# Patient Record
Sex: Female | Born: 1940 | Race: White | Hispanic: No | Marital: Married | Smoking: Current every day smoker
Health system: Southern US, Community
[De-identification: ages and names within clinical notes are randomized; demographics above are authoritative.]

## PROBLEM LIST (undated history)

## (undated) VITALS — BP 135/62 | HR 85 | Temp 98.7°F | Resp 17 | Wt 183.6 lb

## (undated) VITALS — BP 118/47 | HR 76 | Temp 98.1°F | Resp 16

## (undated) VITALS — BP 113/54 | HR 78 | Temp 98.1°F | Resp 16

## (undated) VITALS — BP 126/56 | HR 89 | Temp 98.8°F | Resp 26 | Wt 169.8 lb

## (undated) VITALS — BP 148/73 | HR 87 | Temp 98.4°F | Resp 22 | Wt 162.0 lb

## (undated) DIAGNOSIS — J449 Chronic obstructive pulmonary disease, unspecified: Secondary | ICD-10-CM

## (undated) DIAGNOSIS — R7881 Bacteremia: Secondary | ICD-10-CM

## (undated) DIAGNOSIS — F329 Major depressive disorder, single episode, unspecified: Secondary | ICD-10-CM

## (undated) DIAGNOSIS — D649 Anemia, unspecified: Secondary | ICD-10-CM

## (undated) DIAGNOSIS — Z720 Tobacco use: Secondary | ICD-10-CM

## (undated) DIAGNOSIS — I38 Endocarditis, valve unspecified: Secondary | ICD-10-CM

## (undated) DIAGNOSIS — R011 Cardiac murmur, unspecified: Secondary | ICD-10-CM

## (undated) DIAGNOSIS — Z5189 Encounter for other specified aftercare: Secondary | ICD-10-CM

## (undated) DIAGNOSIS — R410 Disorientation, unspecified: Secondary | ICD-10-CM

## (undated) DIAGNOSIS — F32A Depression, unspecified: Secondary | ICD-10-CM

## (undated) DIAGNOSIS — R899 Unspecified abnormal finding in specimens from other organs, systems and tissues: Principal | ICD-10-CM

## (undated) DIAGNOSIS — B370 Candidal stomatitis: Secondary | ICD-10-CM

## (undated) DIAGNOSIS — M549 Dorsalgia, unspecified: Secondary | ICD-10-CM

## (undated) DIAGNOSIS — J440 Chronic obstructive pulmonary disease with acute lower respiratory infection: Principal | ICD-10-CM

## (undated) DIAGNOSIS — M1990 Unspecified osteoarthritis, unspecified site: Secondary | ICD-10-CM

## (undated) DIAGNOSIS — G8929 Other chronic pain: Secondary | ICD-10-CM

## (undated) DIAGNOSIS — D759 Disease of blood and blood-forming organs, unspecified: Secondary | ICD-10-CM

## (undated) DIAGNOSIS — M48061 Spinal stenosis, lumbar region without neurogenic claudication: Secondary | ICD-10-CM

## (undated) DIAGNOSIS — E876 Hypokalemia: Secondary | ICD-10-CM

## (undated) DIAGNOSIS — Z9289 Personal history of other medical treatment: Secondary | ICD-10-CM

## (undated) DIAGNOSIS — IMO0001 Reserved for inherently not codable concepts without codable children: Secondary | ICD-10-CM

## (undated) DIAGNOSIS — G062 Extradural and subdural abscess, unspecified: Secondary | ICD-10-CM

## (undated) DIAGNOSIS — Z862 Personal history of diseases of the blood and blood-forming organs and certain disorders involving the immune mechanism: Secondary | ICD-10-CM

## (undated) HISTORY — PX: BACK SURGERY: SHX140

## (undated) HISTORY — DX: Chronic obstructive pulmonary disease with (acute) lower respiratory infection: J44.0

## (undated) HISTORY — DX: Unspecified abnormal finding in specimens from other organs, systems and tissues: R89.9

## (undated) HISTORY — PX: EYE SURGERY: SHX253

## (undated) HISTORY — DX: Tobacco use: Z72.0

## (undated) HISTORY — PX: TONSILLECTOMY: SUR1361

## (undated) HISTORY — PX: CATARACT EXTRACTION W/ INTRAOCULAR LENS  IMPLANT, BILATERAL: SHX1307

## (undated) HISTORY — PX: TUBAL LIGATION: SHX77

---

## 2003-05-22 ENCOUNTER — Other Ambulatory Visit: Payer: Self-pay

## 2003-06-02 ENCOUNTER — Encounter (HOSPITAL_COMMUNITY): Admission: RE | Admit: 2003-06-02 | Discharge: 2003-08-31 | Payer: Self-pay | Admitting: Oncology

## 2003-08-22 ENCOUNTER — Ambulatory Visit (HOSPITAL_COMMUNITY): Admission: RE | Admit: 2003-08-22 | Discharge: 2003-08-22 | Payer: Self-pay | Admitting: Thoracic Surgery

## 2004-03-08 ENCOUNTER — Ambulatory Visit: Payer: Self-pay | Admitting: Oncology

## 2004-08-30 ENCOUNTER — Ambulatory Visit: Payer: Self-pay | Admitting: Oncology

## 2004-11-05 ENCOUNTER — Ambulatory Visit: Payer: Self-pay

## 2004-12-08 ENCOUNTER — Ambulatory Visit: Payer: Self-pay | Admitting: Oncology

## 2004-12-08 ENCOUNTER — Inpatient Hospital Stay (HOSPITAL_COMMUNITY): Admission: AD | Admit: 2004-12-08 | Discharge: 2004-12-18 | Payer: Self-pay | Admitting: Oncology

## 2004-12-10 ENCOUNTER — Ambulatory Visit: Payer: Self-pay | Admitting: Oncology

## 2004-12-20 ENCOUNTER — Encounter (HOSPITAL_COMMUNITY): Admission: RE | Admit: 2004-12-20 | Discharge: 2005-02-17 | Payer: Self-pay | Admitting: Oncology

## 2005-01-18 ENCOUNTER — Ambulatory Visit: Payer: Self-pay | Admitting: Oncology

## 2005-01-26 ENCOUNTER — Ambulatory Visit: Payer: Self-pay | Admitting: Oncology

## 2005-02-06 ENCOUNTER — Other Ambulatory Visit: Payer: Self-pay

## 2005-02-06 ENCOUNTER — Observation Stay: Payer: Self-pay | Admitting: Internal Medicine

## 2005-02-15 ENCOUNTER — Encounter (HOSPITAL_COMMUNITY): Admission: RE | Admit: 2005-02-15 | Discharge: 2005-05-16 | Payer: Self-pay | Admitting: Oncology

## 2005-02-21 ENCOUNTER — Inpatient Hospital Stay (HOSPITAL_COMMUNITY): Admission: EM | Admit: 2005-02-21 | Discharge: 2005-02-22 | Payer: Self-pay | Admitting: *Deleted

## 2005-02-21 ENCOUNTER — Ambulatory Visit: Payer: Self-pay | Admitting: Oncology

## 2005-02-23 ENCOUNTER — Inpatient Hospital Stay (HOSPITAL_COMMUNITY): Admission: EM | Admit: 2005-02-23 | Discharge: 2005-02-28 | Payer: Self-pay | Admitting: Oncology

## 2005-03-02 ENCOUNTER — Inpatient Hospital Stay (HOSPITAL_COMMUNITY): Admission: AD | Admit: 2005-03-02 | Discharge: 2005-03-06 | Payer: Self-pay | Admitting: Oncology

## 2005-03-14 ENCOUNTER — Ambulatory Visit: Payer: Self-pay | Admitting: Oncology

## 2005-03-18 ENCOUNTER — Ambulatory Visit (HOSPITAL_COMMUNITY): Admission: RE | Admit: 2005-03-18 | Discharge: 2005-03-18 | Payer: Self-pay | Admitting: Oncology

## 2005-05-03 ENCOUNTER — Ambulatory Visit: Payer: Self-pay | Admitting: Oncology

## 2005-05-18 ENCOUNTER — Ambulatory Visit (HOSPITAL_COMMUNITY): Admission: RE | Admit: 2005-05-18 | Discharge: 2005-05-18 | Payer: Self-pay | Admitting: Vascular Surgery

## 2005-05-23 ENCOUNTER — Ambulatory Visit (HOSPITAL_COMMUNITY): Admission: RE | Admit: 2005-05-23 | Discharge: 2005-05-23 | Payer: Self-pay | Admitting: Vascular Surgery

## 2005-05-27 ENCOUNTER — Ambulatory Visit: Payer: Self-pay | Admitting: Oncology

## 2005-07-11 ENCOUNTER — Ambulatory Visit (HOSPITAL_COMMUNITY): Admission: RE | Admit: 2005-07-11 | Discharge: 2005-07-11 | Payer: Self-pay | Admitting: Oncology

## 2005-07-11 ENCOUNTER — Ambulatory Visit: Payer: Self-pay | Admitting: Oncology

## 2005-07-12 ENCOUNTER — Ambulatory Visit: Payer: Self-pay | Admitting: Psychiatry

## 2005-08-05 ENCOUNTER — Ambulatory Visit: Payer: Self-pay | Admitting: Oncology

## 2005-09-08 ENCOUNTER — Ambulatory Visit: Payer: Self-pay | Admitting: Oncology

## 2005-09-12 LAB — PROTIME-INR
INR: 1 — ABNORMAL LOW (ref 2.00–3.50)
Protime: 12.5 Seconds (ref 10.6–13.4)

## 2005-09-12 LAB — CBC & DIFF AND RETIC
BASO%: 0.5 % (ref 0.0–2.0)
EOS%: 0.7 % (ref 0.0–7.0)
LYMPH%: 14.6 % (ref 14.0–48.0)
MCH: 26.3 pg (ref 26.0–34.0)
MCHC: 33.6 g/dL (ref 32.0–36.0)
MCV: 78.1 fL — ABNORMAL LOW (ref 81.0–101.0)
MONO%: 6.1 % (ref 0.0–13.0)
NEUT#: 6.2 10*3/uL (ref 1.5–6.5)
RBC: 4.97 10*6/uL (ref 3.70–5.32)
RDW: 20.3 % — ABNORMAL HIGH (ref 11.3–14.5)
RETIC #: 98.4 10*3/uL (ref 19.7–115.1)
Retic %: 2 % (ref 0.4–2.3)

## 2005-09-12 LAB — COMPREHENSIVE METABOLIC PANEL
ALT: 20 U/L (ref 0–40)
AST: 18 U/L (ref 0–37)
Albumin: 4.6 g/dL (ref 3.5–5.2)
Alkaline Phosphatase: 79 U/L (ref 39–117)
Calcium: 10.3 mg/dL (ref 8.4–10.5)
Chloride: 104 mEq/L (ref 96–112)
Potassium: 4.8 mEq/L (ref 3.5–5.3)

## 2005-10-10 ENCOUNTER — Ambulatory Visit: Payer: Self-pay | Admitting: Oncology

## 2005-10-10 LAB — CBC WITH DIFFERENTIAL (CANCER CENTER ONLY)
BASO#: 0.1 10*3/uL (ref 0.0–0.2)
Eosinophils Absolute: 0.2 10*3/uL (ref 0.0–0.5)
HCT: 37.7 % (ref 34.8–46.6)
HGB: 12.4 g/dL (ref 11.6–15.9)
LYMPH#: 2.2 10*3/uL (ref 0.9–3.3)
MCH: 25.9 pg — ABNORMAL LOW (ref 26.0–34.0)
NEUT#: 2.3 10*3/uL (ref 1.5–6.5)
NEUT%: 43 % (ref 39.6–80.0)
RBC: 4.78 10*6/uL (ref 3.70–5.32)

## 2005-10-11 LAB — RETICULOCYTES (CHCC)
ABS Retic: 47.9 10*3/uL (ref 19.0–186.0)
RBC.: 4.79 MIL/uL (ref 3.87–5.11)
Retic Ct Pct: 1 % (ref 0.4–3.1)

## 2005-11-01 LAB — CBC WITH DIFFERENTIAL (CANCER CENTER ONLY)
BASO#: 0 10*3/uL (ref 0.0–0.2)
BASO%: 0.7 % (ref 0.0–2.0)
EOS%: 4.7 % (ref 0.0–7.0)
HCT: 38.4 % (ref 34.8–46.6)
HGB: 12.5 g/dL (ref 11.6–15.9)
LYMPH#: 2.1 10*3/uL (ref 0.9–3.3)
MCHC: 32.6 g/dL (ref 32.0–36.0)
MONO#: 0.3 10*3/uL (ref 0.1–0.9)
NEUT#: 2.9 10*3/uL (ref 1.5–6.5)
RBC: 4.58 10*6/uL (ref 3.70–5.32)
WBC: 5.6 10*3/uL (ref 3.9–10.0)

## 2005-11-01 LAB — COMPREHENSIVE METABOLIC PANEL
Alkaline Phosphatase: 79 U/L (ref 39–117)
Creatinine, Ser: 1.12 mg/dL (ref 0.40–1.20)
Glucose, Bld: 111 mg/dL — ABNORMAL HIGH (ref 70–99)
Sodium: 145 mEq/L (ref 135–145)
Total Bilirubin: 0.4 mg/dL (ref 0.3–1.2)
Total Protein: 7.2 g/dL (ref 6.0–8.3)

## 2005-11-01 LAB — RETICULOCYTES (CHCC)
ABS Retic: 50.5 10*3/uL (ref 19.0–186.0)
Retic Ct Pct: 1.1 % (ref 0.4–3.1)

## 2005-12-02 ENCOUNTER — Ambulatory Visit: Payer: Self-pay | Admitting: Oncology

## 2005-12-06 LAB — CBC WITH DIFFERENTIAL (CANCER CENTER ONLY)
BASO%: 1.1 % (ref 0.0–2.0)
EOS%: 3.8 % (ref 0.0–7.0)
HCT: 36.9 % (ref 34.8–46.6)
LYMPH%: 43.9 % (ref 14.0–48.0)
MCHC: 32.9 g/dL (ref 32.0–36.0)
MCV: 83 fL (ref 81–101)
MONO%: 7.4 % (ref 0.0–13.0)
NEUT%: 43.8 % (ref 39.6–80.0)
Platelets: 222 10*3/uL (ref 145–400)
RDW: 14.5 % (ref 10.5–14.6)
WBC: 6.9 10*3/uL (ref 3.9–10.0)

## 2005-12-15 ENCOUNTER — Ambulatory Visit: Payer: Self-pay

## 2006-01-03 LAB — CBC WITH DIFFERENTIAL (CANCER CENTER ONLY)
BASO%: 0.7 % (ref 0.0–2.0)
EOS%: 4.5 % (ref 0.0–7.0)
LYMPH#: 2.6 10*3/uL (ref 0.9–3.3)
MCHC: 33 g/dL (ref 32.0–36.0)
MONO#: 0.3 10*3/uL (ref 0.1–0.9)
NEUT#: 2.7 10*3/uL (ref 1.5–6.5)
NEUT%: 45.7 % (ref 39.6–80.0)
Platelets: 216 10*3/uL (ref 145–400)
RDW: 14.7 % — ABNORMAL HIGH (ref 10.5–14.6)
WBC: 5.9 10*3/uL (ref 3.9–10.0)

## 2006-01-09 ENCOUNTER — Ambulatory Visit: Payer: Self-pay | Admitting: Oncology

## 2006-06-14 ENCOUNTER — Ambulatory Visit: Payer: Self-pay | Admitting: Oncology

## 2006-06-14 LAB — CBC & DIFF AND RETIC
Basophils Absolute: 0 10*3/uL (ref 0.0–0.1)
EOS%: 4.4 % (ref 0.0–7.0)
Eosinophils Absolute: 0.3 10*3/uL (ref 0.0–0.5)
HGB: 15.1 g/dL (ref 11.6–15.9)
LYMPH%: 35.8 % (ref 14.0–48.0)
MCH: 31 pg (ref 26.0–34.0)
MCV: 87.1 fL (ref 81.0–101.0)
MONO%: 6.5 % (ref 0.0–13.0)
NEUT#: 4.1 10*3/uL (ref 1.5–6.5)
Platelets: 137 10*3/uL — ABNORMAL LOW (ref 145–400)
RBC: 4.88 10*6/uL (ref 3.70–5.32)
RDW: 14.1 % (ref 11.3–14.5)
RETIC #: 77.1 10*3/uL (ref 19.7–115.1)

## 2006-06-14 LAB — COMPREHENSIVE METABOLIC PANEL
Albumin: 4.6 g/dL (ref 3.5–5.2)
Alkaline Phosphatase: 68 U/L (ref 39–117)
BUN: 20 mg/dL (ref 6–23)
CO2: 25 mEq/L (ref 19–32)
Glucose, Bld: 114 mg/dL — ABNORMAL HIGH (ref 70–99)
Potassium: 4.9 mEq/L (ref 3.5–5.3)
Sodium: 142 mEq/L (ref 135–145)
Total Protein: 7.1 g/dL (ref 6.0–8.3)

## 2006-06-14 LAB — MORPHOLOGY: PLT EST: DECREASED

## 2006-06-14 LAB — LACTATE DEHYDROGENASE: LDH: 220 U/L (ref 94–250)

## 2006-06-20 ENCOUNTER — Ambulatory Visit: Payer: Self-pay | Admitting: Oncology

## 2006-06-21 LAB — CBC WITH DIFFERENTIAL (CANCER CENTER ONLY)
BASO#: 0.1 10*3/uL (ref 0.0–0.2)
Eosinophils Absolute: 0.4 10*3/uL (ref 0.0–0.5)
HGB: 15.6 g/dL (ref 11.6–15.9)
LYMPH%: 49.2 % — ABNORMAL HIGH (ref 14.0–48.0)
MCH: 30.9 pg (ref 26.0–34.0)
MCV: 90 fL (ref 81–101)
MONO%: 6 % (ref 0.0–13.0)
NEUT%: 37.7 % — ABNORMAL LOW (ref 39.6–80.0)
RBC: 5.04 10*6/uL (ref 3.70–5.32)

## 2006-06-21 LAB — RETICULOCYTES (CHCC): ABS Retic: 61.2 10*3/uL (ref 19.0–186.0)

## 2006-06-21 LAB — MORPHOLOGY - CHCC SATELLITE
Platelet Morphology: NORMAL
RBC Comments: NORMAL

## 2006-06-28 LAB — CBC WITH DIFFERENTIAL (CANCER CENTER ONLY)
BASO%: 0.7 % (ref 0.0–2.0)
Eosinophils Absolute: 0.5 10*3/uL (ref 0.0–0.5)
LYMPH%: 41.1 % (ref 14.0–48.0)
MCH: 30.9 pg (ref 26.0–34.0)
MCHC: 34.1 g/dL (ref 32.0–36.0)
MCV: 91 fL (ref 81–101)
MONO%: 5.3 % (ref 0.0–13.0)
Platelets: 131 10*3/uL — ABNORMAL LOW (ref 145–400)
RDW: 12.7 % (ref 10.5–14.6)

## 2006-06-28 LAB — MORPHOLOGY - CHCC SATELLITE
PLT EST ~~LOC~~: DECREASED
RBC Comments: NORMAL

## 2006-06-28 LAB — RETICULOCYTES (CHCC): RBC.: 4.99 MIL/uL (ref 3.87–5.11)

## 2006-07-03 LAB — CBC WITH DIFFERENTIAL/PLATELET
Basophils Absolute: 0 10*3/uL (ref 0.0–0.1)
EOS%: 5.6 % (ref 0.0–7.0)
Eosinophils Absolute: 0.4 10*3/uL (ref 0.0–0.5)
HCT: 42.4 % (ref 34.8–46.6)
HGB: 14.6 g/dL (ref 11.6–15.9)
LYMPH%: 40.1 % (ref 14.0–48.0)
MCH: 30.4 pg (ref 26.0–34.0)
MCV: 88.3 fL (ref 81.0–101.0)
MONO%: 7.5 % (ref 0.0–13.0)
NEUT#: 3.7 10*3/uL (ref 1.5–6.5)
NEUT%: 46.5 % (ref 39.6–76.8)
Platelets: 175 10*3/uL (ref 145–400)
RDW: 14 % (ref 11.3–14.5)

## 2006-07-03 LAB — LACTATE DEHYDROGENASE: LDH: 196 U/L (ref 94–250)

## 2006-07-03 LAB — COMPREHENSIVE METABOLIC PANEL
ALT: 12 U/L (ref 0–35)
AST: 16 U/L (ref 0–37)
Alkaline Phosphatase: 70 U/L (ref 39–117)
CO2: 29 mEq/L (ref 19–32)
Creatinine, Ser: 1.03 mg/dL (ref 0.40–1.20)
Total Bilirubin: 0.4 mg/dL (ref 0.3–1.2)

## 2006-07-03 LAB — MORPHOLOGY

## 2006-07-03 LAB — CHCC SMEAR

## 2006-07-18 ENCOUNTER — Ambulatory Visit: Payer: Self-pay | Admitting: Ophthalmology

## 2006-08-03 ENCOUNTER — Ambulatory Visit: Payer: Self-pay | Admitting: Oncology

## 2006-08-07 LAB — CBC WITH DIFFERENTIAL (CANCER CENTER ONLY)
BASO%: 0.9 % (ref 0.0–2.0)
LYMPH%: 44.5 % (ref 14.0–48.0)
MCV: 92 fL (ref 81–101)
MONO#: 0.4 10*3/uL (ref 0.1–0.9)
Platelets: 190 10*3/uL (ref 145–400)
RDW: 12.1 % (ref 10.5–14.6)
WBC: 7.5 10*3/uL (ref 3.9–10.0)

## 2006-08-07 LAB — RETICULOCYTES (CHCC): ABS Retic: 67.1 10*3/uL (ref 19.0–186.0)

## 2006-08-21 LAB — CBC WITH DIFFERENTIAL (CANCER CENTER ONLY)
Eosinophils Absolute: 0.6 10*3/uL — ABNORMAL HIGH (ref 0.0–0.5)
HCT: 44.8 % (ref 34.8–46.6)
HGB: 15.1 g/dL (ref 11.6–15.9)
LYMPH%: 41.6 % (ref 14.0–48.0)
MCV: 92 fL (ref 81–101)
MONO#: 0.4 10*3/uL (ref 0.1–0.9)
NEUT%: 44.4 % (ref 39.6–80.0)
RBC: 4.86 10*6/uL (ref 3.70–5.32)
WBC: 7.6 10*3/uL (ref 3.9–10.0)

## 2006-08-21 LAB — RETICULOCYTES (CHCC)
RBC.: 4.9 MIL/uL (ref 3.87–5.11)
Retic Ct Pct: 1.3 % (ref 0.4–3.1)

## 2006-09-04 LAB — RETICULOCYTES (CHCC): Retic Ct Pct: 1.4 % (ref 0.4–3.1)

## 2006-09-04 LAB — CBC WITH DIFFERENTIAL (CANCER CENTER ONLY)
BASO#: 0 10*3/uL (ref 0.0–0.2)
BASO%: 0.7 % (ref 0.0–2.0)
EOS%: 8.2 % — ABNORMAL HIGH (ref 0.0–7.0)
HCT: 44 % (ref 34.8–46.6)
HGB: 15.1 g/dL (ref 11.6–15.9)
LYMPH%: 41.2 % (ref 14.0–48.0)
MCHC: 34.2 g/dL (ref 32.0–36.0)
MCV: 92 fL (ref 81–101)
MONO#: 0.4 10*3/uL (ref 0.1–0.9)
NEUT%: 44.2 % (ref 39.6–80.0)
RDW: 12 % (ref 10.5–14.6)

## 2006-09-15 ENCOUNTER — Ambulatory Visit: Payer: Self-pay | Admitting: Oncology

## 2006-09-20 LAB — CBC WITH DIFFERENTIAL (CANCER CENTER ONLY)
BASO#: 0.1 10*3/uL (ref 0.0–0.2)
BASO%: 0.9 % (ref 0.0–2.0)
EOS%: 7.9 % — ABNORMAL HIGH (ref 0.0–7.0)
HCT: 45.7 % (ref 34.8–46.6)
LYMPH#: 2.9 10*3/uL (ref 0.9–3.3)
LYMPH%: 44.1 % (ref 14.0–48.0)
MCH: 31.2 pg (ref 26.0–34.0)
MCHC: 34.1 g/dL (ref 32.0–36.0)
MONO%: 6.2 % (ref 0.0–13.0)
NEUT%: 40.9 % (ref 39.6–80.0)
RDW: 11.9 % (ref 10.5–14.6)

## 2006-09-20 LAB — RETICULOCYTES (CHCC): RBC.: 5.13 MIL/uL — ABNORMAL HIGH (ref 3.87–5.11)

## 2006-10-02 LAB — CBC WITH DIFFERENTIAL (CANCER CENTER ONLY)
BASO%: 0.6 % (ref 0.0–2.0)
EOS%: 7.5 % — ABNORMAL HIGH (ref 0.0–7.0)
HGB: 15 g/dL (ref 11.6–15.9)
LYMPH#: 2.9 10*3/uL (ref 0.9–3.3)
MCH: 31.2 pg (ref 26.0–34.0)
MCHC: 34.2 g/dL (ref 32.0–36.0)
MONO%: 4.6 % (ref 0.0–13.0)
NEUT#: 2.3 10*3/uL (ref 1.5–6.5)
Platelets: 181 10*3/uL (ref 145–400)
RDW: 11.9 % (ref 10.5–14.6)

## 2006-10-02 LAB — RETICULOCYTES (CHCC)
ABS Retic: 53.6 10*3/uL (ref 19.0–186.0)
RBC.: 4.87 MIL/uL (ref 3.87–5.11)

## 2006-10-16 LAB — CBC WITH DIFFERENTIAL (CANCER CENTER ONLY)
BASO#: 0.1 10*3/uL (ref 0.0–0.2)
Eosinophils Absolute: 0.4 10*3/uL (ref 0.0–0.5)
HGB: 14.7 g/dL (ref 11.6–15.9)
LYMPH#: 3 10*3/uL (ref 0.9–3.3)
MONO%: 6.5 % (ref 0.0–13.0)
NEUT#: 2.6 10*3/uL (ref 1.5–6.5)
Platelets: 195 10*3/uL (ref 145–400)
RBC: 4.69 10*6/uL (ref 3.70–5.32)
WBC: 6.5 10*3/uL (ref 3.9–10.0)

## 2006-10-16 LAB — RETICULOCYTES (CHCC): ABS Retic: 52.4 10*3/uL (ref 19.0–186.0)

## 2006-10-26 ENCOUNTER — Ambulatory Visit: Payer: Self-pay | Admitting: Oncology

## 2006-10-30 LAB — CBC WITH DIFFERENTIAL (CANCER CENTER ONLY)
BASO#: 0.1 10*3/uL (ref 0.0–0.2)
Eosinophils Absolute: 0.4 10*3/uL (ref 0.0–0.5)
HGB: 14.8 g/dL (ref 11.6–15.9)
LYMPH%: 43.8 % (ref 14.0–48.0)
MCH: 31.4 pg (ref 26.0–34.0)
MCV: 92 fL (ref 81–101)
MONO#: 0.4 10*3/uL (ref 0.1–0.9)
MONO%: 6.6 % (ref 0.0–13.0)
NEUT#: 2.6 10*3/uL (ref 1.5–6.5)
Platelets: 218 10*3/uL (ref 145–400)
RBC: 4.71 10*6/uL (ref 3.70–5.32)
WBC: 6.2 10*3/uL (ref 3.9–10.0)

## 2006-11-13 ENCOUNTER — Ambulatory Visit: Payer: Self-pay | Admitting: Oncology

## 2006-11-13 LAB — COMPREHENSIVE METABOLIC PANEL
Alkaline Phosphatase: 52 U/L (ref 39–117)
BUN: 23 mg/dL (ref 6–23)
Creatinine, Ser: 1.06 mg/dL (ref 0.40–1.20)
Glucose, Bld: 88 mg/dL (ref 70–99)
Total Bilirubin: 0.5 mg/dL (ref 0.3–1.2)

## 2006-11-13 LAB — CBC & DIFF AND RETIC
Basophils Absolute: 0 10*3/uL (ref 0.0–0.1)
Eosinophils Absolute: 0.4 10*3/uL (ref 0.0–0.5)
HGB: 15.4 g/dL (ref 11.6–15.9)
IRF: 0.36 — ABNORMAL HIGH (ref 0.130–0.330)
MCV: 90.6 fL (ref 81.0–101.0)
MONO%: 7.7 % (ref 0.0–13.0)
NEUT#: 3 10*3/uL (ref 1.5–6.5)
RDW: 13.1 % (ref 11.3–14.5)
RETIC #: 70.9 10*3/uL (ref 19.7–115.1)

## 2006-11-13 LAB — MORPHOLOGY: RBC Comments: NORMAL

## 2006-11-13 LAB — CHCC SMEAR

## 2006-12-11 ENCOUNTER — Ambulatory Visit: Payer: Self-pay | Admitting: Pain Medicine

## 2006-12-14 ENCOUNTER — Ambulatory Visit: Payer: Self-pay | Admitting: Pain Medicine

## 2006-12-15 ENCOUNTER — Ambulatory Visit: Payer: Self-pay | Admitting: Pain Medicine

## 2007-01-09 ENCOUNTER — Ambulatory Visit: Payer: Self-pay | Admitting: Physician Assistant

## 2007-01-11 ENCOUNTER — Ambulatory Visit: Payer: Self-pay

## 2007-02-15 ENCOUNTER — Ambulatory Visit: Payer: Self-pay | Admitting: Oncology

## 2007-02-19 LAB — CBC & DIFF AND RETIC
BASO%: 0.5 % (ref 0.0–2.0)
Eosinophils Absolute: 0.5 10*3/uL (ref 0.0–0.5)
LYMPH%: 44.8 % (ref 14.0–48.0)
MCHC: 35.5 g/dL (ref 32.0–36.0)
MCV: 90.4 fL (ref 81.0–101.0)
MONO%: 4.9 % (ref 0.0–13.0)
Platelets: 197 10*3/uL (ref 145–400)
RBC: 4.53 10*6/uL (ref 3.70–5.32)
Retic %: 1.8 % (ref 0.4–2.3)

## 2007-02-19 LAB — MORPHOLOGY
PLT EST: ADEQUATE
RBC Comments: NORMAL

## 2007-03-21 ENCOUNTER — Ambulatory Visit: Payer: Self-pay | Admitting: Pain Medicine

## 2007-04-10 ENCOUNTER — Ambulatory Visit: Payer: Self-pay | Admitting: Pain Medicine

## 2007-05-17 ENCOUNTER — Ambulatory Visit: Payer: Self-pay | Admitting: Pain Medicine

## 2007-05-17 ENCOUNTER — Ambulatory Visit: Payer: Self-pay | Admitting: Oncology

## 2007-05-21 LAB — CBC & DIFF AND RETIC
BASO%: 0.4 % (ref 0.0–2.0)
HCT: 41.4 % (ref 34.8–46.6)
IRF: 0.42 — ABNORMAL HIGH (ref 0.130–0.330)
MCHC: 35.4 g/dL (ref 32.0–36.0)
MONO#: 0.9 10*3/uL (ref 0.1–0.9)
NEUT%: 58.8 % (ref 39.6–76.8)
RDW: 12.6 % (ref 11.3–14.5)
RETIC #: 96.4 10*3/uL (ref 19.7–115.1)
Retic %: 2.1 % (ref 0.4–2.3)
WBC: 9.2 10*3/uL (ref 3.9–10.0)
lymph#: 2.8 10*3/uL (ref 0.9–3.3)

## 2007-05-21 LAB — CHCC SMEAR

## 2007-05-21 LAB — COMPREHENSIVE METABOLIC PANEL
ALT: 14 U/L (ref 0–35)
AST: 13 U/L (ref 0–37)
Alkaline Phosphatase: 43 U/L (ref 39–117)
CO2: 23 mEq/L (ref 19–32)
Sodium: 140 mEq/L (ref 135–145)
Total Bilirubin: 0.5 mg/dL (ref 0.3–1.2)
Total Protein: 7.2 g/dL (ref 6.0–8.3)

## 2007-05-21 LAB — MORPHOLOGY

## 2007-05-31 ENCOUNTER — Ambulatory Visit: Payer: Self-pay | Admitting: Physician Assistant

## 2007-08-06 ENCOUNTER — Ambulatory Visit: Payer: Self-pay | Admitting: Pain Medicine

## 2007-08-07 ENCOUNTER — Ambulatory Visit: Payer: Self-pay | Admitting: Oncology

## 2007-08-09 ENCOUNTER — Ambulatory Visit: Payer: Self-pay | Admitting: Pain Medicine

## 2007-08-15 LAB — CBC WITH DIFFERENTIAL (CANCER CENTER ONLY)
Eosinophils Absolute: 0.3 10*3/uL (ref 0.0–0.5)
HCT: 44.2 % (ref 34.8–46.6)
LYMPH%: 38.1 % (ref 14.0–48.0)
MCH: 30.8 pg (ref 26.0–34.0)
MCV: 90 fL (ref 81–101)
MONO#: 0.6 10*3/uL (ref 0.1–0.9)
MONO%: 7.4 % (ref 0.0–13.0)
NEUT%: 49.7 % (ref 39.6–80.0)
Platelets: 218 10*3/uL (ref 145–400)
RBC: 4.89 10*6/uL (ref 3.70–5.32)
RDW: 11.7 % (ref 10.5–14.6)
WBC: 8.2 10*3/uL (ref 3.9–10.0)

## 2007-08-15 LAB — RETICULOCYTES (CHCC)
ABS Retic: 74.3 10*3/uL (ref 19.0–186.0)
Retic Ct Pct: 1.5 % (ref 0.4–3.1)

## 2007-08-15 LAB — MORPHOLOGY - CHCC SATELLITE
Platelet Morphology: NORMAL
RBC Comments: NORMAL

## 2007-08-23 ENCOUNTER — Ambulatory Visit: Payer: Self-pay | Admitting: Pain Medicine

## 2007-09-11 ENCOUNTER — Ambulatory Visit: Payer: Self-pay | Admitting: Physician Assistant

## 2007-10-29 ENCOUNTER — Ambulatory Visit: Payer: Self-pay | Admitting: Physician Assistant

## 2007-11-08 ENCOUNTER — Ambulatory Visit: Payer: Self-pay | Admitting: Oncology

## 2007-11-09 LAB — CBC WITH DIFFERENTIAL (CANCER CENTER ONLY)
BASO#: 0.1 10*3/uL (ref 0.0–0.2)
Eosinophils Absolute: 0.1 10*3/uL (ref 0.0–0.5)
HCT: 42.6 % (ref 34.8–46.6)
HGB: 14.4 g/dL (ref 11.6–15.9)
LYMPH%: 28.9 % (ref 14.0–48.0)
MCH: 30.3 pg (ref 26.0–34.0)
MCV: 90 fL (ref 81–101)
MONO#: 0.6 10*3/uL (ref 0.1–0.9)
MONO%: 6.5 % (ref 0.0–13.0)
RBC: 4.74 10*6/uL (ref 3.70–5.32)
WBC: 8.6 10*3/uL (ref 3.9–10.0)

## 2007-11-09 LAB — RETICULOCYTES (CHCC): Retic Ct Pct: 1.7 % (ref 0.4–3.1)

## 2007-11-09 LAB — MORPHOLOGY
PLT EST: ADEQUATE
RBC Comments: NORMAL

## 2007-11-27 ENCOUNTER — Ambulatory Visit: Payer: Self-pay | Admitting: Physician Assistant

## 2008-02-05 ENCOUNTER — Ambulatory Visit: Payer: Self-pay | Admitting: Family Medicine

## 2008-02-14 ENCOUNTER — Ambulatory Visit: Payer: Self-pay | Admitting: Oncology

## 2008-02-18 LAB — COMPREHENSIVE METABOLIC PANEL
CO2: 22 mEq/L (ref 19–32)
Creatinine, Ser: 0.91 mg/dL (ref 0.40–1.20)
Glucose, Bld: 89 mg/dL (ref 70–99)
Total Bilirubin: 0.5 mg/dL (ref 0.3–1.2)

## 2008-02-18 LAB — CBC & DIFF AND RETIC
BASO%: 0.5 % (ref 0.0–2.0)
EOS%: 4.5 % (ref 0.0–7.0)
HCT: 44 % (ref 34.8–46.6)
LYMPH%: 42.3 % (ref 14.0–48.0)
MCH: 32.3 pg (ref 26.0–34.0)
MCHC: 35.2 g/dL (ref 32.0–36.0)
NEUT%: 45.2 % (ref 39.6–76.8)
Platelets: 197 10*3/uL (ref 145–400)

## 2008-02-18 LAB — CHCC SMEAR

## 2008-02-18 LAB — MORPHOLOGY

## 2008-02-18 LAB — LACTATE DEHYDROGENASE: LDH: 174 U/L (ref 94–250)

## 2008-02-21 ENCOUNTER — Ambulatory Visit: Payer: Self-pay | Admitting: Physician Assistant

## 2008-04-08 ENCOUNTER — Ambulatory Visit: Payer: Self-pay | Admitting: Ophthalmology

## 2008-04-21 ENCOUNTER — Ambulatory Visit: Payer: Self-pay | Admitting: Ophthalmology

## 2008-05-22 ENCOUNTER — Ambulatory Visit: Payer: Self-pay | Admitting: Physician Assistant

## 2008-08-25 ENCOUNTER — Ambulatory Visit: Payer: Self-pay | Admitting: Physician Assistant

## 2008-08-26 ENCOUNTER — Ambulatory Visit: Payer: Self-pay | Admitting: Gastroenterology

## 2008-10-31 ENCOUNTER — Ambulatory Visit: Payer: Self-pay | Admitting: Unknown Physician Specialty

## 2008-11-20 ENCOUNTER — Ambulatory Visit: Payer: Self-pay | Admitting: Physician Assistant

## 2009-02-06 ENCOUNTER — Ambulatory Visit: Payer: Self-pay | Admitting: Family Medicine

## 2009-02-12 ENCOUNTER — Ambulatory Visit: Payer: Self-pay | Admitting: Oncology

## 2009-02-16 LAB — COMPREHENSIVE METABOLIC PANEL
AST: 17 U/L (ref 0–37)
BUN: 21 mg/dL (ref 6–23)
Calcium: 10.5 mg/dL (ref 8.4–10.5)
Chloride: 104 mEq/L (ref 96–112)
Creatinine, Ser: 1.05 mg/dL (ref 0.40–1.20)
Total Bilirubin: 0.4 mg/dL (ref 0.3–1.2)

## 2009-02-16 LAB — CBC & DIFF AND RETIC
BASO%: 0.6 % (ref 0.0–2.0)
EOS%: 4.4 % (ref 0.0–7.0)
MCH: 31.7 pg (ref 25.1–34.0)
MCHC: 34.1 g/dL (ref 31.5–36.0)
NEUT%: 48.7 % (ref 38.4–76.8)
RBC: 4.98 10*6/uL (ref 3.70–5.45)
RDW: 13.3 % (ref 11.2–14.5)
Retic %: 1.28 % (ref 0.50–1.50)
Retic Ct Abs: 63.74 10*3/uL (ref 18.30–72.70)
lymph#: 2.5 10*3/uL (ref 0.9–3.3)
nRBC: 0 % (ref 0–0)

## 2009-02-16 LAB — CHCC SMEAR

## 2009-02-16 LAB — LACTATE DEHYDROGENASE: LDH: 163 U/L (ref 94–250)

## 2009-02-16 LAB — MORPHOLOGY
PLT EST: ADEQUATE
RBC Comments: NORMAL

## 2009-02-19 ENCOUNTER — Ambulatory Visit: Payer: Self-pay | Admitting: Physician Assistant

## 2009-05-19 ENCOUNTER — Ambulatory Visit: Payer: Self-pay | Admitting: Physician Assistant

## 2009-06-11 ENCOUNTER — Ambulatory Visit: Payer: Self-pay | Admitting: Pain Medicine

## 2009-06-23 ENCOUNTER — Ambulatory Visit: Payer: Self-pay | Admitting: Pain Medicine

## 2009-09-10 ENCOUNTER — Ambulatory Visit: Payer: Self-pay | Admitting: Pain Medicine

## 2010-02-11 ENCOUNTER — Ambulatory Visit: Payer: Self-pay | Admitting: Oncology

## 2010-02-15 LAB — CBC & DIFF AND RETIC
BASO%: 0.6 % (ref 0.0–2.0)
EOS%: 5.7 % (ref 0.0–7.0)
Immature Retic Fract: 9.9 % (ref 0.00–10.70)
MCH: 31.9 pg (ref 25.1–34.0)
MCHC: 34.7 g/dL (ref 31.5–36.0)
NEUT%: 51.9 % (ref 38.4–76.8)
RDW: 13.2 % (ref 11.2–14.5)
Retic Ct Abs: 100.82 10*3/uL — ABNORMAL HIGH (ref 18.30–72.70)
lymph#: 2.7 10*3/uL (ref 0.9–3.3)

## 2010-02-15 LAB — CHCC SMEAR

## 2010-02-15 LAB — LACTATE DEHYDROGENASE: LDH: 338 U/L — ABNORMAL HIGH (ref 94–250)

## 2010-02-15 LAB — COMPREHENSIVE METABOLIC PANEL
ALT: 15 U/L (ref 0–35)
CO2: 27 mEq/L (ref 19–32)
Creatinine, Ser: 1.11 mg/dL (ref 0.40–1.20)
Total Bilirubin: 0.6 mg/dL (ref 0.3–1.2)

## 2010-02-15 LAB — MORPHOLOGY

## 2010-02-16 ENCOUNTER — Inpatient Hospital Stay (HOSPITAL_COMMUNITY): Admission: AD | Admit: 2010-02-16 | Discharge: 2010-02-18 | Payer: Self-pay | Admitting: Oncology

## 2010-02-16 ENCOUNTER — Ambulatory Visit: Payer: Self-pay | Admitting: Vascular Surgery

## 2010-02-16 ENCOUNTER — Ambulatory Visit: Payer: Self-pay | Admitting: Oncology

## 2010-02-19 ENCOUNTER — Encounter (HOSPITAL_COMMUNITY)
Admission: RE | Admit: 2010-02-19 | Discharge: 2010-05-20 | Payer: Self-pay | Source: Home / Self Care | Attending: Oncology | Admitting: Oncology

## 2010-03-15 ENCOUNTER — Ambulatory Visit: Payer: Self-pay | Admitting: Oncology

## 2010-03-16 LAB — CBC & DIFF AND RETIC
Basophils Absolute: 0 10*3/uL (ref 0.0–0.1)
EOS%: 0 % (ref 0.0–7.0)
Eosinophils Absolute: 0 10*3/uL (ref 0.0–0.5)
HCT: 31.9 % — ABNORMAL LOW (ref 34.8–46.6)
HGB: 10.3 g/dL — ABNORMAL LOW (ref 11.6–15.9)
LYMPH%: 8.2 % — ABNORMAL LOW (ref 14.0–49.7)
MCH: 31.7 pg (ref 25.1–34.0)
MCV: 98.2 fL (ref 79.5–101.0)
MONO%: 10.8 % (ref 0.0–14.0)
NEUT#: 7.3 10*3/uL — ABNORMAL HIGH (ref 1.5–6.5)
NEUT%: 80.9 % — ABNORMAL HIGH (ref 38.4–76.8)
Platelets: 162 10*3/uL (ref 145–400)
Retic Ct Abs: 111.8 10*3/uL — ABNORMAL HIGH (ref 18.30–72.70)

## 2010-03-16 LAB — MORPHOLOGY: PLT EST: ADEQUATE

## 2010-03-23 LAB — CBC & DIFF AND RETIC
Basophils Absolute: 0 10*3/uL (ref 0.0–0.1)
EOS%: 0 % (ref 0.0–7.0)
Eosinophils Absolute: 0 10*3/uL (ref 0.0–0.5)
HCT: 34.2 % — ABNORMAL LOW (ref 34.8–46.6)
HGB: 11.3 g/dL — ABNORMAL LOW (ref 11.6–15.9)
MCH: 31.5 pg (ref 25.1–34.0)
MCV: 95.3 fL (ref 79.5–101.0)
MONO%: 7.2 % (ref 0.0–14.0)
NEUT#: 7.4 10*3/uL — ABNORMAL HIGH (ref 1.5–6.5)
NEUT%: 84.7 % — ABNORMAL HIGH (ref 38.4–76.8)
RDW: 14 % (ref 11.2–14.5)
Retic %: 2.69 % — ABNORMAL HIGH (ref 0.50–1.50)
Retic Ct Abs: 96.57 10*3/uL — ABNORMAL HIGH (ref 18.30–72.70)

## 2010-03-29 ENCOUNTER — Ambulatory Visit (HOSPITAL_COMMUNITY): Admission: RE | Admit: 2010-03-29 | Discharge: 2010-03-29 | Payer: Self-pay | Admitting: Vascular Surgery

## 2010-03-29 ENCOUNTER — Ambulatory Visit: Payer: Self-pay | Admitting: Vascular Surgery

## 2010-03-30 LAB — CBC & DIFF AND RETIC
Basophils Absolute: 0 10*3/uL (ref 0.0–0.1)
Eosinophils Absolute: 0 10*3/uL (ref 0.0–0.5)
HGB: 12.4 g/dL (ref 11.6–15.9)
Immature Retic Fract: 16.6 % — ABNORMAL HIGH (ref 0.00–10.70)
MONO#: 0.7 10*3/uL (ref 0.1–0.9)
NEUT#: 8.6 10*3/uL — ABNORMAL HIGH (ref 1.5–6.5)
Platelets: 113 10*3/uL — ABNORMAL LOW (ref 145–400)
RBC: 4.02 10*6/uL (ref 3.70–5.45)
RDW: 14.1 % (ref 11.2–14.5)
Retic %: 3.13 % — ABNORMAL HIGH (ref 0.50–1.50)
WBC: 10.7 10*3/uL — ABNORMAL HIGH (ref 3.9–10.3)
nRBC: 0 % (ref 0–0)

## 2010-03-31 ENCOUNTER — Ambulatory Visit (HOSPITAL_COMMUNITY)
Admission: AD | Admit: 2010-03-31 | Discharge: 2010-03-31 | Payer: Self-pay | Source: Home / Self Care | Admitting: Vascular Surgery

## 2010-04-06 LAB — CBC & DIFF AND RETIC
BASO%: 0.1 % (ref 0.0–2.0)
EOS%: 0.9 % (ref 0.0–7.0)
MCH: 31.5 pg (ref 25.1–34.0)
MCHC: 32.6 g/dL (ref 31.5–36.0)
MONO%: 7.6 % (ref 0.0–14.0)
RDW: 14.4 % (ref 11.2–14.5)
Retic %: 3 % — ABNORMAL HIGH (ref 0.50–1.50)
Retic Ct Abs: 107.7 10*3/uL — ABNORMAL HIGH (ref 18.30–72.70)
lymph#: 2.2 10*3/uL (ref 0.9–3.3)

## 2010-04-06 LAB — CHCC SMEAR

## 2010-04-06 LAB — MORPHOLOGY: PLT EST: ADEQUATE

## 2010-04-09 ENCOUNTER — Emergency Department (HOSPITAL_COMMUNITY)
Admission: EM | Admit: 2010-04-09 | Discharge: 2010-04-09 | Payer: Self-pay | Source: Home / Self Care | Admitting: Emergency Medicine

## 2010-04-19 ENCOUNTER — Ambulatory Visit: Payer: Self-pay | Admitting: Oncology

## 2010-04-20 LAB — CBC & DIFF AND RETIC
BASO%: 0.1 % (ref 0.0–2.0)
Basophils Absolute: 0 10*3/uL (ref 0.0–0.1)
EOS%: 0.3 % (ref 0.0–7.0)
HGB: 10.5 g/dL — ABNORMAL LOW (ref 11.6–15.9)
Immature Retic Fract: 17.6 % — ABNORMAL HIGH (ref 0.00–10.70)
MCH: 29.9 pg (ref 25.1–34.0)
MONO%: 4.5 % (ref 0.0–14.0)
NEUT#: 10.8 10*3/uL — ABNORMAL HIGH (ref 1.5–6.5)
RBC: 3.51 10*6/uL — ABNORMAL LOW (ref 3.70–5.45)
RDW: 14.9 % — ABNORMAL HIGH (ref 11.2–14.5)
Retic %: 3.71 % — ABNORMAL HIGH (ref 0.50–1.50)
Retic Ct Abs: 130.22 10*3/uL — ABNORMAL HIGH (ref 18.30–72.70)
lymph#: 1 10*3/uL (ref 0.9–3.3)

## 2010-04-20 LAB — MORPHOLOGY: PLT EST: ADEQUATE

## 2010-04-20 LAB — CHCC SMEAR

## 2010-05-06 LAB — CBC
HCT: 33.5 % — ABNORMAL LOW (ref 36.0–46.0)
Hemoglobin: 10.3 g/dL — ABNORMAL LOW (ref 12.0–15.0)
MCH: 28.5 pg (ref 26.0–34.0)
MCHC: 30.7 g/dL (ref 30.0–36.0)
MCV: 92.5 fL (ref 78.0–100.0)
Platelets: 244 10*3/uL (ref 150–400)
RBC: 3.62 MIL/uL — ABNORMAL LOW (ref 3.87–5.11)
RDW: 15.2 % (ref 11.5–15.5)
WBC: 10 10*3/uL (ref 4.0–10.5)

## 2010-05-06 LAB — COMPREHENSIVE METABOLIC PANEL
ALT: 25 U/L (ref 0–35)
AST: 40 U/L — ABNORMAL HIGH (ref 0–37)
Albumin: 3.5 g/dL (ref 3.5–5.2)
Alkaline Phosphatase: 53 U/L (ref 39–117)
BUN: 29 mg/dL — ABNORMAL HIGH (ref 6–23)
CO2: 30 mEq/L (ref 19–32)
Calcium: 10.5 mg/dL (ref 8.4–10.5)
Chloride: 103 mEq/L (ref 96–112)
Creatinine, Ser: 1.24 mg/dL — ABNORMAL HIGH (ref 0.4–1.2)
GFR calc Af Amer: 52 mL/min — ABNORMAL LOW (ref >60)
GFR calc non Af Amer: 43 mL/min — ABNORMAL LOW (ref >60)
Glucose, Bld: 152 mg/dL — ABNORMAL HIGH (ref 70–99)
Potassium: 3.8 mEq/L (ref 3.5–5.1)
Sodium: 142 mEq/L (ref 135–145)
Total Bilirubin: 0.3 mg/dL (ref 0.3–1.2)
Total Protein: 6.1 g/dL (ref 6.0–8.3)

## 2010-05-06 LAB — RETICULOCYTES
RBC.: 3.62 MIL/uL — ABNORMAL LOW (ref 3.87–5.11)
Retic Count, Absolute: 119.5 10*3/uL (ref 19.0–186.0)
Retic Ct Pct: 3.3 % — ABNORMAL HIGH (ref 0.4–3.1)

## 2010-05-06 LAB — DIFFERENTIAL
Basophils Absolute: 0 10*3/uL (ref 0.0–0.1)
Basophils Relative: 0 % (ref 0–1)
Eosinophils Absolute: 0.1 10*3/uL (ref 0.0–0.7)
Eosinophils Relative: 1 % (ref 0–5)
Lymphocytes Relative: 5 % — ABNORMAL LOW (ref 12–46)
Lymphs Abs: 0.5 10*3/uL — ABNORMAL LOW (ref 0.7–4.0)
Monocytes Absolute: 0.5 10*3/uL (ref 0.1–1.0)
Monocytes Relative: 5 % (ref 3–12)
Neutro Abs: 8.9 10*3/uL — ABNORMAL HIGH (ref 1.7–7.7)
Neutrophils Relative %: 89 % — ABNORMAL HIGH (ref 43–77)

## 2010-05-06 LAB — LACTATE DEHYDROGENASE: LDH: 327 U/L — ABNORMAL HIGH (ref 94–250)

## 2010-05-07 LAB — THERAPEUTIC PLASMA EXCHANGE (BLOOD BANK)
Plasma Exchange: 2800
Unit division: 0
Unit division: 0
Unit division: 0
Unit division: 0
Unit division: 0
Unit division: 0
Unit division: 0
Unit division: 0
Unit division: 0

## 2010-05-17 LAB — RETICULOCYTES
RBC.: 3.47 MIL/uL — ABNORMAL LOW (ref 3.87–5.11)
RBC.: 3.27 MIL/uL — ABNORMAL LOW (ref 3.87–5.11)
Retic Count, Absolute: 86.8 10*3/uL (ref 19.0–186.0)
Retic Count, Absolute: 88.3 10*3/uL (ref 19.0–186.0)
Retic Ct Pct: 2.5 % (ref 0.4–3.1)
Retic Ct Pct: 2.7 % (ref 0.4–3.1)

## 2010-05-17 LAB — THERAPEUTIC PLASMA EXCHANGE (BLOOD BANK)
Plasma Exchange: 2800
Plasma Exchange: 2800
Unit division: 0
Unit division: 0
Unit division: 0
Unit division: 0
Unit division: 0
Unit division: 0
Unit division: 0
Unit division: 0
Unit division: 0
Unit division: 0
Unit division: 0
Unit division: 0
Unit division: 0
Unit division: 0
Unit division: 0
Unit division: 0
Unit division: 0
Unit division: 0
Unit division: 0

## 2010-05-17 LAB — CBC
HCT: 32.3 % — ABNORMAL LOW (ref 36.0–46.0)
HCT: 30.2 % — ABNORMAL LOW (ref 36.0–46.0)
Hemoglobin: 9.9 g/dL — ABNORMAL LOW (ref 12.0–15.0)
Hemoglobin: 9.3 g/dL — ABNORMAL LOW (ref 12.0–15.0)
MCH: 28.5 pg (ref 26.0–34.0)
MCH: 28.4 pg (ref 26.0–34.0)
MCHC: 30.7 g/dL (ref 30.0–36.0)
MCHC: 30.8 g/dL (ref 30.0–36.0)
MCV: 93.1 fL (ref 78.0–100.0)
MCV: 92.4 fL (ref 78.0–100.0)
Platelets: 223 10*3/uL (ref 150–400)
Platelets: 245 10*3/uL (ref 150–400)
RBC: 3.47 MIL/uL — ABNORMAL LOW (ref 3.87–5.11)
RBC: 3.27 MIL/uL — ABNORMAL LOW (ref 3.87–5.11)
RDW: 15.3 % (ref 11.5–15.5)
RDW: 15.3 % (ref 11.5–15.5)
WBC: 7.4 10*3/uL (ref 4.0–10.5)
WBC: 9.3 10*3/uL (ref 4.0–10.5)

## 2010-05-17 LAB — COMPREHENSIVE METABOLIC PANEL
ALT: 24 U/L (ref 0–35)
ALT: 25 U/L (ref 0–35)
AST: 30 U/L (ref 0–37)
AST: 30 U/L (ref 0–37)
Albumin: 3.2 g/dL — ABNORMAL LOW (ref 3.5–5.2)
Albumin: 3.4 g/dL — ABNORMAL LOW (ref 3.5–5.2)
Alkaline Phosphatase: 55 U/L (ref 39–117)
Alkaline Phosphatase: 45 U/L (ref 39–117)
BUN: 28 mg/dL — ABNORMAL HIGH (ref 6–23)
BUN: 19 mg/dL (ref 6–23)
CO2: 32 mEq/L (ref 19–32)
CO2: 28 mEq/L (ref 19–32)
Calcium: 9.7 mg/dL (ref 8.4–10.5)
Calcium: 9.6 mg/dL (ref 8.4–10.5)
Chloride: 104 mEq/L (ref 96–112)
Chloride: 100 mEq/L (ref 96–112)
Creatinine, Ser: 1.5 mg/dL — ABNORMAL HIGH (ref 0.4–1.2)
Creatinine, Ser: 1.12 mg/dL (ref 0.4–1.2)
GFR calc Af Amer: 42 mL/min — ABNORMAL LOW (ref >60)
GFR calc Af Amer: 59 mL/min — ABNORMAL LOW (ref >60)
GFR calc non Af Amer: 35 mL/min — ABNORMAL LOW (ref >60)
GFR calc non Af Amer: 49 mL/min — ABNORMAL LOW (ref >60)
Glucose, Bld: 127 mg/dL — ABNORMAL HIGH (ref 70–99)
Glucose, Bld: 155 mg/dL — ABNORMAL HIGH (ref 70–99)
Potassium: 3.9 mEq/L (ref 3.5–5.1)
Potassium: 4.6 mEq/L (ref 3.5–5.1)
Sodium: 146 mEq/L — ABNORMAL HIGH (ref 135–145)
Sodium: 143 mEq/L (ref 135–145)
Total Bilirubin: 0.4 mg/dL (ref 0.3–1.2)
Total Bilirubin: 0.5 mg/dL (ref 0.3–1.2)
Total Protein: 5.8 g/dL — ABNORMAL LOW (ref 6.0–8.3)
Total Protein: 5.9 g/dL — ABNORMAL LOW (ref 6.0–8.3)

## 2010-05-17 LAB — DIFFERENTIAL
Basophils Absolute: 0 10*3/uL (ref 0.0–0.1)
Basophils Absolute: 0 10*3/uL (ref 0.0–0.1)
Basophils Relative: 1 % (ref 0–1)
Basophils Relative: 0 % (ref 0–1)
Eosinophils Absolute: 0.2 10*3/uL (ref 0.0–0.7)
Eosinophils Absolute: 0 10*3/uL (ref 0.0–0.7)
Eosinophils Relative: 2 % (ref 0–5)
Eosinophils Relative: 0 % (ref 0–5)
Lymphocytes Relative: 25 % (ref 12–46)
Lymphocytes Relative: 5 % — ABNORMAL LOW (ref 12–46)
Lymphs Abs: 1.9 10*3/uL (ref 0.7–4.0)
Lymphs Abs: 0.5 10*3/uL — ABNORMAL LOW (ref 0.7–4.0)
Monocytes Absolute: 0.6 10*3/uL (ref 0.1–1.0)
Monocytes Absolute: 0.3 10*3/uL (ref 0.1–1.0)
Monocytes Relative: 8 % (ref 3–12)
Monocytes Relative: 4 % (ref 3–12)
Neutro Abs: 4.8 10*3/uL (ref 1.7–7.7)
Neutro Abs: 8.4 10*3/uL — ABNORMAL HIGH (ref 1.7–7.7)
Neutrophils Relative %: 65 % (ref 43–77)
Neutrophils Relative %: 91 % — ABNORMAL HIGH (ref 43–77)

## 2010-05-17 LAB — LACTATE DEHYDROGENASE
LDH: 283 U/L — ABNORMAL HIGH (ref 94–250)
LDH: 263 U/L — ABNORMAL HIGH (ref 94–250)

## 2010-05-18 LAB — CBC & DIFF AND RETIC
BASO%: 0.2 % (ref 0.0–2.0)
Basophils Absolute: 0 10*3/uL (ref 0.0–0.1)
EOS%: 1 % (ref 0.0–7.0)
Eosinophils Absolute: 0.1 10*3/uL (ref 0.0–0.5)
HCT: 32.3 % — ABNORMAL LOW (ref 34.8–46.6)
HGB: 9.9 g/dL — ABNORMAL LOW (ref 11.6–15.9)
Immature Retic Fract: 24.2 % — ABNORMAL HIGH (ref 0.00–10.70)
LYMPH%: 24.8 % (ref 14.0–49.7)
MCH: 27.7 pg (ref 25.1–34.0)
MCHC: 30.7 g/dL — ABNORMAL LOW (ref 31.5–36.0)
MCV: 90.5 fL (ref 79.5–101.0)
MONO#: 0.6 10*3/uL (ref 0.1–0.9)
MONO%: 7.4 % (ref 0.0–14.0)
NEUT#: 5.8 10*3/uL (ref 1.5–6.5)
NEUT%: 66.6 % (ref 38.4–76.8)
Platelets: 298 10*3/uL (ref 145–400)
RBC: 3.57 10*6/uL — ABNORMAL LOW (ref 3.70–5.45)
RDW: 15.4 % — ABNORMAL HIGH (ref 11.2–14.5)
Retic %: 3.35 % — ABNORMAL HIGH (ref 0.50–1.50)
Retic Ct Abs: 119.6 10*3/uL — ABNORMAL HIGH (ref 18.30–72.70)
WBC: 8.6 10*3/uL (ref 3.9–10.3)
lymph#: 2.1 10*3/uL (ref 0.9–3.3)
nRBC: 0 % (ref 0–0)

## 2010-05-18 LAB — CHCC SMEAR

## 2010-05-20 ENCOUNTER — Encounter (HOSPITAL_COMMUNITY)
Admission: RE | Admit: 2010-05-20 | Discharge: 2010-06-01 | Payer: Self-pay | Source: Home / Self Care | Attending: Oncology | Admitting: Oncology

## 2010-05-24 LAB — THERAPEUTIC PLASMA EXCHANGE (BLOOD BANK)
Unit division: 0
Unit division: 0
Unit division: 0
Unit division: 0
Unit division: 0

## 2010-05-24 LAB — COMPREHENSIVE METABOLIC PANEL
AST: 41 U/L — ABNORMAL HIGH (ref 0–37)
Albumin: 3.2 g/dL — ABNORMAL LOW (ref 3.5–5.2)
Alkaline Phosphatase: 48 U/L (ref 39–117)
BUN: 20 mg/dL (ref 6–23)
Chloride: 108 mEq/L (ref 96–112)
GFR calc Af Amer: >60 mL/min (ref >60)
Potassium: 4.1 mEq/L (ref 3.5–5.1)
Total Bilirubin: 0.3 mg/dL (ref 0.3–1.2)
Total Protein: 5.5 g/dL — ABNORMAL LOW (ref 6.0–8.3)

## 2010-05-24 LAB — DIFFERENTIAL
Basophils Absolute: 0 10*3/uL (ref 0.0–0.1)
Basophils Relative: 0 % (ref 0–1)
Eosinophils Absolute: 0.1 10*3/uL (ref 0.0–0.7)
Eosinophils Relative: 2 % (ref 0–5)
Lymphocytes Relative: 21 % (ref 12–46)
Lymphs Abs: 1.7 10*3/uL (ref 0.7–4.0)
Monocytes Absolute: 0.7 10*3/uL (ref 0.1–1.0)
Monocytes Relative: 8 % (ref 3–12)
Neutro Abs: 5.6 10*3/uL (ref 1.7–7.7)
Neutrophils Relative %: 69 % (ref 43–77)

## 2010-05-24 LAB — CBC
HCT: 29.4 % — ABNORMAL LOW (ref 36.0–46.0)
Hemoglobin: 8.7 g/dL — ABNORMAL LOW (ref 12.0–15.0)
MCV: 91.9 fL (ref 78.0–100.0)
RBC: 3.2 MIL/uL — ABNORMAL LOW (ref 3.87–5.11)
WBC: 8.1 10*3/uL (ref 4.0–10.5)

## 2010-05-24 LAB — RETICULOCYTES
RBC.: 3.2 MIL/uL — ABNORMAL LOW (ref 3.87–5.11)
Retic Count, Absolute: 67.2 10*3/uL (ref 19.0–186.0)

## 2010-05-27 LAB — COMPREHENSIVE METABOLIC PANEL
Albumin: 3.5 g/dL (ref 3.5–5.2)
Alkaline Phosphatase: 92 U/L (ref 39–117)
BUN: 17 mg/dL (ref 6–23)
Creatinine, Ser: 1.01 mg/dL (ref 0.4–1.2)
GFR calc Af Amer: >60 mL/min (ref >60)
Potassium: 4.6 mEq/L (ref 3.5–5.1)
Total Protein: 6.4 g/dL (ref 6.0–8.3)

## 2010-05-27 LAB — CBC
HCT: 25.4 % — ABNORMAL LOW (ref 36.0–46.0)
RBC: 2.82 MIL/uL — ABNORMAL LOW (ref 3.87–5.11)
RDW: 15.9 % — ABNORMAL HIGH (ref 11.5–15.5)
WBC: 6 10*3/uL (ref 4.0–10.5)

## 2010-05-27 LAB — DIFFERENTIAL
Basophils Absolute: 0 10*3/uL (ref 0.0–0.1)
Eosinophils Relative: 1 % (ref 0–5)
Lymphocytes Relative: 19 % (ref 12–46)
Neutro Abs: 4.3 10*3/uL (ref 1.7–7.7)
Neutrophils Relative %: 71 % (ref 43–77)

## 2010-05-27 LAB — RETICULOCYTES
RBC.: 2.82 MIL/uL — ABNORMAL LOW (ref 3.87–5.11)
Retic Count, Absolute: 59.2 10*3/uL (ref 19.0–186.0)

## 2010-05-28 LAB — THERAPEUTIC PLASMA EXCHANGE (BLOOD BANK)
Plasma Exchange: 2800
Unit division: 0
Unit division: 0
Unit division: 0
Unit division: 0
Unit division: 0

## 2010-05-28 LAB — CROSSMATCH
ABO/RH(D): O POS
Antibody Screen: NEGATIVE

## 2010-06-02 ENCOUNTER — Encounter (HOSPITAL_COMMUNITY): Admission: RE | Admit: 2010-06-02 | Payer: Medicare Other | Source: Ambulatory Visit | Admitting: Oncology

## 2010-06-02 ENCOUNTER — Ambulatory Visit (HOSPITAL_COMMUNITY)
Admission: RE | Admit: 2010-06-02 | Discharge: 2010-06-02 | Disposition: A | Discharge status: Home / Self Care | Payer: Medicare Other | Source: Ambulatory Visit | Attending: Oncology | Admitting: Oncology

## 2010-06-02 DIAGNOSIS — G7000 Myasthenia gravis without (acute) exacerbation: Secondary | ICD-10-CM | POA: Insufficient documentation

## 2010-06-04 LAB — CBC
Hemoglobin: 10 g/dL — ABNORMAL LOW (ref 12.0–15.0)
RBC: 3.89 MIL/uL (ref 3.87–5.11)
WBC: 4 10*3/uL (ref 4.0–10.5)

## 2010-06-04 LAB — DIFFERENTIAL
Eosinophils Relative: 3 % (ref 0–5)
Lymphocytes Relative: 17 % (ref 12–46)
Lymphs Abs: 0.7 10*3/uL (ref 0.7–4.0)

## 2010-06-07 ENCOUNTER — Ambulatory Visit (HOSPITAL_COMMUNITY)
Admission: RE | Admit: 2010-06-07 | Discharge: 2010-06-07 | Disposition: A | Discharge status: Home / Self Care | Payer: Medicare Other | Source: Ambulatory Visit | Attending: *Deleted | Admitting: *Deleted

## 2010-06-07 DIAGNOSIS — G7000 Myasthenia gravis without (acute) exacerbation: Secondary | ICD-10-CM | POA: Insufficient documentation

## 2010-06-09 ENCOUNTER — Ambulatory Visit (HOSPITAL_COMMUNITY)
Admission: RE | Admit: 2010-06-09 | Discharge: 2010-06-09 | Disposition: A | Discharge status: Home / Self Care | Payer: 59 | Source: Ambulatory Visit

## 2010-06-11 ENCOUNTER — Ambulatory Visit (HOSPITAL_COMMUNITY)
Admission: RE | Admit: 2010-06-11 | Discharge: 2010-06-11 | Disposition: A | Discharge status: Home / Self Care | Payer: 59 | Source: Ambulatory Visit

## 2010-06-11 LAB — DIFFERENTIAL
Basophils Relative: 2 % — ABNORMAL HIGH (ref 0–1)
Lymphocytes Relative: 40 % (ref 12–46)
Lymphs Abs: 2.2 10*3/uL (ref 0.7–4.0)
Monocytes Relative: 20 % — ABNORMAL HIGH (ref 3–12)
Neutro Abs: 2 10*3/uL (ref 1.7–7.7)
Neutrophils Relative %: 36 % — ABNORMAL LOW (ref 43–77)

## 2010-06-11 LAB — CBC
Hemoglobin: 10.7 g/dL — ABNORMAL LOW (ref 12.0–15.0)
MCH: 25.6 pg — ABNORMAL LOW (ref 26.0–34.0)
MCV: 84.7 fL (ref 78.0–100.0)
RBC: 4.18 MIL/uL (ref 3.87–5.11)

## 2010-06-14 ENCOUNTER — Ambulatory Visit (HOSPITAL_COMMUNITY)
Admission: RE | Admit: 2010-06-14 | Discharge: 2010-06-14 | Disposition: A | Discharge status: Home / Self Care | Payer: 59 | Source: Ambulatory Visit

## 2010-06-14 ENCOUNTER — Other Ambulatory Visit: Payer: Self-pay | Admitting: Oncology

## 2010-06-14 ENCOUNTER — Encounter (HOSPITAL_BASED_OUTPATIENT_CLINIC_OR_DEPARTMENT_OTHER): Payer: Medicare Other | Admitting: Oncology

## 2010-06-14 DIAGNOSIS — D6949 Other primary thrombocytopenia: Secondary | ICD-10-CM

## 2010-06-14 DIAGNOSIS — M311 Thrombotic microangiopathy: Secondary | ICD-10-CM

## 2010-06-14 LAB — COMPREHENSIVE METABOLIC PANEL
BUN: 17 mg/dL (ref 6–23)
CO2: 29 mEq/L (ref 19–32)
Creatinine, Ser: 1.09 mg/dL (ref 0.40–1.20)
Glucose, Bld: 150 mg/dL — ABNORMAL HIGH (ref 70–99)
Total Bilirubin: 0.3 mg/dL (ref 0.3–1.2)

## 2010-06-14 LAB — CBC WITH DIFFERENTIAL/PLATELET
BASO%: 0.6 % (ref 0.0–2.0)
Eosinophils Absolute: 0 10*3/uL (ref 0.0–0.5)
HCT: 35.2 % (ref 34.8–46.6)
LYMPH%: 20.3 % (ref 14.0–49.7)
MCHC: 31.9 g/dL (ref 31.5–36.0)
MONO#: 0.4 10*3/uL (ref 0.1–0.9)
NEUT%: 71.9 % (ref 38.4–76.8)
Platelets: 317 10*3/uL (ref 145–400)
WBC: 5.9 10*3/uL (ref 3.9–10.3)

## 2010-06-14 LAB — LACTATE DEHYDROGENASE: LDH: 271 U/L — ABNORMAL HIGH (ref 94–250)

## 2010-06-16 ENCOUNTER — Ambulatory Visit (HOSPITAL_COMMUNITY): Payer: 59

## 2010-06-18 ENCOUNTER — Encounter (HOSPITAL_COMMUNITY): Payer: Medicare Other

## 2010-06-18 ENCOUNTER — Ambulatory Visit (HOSPITAL_COMMUNITY)
Admission: RE | Admit: 2010-06-18 | Discharge: 2010-06-18 | Disposition: A | Discharge status: Home / Self Care | Payer: 59 | Source: Ambulatory Visit | Attending: Vascular Surgery | Admitting: Vascular Surgery

## 2010-06-18 DIAGNOSIS — I120 Hypertensive chronic kidney disease with stage 5 chronic kidney disease or end stage renal disease: Secondary | ICD-10-CM

## 2010-06-18 DIAGNOSIS — N186 End stage renal disease: Secondary | ICD-10-CM

## 2010-06-18 DIAGNOSIS — Z452 Encounter for adjustment and management of vascular access device: Secondary | ICD-10-CM

## 2010-06-21 ENCOUNTER — Ambulatory Visit (HOSPITAL_COMMUNITY): Payer: 59

## 2010-06-23 ENCOUNTER — Ambulatory Visit (HOSPITAL_COMMUNITY): Payer: 59

## 2010-06-25 ENCOUNTER — Ambulatory Visit (HOSPITAL_COMMUNITY): Payer: 59

## 2010-06-28 ENCOUNTER — Ambulatory Visit (HOSPITAL_COMMUNITY): Payer: 59

## 2010-06-30 ENCOUNTER — Ambulatory Visit (HOSPITAL_COMMUNITY): Payer: Medicare Other

## 2010-07-02 ENCOUNTER — Ambulatory Visit (HOSPITAL_COMMUNITY): Payer: Medicare (Managed Care) | Attending: Oncology

## 2010-07-02 DIAGNOSIS — G7000 Myasthenia gravis without (acute) exacerbation: Secondary | ICD-10-CM | POA: Insufficient documentation

## 2010-07-05 ENCOUNTER — Ambulatory Visit (HOSPITAL_COMMUNITY): Payer: Medicare (Managed Care)

## 2010-07-07 ENCOUNTER — Ambulatory Visit (HOSPITAL_COMMUNITY): Payer: Medicare (Managed Care)

## 2010-07-09 ENCOUNTER — Ambulatory Visit (HOSPITAL_COMMUNITY): Payer: Medicare (Managed Care)

## 2010-07-12 ENCOUNTER — Ambulatory Visit (HOSPITAL_COMMUNITY): Payer: Medicare (Managed Care)

## 2010-07-12 LAB — THERAPEUTIC PLASMA EXCHANGE (BLOOD BANK)
Plasma Exchange: 2800
Plasma Exchange: 2800
Plasma Exchange: 2800
Plasma Exchange: 2800
Plasma Exchange: 2800
Unit division: 0
Unit division: 0
Unit division: 0
Unit division: 0
Unit division: 0
Unit division: 0
Unit division: 0
Unit division: 0
Unit division: 0
Unit division: 0
Unit division: 0
Unit division: 0
Unit division: 0
Unit division: 0
Unit division: 0
Unit division: 0
Unit division: 0
Unit division: 0
Unit division: 0
Unit division: 0
Unit division: 0
Unit division: 0
Unit division: 0
Unit division: 0
Unit division: 0
Unit division: 0
Unit division: 0
Unit division: 0
Unit division: 0
Unit division: 0
Unit division: 0
Unit division: 0
Unit division: 0
Unit division: 0
Unit division: 0
Unit division: 0
Unit division: 0
Unit division: 0
Unit division: 0
Unit division: 0
Unit division: 0
Unit division: 0
Unit division: 0
Unit division: 0
Unit division: 0
Unit division: 0
Unit division: 0
Unit division: 0
Unit division: 0
Unit division: 0
Unit division: 0
Unit division: 0
Unit division: 0
Unit division: 0
Unit division: 0
Unit division: 0
Unit division: 0
Unit division: 0
Unit division: 0
Unit division: 0
Unit division: 0
Unit division: 0
Unit division: 0
Unit division: 0
Unit division: 0
Unit division: 0

## 2010-07-12 LAB — COMPREHENSIVE METABOLIC PANEL
ALT: 26 U/L (ref 0–35)
ALT: 22 U/L (ref 0–35)
ALT: 23 U/L (ref 0–35)
ALT: 33 U/L (ref 0–35)
AST: 23 U/L (ref 0–37)
AST: 24 U/L (ref 0–37)
AST: 31 U/L (ref 0–37)
AST: 31 U/L (ref 0–37)
AST: 33 U/L (ref 0–37)
Albumin: 3.3 g/dL — ABNORMAL LOW (ref 3.5–5.2)
Albumin: 3.4 g/dL — ABNORMAL LOW (ref 3.5–5.2)
Albumin: 3.7 g/dL (ref 3.5–5.2)
Albumin: 3.3 g/dL — ABNORMAL LOW (ref 3.5–5.2)
Albumin: 3.4 g/dL — ABNORMAL LOW (ref 3.5–5.2)
Albumin: 3.5 g/dL (ref 3.5–5.2)
Albumin: 3.5 g/dL (ref 3.5–5.2)
Albumin: 3.6 g/dL (ref 3.5–5.2)
Albumin: 3.8 g/dL (ref 3.5–5.2)
Alkaline Phosphatase: 47 U/L (ref 39–117)
Alkaline Phosphatase: 50 U/L (ref 39–117)
Alkaline Phosphatase: 51 U/L (ref 39–117)
Alkaline Phosphatase: 47 U/L (ref 39–117)
Alkaline Phosphatase: 49 U/L (ref 39–117)
Alkaline Phosphatase: 50 U/L (ref 39–117)
Alkaline Phosphatase: 61 U/L (ref 39–117)
Alkaline Phosphatase: 64 U/L (ref 39–117)
Alkaline Phosphatase: 65 U/L (ref 39–117)
BUN: 16 mg/dL (ref 6–23)
BUN: 24 mg/dL — ABNORMAL HIGH (ref 6–23)
BUN: 26 mg/dL — ABNORMAL HIGH (ref 6–23)
BUN: 36 mg/dL — ABNORMAL HIGH (ref 6–23)
BUN: 24 mg/dL — ABNORMAL HIGH (ref 6–23)
BUN: 24 mg/dL — ABNORMAL HIGH (ref 6–23)
BUN: 25 mg/dL — ABNORMAL HIGH (ref 6–23)
BUN: 26 mg/dL — ABNORMAL HIGH (ref 6–23)
BUN: 28 mg/dL — ABNORMAL HIGH (ref 6–23)
CO2: 26 mEq/L (ref 19–32)
CO2: 30 mEq/L (ref 19–32)
CO2: 27 mEq/L (ref 19–32)
CO2: 28 mEq/L (ref 19–32)
CO2: 30 mEq/L (ref 19–32)
CO2: 31 mEq/L (ref 19–32)
Calcium: 9.4 mg/dL (ref 8.4–10.5)
Calcium: 9.4 mg/dL (ref 8.4–10.5)
Calcium: 9.4 mg/dL (ref 8.4–10.5)
Calcium: 10.5 mg/dL (ref 8.4–10.5)
Calcium: 9.1 mg/dL (ref 8.4–10.5)
Calcium: 9.5 mg/dL (ref 8.4–10.5)
Chloride: 104 mEq/L (ref 96–112)
Chloride: 104 mEq/L (ref 96–112)
Chloride: 106 mEq/L (ref 96–112)
Chloride: 100 mEq/L (ref 96–112)
Chloride: 101 mEq/L (ref 96–112)
Chloride: 101 mEq/L (ref 96–112)
Chloride: 103 mEq/L (ref 96–112)
Chloride: 99 mEq/L (ref 96–112)
Creatinine, Ser: 0.89 mg/dL (ref 0.4–1.2)
Creatinine, Ser: 0.89 mg/dL (ref 0.4–1.2)
Creatinine, Ser: 1.01 mg/dL (ref 0.4–1.2)
Creatinine, Ser: 1.05 mg/dL (ref 0.4–1.2)
Creatinine, Ser: 1.38 mg/dL — ABNORMAL HIGH (ref 0.4–1.2)
GFR calc Af Amer: >60 mL/min (ref >60)
GFR calc Af Amer: 56 mL/min — ABNORMAL LOW (ref >60)
GFR calc Af Amer: 59 mL/min — ABNORMAL LOW (ref >60)
GFR calc Af Amer: >60 mL/min (ref >60)
GFR calc Af Amer: >60 mL/min (ref >60)
GFR calc non Af Amer: 59 mL/min — ABNORMAL LOW (ref >60)
GFR calc non Af Amer: >60 mL/min (ref >60)
GFR calc non Af Amer: 38 mL/min — ABNORMAL LOW (ref >60)
GFR calc non Af Amer: 52 mL/min — ABNORMAL LOW (ref >60)
GFR calc non Af Amer: 53 mL/min — ABNORMAL LOW (ref >60)
GFR calc non Af Amer: >60 mL/min (ref >60)
Glucose, Bld: 123 mg/dL — ABNORMAL HIGH (ref 70–99)
Glucose, Bld: 142 mg/dL — ABNORMAL HIGH (ref 70–99)
Glucose, Bld: 93 mg/dL (ref 70–99)
Glucose, Bld: 93 mg/dL (ref 70–99)
Glucose, Bld: 97 mg/dL (ref 70–99)
Glucose, Bld: 106 mg/dL — ABNORMAL HIGH (ref 70–99)
Glucose, Bld: 117 mg/dL — ABNORMAL HIGH (ref 70–99)
Glucose, Bld: 84 mg/dL (ref 70–99)
Potassium: 3.4 mEq/L — ABNORMAL LOW (ref 3.5–5.1)
Potassium: 3.9 mEq/L (ref 3.5–5.1)
Potassium: 4.2 mEq/L (ref 3.5–5.1)
Potassium: 3.4 mEq/L — ABNORMAL LOW (ref 3.5–5.1)
Potassium: 3.4 mEq/L — ABNORMAL LOW (ref 3.5–5.1)
Potassium: 3.5 mEq/L (ref 3.5–5.1)
Potassium: 3.5 mEq/L (ref 3.5–5.1)
Potassium: 3.7 mEq/L (ref 3.5–5.1)
Potassium: 4.3 mEq/L (ref 3.5–5.1)
Sodium: 139 mEq/L (ref 135–145)
Sodium: 138 mEq/L (ref 135–145)
Sodium: 138 mEq/L (ref 135–145)
Sodium: 141 mEq/L (ref 135–145)
Sodium: 142 mEq/L (ref 135–145)
Total Bilirubin: 0.3 mg/dL (ref 0.3–1.2)
Total Bilirubin: 0.3 mg/dL (ref 0.3–1.2)
Total Bilirubin: 0.5 mg/dL (ref 0.3–1.2)
Total Bilirubin: 0.5 mg/dL (ref 0.3–1.2)
Total Bilirubin: 0.3 mg/dL (ref 0.3–1.2)
Total Bilirubin: 0.4 mg/dL (ref 0.3–1.2)
Total Bilirubin: 0.5 mg/dL (ref 0.3–1.2)
Total Bilirubin: 0.5 mg/dL (ref 0.3–1.2)
Total Bilirubin: 0.6 mg/dL (ref 0.3–1.2)
Total Protein: 5.6 g/dL — ABNORMAL LOW (ref 6.0–8.3)
Total Protein: 5.8 g/dL — ABNORMAL LOW (ref 6.0–8.3)
Total Protein: 5.9 g/dL — ABNORMAL LOW (ref 6.0–8.3)
Total Protein: 5.9 g/dL — ABNORMAL LOW (ref 6.0–8.3)
Total Protein: 5.9 g/dL — ABNORMAL LOW (ref 6.0–8.3)
Total Protein: 6.1 g/dL (ref 6.0–8.3)
Total Protein: 6.2 g/dL (ref 6.0–8.3)
Total Protein: 6.5 g/dL (ref 6.0–8.3)

## 2010-07-12 LAB — CBC
HCT: 31.3 % — ABNORMAL LOW (ref 36.0–46.0)
HCT: 31.4 % — ABNORMAL LOW (ref 36.0–46.0)
HCT: 32.1 % — ABNORMAL LOW (ref 36.0–46.0)
HCT: 32.6 % — ABNORMAL LOW (ref 36.0–46.0)
HCT: 33.2 % — ABNORMAL LOW (ref 36.0–46.0)
HCT: 33.1 % — ABNORMAL LOW (ref 36.0–46.0)
HCT: 33.9 % — ABNORMAL LOW (ref 36.0–46.0)
HCT: 34.5 % — ABNORMAL LOW (ref 36.0–46.0)
HCT: 35.4 % — ABNORMAL LOW (ref 36.0–46.0)
HCT: 35.6 % — ABNORMAL LOW (ref 36.0–46.0)
Hemoglobin: 10 g/dL — ABNORMAL LOW (ref 12.0–15.0)
Hemoglobin: 10.2 g/dL — ABNORMAL LOW (ref 12.0–15.0)
Hemoglobin: 10.6 g/dL — ABNORMAL LOW (ref 12.0–15.0)
Hemoglobin: 10.7 g/dL — ABNORMAL LOW (ref 12.0–15.0)
Hemoglobin: 10.7 g/dL — ABNORMAL LOW (ref 12.0–15.0)
Hemoglobin: 11 g/dL — ABNORMAL LOW (ref 12.0–15.0)
Hemoglobin: 11.6 g/dL — ABNORMAL LOW (ref 12.0–15.0)
MCH: 28.7 pg (ref 26.0–34.0)
MCH: 29.2 pg (ref 26.0–34.0)
MCH: 29.4 pg (ref 26.0–34.0)
MCH: 30.1 pg (ref 26.0–34.0)
MCH: 30.8 pg (ref 26.0–34.0)
MCH: 30.7 pg (ref 26.0–34.0)
MCH: 31.1 pg (ref 26.0–34.0)
MCH: 31.4 pg (ref 26.0–34.0)
MCHC: 31.3 g/dL (ref 30.0–36.0)
MCHC: 31.9 g/dL (ref 30.0–36.0)
MCHC: 31.5 g/dL (ref 30.0–36.0)
MCHC: 32 g/dL (ref 30.0–36.0)
MCHC: 32.3 g/dL (ref 30.0–36.0)
MCHC: 32.6 g/dL (ref 30.0–36.0)
MCV: 93.3 fL (ref 78.0–100.0)
MCV: 93.6 fL (ref 78.0–100.0)
MCV: 94.4 fL (ref 78.0–100.0)
MCV: 94.9 fL (ref 78.0–100.0)
MCV: 96.2 fL (ref 78.0–100.0)
MCV: 97.1 fL (ref 78.0–100.0)
MCV: 97.7 fL (ref 78.0–100.0)
MCV: 98.3 fL (ref 78.0–100.0)
MCV: 98.3 fL (ref 78.0–100.0)
MCV: 98.5 fL (ref 78.0–100.0)
Platelets: 206 10*3/uL (ref 150–400)
Platelets: 219 10*3/uL (ref 150–400)
Platelets: 102 10*3/uL — ABNORMAL LOW (ref 150–400)
Platelets: 134 10*3/uL — ABNORMAL LOW (ref 150–400)
Platelets: 157 10*3/uL (ref 150–400)
Platelets: 268 10*3/uL (ref 150–400)
RBC: 3.31 MIL/uL — ABNORMAL LOW (ref 3.87–5.11)
RBC: 3.56 MIL/uL — ABNORMAL LOW (ref 3.87–5.11)
RBC: 3.44 MIL/uL — ABNORMAL LOW (ref 3.87–5.11)
RBC: 3.45 MIL/uL — ABNORMAL LOW (ref 3.87–5.11)
RBC: 3.51 MIL/uL — ABNORMAL LOW (ref 3.87–5.11)
RBC: 3.6 MIL/uL — ABNORMAL LOW (ref 3.87–5.11)
RBC: 4.02 MIL/uL (ref 3.87–5.11)
RDW: 14.8 % (ref 11.5–15.5)
RDW: 14.8 % (ref 11.5–15.5)
RDW: 14.9 % (ref 11.5–15.5)
RDW: 15 % (ref 11.5–15.5)
RDW: 15.1 % (ref 11.5–15.5)
RDW: 14.2 % (ref 11.5–15.5)
RDW: 14.6 % (ref 11.5–15.5)
WBC: 10 10*3/uL (ref 4.0–10.5)
WBC: 11.2 10*3/uL — ABNORMAL HIGH (ref 4.0–10.5)
WBC: 9.6 10*3/uL (ref 4.0–10.5)
WBC: 10 10*3/uL (ref 4.0–10.5)
WBC: 8.9 10*3/uL (ref 4.0–10.5)
WBC: 9.1 10*3/uL (ref 4.0–10.5)
WBC: 9.2 10*3/uL (ref 4.0–10.5)
WBC: 9.2 10*3/uL (ref 4.0–10.5)
WBC: 9.3 10*3/uL (ref 4.0–10.5)

## 2010-07-12 LAB — RETICULOCYTES
RBC.: 3.39 MIL/uL — ABNORMAL LOW (ref 3.87–5.11)
RBC.: 3.4 MIL/uL — ABNORMAL LOW (ref 3.87–5.11)
RBC.: 3.43 MIL/uL — ABNORMAL LOW (ref 3.87–5.11)
RBC.: 3.56 MIL/uL — ABNORMAL LOW (ref 3.87–5.11)
RBC.: 3.41 MIL/uL — ABNORMAL LOW (ref 3.87–5.11)
RBC.: 3.44 MIL/uL — ABNORMAL LOW (ref 3.87–5.11)
RBC.: 3.45 MIL/uL — ABNORMAL LOW (ref 3.87–5.11)
RBC.: 3.51 MIL/uL — ABNORMAL LOW (ref 3.87–5.11)
RBC.: 3.72 MIL/uL — ABNORMAL LOW (ref 3.87–5.11)
Retic Count, Absolute: 102 10*3/uL (ref 19.0–186.0)
Retic Count, Absolute: 112.2 10*3/uL (ref 19.0–186.0)
Retic Count, Absolute: 116.6 10*3/uL (ref 19.0–186.0)
Retic Count, Absolute: 117 10*3/uL (ref 19.0–186.0)
Retic Count, Absolute: 128.4 10*3/uL (ref 19.0–186.0)
Retic Count, Absolute: 89.7 10*3/uL (ref 19.0–186.0)
Retic Count, Absolute: 92.9 10*3/uL (ref 19.0–186.0)
Retic Count, Absolute: 96.7 K/uL (ref 19.0–186.0)
Retic Ct Pct: 3.1 % (ref 0.4–3.1)
Retic Ct Pct: 3.3 % — ABNORMAL HIGH (ref 0.4–3.1)
Retic Ct Pct: 3.4 % — ABNORMAL HIGH (ref 0.4–3.1)
Retic Ct Pct: 3.6 % — ABNORMAL HIGH (ref 0.4–3.1)
Retic Ct Pct: 4.2 % — ABNORMAL HIGH (ref 0.4–3.1)
Retic Ct Pct: 2.6 % (ref 0.4–3.1)
Retic Ct Pct: 2.6 % (ref 0.4–3.1)
Retic Ct Pct: 2.9 % (ref 0.4–3.1)
Retic Ct Pct: 3.6 % — ABNORMAL HIGH (ref 0.4–3.1)

## 2010-07-12 LAB — DIFFERENTIAL
Basophils Absolute: 0 10*3/uL (ref 0.0–0.1)
Basophils Absolute: 0 10*3/uL (ref 0.0–0.1)
Basophils Absolute: 0 10*3/uL (ref 0.0–0.1)
Basophils Absolute: 0 10*3/uL (ref 0.0–0.1)
Basophils Absolute: 0 10*3/uL (ref 0.0–0.1)
Basophils Absolute: 0 10*3/uL (ref 0.0–0.1)
Basophils Absolute: 0 10*3/uL (ref 0.0–0.1)
Basophils Absolute: 0 10*3/uL (ref 0.0–0.1)
Basophils Absolute: 0 10*3/uL (ref 0.0–0.1)
Basophils Absolute: 0 10*3/uL (ref 0.0–0.1)
Basophils Relative: 0 % (ref 0–1)
Basophils Relative: 0 % (ref 0–1)
Basophils Relative: 0 % (ref 0–1)
Basophils Relative: 0 % (ref 0–1)
Basophils Relative: 0 % (ref 0–1)
Basophils Relative: 0 % (ref 0–1)
Basophils Relative: 0 % (ref 0–1)
Basophils Relative: 0 % (ref 0–1)
Basophils Relative: 0 % (ref 0–1)
Eosinophils Absolute: 0.1 10*3/uL (ref 0.0–0.7)
Eosinophils Absolute: 0 10*3/uL (ref 0.0–0.7)
Eosinophils Absolute: 0.1 10*3/uL (ref 0.0–0.7)
Eosinophils Absolute: 0.1 10*3/uL (ref 0.0–0.7)
Eosinophils Relative: 1 % (ref 0–5)
Eosinophils Relative: 0 % (ref 0–5)
Eosinophils Relative: 0 % (ref 0–5)
Eosinophils Relative: 0 % (ref 0–5)
Eosinophils Relative: 1 % (ref 0–5)
Eosinophils Relative: 2 % (ref 0–5)
Eosinophils Relative: 2 % (ref 0–5)
Lymphocytes Relative: 11 % — ABNORMAL LOW (ref 12–46)
Lymphocytes Relative: 13 % (ref 12–46)
Lymphocytes Relative: 18 % (ref 12–46)
Lymphocytes Relative: 19 % (ref 12–46)
Lymphocytes Relative: 17 % (ref 12–46)
Lymphocytes Relative: 19 % (ref 12–46)
Lymphocytes Relative: 20 % (ref 12–46)
Lymphs Abs: 1.4 10*3/uL (ref 0.7–4.0)
Lymphs Abs: 1.9 10*3/uL (ref 0.7–4.0)
Lymphs Abs: 1.4 10*3/uL (ref 0.7–4.0)
Lymphs Abs: 1.6 10*3/uL (ref 0.7–4.0)
Lymphs Abs: 1.9 10*3/uL (ref 0.7–4.0)
Monocytes Absolute: 0.7 10*3/uL (ref 0.1–1.0)
Monocytes Absolute: 0.3 10*3/uL (ref 0.1–1.0)
Monocytes Absolute: 0.7 10*3/uL (ref 0.1–1.0)
Monocytes Absolute: 0.7 10*3/uL (ref 0.1–1.0)
Monocytes Absolute: 0.9 10*3/uL (ref 0.1–1.0)
Monocytes Absolute: 1 10*3/uL (ref 0.1–1.0)
Monocytes Absolute: 1.2 10*3/uL — ABNORMAL HIGH (ref 0.1–1.0)
Monocytes Relative: 10 % (ref 3–12)
Monocytes Relative: 6 % (ref 3–12)
Monocytes Relative: 3 % (ref 3–12)
Monocytes Relative: 8 % (ref 3–12)
Monocytes Relative: 9 % (ref 3–12)
Monocytes Relative: 9 % (ref 3–12)
Neutro Abs: 6 10*3/uL (ref 1.7–7.7)
Neutro Abs: 7.5 10*3/uL (ref 1.7–7.7)
Neutro Abs: 8.4 10*3/uL — ABNORMAL HIGH (ref 1.7–7.7)
Neutro Abs: 8.5 10*3/uL — ABNORMAL HIGH (ref 1.7–7.7)
Neutro Abs: 9.6 10*3/uL — ABNORMAL HIGH (ref 1.7–7.7)
Neutro Abs: 5.2 10*3/uL (ref 1.7–7.7)
Neutro Abs: 6 10*3/uL (ref 1.7–7.7)
Neutro Abs: 6.4 10*3/uL (ref 1.7–7.7)
Neutro Abs: 7.1 10*3/uL (ref 1.7–7.7)
Neutro Abs: 7.3 10*3/uL (ref 1.7–7.7)
Neutrophils Relative %: 71 % (ref 43–77)
Neutrophils Relative %: 72 % (ref 43–77)
Neutrophils Relative %: 74 % (ref 43–77)
Neutrophils Relative %: 78 % — ABNORMAL HIGH (ref 43–77)
Neutrophils Relative %: 82 % — ABNORMAL HIGH (ref 43–77)
Neutrophils Relative %: 85 % — ABNORMAL HIGH (ref 43–77)
Neutrophils Relative %: 68 % (ref 43–77)
Neutrophils Relative %: 71 % (ref 43–77)

## 2010-07-12 LAB — LACTATE DEHYDROGENASE
LDH: 271 U/L — ABNORMAL HIGH (ref 94–250)
LDH: 273 U/L — ABNORMAL HIGH (ref 94–250)
LDH: 273 U/L — ABNORMAL HIGH (ref 94–250)
LDH: 280 U/L — ABNORMAL HIGH (ref 94–250)
LDH: 207 U/L (ref 94–250)
LDH: 221 U/L (ref 94–250)
LDH: 224 U/L (ref 94–250)
LDH: 313 U/L — ABNORMAL HIGH (ref 94–250)

## 2010-07-12 LAB — URINALYSIS, MICROSCOPIC ONLY
Glucose, UA: NEGATIVE mg/dL
Hgb urine dipstick: NEGATIVE
Ketones, ur: NEGATIVE mg/dL
Leukocytes, UA: NEGATIVE
Protein, ur: NEGATIVE mg/dL
pH: 8.5 — ABNORMAL HIGH (ref 5.0–8.0)

## 2010-07-12 LAB — TYPE AND SCREEN
ABO/RH(D): O POS
Antibody Screen: NEGATIVE

## 2010-07-12 LAB — TRANSFUSION REACTION: Post RXN DAT IgG: NEGATIVE

## 2010-07-13 LAB — THERAPEUTIC PLASMA EXCHANGE (BLOOD BANK)
Plasma Exchange: 2800
Plasma Exchange: 2900
Plasma Exchange: 2900
Unit division: 0
Unit division: 0
Unit division: 0
Unit division: 0
Unit division: 0
Unit division: 0
Unit division: 0
Unit division: 0
Unit division: 0
Unit division: 0
Unit division: 0
Unit division: 0
Unit division: 0
Unit division: 0
Unit division: 0
Unit division: 0
Unit division: 0
Unit division: 0
Unit division: 0
Unit division: 0
Unit division: 0
Unit division: 0
Unit division: 0
Unit division: 0
Unit division: 0
Unit division: 0
Unit division: 0
Unit division: 0

## 2010-07-13 LAB — COMPREHENSIVE METABOLIC PANEL
ALT: 20 U/L (ref 0–35)
ALT: 23 U/L (ref 0–35)
ALT: 24 U/L (ref 0–35)
AST: 18 U/L (ref 0–37)
AST: 21 U/L (ref 0–37)
AST: 27 U/L (ref 0–37)
Albumin: 3.5 g/dL (ref 3.5–5.2)
Albumin: 3.6 g/dL (ref 3.5–5.2)
Alkaline Phosphatase: 63 U/L (ref 39–117)
BUN: 24 mg/dL — ABNORMAL HIGH (ref 6–23)
CO2: 27 mEq/L (ref 19–32)
CO2: 32 mEq/L (ref 19–32)
Calcium: 10.1 mg/dL (ref 8.4–10.5)
Calcium: 10.3 mg/dL (ref 8.4–10.5)
Calcium: 10.5 mg/dL (ref 8.4–10.5)
Calcium: 9.8 mg/dL (ref 8.4–10.5)
Chloride: 106 mEq/L (ref 96–112)
Creatinine, Ser: 1.1 mg/dL (ref 0.4–1.2)
Creatinine, Ser: 1.12 mg/dL (ref 0.4–1.2)
Creatinine, Ser: 1.18 mg/dL (ref 0.4–1.2)
Creatinine, Ser: 1.46 mg/dL — ABNORMAL HIGH (ref 0.4–1.2)
GFR calc Af Amer: 43 mL/min — ABNORMAL LOW (ref >60)
GFR calc Af Amer: 59 mL/min — ABNORMAL LOW (ref >60)
GFR calc Af Amer: 60 mL/min — ABNORMAL LOW (ref >60)
GFR calc non Af Amer: 36 mL/min — ABNORMAL LOW (ref >60)
GFR calc non Af Amer: 46 mL/min — ABNORMAL LOW (ref >60)
GFR calc non Af Amer: 50 mL/min — ABNORMAL LOW (ref >60)
Glucose, Bld: 133 mg/dL — ABNORMAL HIGH (ref 70–99)
Glucose, Bld: 140 mg/dL — ABNORMAL HIGH (ref 70–99)
Glucose, Bld: 88 mg/dL (ref 70–99)
Potassium: 3.6 mEq/L (ref 3.5–5.1)
Sodium: 139 mEq/L (ref 135–145)
Sodium: 140 mEq/L (ref 135–145)
Sodium: 142 mEq/L (ref 135–145)
Total Bilirubin: 0.4 mg/dL (ref 0.3–1.2)
Total Protein: 6 g/dL (ref 6.0–8.3)
Total Protein: 6.2 g/dL (ref 6.0–8.3)
Total Protein: 6.4 g/dL (ref 6.0–8.3)

## 2010-07-13 LAB — POCT I-STAT 4, (NA,K, GLUC, HGB,HCT): Hemoglobin: 13.9 g/dL (ref 12.0–15.0)

## 2010-07-13 LAB — CBC
HCT: 29.9 % — ABNORMAL LOW (ref 36.0–46.0)
HCT: 30.5 % — ABNORMAL LOW (ref 36.0–46.0)
Hemoglobin: 10 g/dL — ABNORMAL LOW (ref 12.0–15.0)
Hemoglobin: 9.8 g/dL — ABNORMAL LOW (ref 12.0–15.0)
MCH: 31.5 pg (ref 26.0–34.0)
MCH: 31.8 pg (ref 26.0–34.0)
MCH: 32.1 pg (ref 26.0–34.0)
MCHC: 32.6 g/dL (ref 30.0–36.0)
MCHC: 32.8 g/dL (ref 30.0–36.0)
MCHC: 32.8 g/dL (ref 30.0–36.0)
MCHC: 32.9 g/dL (ref 30.0–36.0)
MCV: 99.1 fL (ref 78.0–100.0)
Platelets: 172 10*3/uL (ref 150–400)
Platelets: 180 10*3/uL (ref 150–400)
RBC: 3.25 MIL/uL — ABNORMAL LOW (ref 3.87–5.11)
RDW: 14.4 % (ref 11.5–15.5)
RDW: 14.8 % (ref 11.5–15.5)
RDW: 14.9 % (ref 11.5–15.5)
WBC: 7.9 10*3/uL (ref 4.0–10.5)

## 2010-07-13 LAB — DIFFERENTIAL
Basophils Absolute: 0 10*3/uL (ref 0.0–0.1)
Basophils Relative: 0 % (ref 0–1)
Eosinophils Absolute: 0.3 10*3/uL (ref 0.0–0.7)
Eosinophils Absolute: 0.4 10*3/uL (ref 0.0–0.7)
Eosinophils Relative: 4 % (ref 0–5)
Eosinophils Relative: 5 % (ref 0–5)
Lymphocytes Relative: 14 % (ref 12–46)
Lymphocytes Relative: 30 % (ref 12–46)
Lymphocytes Relative: 38 % (ref 12–46)
Lymphocytes Relative: 43 % (ref 12–46)
Lymphs Abs: 1 10*3/uL (ref 0.7–4.0)
Lymphs Abs: 3.3 10*3/uL (ref 0.7–4.0)
Lymphs Abs: 3.3 10*3/uL (ref 0.7–4.0)
Lymphs Abs: 3.4 10*3/uL (ref 0.7–4.0)
Monocytes Absolute: 0.6 10*3/uL (ref 0.1–1.0)
Monocytes Absolute: 0.7 10*3/uL (ref 0.1–1.0)
Monocytes Relative: 7 % (ref 3–12)
Monocytes Relative: 7 % (ref 3–12)
Monocytes Relative: 8 % (ref 3–12)
Neutro Abs: 4.9 10*3/uL (ref 1.7–7.7)
Neutro Abs: 6.2 10*3/uL (ref 1.7–7.7)
Neutrophils Relative %: 47 % (ref 43–77)
Neutrophils Relative %: 51 % (ref 43–77)
Neutrophils Relative %: 82 % — ABNORMAL HIGH (ref 43–77)

## 2010-07-13 LAB — RETICULOCYTES
RBC.: 2.51 MIL/uL — ABNORMAL LOW (ref 3.87–5.11)
RBC.: 3.11 MIL/uL — ABNORMAL LOW (ref 3.87–5.11)
Retic Count, Absolute: 105.4 10*3/uL (ref 19.0–186.0)
Retic Count, Absolute: 90.2 10*3/uL (ref 19.0–186.0)
Retic Ct Pct: 2.9 % (ref 0.4–3.1)
Retic Ct Pct: 4.2 % — ABNORMAL HIGH (ref 0.4–3.1)
Retic Ct Pct: 4.9 % — ABNORMAL HIGH (ref 0.4–3.1)

## 2010-07-13 LAB — SURGICAL PCR SCREEN
MRSA, PCR: NEGATIVE
Staphylococcus aureus: NEGATIVE

## 2010-07-13 LAB — LACTATE DEHYDROGENASE
LDH: 172 U/L (ref 94–250)
LDH: 201 U/L (ref 94–250)
LDH: 217 U/L (ref 94–250)

## 2010-07-13 LAB — PHOSPHORUS: Phosphorus: 2.9 mg/dL (ref 2.3–4.6)

## 2010-07-14 ENCOUNTER — Ambulatory Visit (HOSPITAL_COMMUNITY): Payer: Medicare (Managed Care)

## 2010-07-14 LAB — DIFFERENTIAL
Basophils Absolute: 0 10*3/uL (ref 0.0–0.1)
Basophils Absolute: 0 10*3/uL (ref 0.0–0.1)
Basophils Absolute: 0 10*3/uL (ref 0.0–0.1)
Basophils Absolute: 0 10*3/uL (ref 0.0–0.1)
Basophils Absolute: 0 10*3/uL (ref 0.0–0.1)
Basophils Absolute: 0 10*3/uL (ref 0.0–0.1)
Basophils Absolute: 0 10*3/uL (ref 0.0–0.1)
Basophils Absolute: 0 10*3/uL (ref 0.0–0.1)
Basophils Relative: 0 % (ref 0–1)
Basophils Relative: 0 % (ref 0–1)
Basophils Relative: 0 % (ref 0–1)
Basophils Relative: 0 % (ref 0–1)
Basophils Relative: 0 % (ref 0–1)
Basophils Relative: 0 % (ref 0–1)
Basophils Relative: 0 % (ref 0–1)
Eosinophils Absolute: 0 10*3/uL (ref 0.0–0.7)
Eosinophils Absolute: 0 10*3/uL (ref 0.0–0.7)
Eosinophils Absolute: 0 10*3/uL (ref 0.0–0.7)
Eosinophils Absolute: 0 10*3/uL (ref 0.0–0.7)
Eosinophils Absolute: 0 10*3/uL (ref 0.0–0.7)
Eosinophils Absolute: 0 10*3/uL (ref 0.0–0.7)
Eosinophils Absolute: 0.1 10*3/uL (ref 0.0–0.7)
Eosinophils Absolute: 0.3 10*3/uL (ref 0.0–0.7)
Eosinophils Relative: 0 % (ref 0–5)
Eosinophils Relative: 0 % (ref 0–5)
Eosinophils Relative: 0 % (ref 0–5)
Eosinophils Relative: 2 % (ref 0–5)
Eosinophils Relative: 7 % — ABNORMAL HIGH (ref 0–5)
Lymphocytes Relative: 12 % (ref 12–46)
Lymphocytes Relative: 14 % (ref 12–46)
Lymphocytes Relative: 24 % (ref 12–46)
Lymphocytes Relative: 24 % (ref 12–46)
Lymphocytes Relative: 26 % (ref 12–46)
Lymphocytes Relative: 27 % (ref 12–46)
Lymphocytes Relative: 31 % (ref 12–46)
Lymphocytes Relative: 44 % (ref 12–46)
Lymphs Abs: 1.1 10*3/uL (ref 0.7–4.0)
Lymphs Abs: 2 10*3/uL (ref 0.7–4.0)
Lymphs Abs: 2.8 10*3/uL (ref 0.7–4.0)
Lymphs Abs: 3.2 10*3/uL (ref 0.7–4.0)
Lymphs Abs: 3.3 10*3/uL (ref 0.7–4.0)
Lymphs Abs: 3.3 10*3/uL (ref 0.7–4.0)
Lymphs Abs: 3.4 10*3/uL (ref 0.7–4.0)
Lymphs Abs: 3.4 10*3/uL (ref 0.7–4.0)
Lymphs Abs: 3.7 10*3/uL (ref 0.7–4.0)
Monocytes Absolute: 0.1 10*3/uL (ref 0.1–1.0)
Monocytes Absolute: 0.8 10*3/uL (ref 0.1–1.0)
Monocytes Absolute: 1 10*3/uL (ref 0.1–1.0)
Monocytes Absolute: 1.3 10*3/uL — ABNORMAL HIGH (ref 0.1–1.0)
Monocytes Relative: 1 % — ABNORMAL LOW (ref 3–12)
Monocytes Relative: 10 % (ref 3–12)
Monocytes Relative: 6 % (ref 3–12)
Monocytes Relative: 8 % (ref 3–12)
Monocytes Relative: 8 % (ref 3–12)
Monocytes Relative: 9 % (ref 3–12)
Neutro Abs: 11.7 10*3/uL — ABNORMAL HIGH (ref 1.7–7.7)
Neutro Abs: 5.3 10*3/uL (ref 1.7–7.7)
Neutro Abs: 7.2 10*3/uL (ref 1.7–7.7)
Neutro Abs: 7.7 10*3/uL (ref 1.7–7.7)
Neutro Abs: 8.2 10*3/uL — ABNORMAL HIGH (ref 1.7–7.7)
Neutro Abs: 8.2 10*3/uL — ABNORMAL HIGH (ref 1.7–7.7)
Neutro Abs: 8.8 10*3/uL — ABNORMAL HIGH (ref 1.7–7.7)
Neutrophils Relative %: 43 % (ref 43–77)
Neutrophils Relative %: 57 % (ref 43–77)
Neutrophils Relative %: 58 % (ref 43–77)
Neutrophils Relative %: 60 % (ref 43–77)
Neutrophils Relative %: 64 % (ref 43–77)
Neutrophils Relative %: 65 % (ref 43–77)
Neutrophils Relative %: 66 % (ref 43–77)
Neutrophils Relative %: 67 % (ref 43–77)
Neutrophils Relative %: 81 % — ABNORMAL HIGH (ref 43–77)
Neutrophils Relative %: 87 % — ABNORMAL HIGH (ref 43–77)

## 2010-07-14 LAB — THERAPEUTIC PLASMA EXCHANGE (BLOOD BANK)
Plasma Exchange: 2800
Plasma Exchange: 2800
Plasma Exchange: 2800
Plasma Exchange: 2800
Plasma Exchange: 2800
Plasma Exchange: 2850
Plasma Exchange: 2950
Unit division: 0
Unit division: 0
Unit division: 0
Unit division: 0
Unit division: 0
Unit division: 0
Unit division: 0
Unit division: 0
Unit division: 0
Unit division: 0
Unit division: 0
Unit division: 0
Unit division: 0
Unit division: 0
Unit division: 0
Unit division: 0
Unit division: 0
Unit division: 0
Unit division: 0
Unit division: 0
Unit division: 0
Unit division: 0
Unit division: 0
Unit division: 0
Unit division: 0
Unit division: 0
Unit division: 0
Unit division: 0
Unit division: 0
Unit division: 0
Unit division: 0
Unit division: 0
Unit division: 0
Unit division: 0
Unit division: 0
Unit division: 0
Unit division: 0
Unit division: 0
Unit division: 0
Unit division: 0
Unit division: 0
Unit division: 0
Unit division: 0
Unit division: 0
Unit division: 0
Unit division: 0
Unit division: 0
Unit division: 0
Unit division: 0
Unit division: 0
Unit division: 0
Unit division: 0
Unit division: 0
Unit division: 0
Unit division: 0
Unit division: 0

## 2010-07-14 LAB — COMPREHENSIVE METABOLIC PANEL
ALT: 20 U/L (ref 0–35)
ALT: 20 U/L (ref 0–35)
ALT: 22 U/L (ref 0–35)
ALT: 24 U/L (ref 0–35)
ALT: 27 U/L (ref 0–35)
ALT: 28 U/L (ref 0–35)
ALT: 30 U/L (ref 0–35)
AST: 21 U/L (ref 0–37)
AST: 22 U/L (ref 0–37)
AST: 23 U/L (ref 0–37)
AST: 24 U/L (ref 0–37)
AST: 27 U/L (ref 0–37)
AST: 28 U/L (ref 0–37)
Albumin: 3.2 g/dL — ABNORMAL LOW (ref 3.5–5.2)
Albumin: 3.3 g/dL — ABNORMAL LOW (ref 3.5–5.2)
Albumin: 3.4 g/dL — ABNORMAL LOW (ref 3.5–5.2)
Albumin: 3.5 g/dL (ref 3.5–5.2)
Albumin: 3.5 g/dL (ref 3.5–5.2)
Alkaline Phosphatase: 38 U/L — ABNORMAL LOW (ref 39–117)
Alkaline Phosphatase: 49 U/L (ref 39–117)
Alkaline Phosphatase: 51 U/L (ref 39–117)
Alkaline Phosphatase: 54 U/L (ref 39–117)
Alkaline Phosphatase: 54 U/L (ref 39–117)
Alkaline Phosphatase: 56 U/L (ref 39–117)
Alkaline Phosphatase: 69 U/L (ref 39–117)
BUN: 22 mg/dL (ref 6–23)
BUN: 23 mg/dL (ref 6–23)
BUN: 23 mg/dL (ref 6–23)
BUN: 24 mg/dL — ABNORMAL HIGH (ref 6–23)
BUN: 27 mg/dL — ABNORMAL HIGH (ref 6–23)
BUN: 27 mg/dL — ABNORMAL HIGH (ref 6–23)
BUN: 30 mg/dL — ABNORMAL HIGH (ref 6–23)
BUN: 31 mg/dL — ABNORMAL HIGH (ref 6–23)
CO2: 26 mEq/L (ref 19–32)
CO2: 27 mEq/L (ref 19–32)
CO2: 28 mEq/L (ref 19–32)
CO2: 28 mEq/L (ref 19–32)
CO2: 28 mEq/L (ref 19–32)
CO2: 30 mEq/L (ref 19–32)
CO2: 30 mEq/L (ref 19–32)
CO2: 31 mEq/L (ref 19–32)
Calcium: 8.7 mg/dL (ref 8.4–10.5)
Calcium: 9.2 mg/dL (ref 8.4–10.5)
Calcium: 9.5 mg/dL (ref 8.4–10.5)
Calcium: 9.5 mg/dL (ref 8.4–10.5)
Calcium: 9.9 mg/dL (ref 8.4–10.5)
Calcium: 9.9 mg/dL (ref 8.4–10.5)
Chloride: 102 mEq/L (ref 96–112)
Chloride: 103 mEq/L (ref 96–112)
Chloride: 104 mEq/L (ref 96–112)
Chloride: 105 mEq/L (ref 96–112)
Chloride: 106 mEq/L (ref 96–112)
Chloride: 106 mEq/L (ref 96–112)
Chloride: 107 mEq/L (ref 96–112)
Chloride: 107 mEq/L (ref 96–112)
Chloride: 108 mEq/L (ref 96–112)
Creatinine, Ser: 1.04 mg/dL (ref 0.4–1.2)
Creatinine, Ser: 1.05 mg/dL (ref 0.4–1.2)
Creatinine, Ser: 1.16 mg/dL (ref 0.4–1.2)
Creatinine, Ser: 1.19 mg/dL (ref 0.4–1.2)
Creatinine, Ser: 1.21 mg/dL — ABNORMAL HIGH (ref 0.4–1.2)
Creatinine, Ser: 1.25 mg/dL — ABNORMAL HIGH (ref 0.4–1.2)
Creatinine, Ser: 1.26 mg/dL — ABNORMAL HIGH (ref 0.4–1.2)
Creatinine, Ser: 1.27 mg/dL — ABNORMAL HIGH (ref 0.4–1.2)
GFR calc Af Amer: 51 mL/min — ABNORMAL LOW (ref >60)
GFR calc Af Amer: 51 mL/min — ABNORMAL LOW (ref >60)
GFR calc Af Amer: 59 mL/min — ABNORMAL LOW (ref >60)
GFR calc Af Amer: >60 mL/min (ref >60)
GFR calc non Af Amer: 38 mL/min — ABNORMAL LOW (ref >60)
GFR calc non Af Amer: 42 mL/min — ABNORMAL LOW (ref >60)
GFR calc non Af Amer: 42 mL/min — ABNORMAL LOW (ref >60)
GFR calc non Af Amer: 43 mL/min — ABNORMAL LOW (ref >60)
GFR calc non Af Amer: 45 mL/min — ABNORMAL LOW (ref >60)
GFR calc non Af Amer: 47 mL/min — ABNORMAL LOW (ref >60)
GFR calc non Af Amer: 49 mL/min — ABNORMAL LOW (ref >60)
GFR calc non Af Amer: 53 mL/min — ABNORMAL LOW (ref >60)
Glucose, Bld: 100 mg/dL — ABNORMAL HIGH (ref 70–99)
Glucose, Bld: 114 mg/dL — ABNORMAL HIGH (ref 70–99)
Glucose, Bld: 130 mg/dL — ABNORMAL HIGH (ref 70–99)
Glucose, Bld: 133 mg/dL — ABNORMAL HIGH (ref 70–99)
Glucose, Bld: 140 mg/dL — ABNORMAL HIGH (ref 70–99)
Glucose, Bld: 88 mg/dL (ref 70–99)
Glucose, Bld: 96 mg/dL (ref 70–99)
Glucose, Bld: 97 mg/dL (ref 70–99)
Glucose, Bld: 97 mg/dL (ref 70–99)
Potassium: 2.8 mEq/L — ABNORMAL LOW (ref 3.5–5.1)
Potassium: 3.2 mEq/L — ABNORMAL LOW (ref 3.5–5.1)
Potassium: 3.2 mEq/L — ABNORMAL LOW (ref 3.5–5.1)
Potassium: 3.5 mEq/L (ref 3.5–5.1)
Potassium: 3.9 mEq/L (ref 3.5–5.1)
Potassium: 3.9 mEq/L (ref 3.5–5.1)
Potassium: 4.2 mEq/L (ref 3.5–5.1)
Potassium: 4.4 mEq/L (ref 3.5–5.1)
Sodium: 139 mEq/L (ref 135–145)
Sodium: 139 mEq/L (ref 135–145)
Sodium: 140 mEq/L (ref 135–145)
Sodium: 142 mEq/L (ref 135–145)
Sodium: 142 mEq/L (ref 135–145)
Sodium: 142 mEq/L (ref 135–145)
Total Bilirubin: 0.4 mg/dL (ref 0.3–1.2)
Total Bilirubin: 0.4 mg/dL (ref 0.3–1.2)
Total Bilirubin: 0.5 mg/dL (ref 0.3–1.2)
Total Bilirubin: 0.5 mg/dL (ref 0.3–1.2)
Total Bilirubin: 0.5 mg/dL (ref 0.3–1.2)
Total Bilirubin: 0.5 mg/dL (ref 0.3–1.2)
Total Bilirubin: 0.5 mg/dL (ref 0.3–1.2)
Total Bilirubin: 0.5 mg/dL (ref 0.3–1.2)
Total Bilirubin: 0.6 mg/dL (ref 0.3–1.2)
Total Protein: 5.5 g/dL — ABNORMAL LOW (ref 6.0–8.3)
Total Protein: 5.5 g/dL — ABNORMAL LOW (ref 6.0–8.3)
Total Protein: 5.9 g/dL — ABNORMAL LOW (ref 6.0–8.3)
Total Protein: 6 g/dL (ref 6.0–8.3)
Total Protein: 6.1 g/dL (ref 6.0–8.3)
Total Protein: 6.1 g/dL (ref 6.0–8.3)
Total Protein: 6.2 g/dL (ref 6.0–8.3)

## 2010-07-14 LAB — CBC
HCT: 28.7 % — ABNORMAL LOW (ref 36.0–46.0)
HCT: 28.7 % — ABNORMAL LOW (ref 36.0–46.0)
HCT: 29.1 % — ABNORMAL LOW (ref 36.0–46.0)
HCT: 29.6 % — ABNORMAL LOW (ref 36.0–46.0)
HCT: 29.9 % — ABNORMAL LOW (ref 36.0–46.0)
HCT: 30 % — ABNORMAL LOW (ref 36.0–46.0)
HCT: 31.7 % — ABNORMAL LOW (ref 36.0–46.0)
HCT: 36.8 % (ref 36.0–46.0)
HCT: 38.4 % (ref 36.0–46.0)
Hemoglobin: 10.1 g/dL — ABNORMAL LOW (ref 12.0–15.0)
Hemoglobin: 10.3 g/dL — ABNORMAL LOW (ref 12.0–15.0)
Hemoglobin: 10.3 g/dL — ABNORMAL LOW (ref 12.0–15.0)
Hemoglobin: 10.4 g/dL — ABNORMAL LOW (ref 12.0–15.0)
Hemoglobin: 13.1 g/dL (ref 12.0–15.0)
Hemoglobin: 9.7 g/dL — ABNORMAL LOW (ref 12.0–15.0)
Hemoglobin: 9.7 g/dL — ABNORMAL LOW (ref 12.0–15.0)
Hemoglobin: 9.8 g/dL — ABNORMAL LOW (ref 12.0–15.0)
MCH: 31.1 pg (ref 26.0–34.0)
MCH: 31.2 pg (ref 26.0–34.0)
MCH: 31.2 pg (ref 26.0–34.0)
MCH: 31.4 pg (ref 26.0–34.0)
MCH: 31.7 pg (ref 26.0–34.0)
MCH: 31.8 pg (ref 26.0–34.0)
MCH: 32.1 pg (ref 26.0–34.0)
MCH: 32.2 pg (ref 26.0–34.0)
MCH: 32.4 pg (ref 26.0–34.0)
MCH: 32.5 pg (ref 26.0–34.0)
MCHC: 32.8 g/dL (ref 30.0–36.0)
MCHC: 33.8 g/dL (ref 30.0–36.0)
MCHC: 33.9 g/dL (ref 30.0–36.0)
MCHC: 34.1 g/dL (ref 30.0–36.0)
MCHC: 34.3 g/dL (ref 30.0–36.0)
MCV: 92.7 fL (ref 78.0–100.0)
MCV: 93 fL (ref 78.0–100.0)
MCV: 93.2 fL (ref 78.0–100.0)
MCV: 93.4 fL (ref 78.0–100.0)
MCV: 93.6 fL (ref 78.0–100.0)
MCV: 94.4 fL (ref 78.0–100.0)
MCV: 97.4 fL (ref 78.0–100.0)
MCV: 97.8 fL (ref 78.0–100.0)
Platelets: 103 10*3/uL — ABNORMAL LOW (ref 150–400)
Platelets: 121 10*3/uL — ABNORMAL LOW (ref 150–400)
Platelets: 167 10*3/uL (ref 150–400)
Platelets: 182 10*3/uL (ref 150–400)
Platelets: 83 10*3/uL — ABNORMAL LOW (ref 150–400)
RBC: 3.04 MIL/uL — ABNORMAL LOW (ref 3.87–5.11)
RBC: 3.08 MIL/uL — ABNORMAL LOW (ref 3.87–5.11)
RBC: 3.11 MIL/uL — ABNORMAL LOW (ref 3.87–5.11)
RBC: 3.14 MIL/uL — ABNORMAL LOW (ref 3.87–5.11)
RBC: 3.24 MIL/uL — ABNORMAL LOW (ref 3.87–5.11)
RBC: 3.31 MIL/uL — ABNORMAL LOW (ref 3.87–5.11)
RBC: 3.94 MIL/uL (ref 3.87–5.11)
RBC: 4.13 MIL/uL (ref 3.87–5.11)
RDW: 13.2 % (ref 11.5–15.5)
RDW: 13.8 % (ref 11.5–15.5)
RDW: 13.8 % (ref 11.5–15.5)
RDW: 14 % (ref 11.5–15.5)
RDW: 14.9 % (ref 11.5–15.5)
WBC: 10.8 10*3/uL — ABNORMAL HIGH (ref 4.0–10.5)
WBC: 11.6 10*3/uL — ABNORMAL HIGH (ref 4.0–10.5)
WBC: 12 10*3/uL — ABNORMAL HIGH (ref 4.0–10.5)
WBC: 12.2 10*3/uL — ABNORMAL HIGH (ref 4.0–10.5)
WBC: 12.7 10*3/uL — ABNORMAL HIGH (ref 4.0–10.5)
WBC: 12.9 10*3/uL — ABNORMAL HIGH (ref 4.0–10.5)
WBC: 13.4 10*3/uL — ABNORMAL HIGH (ref 4.0–10.5)
WBC: 14.5 10*3/uL — ABNORMAL HIGH (ref 4.0–10.5)
WBC: 9.3 10*3/uL (ref 4.0–10.5)
WBC: 9.4 10*3/uL (ref 4.0–10.5)

## 2010-07-14 LAB — RETICULOCYTES
RBC.: 2.92 MIL/uL — ABNORMAL LOW (ref 3.87–5.11)
RBC.: 3.01 MIL/uL — ABNORMAL LOW (ref 3.87–5.11)
RBC.: 3.13 MIL/uL — ABNORMAL LOW (ref 3.87–5.11)
RBC.: 3.84 MIL/uL — ABNORMAL LOW (ref 3.87–5.11)
Retic Count, Absolute: 100.1 10*3/uL (ref 19.0–186.0)
Retic Count, Absolute: 102 10*3/uL (ref 19.0–186.0)
Retic Count, Absolute: 130.6 10*3/uL (ref 19.0–186.0)
Retic Count, Absolute: 151.4 10*3/uL (ref 19.0–186.0)
Retic Count, Absolute: 153.5 10*3/uL (ref 19.0–186.0)
Retic Count, Absolute: 159.6 10*3/uL (ref 19.0–186.0)
Retic Count, Absolute: 90.4 10*3/uL (ref 19.0–186.0)
Retic Ct Pct: 2.4 % (ref 0.4–3.1)
Retic Ct Pct: 2.4 % (ref 0.4–3.1)
Retic Ct Pct: 3.3 % — ABNORMAL HIGH (ref 0.4–3.1)
Retic Ct Pct: 3.8 % — ABNORMAL HIGH (ref 0.4–3.1)
Retic Ct Pct: 4.4 % — ABNORMAL HIGH (ref 0.4–3.1)
Retic Ct Pct: 4.6 % — ABNORMAL HIGH (ref 0.4–3.1)
Retic Ct Pct: 4.9 % — ABNORMAL HIGH (ref 0.4–3.1)
Retic Ct Pct: 5.1 % — ABNORMAL HIGH (ref 0.4–3.1)
Retic Ct Pct: 5.1 % — ABNORMAL HIGH (ref 0.4–3.1)

## 2010-07-14 LAB — LACTATE DEHYDROGENASE
LDH: 180 U/L (ref 94–250)
LDH: 180 U/L (ref 94–250)
LDH: 181 U/L (ref 94–250)
LDH: 182 U/L (ref 94–250)
LDH: 185 U/L (ref 94–250)
LDH: 201 U/L (ref 94–250)
LDH: 232 U/L (ref 94–250)

## 2010-07-14 LAB — DIC (DISSEMINATED INTRAVASCULAR COAGULATION)PANEL
D-Dimer, Quant: 1.24 ug/mL-FEU — ABNORMAL HIGH (ref 0.00–0.48)
Prothrombin Time: 14 seconds (ref 11.6–15.2)
aPTT: 30 seconds (ref 24–37)

## 2010-07-14 LAB — URINALYSIS, ROUTINE W REFLEX MICROSCOPIC
Bilirubin Urine: NEGATIVE
Hgb urine dipstick: NEGATIVE
Ketones, ur: NEGATIVE mg/dL
Nitrite: NEGATIVE
Specific Gravity, Urine: 1.015 (ref 1.005–1.030)
pH: 7 (ref 5.0–8.0)

## 2010-07-14 LAB — HEPATITIS PANEL, ACUTE
Hep A IgM: NEGATIVE
Hep B C IgM: NEGATIVE
Hepatitis B Surface Ag: NEGATIVE

## 2010-07-14 LAB — PROTIME-INR: INR: 1.07 (ref 0.00–1.49)

## 2010-07-14 LAB — MISCELLANEOUS TEST

## 2010-07-14 LAB — HIV ANTIBODY (ROUTINE TESTING W REFLEX): HIV: NONREACTIVE

## 2010-07-16 ENCOUNTER — Ambulatory Visit (HOSPITAL_COMMUNITY): Payer: Medicare (Managed Care)

## 2010-07-19 ENCOUNTER — Ambulatory Visit (HOSPITAL_COMMUNITY): Payer: Medicare (Managed Care)

## 2010-07-21 ENCOUNTER — Ambulatory Visit (HOSPITAL_COMMUNITY): Payer: Medicare (Managed Care)

## 2010-07-23 ENCOUNTER — Ambulatory Visit (HOSPITAL_COMMUNITY): Payer: Medicare (Managed Care)

## 2010-07-26 ENCOUNTER — Ambulatory Visit (HOSPITAL_COMMUNITY): Payer: Medicare (Managed Care)

## 2010-07-28 ENCOUNTER — Ambulatory Visit (HOSPITAL_COMMUNITY): Payer: Medicare (Managed Care)

## 2010-07-30 ENCOUNTER — Ambulatory Visit (HOSPITAL_COMMUNITY): Payer: Medicare (Managed Care)

## 2010-08-02 ENCOUNTER — Ambulatory Visit (HOSPITAL_COMMUNITY): Payer: Medicare (Managed Care) | Attending: Oncology

## 2010-08-02 DIAGNOSIS — G7000 Myasthenia gravis without (acute) exacerbation: Secondary | ICD-10-CM | POA: Insufficient documentation

## 2010-08-04 ENCOUNTER — Ambulatory Visit (HOSPITAL_COMMUNITY): Payer: Medicare (Managed Care)

## 2010-08-06 ENCOUNTER — Ambulatory Visit (HOSPITAL_COMMUNITY): Payer: Medicare (Managed Care)

## 2010-08-09 ENCOUNTER — Ambulatory Visit (HOSPITAL_COMMUNITY): Payer: Medicare (Managed Care)

## 2010-08-09 ENCOUNTER — Encounter (HOSPITAL_BASED_OUTPATIENT_CLINIC_OR_DEPARTMENT_OTHER): Payer: Medicare (Managed Care) | Admitting: Oncology

## 2010-08-09 ENCOUNTER — Other Ambulatory Visit: Payer: Self-pay | Admitting: Oncology

## 2010-08-09 DIAGNOSIS — B370 Candidal stomatitis: Secondary | ICD-10-CM

## 2010-08-09 DIAGNOSIS — M311 Thrombotic microangiopathy: Secondary | ICD-10-CM

## 2010-08-09 DIAGNOSIS — D6949 Other primary thrombocytopenia: Secondary | ICD-10-CM

## 2010-08-09 DIAGNOSIS — M479 Spondylosis, unspecified: Secondary | ICD-10-CM

## 2010-08-09 LAB — COMPREHENSIVE METABOLIC PANEL
ALT: 10 U/L (ref 0–35)
AST: 19 U/L (ref 0–37)
Alkaline Phosphatase: 41 U/L (ref 39–117)
Calcium: 10.1 mg/dL (ref 8.4–10.5)
Chloride: 106 mEq/L (ref 96–112)
Creatinine, Ser: 0.93 mg/dL (ref 0.40–1.20)
Total Bilirubin: 0.3 mg/dL (ref 0.3–1.2)

## 2010-08-09 LAB — MORPHOLOGY: PLT EST: ADEQUATE

## 2010-08-09 LAB — CBC & DIFF AND RETIC
Basophils Absolute: 0.1 10*3/uL (ref 0.0–0.1)
EOS%: 4.8 % (ref 0.0–7.0)
HCT: 38.9 % (ref 34.8–46.6)
HGB: 12 g/dL (ref 11.6–15.9)
Immature Retic Fract: 6.5 % (ref 0.00–10.70)
MCH: 25.8 pg (ref 25.1–34.0)
MCV: 83.5 fL (ref 79.5–101.0)
MONO%: 9.3 % (ref 0.0–14.0)
NEUT%: 48 % (ref 38.4–76.8)
lymph#: 2.3 10*3/uL (ref 0.9–3.3)

## 2010-08-11 ENCOUNTER — Ambulatory Visit (HOSPITAL_COMMUNITY): Payer: Medicare (Managed Care)

## 2010-08-13 ENCOUNTER — Ambulatory Visit (HOSPITAL_COMMUNITY): Payer: Medicare (Managed Care)

## 2010-08-16 ENCOUNTER — Ambulatory Visit (HOSPITAL_COMMUNITY): Payer: Medicare (Managed Care)

## 2010-08-18 ENCOUNTER — Ambulatory Visit (HOSPITAL_COMMUNITY): Payer: Medicare (Managed Care)

## 2010-08-20 ENCOUNTER — Ambulatory Visit (HOSPITAL_COMMUNITY): Payer: Medicare (Managed Care)

## 2010-08-23 ENCOUNTER — Ambulatory Visit (HOSPITAL_COMMUNITY): Payer: Medicare (Managed Care)

## 2010-08-25 ENCOUNTER — Ambulatory Visit (HOSPITAL_COMMUNITY): Payer: Medicare (Managed Care)

## 2010-08-27 ENCOUNTER — Ambulatory Visit (HOSPITAL_COMMUNITY): Payer: Medicare (Managed Care)

## 2010-08-30 ENCOUNTER — Ambulatory Visit (HOSPITAL_COMMUNITY): Payer: Medicare (Managed Care)

## 2010-09-17 NOTE — Op Note (Signed)
[name], [name]             ACCOUNT NO.:  [id number]   MEDICAL RECORD NO.:  [id number]          PATIENT TYPE:  INP   LOCATION:  5712                         FACILITY:  [facility]   PHYSICIAN:  Ines Bloomer, M.D. DATE OF BIRTH:  11/16/1940   DATE OF PROCEDURE:  03/03/2005  DATE OF DISCHARGE:                                 OPERATIVE REPORT   PREOPERATIVE DIAGNOSIS:  Collapsing TTP.   POSTOPERATIVE DIAGNOSIS:  Collapsing TTP.   OPERATION PERFORMED:  Insertion of right internal jugular Diatek catheter.   SURGEON:  Ines Bloomer, M.D.   ANESTHESIA:  Local with intravenous sedation.   DESCRIPTION OF PROCEDURE:  After prepping and draping the right chest and  neck, the scanner was used to identify the right internal jugular and the  left internal jugular veins. They appeared to be patent and an area was  infiltrated with 1% Xylocaine superior to this and a right IJ puncture was  performed and a guidewire threaded under fluoroscopy to the right atrium.  A  stab wound was made around the guidewire.  Then over the guidewire was  passed a 12, 14 dilator and then a 15 dilator with a peel-away sheath.  The  guidewire and the dilator were removed and through the peel-away sheath was  passed the Diatek catheter and the peel-away sheath was removed.  Then  another area was infiltrated with 1% Xylocaine inferior to the clavicle.  Stab wound was made and a retrograde tunneling was done of the Diatek and  that was clamped, cut appropriately and the connecting device added and it  flushed and withdrew easily. It was in the right atrium, both the proximal  and distal limbs.  It was sutured in place with 3-0 nylon onto the cuff and  then at the proximal stab wound was closed with 3-0 nylon.  Dry sterile  dressing was applied.  The patient was then transferred to the recovery room  in stable condition.March 02, 2005           ______________________________  Ines Bloomer,  M.D.     DPB/MEDQ  D:  03/03/2005  T:  03/03/2005  Job:  [id number]

## 2010-09-17 NOTE — Discharge Summary (Signed)
NAMEMICKELLE, GOUPIL             ACCOUNT NO.:  [id number]   MEDICAL RECORD NO.:  [id number]          PATIENT TYPE:  INP   LOCATION:  5712                         FACILITY:  [facility]   PHYSICIAN:  Genene Churn. Granfortuna, M.D.DATE OF BIRTH:  11/19/1940   DATE OF ADMISSION:  03/02/2005  DATE OF DISCHARGE:  03/06/2005                                 DISCHARGE SUMMARY   [name] is a 70 year old woman initially diagnosed with TTP in [id number].  She was induced into remission with plasma exchange, steroids, and  antiplatelet agents. Unfortunately, she had a relapse in August of this  year. Although she had an initial prompt response to the reinitiation of  plasma exchange and steroids, she has not been able to hold response on  attempts to taper her off the plasma exchange program. She was given a  course of Rituxan anti B-cell antibody weekly x4 beginning in on September 7  through January 28, 2005. When platelet count began to fall by February 15, 2005, she was started on Imuran 100 milligrams daily. Coincidental with the  initiation of the Imuran, she developed persistent nausea. This was  initially felt to be a medication reaction. She was admitted to [facility]. It became obvious that she was likely infected. She developed low-  grade fever and shaking chills. Her vascular catheter was removed done  February 24, 2005. She was started on parenteral vancomycin as an outpatient.  A PICC catheter was placed to continue the vancomycin. She was discharged on  February 28, 2005 with an additional eight days of vancomycin planned. Follow-  up blood counts in our office showed a platelet count drifting downward  since last plasma exchange of February 23, 2005. She was admitted at this  time to have a new vascular catheter placed and to resume plasmapheresis.   Her initial exam, pulse 106, regular respirations 16, blood pressure 117/68,  temperature 99.1. Physical exam was normal. No  focal neurologic deficits.  PICC site left arm without erythema or exudate.   HOSPITAL COURSE:  Cardiovascular surgery consultation was obtained. A new  vascular catheter was placed by Dr. Edwyna Shell on March 03, 2005. By that  point, her platelet count had drifted down to 80,000 and her serum LDH was  rising up to approximately 650. She was started back on plasmapheresis.  Platelet count down to 58,000 by March 04, 2005. At that point, I  recommended splenectomy to the patient due to her poor response to the  plasma exchange which she now had almost continuously for three months. She  was given the requisite vaccines in anticipation of the splenectomy with  hemophilus influenza, meningitis, and pneumococcus. (Meningitis vaccine was  on back order and not received.) I asked one of our general surgeons to talk  with the patient. However, she became increasingly anxious about the  prospect of any surgery.   Platelet count did start to slowly come up with resumption of plasmapheresis  and was 75,000 by March 06, 2005. The patient elected to sign out of the  hospital against medical advice at that time to continue  plasmapheresis as  an outpatient. Interventions by my partners and by care management were  unsuccessful in convincing the patient to stay in the hospital over the  weekend. She went home in stable condition against our advice.   CONSULTATIONS:  Cardiovascular surgery and general surgery.   PROCEDURES:  1.  Placement of a Hickman catheter.  2.  Blood transfusion.  3.  Plasma exchange for relapsed TTP.   DIAGNOSES:  1.  Relapsed thrombotic thrombocytopenic purpura.  2.  Tobacco addiction.  3.  Anxiety and depression.  4.  Degenerative arthritis of the spine.  5.  Essential hypertension.  6.  History of kidney stones.  7.  History of hyperlipidemia.   DISPOSITION:  The patient signed out against medical advice. She told my  coverage that she would call our office on  Monday.   DISCHARGE MEDICATIONS:  1.  Prozac 20 milligrams daily.  2.  Prednisone 20 milligrams daily.  3.  Folic acid 1 milligram daily.  4.  Protonix 40 milligrams daily.  5.  K-Dur 20 mEq daily.  6.  Mycelex Troche t.i.d.  7.  Xanax 0.25 mg q.6h. p.r.n.      Genene Churn. Cyndie Chime, M.D.  Electronically Signed     JMG/MEDQ  D:  04/18/2005  T:  04/19/2005  Job:  [id number]   cc:   Adolph Pollack, M.D.  [address], [address]  [place]  [place] [address]   Ines Bloomer, M.D.  [address]  [place]  [place] [address]

## 2010-09-17 NOTE — H&P (Signed)
[name], [name]             ACCOUNT NO.:  [id number]   MEDICAL RECORD NO.:  [id number]          PATIENT TYPE:  INP   LOCATION:  5509                         FACILITY:  [facility]   PHYSICIAN:  Genene Churn. Granfortuna, M.D.DATE OF BIRTH:  06/07/1940   DATE OF ADMISSION:  12/08/2004  DATE OF DISCHARGE:                                HISTORY & PHYSICAL   REASON FOR ADMISSION:  Relapsed TTP.   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  She was initially treated at [facility]  with steroids, anti-platelet agents and plasmapheresis.  Her treatment was  continued in [place] once she was stable.  She has been monitored  closely.  Laboratory work done on Aug 31, 2004, showed a hemoglobin of 13,  hematocrit of 38, white count 7000, platelet count 203,000.  LDH 188.  Creatinine 1.2.  Bilirubin 0.7.   [name] had labs done on December 07, 2004, by her primary care Nastacia Raybuck with  a hemoglobin returning at 11.2 and platelet count of 15,000.  She presents  to the office today for further evaluation.  [name] reports an  approximate two-week history of spontaneous bruising, a one-week history of  headaches and a single pre-syncopal episode with transient dysarthria/left  facial droop, occurring one week ago.  Labs obtained in the office today  showed a hemoglobin of 10, platelet count 24,000, white count 6700, a  reticulocyte of 4.17%.  Creatinine 1.2.  Total bilirubin 1.3.  LDH 945.  A  review of the peripheral blood smear showed schistocytes.  The PT was 14.1  and PTT 38.   PAST MEDICAL HISTORY:  1.  TTP as outlined above.  2.  History of kidney stones requiring ultrasound treatment in the past.  3.  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   CURRENT MEDICATIONS:  1.   Gemfibrozil 600 mg daily.  2.  Lipitor 80 mg daily.  3.  Mobic 400 mg daily.  4.  Centrum Silver daily.  5.  B12.  6.  Prozac 20 mg daily.  7.  Calcium plus D.  8.  Vitamin E.  9.  Vitamin C.  10. Folic acid 1 mg daily.  11. Tylenol Arthritis.  12. Glucosamine.  13. Tramadol 50 mg p.r.n.   ALLERGIES:  DICLOFENAC, SULFA, ADHESIVE TAPE.   FAMILY HISTORY:  Mother died of old age in her mid-80's.  Father died at age  46 of cancer, type unknown to the patient.  A sister deceased with acute  leukemia.   SOCIAL HISTORY:  [name] is married.  She lives at [facility].  She has one  daughter.  She is retired from [employer].   REVIEW OF SYSTEMS:  [name] reports increased bruising over the past two  weeks.  She denies any bleeding.  She has had back pain for the past six to  seven months.  She had an MRI of her back  several weeks ago, with the  findings of a pinched nerve.  She reports surgery has been planned.  She  recently underwent a chemical stress test, in anticipation of the upcoming  surgery.  She had been fatigued.  She has had headaches for the past week.  She denies any double vision.  No dysphagia.  No shortness of breath or  cough.  She denies any chest pain.  She has had intermittent nausea.  Stools  have been yellow in color.  She denies any rectal bleeding.  Her urine has  been dark with a foul odor.  She denies any arthralgias/myalgias.  One week  ago she had an episode of fainting.  She did not lose consciousness.  Her  husband noted slurred speech and a left facial droop.  They contacted EMS.  Her symptoms resolved quickly and have not recurred.   PHYSICAL EXAMINATION:  VITAL SIGNS:  Temperature 99.3 degrees, heart rate  92, respirations 20, blood pressure 134/79.  Weight 172.8 pounds.  Height 65  inches.  GENERAL:  Pleasant Caucasian female, in no acute distress.  HEENT:  Normocephalic and atraumatic.  Pupils equal, round, reactive to  light.  Extraocular movements  intact.  Fundi normal.  Sclerae anicteric.  Oropharynx without thrush or ulcerations.  NODES:  No palpable cervical, supraclavicular or axillary lymph nodes.  CHEST:  Lungs clear bilaterally.  CARDIOVASCULAR:  A regular rate and rhythm.  No murmur.  ABDOMEN:  Soft, no organomegaly.  EXTREMITIES:  No edema.  Petechiae scattered over the lower legs.  VASCULAR:  Femoral pulses 1+ bilaterally.  Radial pulses 1+ bilaterally.  Dorsalis pedis 1+ on the left, not palpable on the right.  Posterior tibial  normal on the right, unable to palpate on the left.  NEUROLOGIC:  Alert and oriented.  Cranial nerves intact.  Finger-to-nose  intact.  Motor strength is 5/5.  Deep tendon reflexes 2+ and symmetric.  Able to spell the word house backward.   LABORATORY DATA:  Hemoglobin 10.2, white count 6.7, platelet count 24,000,  retic 4.17%.  Sodium 139, potassium 3.9, chloride 106, CO2 of 24, glucose  150, BUN 35, creatinine 1.2.  Total bilirubin 1.3, alkaline phosphatase 47,  SGOT 34, SGPT 19, total protein 6.7, albumin 3.5, calcium 10.  LDH 945.  PT  14.1, INR 1.1, PTT 38.   Peripheral blood smear positive for schistocytes.   IMPRESSION:  Relapsed thrombotic thrombocytopenia purpura.   PLAN:  1.  Urgent placement of vascular catheter.  2.  Begin steroids/plasma exchange (78 kg, use 80; 35 mL per kg equals 2800      mL daily plasma exchange).  3.  Monitor hematologic and neurologic status.   The patient is interviewed and examined by Dr. Genene Churn. Granfortuna.  The  plan is reviewed.      Lonna Cobb, N.P.      Genene Churn. Cyndie Chime, M.D.  Electronically Signed    LT/MEDQ  D:  12/08/2004  T:  12/08/2004  Job:  [id number]   cc:   Dr. Harless Nakayama, [place]   P. Liliane Bade, M.D.  [address]  [place]  [place] [address]

## 2010-09-17 NOTE — Op Note (Signed)
[name], [name]             ACCOUNT NO.:  [id number]   MEDICAL RECORD NO.:  [id number]          PATIENT TYPE:  INP   LOCATION:  5509                         FACILITY:  [facility]   PHYSICIAN:  Larina Earthly, M.D.    DATE OF BIRTH:  [birth year]   DATE OF PROCEDURE:  12/17/2004  DATE OF DISCHARGE:                                 OPERATIVE REPORT   PREOPERATIVE DIAGNOSIS:  Thrombotic thrombocytopenic purpura.   POSTOPERATIVE DIAGNOSIS:  Thrombotic thrombocytopenic purpura.   PROCEDURE:  Right internal jugular Diatek catheter placement.   SURGEON:  Larina Earthly, M.D.   ASSISTANT:  Nurse.   ANESTHESIA:  MAC.   COMPLICATIONS:  None.   DISPOSITION:  To the recovery room stable.   PROCEDURE IN DETAIL:  The patient was taken to the operating room and placed  in the supine position, where the right and left neck was prepped and draped  in the usual sterile fashion.  The patient was placed in Trendelenburg  position.  The patient had an existing temporary Flexicon catheter.  The  catheter was prepped and draped as well.  With the patient in Trendelenburg  position, the catheter was transected and a guidewire was positioned down  the current catheter.  The catheter was then removed and the guidewire was  positioned at the level of the right atrium.  The dilator and peel-away  sheath was passed over the guidewire.  The dilator and guidewire were  removed and a 32 cm catheter was passed down the peel-away sheath, which was  then removed as well.  The separate incision was made below the level of the  clavicle and a tunnel was created from this site to the catheter entry site.  The catheter was brought through the subcutaneous tunnel.  The two lumen  ports were attached and both lumens flushed and aspirated easily and were  locked 1000 unit/mL heparin.  The catheter tip was positioned at the distal  superior vena cava-right atrial junction.  The catheter was secured to the  skin with 3-0  nylon stitch and the entry site was closed with 4-0  subcuticular Vicryl stitch.  A sterile dressing was applied and the patient  was taken to the recovery room in stable condition, where a chest x-ray is  obtained.      Larina Earthly, M.D.  Electronically Signed     TFE/MEDQ  D:  12/17/2004  T:  12/17/2004  Job:  [id number]

## 2010-09-17 NOTE — H&P (Signed)
[name], [name]             ACCOUNT NO.:  [id number]   MEDICAL RECORD NO.:  [id number]          PATIENT TYPE:  INP   LOCATION:  1330                         FACILITY:  [facility]   PHYSICIAN:  Genene Churn. Granfortuna, M.D.DATE OF BIRTH:  29-Apr-1941   DATE OF ADMISSION:  02/23/2005  DATE OF DISCHARGE:                                HISTORY & PHYSICAL   CHIEF COMPLAINT:  Fever and chills.   HISTORY OF PRESENT ILLNESS:  [name] is a 70 year old woman with relapsed  TTP. Initial diagnosis dates to May 2005 when she presented with neurologic  deficits. She was admitted to [facility] and was found to have TTP. She was  treated with plasma exchange, steroids and antiplatelet agents. Her  condition stabilized rapidly and care was subsequently transferred to  [facility]. She did well and only required plasma exchange for approximately  3 weeks to achieve a durable remission. On December 08, 2004, she presented  with transient neurologic symptoms with dysarthria and a presyncopal  episode. Lab work showed severe thrombocytopenia and microangiopathic  hemolytic anemia. Treatment with plasma exchange and steroids was initiated  with a prompt and complete response. However, the platelet count began to  drift down requiring an increase in the volume of plasma exchange to 1.5  plasma volumes daily. She completed a course of 4 weekly Rituxan treatments  January 06, 2005  through January 27, 2005. The platelet count stabilized  and was greater than 200,000. Plasma exchange was weaned to every other day  and then stopped completely around January 27, 2005. The platelet count  drifted down off of paresis with plasma exchange resumed on February 15, 2005. She also began Imuran 100 mg daily at that time.   She was admitted to [facility] on February 21, 2005 with muscle  spasms and persistent nausea following plasma exchange earlier that day. CBC  showed hemoglobin 8.6, white count 2.5, ANC  1.4 and platelet count 229,000.  CMET was essentially unremarkable. Her temperature was 99.3 on admission but  she remained afebrile overnight. She was discharged home on February 22, 2005  with complete resolution of the symptoms.   [name] presented to dialysis this morning for plasma exchange. About 30  minutes into the exchange, she developed nausea not responsive to a dose of  Zofran. She also developed chills. Temperature increased to 100.4 degrees.  She presented to the [facility] for further evaluation, admission.   [name] reports that she had a low grade fever while at plasma exchange  this morning as well as shaking chills. She denies any shortness of breath  or cough. She has had nausea. No emesis. No abdominal pain. No diarrhea. She  has had no bleeding. She denies any hematuria or dysuria.   PAST MEDICAL HISTORY:  1.  TTP.  2.  Hypertension.  3.  History of a kidney stones.  4.  Degenerative arthritis  5.  Tobacco addiction.  6.  Depression.  7.  Hyperlipidemia.   MEDICATIONS:  1.  Prednisone 20 mg daily alternating with 10 mg.  2.  Prozac 20 mg daily.  3.  Folic acid 1 mg daily.  4.  K-Dur 20 mEq daily.  5.  Xanax 0.25 mg 1-2 tablets every 6 hours as needed.   ALLERGIES:  1.  SULFA.  2.  DICLOFENAC.  3.  ADHESIVE TAPE.   FAMILY HISTORY:  Noncontributory.   SOCIAL HISTORY:  [name] lives with her husband in [place]. She has one  daughter. She is retired from [employer].   REVIEW OF SYSTEMS:  [name] reports a low grade fever earlier today with  associated shaking chills. She denies any shortness of breath or cough. No  hematuria or dysuria. No abdominal pain. No diarrhea. She has had nausea.  She denies any emesis.   PHYSICAL EXAM:  VITAL SIGNS:  Temperature to 99.8, heart rate 122,  respirations 20, blood pressure 155/80.  GENERAL:  Ill-appearing Caucasian female having intermittent shaking chills.  HEENT:  Normocephalic, atraumatic. Pupils equal  round and reactive to light;  extraocular movement is intact; sclerae anicteric. Oropharynx is without  thrush or ulceration.  CHEST/LUNGS:  Clear bilaterally. Catheter site is nontender.  CARDIOVASCULAR:  Regular, tachycardiac.  ABDOMEN:  Soft and nontender. No organomegaly.  EXTREMITIES:  No edema.  NEURO:  Alert and oriented. Moves all extremities. Motor strength is 5/5.  DTRs 2+, symmetric. Finger-to-nose intact. Normal concentration.   LABORATORY DATA:  Hemoglobin 9.2, white count 2.6, absolute neutrophil count  0.5, platelet count 241,000, retic 3.2%, sodium 142, potassium 3.4, BUN 10,  creatinine 0.9, glucose 96, total bilirubin 0.4, alkaline phosphatase 74,  SGOT 36, SGPT 17, total protein 5.5, albumin 3.2, calcium 9, LDH 352.   IMPRESSION:  1.  Sepsis, likely secondary to vascular catheter.  2.  Leukopenia secondary to sepsis.  3.  Relapsed TTP/immunocompromised patient.   PLAN:  1.  Culture.  2.  Antibiotics.  3.  Neupogen.  4.  Removed vascular catheter.  5.  Stress dose steroids.   Unfortunately, the sepsis will likely cause deterioration in TTP.   The patient interviewed and examined by Dr. Cyndie Chime; plan reviewed.      Lonna Cobb, N.P.      Genene Churn. Cyndie Chime, M.D.  Electronically Signed    LT/MEDQ  D:  02/24/2005  T:  02/24/2005  Job:  [id number]

## 2010-09-17 NOTE — Discharge Summary (Signed)
NAMEDORRINE, MONTONE             ACCOUNT NO.:  [id number]   MEDICAL RECORD NO.:  [id number]          PATIENT TYPE:  INP   LOCATION:  1330                         FACILITY:  [facility]   PHYSICIAN:  Genene Churn. Granfortuna, M.D.DATE OF BIRTH:  March 28, 1941   DATE OF ADMISSION:  02/23/2005  DATE OF DISCHARGE:  02/28/2005                                 DISCHARGE SUMMARY   REASON FOR ADMISSION:  Fever and chills.   HISTORY OF PRESENT ILLNESS:  Ms. Reddoch is a 70 year old woman with relapsed  TTP.  She initially presented in May 2005 with neurologic deficits.  She was  admitted to [facility] and was found to have TTP.  She was treated with  plasma exchange, steroids and antiplatelet agents.  Her condition stabilized  rapidly and care was subsequently transferred to Dr. Cyndie Chime in  [place].  She did well requiring plasma exchange for approximately three  weeks and achieving a durable remission.  On December 08, 2004, she presented  with transient neurologic symptoms with dysarthria and a presyncopal  episode.  Lab work showed severe thrombocytopenia and a microangiopathic  hemolytic anemia.  Treatment with plasma exchange and steroids was initiated  with a prompt and complete response. However, the platelet count began to  drift down requiring an increase in the volume of plasma exchange to 1.5  plasma volumes daily.  She completed a course of four weekly Rituxan  treatments January 06, 2005 through January 27, 2005.  The platelet count  stabilized in normal range.  Plasma exchange was weaned to every other day  and then stopped completely around January 27, 2005.  The platelet count  drifted down off of pheresis with plasma exchange resumed on February 15, 2005.  She also began Imuran 100 mg daily at that time.   On February 21, 2005,  she was admitted to [facility] with muscle  spasms and persistent nausea. She had plasma exchange earlier that day. CBC  showed hemoglobin 8.6,  white count 2.5, absolute neutrophil count 1.4 and  platelet count of 229,000.  C-MET was essentially unremarkable.  Temperature  was 99.3 on admission. She remained afebrile overnight.  She was discharged  home on February 02, 2005 with complete resolution of the symptoms.   Ms. Caravello presented to dialysis for plasma exchange as scheduled on February 23, 2005.  About 30 minutes into the exchange, she developed nausea.  She  received Zofran without improvement.  She also developed chills. and her  temperature increased to 100.4 degrees. She was subsequently admitted for  further evaluation.   PHYSICAL EXAMINATION:  VITAL SIGNS:  Admission vital signs showed  temperature 99.8, heart rate 122, respirations 20, blood pressure 155/80.  GENERAL:  Admission physical examination showed an ill-appearing Caucasian  female having intermittent shaking chills. The vascular catheter site was  unremarkable.  LUNGS:  Clear bilaterally.  HEART:  Regular, tachycardiac.  ABDOMEN:  Soft and nontender.  No organomegaly.  EXTREMITIES:  Without edema.  NEUROLOGICAL:  She was alert and oriented.  Motor strength of 5/5.  DTRs 2+  and symmetric.  Concentration was normal.  Finger-to-nose intact.   HOSPITAL COURSE:  Ms. Hitz was admitted to [facility] on October,  25, 2006, for further evaluation.  Admission lab work showed hemoglobin 9.2,  white count 2.6, absolute neutrophil count 0.5, platelet count 241,000.  Reticulocyte count 3.2%.  Sodium 144m  potassium 3.4, BUN 10, creatinine  0.9, glucose 96, total bilirubin 0.4, alkaline phosphatase 74, SGOT 36, SGPT  17, total protein 5.5, albumin 3.2, calcium 9.0 and LDH 352.   A peripheral IV was established.  Intravenous hydration was initiated with  D5 1/2 normal saline with 20 mEq of potassium per liter at 125 cc per hour.  Dr. Cyndie Chime was concerned was concerned for sepsis, likely secondary to  the vascular catheter.  Two sets of blood cultures  were obtained earlier in  the day at dialysis, one from the vascular catheter and one peripherally.  Upon admission, intravenous antibiotics were initiated with Primaxin and  vancomycin.  CVTS was consulted for removal of the vascular catheter.  The  catheter was removed on February 24, 2005, without difficulty.  Ms. Recinos  remained afebrile throughout the hospitalization. The blood cultures as well  as a urine culture remained negative.  On February 28, 2005, a PICC line was  placed for continuation of IV antibiotics as an outpatient.  The vancomycin  will be continued for an additional eight days.  Upon admission, Ms. Honold  was neutropenic with an absolute neutrophil count of 0.5.  The neutropenia  was felt to be secondary to a combination of the sepsis as well as Imuran.  The Imuran was discontinued earlier in the week.  She was started on  Neupogen 300 mcg subcu daily on the night of admission.  The white count  normalized. CBC on February 28, 2005, showed a total white count of 15.9 with  an absolute neutrophil count of 11.8.  The Neupogen was subsequently  discontinued.   Ms. Macfarlane was having significant nausea on admission.  She received IV  fluids and intravenous antiemetics.  The nausea had resolved by discharge.   The CBC was followed closely throughout the hospitalization.  Her platelet  count remained stable, at greater than 200,000, off of plasma exchange.  The  hemoglobin also remained fairly stable. The hemoglobin was 9.2 on admission  and 9.6 at discharge. A haptoglobin on February 25, 2005, was normal at 176.  LDH that same day was 261.  A repeat LDH on February 28, 2005 returned  elevated at 725.  The LDH will be repeated as an outpatient.  Ms. Dezeeuw  received intravenous steroids with Solu-Medrol during the hospitalization.  Prednisone will be resumed at discharge at a dose of 40 mg daily alternating with 20 mg daily.  I will continue to follow the CBC closely as an   outpatient.   On February 18, 2005, Ms. Koch was felt to be stable for discharge home.  A  PICC line was placed earlier in the day for continuation of IV antibiotics.  Arrangements were made for her to receive an additional eight days of IV  vancomycin at an outpatient cancer center.   Laboratory data February 28, 2005, hemoglobin 9.6, white count 15.9, platelet  count 261,000, absolute neutrophil count 11.8, reticulocyte 4.1%.  Sodium  137, potassium 4.5, chloride 93, CO2 28, glucose 79, BUN 9, creatinine 0.8,  total bilirubin 0.7, alkaline phosphatase 74, SGOT 30, SGPT 15, total  protein 5.3, albumin 2.9, calcium 8.4, LDH 725.   CONSULTATIONS:  CVTS.  PROCEDURES:  1.  IV antibiotics.  2.  Removal of a vascular catheter February 24, 2005.   DISCHARGE DIAGNOSES:  1.  Presumed catheter sepsis.  2.  Neutropenia secondary to Imuran and sepsis, resolved.  3.  Relapsed thrombotic thrombocytopenic purpura.  4.  Nausea, resolved.  5.  Chronic obstructive pulmonary disease.  6.  Hypertension.  7.  Degenerative arthritis.  8.  Tobacco addiction.  9.  Depression.  10. Hyperlipidemia   DISCHARGE DISPOSITION:  1.  Condition:  Stable.  2.  Diet:  No restrictions.  3.  Activity:  Walk with assistance.  4.  Wound care:  Routine care of PICC line at the cancer center.   FOLLOWUPGenene Churn. Cyndie Chime, M.D. as scheduled.   SPECIAL INSTRUCTIONS:  Call for fever greater than or equal to 101 degrees,  bleeding or any other problems.   DISCHARGE MEDICATIONS:  1.  Vancomycin IV daily at the cancer center.  2.  Prozac 20 mg daily.  3.  Folic acid 1 mg daily.  4.  K-Dur 20 mEq daily.  5.  Prednisone 40 mg daily alternating with 20 mg daily.  6.  Protonix 40 mg daily.  7.  Mycelex thrush 10 mg dissolved  mouth four times daily.  8.  Xanax 0.25 mg one to two tablets every 6 hours as needed for anxiety.      Lonna Cobb, N.P.      Genene Churn. Cyndie Chime, M.D.  Electronically  Signed    LT/MEDQ  D:  02/28/2005  T:  03/01/2005  Job:  [id number]

## 2010-09-17 NOTE — Discharge Summary (Signed)
[name], [name]             ACCOUNT NO.:  [id number]   MEDICAL RECORD NO.:  [id number]          PATIENT TYPE:  INP   LOCATION:  5509                         FACILITY:  [facility]   PHYSICIAN:  Genene Churn. Granfortuna, M.D.DATE OF BIRTH:  [birth year]   DATE OF ADMISSION:  12/08/2004  DATE OF DISCHARGE:  12/18/2004                                 DISCHARGE SUMMARY   [name] is a 70 year old woman diagnosed with TTP in May 2005. She  presented with a transient neurologic deficit and was found to have a  microangiopathic hemolytic anemia with associated thrombocytopenia. She was  initially treated and stabilized at [facility] with  combination of plasma exchange, steroids, and antiplatelet agents. She was  transferred here and continued plasma exchange. She achieved a complete  remission. She has done well until today when her primary care physician  faxed [place] a CBC which showed a platelet count of 15,000 and a hemoglobin of  11.2. Most recent labs done in my office on Aug 31, 2004 showed a hemoglobin  of 13 and platelet count 203,000, LDH 188 and bilirubin 0.7. Lab at her  primary care's office in on October 27, 2004 was also normal.   The patient admits to a reappearance of spontaneous bruising over the last  two weeks, intermittent headaches over the last week, and a single  presyncopal episode with associated transient dysarthria and left facial  droop occurring one week ago. She did not seek any medical attention for  these complaints.   We repeated a CBC in our office today and confirmed anemia and  thrombocytopenia with a hemoglobin of 10, platelet count 24,000,  reticulocytes 4.2%, bilirubin of 1.3 and significant elevation of LDH at  945. On review of the peripheral blood smear, there are schistocytes  present. No evidence for a coagulopathy with a normal PT of 14.1 and PTT of  38 seconds.   She was admitted at this time on an urgent basis for further  treatment.   Initial exam showed a pleasant woman in no acute distress. She was alert and  oriented. Pulse 92 regular, blood pressure 134/79, respirations 20,  temperature 99.3 orally. She was 5 feet 5 inches tall, 173 pounds, 78 kg.  Pertinent physical findings include pupils equal, reactive to light. Optic  disk sharp. Fundi normal. No hemorrhage or exudate. No palpable  lymphadenopathy. No cardiac murmurs. No mass or organomegaly in the abdomen.  The vascular exam is normal with femoral pulses 1+ symmetric, radial pulses  1+ symmetric, dorsalis pedis pulse 1+ on the left not palpable on the right,  and posterior tibial pulse palpable on the right but not on the left. The  neurologic showed mental status intact and cranial nerves intact. Motor  strength 5/5 all extremities. Finger-to-nose, finger-to-finger, rapid  alternating movements normal. Reflexes 2+ symmetric. Concentration normal.  She was able to spell the word house backwards without any difficulty.   HOSPITAL COURSE:  Cardiovascular surgery consultation was obtained. A  temporary vascular catheter was placed by a right internal jugular approach.  She was started on parenteral corticosteroids with Solu-Medrol  80 milligrams  IV daily. Plasma exchange was initiated on the evening of admission using an  estimated plasma volume of 35 cc/kg, weight of 80 kg, total exchange 2800 mL  of fresh frozen plasma. No antiplatelet agents were used. I do not feel the  literature supports any benefit from these in this situation.   Fortunately, she had a very prompt and dramatic response. After the first  exchange, platelet count was 42,000, LDH down to 391. Following the second  exchange, platelets 96,000. After the third exchange, she had reached a  complete response with a platelet count of 196,000. I elected to give her  seven daily exchanges and then go to an every other day schedule. By the  sixth exchange, the temporary vascular  catheter was unable to provide the  flow rates necessary and vascular surgery was reconsulted and a permanent  subclavian vascular catheter access was placed without complication.   Hemoglobin fell as low as 7.4 on the second hospital day and she received a  single 2 units packed cells blood transfusion.   After the first exchange, her bilirubin had normalized down to 0.6, LDH down  to 391 and subsequent LDH 244 and the LDH remained normal. The LDH and the  bilirubin remained normal for the duration of the hospital course. At time  of discharge on December 18, 2004, the hemoglobin was 11.1, hematocrit 32,  white count 11,000, platelets 381,000, bilirubin of 0.5, LDH of 231.   Additional data obtained during this admission; a chest radiograph showed  borderline cardiomegaly with stable changes of chronic bronchitis and mild  chronic interstitial lung disease. An electrocardiogram showed normal sinus  rhythm with some nonspecific T-wave abnormalities.   Initial BUN 27 with creatinine 1.2. At time of discharge, BUN 25, creatinine  1.2. Random glucoses as high as 198 on steroids, value at discharge 160.  Urinalysis done on December 09, 2004 showed negative bilirubin, trace positive  hemoglobin, trace positive protein, three to six red cells. Blood type was  O+, antibody screen negative. Pro time 14.1, PTT 22 seconds. Hepatitis B  surface-antigen negative.   CONSULTATIONS:  Cardiovascular surgery.   PROCEDURES:  1.  Plasma exchange.  2.  Placement of temporary vascular catheter.  3.  Removal of temporary catheter and placement of permanent right      subclavian vascular catheter.  4.  Blood transfusion times one.   DIAGNOSES:  1.  Relapsed thrombotic thrombocytopenic purpura.  2.  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  3.  Tobacco addiction.  4.  History of renal stones.  5.  Degenerative arthritis.  6.  Depression.  DISPOSITION:  Condition stable at time of discharge.  She will continue every  other day plasma exchange for one more week as an outpatient and then  reevaluate. I will slowly taper her steroids. She will follow up with me in  one  week. Medications to include prednisone 60 milligrams daily except on days  of plasma exchange when she will receive 80 milligrams IV, folic acid 1  milligram q.d., Protonix 40 milligrams q.d., Prozac 20 milligrams q.d., Tums  2 tablets t.i.d., Tylox one to two q.4h. p.r.n. back pain.      Genene Churn. Cyndie Chime, M.D.  Electronically Signed     JMG/MEDQ  D:  12/25/2004  T:  12/25/2004  Job:  [id number]   cc:   Larina Earthly, M.D.  [address]  [place]  [place] [address]   Nedra Hai, M.D.  Laurie Panda, M.D.  Hematology/Oncology [facility]

## 2010-09-17 NOTE — H&P (Signed)
[name], [name]             ACCOUNT NO.:  [id number]   MEDICAL RECORD NO.:  [id number]          PATIENT TYPE:  INP   LOCATION:  5712                         FACILITY:  [facility]   PHYSICIAN:  Genene Churn. Granfortuna, M.D.DATE OF BIRTH:  1941/02/26   DATE OF ADMISSION:  03/02/2005  DATE OF DISCHARGE:                                HISTORY & PHYSICAL   REASON FOR ADMISSION:  Relapsed TTP.   HISTORY OF PRESENT ILLNESS:  [name] is a 70 year old woman with relapse  TTP.  She initially presented in May 2005, with neurologic deficit.  She was  admitted to [facility] and was found to have a TTP.  She was treated with  plasma exchange, steroids, and anti-platelet agents.  Her condition  stabilized quickly and her care was subsequently transferred to Dr.  Cyndie Chime in [place].  She required plasma exchange for approximately  three weeks and achieved and durable remission.  On December 08, 2004, she  presented with transient neurologic symptoms with dysarthria and a  presyncopal episode.  Laboratory work at that time showed severe  thrombocytopenia and a microangiopathic hemolytic anemia.  She began  treatment with plasma exchange and steroids with a prompt and complete  response, however, the platelet count began to drift downward requiring an  increase in the volume of plasma exchange to 1.5 plasma volume daily.  The  platelet count stabilized in normal range.  She completed a course of four  weekly Rituxan treatments, January 06, 2005, through January 27, 2005.  Plasma exchange was weaned to every other day and then stopped completely  around January 27, 2005.  The platelet count drifted down off of phoresis  with plasma exchange resumed on February 15, 2005.  She also began Imuran 100  mg daily at that time.   In late October, she developed persistent nausea.  This was followed by  onset of fever and chills.  She was admitted to [facility] on  February 23, 2005, with  presumed catheter sepsis.  The vascular catheter was  removed on February 24, 2005.  She received six days of IV vancomycin while  hospitalized.  Her symptoms resolved.  A PICC catheter was placed for  continuation of IV vancomycin as an outpatient.  She was discharged home on  February 28, 2005, with eight additional days of vancomycin planned, for a  total of 14 days of IV antibiotics.  She also developed neutropenia in late  October, likely secondary to a combination of the sepsis and Imuran.  She  received Neupogen during the above hospitalization with normalization of the  white count.   [name] has been receiving IV vancomycin daily as an outpatient since her  discharge.  Laboratory work today showed a platelet count of 115,000  (166,000 on March 01, 2005), hemoglobin 9.7, and white count 20.6.  The  platelet count has been drifting downward since plasma exchange was stopped  on February 23, 2005.  She is now being admitted for placement of a vascular  catheter and reinitiation of plasma exchange.   PAST MEDICAL HISTORY:  1.  TTP, as outlined  above.  2.  Hypertension.  3.  History of kidney stones.  4.  Degenerative arthritis.  5.  Tobacco addiction.  6.  Depression.  7.  Hyperlipidemia.   CURRENT MEDICATIONS:  1.  Vancomycin 1250 mg IV daily.  2.  Prozac 20 mg daily.  3.  Folic acid 1 mg daily.  4.  K-Dur 20 mEq daily.  5.  Prednisone 40 mg alternating with 20 mg.  6.  Protonix 40 mg daily.  7.  Mycelex troche 10 mg q.i.d.  8.  Xanax 0.25 mg one to two tablets q.6h. p.r.n. for anxiety.   ALLERGIES:  1.  SULFA.  2.  DICLOFENAC.  3.  ADHESIVE TAPE.   FAMILY HISTORY:  Noncontributory.   SOCIAL HISTORY:  [name] lives with her husband in [place].  She has  one daughter.  She is retired from [employer].  She is a heavy smoker.   REVIEW OF SYSTEMS:  [name] denies any fever or chills.  No headaches.  No  diplopia.  No nausea.  Appetite is improving.  No pain.  No  shortness of  breath.  No cough.  She denies any chest pain.  No leg swelling.  No  bleeding.  No recent change in bowel habits.  No hematuria or dysuria.   PHYSICAL EXAMINATION:  VITAL SIGNS:  Temperature 99.1, heart rate 106,  respirations 16, blood pressure 117/68.  GENERAL:  A cheerful Caucasian female in no acute distress.  HEENT:  Normocephalic, atraumatic.  Pupils equal, round, reactive to light.  Extraocular movements were intact.  Sclerae anicteric.  Fundi normal.  Oropharynx is without thrush or ulceration.  CHEST:  Lung clear bilaterally;  no wheezes or rales.  CARDIOVASCULAR:  Regular rate and rhythm.  ABDOMEN:  Soft and nontender.  No organomegaly.  EXTREMITIES:  No edema.  PICC site is nontender without erythema.  NEUROLOGIC:  Alert and oriented.  Motor strength 5/5.  DTR's 2+ and  symmetric.   LABORATORY DATA:  Hemoglobin 9.7, white count 20, platelet count 115,000.   IMPRESSION:  1.  Relapsed TTP.  2.  Recent catheter sepsis, status post removal of vascular catheter on      February 23, 2005, with eight of 14 days of intravenous vancomycin      completed.  3.  Recent neutropenia secondary to sepsis and Imuran, resolved.  4.  Nausea, resolved.  5.  Chronic obstructive pulmonary disease.  6.  Hypertension.  7.  Tobacco addiction.  8.  Depression.   PLAN:  [name] has relapsed TTP.  Platelet count is falling off of plasma  exchange for seven days.  The vascular catheter had to be removed last week  secondary to infection.  She will need a new catheter as soon as possible  and resume plasma exchange.   The patient interviewed and examined by Dr. Cyndie Chime;  plan reviewed.      Lonna Cobb, N.P.      Genene Churn. Cyndie Chime, M.D.  Electronically Signed    LT/MEDQ  D:  03/02/2005  T:  03/02/2005  Job:  [id number]

## 2010-09-17 NOTE — H&P (Signed)
[name], [name]             ACCOUNT NO.:  [id number]   MEDICAL RECORD NO.:  [id number]          PATIENT TYPE:  INP   LOCATION:  1303                         FACILITY:  [facility]   PHYSICIAN:  Genene Churn. Granfortuna, M.D.DATE OF BIRTH:  [birth year]   DATE OF ADMISSION:  02/21/2005  DATE OF DISCHARGE:                                HISTORY & PHYSICAL   CHIEF COMPLAINT:  Persistent nausea and sudden onset of involuntary muscle  jerks and a lady with relapsed  TTP.   HISTORY OF PRESENT ILLNESS:  This is a 70 year old woman who initially  presented in [id number] with transient neurologic deficits as first sign of  thrombocytopenic purpura (TPP).  She was admitted to [facility] where she was found to have microangiopathic hemolytic anemia and  thrombocytopenia.  She was started on a started on a program of plasma  exchange, steroids, and antiplatelet agents.  Condition stabilized rapidly,  and she was transferred to [facility] for further care.  She did well and  only required plasma exchange for about three weeks to achieve a durable  remission.  She relapsed on December 08, 2004, again with transient neurologic  symptoms with dysarthria and a presyncopal episode.  Blood counts again  showed severe thrombocytopenia and microangiopathic hemolytic anemia.  Please see my history and physical exam of December 08, 2004, for full details.   She was again put back on a program of plasma exchange and steroids.  She  had a very prompt and complete response within the first three plasma  exchanges.  However, subsequent to that, platelet count began to drift down  again.  She required daily plasma exchange for two weeks.  She then required  an increase in the volume of the plasma exchange to 1.5 plasma volumes  daily. On attempts to taper her to an every other day schedule, platelet  count, cells and daily exchange had to be resumed.  I elected to start her  on a program of Rituxan anti-B  cell antibody, and she received four weekly  doses beginning September 7-28, 2006.  Platelet count stabilized and was  over 200,000.  She was able to be weaned off the plasma exchange going first  to an every other day schedule and then stopping completely approximately  January 27, 2005.  Unfortunately, off plasma exchange platelet count began  to drift down from peak value of 277,000 on February 04, 2005, to 117,000 by  February 14, 2005.  Plasma exchange was resumed last Tuesday, February 15, 2005.  I elected to start her on an additional immunosuppressive agent at  that time, Imuran 100 mg daily.   Over the last three weeks, she has had progressive nausea, worse on the days  when she got her plasma exchange and the Rituxan.  This has been only  partially responsive to outpatient antiemetics.  Nausea is now occurring on  a daily basis and there may be some effect from the Imuran.  Today, she had  a plasma exchange.  Nurses called to report involuntary muscle spasms of her  abdominal muscles.  By the  time I received a message the patient was already  discharged to go home.  Daughter called me late this afternoon to report  that her mom was now having generalized muscle spasms of her extremities.  This did not appear to be tonic clonic activity. and there was no loss of  consciousness.  I advised the family to bring her in for further evaluation.   PAST MEDICAL HISTORY:  The past history includes hypertension, tobacco  addiction, depression, degenerative arthritis, kidney stones and  hyperlipidemia.  No history of MI, diabetes, stomach ulcers, emphysema or  asthma although she is a heavy smoker.  No hepatitis, yellow jaundice,  thyroid trouble. previous stroke, seizure or blood clots.   CURRENT MEDICATIONS:  1.  Prednisone in tapering doses, currently down to 20 mg daily alternating      with 10 mg.  2.  Prozac 20 mg every day.  3.  Folic acid 1 mg every day.  4.  K-Dur 20 mEq p.o.  every day  5.  Xanax 0.25 mg one to two q.6 h p.r.n. anxiety or sleep.   ALLERGIES:  She is our allergic to SULFA DRUGS, DICLOFENAC and ADHESIVE  TAPE.   FAMILY HISTORY:  The family history is noncontributory.   SOCIAL HISTORY:  Married, one daughter.  Cigarette smoker.   REVIEW OF SYSTEMS:  She denied any fevers, headache, change in vision,  dysarthria, focal weakness, dyspnea, chest pain, sore throat, cough,  abdominal pain other than the muscle spasms.  No change in bowel habits.  No  dysuria or frequency.  No bone pain other than chronic back pain from  arthritis.   PHYSICAL EXAMINATION:  GENERAL:  Exam shows a chronically ill-appearing  woman who is mildly cushingoid from chronic steroids.  VITAL SIGNS:  Blood pressure 154/87, pulse 116 regular, respirations 20,  temperature 99.2 orally.  INTEGUMENT:  The skin, hair and nails are normal with no ecchymosis,  petechiae or rash.  HEENT:  Pupils equal, reactive to light. Bilateral cataracts.  I could not  get a good look at the optic disk and there was no gross evidence for  retinal hemorrhage.  Pharynx no erythema or exudate.  NECK:  Supple, no thyromegaly. Carotids 2+ no bruits.  LUNGS:  Clear; resonant to percussion.  HEART:  Regular cardiac rhythm, rapid rate. No murmur. No gallop, no rales.  There is a vascular catheter in the right subclavian position.  No gross  exudate or erythema.  ABDOMEN:  The abdomen is soft, currently nontender.  No masses or  organomegaly. EXTREMITIES:  No edema. No calf tenderness.  NEUROLOGIC:  Mental status intact. Cranial nerves grossly normal.  Motor  strength 5/5.  Reflexes 2+ and symmetric.   LABORATORY DATA:  Hemoglobin is 8.6, hematocrit 26, white count 2500,  neutrophils 56%, platelet count 299,000.  LDH is 336, reticulocyte count  3.6%, bilirubin 0.7.  Calcium is 8.6 with albumin 3.6, potassium 3.6, BUN  14, creatinine 0.9.   IMPRESSION: 1.  Relapsed thrombocytopenic purpura.  She  does appear to be having a      response again to intermittent plasma exchange despite tapering her      steroids.  She has been on the Imuran now for one week.  Starting to see      myelosuppression with white count 2500 but  absolute neutrophils still      over 1000.  I am going to continue the every other day plasma exchange      for  least one more week and then try to taper her off again.  2.  Persistent and progressive nausea.  I think that this is likely      multifactorial.  She has received large volumes of plasma over the last      2 months.  Cytokines in the plasma have been known to cause GI symptoms.      In addition, she was just started on Imuran.  There does not to be any      gross evidence for bowel ischemia at this time.  I am going to treat      symptomatically with IV hydration and parenteral antiemetics.  3.  Involuntary muscle spasms.  This may be related to borderline decrease      in her calcium. Again this is related to large volumes of plasma      received over the last two months which contained calcium chelating      agents.  I am going to give her a dose of parenteral calcium  and then      monitor her electrolytes status.  4.  Hypertension. Continue current medications  5.  Tobacco addiction.  6.  Chronic depression.  7.  Degenerative arthritis status post previous spinal surgery.      Genene Churn. Cyndie Chime, M.D.  Electronically Signed     JMG/MEDQ  D:  02/21/2005  T:  02/21/2005  Job:  [id number]

## 2010-09-20 ENCOUNTER — Encounter (HOSPITAL_BASED_OUTPATIENT_CLINIC_OR_DEPARTMENT_OTHER): Payer: Medicare Other | Admitting: Oncology

## 2010-09-20 ENCOUNTER — Other Ambulatory Visit: Payer: Self-pay | Admitting: Oncology

## 2010-09-20 DIAGNOSIS — D6949 Other primary thrombocytopenia: Secondary | ICD-10-CM

## 2010-09-20 DIAGNOSIS — B370 Candidal stomatitis: Secondary | ICD-10-CM

## 2010-09-20 DIAGNOSIS — M311 Thrombotic microangiopathy: Secondary | ICD-10-CM

## 2010-09-20 DIAGNOSIS — M479 Spondylosis, unspecified: Secondary | ICD-10-CM

## 2010-09-20 LAB — CBC WITH DIFFERENTIAL/PLATELET
BASO%: 0.4 % (ref 0.0–2.0)
Eosinophils Absolute: 0.3 10*3/uL (ref 0.0–0.5)
HCT: 40.1 % (ref 34.8–46.6)
HGB: 13.8 g/dL (ref 11.6–15.9)
MCH: 29.2 pg (ref 25.1–34.0)
MCV: 84.9 fL (ref 79.5–101.0)
MONO#: 0.7 10*3/uL (ref 0.1–0.9)
Platelets: 140 10*3/uL — ABNORMAL LOW (ref 145–400)
RDW: 21.9 % — ABNORMAL HIGH (ref 11.2–14.5)
lymph#: 1.2 10*3/uL (ref 0.9–3.3)

## 2010-09-23 ENCOUNTER — Other Ambulatory Visit: Payer: Self-pay | Admitting: Oncology

## 2010-09-23 ENCOUNTER — Encounter (HOSPITAL_BASED_OUTPATIENT_CLINIC_OR_DEPARTMENT_OTHER): Payer: Medicare Other | Admitting: Oncology

## 2010-09-23 DIAGNOSIS — D6949 Other primary thrombocytopenia: Secondary | ICD-10-CM

## 2010-09-23 DIAGNOSIS — M479 Spondylosis, unspecified: Secondary | ICD-10-CM

## 2010-09-23 DIAGNOSIS — B370 Candidal stomatitis: Secondary | ICD-10-CM

## 2010-09-23 DIAGNOSIS — M311 Thrombotic microangiopathy: Secondary | ICD-10-CM

## 2010-09-23 LAB — COMPREHENSIVE METABOLIC PANEL
Albumin: 4.1 g/dL (ref 3.5–5.2)
CO2: 26 mEq/L (ref 19–32)
Glucose, Bld: 108 mg/dL — ABNORMAL HIGH (ref 70–99)
Sodium: 143 mEq/L (ref 135–145)
Total Bilirubin: 0.4 mg/dL (ref 0.3–1.2)
Total Protein: 6.5 g/dL (ref 6.0–8.3)

## 2010-09-23 LAB — CBC & DIFF AND RETIC
Basophils Absolute: 0 10*3/uL (ref 0.0–0.1)
EOS%: 0.1 % (ref 0.0–7.0)
Eosinophils Absolute: 0 10*3/uL (ref 0.0–0.5)
HGB: 13.9 g/dL (ref 11.6–15.9)
Immature Retic Fract: 4.9 % (ref 0.00–10.70)
MCH: 27.7 pg (ref 25.1–34.0)
MONO#: 0.7 10*3/uL (ref 0.1–0.9)
NEUT#: 5.4 10*3/uL (ref 1.5–6.5)
RDW: 19.1 % — ABNORMAL HIGH (ref 11.2–14.5)
Retic Ct Abs: 54.61 10*3/uL (ref 18.30–72.70)
WBC: 7.9 10*3/uL (ref 3.9–10.3)
lymph#: 1.7 10*3/uL (ref 0.9–3.3)

## 2010-09-23 LAB — LACTATE DEHYDROGENASE: LDH: 175 U/L (ref 94–250)

## 2010-09-23 LAB — MORPHOLOGY

## 2010-09-23 LAB — CHCC SMEAR

## 2010-11-09 ENCOUNTER — Other Ambulatory Visit: Payer: Self-pay | Admitting: Oncology

## 2010-11-09 ENCOUNTER — Encounter (HOSPITAL_BASED_OUTPATIENT_CLINIC_OR_DEPARTMENT_OTHER): Payer: Medicare Other | Admitting: Oncology

## 2010-11-09 DIAGNOSIS — D6949 Other primary thrombocytopenia: Secondary | ICD-10-CM

## 2010-11-09 DIAGNOSIS — M311 Thrombotic microangiopathy: Secondary | ICD-10-CM

## 2010-11-09 DIAGNOSIS — M479 Spondylosis, unspecified: Secondary | ICD-10-CM

## 2010-11-09 LAB — CBC & DIFF AND RETIC
BASO%: 0.9 % (ref 0.0–2.0)
EOS%: 4.9 % (ref 0.0–7.0)
HGB: 15.2 g/dL (ref 11.6–15.9)
Immature Retic Fract: 6.4 % (ref 0.00–10.70)
MCH: 29.5 pg (ref 25.1–34.0)
MCHC: 33.6 g/dL (ref 31.5–36.0)
RDW: 16.1 % — ABNORMAL HIGH (ref 11.2–14.5)
Retic Ct Abs: 66.44 10*3/uL (ref 18.30–72.70)
lymph#: 2 10*3/uL (ref 0.9–3.3)

## 2010-11-09 LAB — CHCC SMEAR

## 2010-11-09 LAB — COMPREHENSIVE METABOLIC PANEL
CO2: 27 mEq/L (ref 19–32)
Creatinine, Ser: 0.87 mg/dL (ref 0.50–1.10)
Glucose, Bld: 83 mg/dL (ref 70–99)
Total Bilirubin: 0.5 mg/dL (ref 0.3–1.2)
Total Protein: 6.4 g/dL (ref 6.0–8.3)

## 2010-11-09 LAB — LACTATE DEHYDROGENASE: LDH: 202 U/L (ref 94–250)

## 2010-11-18 ENCOUNTER — Other Ambulatory Visit: Payer: Self-pay | Admitting: Oncology

## 2010-11-18 ENCOUNTER — Encounter (HOSPITAL_BASED_OUTPATIENT_CLINIC_OR_DEPARTMENT_OTHER): Payer: Medicare Other | Admitting: Oncology

## 2010-11-18 DIAGNOSIS — D6949 Other primary thrombocytopenia: Secondary | ICD-10-CM

## 2010-11-18 LAB — CBC & DIFF AND RETIC
Basophils Absolute: 0.1 10*3/uL (ref 0.0–0.1)
Eosinophils Absolute: 0.3 10*3/uL (ref 0.0–0.5)
HGB: 15.1 g/dL (ref 11.6–15.9)
MCV: 89.8 fL (ref 79.5–101.0)
MONO%: 9.7 % (ref 0.0–14.0)
NEUT#: 2.1 10*3/uL (ref 1.5–6.5)
Platelets: 156 10*3/uL (ref 145–400)
RDW: 16 % — ABNORMAL HIGH (ref 11.2–14.5)
Retic Ct Abs: 54.72 10*3/uL (ref 18.30–72.70)

## 2010-11-18 LAB — MORPHOLOGY

## 2010-12-13 ENCOUNTER — Other Ambulatory Visit: Payer: Self-pay | Admitting: Oncology

## 2010-12-13 ENCOUNTER — Encounter (HOSPITAL_BASED_OUTPATIENT_CLINIC_OR_DEPARTMENT_OTHER): Payer: Medicare Other | Admitting: Oncology

## 2010-12-13 ENCOUNTER — Other Ambulatory Visit: Payer: Self-pay | Admitting: Nephrology

## 2010-12-13 DIAGNOSIS — M311 Thrombotic microangiopathy: Secondary | ICD-10-CM

## 2010-12-13 DIAGNOSIS — D6949 Other primary thrombocytopenia: Secondary | ICD-10-CM

## 2010-12-13 DIAGNOSIS — M479 Spondylosis, unspecified: Secondary | ICD-10-CM

## 2010-12-13 DIAGNOSIS — B370 Candidal stomatitis: Secondary | ICD-10-CM

## 2010-12-13 DIAGNOSIS — N186 End stage renal disease: Secondary | ICD-10-CM

## 2010-12-13 LAB — CBC WITH DIFFERENTIAL/PLATELET
BASO%: 0.6 % (ref 0.0–2.0)
Basophils Absolute: 0 10*3/uL (ref 0.0–0.1)
EOS%: 5.1 % (ref 0.0–7.0)
Eosinophils Absolute: 0.3 10*3/uL (ref 0.0–0.5)
HCT: 43.6 % (ref 34.8–46.6)
HGB: 15.7 g/dL (ref 11.6–15.9)
LYMPH%: 32.8 % (ref 14.0–49.7)
MCH: 33 pg (ref 25.1–34.0)
MCHC: 36 g/dL (ref 31.5–36.0)
MCV: 91.6 fL (ref 79.5–101.0)
MONO#: 0.5 10*3/uL (ref 0.1–0.9)
MONO%: 8.7 % (ref 0.0–14.0)
NEUT#: 3 10*3/uL (ref 1.5–6.5)
NEUT%: 52.8 % (ref 38.4–76.8)
Platelets: 136 10*3/uL — ABNORMAL LOW (ref 145–400)
RBC: 4.76 10*6/uL (ref 3.70–5.45)
RDW: 15.3 % — ABNORMAL HIGH (ref 11.2–14.5)
WBC: 5.7 10*3/uL (ref 3.9–10.3)
lymph#: 1.9 10*3/uL (ref 0.9–3.3)

## 2010-12-14 ENCOUNTER — Ambulatory Visit
Admission: RE | Admit: 2010-12-14 | Discharge: 2010-12-14 | Disposition: A | Discharge status: Home / Self Care | Payer: Medicare Other | Source: Ambulatory Visit | Attending: Nephrology | Admitting: Nephrology

## 2010-12-14 DIAGNOSIS — N186 End stage renal disease: Secondary | ICD-10-CM

## 2010-12-20 ENCOUNTER — Encounter (HOSPITAL_BASED_OUTPATIENT_CLINIC_OR_DEPARTMENT_OTHER): Payer: Medicare Other | Admitting: Oncology

## 2010-12-20 ENCOUNTER — Other Ambulatory Visit: Payer: Self-pay | Admitting: Oncology

## 2010-12-20 DIAGNOSIS — B370 Candidal stomatitis: Secondary | ICD-10-CM

## 2010-12-20 DIAGNOSIS — M311 Thrombotic microangiopathy, unspecified: Secondary | ICD-10-CM

## 2010-12-20 DIAGNOSIS — D6949 Other primary thrombocytopenia: Secondary | ICD-10-CM

## 2010-12-20 DIAGNOSIS — M479 Spondylosis, unspecified: Secondary | ICD-10-CM

## 2010-12-20 LAB — CBC WITH DIFFERENTIAL/PLATELET
BASO%: 0.3 % (ref 0.0–2.0)
Eosinophils Absolute: 0.2 10*3/uL (ref 0.0–0.5)
MCHC: 35.1 g/dL (ref 31.5–36.0)
MONO#: 0.6 10*3/uL (ref 0.1–0.9)
NEUT#: 3 10*3/uL (ref 1.5–6.5)
RBC: 4.85 10*6/uL (ref 3.70–5.45)
WBC: 5.7 10*3/uL (ref 3.9–10.3)
lymph#: 1.8 10*3/uL (ref 0.9–3.3)

## 2010-12-20 LAB — CHCC SMEAR

## 2010-12-30 ENCOUNTER — Ambulatory Visit
Admission: RE | Admit: 2010-12-30 | Discharge: 2010-12-30 | Disposition: A | Discharge status: Home / Self Care | Payer: Medicare Other | Source: Ambulatory Visit | Attending: Nephrology | Admitting: Nephrology

## 2010-12-30 ENCOUNTER — Other Ambulatory Visit: Payer: Self-pay | Admitting: Nephrology

## 2010-12-30 DIAGNOSIS — F17200 Nicotine dependence, unspecified, uncomplicated: Secondary | ICD-10-CM

## 2011-01-04 ENCOUNTER — Other Ambulatory Visit: Payer: Self-pay | Admitting: Oncology

## 2011-01-04 ENCOUNTER — Encounter (HOSPITAL_BASED_OUTPATIENT_CLINIC_OR_DEPARTMENT_OTHER): Payer: Medicare Other | Admitting: Oncology

## 2011-01-04 DIAGNOSIS — M311 Thrombotic microangiopathy, unspecified: Secondary | ICD-10-CM

## 2011-01-04 DIAGNOSIS — B370 Candidal stomatitis: Secondary | ICD-10-CM

## 2011-01-04 DIAGNOSIS — D6949 Other primary thrombocytopenia: Secondary | ICD-10-CM

## 2011-01-04 DIAGNOSIS — M479 Spondylosis, unspecified: Secondary | ICD-10-CM

## 2011-01-04 LAB — CBC WITH DIFFERENTIAL/PLATELET
Eosinophils Absolute: 0.2 10*3/uL (ref 0.0–0.5)
HCT: 43.9 % (ref 34.8–46.6)
LYMPH%: 31.6 % (ref 14.0–49.7)
MCV: 93.7 fL (ref 79.5–101.0)
MONO#: 0.5 10*3/uL (ref 0.1–0.9)
MONO%: 8.6 % (ref 0.0–14.0)
NEUT#: 3 10*3/uL (ref 1.5–6.5)
NEUT%: 54.9 % (ref 38.4–76.8)
Platelets: 153 10*3/uL (ref 145–400)
WBC: 5.4 10*3/uL (ref 3.9–10.3)

## 2011-01-12 ENCOUNTER — Other Ambulatory Visit: Payer: Self-pay | Admitting: Oncology

## 2011-01-12 ENCOUNTER — Encounter (HOSPITAL_BASED_OUTPATIENT_CLINIC_OR_DEPARTMENT_OTHER): Payer: Medicare Other | Admitting: Oncology

## 2011-01-12 DIAGNOSIS — B370 Candidal stomatitis: Secondary | ICD-10-CM

## 2011-01-12 DIAGNOSIS — M479 Spondylosis, unspecified: Secondary | ICD-10-CM

## 2011-01-12 DIAGNOSIS — D6949 Other primary thrombocytopenia: Secondary | ICD-10-CM

## 2011-01-12 DIAGNOSIS — M311 Thrombotic microangiopathy: Secondary | ICD-10-CM

## 2011-01-12 LAB — CBC WITH DIFFERENTIAL/PLATELET
BASO%: 0.4 % (ref 0.0–2.0)
Basophils Absolute: 0 10*3/uL (ref 0.0–0.1)
EOS%: 3 % (ref 0.0–7.0)
HGB: 15.5 g/dL (ref 11.6–15.9)
MCH: 33 pg (ref 25.1–34.0)
MCHC: 35.2 g/dL (ref 31.5–36.0)
MCV: 93.7 fL (ref 79.5–101.0)
MONO%: 8.1 % (ref 0.0–14.0)
NEUT%: 57.4 % (ref 38.4–76.8)
RDW: 14.1 % (ref 11.2–14.5)

## 2011-02-07 ENCOUNTER — Other Ambulatory Visit: Payer: Self-pay | Admitting: Oncology

## 2011-02-07 ENCOUNTER — Encounter (HOSPITAL_BASED_OUTPATIENT_CLINIC_OR_DEPARTMENT_OTHER): Payer: Medicare Other | Admitting: Oncology

## 2011-02-07 DIAGNOSIS — M311 Thrombotic microangiopathy: Secondary | ICD-10-CM

## 2011-02-07 DIAGNOSIS — M479 Spondylosis, unspecified: Secondary | ICD-10-CM

## 2011-02-07 DIAGNOSIS — B370 Candidal stomatitis: Secondary | ICD-10-CM

## 2011-02-07 DIAGNOSIS — D6949 Other primary thrombocytopenia: Secondary | ICD-10-CM

## 2011-02-07 LAB — CBC & DIFF AND RETIC
BASO%: 0.5 % (ref 0.0–2.0)
Basophils Absolute: 0 10*3/uL (ref 0.0–0.1)
Eosinophils Absolute: 0.3 10*3/uL (ref 0.0–0.5)
HCT: 45.5 % (ref 34.8–46.6)
HGB: 15.8 g/dL (ref 11.6–15.9)
Immature Retic Fract: 7.9 % (ref 1.60–10.00)
MCHC: 34.7 g/dL (ref 31.5–36.0)
MONO#: 0.4 10*3/uL (ref 0.1–0.9)
NEUT#: 3 10*3/uL (ref 1.5–6.5)
NEUT%: 54.1 % (ref 38.4–76.8)
WBC: 5.6 10*3/uL (ref 3.9–10.3)
lymph#: 1.9 10*3/uL (ref 0.9–3.3)

## 2011-02-07 LAB — CHCC SMEAR

## 2011-02-07 LAB — COMPREHENSIVE METABOLIC PANEL
BUN: 19 mg/dL (ref 6–23)
CO2: 30 mEq/L (ref 19–32)
Calcium: 10.4 mg/dL (ref 8.4–10.5)
Chloride: 102 mEq/L (ref 96–112)
Creatinine, Ser: 0.95 mg/dL (ref 0.50–1.10)
Total Bilirubin: 0.4 mg/dL (ref 0.3–1.2)

## 2011-02-07 LAB — MORPHOLOGY

## 2011-03-03 DIAGNOSIS — R7881 Bacteremia: Secondary | ICD-10-CM

## 2011-03-03 HISTORY — DX: Bacteremia: R78.81

## 2011-03-10 ENCOUNTER — Observation Stay: Payer: Self-pay | Admitting: Internal Medicine

## 2011-03-10 ENCOUNTER — Other Ambulatory Visit: Payer: Self-pay | Admitting: Physical Medicine and Rehabilitation

## 2011-03-10 ENCOUNTER — Inpatient Hospital Stay: Payer: Self-pay | Admitting: Internal Medicine

## 2011-03-10 DIAGNOSIS — M545 Low back pain: Secondary | ICD-10-CM

## 2011-03-11 ENCOUNTER — Telehealth: Payer: Self-pay | Admitting: *Deleted

## 2011-03-11 NOTE — Telephone Encounter (Signed)
Received vm call this am from husband stating pt has been battling a fever x 4d & is an inpt @ [facility] with infection & they have requested pt be transferred to [facility].   Discussed with Dr. Cyndie Chime & he has already talked with admitting MD at [facility] & doesn't feel that she needs to be transferred at this time. "She is septic & this is why her platelets are down." Daughter also called @ 1004am & was very upset.  Talked with her & she reports that Dr. Dava Najjar is the hospitalist & they may request a different hospitalist if communication doesn't improve.  She was told that Dr. Cyndie Chime has communicated with MD at [facility] & also gave them his cell # for questions but doesn't feel the pt needs to be transferred at this time.

## 2011-03-12 ENCOUNTER — Other Ambulatory Visit: Payer: Medicare Other

## 2011-03-15 DIAGNOSIS — IMO0001 Reserved for inherently not codable concepts without codable children: Secondary | ICD-10-CM

## 2011-03-15 HISTORY — DX: Reserved for inherently not codable concepts without codable children: IMO0001

## 2011-03-16 ENCOUNTER — Telehealth: Payer: Self-pay

## 2011-03-16 ENCOUNTER — Encounter (HOSPITAL_COMMUNITY)
Admission: AD | Disposition: A | Discharge status: Skilled Nursing Facility | Payer: Self-pay | Source: Ambulatory Visit | Attending: Internal Medicine

## 2011-03-16 ENCOUNTER — Inpatient Hospital Stay (HOSPITAL_COMMUNITY)
Admission: AD | Admit: 2011-03-16 | Discharge: 2011-03-23 | DRG: 853 | Disposition: A | Discharge status: Skilled Nursing Facility | Payer: Medicare Other | Source: Ambulatory Visit | Attending: Internal Medicine | Admitting: Internal Medicine

## 2011-03-16 ENCOUNTER — Encounter (HOSPITAL_COMMUNITY): Payer: Self-pay | Admitting: General Practice

## 2011-03-16 DIAGNOSIS — G062 Extradural and subdural abscess, unspecified: Secondary | ICD-10-CM

## 2011-03-16 DIAGNOSIS — I330 Acute and subacute infective endocarditis: Secondary | ICD-10-CM | POA: Diagnosis present

## 2011-03-16 DIAGNOSIS — Q2111 Secundum atrial septal defect: Secondary | ICD-10-CM

## 2011-03-16 DIAGNOSIS — F3289 Other specified depressive episodes: Secondary | ICD-10-CM | POA: Diagnosis present

## 2011-03-16 DIAGNOSIS — J4489 Other specified chronic obstructive pulmonary disease: Secondary | ICD-10-CM | POA: Diagnosis present

## 2011-03-16 DIAGNOSIS — Z961 Presence of intraocular lens: Secondary | ICD-10-CM

## 2011-03-16 DIAGNOSIS — F329 Major depressive disorder, single episode, unspecified: Secondary | ICD-10-CM | POA: Diagnosis present

## 2011-03-16 DIAGNOSIS — Q211 Atrial septal defect: Secondary | ICD-10-CM

## 2011-03-16 DIAGNOSIS — G061 Intraspinal abscess and granuloma: Secondary | ICD-10-CM | POA: Diagnosis present

## 2011-03-16 DIAGNOSIS — A419 Sepsis, unspecified organism: Secondary | ICD-10-CM | POA: Diagnosis present

## 2011-03-16 DIAGNOSIS — Z862 Personal history of diseases of the blood and blood-forming organs and certain disorders involving the immune mechanism: Secondary | ICD-10-CM

## 2011-03-16 DIAGNOSIS — Z79899 Other long term (current) drug therapy: Secondary | ICD-10-CM

## 2011-03-16 DIAGNOSIS — M519 Unspecified thoracic, thoracolumbar and lumbosacral intervertebral disc disorder: Secondary | ICD-10-CM | POA: Diagnosis present

## 2011-03-16 DIAGNOSIS — J441 Chronic obstructive pulmonary disease with (acute) exacerbation: Secondary | ICD-10-CM | POA: Diagnosis present

## 2011-03-16 DIAGNOSIS — F17200 Nicotine dependence, unspecified, uncomplicated: Secondary | ICD-10-CM | POA: Diagnosis present

## 2011-03-16 DIAGNOSIS — J449 Chronic obstructive pulmonary disease, unspecified: Secondary | ICD-10-CM | POA: Diagnosis present

## 2011-03-16 DIAGNOSIS — A409 Streptococcal sepsis, unspecified: Principal | ICD-10-CM | POA: Diagnosis present

## 2011-03-16 HISTORY — DX: Other chronic pain: G89.29

## 2011-03-16 HISTORY — DX: Disease of blood and blood-forming organs, unspecified: D75.9

## 2011-03-16 HISTORY — DX: Unspecified osteoarthritis, unspecified site: M19.90

## 2011-03-16 HISTORY — DX: Depression, unspecified: F32.A

## 2011-03-16 HISTORY — DX: Personal history of diseases of the blood and blood-forming organs and certain disorders involving the immune mechanism: Z86.2

## 2011-03-16 HISTORY — DX: Disorientation, unspecified: R41.0

## 2011-03-16 HISTORY — DX: Major depressive disorder, single episode, unspecified: F32.9

## 2011-03-16 HISTORY — DX: Chronic obstructive pulmonary disease, unspecified: J44.9

## 2011-03-16 HISTORY — DX: Encounter for other specified aftercare: Z51.89

## 2011-03-16 HISTORY — DX: Spinal stenosis, lumbar region without neurogenic claudication: M48.061

## 2011-03-16 HISTORY — DX: Anemia, unspecified: D64.9

## 2011-03-16 HISTORY — DX: Bacteremia: R78.81

## 2011-03-16 HISTORY — PX: LUMBAR LAMINECTOMY/DECOMPRESSION MICRODISCECTOMY: SHX5026

## 2011-03-16 HISTORY — DX: Dorsalgia, unspecified: M54.9

## 2011-03-16 HISTORY — DX: Reserved for inherently not codable concepts without codable children: IMO0001

## 2011-03-16 LAB — DIFFERENTIAL
Basophils Relative: 0 % (ref 0–1)
Lymphocytes Relative: 9 % — ABNORMAL LOW (ref 12–46)
Lymphs Abs: 0.9 10*3/uL (ref 0.7–4.0)
Monocytes Relative: 13 % — ABNORMAL HIGH (ref 3–12)
Neutro Abs: 8.1 10*3/uL — ABNORMAL HIGH (ref 1.7–7.7)
Neutrophils Relative %: 75 % (ref 43–77)

## 2011-03-16 LAB — CBC
HCT: 37 % (ref 36.0–46.0)
Hemoglobin: 12.5 g/dL (ref 12.0–15.0)
RBC: 3.95 MIL/uL (ref 3.87–5.11)
WBC: 10.8 10*3/uL — ABNORMAL HIGH (ref 4.0–10.5)

## 2011-03-16 SURGERY — LUMBAR LAMINECTOMY/DECOMPRESSION MICRODISCECTOMY
Anesthesia: General | Site: Back | Wound class: Dirty or Infected

## 2011-03-16 MED ORDER — GENTAMICIN IN SALINE 1.2-0.9 MG/ML-% IV SOLN
60.0000 mg | Freq: Three times a day (TID) | INTRAVENOUS | Status: DC
  Filled 2011-03-16 (×2): qty 50

## 2011-03-16 MED ORDER — ALBUTEROL SULFATE (5 MG/ML) 0.5% IN NEBU: 2.5000 mg | INHALATION_SOLUTION | RESPIRATORY_TRACT | Status: DC | PRN

## 2011-03-16 MED ORDER — ONDANSETRON HCL 4 MG/2ML IJ SOLN: 4.0000 mg | Freq: Four times a day (QID) | INTRAMUSCULAR | Status: DC | PRN

## 2011-03-16 MED ORDER — ONDANSETRON HCL 4 MG PO TABS: 4.0000 mg | ORAL_TABLET | Freq: Four times a day (QID) | ORAL | Status: DC | PRN

## 2011-03-16 MED ORDER — SODIUM CHLORIDE 0.9 % IV SOLN
1.0000 g | INTRAVENOUS | Status: DC
  Administered 2011-03-17 – 2011-03-22 (×32): 1 g via INTRAVENOUS
  Filled 2011-03-16 (×39): qty 1000

## 2011-03-16 MED ORDER — HYDROMORPHONE HCL PF 1 MG/ML IJ SOLN
1.0000 mg | INTRAMUSCULAR | Status: DC | PRN
  Administered 2011-03-17 – 2011-03-23 (×19): 1 mg via INTRAVENOUS
  Filled 2011-03-16 (×22): qty 1

## 2011-03-16 MED ORDER — NALOXONE HCL 0.4 MG/ML IJ SOLN: 0.2000 mg | INTRAMUSCULAR | Status: DC | PRN

## 2011-03-16 MED ORDER — HYDROMORPHONE HCL PF 1 MG/ML IJ SOLN
INTRAMUSCULAR | Status: AC
  Filled 2011-03-16: qty 1

## 2011-03-16 MED ORDER — FLEET ENEMA 7-19 GM/118ML RE ENEM
1.0000 | ENEMA | Freq: Every day | RECTAL | Status: DC | PRN
  Filled 2011-03-16: qty 1

## 2011-03-16 MED ORDER — DEXTROSE-NACL 5-0.45 % IV SOLN
INTRAVENOUS | Status: DC
  Administered 2011-03-17: 01:00:00 via INTRAVENOUS

## 2011-03-16 MED ORDER — IPRATROPIUM BROMIDE 0.02 % IN SOLN
0.5000 mg | Freq: Four times a day (QID) | RESPIRATORY_TRACT | Status: DC
  Filled 2011-03-16: qty 2.5

## 2011-03-16 MED ORDER — NICOTINE 21 MG/24HR TD PT24
21.0000 mg | MEDICATED_PATCH | Freq: Every day | TRANSDERMAL | Status: DC
  Administered 2011-03-17 – 2011-03-23 (×7): 21 mg via TRANSDERMAL
  Filled 2011-03-16 (×7): qty 1

## 2011-03-16 SURGICAL SUPPLY — 59 items
ADH SKN CLS APL DERMABOND .7 (GAUZE/BANDAGES/DRESSINGS) ×1
APL SKNCLS STERI-STRIP NONHPOA (GAUZE/BANDAGES/DRESSINGS) ×1
BAG DECANTER FOR FLEXI CONT (MISCELLANEOUS) ×2 IMPLANT
BENZOIN TINCTURE PRP APPL 2/3 (GAUZE/BANDAGES/DRESSINGS) ×2 IMPLANT
BLADE SURG 11 STRL SS (BLADE) ×2 IMPLANT
BLADE SURG ROTATE 9660 (MISCELLANEOUS) IMPLANT
BRUSH SCRUB EZ PLAIN DRY (MISCELLANEOUS) ×2 IMPLANT
BUR MATCHSTICK NEURO 3.0X3.8 (BURR) ×1 IMPLANT
BUR PRECISION FLUTE 6.0 (BURR) ×2 IMPLANT
CANISTER SUCTION 2500CC (MISCELLANEOUS) ×2 IMPLANT
CLOTH BEACON ORANGE TIMEOUT ST (SAFETY) ×2 IMPLANT
CONT SPEC 4OZ CLIKSEAL STRL BL (MISCELLANEOUS) ×2 IMPLANT
DECANTER SPIKE VIAL GLASS SM (MISCELLANEOUS) ×1 IMPLANT
DERMABOND ADVANCED (GAUZE/BANDAGES/DRESSINGS) ×1
DERMABOND ADVANCED .7 DNX12 (GAUZE/BANDAGES/DRESSINGS) ×1 IMPLANT
DRAPE LAPAROTOMY 100X72X124 (DRAPES) ×2 IMPLANT
DRAPE MICROSCOPE ZEISS OPMI (DRAPES) ×1 IMPLANT
DRAPE POUCH INSTRU U-SHP 10X18 (DRAPES) ×2 IMPLANT
DRAPE PROXIMA HALF (DRAPES) IMPLANT
DRAPE SURG 17X23 STRL (DRAPES) ×2 IMPLANT
DRSG OPSITE 4X5.5 SM (GAUZE/BANDAGES/DRESSINGS) ×3 IMPLANT
ELECT REM PT RETURN 9FT ADLT (ELECTROSURGICAL) ×2
ELECTRODE REM PT RTRN 9FT ADLT (ELECTROSURGICAL) ×1 IMPLANT
EVACUATOR 3/16  PVC DRAIN (DRAIN) ×1
EVACUATOR 3/16 PVC DRAIN (DRAIN) ×1 IMPLANT
GAUZE SPONGE 4X4 16PLY XRAY LF (GAUZE/BANDAGES/DRESSINGS) IMPLANT
GLOVE BIO SURGEON STRL SZ 6.5 (GLOVE) ×1 IMPLANT
GLOVE BIO SURGEON STRL SZ7 (GLOVE) ×4 IMPLANT
GLOVE BIO SURGEON STRL SZ8 (GLOVE) ×2 IMPLANT
GLOVE ECLIPSE 7.5 STRL STRAW (GLOVE) IMPLANT
GLOVE EXAM NITRILE LRG STRL (GLOVE) IMPLANT
GLOVE EXAM NITRILE MD LF STRL (GLOVE) IMPLANT
GLOVE EXAM NITRILE XL STR (GLOVE) IMPLANT
GLOVE EXAM NITRILE XS STR PU (GLOVE) IMPLANT
GLOVE INDICATOR 8.5 STRL (GLOVE) ×2 IMPLANT
GOWN BRE IMP SLV AUR LG STRL (GOWN DISPOSABLE) ×2 IMPLANT
GOWN BRE IMP SLV AUR XL STRL (GOWN DISPOSABLE) ×4 IMPLANT
GOWN STRL REIN 2XL LVL4 (GOWN DISPOSABLE) IMPLANT
KIT BASIN OR (CUSTOM PROCEDURE TRAY) ×2 IMPLANT
KIT ROOM TURNOVER OR (KITS) ×2 IMPLANT
NDL SPNL 22GX3.5 QUINCKE BK (NEEDLE) ×1 IMPLANT
NEEDLE HYPO 22GX1.5 SAFETY (NEEDLE) ×2 IMPLANT
NEEDLE SPNL 22GX3.5 QUINCKE BK (NEEDLE) ×2 IMPLANT
NS IRRIG 1000ML POUR BTL (IV SOLUTION) ×2 IMPLANT
PACK LAMINECTOMY NEURO (CUSTOM PROCEDURE TRAY) ×2 IMPLANT
PATTIES SURGICAL 1X1 (DISPOSABLE) ×1 IMPLANT
RUBBERBAND STERILE (MISCELLANEOUS) ×2 IMPLANT
SPONGE GAUZE 4X4 12PLY (GAUZE/BANDAGES/DRESSINGS) ×2 IMPLANT
SPONGE LAP 4X18 X RAY DECT (DISPOSABLE) ×1 IMPLANT
SPONGE SURGIFOAM ABS GEL SZ50 (HEMOSTASIS) ×2 IMPLANT
STRIP CLOSURE SKIN 1/2X4 (GAUZE/BANDAGES/DRESSINGS) ×3 IMPLANT
SUT VIC AB 0 CT1 18XCR BRD8 (SUTURE) ×1 IMPLANT
SUT VIC AB 0 CT1 8-18 (SUTURE) ×2
SUT VIC AB 2-0 CT1 18 (SUTURE) ×2 IMPLANT
SUT VICRYL 4-0 PS2 18IN ABS (SUTURE) ×2 IMPLANT
SYR 20ML ECCENTRIC (SYRINGE) ×2 IMPLANT
TOWEL OR 17X24 6PK STRL BLUE (TOWEL DISPOSABLE) ×3 IMPLANT
TOWEL OR 17X26 10 PK STRL BLUE (TOWEL DISPOSABLE) ×2 IMPLANT
WATER STERILE IRR 1000ML POUR (IV SOLUTION) ×2 IMPLANT

## 2011-03-16 NOTE — Progress Notes (Signed)
1830 Patient arrived to floor via [facility] from [facility]. Upon arrival to floor patient sleeping. Oxygen intact at 2 liters nasal cannula. Dr. Cleotis Lema made aware of patient arrival to floor stated she will be up to  Write orders. Daughter at bedside.P.Wiliams,RN 1845 Dr.Abdullah in to see patient.

## 2011-03-16 NOTE — Telephone Encounter (Signed)
Message left for pt or spouse to return call with update on pt's condition after recent hospitalization. dph

## 2011-03-16 NOTE — H&P (Signed)
PCP:  Provider Not In System   DOA:  03/16/2011  6:11 PM  Chief Complaint:  Severe back pain Abnormal MRI spine findings HPI: This is a 70 years old Caucasian woman who was transferred from [facility] for higher level of care after her MRI spine showed evidence of epidural abscess. No discharge summary is available. Patient is currently lethargic and unable to provide any history. History is obtained from daughter and husband and packets of information  [facility].  At present the patient presented to this hospital on 11/ 7 with chief complaint of back pain and fever she was discharged on November 8 however because of worsening back pain and lower extremity weakness patient was [facility]. She was admitted with the diagnosis of fever and altered mental status. Blood cultures grew enteococcus speciesourcewas initially thought to be UTI patient was treated with ampicillin and gentamicin as the infection was disease recommendation. further workup including MRI of the spine showed epidural abscess at the level of L1-L3 with severe mass effect on the thecal sac at the level of L3 and wouldn't mass effect on the thecal sac at the level of L2. Patient was sent to [facility] for further evaluation and management.  Allergies: Allergies  Allergen Reactions  . Other     Wool: Reaction is hives Advertising account executive tape: Reaction is blistering  . Sulfa Antibiotics Hives        Prior to Admission medications   Medication Sig Start Date End Date Taking? Authorizing Provider  albuterol (PROVENTIL HFA;VENTOLIN HFA) 108 (90 BASE) MCG/ACT inhaler Inhale 2 puffs into the lungs every 6 (six) hours as needed. For shortness of breath    Yes Historical Provider, MD  albuterol (PROVENTIL) (2.5 MG/3ML) 0.083% nebulizer solution Take 2.5 mg by nebulization every 6 (six) hours as needed. For wheezing    Yes Historical Provider, MD  ALPRAZolam (XANAX) 0.25 MG  tablet Take 0.25 mg by mouth 3 (three) times daily as needed. For pain and anxiety    Yes Historical Provider, MD  cyclobenzaprine (FLEXERIL) 10 MG tablet Take 10 mg by mouth 3 (three) times daily as needed. For pain/muscle spasms    Yes Historical Provider, MD  FLUoxetine (PROZAC) 20 MG capsule Take 20 mg by mouth daily.     Yes Historical Provider, MD  gabapentin (NEURONTIN) 800 MG tablet Take 800 mg by mouth 4 (four) times daily.     Yes Historical Provider, MD  meloxicam (MOBIC) 7.5 MG tablet Take 7.5 mg by mouth daily.     Yes Historical Provider, MD  oxyCODONE-acetaminophen (PERCOCET) 7.5-325 MG per tablet Take 1 tablet by mouth every 4 (four) hours as needed. For pain    Yes Historical Provider, MD  rosuvastatin (CRESTOR) 20 MG tablet Take 20 mg by mouth daily.     Yes Historical Provider, MD  tiotropium (SPIRIVA) 18 MCG inhalation capsule Place 18 mcg into inhaler and inhale daily.     Yes Historical Provider, MD    Past Medical History  Diagnosis Date  . Septicemia 03/2011    03/15/11  . History of TTP (thrombotic thrombocytopenic purpura)   . Acute delirium     03/2011  . COPD (chronic obstructive pulmonary disease)       . Anemia   . Arthritis   . Chronic back pain   . Spinal stenosis, lumbar       . Depression  Past Surgical History  Procedure Date  . Cesarean section 10/09/1976  . Tonsillectomy   . Tubal ligation   . Cataract extraction w/ intraocular lens  implant, bilateral ~ 2010  . Eye surgery     Social History:  reports that she has been smoking.  She has never used smokeless tobacco. She reports that she drinks alcohol. She reports that she does not use illicit drugs.  History reviewed. No pertinent family history.  Review of Systems:  Unable to obtain given-current patient's mental status.  However please refer to HPI for more details.    Physical Exam:  Filed Vitals:   03/16/11 1847  BP: 117/69  Pulse: 95  Temp: 98.6 F (37 C)    TempSrc: Oral  Resp: 12  Weight: 76.6 kg (168 lb 14 oz)  SpO2: 91%    Constitutional: Vital signs reviewed.  She was very lethargic, however open her eyes to name calling.   Neck: Supple, Trachea midline normal ROM, No JVD,  Cardiovascular: RRR, S1 normal, S2 normal, no MRG, pulses symmetric and intact bilaterally Pulmonary/Chest: CTAB, no wheezes, rales, or rhonchi Abdominal: Soft. Non-tender, non-distended, bowel sounds are normal, no masses, organomegaly, or guarding present.  GU: no CVA tenderness Ext: no edema and no cyanosis, pulses palpable bilaterally (DP and PT) Neurological:  lethargic moves her lower extremities from side to side  On request. stated that she is able to feel light touch however exam is very limited given her mental status. Labs on Admission:  Last dated November 13 from [facility] hospital records  WBCs 8.5, hemoglobin 11.6, hematocrit 33.8, platelet count on November 11 was 102. Creatinine 0.8, sodium 143, potassium 3.8.  Radiological Exams on Admission: MRI of the lumbar spine showed: Multiple peripherally  enhancing fluid densities posterior to L1-L3 Concerning for epidural abscesses. Causing severe mass effect at the on the thecal sac at the level of L3 and moderate mass effect on the thecal sac at the level of L2. #2 bone marrow edema in the L3 and L4 vertebral bodies as well as fluid signal in the L3-L4 disc space. Findings are concerning for discitis/osteomyelitis.  Suggestion of enhancement surrounding the visualized lower spinal cord raise the possibility of meningitis. Multi-level degenerative disc and facet disease which goes multilevel canal stenosis greatest at L4-L5 where it is sever. Indeterminate, incompletely visualized large cystic structure in the pelvis. Further evaluation with ultrasound or CT is recommended.  Assessment/Plan Principal Problem:  *Epidural abscess Active Problems:  Sepsis  History of TTP (thrombotic thrombocytopenic  purpura)  COPD (chronic obstructive pulmonary disease) Tobacco Abuse.  Plan : Patient will be admitted to ICU. Dr. Jonelle Sidle from neurosurgery was consulted, he reviewed the MRI CD and recommended urgent surgical intervention.  Dr. Effie Shy from infectious disease service was consulted. We'll continue with ampicillin and gentamicin for now pending ID evaluation.  Patient will be kept n.p.o. given her current mental status. Will order for IV fluid. Speech and swallow consultation.  Husband and a daughter were informed with the above plan and they are agreeable. Patient is full code.  Time Spent on Admission: 2 hours , including reviewing old records, assessment of patient, obtaining history, communicating with consultants.  Lashawndra Lampkins 03/16/2011, 7:55 PM

## 2011-03-16 NOTE — Consult Note (Signed)
Reason for Consult: Back pain bilateral leg pain and weakness epidural abscess Referring Physician: CURTIS URIARTE is an 70 y.o. female.  HPI: Patient is a 70 year old female who presents in transfer from [facility] for evaluation of fever back pain epidural abscess. She was admitted as adamant on November 8 for workup of fever chills and delirium at that time she was noted be in a severe amount of pain and had difficulty ambulating. Since then the hospital neurologically she remained relatively stable however pain has become more problematic for her. She's been maintained on IV narcotics and has suffered significant confusion from this. Cultures were obtained and apparently showed enterococcus and a since she's been treated with IV antibiotics s for over a week. MRI obtained yesterday did reveal the epidural abscess the patient was subsequently transferred to the hospitalist service here for evaluation management.  Past Medical History  Diagnosis Date  . Septicemia 03/2011  . Normal echocardiogram 03/15/11  . History of TTP (thrombotic thrombocytopenic purpura)   . Acute delirium   . Bacteremia 03/2011  . COPD (chronic obstructive pulmonary disease)   . Blood transfusion   . Anemia   . Arthritis   . Chronic back pain   . Spinal stenosis, lumbar   . Blood dyscrasia   . Depression     "mild"    Past Surgical History  Procedure Date  . Cesarean section 10/09/1976  . Tonsillectomy   . Tubal ligation   . Cataract extraction w/ intraocular lens  implant, bilateral ~ 2010  . Eye surgery     History reviewed. No pertinent family history.  Social History:  reports that she has been smoking.  She has never used smokeless tobacco. She reports that she drinks alcohol. She reports that she does not use illicit drugs.  Allergies:  Allergies  Allergen Reactions  . Other     Wool: Reaction is hives Advertising account executive tape: Reaction is blistering  .  Sulfa Antibiotics Hives  . Sulfa Drugs Cross Reactors     Medications: I have reviewed the patient's current medications.  No results found for this or any previous visit (from the past 48 hour(s)).  No results found.  Review of Systems  Constitutional: Positive for fever, chills, malaise/fatigue and diaphoresis.  Cardiovascular: Positive for leg swelling.  Musculoskeletal: Positive for back pain and joint pain.  Neurological: Positive for dizziness, tremors and weakness.  Psychiatric/Behavioral: Negative.    Blood pressure 117/69, pulse 95, temperature 98.6 F (37 C), temperature source Oral, resp. rate 12, weight 76.6 kg (168 lb 14 oz), SpO2 91.00%. Physical Exam  Neurological: She is disoriented. She exhibits abnormal muscle tone.  Reflex Scores:      Patellar reflexes are 0 on the right side and 0 on the left side.      Achilles reflexes are 0 on the right side and 0 on the left side.      The patient is confused and somewhat delirious trim and tremulous. She does follow commands and moves all extremities spontaneously but is very difficult to examine secondary to her confusion. She appears to have some weakness iliopsoas at about 4+ out of 5. Quads also appear to be  weak at 4+ out of 5. Distally she is 5 out of 5 in her anterior talus gastrocs. Sensory exam is very difficult to assess secondary to her confusion. Reflexes are decreased and knee jerks and ankle jerks. Operative knees appear to be intact at 5  out of 5.    Assessment/Plan: 70 year old female with back and bilateral leg pain an MRI showing epidural abscess behind the vertebral body of L2 and L3 causing severe thecal sac compression spinal stenosis at this level. Patient has had cultures identify the bacteria to enter coccus and she is on antibiotics for this the she has been seen by infectious disease she is currently on the hospitalist service and they'll be monitoring entailing imbibes accordingly. Due to the size and  location of the abscesses, her proximal weakness, and degree of spinal stenosis I have recommended a decompressive laminectomy. I've explained the risks and benefits of the operation expected on outcomes alternatives and perioperative course to the patient's family daughter and husband. This is a patient of Dr. Reva Bores and Dr. Regino Schultze at [facility] all the notifying them of her admission.  Romaldo Saville P 03/16/2011, 10:08 PM

## 2011-03-16 NOTE — Progress Notes (Addendum)
ANTIBIOTIC CONSULT NOTE - INITIAL  Pharmacy Consult for Gentanicin Indication: abscess  Allergies  Allergen Reactions  . Other     Wool: Reaction is hives Advertising account executive tape: Reaction is blistering  . Sulfa Antibiotics Hives  . Sulfa Drugs Cross Reactors     Patient Measurements: Weight: 175 lb 0.7 oz (79.4 kg)   Vital Signs: Temp: 98.3 F (36.8 C) (11/14 2240) Temp src: Oral (11/14 2240) BP: 117/69 mmHg (11/14 1847) Pulse Rate: 95  (11/14 1847) Intake/Output from previous day:   Intake/Output from this shift:    Labs:SrCr 0.8 from [facility]   Microbiology: No results found for this or any previous visit (from the past 720 hour(s)).  Medical History: Past Medical History  Diagnosis Date  . Septicemia 03/2011  . Normal echocardiogram 03/15/11  . History of TTP (thrombotic thrombocytopenic purpura)   . Acute delirium   . Bacteremia 03/2011  . COPD (chronic obstructive pulmonary disease)   . Blood transfusion   . Anemia   . Arthritis   . Chronic back pain   . Spinal stenosis, lumbar   . Blood dyscrasia   . Depression     "mild"    Medications:  Prescriptions prior to admission  Medication Sig Dispense Refill  . albuterol (PROVENTIL HFA;VENTOLIN HFA) 108 (90 BASE) MCG/ACT inhaler Inhale 2 puffs into the lungs every 6 (six) hours as needed. For shortness of breath       . albuterol (PROVENTIL) (2.5 MG/3ML) 0.083% nebulizer solution Take 2.5 mg by nebulization every 6 (six) hours as needed. For wheezing       . ALPRAZolam (XANAX) 0.25 MG tablet Take 0.25 mg by mouth 3 (three) times daily as needed. For pain and anxiety       . cyclobenzaprine (FLEXERIL) 10 MG tablet Take 10 mg by mouth 3 (three) times daily as needed. For pain/muscle spasms       . FLUoxetine (PROZAC) 20 MG capsule Take 20 mg by mouth daily.        Marland Kitchen gabapentin (NEURONTIN) 800 MG tablet Take 800 mg by mouth 4 (four) times daily.        . meloxicam (MOBIC) 7.5 MG tablet Take 7.5 mg  by mouth daily.        Marland Kitchen oxyCODONE-acetaminophen (PERCOCET) 7.5-325 MG per tablet Take 1 tablet by mouth every 4 (four) hours as needed. For pain       . rosuvastatin (CRESTOR) 20 MG tablet Take 20 mg by mouth daily.        Marland Kitchen tiotropium (SPIRIVA) 18 MCG inhalation capsule Place 18 mcg into inhaler and inhale daily.         Assessment: 70 yo transferred from [facility] for epidural abscess.  Currently on ampicillin for reported enterococcus.  Gent to be added for synergy.  Goal of Therapy:  Gentamicin trough level <2 mcg/ml  Plan:  Pt was receiving gent 60 q12 at [place].  Next dose would be due 0630 11/15.  Will check gent trough prior to next dose.  Chenoa Luddy Poteet 03/16/2011,11:37 PM

## 2011-03-17 ENCOUNTER — Inpatient Hospital Stay (HOSPITAL_COMMUNITY): Payer: Medicare Other

## 2011-03-17 ENCOUNTER — Encounter (HOSPITAL_COMMUNITY): Payer: Self-pay

## 2011-03-17 DIAGNOSIS — G062 Extradural and subdural abscess, unspecified: Secondary | ICD-10-CM

## 2011-03-17 LAB — COMPREHENSIVE METABOLIC PANEL
ALT: 48 U/L — ABNORMAL HIGH (ref 0–35)
Alkaline Phosphatase: 49 U/L (ref 39–117)
BUN: 14 mg/dL (ref 6–23)
CO2: 29 mEq/L (ref 19–32)
CO2: 30 mEq/L (ref 19–32)
Calcium: 9.6 mg/dL (ref 8.4–10.5)
Calcium: 9.8 mg/dL (ref 8.4–10.5)
Creatinine, Ser: 0.73 mg/dL (ref 0.50–1.10)
GFR calc Af Amer: >90 mL/min (ref >90)
GFR calc Af Amer: >90 mL/min (ref >90)
GFR calc non Af Amer: 86 mL/min — ABNORMAL LOW (ref >90)
GFR calc non Af Amer: 87 mL/min — ABNORMAL LOW (ref >90)
Glucose, Bld: 109 mg/dL — ABNORMAL HIGH (ref 70–99)
Glucose, Bld: 101 mg/dL — ABNORMAL HIGH (ref 70–99)
Sodium: 142 mEq/L (ref 135–145)
Total Protein: 6.8 g/dL (ref 6.0–8.3)

## 2011-03-17 LAB — GRAM STAIN

## 2011-03-17 LAB — APTT: aPTT: 43 seconds — ABNORMAL HIGH (ref 24–37)

## 2011-03-17 LAB — POCT I-STAT 4, (NA,K, GLUC, HGB,HCT)
Glucose, Bld: 108 mg/dL — ABNORMAL HIGH (ref 70–99)
Potassium: 4.1 mEq/L (ref 3.5–5.1)

## 2011-03-17 LAB — CBC
HCT: 28.8 % — ABNORMAL LOW (ref 36.0–46.0)
HCT: 31.5 % — ABNORMAL LOW (ref 36.0–46.0)
Hemoglobin: 9.5 g/dL — ABNORMAL LOW (ref 12.0–15.0)
Hemoglobin: 10.5 g/dL — ABNORMAL LOW (ref 12.0–15.0)
MCH: 31.1 pg (ref 26.0–34.0)
MCV: 93.2 fL (ref 78.0–100.0)
RBC: 3.12 MIL/uL — ABNORMAL LOW (ref 3.87–5.11)
RBC: 3.38 MIL/uL — ABNORMAL LOW (ref 3.87–5.11)
WBC: 10.2 10*3/uL (ref 4.0–10.5)

## 2011-03-17 LAB — GLUCOSE, CAPILLARY
Glucose-Capillary: 105 mg/dL — ABNORMAL HIGH (ref 70–99)
Glucose-Capillary: 138 mg/dL — ABNORMAL HIGH (ref 70–99)

## 2011-03-17 LAB — PROTIME-INR: INR: 1.37 (ref 0.00–1.49)

## 2011-03-17 LAB — TYPE AND SCREEN

## 2011-03-17 MED ORDER — PHENOL 1.4 % MT LIQD
1.0000 | OROMUCOSAL | Status: DC | PRN
  Filled 2011-03-17: qty 177

## 2011-03-17 MED ORDER — ACETAMINOPHEN 650 MG RE SUPP: 650.0000 mg | RECTAL | Status: DC | PRN

## 2011-03-17 MED ORDER — CEFAZOLIN SODIUM 1-5 GM-% IV SOLN
1.0000 g | Freq: Three times a day (TID) | INTRAVENOUS | Status: AC
  Administered 2011-03-17 (×2): 1 g via INTRAVENOUS
  Filled 2011-03-17 (×2): qty 50

## 2011-03-17 MED ORDER — LIDOCAINE-EPINEPHRINE 1 %-1:100000 IJ SOLN
INTRAMUSCULAR | Status: DC | PRN
  Administered 2011-03-17: 10 mL

## 2011-03-17 MED ORDER — THROMBIN 5000 UNITS EX KIT
PACK | OROMUCOSAL | Status: DC | PRN
  Administered 2011-03-17: 05:00:00 via TOPICAL

## 2011-03-17 MED ORDER — SODIUM CHLORIDE 0.9 % IJ SOLN: 3.0000 mL | INTRAMUSCULAR | Status: DC | PRN

## 2011-03-17 MED ORDER — SODIUM CHLORIDE 0.9 % IV SOLN: 250.0000 mL | INTRAVENOUS | Status: DC

## 2011-03-17 MED ORDER — GENTAMICIN IN SALINE 1.2-0.9 MG/ML-% IV SOLN
60.0000 mg | Freq: Two times a day (BID) | INTRAVENOUS | Status: DC
  Administered 2011-03-17 – 2011-03-23 (×12): 60 mg via INTRAVENOUS
  Filled 2011-03-17 (×16): qty 50

## 2011-03-17 MED ORDER — ROSUVASTATIN CALCIUM 20 MG PO TABS
20.0000 mg | ORAL_TABLET | Freq: Every day | ORAL | Status: DC
  Administered 2011-03-17 – 2011-03-23 (×6): 20 mg via ORAL
  Filled 2011-03-17 (×8): qty 1

## 2011-03-17 MED ORDER — VECURONIUM BROMIDE 10 MG IV SOLR
INTRAVENOUS | Status: DC | PRN
  Administered 2011-03-17: 3 mg via INTRAVENOUS
  Administered 2011-03-17: 2 mg via INTRAVENOUS

## 2011-03-17 MED ORDER — SODIUM CHLORIDE 0.9 % IJ SOLN
3.0000 mL | Freq: Two times a day (BID) | INTRAMUSCULAR | Status: DC
  Administered 2011-03-17 – 2011-03-23 (×13): 3 mL via INTRAVENOUS

## 2011-03-17 MED ORDER — SODIUM CHLORIDE 0.9 % IV SOLN
10.0000 mg | INTRAVENOUS | Status: DC | PRN
  Administered 2011-03-17: 60 ug/min via INTRAVENOUS

## 2011-03-17 MED ORDER — ROCURONIUM BROMIDE 100 MG/10ML IV SOLN
INTRAVENOUS | Status: DC | PRN
  Administered 2011-03-17: 50 mg via INTRAVENOUS

## 2011-03-17 MED ORDER — ONDANSETRON HCL 4 MG/2ML IJ SOLN: 4.0000 mg | INTRAMUSCULAR | Status: DC | PRN

## 2011-03-17 MED ORDER — EPHEDRINE SULFATE 50 MG/ML IJ SOLN
INTRAMUSCULAR | Status: DC | PRN
  Administered 2011-03-17 (×2): 10 mg via INTRAVENOUS

## 2011-03-17 MED ORDER — FLUOXETINE HCL 20 MG PO CAPS
20.0000 mg | ORAL_CAPSULE | Freq: Every day | ORAL | Status: DC
  Administered 2011-03-17 – 2011-03-23 (×7): 20 mg via ORAL
  Filled 2011-03-17 (×7): qty 1

## 2011-03-17 MED ORDER — LACTATED RINGERS IV SOLN
INTRAVENOUS | Status: DC | PRN
  Administered 2011-03-17 (×5): via INTRAVENOUS

## 2011-03-17 MED ORDER — VANCOMYCIN HCL 1000 MG IV SOLR
1000.0000 mg | INTRAVENOUS | Status: DC | PRN
  Administered 2011-03-17: 1 g via INTRAVENOUS

## 2011-03-17 MED ORDER — THROMBIN 5000 UNITS EX KIT
PACK | CUTANEOUS | Status: DC | PRN
  Administered 2011-03-17: 20000 [IU] via TOPICAL
  Administered 2011-03-17 (×2): 5000 [IU] via TOPICAL

## 2011-03-17 MED ORDER — ACETAMINOPHEN 325 MG PO TABS
650.0000 mg | ORAL_TABLET | ORAL | Status: DC | PRN
  Administered 2011-03-18 – 2011-03-22 (×3): 650 mg via ORAL
  Filled 2011-03-17: qty 2
  Filled 2011-03-17: qty 1
  Filled 2011-03-17 (×2): qty 2

## 2011-03-17 MED ORDER — GABAPENTIN 400 MG PO CAPS
400.0000 mg | ORAL_CAPSULE | Freq: Three times a day (TID) | ORAL | Status: DC
  Administered 2011-03-17 – 2011-03-23 (×20): 400 mg via ORAL
  Filled 2011-03-17 (×21): qty 1

## 2011-03-17 MED ORDER — CYCLOBENZAPRINE HCL 10 MG PO TABS
10.0000 mg | ORAL_TABLET | Freq: Three times a day (TID) | ORAL | Status: DC | PRN
  Administered 2011-03-17 – 2011-03-22 (×7): 10 mg via ORAL
  Filled 2011-03-17 (×9): qty 1

## 2011-03-17 MED ORDER — LIDOCAINE HCL 1 % IJ SOLN
INTRAMUSCULAR | Status: DC | PRN
  Administered 2011-03-17: 40 mg via INTRADERMAL

## 2011-03-17 MED ORDER — DOCUSATE SODIUM 100 MG PO CAPS
100.0000 mg | ORAL_CAPSULE | Freq: Two times a day (BID) | ORAL | Status: DC
  Administered 2011-03-17 – 2011-03-23 (×12): 100 mg via ORAL
  Filled 2011-03-17 (×12): qty 1

## 2011-03-17 MED ORDER — GABAPENTIN 800 MG PO TABS
400.0000 mg | ORAL_TABLET | Freq: Three times a day (TID) | ORAL | Status: DC
  Filled 2011-03-17 (×3): qty 1

## 2011-03-17 MED ORDER — SODIUM CHLORIDE 0.9 % IR SOLN
Status: DC | PRN
  Administered 2011-03-17: 03:00:00

## 2011-03-17 MED ORDER — ALPRAZOLAM 0.25 MG PO TABS
0.2500 mg | ORAL_TABLET | Freq: Three times a day (TID) | ORAL | Status: DC | PRN
  Administered 2011-03-18 – 2011-03-20 (×2): 0.25 mg via ORAL
  Filled 2011-03-17 (×2): qty 1

## 2011-03-17 MED ORDER — FENTANYL CITRATE 0.05 MG/ML IJ SOLN
INTRAMUSCULAR | Status: DC | PRN
  Administered 2011-03-17 (×3): 50 ug via INTRAVENOUS
  Administered 2011-03-17: 100 ug via INTRAVENOUS

## 2011-03-17 MED ORDER — VANCOMYCIN HCL IN DEXTROSE 1-5 GM/200ML-% IV SOLN
1000.0000 mg | INTRAVENOUS | Status: AC
  Filled 2011-03-17: qty 200

## 2011-03-17 MED ORDER — PROPOFOL 10 MG/ML IV EMUL
INTRAVENOUS | Status: DC | PRN
  Administered 2011-03-17: 50 mg via INTRAVENOUS
  Administered 2011-03-17: 150 mg via INTRAVENOUS

## 2011-03-17 MED ORDER — HEMOSTATIC AGENTS (NO CHARGE) OPTIME
TOPICAL | Status: DC | PRN
  Administered 2011-03-17: 1 via TOPICAL

## 2011-03-17 MED ORDER — MENTHOL 3 MG MT LOZG
1.0000 | LOZENGE | OROMUCOSAL | Status: DC | PRN
  Filled 2011-03-17: qty 9

## 2011-03-17 NOTE — Anesthesia Procedure Notes (Signed)
Procedure Name: Intubation Date/Time: 03/17/2011 2:40 AM Performed by: Alanda Amass Pre-anesthesia Checklist: Patient identified, Timeout performed, Emergency Drugs available, Suction available and Patient being monitored Patient Re-evaluated:Patient Re-evaluated prior to inductionOxygen Delivery Method: Circle System Utilized Preoxygenation: Pre-oxygenation with 100% oxygen Intubation Type: IV induction Ventilation: Mask ventilation without difficulty Laryngoscope Size: Mac and 3 Grade View: Grade II Tube type: Oral Tube size: 7.5 mm Number of attempts: 1 Placement Confirmation: ETT inserted through vocal cords under direct vision Secured at: 21 cm Tube secured with: Tape Dental Injury: Teeth and Oropharynx as per pre-operative assessment

## 2011-03-17 NOTE — Progress Notes (Signed)
Subjective: Patient seen and examined ,awake ,slightly drowsy ,oriented x 2.C/O back pain 10/10.  Objective: Vital signs in last 24 hours: Temp:  [97.9 F (36.6 C)-98.6 F (37 C)] 97.9 F (36.6 C) (11/15 0818) Pulse Rate:  [95-103] 103  (11/15 0100) Resp:  [12-15] 15  (11/15 0100) BP: (117)/(69) 117/69 mmHg (11/14 1847) SpO2:  [91 %] 91 % (11/15 0100) Weight:  [76.6 kg (168 lb 14 oz)-79.4 kg (175 lb 0.7 oz)] 175 lb 0.7 oz (79.4 kg) (11/14 2240) Weight change:  Last BM Date: 03/16/11  Intake/Output from previous day: 11/14 0701 - 11/15 0700 In: 3925 [I.V.:3875; IV Piggyback:50] Out: 950 [Urine:200; Blood:750] Total I/O In: -  Out: 150 [Urine:150]   Physical Exam: General: Alert, awake, oriented x2. Heart: Regular rate and rhythm, without murmurs, rubs, gallops. Lungs: Clear to auscultation bilaterally. Abdomen: Soft, nontender, nondistended, positive bowel sounds. Extremities: No  edema with positive pedal pulses. Neuro:alert ,ox 3,moves lower extremities with diffuclty    Lab Results: Results for orders placed during the hospital encounter of 03/16/11 (from the past 24 hour(s))  COMPREHENSIVE METABOLIC PANEL     Status: Abnormal   Collection Time   03/16/11 11:21 PM      Component Value Range   Sodium 136  135 - 145 (mEq/L)   Potassium 4.2  3.5 - 5.1 (mEq/L)   Chloride 97  96 - 112 (mEq/L)   CO2 29  19 - 32 (mEq/L)   Glucose, Bld 109 (*) 70 - 99 (mg/dL)   BUN 17  6 - 23 (mg/dL)   Creatinine, Ser 1.61  0.50 - 1.10 (mg/dL)   Calcium 9.6  8.4 - 09.6 (mg/dL)   Total Protein 6.8  6.0 - 8.3 (g/dL)   Albumin 2.6 (*) 3.5 - 5.2 (g/dL)   AST 40 (*) 0 - 37 (U/L)   ALT 61 (*) 0 - 35 (U/L)   Alkaline Phosphatase 54  39 - 117 (U/L)   Total Bilirubin 0.5  0.3 - 1.2 (mg/dL)   GFR calc non Af Amer 86 (*) >90 (mL/min)   GFR calc Af Amer >90  >90 (mL/min)  CBC     Status: Abnormal   Collection Time   03/16/11 11:21 PM      Component Value Range   WBC 10.8 (*) 4.0 - 10.5  (K/uL)   RBC 3.95  3.87 - 5.11 (MIL/uL)   Hemoglobin 12.5  12.0 - 15.0 (g/dL)   HCT 04.5  40.9 - 81.1 (%)   MCV 93.7  78.0 - 100.0 (fL)   MCH 31.6  26.0 - 34.0 (pg)   MCHC 33.8  30.0 - 36.0 (g/dL)   RDW 91.4  78.2 - 95.6 (%)   Platelets 187  150 - 400 (K/uL)  DIFFERENTIAL     Status: Abnormal   Collection Time   03/16/11 11:21 PM      Component Value Range   Neutrophils Relative 75  43 - 77 (%)   Neutro Abs 8.1 (*) 1.7 - 7.7 (K/uL)   Lymphocytes Relative 9 (*) 12 - 46 (%)   Lymphs Abs 0.9  0.7 - 4.0 (K/uL)   Monocytes Relative 13 (*) 3 - 12 (%)   Monocytes Absolute 1.3 (*) 0.1 - 1.0 (K/uL)   Eosinophils Relative 3  0 - 5 (%)   Eosinophils Absolute 0.4  0.0 - 0.7 (K/uL)   Basophils Relative 0  0 - 1 (%)   Basophils Absolute 0.0  0.0 - 0.1 (K/uL)  PROTIME-INR  Status: Normal   Collection Time   03/16/11 11:21 PM      Component Value Range   Prothrombin Time 15.0  11.6 - 15.2 (seconds)   INR 1.16  0.00 - 1.49   GRAM STAIN     Status: Normal   Collection Time   03/17/11  3:23 AM      Component Value Range   Specimen Description ABSCESS BACK     Special Requests       Value: L2 3 LUMBAR EPIDURAL PATIENT ON FOLLOWING GENTAMYCIN   Gram Stain       Value: RARE WBC PRESENT,BOTH PMN AND MONONUCLEAR     NO ORGANISMS SEEN     CALLED TO E BETHEL, RN 03/17/11 0838 BY K SCHULTZ   Report Status 03/17/2011 FINAL    GRAM STAIN     Status: Normal   Collection Time   03/17/11  4:43 AM      Component Value Range   Specimen Description ABSCESS BACK     Special Requests       Value: LUMBAR THREE AND FOUR EPIDURAL PATIENT ON FOLLOWING GENTAMYCIN   Gram Stain       Value: FEW WBC PRESENT,BOTH PMN AND MONONUCLEAR     NO ORGANISMS SEEN     CALLED TO E BETHEL, RN 03/17/11 0838 BY K SCHULTZ   Report Status 03/17/2011 FINAL    POCT I-STAT 4, (NA,K, GLUC, HGB,HCT)     Status: Abnormal   Collection Time   03/17/11  4:45 AM      Component Value Range   Sodium 137  135 - 145 (mEq/L)    Potassium 4.1  3.5 - 5.1 (mEq/L)   Glucose, Bld 108 (*) 70 - 99 (mg/dL)   HCT 04.5 (*) 40.9 - 46.0 (%)   Hemoglobin 9.5 (*) 12.0 - 15.0 (g/dL)  TYPE AND SCREEN     Status: Normal   Collection Time   03/17/11  5:03 AM      Component Value Range   ABO/RH(D) O POS     Antibody Screen NEG     Sample Expiration 03/20/2011    CBC     Status: Abnormal   Collection Time   03/17/11  5:42 AM      Component Value Range   WBC 10.2  4.0 - 10.5 (K/uL)   RBC 3.12 (*) 3.87 - 5.11 (MIL/uL)   Hemoglobin 9.5 (*) 12.0 - 15.0 (g/dL)   HCT 81.1 (*) 91.4 - 46.0 (%)   MCV 92.3  78.0 - 100.0 (fL)   MCH 30.4  26.0 - 34.0 (pg)   MCHC 33.0  30.0 - 36.0 (g/dL)   RDW 78.2  95.6 - 21.3 (%)   Platelets 181  150 - 400 (K/uL)  PROTIME-INR     Status: Abnormal   Collection Time   03/17/11  5:42 AM      Component Value Range   Prothrombin Time 17.1 (*) 11.6 - 15.2 (seconds)   INR 1.37  0.00 - 1.49   APTT     Status: Abnormal   Collection Time   03/17/11  5:42 AM      Component Value Range   aPTT 43 (*) 24 - 37 (seconds)  CBC     Status: Abnormal   Collection Time   03/17/11  8:42 AM      Component Value Range   WBC 9.3  4.0 - 10.5 (K/uL)   RBC 3.38 (*) 3.87 - 5.11 (MIL/uL)   Hemoglobin  10.5 (*) 12.0 - 15.0 (g/dL)   HCT 40.9 (*) 81.1 - 46.0 (%)   MCV 93.2  78.0 - 100.0 (fL)   MCH 31.1  26.0 - 34.0 (pg)   MCHC 33.3  30.0 - 36.0 (g/dL)   RDW 91.4  78.2 - 95.6 (%)   Platelets 183  150 - 400 (K/uL)  COMPREHENSIVE METABOLIC PANEL     Status: Abnormal   Collection Time   03/17/11  8:42 AM      Component Value Range   Sodium 142  135 - 145 (mEq/L)   Potassium 3.7  3.5 - 5.1 (mEq/L)   Chloride 102  96 - 112 (mEq/L)   CO2 30  19 - 32 (mEq/L)   Glucose, Bld 101 (*) 70 - 99 (mg/dL)   BUN 14  6 - 23 (mg/dL)   Creatinine, Ser 2.13  0.50 - 1.10 (mg/dL)   Calcium 9.8  8.4 - 08.6 (mg/dL)   Total Protein 6.0  6.0 - 8.3 (g/dL)   Albumin 2.3 (*) 3.5 - 5.2 (g/dL)   AST 29  0 - 37 (U/L)   ALT 48 (*) 0 - 35  (U/L)   Alkaline Phosphatase 49  39 - 117 (U/L)   Total Bilirubin 0.4  0.3 - 1.2 (mg/dL)   GFR calc non Af Amer 87 (*) >90 (mL/min)   GFR calc Af Amer >90  >90 (mL/min)  GENTAMICIN LEVEL, TROUGH     Status: Normal   Collection Time   03/17/11  8:42 AM      Component Value Range   Gentamicin Trough 1.3  0.5 - 2.0 (ug/mL)      Studies/Results:  Medications:    . ampicillin (OMNIPEN) IV  1 g Intravenous Q4H  . ceFAZolin (ANCEF) IV  1 g Intravenous Q8H  . docusate sodium  100 mg Oral BID  . gentamicin  60 mg Intravenous Q12H  . HYDROmorphone      . ipratropium  0.5 mg Nebulization Q6H  . nicotine  21 mg Transdermal Daily  . sodium chloride  3 mL Intravenous Q12H  . vancomycin  1,000 mg Intravenous To NeurOR  . DISCONTD: gentamicin  60 mg Intravenous Q8H    acetaminophen, acetaminophen, albuterol, cyclobenzaprine, HYDROmorphone, menthol-cetylpyridinium, naloxone (NARCAN) injection, ondansetron (ZOFRAN) IV, ondansetron (ZOFRAN) IV, ondansetron, phenol, sodium chloride, sodium phosphate, DISCONTD: 50,000 units bacitracin in 0.9% normal saline 250 mL irrigation, DISCONTD: hemostatic agents, DISCONTD: lidocaine-EPINEPHrine, DISCONTD: Surgifoam 1 Gm with Thrombin 5,000 units (5 ml) topical solution DISCONTD: thrombin     . DISCONTD: sodium chloride    . DISCONTD: dextrose 5 % and 0.45% NaCl 75 mL/hr at 03/17/11 0056    Assessment/Plan:  Principal Problem:  *Epidural abscess Active Problems:  Sepsis/enterococcus bactremia  History of TTP (thrombotic thrombocytopenic purpura)  COPD (chronic obstructive pulmonary disease) Plan: S/P lumbar aminectomy/decompression microdiscectomy Doing well  Appreciate Dr Lonie Peak help! Continue pain control and antibiotics  ID consult called yesterday ,pending.   LOS: 1 day   Khara Renaud 03/17/2011, 10:58 AM

## 2011-03-17 NOTE — Progress Notes (Signed)
Pharmacy consult:  Gentamicin   Gentamicin trough reported at 1.3 mg/L.  Dose charted as given at 625 am and Gent trough collected at 0842 am.  If these times are correct, this would be closer to a peak than a trough.  However, the level of 1.3 makes me think that this is actually a trough.  I will repeat the trough tomorrow to verify this result.  03/17/2011 [name]

## 2011-03-17 NOTE — Consult Note (Signed)
Date of Admission:  03/16/2011  Date of Consult:  03/17/2011  Reason for Consult: epidural abscess Referring Physician: Dr. Cleotis Lema  Most of the history is obtained from the medical charts as patient is confused and has fluctuating level of orientation. She is also mildly sedated from narcotic pain medications.   [name] is an 70 y.o. female with pmh of COPD, DJD of back, dementia and depression who comes to [facility] from [facility] yesterday evening. She was transferred here after she was found to have epidural abscess. She presented there on 11/7 with back pain and fever but was not admitted at that time. She came back next day because she was having worsening pain and fever, leg weakness, inability to stand and chills. She was admitted and blood cultures and urine cultures were obtained. She was started on vanc and zosyn. Her symptoms initially got better but she worsening pain after that. Blood Cx at admission grew enterococcus fecalis sensitive to ampicillin so she was switched to amp and gentamicin. She also had a 2 D echo which did not show any vegetations. She had an MRI for worsening back pain which showed epidural abscess with cord compression at multiple level around L1-4. She was transferred here at that time. She had urgent laminectomy and this morning at 6. Purulent material was drained from her lumbar vertebra which has been sent for culture. She has orders for 1 dose of vancomycin and 2 doses of cefalexin from the OR which has not ben administered. Gram stain from the abscess so far show some polys but no organism.   Patient is currently c/o back pain and abdmnial pain which is because she feels like she wants to pass urine.   Past Medical History  Diagnosis Date  . Septicemia 03/2011  . Normal echocardiogram 03/15/11  . History of TTP (thrombotic thrombocytopenic purpura)   . Acute delirium   . Bacteremia 03/2011  . COPD (chronic obstructive pulmonary disease)   .  Blood transfusion   . Anemia   . Arthritis   . Chronic back pain   . Spinal stenosis, lumbar with multiple joint injection but no surgeries or prosthesis   . Blood dyscrasia   . Depression     "mild"    Past Surgical History  Procedure Date  . Cesarean section 10/09/1976  . Tonsillectomy   . Tubal ligation   . Cataract extraction w/ intraocular lens  implant, bilateral ~ 2010  . Eye surgery   ergies:   Allergies  Allergen Reactions  . Other     Wool: Reaction is hives Advertising account executive tape: Reaction is blistering  . Sulfa Antibiotics Hives  . Sulfa Drugs Cross Reactors     Medications:  Gentamicin- day 4 Ampicillin- Day 4  Social History:  reports that she has been smoking.  She has never used smokeless tobacco. She reports that she drinks alcohol. She reports that she does not use illicit drugs. lives with husband at home  History reviewed. No pertinent family history.  ROS Constitutional: Denies fever, chills, diaphoresis, has appetite change and fatigue.  HEENT: Denies photophobia, eye pain, redness, hearing loss, ear pain, congestion, sore throat, rhinorrhea, sneezing, mouth sores, trouble swallowing, neck pain, neck stiffness and tinnitus.   Respiratory: Denies SOB, DOE, cough, chest tightness,  and wheezing.   Cardiovascular: Denies chest pain, palpitations and leg swelling.  Gastrointestinal: Denies nausea, vomiting, has abdominal pain, constipation,  Genitourinary: Denies dysuria, urgency, frequency, hematuria, flank pain and difficulty  urinating.  Musculoskeletal: has , back pain, joint swelling, arthralgias and gait problem.  Skin: Denies pallor, rash and wound.  Neurological: Denies dizziness, seizures, syncope, weakness, light-headedness, numbness and headaches.    Blood pressure 117/69, pulse 103, temperature 97.9 F (36.6 C), temperature source Oral, resp. rate 15, weight 175 lb 0.7 oz (79.4 kg), SpO2 91.00%.  Physical exam BP 117/69   Pulse 103  Temp(Src) 97.9 F (36.6 C) (Oral)  Resp 15  Wt 175 lb 0.7 oz (79.4 kg)  SpO2 91%  General Appearance:    Lying in bed, mild distress from pain, generalized tremor  Throat:   Lips, mucosa, and tongue normal; teeth and gums normal  Neck:   Supple, symmetrical, trachea midline, no adenopathy;    thyroid:  no enlargement/tenderness/nodules; no carotid   bruit or JVD  Back:     Symmetric, no curvature, ROM normal, no CVA tenderness  Lungs:     Clear to auscultation bilaterally, respirations unlabored  Chest Wall:    No tenderness or deformity   Heart:    Regular rate and rhythm, S1 and S2 normal, faint sys murmur at left sternal border, rub   or gallop  Abdomen:     Soft, non-tender, bowel sounds active all four quadrants,    no masses, no organomegaly  Extremities:   Extremities normal, atraumatic, no cyanosis or edema. Ne hemorrhages.   Pulses:   2+ and symmetric all extremities  Skin:   Skin color, texture, turgor normal, no rashes or lesions  Lymph nodes:   Cervical, supraclavicular, and axillary nodes normal  Neurologic:   CNII-XII intact, generalized reduced strength more in the lower extremity.normal sensation.       Results for orders placed during the hospital encounter of 03/16/11 (from the past 48 hour(s))  COMPREHENSIVE METABOLIC PANEL     Status: Abnormal   Collection Time   03/16/11 11:21 PM      Component Value Range Comment   Sodium 136  135 - 145 (mEq/L)    Potassium 4.2  3.5 - 5.1 (mEq/L)    Chloride 97  96 - 112 (mEq/L)    CO2 29  19 - 32 (mEq/L)    Glucose, Bld 109 (*) 70 - 99 (mg/dL)    BUN 17  6 - 23 (mg/dL)    Creatinine, Ser 1.61  0.50 - 1.10 (mg/dL)    Calcium 9.6  8.4 - 10.5 (mg/dL)    Total Protein 6.8  6.0 - 8.3 (g/dL)    Albumin 2.6 (*) 3.5 - 5.2 (g/dL)    AST 40 (*) 0 - 37 (U/L)    ALT 61 (*) 0 - 35 (U/L)    Alkaline Phosphatase 54  39 - 117 (U/L)    Total Bilirubin 0.5  0.3 - 1.2 (mg/dL)    GFR calc non Af Amer 86 (*) >90 (mL/min)     GFR calc Af Amer >90  >90 (mL/min)   CBC     Status: Abnormal   Collection Time   03/16/11 11:21 PM      Component Value Range Comment   WBC 10.8 (*) 4.0 - 10.5 (K/uL)    RBC 3.95  3.87 - 5.11 (MIL/uL)    Hemoglobin 12.5  12.0 - 15.0 (g/dL)    HCT 09.6  04.5 - 40.9 (%)    MCV 93.7  78.0 - 100.0 (fL)    MCH 31.6  26.0 - 34.0 (pg)    MCHC 33.8  30.0 - 36.0 (g/dL)  RDW 13.4  11.5 - 15.5 (%)    Platelets 187  150 - 400 (K/uL)   DIFFERENTIAL     Status: Abnormal   Collection Time   03/16/11 11:21 PM      Component Value Range Comment   Neutrophils Relative 75  43 - 77 (%)    Neutro Abs 8.1 (*) 1.7 - 7.7 (K/uL)    Lymphocytes Relative 9 (*) 12 - 46 (%)    Lymphs Abs 0.9  0.7 - 4.0 (K/uL)    Monocytes Relative 13 (*) 3 - 12 (%)    Monocytes Absolute 1.3 (*) 0.1 - 1.0 (K/uL)    Eosinophils Relative 3  0 - 5 (%)    Eosinophils Absolute 0.4  0.0 - 0.7 (K/uL)    Basophils Relative 0  0 - 1 (%)    Basophils Absolute 0.0  0.0 - 0.1 (K/uL)   PROTIME-INR     Status: Normal   Collection Time   03/16/11 11:21 PM      Component Value Range Comment   Prothrombin Time 15.0  11.6 - 15.2 (seconds)    INR 1.16  0.00 - 1.49    GRAM STAIN     Status: Normal   Collection Time   03/17/11  3:23 AM      Component Value Range Comment   Specimen Description ABSCESS BACK      Special Requests        Value: L2 3 LUMBAR EPIDURAL PATIENT ON FOLLOWING GENTAMYCIN   Gram Stain        Value: RARE WBC PRESENT,BOTH PMN AND MONONUCLEAR     NO ORGANISMS SEEN     CALLED TO E BETHEL, RN 03/17/11 0838 BY K SCHULTZ   Report Status 03/17/2011 FINAL     GRAM STAIN     Status: Normal   Collection Time   03/17/11  4:43 AM      Component Value Range Comment   Specimen Description ABSCESS BACK      Special Requests        Value: LUMBAR THREE AND FOUR EPIDURAL PATIENT ON FOLLOWING GENTAMYCIN   Gram Stain        Value: FEW WBC PRESENT,BOTH PMN AND MONONUCLEAR     NO ORGANISMS SEEN     CALLED TO E BETHEL, RN  03/17/11 0838 BY K SCHULTZ   Report Status 03/17/2011 FINAL     POCT I-STAT 4, (NA,K, GLUC, HGB,HCT)     Status: Abnormal   Collection Time   03/17/11  4:45 AM      Component Value Range Comment   Sodium 137  135 - 145 (mEq/L)    Potassium 4.1  3.5 - 5.1 (mEq/L)    Glucose, Bld 108 (*) 70 - 99 (mg/dL)    HCT 16.1 (*) 09.6 - 46.0 (%)    Hemoglobin 9.5 (*) 12.0 - 15.0 (g/dL)   TYPE AND SCREEN     Status: Normal   Collection Time   03/17/11  5:03 AM      Component Value Range Comment   ABO/RH(D) O POS      Antibody Screen NEG      Sample Expiration 03/20/2011     CBC     Status: Abnormal   Collection Time   03/17/11  5:42 AM      Component Value Range Comment   WBC 10.2  4.0 - 10.5 (K/uL)    RBC 3.12 (*) 3.87 - 5.11 (MIL/uL)    Hemoglobin 9.5 (*)  12.0 - 15.0 (g/dL)    HCT 45.4 (*) 09.8 - 46.0 (%)    MCV 92.3  78.0 - 100.0 (fL)    MCH 30.4  26.0 - 34.0 (pg)    MCHC 33.0  30.0 - 36.0 (g/dL)    RDW 11.9  14.7 - 82.9 (%)    Platelets 181  150 - 400 (K/uL)   PROTIME-INR     Status: Abnormal   Collection Time   03/17/11  5:42 AM      Component Value Range Comment   Prothrombin Time 17.1 (*) 11.6 - 15.2 (seconds)    INR 1.37  0.00 - 1.49    APTT     Status: Abnormal   Collection Time   03/17/11  5:42 AM      Component Value Range Comment   aPTT 43 (*) 24 - 37 (seconds)   CBC     Status: Abnormal   Collection Time   03/17/11  8:42 AM      Component Value Range Comment   WBC 9.3  4.0 - 10.5 (K/uL)    RBC 3.38 (*) 3.87 - 5.11 (MIL/uL)    Hemoglobin 10.5 (*) 12.0 - 15.0 (g/dL)    HCT 56.2 (*) 13.0 - 46.0 (%)    MCV 93.2  78.0 - 100.0 (fL)    MCH 31.1  26.0 - 34.0 (pg)    MCHC 33.3  30.0 - 36.0 (g/dL)    RDW 86.5  78.4 - 69.6 (%)    Platelets 183  150 - 400 (K/uL)   COMPREHENSIVE METABOLIC PANEL     Status: Abnormal   Collection Time   03/17/11  8:42 AM      Component Value Range Comment   Sodium 142  135 - 145 (mEq/L)    Potassium 3.7  3.5 - 5.1 (mEq/L)    Chloride 102   96 - 112 (mEq/L)    CO2 30  19 - 32 (mEq/L)    Glucose, Bld 101 (*) 70 - 99 (mg/dL)    BUN 14  6 - 23 (mg/dL)    Creatinine, Ser 2.95  0.50 - 1.10 (mg/dL)    Calcium 9.8  8.4 - 10.5 (mg/dL)    Total Protein 6.0  6.0 - 8.3 (g/dL)    Albumin 2.3 (*) 3.5 - 5.2 (g/dL)    AST 29  0 - 37 (U/L)    ALT 48 (*) 0 - 35 (U/L)    Alkaline Phosphatase 49  39 - 117 (U/L)    Total Bilirubin 0.4  0.3 - 1.2 (mg/dL)    GFR calc non Af Amer 87 (*) >90 (mL/min)    GFR calc Af Amer >90  >90 (mL/min)   GENTAMICIN LEVEL, TROUGH     Status: Normal   Collection Time   03/17/11  8:42 AM      Component Value Range Comment   Gentamicin Trough 1.3  0.5 - 2.0 (ug/mL)       Component Value Date/Time   SDES ABSCESS BACK 03/17/2011 0443   SPECREQUEST LUMBAR THREE AND FOUR EPIDURAL PATIENT ON FOLLOWING GENTAMYCIN 03/17/2011 0443   REPTSTATUS 03/17/2011 FINAL 03/17/2011 0443   Dg Lumbar Spine 1 View  03/17/2011  *RADIOLOGY REPORT*  Clinical Data: Instrument placement.  Intraoperative localization.  LUMBAR SPINE - 1 VIEW  Comparison: Intraoperative films from the same day.  Findings: This film is labeled 04:30 a.m.  The vertebral body levels are labeled.  The T12 compression fracture is again noted. Surgical probes  are directed at the L2-3 and L4-5 disc spaces.  A sponge is in place posteriorly.  IMPRESSION:  1.  Intra operative localization of L2-3 and L4-5.  Original Report Authenticated By: Jamesetta Orleans. MATTERN, M.D.   Dg Lumbar Spine 1 View  03/17/2011  *RADIOLOGY REPORT*  Clinical Data: Preop.  Localization.  LUMBAR SPINE - 1 VIEW  Comparison: Single view lumbar spine from the same day at 03:00 a.m.  Findings: This film is labeled 03:15 a.m.  A surgical probe is directed to the L1-2 disc space.  Alignment is stable.  IMPRESSION: Intraoperative localization of the L1-2 disc space.  Original Report Authenticated By: Jamesetta Orleans. MATTERN, M.D.   Dg Lumbar Spine 1 View  03/17/2011  *RADIOLOGY REPORT*  Clinical  Data: Preoperative radiograph; needle placement for epidural abscess surgery.  LUMBAR SPINE - 1 VIEW  Comparison: None.  Findings: Multilevel disc space narrowing is noted along the lower lumbar spine, with associated sclerotic endplate change.  Vacuum phenomenon is noted at L2-L3.  There is a chronic compression deformity at vertebral body T12.  Bilateral facet disease is noted at the lower lumbar spine.  Scattered vascular calcifications are seen.  A needle is noted projecting along the superior aspect of the L2 spinous process.  IMPRESSION: Preoperative radiograph of the lumbar spine as described above; degenerative change noted along the lumbar spine.  Original Report Authenticated By: Tonia Ghent, M.D.   Dg Abd Portable 1v  03/17/2011  *RADIOLOGY REPORT*  Clinical Data: Incorrect sponge count during lumbar spinal surgery; evaluate for foreign body.  ABDOMEN - 1 VIEW  Comparison: Lumbar spine radiographs performed during surgery, the most recent of which was at 04:30 a.m.  Findings: No radiopaque foreign bodies are seen to suggest retained sponge.  Evaluation is mildly suboptimal due to limitations in penetration.  Degenerative change is noted along the lumbar spine; right convex lumbar scoliosis is noted.  Mild sclerotic change is seen at the sacroiliac joints.  The visualized bowel gas pattern is grossly unremarkable.  IMPRESSION:  1.  No radiopaque foreign bodies seen to suggest a retained sponge. 2.  Degenerative change noted along the lumbar spine, with right convex lumbar scoliosis.  Original Report Authenticated By: Tonia Ghent, M.D.   2D echo (11/13- no vegetation, EF >55%, mild MR and TR.   Impression/Recommendation  70 y/o woman with djd of back presents with bactermia from enterococcus and epidural abscess which was drained today. She also had a uti which grew less than 100000 colonies of enterococcus.   - Enterococcus epidural abscess/osteomyelitis: she will need 6 week course of iv  antibiotics for epidural abscess/osteomylitis with bacteremia with ampicillin and gentamicin. Enterococcus epidural infections is uncommon and Its unclear if the ostemylitis is from urinary tract. She has new murmur on exam which is concerning for endocarditis. If she has endocarditis, she will need 6 weeks of gentamicin as well. She does have a faint murmur but 2D echo from [place] did not show vegetation, but did show mild TR and MR. We recommend that she undergoes TEE for evaluation of any valvular vegetations  - Epidural abscess- s/p laminectomy and drainage. Cultures pending. Continue ampiclin and gentamicin.   Thank you so much for this consult,   [facility] 03/17/2011, 10:47 AM    I have seen patient with, discussed plan,  and reviewed the above note by Dr. Scot Dock. I agree with the above assessment.  Judyann Munson Infectious Diseases [phone] 03/17/2011, 10:16 PM     -

## 2011-03-17 NOTE — Progress Notes (Signed)
Speech Language/Pathology Clinical/Bedside Swallow Evaluation    Clinical Impression: Limited swallow eval secondary to dietary restrictions after surgery this morning.  However, pt with much improved lucidity this morning; no oral/pharyngeal symptoms of dysphagia and excellent toleration of large, successive thin liquid boluses.    No dysphagia.   Recommendations Solid Consistency:  advance to regular when cleared by MD Liquid Consistency: Thin Liquid Administration via: Straw;Cup Medication Administration: Whole meds with liquid Supervision: Patient able to self feed   No SLP f/u warranted.  Will sign-off.  [name], [place] CCC/SLP Pager [phone]      [name] 03/17/2011,12:03 PM

## 2011-03-17 NOTE — Anesthesia Preprocedure Evaluation (Addendum)
Anesthesia Evaluation  Patient identified by MRN, date of birth, ID band Patient awake    Reviewed: Allergy & Precautions, H&P , NPO status , Patient's Chart, lab work & pertinent test results  History of Anesthesia Complications Negative for: history of anesthetic complications  Airway Mallampati: II TM Distance: >3 FB Neck ROM: Full    Dental  (+) Caps   Pulmonary COPD COPD inhaler, Current Smoker,  + rhonchi        Cardiovascular Exercise Tolerance: Poor neg cardio ROS Regular Normal    Neuro/Psych    GI/Hepatic negative GI ROS, Neg liver ROS,   Endo/Other  Negative Endocrine ROS  Renal/GU negative Renal ROS  Genitourinary negative   Musculoskeletal   Abdominal (+) obese,   Peds  Hematology  (+) Blood dyscrasia, ,   Anesthesia Other Findings 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   Dental advisory given  Plan Discussed with: CRNA and Surgeon  Anesthesia Plan Comments:         Anesthesia Quick Evaluation

## 2011-03-17 NOTE — Transfer of Care (Signed)
Immediate Anesthesia Transfer of Care Note  Patient: [name]  Procedure(s) Performed:  LUMBAR LAMINECTOMY/DECOMPRESSION MICRODISCECTOMY  Patient Location: PACU and Nursing Unit  Anesthesia Type: General  Level of Consciousness: awake and alert   Airway & Oxygen Therapy: Patient Spontanous Breathing and Patient connected to nasal cannula oxygen  Post-op Assessment: Post -op Vital signs reviewed and stable, Patient moving all extremities and report to unit 2600 RN   Post vital signs: Reviewed and stable  Complications: No apparent anesthesia complications

## 2011-03-17 NOTE — Progress Notes (Signed)
Utilization review complete 

## 2011-03-17 NOTE — Anesthesia Postprocedure Evaluation (Signed)
  Anesthesia Post-op Note  Patient: [name]  Procedure(s) Performed:  LUMBAR LAMINECTOMY/DECOMPRESSION MICRODISCECTOMY  Patient Location: PACU  Anesthesia Type: General  Level of Consciousness: awake  Airway and Oxygen Therapy: Patient Spontanous Breathing  Post-op Pain: mild  Post-op Assessment: Post-op Vital signs reviewed  Post-op Vital Signs: stable  Complications: No apparent anesthesia complications

## 2011-03-17 NOTE — Op Note (Signed)
Diagnosis: Lumbar epidural abscess L1-L5  Postoperative diagnosis: Same procedure  Procedure: Decompressive lumbar laminectomy L1-2, L2-3, L3-4, with microscopic dissection of the left L2-3 and IV nerve roots and microscopic evacuation of the ventral lumbar epidural abscess.  Surgeon: Dr. Jillyn Hidden Siris Hoos  Anesthesia: Gen.  History of present illness: The patient is a 69 year old female who was admitted yesterday to the hospitalist service on transfer from [facility]. She was admitted for evaluation of fevers chills back pain and leg pain and weakness an MRI scan that showed lumbar epidural abscess use. Clinical exam was consistent with proximal leg weakness MRI was consistent with a large pockets of abscess behind the L2 and L3 vertebral bodies with severe lumbar spinal stenosis from L1 down to L5. Patient and family were recommended decompression at L1-2 23, 34, 45 for evacuation of lumbar epidural abscess. Risks and benefits, alternatives to surgery, perioperative course, and expectations of outcome were all explained to the patient and her family. They all understood and agreed to proceed forward.  Operative procedure: Patient was brought into the OR was induced under general anesthesia and positioned prone on the Wilson frame. Her back was prepped and draped in routine sterile fashion. After infiltration 10 cc lidocaine with epi and preoperative x-ray to localize the appropriate level, a midline incision made over the electrocautery was used to take down the subcutaneous tissues and subperiosteal dissection was carried on the lamina of L2-3 and 4 bilaterally. Spinous processes at L2 and L3 and part of the spinous process of L4 and inferior aspect of L1 were removed. Then central decompression was carried out favoring the left side maintaining intact lamina on the right side. This is carried out from just inferior to the L1-2 disc space extending down to just superior to the L4-5 disc space.  Under riding of the gutters to help decompress the central canal which was markedly stenotic from severe ligamentous hypertrophy and facet arthropathy. There was a large osteophyte coming off the medial facet of the facet complex to superior the 45 disc space this was teased away with a 4 Penfield and removed in  piecemeal fashion. At this point the operating microscope was draped and brought into the field under microscopic illumination just underneath the L3 nerve root I nerve hook was used to reach underneath the thecal sac and this immediately freed up a pocket of purulent material which was sent for culture. In addition tracking up superiorly underneath the L2 root and a pocket of fluid was freed up and and aspirated and also sent for culture. There was extensive phlegmon granulation tissue on the ventral the results easily with I nerve hook room and removed with pituitary rongeurs. At the decompression and fraying of the axes the thecal sac was much less tense and the foramina were widely decompressed at these levels. There was a fair amount of oozing from all her tissues but meticulous in a stasis space was ultimately maintained with a combination of bipolar electrocautery Bovie electrocautery Gelfoam and Surgifoam. After adequate meticulous hemostasis had been achieved, the wound is copiously irrigated a large Hemovac drain was placed and the wounds closed in layers with interrupted Vicryl and a running 4-0 subcuticular. Patient then was wound was dressed and patient was went to recovery room in stable condition. Initial counts did reveal one sponge was not accounted for. Extensive re\re cutting was carried out in addition to an x-ray was read by both myself and the radiologist and no sponge was visualized in the  wound. Patient was then released to recovery.

## 2011-03-18 LAB — BASIC METABOLIC PANEL
BUN: 13 mg/dL (ref 6–23)
Calcium: 9.3 mg/dL (ref 8.4–10.5)
Chloride: 102 mEq/L (ref 96–112)
Creatinine, Ser: 0.72 mg/dL (ref 0.50–1.10)
GFR calc Af Amer: >90 mL/min (ref >90)

## 2011-03-18 LAB — CBC
HCT: 29 % — ABNORMAL LOW (ref 36.0–46.0)
MCH: 31.1 pg (ref 26.0–34.0)
MCHC: 33.4 g/dL (ref 30.0–36.0)
MCV: 92.9 fL (ref 78.0–100.0)
RDW: 13.3 % (ref 11.5–15.5)

## 2011-03-18 LAB — GLUCOSE, CAPILLARY: Glucose-Capillary: 107 mg/dL — ABNORMAL HIGH (ref 70–99)

## 2011-03-18 MED ORDER — IPRATROPIUM BROMIDE 0.02 % IN SOLN: 0.5000 mg | Freq: Four times a day (QID) | RESPIRATORY_TRACT | Status: DC | PRN

## 2011-03-18 NOTE — Progress Notes (Signed)
ANTIBIOTIC CONSULT NOTE - INITIAL  Pharmacy Consult for Gentanicin Indication: abscess  Allergies  Allergen Reactions  . Other     Wool: Reaction is hives Advertising account executive tape: Reaction is blistering  . Sulfa Antibiotics Hives  . Sulfa Drugs Cross Reactors     Patient Measurements: Height: 5\' 6"  (167.6 cm) Weight: 177 lb 11.1 oz (80.6 kg) IBW/kg (Calculated) : 59.3    Vital Signs: Temp: 98.1 F (36.7 C) (11/16 0814) Temp src: Oral (11/16 0814) BP: 139/62 mmHg (11/16 0400) Pulse Rate: 98  (11/16 0400) Intake/Output from previous day: 11/15 0701 - 11/16 0700 In: 2130 [P.O.:1560; I.V.:160; IV Piggyback:410] Out: 2230 [Urine:2050; Drains:180] Intake/Output from this shift: Total I/O In: -  Out: 350 [Urine:350]  Labs:SrCr 0.8 from [place]   Microbiology: Recent Results (from the past 720 hour(s))  GRAM STAIN     Status: Normal   Collection Time   03/17/11  3:23 AM      Component Value Range Status Comment   Specimen Description ABSCESS BACK   Final    Special Requests     Final    Value: L2 3 LUMBAR EPIDURAL PATIENT ON FOLLOWING GENTAMYCIN   Gram Stain     Final    Value: RARE WBC PRESENT,BOTH PMN AND MONONUCLEAR     NO ORGANISMS SEEN     CALLED TO E BETHEL, RN 03/17/11 0838 BY K SCHULTZ   Report Status 03/17/2011 FINAL   Final   CULTURE, ROUTINE-ABSCESS (INCLUDES ANAEROBIC)     Status: Normal (Preliminary result)   Collection Time   03/17/11  3:23 AM      Component Value Range Status Comment   Specimen Description ABSCESS BACK   Final    Special Requests     Final    Value: L2 3 LUMBAR EPIDURAL PATIENT ON FOLLOWING GENTAMYCIN   Gram Stain PENDING   Incomplete    Culture NO GROWTH 1 DAY   Final    Report Status PENDING   Incomplete   ANAEROBIC CULTURE     Status: Normal (Preliminary result)   Collection Time   03/17/11  3:23 AM      Component Value Range Status Comment   Specimen Description ABSCESS BACK   Final    Special Requests     Final     Value: L2 3 LUMBAR EPIDURAL PATIENT ON FOLLOWING GENTAMYCIN   Gram Stain     Final    Value: RARE WBC PRESENT,BOTH PMN AND MONONUCLEAR     NO ORGANISMS SEEN     CALLED TO E BETHEL  RN 03/17/11 0838 BY K SCHULTZ Performed at [facility]   Culture PENDING   Incomplete    Report Status PENDING   Incomplete   GRAM STAIN     Status: Normal   Collection Time   03/17/11  4:43 AM      Component Value Range Status Comment   Specimen Description ABSCESS BACK   Final    Special Requests     Final    Value: LUMBAR THREE AND FOUR EPIDURAL PATIENT ON FOLLOWING GENTAMYCIN   Gram Stain     Final    Value: FEW WBC PRESENT,BOTH PMN AND MONONUCLEAR     NO ORGANISMS SEEN     CALLED TO E BETHEL, RN 03/17/11 0838 BY K SCHULTZ   Report Status 03/17/2011 FINAL   Final   CULTURE, ROUTINE-ABSCESS (INCLUDES ANAEROBIC)     Status: Normal (Preliminary result)   Collection Time  03/17/11  4:43 AM      Component Value Range Status Comment   Specimen Description ABSCESS BACK   Final    Special Requests     Final    Value: LUMBAR THREE AND FOUR EPIDURAL PATIENT ON FOLLOWING GENTAMYCIN   Gram Stain PENDING   Incomplete    Culture NO GROWTH 1 DAY   Final    Report Status PENDING   Incomplete   ANAEROBIC CULTURE     Status: Normal (Preliminary result)   Collection Time   03/17/11  4:43 AM      Component Value Range Status Comment   Specimen Description ABSCESS BACK   Final    Special Requests     Final    Value: LUMBAR THREE AND FOUR EPIDURAL PATIENT ON FOLLOWING GENTAMYCIN   Gram Stain     Final    Value: FEW WBC PRESENT,BOTH PMN AND MONONUCLEAR     NO ORGANISMS SEEN     CALLED Arvella Merles BETHAL RN 03/17/11 0838 BY K SCHULTZ Performed at [facility]   Culture PENDING   Incomplete    Report Status PENDING   Incomplete     Medical History: Past Medical History  Diagnosis Date  . Septicemia 03/2011  . Normal echocardiogram 03/15/11  . History of TTP (thrombotic thrombocytopenic purpura)   .  Acute delirium   . Bacteremia 03/2011  . COPD (chronic obstructive pulmonary disease)   . Blood transfusion   . Anemia   . Arthritis   . Chronic back pain   . Spinal stenosis, lumbar   . Blood dyscrasia   . Depression     "mild"    Medications:  Prescriptions prior to admission  Medication Sig Dispense Refill  . albuterol (PROVENTIL HFA;VENTOLIN HFA) 108 (90 BASE) MCG/ACT inhaler Inhale 2 puffs into the lungs every 6 (six) hours as needed. For shortness of breath       . albuterol (PROVENTIL) (2.5 MG/3ML) 0.083% nebulizer solution Take 2.5 mg by nebulization every 6 (six) hours as needed. For wheezing       . ALPRAZolam (XANAX) 0.25 MG tablet Take 0.25 mg by mouth 3 (three) times daily as needed. For pain and anxiety       . cyclobenzaprine (FLEXERIL) 10 MG tablet Take 10 mg by mouth 3 (three) times daily as needed. For pain/muscle spasms       . FLUoxetine (PROZAC) 20 MG capsule Take 20 mg by mouth daily.        Marland Kitchen gabapentin (NEURONTIN) 800 MG tablet Take 800 mg by mouth 4 (four) times daily.        . meloxicam (MOBIC) 7.5 MG tablet Take 7.5 mg by mouth daily.        Marland Kitchen oxyCODONE-acetaminophen (PERCOCET) 7.5-325 MG per tablet Take 1 tablet by mouth every 4 (four) hours as needed. For pain       . rosuvastatin (CRESTOR) 20 MG tablet Take 20 mg by mouth daily.        Marland Kitchen tiotropium (SPIRIVA) 18 MCG inhalation capsule Place 18 mcg into inhaler and inhale daily.         Assessment: 70 yo transferred from [facility] for epidural abscess.  Currently on ampicillin for reported enterococcus.  Gent to be added for synergy.  Trough level today is 0.7 mg/L which is on target (goal 0.5-1).   Goal of Therapy:  Gentamicin trough < 1  Plan:  Continue Gentamicin 60mg  IV q12.   Monitor renal function.  Weekly Gentamicin trough.  Mickeal Skinner 03/18/2011,9:25 AM

## 2011-03-18 NOTE — Progress Notes (Signed)
Subjective: Patient seen and examined ,feeling much better ,minimal pain at the surgical site.  Objective: Vital signs in last 24 hours: Temp:  [98.1 F (36.7 C)-101.5 F (38.6 C)] 98.1 F (36.7 C) (11/16 0814) Pulse Rate:  [90-127] 98  (11/16 0400) Resp:  [14-22] 17  (11/16 0400) BP: (106-154)/(49-82) 139/62 mmHg (11/16 0400) SpO2:  [92 %-100 %] 98 % (11/16 0400) Weight:  [76.6 kg (168 lb 14 oz)-80.6 kg (177 lb 11.1 oz)] 177 lb 11.1 oz (80.6 kg) (11/16 0028) Weight change: 0 kg (0 lb) Last BM Date:  (pt inable to verbalize last bm)  Intake/Output from previous day: 11/15 0701 - 11/16 0700 In: 2130 [P.O.:1560; I.V.:160; IV Piggyback:410] Out: 2230 [Urine:2050; Drains:180] Total I/O In: 240 [P.O.:240] Out: 1050 [Urine:1050]   Physical Exam: General: Alert, awake, oriented x3.  Heart: Regular rate and rhythm, without murmurs, rubs, gallops.  Lungs: Clear to auscultation bilaterally.  Abdomen: Soft, nontender, nondistended, positive bowel sounds.  Extremities: No edema with positive pedal pulses.  Neuro:alert ,ox 3,moves lower extremities without  diffuclty     Lab Results: Results for orders placed during the hospital encounter of 03/16/11 (from the past 24 hour(s))  GLUCOSE, CAPILLARY     Status: Normal   Collection Time   03/17/11  5:03 PM      Component Value Range   Glucose-Capillary 90  70 - 99 (mg/dL)   Comment 1 Notify RN    GLUCOSE, CAPILLARY     Status: Abnormal   Collection Time   03/17/11  9:27 PM      Component Value Range   Glucose-Capillary 138 (*) 70 - 99 (mg/dL)   Comment 1 Documented in Chart     Comment 2 Notify RN    CBC     Status: Abnormal   Collection Time   03/18/11  4:40 AM      Component Value Range   WBC 12.0 (*) 4.0 - 10.5 (K/uL)   RBC 3.12 (*) 3.87 - 5.11 (MIL/uL)   Hemoglobin 9.7 (*) 12.0 - 15.0 (g/dL)   HCT 16.1 (*) 09.6 - 46.0 (%)   MCV 92.9  78.0 - 100.0 (fL)   MCH 31.1  26.0 - 34.0 (pg)   MCHC 33.4  30.0 - 36.0 (g/dL)   RDW  04.5  40.9 - 81.1 (%)   Platelets 216  150 - 400 (K/uL)  BASIC METABOLIC PANEL     Status: Abnormal   Collection Time   03/18/11  4:40 AM      Component Value Range   Sodium 140  135 - 145 (mEq/L)   Potassium 4.1  3.5 - 5.1 (mEq/L)   Chloride 102  96 - 112 (mEq/L)   CO2 32  19 - 32 (mEq/L)   Glucose, Bld 111 (*) 70 - 99 (mg/dL)   BUN 13  6 - 23 (mg/dL)   Creatinine, Ser 9.14  0.50 - 1.10 (mg/dL)   Calcium 9.3  8.4 - 78.2 (mg/dL)   GFR calc non Af Amer 86 (*) >90 (mL/min)   GFR calc Af Amer >90  >90 (mL/min)  GENTAMICIN LEVEL, TROUGH     Status: Normal   Collection Time   03/18/11  4:40 AM      Component Value Range   Gentamicin Trough 0.7  0.5 - 2.0 (ug/mL)  GLUCOSE, CAPILLARY     Status: Abnormal   Collection Time   03/18/11  8:32 AM      Component Value Range   Glucose-Capillary 107 (*)  70 - 99 (mg/dL)   Comment 1 Notify RN    MRSA PCR SCREENING     Status: Normal   Collection Time   03/18/11  9:41 AM      Component Value Range   MRSA by PCR NEGATIVE  NEGATIVE     Studies/Results:  Medications:    . ampicillin (OMNIPEN) IV  1 g Intravenous Q4H  . ceFAZolin (ANCEF) IV  1 g Intravenous Q8H  . docusate sodium  100 mg Oral BID  . FLUoxetine  20 mg Oral Daily  . gabapentin  400 mg Oral TID  . gentamicin  60 mg Intravenous Q12H  . ipratropium  0.5 mg Nebulization Q6H  . nicotine  21 mg Transdermal Daily  . rosuvastatin  20 mg Oral q1800  . sodium chloride  3 mL Intravenous Q12H  . vancomycin  1,000 mg Intravenous To NeurOR    acetaminophen, acetaminophen, albuterol, ALPRAZolam, cyclobenzaprine, HYDROmorphone, menthol-cetylpyridinium, naloxone (NARCAN) injection, ondansetron (ZOFRAN) IV, ondansetron (ZOFRAN) IV, ondansetron, phenol, sodium chloride, sodium phosphate  Assessment/Plan: Principal Problem:  *Epidural abscess  Active Problems:  Sepsis/enterococcus bactremia  History of TTP (thrombotic thrombocytopenic purpura)  COPD (chronic obstructive pulmonary  disease)  Plan:  S/P lumbar aminectomy/decompression microdiscectomy  Doing well  Continue pain control and antibiotics as per ID  Will consult cardiology for TEE Will transfer to the floor if ok by neurosurgery.    LOS: 2 days   [name] 03/18/2011, 11:55 AM

## 2011-03-18 NOTE — Progress Notes (Signed)
Subjective: Patient reports no problems voiding, significant improvement in her back and leg pain. She was ambulating today with physical therapy and pain was well-controlled.  Objective: Vital signs in last 24 hours: Temp:  [98.1 F (36.7 C)-101.5 F (38.6 C)] 98.6 F (37 C) (11/16 1231) Pulse Rate:  [90-127] 106  (11/16 0700) Resp:  [16-22] 20  (11/16 0700) BP: (106-154)/(53-82) 148/71 mmHg (11/16 0700) SpO2:  [92 %-98 %] 96 % (11/16 0700) Weight:  [76.6 kg (168 lb 14 oz)-80.6 kg (177 lb 11.1 oz)] 177 lb 11.1 oz (80.6 kg) (11/16 0028)  Intake/Output from previous day: 11/15 0701 - 11/16 0700 In: 2130 [P.O.:1560; I.V.:160; IV Piggyback:410] Out: 2230 [Urine:2050; Drains:180] Intake/Output this shift: Total I/O In: 480 [P.O.:480] Out: 1550 [Urine:1550]  Strength appears to be 5 out of 5 in her iliopsoas quads hamstrings gastrocs and EHL. the wound appears to be clean and dry. Her Hemovac was discontinued.  Lab Results:  [facility] 03/18/11 0440 03/17/11 0842  WBC 12.0* 9.3  HGB 9.7* 10.5*  HCT 29.0* 31.5*  PLT 216 183   BMET  Basename 03/18/11 0440 03/17/11 0842  NA 140 142  K 4.1 3.7  CL 102 102  CO2 32 30  GLUCOSE 111* 101*  BUN 13 14  CREATININE 0.72 0.69  CALCIUM 9.3 9.8    Studies/Results: Dg Lumbar Spine 1 View  03/17/2011  *RADIOLOGY REPORT*  Clinical Data: Instrument placement.  Intraoperative localization.  LUMBAR SPINE - 1 VIEW  Comparison: Intraoperative films from the same day.  Findings: This film is labeled 04:30 a.m.  The vertebral body levels are labeled.  The T12 compression fracture is again noted. Surgical probes are directed at the L2-3 and L4-5 disc spaces.  A sponge is in place posteriorly.  IMPRESSION:  1.  Intra operative localization of L2-3 and L4-5.  Original Report Authenticated By: Jamesetta Orleans. MATTERN, M.D.   Dg Lumbar Spine 1 View  03/17/2011  *RADIOLOGY REPORT*  Clinical Data: Preop.  Localization.  LUMBAR SPINE - 1 VIEW   Comparison: Single view lumbar spine from the same day at 03:00 a.m.  Findings: This film is labeled 03:15 a.m.  A surgical probe is directed to the L1-2 disc space.  Alignment is stable.  IMPRESSION: Intraoperative localization of the L1-2 disc space.  Original Report Authenticated By: Jamesetta Orleans. MATTERN, M.D.   Dg Lumbar Spine 1 View  03/17/2011  *RADIOLOGY REPORT*  Clinical Data: Preoperative radiograph; needle placement for epidural abscess surgery.  LUMBAR SPINE - 1 VIEW  Comparison: None.  Findings: Multilevel disc space narrowing is noted along the lower lumbar spine, with associated sclerotic endplate change.  Vacuum phenomenon is noted at L2-L3.  There is a chronic compression deformity at vertebral body T12.  Bilateral facet disease is noted at the lower lumbar spine.  Scattered vascular calcifications are seen.  A needle is noted projecting along the superior aspect of the L2 spinous process.  IMPRESSION: Preoperative radiograph of the lumbar spine as described above; degenerative change noted along the lumbar spine.  Original Report Authenticated By: Tonia Ghent, M.D.   Dg Abd Portable 1v  03/17/2011  *RADIOLOGY REPORT*  Clinical Data: Incorrect sponge count during lumbar spinal surgery; evaluate for foreign body.  ABDOMEN - 1 VIEW  Comparison: Lumbar spine radiographs performed during surgery, the most recent of which was at 04:30 a.m.  Findings: No radiopaque foreign bodies are seen to suggest retained sponge.  Evaluation is mildly suboptimal due to limitations in penetration.  Degenerative change  is noted along the lumbar spine; right convex lumbar scoliosis is noted.  Mild sclerotic change is seen at the sacroiliac joints.  The visualized bowel gas pattern is grossly unremarkable.  IMPRESSION:  1.  No radiopaque foreign bodies seen to suggest a retained sponge. 2.  Degenerative change noted along the lumbar spine, with right convex lumbar scoliosis.  Original Report Authenticated By:  Tonia Ghent, M.D.    Assessment/Plan: Patient appears to be recovering well from her multilevel decompression for epidural abscess. Significant improvement in back and leg pain. Recommend continued mobilization with physical and occupational therapy. I discontinued her Hemovac drain. It's okay with me for her to be transferred to the floor and multiple discharged in stable from medical perspective. She will need several weeks of IV antibiotics to treat her dural abscess I do not seen any results of new results of her cultures of her lumbar wound.   LOS: 2 days     Latima Hamza P 03/18/2011, 4:37 PM

## 2011-03-18 NOTE — Progress Notes (Signed)
70 yo F with enterococcal bacteremia and epidural abscess pod #1 laminectomy and IX D  Subjective: Doing better, no fevers, still "foggy" in thinking process especially after having pain medication.  Abtx: ampicillin 1gm Q4hr, gent 60mg  Q12 Day#2  Objective: Vital signs in last 24 hours: Temp:  [98.1 F (36.7 C)-98.8 F (37.1 C)] 98.4 F (36.9 C) (11/16 1640) Pulse Rate:  [90-127] 106  (11/16 0700) Resp:  [16-20] 20  (11/16 0700) BP: (106-148)/(53-71) 148/71 mmHg (11/16 0700) SpO2:  [92 %-98 %] 96 % (11/16 0700) Weight:  [76.6 kg (168 lb 14 oz)-80.6 kg (177 lb 11.1 oz)] 177 lb 11.1 oz (80.6 kg) (11/16 0028) Weight change: 0 kg (0 lb) Last BM Date:  (pt inable to verbalize last bm)    General Appearance:  Lying in bed, mild distress from pain, generalized tremor   Throat:  Lips, mucosa, and tongue normal; teeth and gums normal   Neck:  Supple, symmetrical, trachea midline, no adenopathy;  thyroid: no enlargement/tenderness/nodules; no carotid  bruit or JVD   Back:  Symmetric, no curvature, ROM normal, no CVA tenderness   Lungs:  Clear to auscultation bilaterally, respirations unlabored   Chest Wall:  No tenderness or deformity   Heart:  Regular rate and rhythm, S1 and S2 normal, faint sys murmur at left sternal border, rub  or gallop   Abdomen:  Soft, non-tender, bowel sounds active all four quadrants,  no masses, no organomegaly   Extremities:  Extremities normal, atraumatic, no cyanosis or edema. Ne hemorrhages.   Pulses:  2+ and symmetric all extremities   Skin:  Skin color, texture, turgor normal, no rashes or lesions   Lymph nodes:  Cervical, supraclavicular, and axillary nodes normal   Neurologic:  CNII-XII intact, generalized reduced strength more in the lower extremity.normal sensation.      Lab Results:  [facility] 03/18/11 0440 03/17/11 0842  WBC 12.0* 9.3  HGB 9.7* 10.5*  HCT 29.0* 31.5*  PLT 216 183   BMET  Basename 03/18/11 0440 03/17/11 0842  NA 140  142  K 4.1 3.7  CL 102 102  CO2 32 30  GLUCOSE 111* 101*  BUN 13 14  CREATININE 0.72 0.69  CALCIUM 9.3 9.8   MICRO: 11/15 lumbar NGTD 1/15 MRSA PCR screen : negative  Studies/Results: Dg Lumbar Spine 1 View  03/17/2011  *RADIOLOGY REPORT*  Clinical Data: Instrument placement.  Intraoperative localization.  LUMBAR SPINE - 1 VIEW  Comparison: Intraoperative films from the same day.  Findings: This film is labeled 04:30 a.m.  The vertebral body levels are labeled.  The T12 compression fracture is again noted. Surgical probes are directed at the L2-3 and L4-5 disc spaces.  A sponge is in place posteriorly.  IMPRESSION:  1.  Intra operative localization of L2-3 and L4-5.  Original Report Authenticated By: Jamesetta Orleans. MATTERN, M.D.   Dg Lumbar Spine 1 View  03/17/2011  *RADIOLOGY REPORT*  Clinical Data: Preop.  Localization.  LUMBAR SPINE - 1 VIEW  Comparison: Single view lumbar spine from the same day at 03:00 a.m.  Findings: This film is labeled 03:15 a.m.  A surgical probe is directed to the L1-2 disc space.  Alignment is stable.  IMPRESSION: Intraoperative localization of the L1-2 disc space.  Original Report Authenticated By: Jamesetta Orleans. MATTERN, M.D.   Dg Lumbar Spine 1 View  03/17/2011  *RADIOLOGY REPORT*  Clinical Data: Preoperative radiograph; needle placement for epidural abscess surgery.  LUMBAR SPINE - 1 VIEW  Comparison: None.  Findings: Multilevel disc space narrowing is noted along the lower lumbar spine, with associated sclerotic endplate change.  Vacuum phenomenon is noted at L2-L3.  There is a chronic compression deformity at vertebral body T12.  Bilateral facet disease is noted at the lower lumbar spine.  Scattered vascular calcifications are seen.  A needle is noted projecting along the superior aspect of the L2 spinous process.  IMPRESSION: Preoperative radiograph of the lumbar spine as described above; degenerative change noted along the lumbar spine.  Original Report  Authenticated By: Tonia Ghent, M.D.   Dg Abd Portable 1v  03/17/2011  *RADIOLOGY REPORT*  Clinical Data: Incorrect sponge count during lumbar spinal surgery; evaluate for foreign body.  ABDOMEN - 1 VIEW  Comparison: Lumbar spine radiographs performed during surgery, the most recent of which was at 04:30 a.m.  Findings: No radiopaque foreign bodies are seen to suggest retained sponge.  Evaluation is mildly suboptimal due to limitations in penetration.  Degenerative change is noted along the lumbar spine; right convex lumbar scoliosis is noted.  Mild sclerotic change is seen at the sacroiliac joints.  The visualized bowel gas pattern is grossly unremarkable.  IMPRESSION:  1.  No radiopaque foreign bodies seen to suggest a retained sponge. 2.  Degenerative change noted along the lumbar spine, with right convex lumbar scoliosis.  Original Report Authenticated By: Tonia Ghent, M.D.    Assessment/Plan: enterococcal bacteremia c/b epidural abscess 1) continue with ampicillin and gentamicin at current doses. Will treat for at least 6 wks with IV antibiotics. 2) please get TEE to evaluate for endocarditis   LOS: 2 days   Judyann Munson 03/18/2011, 5:20 PM

## 2011-03-18 NOTE — Progress Notes (Signed)
Physical Therapy Evaluation Patient Details Name: [name] MRN: [id number] DOB: 05/22/1940 Today's Date: 03/18/2011  Problem List:  Patient Active Problem List  Diagnoses  . Epidural abscess  . Sepsis  . History of TTP (thrombotic thrombocytopenic purpura)  . COPD (chronic obstructive pulmonary disease)    Past Medical History:  Past Medical History  Diagnosis Date  . Septicemia 03/2011  . Normal echocardiogram 03/15/11  . History of TTP (thrombotic thrombocytopenic purpura)   . Acute delirium   . Bacteremia 03/2011  . COPD (chronic obstructive pulmonary disease)   . Blood transfusion   . Anemia   . Arthritis   . Chronic back pain   . Spinal stenosis, lumbar   . Blood dyscrasia   . Depression     "mild"   Past Surgical History:  Past Surgical History  Procedure Date  . Cesarean section 10/09/1976  . Tonsillectomy   . Tubal ligation   . Cataract extraction w/ intraocular lens  implant, bilateral ~ 2010  . Eye surgery     PT Assessment/Plan/Recommendation PT Assessment Clinical Impression Statement: pt is a 70 year old female s/p multilevel lumbar lami with epidural abscess drainage.  Mobility limited by pain decr activity tolerance and strength.  Back education initiated.  Advised  back prec., lifting prec., practiced bed mob. and discussed progression of activity.  Pt can benefit from HHPT PT Recommendation/Assessment: Patient will need skilled PT in the acute care venue PT Problem List: Decreased strength;Decreased activity tolerance;Decreased mobility;Decreased balance;Decreased coordination;Pain;Decreased knowledge of precautions Barriers to Discharge: Other (comment) (stairs) PT Therapy Diagnosis : Difficulty walking;Generalized weakness;Acute pain PT Plan PT Frequency: Min 5X/week PT Treatment/Interventions: DME instruction;Gait training;Stair training;Functional mobility training;Patient/family education;Balance training PT  Recommendation Recommendations for Other Services: OT consult Follow Up Recommendations: Home health PT PT Goals  Acute Rehab PT Goals PT Goal Formulation: With patient/family Time For Goal Achievement: 5 days Pt will Roll Supine to Right Side: with supervision PT Goal: Rolling Supine to Right Side - Progress: Other (comment) Pt will Roll Supine to Left Side: with supervision PT Goal: Rolling Supine to Left Side - Progress: Other (comment) Pt will go Supine/Side to Sit: with supervision PT Goal: Supine/Side to Sit - Progress: Other (comment) Pt will Transfer Sit to Stand/Stand to Sit: with supervision PT Transfer Goal: Sit to Stand/Stand to Sit - Progress: Other (comment) Pt will Transfer Bed to Chair/Chair to Bed: with supervision PT Transfer Goal: Bed to Chair/Chair to Bed - Progress: Other (comment) Pt will Ambulate: >150 feet;with supervision;with rolling walker PT Goal: Ambulate - Progress: Other (comment)  PT Evaluation Precautions/Restrictions  Precautions Precautions: Back;Other (comment) (advised) Precaution Comments: instructed pt and dtr in mobility and lifting precautions Required Braces or Orthoses: No Prior Functioning  Home Living Lives With: Family Receives Help From: Family Type of Home: House Home Layout: Two level;Full bath on main level;Able to live on main level with bedroom/bathroom Alternate Level Stairs-Rails: Left;Right Alternate Level Stairs-Number of Steps: 12 Home Access: Stairs to enter;Ramped entrance Entrance Stairs-Number of Steps: 3 Bathroom Shower/Tub: Tub/shower unit;Curtain Bathroom Toilet: Standard Home Adaptive Equipment: Bedside commode/3-in-1;Grab bars in shower;Wheelchair - manual;Walker - rolling;Other (comment) (riser and tub seat) Prior Function Level of Independence: Independent with gait;Independent with basic ADLs;Other (comment) (recently started using cane and decreased ADL's due to pain) Able to Take Stairs?: Yes Driving:  Yes Cognition Cognition Arousal/Alertness: Awake/alert Sensation/Coordination Coordination Gross Motor Movements are Fluid and Coordinated: No Coordination and Movement Description: stiff and labored movement Extremity Assessment RLE Assessment RLE  Assessment: Within Functional Limits LLE Assessment LLE Assessment: Within Functional Limits (Bil le's are generally weak at approx. 3+/5) Mobility (including Balance) Bed Mobility Bed Mobility: Yes Rolling Left: 4: Min assist Left Sidelying to Sit: 3: Mod assist;HOB flat Left Sidelying to Sit Details (indicate cue type and reason): tc/vc's for technique and hand placement Sitting - Scoot to Edge of Bed: 4: Min assist Transfers Transfers: Yes Sit to Stand: 3: Mod assist;Other (comment) (pt=70%) Sit to Stand Details (indicate cue type and reason): vc's for hand placement Stand to Sit: 4: Min assist Stand to Sit Details: vc for reaching back, squat with head forward for controlled descent Ambulation/Gait Ambulation/Gait: Yes Ambulation/Gait Assistance: 3: Mod assist;Other (comment) (+1 for lines/tubes) Ambulation/Gait Assistance Details (indicate cue type and reason): manual A for walker maneuvering; vc;s for postural checks Ambulation Distance (Feet): 24 Feet Assistive device: Rolling walker Gait Pattern: Decreased dorsiflexion - right;Within Functional Limits (weak-kneed gait with buckling L knee x1)    Exercise    End of Session PT - End of Session Activity Tolerance: Patient tolerated treatment well Patient left: in chair;with call bell in reach;with family/visitor present Nurse Communication: Mobility status for ambulation General Behavior During Session: [facility] for tasks performed  Leeann Bady, Eliseo Gum 03/18/2011, 12:59 PM  03/18/2011  Poydras Bing, PT [phone] (pager)

## 2011-03-19 DIAGNOSIS — G062 Extradural and subdural abscess, unspecified: Secondary | ICD-10-CM

## 2011-03-19 LAB — BASIC METABOLIC PANEL
CO2: 31 mEq/L (ref 19–32)
Calcium: 9.3 mg/dL (ref 8.4–10.5)
Creatinine, Ser: 0.67 mg/dL (ref 0.50–1.10)
Glucose, Bld: 108 mg/dL — ABNORMAL HIGH (ref 70–99)

## 2011-03-19 LAB — GLUCOSE, CAPILLARY
Glucose-Capillary: 114 mg/dL — ABNORMAL HIGH (ref 70–99)
Glucose-Capillary: 111 mg/dL — ABNORMAL HIGH (ref 70–99)
Glucose-Capillary: 98 mg/dL (ref 70–99)

## 2011-03-19 MED ORDER — BIOTENE DRY MOUTH MT LIQD
15.0000 mL | Freq: Two times a day (BID) | OROMUCOSAL | Status: DC
Start: 2011-03-19 — End: 2011-03-23
  Administered 2011-03-19 – 2011-03-23 (×4): 15 mL via OROMUCOSAL

## 2011-03-19 NOTE — Progress Notes (Signed)
Report called and given to [facility], RN who will be receiving pt to Room 3008.

## 2011-03-19 NOTE — Plan of Care (Signed)
Problem: Phase I Progression Outcomes Goal: OOB as tolerated unless otherwise ordered Outcome: Progressing Recommend 24 hour assistance on D/C with home health PT f/u.

## 2011-03-19 NOTE — Progress Notes (Signed)
Pt transferred via bed to Room 3008.  Pt left in room with her husband with no s/s of distress.  Pt's belongings including her eye glasses taken with her.  Front desk made aware of pt's arrival and NT in Room 3008 with pt.

## 2011-03-19 NOTE — Progress Notes (Signed)
Physical Therapy Treatment Patient Details Name: [name] MRN: [id number] DOB: 13-Aug-1940 Today's Date: 03/19/2011  PT Assessment/Plan  PT - Assessment/Plan Comments on Treatment Session: Patient is s/p lumbar laminectomy with significant pain limiting patient's ability to function independently.  Needs continued PT to address mobility limitations, endurance for activity and balance/postural issues.  Will definitely need 24 hour care on D/C with HHPT f/u. PT Plan: Discharge plan remains appropriate;Frequency remains appropriate Follow Up Recommendations: Home health PT Equipment Recommended: None recommended by PT PT Goals  Acute Rehab PT Goals PT Goal: Rolling Supine to Right Side - Progress: Progressing toward goal PT Goal: Rolling Supine to Left Side - Progress: Progressing toward goal PT Goal: Supine/Side to Sit - Progress: Progressing toward goal PT Transfer Goal: Sit to Stand/Stand to Sit - Progress: Progressing toward goal PT Transfer Goal: Bed to Chair/Chair to Bed - Progress: Progressing toward goal PT Goal: Ambulate - Progress: Progressing toward goal  PT Treatment Precautions/Restrictions  Precautions Precautions: Back Precaution Booklet Issued: No Precaution Comments: Reviewed back precautions and sitting restrictions with pt. Required Braces or Orthoses: No Mobility (including Balance) Bed Mobility Rolling Left: 4: Min assist (cues for log roll, verbal and tactile) Left Sidelying to Sit: 4: Min assist;HOB flat Left Sidelying to Sit Details (indicate cue type and reason): verbal and tactile cues for technique and hand placement Sitting - Scoot to Edge of Bed: 4: Min assist Sitting - Scoot to [place] of Bed Details (indicate cue type and reason): cues for technique Transfers Sit to Stand: 3: Mod assist;From elevated surface;From bed;With upper extremity assist Sit to Stand Details (indicate cue type and reason): Verbal cues for hand placement as pt. wants to pull  up on RW.  Pt. had difficulty using her LEs to assist with sit to stand with over use of UEs Stand to Sit: 3: Mod assist;To chair/3-in-1;With armrests;With upper extremity assist Stand to Sit Details: Vc for hand placement and for controlled descent. Ambulation/Gait Ambulation/Gait Assistance: 3: Mod assist Ambulation/Gait Assistance Details (indicate cue type and reason): As soon as pt. stood, she began to urinate on floor.  Obtained potty chair and pt. ambulated 2 steps to 3 n 1.  Cleaned pt. with total assist and changed gown and socks.  Pt. shaky and stated she was cold and that her tailbone hurts when she sits on the 3n1.  She took 3 steps to chair and could go no further.  Pt. with bil knee instability with buckling needing mod assist to steady pt.Also needed manual assist to move RW.   Ambulation Distance (Feet): 6 Feet Assistive device: Rolling walker Gait Pattern: Decreased hip/knee flexion - left;Decreased hip/knee flexion - right;Shuffle;Trunk flexed;Decreased dorsiflexion - right (Continues with buckling L knee greater than R knee) Stairs: No Wheelchair Mobility Wheelchair Mobility: No  Posture/Postural Control Posture/Postural Control: Postural limitations Postural Limitations: Flexed posture and poor postural stability overall. Balance Balance Assessed: No Exercise    End of Session PT - End of Session Equipment Utilized During Treatment: Gait belt Activity Tolerance: Patient limited by pain;Patient limited by fatigue Patient left: in chair;with call bell in reach Nurse Communication: Mobility status for ambulation General Behavior During Session: [facility] for tasks performed Cognition: [facility] for tasks performed  INGOLD,Kita Neace 03/19/2011, 9:32 AM [facility] Acute Rehabilitation [phone] (pager)

## 2011-03-19 NOTE — Progress Notes (Signed)
Spoke to Dr. Cleotis Lema in regards to pt requesting clotrimazole 10 mg lozenges be ordered for her 4 times daily.  According to pt she takes the medicine at home to "prevent thrush."  Dr. Cleotis Lema stated "let pt know I'll review meds and look into ordering it."

## 2011-03-19 NOTE — Progress Notes (Signed)
70 yo F with enterococcal bacteremia and epidural abscess pod #1 laminectomy and IX D  Subjective:  Doing better, no fevers, still "foggy" in thinking process especially after having pain medication.  Abtx: ampicillin 1gm Q4hr, gent 60mg  Q12 Day#2  Objective: BP 141/92  Pulse 107  Temp(Src) 99 F (37.2 C) (Oral)  Resp 22  Ht 5\' 6"  (1.676 m)  Wt 180 lb 8.9 oz (81.9 kg)  BMI 29.14 kg/m2  SpO2 96% General Appearance:  Lying in bed, mild distress from pain, generalized tremor  Throat:  Lips, mucosa, and tongue normal; teeth and gums norm Back:  Symmetric, no curvature, ROM normal, no CVA tenderness  Lungs:  Clear to auscultation bilaterally, respirations unlabored  Heart: Regular rate and rhythm, S1 and S2 normal, faint sys murmur at left sternal border, rub  or gallop  Abdomen: Soft, non-tender, bowel sounds active all four quadrants,  no masses, no organomegaly  Extremities:  Extremities normal, atraumatic, no cyanosis or edema. Ne hemorrhages.  Skin:  Skin color, texture, turgor normal, no rashes or lesions   Lab Results  Component Value Date   WBC 12.0* 03/18/2011   HGB 9.7* 03/18/2011   HCT 29.0* 03/18/2011   MCV 92.9 03/18/2011   PLT 216 03/18/2011   Blood Culture    Component Value Date/Time   SDES ABSCESS BACK 03/17/2011 0443   SDES ABSCESS BACK 03/17/2011 0443   SDES ABSCESS BACK 03/17/2011 0443   SDES ABSCESS BACK 03/17/2011 0443   SPECREQUEST LUMBAR THREE AND FOUR EPIDURAL PATIENT ON FOLLOWING GENTAMYCIN 03/17/2011 0443   SPECREQUEST LUMBAR THREE AND FOUR EPIDURAL PATIENT ON FOLLOWING GENTAMYCIN 03/17/2011 0443   SPECREQUEST LUMBAR THREE AND FOUR EPIDURAL PATIENT ON FOLLOWING GENTAMYCIN 03/17/2011 0443   SPECREQUEST LUMBAR THREE AND FOUR EPIDURAL PATIENT ON FOLLOWING GENTAMYCIN 03/17/2011 0443   CULT CULTURE IN PROGRESS FOR FOUR WEEKS 03/17/2011 0443   CULT NO GROWTH 2 DAYS 03/17/2011 0443   CULT NO ANAEROBES ISOLATED; CULTURE IN PROGRESS FOR 5 DAYS  03/17/2011 0443   REPTSTATUS 03/17/2011 FINAL 03/17/2011 0443   REPTSTATUS PENDING 03/17/2011 0443   REPTSTATUS PENDING 03/17/2011 0443   REPTSTATUS PENDING 03/17/2011 0443     Assessment/Plan: enterococcal bacteremia c/b epidural abscess  1) continue with ampicillin and gentamicin at current doses. Will treat for at least 6 wks with IV antibiotics.  2) please get TEE to evaluate for endocarditis

## 2011-03-19 NOTE — Progress Notes (Signed)
Subjective: Patient seen and examined ,denies any complaints.  Objective: Vital signs in last 24 hours: Temp:  [98.4 F (36.9 C)-100.3 F (37.9 C)] 100.3 F (37.9 C) (11/17 1320) Pulse Rate:  [98-110] 107  (11/17 0912) Resp:  [18-25] 22  (11/17 0912) BP: (128-145)/(57-92) 145/67 mmHg (11/17 1320) SpO2:  [88 %-98 %] 96 % (11/17 0912) Weight:  [81.9 kg (180 lb 8.9 oz)] 180 lb 8.9 oz (81.9 kg) (11/17 0400) Weight change: 5.3 kg (11 lb 11 oz) Last BM Date:  (pt inable to verbalize last bm)  Intake/Output from previous day: 11/16 0701 - 11/17 0700 In: 1350 [P.O.:1000; IV Piggyback:350] Out: 2530 [Urine:2500; Drains:30] Total I/O In: 290 [P.O.:240; IV Piggyback:50] Out: 500 [Urine:500]   Physical Exam: General: Alert, awake, oriented x3.  Heart: Regular rate and rhythm, without murmurs, rubs, gallops.  Lungs: Clear to auscultation bilaterally.  Abdomen: Soft, nontender, nondistended, positive bowel sounds.  Extremities: No edema with positive pedal pulses.  Neuro:alert ,ox 3,moves all  extremities      Lab Results: Results for orders placed during the hospital encounter of 03/16/11 (from the past 24 hour(s))  GLUCOSE, CAPILLARY     Status: Abnormal   Collection Time   03/18/11  5:24 PM      Component Value Range   Glucose-Capillary 105 (*) 70 - 99 (mg/dL)   Comment 1 Notify RN    BASIC METABOLIC PANEL     Status: Abnormal   Collection Time   03/19/11  7:55 AM      Component Value Range   Sodium 138  135 - 145 (mEq/L)   Potassium 3.9  3.5 - 5.1 (mEq/L)   Chloride 97  96 - 112 (mEq/L)   CO2 31  19 - 32 (mEq/L)   Glucose, Bld 108 (*) 70 - 99 (mg/dL)   BUN 16  6 - 23 (mg/dL)   Creatinine, Ser 5.28  0.50 - 1.10 (mg/dL)   Calcium 9.3  8.4 - 41.3 (mg/dL)   GFR calc non Af Amer 88 (*) >90 (mL/min)   GFR calc Af Amer >90  >90 (mL/min)  GLUCOSE, CAPILLARY     Status: Abnormal   Collection Time   03/19/11  8:30 AM      Component Value Range   Glucose-Capillary 114 (*) 70  - 99 (mg/dL)   Comment 1 Documented in Chart     Comment 2 Notify RN    GLUCOSE, CAPILLARY     Status: Abnormal   Collection Time   03/19/11  1:18 PM      Component Value Range   Glucose-Capillary 111 (*) 70 - 99 (mg/dL)   Comment 1 Documented in Chart     Comment 2 Notify RN      Studies/Results: No results found.  Medications:    . ampicillin (OMNIPEN) IV  1 g Intravenous Q4H  . antiseptic oral rinse  15 mL Mouth Rinse BID  . docusate sodium  100 mg Oral BID  . FLUoxetine  20 mg Oral Daily  . gabapentin  400 mg Oral TID  . gentamicin  60 mg Intravenous Q12H  . nicotine  21 mg Transdermal Daily  . rosuvastatin  20 mg Oral q1800  . sodium chloride  3 mL Intravenous Q12H    acetaminophen, acetaminophen, albuterol, ALPRAZolam, cyclobenzaprine, HYDROmorphone, ipratropium, menthol-cetylpyridinium, naloxone (NARCAN) injection, ondansetron (ZOFRAN) IV, ondansetron (ZOFRAN) IV, ondansetron, phenol, sodium chloride, sodium phosphate     Assessment/Plan:  Principal Problem:  *Epidural abscess  Active Problems:  Sepsis/enterococcus  bactremia  History of TTP (thrombotic thrombocytopenic purpura)  COPD (chronic obstructive pulmonary disease)  Plan:  S/P lumbar aminectomy/decompression microdiscectomy  Continue antibiotics as per ID for at least 6 weeks,will order piccline on Monday. cardiology (Dr Bennetta Laos for TEE ,he will kindly do it on Monday ,will keep her NPO Sunday night. Will transfer to the medical  floor today Continue PT     LOS: 3 days   [name] 03/19/2011, 1:38 PM

## 2011-03-20 LAB — GLUCOSE, CAPILLARY
Glucose-Capillary: 115 mg/dL — ABNORMAL HIGH (ref 70–99)
Glucose-Capillary: 91 mg/dL (ref 70–99)
Glucose-Capillary: 94 mg/dL (ref 70–99)
Glucose-Capillary: 116 mg/dL — ABNORMAL HIGH (ref 70–99)

## 2011-03-20 LAB — CULTURE, ROUTINE-ABSCESS: Culture: NO GROWTH

## 2011-03-20 MED ORDER — WHITE PETROLATUM GEL
Status: AC
  Administered 2011-03-20: 10:00:00
  Filled 2011-03-20: qty 5

## 2011-03-20 MED ORDER — CLOTRIMAZOLE 10 MG MT TROC
10.0000 mg | Freq: Three times a day (TID) | OROMUCOSAL | Status: DC
  Filled 2011-03-20 (×4): qty 1

## 2011-03-20 NOTE — Progress Notes (Signed)
Subjective: This is seen and examined, complaining of mild left hip pain with walking.  Objective: Vital signs in last 24 hours: Temp:  [98.4 F (36.9 C)-100.6 F (38.1 C)] 99.3 F (37.4 C) (11/18 1007) Pulse Rate:  [85-99] 85  (11/18 1007) Resp:  [18] 18  (11/18 1007) BP: (99-145)/(57-71) 109/57 mmHg (11/18 1007) SpO2:  [88 %-96 %] 94 % (11/18 1007) Weight:  [79.2 kg (174 lb 9.7 oz)] 174 lb 9.7 oz (79.2 kg) (11/17 1711) Weight change: -2.7 kg (-5 lb 15.2 oz) Last BM Date: 03/18/11  Intake/Output from previous day: 11/17 0701 - 11/18 0700 In: 833 [P.O.:780; I.V.:3; IV Piggyback:50] Out: 1650 [Urine:1650]     Physical Exam: General: Alert, awake, oriented x3.  Heart: Regular rate and rhythm, without murmurs, rubs, gallops.  Lungs: Clear to auscultation bilaterally.  Abdomen: Soft, nontender, nondistended, positive bowel sounds.  Extremities: No edema with positive pedal pulses.  Neuro:alert ,ox 3,moves all extremities . Left hip range of motion is full.     Lab Results: Results for orders placed during the hospital encounter of 03/16/11 (from the past 24 hour(s))  GLUCOSE, CAPILLARY     Status: Abnormal   Collection Time   03/19/11  1:18 PM      Component Value Range   Glucose-Capillary 111 (*) 70 - 99 (mg/dL)   Comment 1 Documented in Chart     Comment 2 Notify RN    GLUCOSE, CAPILLARY     Status: Abnormal   Collection Time   03/19/11  4:01 PM      Component Value Range   Glucose-Capillary 180 (*) 70 - 99 (mg/dL)   Comment 1 Documented in Chart     Comment 2 Notify RN    GLUCOSE, CAPILLARY     Status: Normal   Collection Time   03/19/11 10:40 PM      Component Value Range   Glucose-Capillary 98  70 - 99 (mg/dL)  GLUCOSE, CAPILLARY     Status: Abnormal   Collection Time   03/20/11  6:19 AM      Component Value Range   Glucose-Capillary 115 (*) 70 - 99 (mg/dL)  GLUCOSE, CAPILLARY     Status: Normal   Collection Time   03/20/11 11:51 AM      Component  Value Range   Glucose-Capillary 91  70 - 99 (mg/dL)    Studies/Results: No results found.  Medications:    . ampicillin (OMNIPEN) IV  1 g Intravenous Q4H  . antiseptic oral rinse  15 mL Mouth Rinse BID  . clotrimazole  10 mg Oral TID  . docusate sodium  100 mg Oral BID  . FLUoxetine  20 mg Oral Daily  . gabapentin  400 mg Oral TID  . gentamicin  60 mg Intravenous Q12H  . nicotine  21 mg Transdermal Daily  . rosuvastatin  20 mg Oral q1800  . sodium chloride  3 mL Intravenous Q12H  . white petrolatum        acetaminophen, acetaminophen, albuterol, ALPRAZolam, cyclobenzaprine, HYDROmorphone, ipratropium, menthol-cetylpyridinium, naloxone (NARCAN) injection, ondansetron (ZOFRAN) IV, ondansetron (ZOFRAN) IV, ondansetron, phenol, sodium chloride, sodium phosphate     Assessment/Plan:  Principal Problem:  *Epidural abscess  Active Problems:  Sepsis/enterococcus bactremia  History of TTP (thrombotic thrombocytopenic purpura)  COPD (chronic obstructive pulmonary disease)  Plan:  S/P lumbar aminectomy/decompression microdiscectomy  Continue antibiotics as per ID for at least 6 weeks,will order piccline in the AM.  We'll order TEE in the a.m. Dr. Consuello Closs to  read ,will keep her NPO after midnight Pain control Continue PT     LOS: 4 days   [name] 03/20/2011, 12:33 PM

## 2011-03-20 NOTE — Progress Notes (Signed)
Physical Therapy Treatment Patient Details Name: [name] MRN: [id number] DOB: Oct 26, 1940 Today's Date: 03/20/2011  PT Assessment/Plan  PT - Assessment/Plan Comments on Treatment Session: Pt progressing with PT goals.  Very pleasant & willing to participate in therapy today.   PT Plan: Discharge plan remains appropriate PT Frequency: Min 5X/week Follow Up Recommendations: Home health PT PT Goals  Acute Rehab PT Goals PT Goal: Rolling Supine to Right Side - Progress: Progressing toward goal PT Goal: Supine/Side to Sit - Progress: Progressing toward goal PT Transfer Goal: Sit to Stand/Stand to Sit - Progress: Progressing toward goal PT Goal: Ambulate - Progress: Progressing toward goal  PT Treatment Precautions/Restrictions  Precautions Precautions: Back Precaution Booklet Issued: No Precaution Comments: Pt able to recall 2/3 back precautions.  Required min cueing to verballize "no arching".  Reviewed all 3 precautions.  Required Braces or Orthoses: No Restrictions Weight Bearing Restrictions: No Mobility (including Balance) Bed Mobility Rolling Left: 5: Supervision Rolling Left Details (indicate cue type and reason): cues for sequencing & precautions.  no physical asisstance needed.   Left Sidelying to Sit: 4: Min assist Left Sidelying to Sit Details (indicate cue type and reason): facilitation at trunk & pelvis to bring shoulders/trunk to sitting upright.  Cues for technique & placement of UE's to increase ease of transition.   Transfers Sit to Stand: 4: Min assist;Without upper extremity assist;From bed;From chair/3-in-1;Other (comment);With armrests (min guard for sit>stand from chair with armrests) Sit to Stand Details (indicate cue type and reason): cues for safe hand placement/technique/back precautions.  Required decreased assistance to stand from chair with armrests.   Stand to Sit: Other (comment) (min guard) Stand to Sit Details: cues for safe technique &  decrease trunk flexion Ambulation/Gait Ambulation/Gait Assistance: Other (comment) (min guard) Ambulation/Gait Assistance Details (indicate cue type and reason): pt ambulated 20' x 2.  Cues for use of UE's on RW to provide support due to pt c/o of legs feeling weak and wanting to give way.  Ambulation Distance (Feet): 20 Feet (x 2 ) Assistive device: Rolling walker Gait Pattern: Step-through pattern;Decreased step length - right;Decreased step length - left;Decreased stride length;Decreased hip/knee flexion - left;Decreased hip/knee flexion - right Stairs: No Wheelchair Mobility Wheelchair Mobility: No    Exercise    End of Session PT - End of Session Equipment Utilized During Treatment: Gait belt Activity Tolerance: Patient tolerated treatment well Patient left: in chair General Behavior During Session: [facility] for tasks performed Cognition: [facility] for tasks performed  Lara Mulch 03/20/2011, 11:34 AM [phone]

## 2011-03-21 ENCOUNTER — Encounter (HOSPITAL_COMMUNITY): Payer: Self-pay

## 2011-03-21 ENCOUNTER — Encounter (HOSPITAL_COMMUNITY)
Admission: AD | Disposition: A | Discharge status: Skilled Nursing Facility | Payer: Self-pay | Source: Ambulatory Visit | Attending: Internal Medicine

## 2011-03-21 HISTORY — PX: TEE WITHOUT CARDIOVERSION: SHX5443

## 2011-03-21 LAB — BASIC METABOLIC PANEL
Chloride: 100 mEq/L (ref 96–112)
Creatinine, Ser: 0.77 mg/dL (ref 0.50–1.10)
GFR calc Af Amer: >90 mL/min (ref >90)
Potassium: 4 mEq/L (ref 3.5–5.1)
Sodium: 140 mEq/L (ref 135–145)

## 2011-03-21 LAB — CBC
MCV: 92.3 fL (ref 78.0–100.0)
Platelets: 280 10*3/uL (ref 150–400)
RBC: 3.11 MIL/uL — ABNORMAL LOW (ref 3.87–5.11)
RDW: 13.3 % (ref 11.5–15.5)
WBC: 11.2 10*3/uL — ABNORMAL HIGH (ref 4.0–10.5)

## 2011-03-21 LAB — GLUCOSE, CAPILLARY
Glucose-Capillary: 98 mg/dL (ref 70–99)
Glucose-Capillary: 113 mg/dL — ABNORMAL HIGH (ref 70–99)

## 2011-03-21 SURGERY — ECHOCARDIOGRAM, TRANSESOPHAGEAL
Anesthesia: Moderate Sedation

## 2011-03-21 MED ORDER — BENZOCAINE 20 % MT SOLN: 1.0000 "application " | OROMUCOSAL | Status: DC | PRN

## 2011-03-21 MED ORDER — MIDAZOLAM HCL 10 MG/2ML IJ SOLN
INTRAMUSCULAR | Status: DC | PRN
  Administered 2011-03-21: 2 mg via INTRAVENOUS

## 2011-03-21 MED ORDER — BUTAMBEN-TETRACAINE-BENZOCAINE 2-2-14 % EX AERO
INHALATION_SPRAY | CUTANEOUS | Status: DC | PRN
  Administered 2011-03-21: 2 via TOPICAL

## 2011-03-21 MED ORDER — FENTANYL CITRATE 0.05 MG/ML IJ SOLN
INTRAMUSCULAR | Status: AC
  Filled 2011-03-21: qty 2

## 2011-03-21 MED ORDER — MIDAZOLAM HCL 10 MG/2ML IJ SOLN
10.0000 mg | Freq: Once | INTRAMUSCULAR | Status: DC
Start: 2011-03-21 — End: 2011-03-23

## 2011-03-21 MED ORDER — FENTANYL CITRATE 0.05 MG/ML IJ SOLN: 250.0000 ug | Freq: Once | INTRAMUSCULAR | Status: DC

## 2011-03-21 MED ORDER — MIDAZOLAM HCL 10 MG/2ML IJ SOLN
INTRAMUSCULAR | Status: AC
  Filled 2011-03-21: qty 2

## 2011-03-21 MED ORDER — FENTANYL CITRATE 0.05 MG/ML IJ SOLN
INTRAMUSCULAR | Status: DC | PRN
  Administered 2011-03-21: 25 ug via INTRAVENOUS

## 2011-03-21 NOTE — Brief Op Note (Signed)
03/16/2011 - 03/21/2011  5:00 PM  PATIENT:  [name]  70 y.o. female  PRE-OPERATIVE DIAGNOSIS:  Sepsis and endocarditis  POST-OPERATIVE DIAGNOSIS:  ASD, mitral valve vegetation  PROCEDURE:  Procedure(s): TRANSESOPHAGEAL ECHOCARDIOGRAM (TEE) MV vegetation measuring 1cmx0.3 cm noted on the anterior MV leaflet. Mild MR. Secundum ASD small noted. Mild atheresclerotic changes of arch of aorta.

## 2011-03-21 NOTE — H&P (View-Only) (Signed)
Subjective: Patient seen and examined, denies any complaints. Awaiting TEE.  Objective: Vital signs in last 24 hours: Temp:  [98.1 F (36.7 C)-99.3 F (37.4 C)] 99.2 F (37.3 C) (11/19 1001) Pulse Rate:  [84-93] 89  (11/19 1001) Resp:  [16-18] 18  (11/19 1001) BP: (113-152)/(63-84) 131/67 mmHg (11/19 1545) SpO2:  [88 %-97 %] 92 % (11/19 1545) Weight change:  Last BM Date: 03/19/11  Intake/Output from previous day: 11/18 0701 - 11/19 0700 In: -  Out: 700 [Urine:700]     Physical Exam: General: Alert, awake, oriented x3.  Heart: Regular rate and rhythm, without murmurs, rubs, gallops.  Lungs: Clear to auscultation bilaterally.  Abdomen: Soft, nontender, nondistended, positive bowel sounds.  Extremities: No edema with positive pedal pulses.  Neuro:alert ,ox 3,moves all extremities .     Lab Results: Results for orders placed during the hospital encounter of 03/16/11 (from the past 24 hour(s))  GLUCOSE, CAPILLARY     Status: Normal   Collection Time   03/20/11  4:18 PM      Component Value Range   Glucose-Capillary 94  70 - 99 (mg/dL)  GLUCOSE, CAPILLARY     Status: Abnormal   Collection Time   03/20/11  9:12 PM      Component Value Range   Glucose-Capillary 116 (*) 70 - 99 (mg/dL)  CBC     Status: Abnormal   Collection Time   03/21/11  6:10 AM      Component Value Range   WBC 11.2 (*) 4.0 - 10.5 (K/uL)   RBC 3.11 (*) 3.87 - 5.11 (MIL/uL)   Hemoglobin 9.7 (*) 12.0 - 15.0 (g/dL)   HCT 40.9 (*) 81.1 - 46.0 (%)   MCV 92.3  78.0 - 100.0 (fL)   MCH 31.2  26.0 - 34.0 (pg)   MCHC 33.8  30.0 - 36.0 (g/dL)   RDW 91.4  78.2 - 95.6 (%)   Platelets 280  150 - 400 (K/uL)  BASIC METABOLIC PANEL     Status: Abnormal   Collection Time   03/21/11  6:10 AM      Component Value Range   Sodium 140  135 - 145 (mEq/L)   Potassium 4.0  3.5 - 5.1 (mEq/L)   Chloride 100  96 - 112 (mEq/L)   CO2 31  19 - 32 (mEq/L)   Glucose, Bld 106 (*) 70 - 99 (mg/dL)   BUN 16  6 - 23 (mg/dL)     Creatinine, Ser 2.13  0.50 - 1.10 (mg/dL)   Calcium 9.4  8.4 - 08.6 (mg/dL)   GFR calc non Af Amer 84 (*) >90 (mL/min)   GFR calc Af Amer >90  >90 (mL/min)  GLUCOSE, CAPILLARY     Status: Abnormal   Collection Time   03/21/11  6:26 AM      Component Value Range   Glucose-Capillary 103 (*) 70 - 99 (mg/dL)  GLUCOSE, CAPILLARY     Status: Normal   Collection Time   03/21/11 11:58 AM      Component Value Range   Glucose-Capillary 98  70 - 99 (mg/dL)    Studies/Results: No results found.  Medications:    . ampicillin (OMNIPEN) IV  1 g Intravenous Q4H  . antiseptic oral rinse  15 mL Mouth Rinse BID  . docusate sodium  100 mg Oral BID  . FLUoxetine  20 mg Oral Daily  . gabapentin  400 mg Oral TID  . gentamicin  60 mg Intravenous Q12H  . nicotine  21 mg Transdermal Daily  . rosuvastatin  20 mg Oral q1800  . sodium chloride  3 mL Intravenous Q12H    acetaminophen, acetaminophen, albuterol, ALPRAZolam, cyclobenzaprine, HYDROmorphone, ipratropium, menthol-cetylpyridinium, naloxone (NARCAN) injection, ondansetron (ZOFRAN) IV, ondansetron (ZOFRAN) IV, ondansetron, phenol, sodium chloride, sodium phosphate     Assessment/Plan:  Principal Problem:  *Epidural abscess  Active Problems:  Sepsis/enterococcus bactremia  History of TTP (thrombotic thrombocytopenic purpura)  COPD (chronic obstructive pulmonary disease)  Plan:  S/P lumbar aminectomy/decompression microdiscectomy  Continue antibiotics as per ID for at least 6 weeks,will order piccline . Awaiting TEE to rule out endocarditis. Continue PT       LOS: 5 days   [name] 03/21/2011, 4:03 PM

## 2011-03-21 NOTE — Progress Notes (Signed)
ANTIBIOTIC CONSULT NOTE - FOLLOW UP  Pharmacy Consult for gentamicin Indication: epidural abscess and bacteremia  Allergies  Allergen Reactions  . Other     Wool: Reaction is hives Advertising account executive tape: Reaction is blistering  . Sulfa Antibiotics Hives  . Sulfa Drugs Cross Reactors     Patient Measurements: Height: 5\' 4"  (162.6 cm) Weight: 174 lb 9.7 oz (79.2 kg) IBW/kg (Calculated) : 54.7  Adjusted Body Weight:   Vital Signs: Temp: 98.5 F (36.9 C) (11/19 0600) BP: 150/84 mmHg (11/19 0600) Pulse Rate: 93  (11/19 0600) Intake/Output from previous day: 11/18 0701 - 11/19 0700 In: -  Out: 700 [Urine:700] Intake/Output from this shift:    Labs:  [facility] 03/21/11 0610 03/19/11 0755  WBC 11.2* --  HGB 9.7* --  PLT 280 --  LABCREA -- --  CREATININE 0.77 0.67   Estimated Creatinine Clearance: 68.5 ml/min (by C-G formula based on Cr of 0.77). No results found for this basename: VANCOTROUGH:2,VANCOPEAK:2,VANCORANDOM:2,GENTTROUGH:2,GENTPEAK:2,GENTRANDOM:2,TOBRATROUGH:2,TOBRAPEAK:2,TOBRARND:2,AMIKACINPEAK:2,AMIKACINTROU:2,AMIKACIN:2, in the last 72 hours   Microbiology: Recent Results (from the past 720 hour(s))  FUNGUS CULTURE W SMEAR     Status: Normal (Preliminary result)   Collection Time   03/17/11  3:23 AM      Component Value Range Status Comment   Specimen Description ABSCESS BACK   Final    Special Requests     Final    Value: L2 3 LUMBAR EPIDURAL PATIENT ON FOLLOWING GENTAMYCIN   Fungal Smear NO YEAST OR FUNGAL ELEMENTS SEEN   Final    Culture CULTURE IN PROGRESS FOR FOUR WEEKS   Final    Report Status PENDING   Incomplete   GRAM STAIN     Status: Normal   Collection Time   03/17/11  3:23 AM      Component Value Range Status Comment   Specimen Description ABSCESS BACK   Final    Special Requests     Final    Value: L2 3 LUMBAR EPIDURAL PATIENT ON FOLLOWING GENTAMYCIN   Gram Stain     Final    Value: RARE WBC PRESENT,BOTH PMN AND MONONUCLEAR      NO ORGANISMS SEEN     CALLED TO E BETHEL, RN 03/17/11 0838 BY K SCHULTZ   Report Status 03/17/2011 FINAL   Final   CULTURE, ROUTINE-ABSCESS (INCLUDES ANAEROBIC)     Status: Normal   Collection Time   03/17/11  3:23 AM      Component Value Range Status Comment   Specimen Description ABSCESS BACK   Final    Special Requests     Final    Value: L2 3 LUMBAR EPIDURAL PATIENT ON FOLLOWING GENTAMYCIN   Gram Stain     Final    Value: FEW WBC PRESENT,BOTH PMN AND MONONUCLEAR     NO ORGANISMS SEEN   Culture NO GROWTH 3 DAYS   Final    Report Status 03/20/2011 FINAL   Final   ANAEROBIC CULTURE     Status: Normal (Preliminary result)   Collection Time   03/17/11  3:23 AM      Component Value Range Status Comment   Specimen Description ABSCESS BACK   Final    Special Requests     Final    Value: L2 3 LUMBAR EPIDURAL PATIENT ON FOLLOWING GENTAMYCIN   Gram Stain     Final    Value: RARE WBC PRESENT,BOTH PMN AND MONONUCLEAR     NO ORGANISMS SEEN     CALLED TO E  BETHEL  RN 03/17/11 0838 BY K SCHULTZ Performed at [facility]   Culture     Final    Value: NO ANAEROBES ISOLATED; CULTURE IN PROGRESS FOR 5 DAYS   Report Status PENDING   Incomplete   GRAM STAIN     Status: Normal   Collection Time   03/17/11  4:43 AM      Component Value Range Status Comment   Specimen Description ABSCESS BACK   Final    Special Requests     Final    Value: LUMBAR THREE AND FOUR EPIDURAL PATIENT ON FOLLOWING GENTAMYCIN   Gram Stain     Final    Value: FEW WBC PRESENT,BOTH PMN AND MONONUCLEAR     NO ORGANISMS SEEN     CALLED TO E BETHEL, RN 03/17/11 0838 BY K SCHULTZ   Report Status 03/17/2011 FINAL   Final   FUNGUS CULTURE W SMEAR     Status: Normal (Preliminary result)   Collection Time   03/17/11  4:43 AM      Component Value Range Status Comment   Specimen Description ABSCESS BACK   Final    Special Requests     Final    Value: LUMBAR THREE AND FOUR EPIDURAL PATIENT ON FOLLOWING GENTAMYCIN    Fungal Smear NO YEAST OR FUNGAL ELEMENTS SEEN   Final    Culture CULTURE IN PROGRESS FOR FOUR WEEKS   Final    Report Status PENDING   Incomplete   CULTURE, ROUTINE-ABSCESS (INCLUDES ANAEROBIC)     Status: Normal   Collection Time   03/17/11  4:43 AM      Component Value Range Status Comment   Specimen Description ABSCESS BACK   Final    Special Requests     Final    Value: LUMBAR THREE AND FOUR EPIDURAL PATIENT ON FOLLOWING GENTAMYCIN   Gram Stain     Final    Value: FEW WBC PRESENT,BOTH PMN AND MONONUCLEAR     NO ORGANISMS SEEN   Culture NO GROWTH 3 DAYS   Final    Report Status 03/20/2011 FINAL   Final   ANAEROBIC CULTURE     Status: Normal (Preliminary result)   Collection Time   03/17/11  4:43 AM      Component Value Range Status Comment   Specimen Description ABSCESS BACK   Final    Special Requests     Final    Value: LUMBAR THREE AND FOUR EPIDURAL PATIENT ON FOLLOWING GENTAMYCIN   Gram Stain     Final    Value: FEW WBC PRESENT,BOTH PMN AND MONONUCLEAR     NO ORGANISMS SEEN     CALLED T E BETHAL RN 03/17/11 0838 BY K SCHULTZ Performed at [facility]   Culture     Final    Value: NO ANAEROBES ISOLATED; CULTURE IN PROGRESS FOR 5 DAYS   Report Status PENDING   Incomplete   MRSA PCR SCREENING     Status: Normal   Collection Time   03/18/11  9:41 AM      Component Value Range Status Comment   MRSA by PCR NEGATIVE  NEGATIVE  Final     Anti-infectives     Start     Dose/Rate Route Frequency Ordered Stop   03/17/11 0900   ceFAZolin (ANCEF) IVPB 1 g/50 mL premix        1 g 100 mL/hr over 30 Minutes Intravenous 3 times per day 03/17/11 0725 03/17/11 1807  03/17/11 0630   gentamicin (GARAMYCIN) IVPB 60 mg        60 mg 100 mL/hr over 30 Minutes Intravenous Every 12 hours 03/17/11 0001     03/17/11 0515   vancomycin (VANCOCIN) IVPB 1000 mg/200 mL premix        1,000 mg 200 mL/hr over 60 Minutes Intravenous To Neuro OR-Station #32 03/17/11 0502 03/18/11 0515    03/17/11 0300   50,000 units bacitracin in 0.9% normal saline 250 mL irrigation  Status:  Discontinued          As needed 03/17/11 0316 03/17/11 0607   03/17/11 0000   ampicillin (OMNIPEN) 1 g in sodium chloride 0.9 % 50 mL IVPB        1 g 150 mL/hr over 20 Minutes Intravenous 6 times per day 03/16/11 2259     03/17/11 0000   gentamicin (GARAMYCIN) IVPB 60 mg  Status:  Discontinued        60 mg 100 mL/hr over 30 Minutes Intravenous Every 8 hours 03/16/11 2346 03/17/11 0001          Assessment: Patient is a 70 y.o F on ampicillin and gentamicin day #7 for epidural abscess and bacteremia with plan to continue abx for at least 6 weeks.  TEE results pending.  Last gentamicin trough obtain on 11/16 was appropriate at 0.7.  Goal of Therapy:  Gentamicin trough <2, peak ~4  Plan:  1) continue current regimen of 60mg  IV q12h for now  Smt Lokey P 03/21/2011,9:30 AM

## 2011-03-21 NOTE — Progress Notes (Signed)
Subjective: Patient seen and examined, denies any complaints. Awaiting TEE.  Objective: Vital signs in last 24 hours: Temp:  [98.1 F (36.7 C)-99.3 F (37.4 C)] 99.2 F (37.3 C) (11/19 1001) Pulse Rate:  [84-93] 89  (11/19 1001) Resp:  [16-18] 18  (11/19 1001) BP: (113-152)/(63-84) 131/67 mmHg (11/19 1545) SpO2:  [88 %-97 %] 92 % (11/19 1545) Weight change:  Last BM Date: 03/19/11  Intake/Output from previous day: 11/18 0701 - 11/19 0700 In: -  Out: 700 [Urine:700]     Physical Exam: General: Alert, awake, oriented x3.  Heart: Regular rate and rhythm, without murmurs, rubs, gallops.  Lungs: Clear to auscultation bilaterally.  Abdomen: Soft, nontender, nondistended, positive bowel sounds.  Extremities: No edema with positive pedal pulses.  Neuro:alert ,ox 3,moves all extremities .     Lab Results: Results for orders placed during the hospital encounter of 03/16/11 (from the past 24 hour(s))  GLUCOSE, CAPILLARY     Status: Normal   Collection Time   03/20/11  4:18 PM      Component Value Range   Glucose-Capillary 94  70 - 99 (mg/dL)  GLUCOSE, CAPILLARY     Status: Abnormal   Collection Time   03/20/11  9:12 PM      Component Value Range   Glucose-Capillary 116 (*) 70 - 99 (mg/dL)  CBC     Status: Abnormal   Collection Time   03/21/11  6:10 AM      Component Value Range   WBC 11.2 (*) 4.0 - 10.5 (K/uL)   RBC 3.11 (*) 3.87 - 5.11 (MIL/uL)   Hemoglobin 9.7 (*) 12.0 - 15.0 (g/dL)   HCT 28.7 (*) 36.0 - 46.0 (%)   MCV 92.3  78.0 - 100.0 (fL)   MCH 31.2  26.0 - 34.0 (pg)   MCHC 33.8  30.0 - 36.0 (g/dL)   RDW 13.3  11.5 - 15.5 (%)   Platelets 280  150 - 400 (K/uL)  BASIC METABOLIC PANEL     Status: Abnormal   Collection Time   03/21/11  6:10 AM      Component Value Range   Sodium 140  135 - 145 (mEq/L)   Potassium 4.0  3.5 - 5.1 (mEq/L)   Chloride 100  96 - 112 (mEq/L)   CO2 31  19 - 32 (mEq/L)   Glucose, Bld 106 (*) 70 - 99 (mg/dL)   BUN 16  6 - 23 (mg/dL)     Creatinine, Ser 0.77  0.50 - 1.10 (mg/dL)   Calcium 9.4  8.4 - 10.5 (mg/dL)   GFR calc non Af Amer 84 (*) >90 (mL/min)   GFR calc Af Amer >90  >90 (mL/min)  GLUCOSE, CAPILLARY     Status: Abnormal   Collection Time   03/21/11  6:26 AM      Component Value Range   Glucose-Capillary 103 (*) 70 - 99 (mg/dL)  GLUCOSE, CAPILLARY     Status: Normal   Collection Time   03/21/11 11:58 AM      Component Value Range   Glucose-Capillary 98  70 - 99 (mg/dL)    Studies/Results: No results found.  Medications:    . ampicillin (OMNIPEN) IV  1 g Intravenous Q4H  . antiseptic oral rinse  15 mL Mouth Rinse BID  . docusate sodium  100 mg Oral BID  . FLUoxetine  20 mg Oral Daily  . gabapentin  400 mg Oral TID  . gentamicin  60 mg Intravenous Q12H  . nicotine    21 mg Transdermal Daily  . rosuvastatin  20 mg Oral q1800  . sodium chloride  3 mL Intravenous Q12H    acetaminophen, acetaminophen, albuterol, ALPRAZolam, cyclobenzaprine, HYDROmorphone, ipratropium, menthol-cetylpyridinium, naloxone (NARCAN) injection, ondansetron (ZOFRAN) IV, ondansetron (ZOFRAN) IV, ondansetron, phenol, sodium chloride, sodium phosphate     Assessment/Plan:  Principal Problem:  *Epidural abscess  Active Problems:  Sepsis/enterococcus bactremia  History of TTP (thrombotic thrombocytopenic purpura)  COPD (chronic obstructive pulmonary disease)  Plan:  S/P lumbar aminectomy/decompression microdiscectomy  Continue antibiotics as per ID for at least 6 weeks,will order piccline . Awaiting TEE to rule out endocarditis. Continue PT       LOS: 5 days   [name] 03/21/2011, 4:03 PM  

## 2011-03-21 NOTE — Progress Notes (Signed)
Utilization review complete 

## 2011-03-21 NOTE — Progress Notes (Signed)
INFECTIOUS DISEASES PROGRESS NOTE 70 yo F with enterococcal bacteremia and epidural abscess pod #1 laminectomy and IX D  Subjective: Doing better, no fevers, chills, nightsweats. She has been participating with physical therapy and noticing having cramping of thighs bilaterally. No weakness noted with ambulation. Patient is NPO awaiting to get TEE this afternoon.  Abtx: ampicillin 1gm Q4hr, gent 60mg  Q12    Objective: Vital signs in last 24 hours: Temp:  [98.1 F (36.7 C)-99.3 F (37.4 C)] 99.2 F (37.3 C) (11/19 1001) Pulse Rate:  [84-93] 89  (11/19 1001) Resp:  [16-18] 18  (11/19 1001) BP: (109-152)/(49-84) 152/68 mmHg (11/19 1001) SpO2:  [93 %-97 %] 96 % (11/19 1001) Weight change:  Last BM Date: 03/19/11    General Appearance:  Lying in bed, mild distress from pain, generalized tremor   Throat:  Lips, mucosa, and tongue normal; teeth and gums normal   Neck:  Supple, symmetrical, trachea midline, no adenopathy;  thyroid: no enlargement/tenderness/nodules; no carotid  bruit or JVD   Back:  Symmetric, no curvature, ROM normal, no CVA tenderness   Lungs:  Clear to auscultation bilaterally except faint crackles on right base, respirations unlabored   Chest Wall:  No tenderness or deformity   Heart:  Regular rate and rhythm, S1 and S2 normal, faint sys murmur at left sternal border, no rub/gallop  Abdomen:  Soft, non-tender, bowel sounds active all four quadrants,  no masses, no organomegaly   Extremities:  Trace edema. No clubbing/cyanosis  Pulses:  2+ and symmetric all extremities   Skin:  Skin color, texture, turgor normal, no rashes or lesions   Lymph nodes:  Cervical, supraclavicular, and axillary nodes normal   Neurologic:  CNII-XII intact, generalized reduced strength more in the lower extremity.normal sensation.      Lab Results:  [facility] 03/21/11 0610  WBC 11.2*  HGB 9.7*  HCT 28.7*  PLT 280   BMET  Basename 03/21/11 0610 03/19/11 0755  NA 140 138  K 4.0  3.9  CL 100 97  CO2 31 31  GLUCOSE 106* 108*  BUN 16 16  CREATININE 0.77 0.67  CALCIUM 9.4 9.3   MICRO: 11/15 lumbar NGTD 1/15 MRSA PCR screen : negative  Assessment/Plan: enterococcal bacteremia c/b spinal epidural abscess 1) continue with ampicillin and gentamicin at current doses. Will treat for at least 6 wks with IV antibiotics. Please repeat gent trough at next dose  2) await TEE results this afternoon to evaluate for vegetations in setting of new murmurs.   LOS: 5 days   Maebel Marasco 03/21/2011, 2:04 PM

## 2011-03-21 NOTE — Interval H&P Note (Signed)
History and Physical Interval Note:   03/21/2011   4:46 PM   [name]  has presented today for surgery, with the diagnosis of Sepsis and endocarditis  The various methods of treatment have been discussed with the patient and family. After consideration of risks, benefits and other options for treatment, the patient has consented to  Procedure(s): TRANSESOPHAGEAL ECHOCARDIOGRAM (TEE) as a surgical intervention .  The patients' history has been reviewed, patient examined, no change in status, stable for surgery.  I have reviewed the patients' chart and labs.  Questions were answered to the patient's satisfaction.     Pamella Pert  MD

## 2011-03-21 NOTE — Progress Notes (Signed)
Physical Therapy Treatment Patient Details Name: [name] MRN: [id number] DOB: 10/19/1940 Today's Date: 03/21/2011  PT Assessment/Plan  PT - Assessment/Plan PT Plan: Discharge plan remains appropriate PT Frequency: Min 5X/week Follow Up Recommendations: Home health PT;24 hour supervision/assistance Equipment Recommended: None recommended by PT PT Goals  Acute Rehab PT Goals PT Goal: Rolling Supine to Right Side - Progress: Met PT Goal: Rolling Supine to Left Side - Progress: Progressing toward goal PT Transfer Goal: Sit to Stand/Stand to Sit - Progress: Progressing toward goal PT Goal: Ambulate - Progress: Progressing toward goal  PT Treatment Precautions/Restrictions  Precautions Precautions: Back Precaution Booklet Issued: Yes (comment) (handout given & placed in room) Precaution Comments: Pt able to recall 1/3 back precautions (no twisting).  Reviewed all 3 back precautions & gave pt handout.   Required Braces or Orthoses: No Restrictions Weight Bearing Restrictions: No Mobility (including Balance) Bed Mobility Rolling Left: 5: Supervision Rolling Left Details (indicate cue type and reason): HOB flat, no rail, cues for UE positioning/use.   Left Sidelying to Sit: HOB flat;5: Supervision Left Sidelying to Sit Details (indicate cue type and reason): cues for positioning/use of L UE to push up onto R UE & to push shoulders/trunk to sitting  Transfers Sit to Stand: Other (comment);4: Min assist;From toilet (min guard) Sit to Stand Details (indicate cue type and reason): min assist to stand from toilet (low surface); cues for safe hand placement & technique Stand to Sit: Other (comment) (min guard) Stand to Sit Details: cues for safe hand placement Ambulation/Gait Ambulation/Gait Assistance: Other (comment) (min guard) Ambulation/Gait Assistance Details (indicate cue type and reason): min guard (without physical contact) for safety due to pt c/o of weakness, but no  buckling of LE's noted; cues to increase step/stride length, increase floor clearance.   Ambulation Distance (Feet): 75 Feet Assistive device: Rolling walker Gait Pattern: Decreased step length - right;Decreased step length - left;Step-to pattern;Decreased hip/knee flexion - left;Decreased hip/knee flexion - right;Shuffle Stairs: No Wheelchair Mobility Wheelchair Mobility: No    Exercise    End of Session PT - End of Session Equipment Utilized During Treatment: Gait belt Activity Tolerance: Patient tolerated treatment well Patient left: in chair General Behavior During Session: [facility] for tasks performed Cognition: [facility] for tasks performed  Lara Mulch 03/21/2011, 1:33 PM Verdell Face, PTA [phone]

## 2011-03-22 ENCOUNTER — Inpatient Hospital Stay (HOSPITAL_COMMUNITY): Payer: Medicare Other

## 2011-03-22 DIAGNOSIS — G062 Extradural and subdural abscess, unspecified: Secondary | ICD-10-CM

## 2011-03-22 LAB — ANAEROBIC CULTURE

## 2011-03-22 MED ORDER — SODIUM CHLORIDE 0.9 % IJ SOLN
10.0000 mL | INTRAMUSCULAR | Status: DC | PRN
  Administered 2011-03-23: 10 mL

## 2011-03-22 MED ORDER — SODIUM CHLORIDE 0.9 % IJ SOLN
10.0000 mL | Freq: Two times a day (BID) | INTRAMUSCULAR | Status: DC
  Administered 2011-03-23: 10 mL via INTRAVENOUS

## 2011-03-22 MED ORDER — SODIUM CHLORIDE 0.9 % IV SOLN
2.0000 g | INTRAVENOUS | Status: DC
  Administered 2011-03-22 – 2011-03-23 (×7): 2 g via INTRAVENOUS
  Filled 2011-03-22 (×12): qty 2000

## 2011-03-22 NOTE — Plan of Care (Signed)
Problem: Phase II Progression Outcomes Goal: Discharge plan established Outcome: Progressing CSW is following patient to try to find placement in SNF for rehab  Problem: Phase III Progression Outcomes Goal: Pain controlled on oral analgesia Outcome: Completed/Met Date Met:  03/22/11 Pt has had no c/o pain today Goal: Voiding independently Outcome: Completed/Met Date Met:  03/22/11 Pt. Is voiding in the [facility] with 1 assist for transfers.  Pt. Is continent Goal: Discharge plan remains appropriate-arrangements made Outcome: Progressing CSW is following patient trying to place in SNF for rehab.

## 2011-03-22 NOTE — Progress Notes (Addendum)
  INFECTIOUS DISEASES PROGRESS NOTE 70 yo F with enterococcal bacteremia and epidural abscess pod #1 laminectomy and IX D  Subjective: Underwent TEE yesterday which found: MV vegetation measuring 1cmx0.3 cm noted on the anterior MV leaflet. Mild MR. Secundum ASD small noted.  She feels better, denies fever/chills/nightsweats/cough/n/v. backpain is improving, continues to ambulate with PT, felt like her legs "buckled" no fall, no muscle cramping   Abtx: ampicillin 2gm Q4hr, gent 60mg  Q12  Medications: reviewed  Objective: BP 122/63  Pulse 84  Temp(Src) 98.7 F (37.1 C) (Oral)  Resp 18  Ht 5\' 4"  (1.626 m)  Wt 79.2 kg (174 lb 9.7 oz)  BMI 29.97 kg/m2  SpO2 92%   General Appearance:  Lying in bed, mild distress from pain, generalized tremor   Throat:  Lips, mucosa, and tongue normal; teeth and gums normal   Neck:  Supple, symmetrical, trachea midline, no adenopathy;  thyroid: no enlargement/tenderness/nodules; no carotid  bruit or JVD   Back:  Symmetric, no curvature, ROM normal, no CVA tenderness   Lungs:  Clear to auscultation bilaterally except faint crackles on right base, respirations unlabored   Chest Wall:  No tenderness or deformity   Heart:  Regular rate and rhythm, S1 and S2 normal, faint sys murmur at left sternal border, no rub/gallop  Abdomen:  Soft, non-tender, bowel sounds active all four quadrants,  no masses, no organomegaly   Extremities:  Trace edema. No clubbing/cyanosis, + splinter hemorrhages  Pulses:  2+ and symmetric all extremities   Skin:  Skin color, texture, turgor normal, no rashes or lesions   Lymph nodes:  Cervical, supraclavicular, and axillary nodes normal   Neurologic:  CNII-XII intact, generalized reduced strength more in the lower extremity.normal sensation.      Lab Results:  [facility] 03/21/11 0610  WBC 11.2*  HGB 9.7*  HCT 28.7*  PLT 280   BMET  Basename 03/21/11 0610  NA 140  K 4.0  CL 100  CO2 31  GLUCOSE 106*  BUN 16    CREATININE 0.77  CALCIUM 9.4   Imaging: 1/19 TEE :MV vegetation measuring 1cmx0.3 cm noted on the anterior MV leaflet. Mild MR. Secundum ASD small noted.  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 Will treat for at least 6 wks with IV antibiotics. 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 Since patient was slightly altered for up to 4 days after her laminectomy and recent finding of ASD, I am concern for septic emboli to the brain parenchyma. I would recommend to get a brain MRI to look for embolic phenomenon as this would extend her current course of therapy.   LOS: 6 days   Judyann Munson 03/22/2011, 4:47 PM

## 2011-03-22 NOTE — Progress Notes (Signed)
Attempted to power flush PICC and flip tip down but PICC is looped into SVC with tip in subclavian vein.  Marylene Land, RN updated and to have interventional radiology reposition in am. Brigido Mera, Lajean Manes

## 2011-03-22 NOTE — Progress Notes (Signed)
PICC placed in right brachial vein, had difficulty with threading line.  Once line to SVC per [employer], PICC flipped on itself.  Despite multiple attempts to repositioning using power flushing and patient position change, unable to flip PICC tip down.  Plan to return in 1.5 hours, sit patient up and as forward as possible, power flush and check position by PCXR.  If PICC tip is not down, patient will need to go to IR for repositioning. Ary Rudnick, Lajean Manes

## 2011-03-22 NOTE — Progress Notes (Signed)
03/22/11- 1200- Donn Pierini RN, BSN [phone] Spoke with pt's husband at bedside regarding discharge plans- PTA pt lived at home per conversation with husband - both pt and family are concerned about pt returning home at discharge and would like to look into [facility] for rehab has pt needs long term IV abx and is deconditioned and needs PT at discharge. Family would like to look in [facility], CSW consulted for possible placement.

## 2011-03-22 NOTE — Progress Notes (Signed)
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  Lumbar spine x-ray IMPRESSION:  Intraoperative localization of the L1-2 disc space.  Procedures 03/17/11 Decompressive lumbar laminectomy L1-2, L2-3, L3-4, with microscopic dissection of the left L2-3 and IV nerve roots and microscopic evacuation of the ventral lumbar epidural abscess  TRANSESOPHAGEAL ECHOCARDIOGRAM (TEE)  MV vegetation measuring 1cmx0.3 cm noted on the anterior MV leaflet. Mild MR. Secundum ASD small noted. Mild atheresclerotic changes of arch of aorta.   HPI:  This is a 70 years old Caucasian woman who was transferred from [facility] for higher level of care after her MRI spine showed evidence of epidural abscess.  No discharge summary is available. Patient is currently lethargic and unable to provide any history. History is obtained from daughter and husband and packets of information [facility]. At present the patient presented to this hospital on 11/ 7 with chief complaint of back pain and fever she was discharged on November 8 however because of worsening back pain and lower extremity weakness patient was brought back to the [facility] ER. She was admitted with the diagnosis of fever and altered mental status. Blood cultures grew enteococcus speciesourcewas initially thought to be UTI patient was treated with ampicillin and gentamicin as the infection was disease recommendation. further workup including MRI of the spine  showed epidural abscess at the level of L1-L3 with severe mass effect on the thecal sac at the level of L3 and wouldn't mass effect on the thecal sac at the level of L2. Patient was sent to [facility] for further evaluation and management   Hospital course Principal Problem:  *Epidural abscess  Status post lumbar laminectomy/decompression/microdiscectomy  Active Problems:  Sepsis/enterococcus bactremia  Seen by infectious disease, on gentamicin and ampicillin to complete 6 weeks of therapy, will order for PICC line today Endocarditis TEE as above, continue antibiotics for 6 weeks. Followup FULL TEE report. History of TTP (thrombotic thrombocytopenic purpura)  Stable COPD (chronic obstructive pulmonary disease)  Stable, continue nebs when necessary Disposition Patient was seen by PT, initial plan was to DC home with home health PT. Patient expressed her wishes to be discharged to short-term  SNF, social worker was informed.  Subjective: Patient seen and examined, denies any complaints.  Objective: Vital signs in last 24 hours: Temp:  [98.6 F (37 C)-99.4 F (37.4 C)] 98.9 F (37.2 C) (11/20 1018) Pulse Rate:  [57-92] 86  (11/20 1018) Resp:  [11-26] 18  (11/20 1018) BP: (102-137)/(53-75) 119/71 mmHg (11/20 1018) SpO2:  [88 %-98 %] 93 % (11/20 1018) FiO2 (%):  [0 %] 0 % (11/19 1613) Weight change:  Last BM Date: 03/19/11  Intake/Output from previous day: 11/19 0701 - 11/20 0700 In: 150 [IV Piggyback:150] Out: -      Physical Exam: General: Alert, awake, oriented x3.  Heart: Regular rate and rhythm, without murmurs, rubs, gallops.  Lungs: Clear to auscultation bilaterally.  Abdomen: Soft, nontender, nondistended, positive bowel sounds.  Extremities: No edema with positive pedal pulses.  Neuro:alert ,ox 3,moves all extremities .   Lab Results: Results for orders placed during the hospital encounter of 03/16/11 (from the past 24 hour(s))  GLUCOSE, CAPILLARY      Status: Normal   Collection Time   03/21/11 11:58 AM      Component Value Range   Glucose-Capillary 98  70 - 99 (mg/dL)  GLUCOSE, CAPILLARY     Status: Abnormal   Collection Time   03/21/11  6:59 PM      Component Value Range   Glucose-Capillary 113 (*) 70 - 99 (mg/dL)  GLUCOSE, CAPILLARY     Status: Normal   Collection Time   03/22/11  6:49 AM      Component Value Range   Glucose-Capillary 89  70 - 99 (mg/dL)   Comment 1 Documented in Chart     Comment 2 Notify RN      Studies/Results: Medications:    . ampicillin (OMNIPEN) IV  2 g Intravenous Q4H  . antiseptic oral rinse  15 mL Mouth Rinse BID  . docusate sodium  100 mg Oral BID  . fentaNYL  250 mcg Intravenous Once  . FLUoxetine  20 mg Oral Daily  . gabapentin  400 mg Oral TID  . gentamicin  60 mg Intravenous Q12H  . midazolam  10 mg Intravenous Once  . nicotine  21 mg Transdermal Daily  . rosuvastatin  20 mg Oral q1800  . sodium chloride  3 mL Intravenous Q12H  . DISCONTD: ampicillin (OMNIPEN) IV  1 g Intravenous Q4H    acetaminophen, acetaminophen, albuterol, ALPRAZolam, benzocaine, cyclobenzaprine, HYDROmorphone, ipratropium, menthol-cetylpyridinium, naloxone (NARCAN) injection, ondansetron (ZOFRAN) IV, ondansetron (ZOFRAN) IV, ondansetron, phenol, sodium chloride, sodium phosphate, DISCONTD: butamben-tetracaine-benzocaine, DISCONTD: fentaNYL, DISCONTD: midazolam     Assessment/Plan:  As above.   LOS: 6 days   Anesha Hackert 03/22/2011, 10:34 AM

## 2011-03-22 NOTE — Progress Notes (Signed)
Clinical Social Work-Please see shadow chart for full assessment-CSW initiated FL2 and bed search in [place]-CSW S. [phone]- will provide bed offers and facilitate d/c. Jodean Lima, [phone]

## 2011-03-22 NOTE — Progress Notes (Signed)
ANTIBIOTIC CONSULT NOTE - FOLLOW UP  Pharmacy Consult for Ampicillin & Gentamicin #8/42 Indication: Epidural abscess/bacteremia/MV vegetation per TEE  Allergies  Allergen Reactions  . Other     Wool: Reaction is hives Advertising account executive tape: Reaction is blistering  . Sulfa Antibiotics Hives  . Sulfa Drugs Cross Reactors     Patient Measurements: Height: 5\' 4"  (162.6 cm) Weight: 174 lb 9.7 oz (79.2 kg) IBW/kg (Calculated) : 54.7   Vital Signs: Temp: 98.6 F (37 C) (11/20 0600) Temp src: Oral (11/20 0600) BP: 118/75 mmHg (11/20 0600) Pulse Rate: 88  (11/20 0600) Intake/Output from previous day: 11/19 0701 - 11/20 0700 In: 150 [IV Piggyback:150] Out: -  Intake/Output from this shift:    Labs:  [facility] 03/21/11 0610  WBC 11.2*  HGB 9.7*  PLT 280  LABCREA --  CREATININE 0.77   Estimated Creatinine Clearance: 68.5 ml/min (by C-G formula based on Cr of 0.77). No results found for this basename: VANCOTROUGH:2,VANCOPEAK:2,VANCORANDOM:2,GENTTROUGH:2,GENTPEAK:2,GENTRANDOM:2,TOBRATROUGH:2,TOBRAPEAK:2,TOBRARND:2,AMIKACINPEAK:2,AMIKACINTROU:2,AMIKACIN:2, in the last 72 hours   Microbiology: Recent Results (from the past 720 hour(s))  FUNGUS CULTURE W SMEAR     Status: Normal (Preliminary result)   Collection Time   03/17/11  3:23 AM      Component Value Range Status Comment   Specimen Description ABSCESS BACK   Final    Special Requests     Final    Value: L2 3 LUMBAR EPIDURAL PATIENT ON FOLLOWING GENTAMYCIN   Fungal Smear NO YEAST OR FUNGAL ELEMENTS SEEN   Final    Culture CULTURE IN PROGRESS FOR FOUR WEEKS   Final    Report Status PENDING   Incomplete   GRAM STAIN     Status: Normal   Collection Time   03/17/11  3:23 AM      Component Value Range Status Comment   Specimen Description ABSCESS BACK   Final    Special Requests     Final    Value: L2 3 LUMBAR EPIDURAL PATIENT ON FOLLOWING GENTAMYCIN   Gram Stain     Final    Value: RARE WBC PRESENT,BOTH PMN  AND MONONUCLEAR     NO ORGANISMS SEEN     CALLED TO E BETHEL, RN 03/17/11 0838 BY K SCHULTZ   Report Status 03/17/2011 FINAL   Final   CULTURE, ROUTINE-ABSCESS (INCLUDES ANAEROBIC)     Status: Normal   Collection Time   03/17/11  3:23 AM      Component Value Range Status Comment   Specimen Description ABSCESS BACK   Final    Special Requests     Final    Value: L2 3 LUMBAR EPIDURAL PATIENT ON FOLLOWING GENTAMYCIN   Gram Stain     Final    Value: FEW WBC PRESENT,BOTH PMN AND MONONUCLEAR     NO ORGANISMS SEEN   Culture NO GROWTH 3 DAYS   Final    Report Status 03/20/2011 FINAL   Final   ANAEROBIC CULTURE     Status: Normal (Preliminary result)   Collection Time   03/17/11  3:23 AM      Component Value Range Status Comment   Specimen Description ABSCESS BACK   Final    Special Requests     Final    Value: L2 3 LUMBAR EPIDURAL PATIENT ON FOLLOWING GENTAMYCIN   Gram Stain     Final    Value: RARE WBC PRESENT,BOTH PMN AND MONONUCLEAR     NO ORGANISMS SEEN     CALLED TO E BETHEL  RN 03/17/11 0838 BY Kirtland Bouchard SCHULTZ Performed at [facility]   Culture     Final    Value: NO ANAEROBES ISOLATED; CULTURE IN PROGRESS FOR 5 DAYS   Report Status PENDING   Incomplete   GRAM STAIN     Status: Normal   Collection Time   03/17/11  4:43 AM      Component Value Range Status Comment   Specimen Description ABSCESS BACK   Final    Special Requests     Final    Value: LUMBAR THREE AND FOUR EPIDURAL PATIENT ON FOLLOWING GENTAMYCIN   Gram Stain     Final    Value: FEW WBC PRESENT,BOTH PMN AND MONONUCLEAR     NO ORGANISMS SEEN     CALLED TO E BETHEL, RN 03/17/11 0838 BY K SCHULTZ   Report Status 03/17/2011 FINAL   Final   FUNGUS CULTURE W SMEAR     Status: Normal (Preliminary result)   Collection Time   03/17/11  4:43 AM      Component Value Range Status Comment   Specimen Description ABSCESS BACK   Final    Special Requests     Final    Value: LUMBAR THREE AND FOUR EPIDURAL PATIENT ON  FOLLOWING GENTAMYCIN   Fungal Smear NO YEAST OR FUNGAL ELEMENTS SEEN   Final    Culture CULTURE IN PROGRESS FOR FOUR WEEKS   Final    Report Status PENDING   Incomplete   CULTURE, ROUTINE-ABSCESS (INCLUDES ANAEROBIC)     Status: Normal   Collection Time   03/17/11  4:43 AM      Component Value Range Status Comment   Specimen Description ABSCESS BACK   Final    Special Requests     Final    Value: LUMBAR THREE AND FOUR EPIDURAL PATIENT ON FOLLOWING GENTAMYCIN   Gram Stain     Final    Value: FEW WBC PRESENT,BOTH PMN AND MONONUCLEAR     NO ORGANISMS SEEN   Culture NO GROWTH 3 DAYS   Final    Report Status 03/20/2011 FINAL   Final   ANAEROBIC CULTURE     Status: Normal (Preliminary result)   Collection Time   03/17/11  4:43 AM      Component Value Range Status Comment   Specimen Description ABSCESS BACK   Final    Special Requests     Final    Value: LUMBAR THREE AND FOUR EPIDURAL PATIENT ON FOLLOWING GENTAMYCIN   Gram Stain     Final    Value: FEW WBC PRESENT,BOTH PMN AND MONONUCLEAR     NO ORGANISMS SEEN     CALLED T E BETHAL RN 03/17/11 0838 BY K SCHULTZ Performed at [facility]   Culture     Final    Value: NO ANAEROBES ISOLATED; CULTURE IN PROGRESS FOR 5 DAYS   Report Status PENDING   Incomplete   MRSA PCR SCREENING     Status: Normal   Collection Time   03/18/11  9:41 AM      Component Value Range Status Comment   MRSA by PCR NEGATIVE  NEGATIVE  Final     Anti-infectives     Start     Dose/Rate Route Frequency Ordered Stop   03/17/11 0900   ceFAZolin (ANCEF) IVPB 1 g/50 mL premix        1 g 100 mL/hr over 30 Minutes Intravenous 3 times per day 03/17/11 0725 03/17/11 1807   03/17/11  0630   gentamicin (GARAMYCIN) IVPB 60 mg        60 mg 100 mL/hr over 30 Minutes Intravenous Every 12 hours 03/17/11 0001     03/17/11 0515   vancomycin (VANCOCIN) IVPB 1000 mg/200 mL premix        1,000 mg 200 mL/hr over 60 Minutes Intravenous To Neuro OR-Station #32 03/17/11  0502 03/18/11 0515   03/17/11 0300   50,000 units bacitracin in 0.9% normal saline 250 mL irrigation  Status:  Discontinued          As needed 03/17/11 0316 03/17/11 0607   03/17/11 0000   ampicillin (OMNIPEN) 1 g in sodium chloride 0.9 % 50 mL IVPB        1 g 150 mL/hr over 20 Minutes Intravenous 6 times per day 03/16/11 2259     03/17/11 0000   gentamicin (GARAMYCIN) IVPB 60 mg  Status:  Discontinued        60 mg 100 mL/hr over 30 Minutes Intravenous Every 8 hours 03/16/11 2346 03/17/11 0001          Goal of Therapy:  Gentamicin trough level <2 mcg/ml  Assessment & Plan:  70yo female on day #8 of 6 weeks Ampicillin + Gentamicin-synergy.  TEE resulted yesterday with (+)vegetation on MV.  Steady state gentamicin trough on 11/16 was 0.7, appropriate for synergy.  No labs today, but Cr was stable at 0.77 on 11/19.  I/Os have not been well documented.  1.  Continue Gentamicin 60 mg IV q12 and Ampicillin 1gm IV q4. 2.  Weekly surveillance troughs- next due 11/23, or earlier if changes in clinical state indicate need.  Marisue Humble P 03/22/2011,9:22 AM

## 2011-03-22 NOTE — Progress Notes (Signed)
Physical Therapy Treatment Patient Details Name: [name] MRN: [id number] DOB: 1940-09-03 Today's Date: 03/22/2011  PT Assessment/Plan  PT - Assessment/Plan Comments on Treatment Session: Encouraged ambulation 3-4x's/day with Nsing.   PT Plan: Discharge plan remains appropriate PT Frequency: Min 5X/week Follow Up Recommendations: Home health PT;24 hour supervision/assistance Equipment Recommended: None recommended by PT PT Goals  Acute Rehab PT Goals PT Goal: Rolling Supine to Right Side - Progress: Met PT Goal: Supine/Side to Sit - Progress: Met PT Transfer Goal: Sit to Stand/Stand to Sit - Progress: Progressing toward goal PT Transfer Goal: Bed to Chair/Chair to Bed - Progress: Progressing toward goal PT Goal: Ambulate - Progress: Progressing toward goal  PT Treatment Precautions/Restrictions  Precautions Precautions: Back Precaution Booklet Issued: Yes (comment) (handout given & placed in room) Precaution Comments: Pt recalled 2/3 back precautions (bending & twisting) but required (A) to recall "no arching".   Required Braces or Orthoses: No Restrictions Weight Bearing Restrictions: No Mobility (including Balance) Bed Mobility Rolling Left: 5: Supervision Rolling Left Details (indicate cue type and reason): cues for sequencing, no twisting, use of UE's to increase ease of trransition Left Sidelying to Sit: 5: Supervision;HOB flat Left Sidelying to Sit Details (indicate cue type and reason): cues for UE placement/use to increase ease of transition, no twisting, sequencing.   Sitting - Scoot to Edge of Bed: 6: Modified independent (Device/Increase time) Transfers Sit to Stand: Other (comment);From toilet;From bed;4: Min assist (min guard) Sit to Stand Details (indicate cue type and reason): cues for safe hand placement/technique; min (A) to achieve standing from toilet with use of grab bar.   Stand to Sit: 5: Supervision Stand to Sit Details: (S) for safe hand  placement & use of UE's to control descent.   Ambulation/Gait Ambulation/Gait Assistance: Other (comment);4: Min assist (min guard) Ambulation/Gait Assistance Details (indicate cue type and reason): min guard for safety due to weak legs; pt c/o of increased weakness in bil legs with increased distance- pt with one episode of R knee buckling requring min (A) for support as we were returning to room.  Ambulation Distance (Feet): 130 Feet Assistive device: Rolling walker Gait Pattern: Step-through pattern;Decreased step length - right;Decreased step length - left;Decreased stride length    Exercise    End of Session PT - End of Session Equipment Utilized During Treatment: Gait belt Activity Tolerance: Patient tolerated treatment well Patient left: in chair;with call bell in reach General Behavior During Session: [facility] for tasks performed Cognition: [facility] for tasks performed  Lara Mulch 03/22/2011, 11:10 AM [phone]

## 2011-03-23 ENCOUNTER — Encounter (HOSPITAL_COMMUNITY): Payer: Self-pay | Admitting: Neurosurgery

## 2011-03-23 ENCOUNTER — Inpatient Hospital Stay (HOSPITAL_COMMUNITY): Payer: Medicare Other

## 2011-03-23 ENCOUNTER — Encounter: Payer: Self-pay | Admitting: Internal Medicine

## 2011-03-23 DIAGNOSIS — I330 Acute and subacute infective endocarditis: Secondary | ICD-10-CM

## 2011-03-23 DIAGNOSIS — M311 Thrombotic microangiopathy, unspecified: Secondary | ICD-10-CM

## 2011-03-23 DIAGNOSIS — A409 Streptococcal sepsis, unspecified: Principal | ICD-10-CM

## 2011-03-23 DIAGNOSIS — A419 Sepsis, unspecified organism: Secondary | ICD-10-CM

## 2011-03-23 LAB — COMPREHENSIVE METABOLIC PANEL
ALT: 126 U/L — ABNORMAL HIGH (ref 0–35)
CO2: 27 mEq/L (ref 19–32)
Calcium: 9.2 mg/dL (ref 8.4–10.5)
Creatinine, Ser: 0.78 mg/dL (ref 0.50–1.10)
GFR calc Af Amer: >90 mL/min (ref >90)
GFR calc non Af Amer: 84 mL/min — ABNORMAL LOW (ref >90)
Glucose, Bld: 97 mg/dL (ref 70–99)

## 2011-03-23 LAB — CBC
HCT: 29.4 % — ABNORMAL LOW (ref 36.0–46.0)
Hemoglobin: 9.8 g/dL — ABNORMAL LOW (ref 12.0–15.0)
MCH: 30.4 pg (ref 26.0–34.0)
MCV: 91.3 fL (ref 78.0–100.0)
RBC: 3.22 MIL/uL — ABNORMAL LOW (ref 3.87–5.11)

## 2011-03-23 LAB — GLUCOSE, CAPILLARY: Glucose-Capillary: 125 mg/dL — ABNORMAL HIGH (ref 70–99)

## 2011-03-23 MED ORDER — HEPARIN SOD (PORK) LOCK FLUSH 100 UNIT/ML IV SOLN
500.0000 [IU] | Freq: Once | INTRAVENOUS | Status: AC
  Administered 2011-03-23: 250 [IU] via INTRAVENOUS
  Filled 2011-03-23: qty 5

## 2011-03-23 MED ORDER — OXYCODONE-ACETAMINOPHEN 7.5-325 MG PO TABS: 1.0000 | ORAL_TABLET | ORAL | Status: DC | PRN

## 2011-03-23 MED ORDER — ROSUVASTATIN CALCIUM 20 MG PO TABS: 20.0000 mg | ORAL_TABLET | Freq: Every day | ORAL | Status: DC

## 2011-03-23 MED ORDER — POLYETHYLENE GLYCOL 3350 17 G PO PACK
17.0000 g | PACK | Freq: Two times a day (BID) | ORAL | Status: DC
Start: 2011-03-23 — End: 2011-03-23
  Administered 2011-03-23: 17 g via ORAL
  Filled 2011-03-23 (×2): qty 1

## 2011-03-23 MED ORDER — ALPRAZOLAM 0.25 MG PO TABS: 0.2500 mg | ORAL_TABLET | Freq: Three times a day (TID) | ORAL | Status: DC | PRN

## 2011-03-23 MED ORDER — FLEET ENEMA 7-19 GM/118ML RE ENEM
1.0000 | ENEMA | Freq: Every day | RECTAL | Status: DC | PRN
  Filled 2011-03-23: qty 1

## 2011-03-23 MED ORDER — GABAPENTIN 400 MG PO CAPS: 400.0000 mg | ORAL_CAPSULE | Freq: Three times a day (TID) | ORAL | Status: DC

## 2011-03-23 MED ORDER — SENNA 8.6 MG PO TABS: 2.0000 | ORAL_TABLET | Freq: Two times a day (BID) | ORAL | Status: DC

## 2011-03-23 MED ORDER — SENNA 8.6 MG PO TABS
2.0000 | ORAL_TABLET | Freq: Two times a day (BID) | ORAL | Status: DC
  Administered 2011-03-23: 17.2 mg via ORAL
  Filled 2011-03-23 (×4): qty 2

## 2011-03-23 MED ORDER — ACETAMINOPHEN 325 MG PO TABS: 650.0000 mg | ORAL_TABLET | ORAL | Status: AC | PRN

## 2011-03-23 MED ORDER — GENTAMICIN IN SALINE 1.2-0.9 MG/ML-% IV SOLN: 60.0000 mg | Freq: Two times a day (BID) | INTRAVENOUS | Status: DC

## 2011-03-23 MED ORDER — SODIUM CHLORIDE 0.9 % IV SOLN: 2.0000 g | INTRAVENOUS | Status: DC

## 2011-03-23 MED ORDER — POLYETHYLENE GLYCOL 3350 17 G PO PACK: 17.0000 g | PACK | Freq: Two times a day (BID) | ORAL | Status: AC

## 2011-03-23 MED ORDER — GADOBENATE DIMEGLUMINE 529 MG/ML IV SOLN
15.0000 mL | Freq: Once | INTRAVENOUS | Status: AC
  Administered 2011-03-23: 15 mL via INTRAVENOUS

## 2011-03-23 NOTE — Discharge Summary (Signed)
PATIENT DETAILS Name: [name] Age: 70 y.o. Sex: female Date of Birth: 25-May-1940 MRN: [id number]. Admit Date: 03/16/2011 Admitting Physician: Baltazar Najjar BMW:[id number] Morrisey  PRIMARY DISCHARGE DIAGNOSIS:  Principal Problem:  *Epidural abscess with discitis Active Problems:  Infective endocarditis Enterococcal bacteremia  History of TTP (thrombotic thrombocytopenic purpura)-off Imuran  COPD (chronic obstructive pulmonary disease)      PAST MEDICAL HISTORY: Past Medical History  Diagnosis Date  . Septicemia 03/2011  . Normal echocardiogram 03/15/11  . History of TTP (thrombotic thrombocytopenic purpura)   . Acute delirium   . Bacteremia 03/2011  . COPD (chronic obstructive pulmonary disease)   . Blood transfusion   . Anemia   . Arthritis   . Chronic back pain   . Spinal stenosis, lumbar   . Blood dyscrasia   . Depression     "mild"    DISCHARGE MEDICATIONS: Current Discharge Medication List    START taking these medications   Details  acetaminophen (TYLENOL) 325 MG tablet Take 2 tablets (650 mg total) by mouth every 4 (four) hours as needed (temp > 100.5). Qty: 30 tablet    gabapentin (NEURONTIN) 400 MG capsule Take 1 capsule (400 mg total) by mouth 3 (three) times daily.    gentamicin (GARAMYCIN) 1.2-0.9 MG/ML-% Inject 50 mLs (60 mg total) into the vein every 12 (twelve) hours. Qty: 50 mL    polyethylene glycol (MIRALAX / GLYCOLAX) packet Take 17 g by mouth 2 (two) times daily. Qty: 14 each     Take 1 tablet (20 mg total) by mouth daily at 6 PM.    senna (SENOKOT) 8.6 MG TABS Take 2 tablets (17.2 mg total) by mouth 2 (two) times daily. Qty: 120 each    sodium chloride 0.9 % SOLN 50 mL with ampicillin 2 G SOLR 2 g Inject 2 g into the vein every 4 (four) hours.         CONTINUE these medications which have CHANGED   Details  ALPRAZolam (XANAX) 0.25 MG tablet Take 1 tablet (0.25 mg total) by mouth 3 (three) times daily as needed for anxiety.  For pain and anxiety Qty: 15 tablet, Refills: 0    oxyCODONE-acetaminophen (PERCOCET) 7.5-325 MG per tablet Take 1 tablet by mouth every 4 (four) hours as needed. For pain Qty: 20 tablet, Refills: 0      CONTINUE these medications which have NOT CHANGED   Details  albuterol (PROVENTIL HFA;VENTOLIN HFA) 108 (90 BASE) MCG/ACT inhaler Inhale 2 puffs into the lungs every 6 (six) hours as needed. For shortness of breath     albuterol (PROVENTIL) (2.5 MG/3ML) 0.083% nebulizer solution Take 2.5 mg by nebulization every 6 (six) hours as needed. For wheezing     cyclobenzaprine (FLEXERIL) 10 MG tablet Take 10 mg by mouth 3 (three) times daily as needed. For pain/muscle spasms     FLUoxetine (PROZAC) 20 MG capsule Take 20 mg by mouth daily.      meloxicam (MOBIC) 7.5 MG tablet Take 7.5 mg by mouth daily.      !! rosuvastatin (CRESTOR) 20 MG tablet Take 20 mg by mouth daily.      tiotropium (SPIRIVA) 18 MCG inhalation capsule Place 18 mcg into inhaler and inhale daily.       !! - Potential duplicate medications found. Please discuss with provider.    STOP taking these medications     gabapentin (NEURONTIN) 800 MG tablet          BRIEF HPI:  See H&P, Labs, Consult  and Test reports for all details in brief, patient was transferred from [facility] after MRI showed evidence of epidural abscess. Blood cultures and [facility] was positive for enterococcus. She was admitted to the hospitalist service and a neurosurgical consultation was obtained. For further details please see the history and physical done by Dr. Cleotis Lema on admission.  CONSULTATIONS:   ID and Dr. Bradly Bienenstock) Dr. Gerhard Munch Dr.  Cyndie Chime  from the hematology service  PERTINENT RADIOLOGIC STUDIES: Mr Lodema Pilot Contrast  Apr 01, 2011  *RADIOLOGY REPORT*  Clinical Data: Rule out septic emboli or epidural abscess.  Sepsis  MRI HEAD WITHOUT AND WITH CONTRAST  Technique:  Multiplanar, multiecho pulse  sequences of the brain and surrounding structures were obtained according to standard protocol without and with intravenous contrast  Contrast: 15mL MULTIHANCE GADOBENATE DIMEGLUMINE 529 MG/ML IV SOLN  Comparison: None.  Findings: Ventricle size is normal.  Age appropriate atrophy. Small white matter hyperintensities bilaterally, most compatible with chronic microvascular ischemia.  Negative for acute infarct.  Negative for mass or edema in the brain.  No fluid collection or abscess is present.  Postcontrast imaging reveals normal enhancement.  Negative for metastatic disease or epidural abscess.  No brain abscess is identified.  Paranasal sinuses are clear.  Bilateral mastoid sinus effusion.  IMPRESSION: Mild chronic microvascular ischemia.  No acute intracranial abnormality.  Mastoid sinus effusion, left greater than right.  Original Report Authenticated By: Camelia Phenes, M.D.   Dg Lumbar Spine 1 View  03/17/2011  *RADIOLOGY REPORT*  Clinical Data: Instrument placement.  Intraoperative localization.  LUMBAR SPINE - 1 VIEW  Comparison: Intraoperative films from the same day.  Findings: This film is labeled 04:30 a.m.  The vertebral body levels are labeled.  The T12 compression fracture is again noted. Surgical probes are directed at the L2-3 and L4-5 disc spaces.  A sponge is in place posteriorly.  IMPRESSION:  1.  Intra operative localization of L2-3 and L4-5.  Original Report Authenticated By: Jamesetta Orleans. MATTERN, M.D.   Dg Lumbar Spine 1 View  03/17/2011  *RADIOLOGY REPORT*  Clinical Data: Preop.  Localization.  LUMBAR SPINE - 1 VIEW  Comparison: Single view lumbar spine from the same day at 03:00 a.m.  Findings: This film is labeled 03:15 a.m.  A surgical probe is directed to the L1-2 disc space.  Alignment is stable.  IMPRESSION: Intraoperative localization of the L1-2 disc space.  Original Report Authenticated By: Jamesetta Orleans. MATTERN, M.D.   Dg Lumbar Spine 1 View  03/17/2011  *RADIOLOGY  REPORT*  Clinical Data: Preoperative radiograph; needle placement for epidural abscess surgery.  LUMBAR SPINE - 1 VIEW  Comparison: None.  Findings: Multilevel disc space narrowing is noted along the lower lumbar spine, with associated sclerotic endplate change.  Vacuum phenomenon is noted at L2-L3.  There is a chronic compression deformity at vertebral body T12.  Bilateral facet disease is noted at the lower lumbar spine.  Scattered vascular calcifications are seen.  A needle is noted projecting along the superior aspect of the L2 spinous process.  IMPRESSION: Preoperative radiograph of the lumbar spine as described above; degenerative change noted along the lumbar spine.  Original Report Authenticated By: Tonia Ghent, M.D.   Chest Portable 1 View Post Insertion To Confirm Placement As Interpreted By Radiologist  03/22/2011  *RADIOLOGY REPORT*  Clinical Data: Bedside PICC placement.  PORTABLE CHEST - 1 VIEW 03/22/2011 2320 hours:  Comparison: Two-view chest x-ray 12/30/2010 [facility] [address] and 03/31/2010 [facility].  Findings: Right arm PICC looped upon itself in the innominate vein, with the tip back in the right subclavian vein.  Cardiac silhouette normal in size, unchanged.  Hilar and mediastinal contours unremarkable.  Consolidation in the lung bases, left greater than right, and probable bilateral pleural effusions.  IMPRESSION:  1.  Right arm PICC looped upon itself in the innominate vein, with its tip back in the right subclavian vein. 2.  Bibasilar pneumonia, left greater than right.  Probable bilateral pleural effusions.  Original Report Authenticated By: Arnell Sieving, M.D.   Dg Abd Portable 1v  03/17/2011  *RADIOLOGY REPORT*  Clinical Data: Incorrect sponge count during lumbar spinal surgery; evaluate for foreign body.  ABDOMEN - 1 VIEW  Comparison: Lumbar spine radiographs performed during surgery, the most recent of which was at 04:30 a.m.  Findings: No  radiopaque foreign bodies are seen to suggest retained sponge.  Evaluation is mildly suboptimal due to limitations in penetration.  Degenerative change is noted along the lumbar spine; right convex lumbar scoliosis is noted.  Mild sclerotic change is seen at the sacroiliac joints.  The visualized bowel gas pattern is grossly unremarkable.  IMPRESSION:  1.  No radiopaque foreign bodies seen to suggest a retained sponge. 2.  Degenerative change noted along the lumbar spine, with right convex lumbar scoliosis.  Original Report Authenticated By: Tonia Ghent, M.D.   Ir Fluoro Guide Cv Midline Picc Right  03/23/2011  *RADIOLOGY REPORT*  Clinical Data: Malpositioned right PICC line  FLUOROSCOPIC RIGHT PICC LINE REPOSITION  Date:  03/23/2011 09:45:00  Radiologist:  M. Ruel Favors, M.D.  Medications:  1% lidocaine locally  Guidance:  Fluoroscopic  Fluoroscopy time:  0.6 minutes  PROCEDURE/FINDINGS:  Informed consent was obtained from the patient following explanation of the procedure, risks, benefits and alternatives. The patient understands, agrees and consents for the procedure. All questions were addressed.  A time out was performed.  Maximal barrier sterile technique utilized including caps, mask, sterile gowns, sterile gloves, large sterile drape, hand hygiene, and Chloroprep  Under sterile conditions and local anesthesia, the existing malpositioned right PICC line was manipulated with a 0.035 glide wire and advanced into the SVC RA junction without difficulty. Position confirmed with fluoroscopy.  Blood aspirated easily followed by saline flushes.  Caps applied followed by sterile dressing and a retention suture.  Images obtained for documentation.  IMPRESSION: Fluoroscopic PICC repositioned with the tip now position in the SVC RA junction.  Ready for use.  Original Report Authenticated By: Judie Petit. Ruel Favors, M.D.     PERTINENT LAB RESULTS: CBC:  Basename 03/23/11 0630 03/21/11 0610  WBC 8.4 11.2*  HGB  9.8* 9.7*  HCT 29.4* 28.7*  PLT 285 280   CMET CMP     Component Value Date/Time   NA 137 03/23/2011 0630   K 3.6 03/23/2011 0630   CL 99 03/23/2011 0630   CO2 27 03/23/2011 0630   GLUCOSE 97 03/23/2011 0630   BUN 15 03/23/2011 0630   CREATININE 0.78 03/23/2011 0630   CALCIUM 9.2 03/23/2011 0630   PROT 6.6 03/23/2011 0630   ALBUMIN 2.4* 03/23/2011 0630   AST 93* 03/23/2011 0630   ALT 126* 03/23/2011 0630   ALKPHOS 59 03/23/2011 0630   BILITOT 0.4 03/23/2011 0630   GFRNONAA 84* 03/23/2011 0630   GFRAA >90 03/23/2011 0630    GFR Estimated Creatinine Clearance: 68.5 ml/min (by C-G formula based on Cr of 0.78). No results found for this basename: LIPASE:2,AMYLASE:2 in the last 72 hours  No results found for this basename: CKTOTAL:3,CKMB:3,CKMBINDEX:3,TROPONINI:3 in the last 72 hours No results found for this basename: POCBNP:3 in the last 72 hours No results found for this basename: DDIMER:2 in the last 72 hours No results found for this basename: HGBA1C:2 in the last 72 hours No results found for this basename: CHOL:2,HDL:2,LDLCALC:2,TRIG:2,CHOLHDL:2,LDLDIRECT:2 in the last 72 hours No results found for this basename: TSH,T4TOTAL,FREET3,T3FREE,THYROIDAB in the last 72 hours No results found for this basename: VITAMINB12:2,FOLATE:2,FERRITIN:2,TIBC:2,IRON:2,RETICCTPCT:2 in the last 72 hours Coags: No results found for this basename: PT:2,INR:2 in the last 72 hours Microbiology: Recent Results (from the past 240 hour(s))  FUNGUS CULTURE W SMEAR     Status: Normal (Preliminary result)   Collection Time   03/17/11  3:23 AM      Component Value Range Status Comment   Specimen Description ABSCESS BACK   Final    Special Requests     Final    Value: L2 3 LUMBAR EPIDURAL PATIENT ON FOLLOWING GENTAMYCIN   Fungal Smear NO YEAST OR FUNGAL ELEMENTS SEEN   Final    Culture CULTURE IN PROGRESS FOR FOUR WEEKS   Final    Report Status PENDING   Incomplete   GRAM STAIN     Status: Normal     Collection Time   03/17/11  3:23 AM      Component Value Range Status Comment   Specimen Description ABSCESS BACK   Final    Special Requests     Final    Value: L2 3 LUMBAR EPIDURAL PATIENT ON FOLLOWING GENTAMYCIN   Gram Stain     Final    Value: RARE WBC PRESENT,BOTH PMN AND MONONUCLEAR     NO ORGANISMS SEEN     CALLED TO E BETHEL, RN 03/17/11 0838 BY K SCHULTZ   Report Status 03/17/2011 FINAL   Final   CULTURE, ROUTINE-ABSCESS (INCLUDES ANAEROBIC)     Status: Normal   Collection Time   03/17/11  3:23 AM      Component Value Range Status Comment   Specimen Description ABSCESS BACK   Final    Special Requests     Final    Value: L2 3 LUMBAR EPIDURAL PATIENT ON FOLLOWING GENTAMYCIN   Gram Stain     Final    Value: FEW WBC PRESENT,BOTH PMN AND MONONUCLEAR     NO ORGANISMS SEEN   Culture NO GROWTH 3 DAYS   Final    Report Status 03/20/2011 FINAL   Final   ANAEROBIC CULTURE     Status: Normal   Collection Time   03/17/11  3:23 AM      Component Value Range Status Comment   Specimen Description ABSCESS BACK   Final    Special Requests     Final    Value: L2 3 LUMBAR EPIDURAL PATIENT ON FOLLOWING GENTAMYCIN   Gram Stain     Final    Value: RARE WBC PRESENT,BOTH PMN AND MONONUCLEAR     NO ORGANISMS SEEN     CALLED TO E BETHEL  RN 03/17/11 0838 BY K SCHULTZ Performed at [facility]   Culture NO ANAEROBES ISOLATED   Final    Report Status 03/22/2011 FINAL   Final   GRAM STAIN     Status: Normal   Collection Time   03/17/11  4:43 AM      Component Value Range Status Comment   Specimen Description ABSCESS BACK   Final    Special Requests     Final  Value: LUMBAR THREE AND FOUR EPIDURAL PATIENT ON FOLLOWING GENTAMYCIN   Gram Stain     Final    Value: FEW WBC PRESENT,BOTH PMN AND MONONUCLEAR     NO ORGANISMS SEEN     CALLED TO E BETHEL, RN 03/17/11 0838 BY K SCHULTZ   Report Status 03/17/2011 FINAL   Final   FUNGUS CULTURE W SMEAR     Status: Normal (Preliminary  result)   Collection Time   03/17/11  4:43 AM      Component Value Range Status Comment   Specimen Description ABSCESS BACK   Final    Special Requests     Final    Value: LUMBAR THREE AND FOUR EPIDURAL PATIENT ON FOLLOWING GENTAMYCIN   Fungal Smear NO YEAST OR FUNGAL ELEMENTS SEEN   Final    Culture CULTURE IN PROGRESS FOR FOUR WEEKS   Final    Report Status PENDING   Incomplete   CULTURE, ROUTINE-ABSCESS (INCLUDES ANAEROBIC)     Status: Normal   Collection Time   03/17/11  4:43 AM      Component Value Range Status Comment   Specimen Description ABSCESS BACK   Final    Special Requests     Final    Value: LUMBAR THREE AND FOUR EPIDURAL PATIENT ON FOLLOWING GENTAMYCIN   Gram Stain     Final    Value: FEW WBC PRESENT,BOTH PMN AND MONONUCLEAR     NO ORGANISMS SEEN   Culture NO GROWTH 3 DAYS   Final    Report Status 03/20/2011 FINAL   Final   ANAEROBIC CULTURE     Status: Normal   Collection Time   03/17/11  4:43 AM      Component Value Range Status Comment   Specimen Description ABSCESS BACK   Final    Special Requests     Final    Value: LUMBAR THREE AND FOUR EPIDURAL PATIENT ON FOLLOWING GENTAMYCIN   Gram Stain     Final    Value: FEW WBC PRESENT,BOTH PMN AND MONONUCLEAR     NO ORGANISMS SEEN     CALLED T E BETHAL RN 03/17/11 0838 BY K SCHULTZ Performed at [facility]   Culture NO ANAEROBES ISOLATED   Final    Report Status 03/22/2011 FINAL   Final   MRSA PCR SCREENING     Status: Normal   Collection Time   03/18/11  9:41 AM      Component Value Range Status Comment   MRSA by PCR NEGATIVE  NEGATIVE  Final      BRIEF HOSPITAL COURSE:   Principal Problem:  *Epidural abscess with discitis -Patient was seen in consult by Dr. Wynetta Emery from the neurosurgical service. -Patient underwent a multilevel decompression of the epidural abscess. -Patient was also seen in consult by Dr. Jerolyn Center from infectious disease, she recommended at least 6 weeks of combined ampicillin  and gentamicin for lumbar discitis and infective endocarditis. -Patient has had a PICC line placed, I have spoken with both Dr's Ilsa Iha and Wynetta Emery, patient has been cleared for discharge. -Since the patient will be on gentamicin, it is recommended that the patient have a gentamycin trough level and BMET drawn every 5 days at the skilled nursing facility. -Patient will need to see Dr. Deirdre Pippins the neurosurgical service at his clinic in the next 1 to 2 weeks. Skilled nursing facility will need to make followup appointments  Active Problems: Infective endocarditis with enterococcal bacteremia -Enterococcus is sensitive to ampicillin. -  A TEE showed a mitral valve vegetation measuring 1 cm x0.3 cm on the anterior leaflet. -Current recommendations are to continue with ampicillin and gentamicin for at least 6 weeks. -Gentamycin trough level and chemistries need to be checked every 5 days, we will defer this to the rounding physician at the skilled nursing facility. -Patient needs to followup with Dr. Jerolyn Center from infectious disease clinic in around 3-4 weeks. The skilled nursing facility should make necessary followup appointments.   History of TTP (thrombotic thrombocytopenic purpura) -Patient was seen by Dr. Cyndie Chime, he suggests that the patient continue to stay off Imuran for the time being.   COPD (chronic obstructive pulmonary disease)  -Stable.   TODAY-DAY OF DISCHARGE:  Subjective:   Arleta Ostrum today has no headache,no chest abdominal pain,no new weakness tingling or numbness, feels much better he is anxious to be discharged today. Objective:   Blood pressure 112/74, pulse 91, temperature 98.9 F (37.2 C), temperature source Oral, resp. rate 19, height 5\' 4"  (1.626 m), weight 79.2 kg (174 lb 9.7 oz), SpO2 90.00%.  Intake/Output Summary (Last 24 hours) at 03/23/11 1251 Last data filed at 03/23/11 0900  Gross per 24 hour  Intake    343 ml  Output    750 ml  Net   -407  ml    Exam Awake Alert, Oriented *3, No new F.N deficits, Normal affect [place].AT,PERRAL Supple Neck,No JVD, No cervical lymphadenopathy appriciated.  Symmetrical Chest wall movement, Good air movement bilaterally, CTAB RRR,No Gallops,Rubs or new Murmurs, No Parasternal Heave +ve B.Sounds, Abd Soft, Non tender, No organomegaly appriciated, No rebound -guarding or rigidity. No Cyanosis, Clubbing or edema, No new Rash or bruise  DISPOSITION: Skilled nursing facility  DISCHARGE INSTRUCTIONS:    Follow-up Information    Follow up with Judyann Munson, MD in 4 months.   Contact information:   [address] [place] [address] [phone]       Follow up with CRAM,GARY P in 2 weeks.      Follow up with MORRISEY,LEMONT in 2 weeks.   Contact information:   [facility] [address] [place] [address] [phone]         1. Will need a gentamycin trough levels and chemistry panel every 5 days while on antibiotic therapy.   Total Time spent on discharge equals 45 minutes.  SignedJeoffrey Massed 03/23/2011 12:51 PM

## 2011-03-23 NOTE — Progress Notes (Signed)
Pt, per rept, was admitted with altered level of consciousness and was found to have an epidural abscess. Pt has decreased ROM of back related to surgery. Pt is weak and deconditioned related to illness and surgery. Pt can transfer/ambulate with one assist and RW. Pt voids BSC with assist. Pt dressing to low back is dry and intact. Pt noted to have a negative neuro check except pt noted to be slightly weak of BLE. Pt is alert and oriented x 3 but forgetful at times. Pt not noted to be impulsive but bed alarm intact for safety. Pt repts last BM "a week" ago. Pt BS+x4. Pt denies nausea or vomiting and repts passing gas. Pt noted to be slightly distended. Will request order for laxative/stool softner from MD on AM rounds. Lungs CTA but noted to be diminished in the bases. Pt sats 90% RA. Pt denies cough. No s/sx resp distress and no c/o such. Pt, per rept, has a history of smoking and wears nicotine patch that was found on the bed upon assessment this AM. Will replace patch with 1000 AM medications.

## 2011-03-23 NOTE — Progress Notes (Signed)
Hematology progress note to provide background information on this complicated patient.  70 year old woman who first came to medical attention back in January of 2005 when she presented with neurologic changes as the first sign of TTP.  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 She was initially admitted to [facility] in [place]. She was stabilized with plasma exchange steroids and antiplatelet agents and then transferred to my care in [place]. She achieved an initial complete remission only to have a early relapse in August of 2006. She was again treated with plasma exchange and steroids. At that point Rituxan anti-B-cell antibody was added to consolidate her remission. She presented again in October of 2011 with an asymptomatic fall in her hemoglobin and platelets and again evidence for acute microangiopathic hemolytic anemia with thrombocytopenia. A vascular catheter was placed and she was again started on a combination of plasmapheresis and steroids. She received another course of Rituxan antibody. At that point I elected to put her on Imuran on a chronic basis to prevent further relapse. She was initially started on a dose of 50 mg daily. This was titrated up to a maximum dose of 150 mg daily by February of 2012.  We have been monitoring blood counts in our office closely. Most recent labs done 02/07/2011 with hemoglobin 15.8 platelet count 152,000 Rituxan 1.7% bilirubin 0.4 LDH 215.    She's had chronic back problems and we have deferred surgery due to frequent flareups of her TTP and need for a treatments as outlined above. She has been working with the orthopedic surgeon and a pain management specialist. She has been receiving periodic epidural steroid injections. Most recent injection given approximately July of this year.  The patient called our office about 2 weeks ago to report sudden onset of fever without any other associated symptoms. At the  time of her call she had early seen her primary care physician and he had obtained cultures. The next communication was from the family on 11/9  That she had been admitted to [facility].  Dr. Monte Fantasia WAR was kind enough to call me and discuss management.  The patient's platelet count had dipped to 92,000. There was no evidence for a hemolytic process at that point. Blood cultures were positive.  I felt most likely cause of the fall in  her platelets was related to bacterial sepsis. I discussed with the the physician potential sources of infection. The patient no longer has any invasive indwelling catheters. She had complained of increasing back pain recently and I suggested that it no other source of infection was found to check an MRI of the spine to look for an epidural abscess. I told her to stop the Imuran.  Initial cultures grew enterococcus. Treatment was started for enterococcal infection presumed urinary source. Although  she had an initial response to antibiotic therapy, she began to develop severe back pain and spasms of her lower extremities. She developed intermittent confusion. A CT scan of the brain was negative. A CT scan of the spine did show evidence for epidural abscess and she was transferred here on an urgent basis on the neurosurgery service. On 03/17/11 she underwent a decompressive lumbar laminectomy from L1-L4 with microdissection of the left L2-3 and 4 nerve roots and evacuation of a epidural abscess.  With treatment of her infection, her clinical situation has stabilized. Platelet count rapidly recovered to normal and has remained so for the entire hospital admission.  Infectious disease consultant recommended a transesophageal echocardiogram.  This was done yesterday and unfortunately shows a large vegetation on on the anterior leaflet of the mitral valve. There was a small atrial septal defect 5 mm. Patient is scheduled to have an MRI of the brain to make sure that she  didn't suffer any embolic phenomenon. She was confused initially with her sepsis and it took a while to clear. Initial noncontrast CT brain did not show any major changes.  She is currently stable at this time. She is afebrile. She is receiving ampicillin and gentamicin. She received Zosyn at the referring hospital  On exam she is alert and oriented pupils equal round reactive to light pharynx no erythema or exudate neck is supple lungs are clear and resonant to percussion regular cardiac rhythm no murmur abdomen is soft and nontender no mass no organomegaly extremities no edema no calf tenderness neurologic mental status intact cranial nerves grossly normal motor strength 5 over 5 reflexes 1+ symmetric.  Impression #1. 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  #3. Multiply relapsed TTP. No evidence for a flare up at this time. However, I will keep her off the Imuran at this time. Continue to monitor blood counts closely.  #4. Obstructive airway disease in an ongoing tobacco abuser.  #5. Advanced degenerative arthritis of the spine.

## 2011-03-23 NOTE — Progress Notes (Signed)
Clinical Social Worker received phone call from RN stating that pt has had bowel movement. Clinical Social Worker notified facility. Clinical Social Worker facilitated pt D/C needs including contacting facility, family, and arranging ambulance transport to [facility]	[place]. No further social work needs at this time.  Jacklynn Lewis, MSW, LCSWA  Clinical Social Work [phone]

## 2011-03-23 NOTE — Procedures (Signed)
Successful reposition of the RUE SL PICC over guidwire Tip now SVC / RA Ready to use No comp

## 2011-03-23 NOTE — Progress Notes (Signed)
Subjective: Patient reports Patient is doing well with minimal back pain and improved leg pain she denies any new numbness or tingling and he reports increasing strength  Objective: Vital signs in last 24 hours: Temp:  [98.7 F (37.1 C)-99.8 F (37.7 C)] 98.9 F (37.2 C) (11/21 0606) Pulse Rate:  [81-91] 91  (11/21 0606) Resp:  [18-20] 19  (11/21 0606) BP: (105-131)/(61-81) 112/74 mmHg (11/21 0606) SpO2:  [88 %-92 %] 90 % (11/21 0606)  Intake/Output from previous day: 11/20 0701 - 11/21 0700 In: 103 [I.V.:3; IV Piggyback:100] Out: 750 [Urine:750] Intake/Output this shift: Total I/O In: 240 [P.O.:240] Out: -   Patient is to have 5 out of 5 strength in her iliopsoas, quads, gastrocs, hamstrings, anterior tibialis, EHL. her wound is clean and dry. And I discontinued her bandage.  Lab Results:  [facility] 03/23/11 0630 03/21/11 0610  WBC 8.4 11.2*  HGB 9.8* 9.7*  HCT 29.4* 28.7*  PLT 285 280   BMET  Basename 03/23/11 0630 03/21/11 0610  NA 137 140  K 3.6 4.0  CL 99 100  CO2 27 31  GLUCOSE 97 106*  BUN 15 16  CREATININE 0.78 0.77  CALCIUM 9.2 9.4    Studies/Results: Mr Laqueta Jean ZO Contrast  03/23/2011  *RADIOLOGY REPORT*  Clinical Data: Rule out septic emboli or epidural abscess.  Sepsis  MRI HEAD WITHOUT AND WITH CONTRAST  Technique:  Multiplanar, multiecho pulse sequences of the brain and surrounding structures were obtained according to standard protocol without and with intravenous contrast  Contrast: 15mL MULTIHANCE GADOBENATE DIMEGLUMINE 529 MG/ML IV SOLN  Comparison: None.  Findings: Ventricle size is normal.  Age appropriate atrophy. Small white matter hyperintensities bilaterally, most compatible with chronic microvascular ischemia.  Negative for acute infarct.  Negative for mass or edema in the brain.  No fluid collection or abscess is present.  Postcontrast imaging reveals normal enhancement.  Negative for metastatic disease or epidural abscess.  No brain abscess  is identified.  Paranasal sinuses are clear.  Bilateral mastoid sinus effusion.  IMPRESSION: Mild chronic microvascular ischemia.  No acute intracranial abnormality.  Mastoid sinus effusion, left greater than right.  Original Report Authenticated By: Camelia Phenes, M.D.   Chest Portable 1 View Post Insertion To Confirm Placement As Interpreted By Radiologist  03/22/2011  *RADIOLOGY REPORT*  Clinical Data: Bedside PICC placement.  PORTABLE CHEST - 1 VIEW 03/22/2011 2320 hours:  Comparison: Two-view chest x-ray 12/30/2010 [facility] [address] and 03/31/2010 [facility].  Findings: Right arm PICC looped upon itself in the innominate vein, with the tip back in the right subclavian vein.  Cardiac silhouette normal in size, unchanged.  Hilar and mediastinal contours unremarkable.  Consolidation in the lung bases, left greater than right, and probable bilateral pleural effusions.  IMPRESSION:  1.  Right arm PICC looped upon itself in the innominate vein, with its tip back in the right subclavian vein. 2.  Bibasilar pneumonia, left greater than right.  Probable bilateral pleural effusions.  Original Report Authenticated By: Arnell Sieving, M.D.   Ir Fluoro Guide Cv Midline Picc Right  03/23/2011  *RADIOLOGY REPORT*  Clinical Data: Malpositioned right PICC line  FLUOROSCOPIC RIGHT PICC LINE REPOSITION  Date:  03/23/2011 09:45:00  Radiologist:  M. Ruel Favors, M.D.  Medications:  1% lidocaine locally  Guidance:  Fluoroscopic  Fluoroscopy time:  0.6 minutes  PROCEDURE/FINDINGS:  Informed consent was obtained from the patient following explanation of the procedure, risks, benefits and alternatives. The patient understands, agrees  and consents for the procedure. All questions were addressed.  A time out was performed.  Maximal barrier sterile technique utilized including caps, mask, sterile gowns, sterile gloves, large sterile drape, hand hygiene, and Chloroprep  Under sterile conditions and  local anesthesia, the existing malpositioned right PICC line was manipulated with a 0.035 glide wire and advanced into the SVC RA junction without difficulty. Position confirmed with fluoroscopy.  Blood aspirated easily followed by saline flushes.  Caps applied followed by sterile dressing and a retention suture.  Images obtained for documentation.  IMPRESSION: Fluoroscopic PICC repositioned with the tip now position in the SVC RA junction.  Ready for use.  Original Report Authenticated By: Judie Petit. Ruel Favors, M.D.    Assessment/Plan: Patient status post multilevel decompressive lumbar laminectomy for epidural abscess cultures of all been negative during this hospitalization. She'll be discharged [facility] further rehabilitation the ulna bacteria ever grown was enterococcus she does not have vegetations apparently eyes to treat her long-term IV antibiotics through PICC line I would like to see the patient back in approximately 2 weeks in followup and continued physical and occupational therapy.  LOS: 7 days     Brinly Maietta P 03/23/2011, 1:04 PM

## 2011-03-23 NOTE — Progress Notes (Signed)
PT session cancelled due to pt in procedure (interventional radiology).  Will attempt back later today if time permits.    [name], [place] [phone] 03/23/2011

## 2011-03-23 NOTE — Progress Notes (Signed)
Clinical Social Worker faxed D/C information to [facility]	[place] to facilitate pt D/C. Clinical Social Worker received phone call from [facility] stating that pt would not be able to be transferred until pt has a bowel movement. Clinical Social Worker notified MD and RN. Clinical Social Worker to facilitate pt D/C needs once pt has bowel movement.  Jacklynn Lewis, MSW, LCSWA  Clinical Social Work [phone]

## 2011-03-23 NOTE — Progress Notes (Signed)
Clinical Social Worker received phone call from [facility]	[place] stating that pt family would like Administrator for SNF at D/C and bed available today if pt medically ready. Clinical Social Worker spoke with pt daughter, Tamela Oddi who confirmed bed choice at [place]. Per MD, pt medically ready today. Clinical Social Worker to facilitate pt D/C needs.  Jacklynn Lewis, MSW, LCSWA  Clinical Social Work [phone]

## 2011-03-25 ENCOUNTER — Encounter (HOSPITAL_COMMUNITY): Payer: Self-pay | Admitting: Cardiology

## 2011-03-25 NOTE — Brief Op Note (Signed)
03/16/2011 - 03/23/2011  5:12 PM  PATIENT:  [name]  70 y.o. female  PRE-OPERATIVE DIAGNOSIS:  Sepsis and endocarditis  POST-OPERATIVE DIAGNOSIS:  ASD, mitral valve vegetation  PROCEDURE:  Procedure(s): TRANSESOPHAGEAL ECHOCARDIOGRAM (TEE)  SURGEON:  Surgeon(s): Pamella Pert  PHYSICIAN ASSISTANT:   ASSISTANTS: none   ANESTHESIA:   local and IV sedation  EBL:     BLOOD ADMINISTERED:none  DRAINS: none   LOCAL MEDICATIONS USED:  XYLOCAINE spray  SPECIMEN:    DISPOSITION OF SPECIMEN:  N/A  COUNTS:  NO NA  TOURNIQUET:    DICTATION: .Other Dictation: Dictation Number [id number] confirmed on the E chart  PLAN OF CARE: Patient is inpatient  PATIENT DISPOSITION:  Patient is in patient   Delay start of Pharmacological VTE agent (>24hrs) due to surgical blood loss or risk of bleeding:  {YES/NO/NOT APPLICABLE:20182

## 2011-04-02 ENCOUNTER — Encounter: Payer: Self-pay | Admitting: Internal Medicine

## 2011-04-04 ENCOUNTER — Other Ambulatory Visit: Payer: Medicare Other | Admitting: Lab

## 2011-04-06 ENCOUNTER — Other Ambulatory Visit: Payer: Self-pay | Admitting: Oncology

## 2011-04-06 ENCOUNTER — Telehealth: Payer: Self-pay | Admitting: *Deleted

## 2011-04-06 ENCOUNTER — Other Ambulatory Visit (HOSPITAL_BASED_OUTPATIENT_CLINIC_OR_DEPARTMENT_OTHER): Payer: Medicare Other | Admitting: Lab

## 2011-04-06 DIAGNOSIS — B370 Candidal stomatitis: Secondary | ICD-10-CM

## 2011-04-06 DIAGNOSIS — M311 Thrombotic microangiopathy: Secondary | ICD-10-CM

## 2011-04-06 DIAGNOSIS — D6949 Other primary thrombocytopenia: Secondary | ICD-10-CM

## 2011-04-06 DIAGNOSIS — M479 Spondylosis, unspecified: Secondary | ICD-10-CM

## 2011-04-06 LAB — COMPREHENSIVE METABOLIC PANEL
ALT: 17 U/L (ref 0–35)
AST: 19 U/L (ref 0–37)
Creatinine, Ser: 1.46 mg/dL — ABNORMAL HIGH (ref 0.50–1.10)
Total Bilirubin: 0.3 mg/dL (ref 0.3–1.2)

## 2011-04-06 LAB — CBC & DIFF AND RETIC
BASO%: 0.4 % (ref 0.0–2.0)
EOS%: 6.2 % (ref 0.0–7.0)
HCT: 31.3 % — ABNORMAL LOW (ref 34.8–46.6)
LYMPH%: 21.1 % (ref 14.0–49.7)
MCH: 30.4 pg (ref 25.1–34.0)
MCHC: 33.5 g/dL (ref 31.5–36.0)
NEUT%: 64.9 % (ref 38.4–76.8)
Platelets: 167 10*3/uL (ref 145–400)

## 2011-04-06 NOTE — Telephone Encounter (Signed)
Spoke to daughter & reported platelet count 167,000 per Dr. Cyndie Chime.

## 2011-04-06 NOTE — Telephone Encounter (Signed)
Message copied by Sabino Snipes on Wed Apr 06, 2011  4:57 PM ------      Message from: Levert Feinstein      Created: Wed Apr 06, 2011  3:23 PM       Call patient platelets holding @ 167,000

## 2011-04-07 ENCOUNTER — Other Ambulatory Visit: Payer: Self-pay | Admitting: *Deleted

## 2011-04-07 DIAGNOSIS — Z862 Personal history of diseases of the blood and blood-forming organs and certain disorders involving the immune mechanism: Secondary | ICD-10-CM

## 2011-04-08 ENCOUNTER — Telehealth: Payer: Self-pay | Admitting: Oncology

## 2011-04-08 NOTE — Telephone Encounter (Signed)
Called pt ,left message for lab draw 12/19

## 2011-04-15 LAB — FUNGUS CULTURE W SMEAR: Fungal Smear: NONE SEEN

## 2011-04-17 ENCOUNTER — Other Ambulatory Visit: Payer: Self-pay | Admitting: Neurosurgery

## 2011-04-19 ENCOUNTER — Encounter: Payer: Self-pay | Admitting: Internal Medicine

## 2011-04-19 ENCOUNTER — Ambulatory Visit (INDEPENDENT_AMBULATORY_CARE_PROVIDER_SITE_OTHER): Payer: Medicare Other | Admitting: Internal Medicine

## 2011-04-19 DIAGNOSIS — G062 Extradural and subdural abscess, unspecified: Secondary | ICD-10-CM

## 2011-04-19 DIAGNOSIS — A409 Streptococcal sepsis, unspecified: Secondary | ICD-10-CM

## 2011-04-19 DIAGNOSIS — I38 Endocarditis, valve unspecified: Secondary | ICD-10-CM

## 2011-04-19 DIAGNOSIS — A4181 Sepsis due to Enterococcus: Secondary | ICD-10-CM

## 2011-04-19 DIAGNOSIS — A419 Sepsis, unspecified organism: Secondary | ICD-10-CM

## 2011-04-20 ENCOUNTER — Other Ambulatory Visit: Payer: Medicare Other | Admitting: Lab

## 2011-04-20 NOTE — Progress Notes (Signed)
INFECTIOUS DISEASES INITIAL CLINIC VISIT  RFV: follow up to hospitalization, enterococcal epidural abscess, bacteremia, endocarditis in immunocompromised patient  Subjective:    Patient ID: [name], female    DOB: 1941/03/26, 70 y.o.   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 She was seen by her PCP who was working her up for infection, possible UTI, but subsequently felt so ill, presented to ED on . She was found to have sepsis secondarily to enterococcal bacteremia. She had complained of increasing back pain thus an MRI of the spine was done which found an epidural abscess.Treatment was started for enterococcal infection with ampicillin and gentamicin. Her infection was presumed urinary source. She was transferred to the [facility] neurosurgery service on 03/17/11  In order to undergo a decompressive lumbar laminectomy from L1-L4 with microdissection of the left L2-3 and 4 nerve roots and evacuation of a epidural abscess. She was seen by the ID consultation service, and due to her bacteremia, new murmur on physical exam, and a few splinter hemorrhages, a transesophageal  Was recommended despite TTE being negative. The TTE revealed a large vegetation on on the anterior leaflet of the mitral valve. There was a small atrial septal defect 5 mm. Due to her altered mental status a  MRI of the brain was also done to look for any embolic phenomenon, which was negative. With each day of hospitalization, her mental status improved getting closer to her baseline. Her blood cultures and epidural abscess cultures remain no growth to date for both aerobic and fungal work-up. She did have history of epidural steroid injection in July, but not at a facility that was linked to fungal meningitis  outbreak.  Since being discharged from the hospital, she spent the first 3 weeks at a SNF/rehab center to gain back strength. She has been home for the last week and feeling well. "back to normal". No fevers, chills, good appetite, normal energy, no lower extremity weakness, no diarrhea, no rash. No decreased hearing, no ataxia.  Active Ambulatory Problems    Diagnosis Date Noted  . 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 Sepsis 03/16/2011  . History of TTP (thrombotic thrombocytopenic purpura) 03/16/2011  . COPD (chronic obstructive pulmonary disease) 03/16/2011   Resolved Ambulatory Problems    Diagnosis Date Noted  . No Resolved Ambulatory Problems   Past Medical History  Diagnosis Date  . Septicemia 03/2011  . Normal echocardiogram 03/15/11  . Acute delirium   . Bacteremia 03/2011  . Blood transfusion   . Anemia   . Arthritis   . Chronic back pain   . Spinal stenosis, lumbar   . Blood dyscrasia   . Depression    Allergies  Allergen Reactions  . Other     Wool: Reaction is hives Advertising account executive tape: Reaction is blistering  . Sulfa Antibiotics Hives  . Sulfa Drugs Cross Reactors      Current Outpatient Prescriptions  Medication Sig Dispense Refill  . albuterol (PROVENTIL HFA;VENTOLIN HFA) 108 (90 BASE) MCG/ACT inhaler Inhale 2 puffs into the lungs every 6 (six) hours as needed. For shortness of breath       . albuterol (PROVENTIL) (2.5 MG/3ML) 0.083% nebulizer solution Take 2.5 mg by nebulization every 6 (six) hours as needed. For wheezing       .  ALPRAZolam (XANAX) 0.25 MG tablet Take 1 tablet (0.25 mg total) by mouth 3 (three) times daily as needed for anxiety. For pain and anxiety  15 tablet  0  . cyclobenzaprine (FLEXERIL) 10 MG tablet Take 10 mg by mouth 3 (three) times daily as needed. For pain/muscle spasms       . FLUoxetine (PROZAC) 20 MG capsule Take 20 mg by mouth daily.        Marland Kitchen gabapentin (NEURONTIN) 400 MG capsule Take 1 capsule (400 mg total) by  mouth 3 (three) times daily.      Marland Kitchen gentamicin (GARAMYCIN) 1.2-0.9 MG/ML-% Inject 50 mLs (60 mg total) into the vein every 12 (twelve) hours.  50 mL    . meloxicam (MOBIC) 7.5 MG tablet Take 7.5 mg by mouth daily.        Marland Kitchen oxyCODONE-acetaminophen (PERCOCET) 7.5-325 MG per tablet Take 1 tablet by mouth every 4 (four) hours as needed. For pain  20 tablet  0  . rosuvastatin (CRESTOR) 20 MG tablet Take 20 mg by mouth daily.        . rosuvastatin (CRESTOR) 20 MG tablet Take 1 tablet (20 mg total) by mouth daily at 6 PM.      . senna (SENOKOT) 8.6 MG TABS Take 2 tablets (17.2 mg total) by mouth 2 (two) times daily.  120 each    . sodium chloride 0.9 % SOLN 50 mL with ampicillin 2 G SOLR 2 g Inject 2 g into the vein every 4 (four) hours.      Marland Kitchen tiotropium (SPIRIVA) 18 MCG inhalation capsule Place 18 mcg into inhaler and inhale daily.           Review of Systems  Constitutional: Negative for fever, chills, diaphoresis, activity change, appetite change, fatigue and unexpected weight change.  HENT: Negative for congestion, sore throat, rhinorrhea, sneezing, trouble swallowing and sinus pressure. Patient feels like her teeth are tingling/spongy? Since being on antibiotics Eyes: Negative for photophobia and visual disturbance.  Respiratory: Negative for cough, chest tightness, shortness of breath, wheezing and stridor.  Cardiovascular: Negative for chest pain, palpitations and leg swelling.  Gastrointestinal: Negative for nausea, vomiting, abdominal pain, diarrhea, constipation, blood in stool, abdominal distention and anal bleeding.  Genitourinary: Negative for dysuria, hematuria, flank pain and difficulty urinating.  Musculoskeletal: Negative for myalgias, back pain, joint swelling, arthralgias and gait problem.  Skin: Negative for color change, pallor, rash and wound.  Neurological: Negative for dizziness, tremors, weakness and light-headedness.  Hematological: Negative for adenopathy. Does not  bruise/bleed easily.  Psychiatric/Behavioral: Negative for behavioral problems, confusion, sleep disturbance, dysphoric mood, decreased concentration and agitation.       Objective:   Physical Exam BP 152/83  Pulse 106  Temp(Src) 98.1 F (36.7 C) (Oral)  Wt 165 lb (74.844 kg)  General Appearance:    Alert, cooperative, no distress, appears stated age  Head:    Normocephalic, without obvious abnormality, atraumatic  Eyes:    PERRL, conjunctiva/corneas clear, EOM's intact, fundi    benign, both eyes  Ears:    Normal TM's and external ear canals, both ears  Nose:   Nares normal, septum midline, mucosa normal, no drainage    or sinus tenderness  Throat:   Lips, mucosa, and tongue normal; teeth and gums normal  Neck:   Supple, symmetrical, trachea midline, no adenopathy;    thyroid:  no enlargement/tenderness/nodules; no carotid   bruit or JVD     Lungs:     Clear to auscultation bilaterally,  respirations unlabored      Heart:    Regular rate and rhythm, S1 and S2 normal, no murmur, rub   or gallop     Abdomen:     Soft, non-tender, bowel sounds active all four quadrants,    no masses, no organomegaly        Extremities:   Extremities normal, atraumatic, no cyanosis or edema  Pulses:   2+ and symmetric all extremities  Skin:   No rash. Right Arm PICC C/d/i  Lymph nodes:   Cervical, supraclavicular, and axillary nodes normal  Neurologic:   CNII-XII intact, normal strength, sensation and +2 patellar reflex   Outside labS: 12/16 gent trough 1.2 cr 1.5, gent peak 6.0  MICRO: 11/15 aerobic, fungal cultures NGTD      Assessment & Plan:  70 yo Female with 2 wk history of fever and back pain found to have enterococcal bacteremia c/b epidural abscess and native valve endocarditis. Currently on 4 of 6 wks of ampicillin plus gentamicin. Labs reveal AKI.  1) enterococcal disseminated infection = continue with ampicillin and gentamicin, but will coordinate with home health/pharmacy  to decrease her dose. She will need 2 more weeks of therapy. Through 12/29  2) therapeutic drug monitoring of gentamicin = the patient's baseline Cr is 0.8, now increased to 1.5 with gent trough 1.2. Will decrease gent dose in order to have trough < 1.  3) AG side effects = patient denies any tinnitus, ataxia, or decrease hearing  Will have patient come back to clinic in [facility] January for follow up visit, repeat labs to ensure Cr function returns back to baseline.  Greater than 60 minutes spent with patient, in interviewing and 50% coordinating her care.

## 2011-04-30 ENCOUNTER — Other Ambulatory Visit: Payer: Self-pay

## 2011-05-05 ENCOUNTER — Encounter: Payer: Self-pay | Admitting: Internal Medicine

## 2011-05-05 ENCOUNTER — Ambulatory Visit (INDEPENDENT_AMBULATORY_CARE_PROVIDER_SITE_OTHER): Payer: Medicare Other | Admitting: Internal Medicine

## 2011-05-05 VITALS — BP 131/78 | HR 94 | Temp 98.3°F | Ht 64.0 in | Wt 166.0 lb

## 2011-05-05 DIAGNOSIS — H838X9 Other specified diseases of inner ear, unspecified ear: Secondary | ICD-10-CM

## 2011-05-05 DIAGNOSIS — H818X9 Other disorders of vestibular function, unspecified ear: Secondary | ICD-10-CM

## 2011-05-05 DIAGNOSIS — B952 Enterococcus as the cause of diseases classified elsewhere: Secondary | ICD-10-CM

## 2011-05-05 DIAGNOSIS — A491 Streptococcal infection, unspecified site: Secondary | ICD-10-CM

## 2011-05-05 LAB — BASIC METABOLIC PANEL
BUN: 26 mg/dL — ABNORMAL HIGH (ref 6–23)
CO2: 24 mEq/L (ref 19–32)
Glucose, Bld: 110 mg/dL — ABNORMAL HIGH (ref 70–99)
Potassium: 4.1 mEq/L (ref 3.5–5.3)

## 2011-05-05 LAB — CBC WITH DIFFERENTIAL/PLATELET
Basophils Relative: 1 % (ref 0–1)
HCT: 35.1 % — ABNORMAL LOW (ref 36.0–46.0)
Hemoglobin: 11.6 g/dL — ABNORMAL LOW (ref 12.0–15.0)
MCH: 30.3 pg (ref 26.0–34.0)
MCHC: 33 g/dL (ref 30.0–36.0)
Monocytes Absolute: 0.3 10*3/uL (ref 0.1–1.0)
Monocytes Relative: 7 % (ref 3–12)
Neutro Abs: 2.2 10*3/uL (ref 1.7–7.7)

## 2011-05-05 NOTE — Progress Notes (Signed)
INFECTIOUS DISEASES HOSPITAL FOLLOW UP NOTE  RFV: completion of antibiotics for enterococcal bacteremia/endocarditis/epidural abscess; and new gait instability  Subjective:    Patient ID: [name], female    DOB: June 05, 1940, 71 y.o.   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 She was found to have sepsis secondarily to enterococcal bacteremia. She had complained of increasing back pain thus an MRI of the spine was done which found an epidural abscess.Treatment was started for enterococcal infection with ampicillin and gentamicin. Her infection was presumed urinary source. She was transferred to the [facility] cone neurosurgery service on 03/16/11 to undergo a decompressive lumbar laminectomy from L1-L4 with microdissection of the left L2-3 and 4 nerve roots and evacuation of a epidural abscess . On consultation for her enterococcal bacteremia, she had evidence of peripheral stigmata for endocarditis with splinter hemorrhage and new murmur. Thus we recommended a TEE to be done to evaluate for endocarditis, despite TTE showing no new murmurs or vegetations. The TEE did revealed a vegetation on the anterior leaflet of the mitral valve. MRI of brain did not show an embolic phenomenon.  She was discharged on 03/23/11, initially to a nursing home/rehab for 3 wks to regain strength and receive IV antibiotics, of ampicillin and gentamicin for 6 wks of therapy.  On her first outpatient follow up visit with [place] at the ID clinic on 04/19/11, she had been on IV abtx for 4 wks where she was feeling back to normal. No fevers,chills, good appetite, normal energy, no lower extremity weakness, no diarrhea, no rash, no decreased hearing, no ataxia. Her labs at the time did reveal that she had slight evidence of AKI  with Cr of 1.5 up from her baseline of 0.8.,with gentamicin trough slightly elevated at 1.2. Her gentamicin dosing was re-adjusted by home health according to protocol and she finished her last dose of gentamicin 12/28 and ampicillin on 12/29.   The patient has been doing lots of work for her family celebration, feeling slightly fatigued. She has now noticed that she has some instability while walking, especially with turning, "feeling dizzy". These symptoms have started roughly 4-5 days ago, approx 12/29. She has not fallen, however she does use a cane.  Active Ambulatory Problems    Diagnosis Date Noted  . 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 Sepsis 03/16/2011  . History of TTP (thrombotic thrombocytopenic purpura) 03/16/2011  . COPD (chronic obstructive pulmonary disease) 03/16/2011   Resolved Ambulatory Problems    Diagnosis Date Noted  . No Resolved Ambulatory Problems   Past Medical History  Diagnosis Date  . Septicemia 03/2011  . Normal echocardiogram 03/15/11  . Acute delirium   . Bacteremia 03/2011  . Blood transfusion   . Anemia   . Arthritis   . Chronic back pain   . Spinal stenosis, lumbar   . Blood dyscrasia   . Depression     Review of Systems  Constitutional: Negative for fever, chills, diaphoresis, activity change, appetite change, fatigue and unexpected weight change.  HENT: Negative for congestion, sore throat, rhinorrhea, sneezing, trouble swallowing and sinus pressure.  Eyes: Negative for photophobia and visual disturbance.  Respiratory: Negative for cough, chest tightness, shortness of breath, wheezing and stridor.  Cardiovascular: Negative for chest pain, palpitations and leg swelling.  Gastrointestinal: Negative for nausea, vomiting, abdominal pain,  diarrhea, constipation, blood in stool, abdominal distention and anal bleeding.  Genitourinary: Negative for dysuria, hematuria, flank pain and difficulty urinating.  Musculoskeletal: Negative for myalgias, back  pain, joint swelling, arthralgias and gait problem.  Skin: Negative for color change, pallor, rash and wound.  Neurological: POSITIVE FOR dizziness and unsteady gait. But no tremors, weakness and light-headedness.  Hematological: Negative for adenopathy. Does not bruise/bleed easily.  Psychiatric/Behavioral: Negative for behavioral problems, confusion, sleep disturbance, dysphoric mood, decreased concentration and agitation.       Objective:   Physical Exam  Constitutional: She is oriented to person, place, and time. She appears well-developed. No distress.  HENT:  Head: Normocephalic and atraumatic.  Right Ear: External ear normal.  Left Ear: External ear normal.  Mouth/Throat: No oropharyngeal exudate.  Eyes: Conjunctivae are normal. Pupils are equal, round, and reactive to light. Right eye exhibits no discharge. Left eye exhibits no discharge. No scleral icterus.  Neck: Normal range of motion. Neck supple. No JVD present.  Cardiovascular: Normal rate, regular rhythm, normal heart sounds and intact distal pulses.  Exam reveals no gallop and no friction rub.   No murmur heard. Pulmonary/Chest: Effort normal and breath sounds normal. No respiratory distress. She has no wheezes. She has no rales.  Abdominal: Soft. Bowel sounds are normal. She exhibits no distension. There is no tenderness. There is no rebound.  Musculoskeletal: She exhibits no edema and no tenderness.  Lymphadenopathy:    She has no cervical adenopathy.  Neurological: She is alert and oriented to person, place, and time. She displays normal reflexes. No cranial nerve deficit or sensory deficit. She exhibits normal muscle tone. Gait abnormal. Coordination normal.       No nystagmus; no abnormalities noted with finger to nose, rapid alternating hand movement, heel to shin, bilaterally. Negative rhomberg sign.   When patient ambulates she steps out of her gait when turning, due to dizziness  Skin: Skin is warm and dry. She  is not diaphoretic. No erythema.  Psychiatric: She has a normal mood and affect.   Right arm: picc line site is clean, dry, intact. Some mild contact dermatitis distal to antecubital fossa from recent tape.  Labs: Lab Results  Component Value Date   WBC 4.9 05/05/2011   HGB 11.6* 05/05/2011   HCT 35.1* 05/05/2011   MCV 91.6 05/05/2011   PLT 141* 05/05/2011   Erythrocyte Sedimentation Rate  No results found for this basename: esrsedrate   C-Reactive Protein  No results found for this basename: crp   Outside labS:  12/16 gent trough 1.2 cr 1.5, gent peak 6.0   MICRO:  11/15 aerobic, fungal cultures NGTD      Assessment & Plan:  Enterococcal endocarditis and epidural abscess = patient has finished appropriate course of therapy of 6 wks for endocarditis--43 days of antibiotics since 11/15, when bacteremia was cleared. We will discontinue her picc line today. We will check CBC with diff, ESR and CRP today.  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 She does not appear to have any hearing loss complaints. At this time, we will have vestibular testing and ENT evaluation arranged by [facility] group. There are reports that the vestibular toxicity may not be permanent and hope to see improvement over the next few weeks.  Gave precautionary guidelines = always use cane, do not make sudden turning movements. If she feels dizzy to sit down. If it worsens,  to give [place] a call  We will refer the patient to cardiology as follow up to her endocarditis, and defer to them to see if she needs a repeat TTE for baseline after being treated for endocarditis.   We will have patient come back in 2-3 wk to reassess her symptoms.

## 2011-05-05 NOTE — Progress Notes (Signed)
Lab draw from PICC: Pt. identified w/ name and DOB. Donned gloves. Clean tubing/hub connection cap with CHG wipe for 20 seconds, using scrubbing motion. Attached empty, sterile 10 cc syringe, opened clamp, withdrew 10 cc of waste and set aside. Attached next syringe and withrew 15 cc of blood, transferred to lab tubes. Order to remove PICC line obtained from Dr. Drue Second. Pt. identified with name and date of birth. PICC dressing removed, site unremarkable.  Sutures removed. PICC line removed using sterile procedure @ 1100. PICC length equal to that noted in pt's hospital chart of 39 cm. Sterile petroleum gauze + sterile 4X4 applied to PICC site, pressure applied for 10 minutes and covered with Medipore tape as a pressure dressing. Pt. instructed to limit use of arm for 1 hour. Pt. instructed that the pressure dressing should remain in place for 24 hours. Pt. verablized understanding of these instructions.

## 2011-05-06 ENCOUNTER — Telehealth: Payer: Self-pay | Admitting: *Deleted

## 2011-05-06 NOTE — Telephone Encounter (Signed)
Called the patient and advised her of the appointment and gave her address, time and phone number.

## 2011-05-06 NOTE — Telephone Encounter (Signed)
Thayer Ohm the scheduler with [facility] called to schedule an appointment for the patient. She gave me Tuesday 05/10/11 at 220 with Dr Emeline Darling, but patient needs to be there by 200 for paperwork. I will call the patient and give her this information.

## 2011-05-11 ENCOUNTER — Telehealth: Payer: Self-pay | Admitting: Internal Medicine

## 2011-05-11 ENCOUNTER — Telehealth: Payer: Self-pay | Admitting: *Deleted

## 2011-05-11 NOTE — Telephone Encounter (Signed)
Patient daughter Glennon Mac called and advised that her mother has been worse. That on Saturday and Sunday the patient was in bed because she was dizzy and could not walk without fear of falling. Advised the ENT does not think it is tinnitus and advised her to call this office for further direction. Advised the daughter will call provider to let her know whats going on and either I would call her back or the provider would call her back but she would hear from [place] before the end of the day. She advise to call her back on [phone].  Spoke with Dr Drue Second and she advised she will call the daughter and talk to them today. She also wanted me to call the ENT to get notes on the visit so she can review.  [facility] at [phone] and left message for medical records to call me back to fax the information of this patient.

## 2011-05-11 NOTE — Telephone Encounter (Signed)
Seen by ENT MD 05/10/11.  Rxes PT and meclizine.  Daughter stated that she would call Dr. Lind Covert office about the PT.  Daughter would appreciate a call from Dr. Drue Second to discuss problem.  Cell phone # [phone].

## 2011-05-11 NOTE — Telephone Encounter (Signed)
Was able to call back and get the information faxed to our office.

## 2011-05-11 NOTE — Telephone Encounter (Signed)
Patient's daughter called the clinic mentioning that her mother has had periodic instable gait, no falls. She was seen by ENT who felt no vestibular insult from gentamicin and that her imbalance is related to deconditioning, he recommended PT vestibular rehab. I have conveyed these results to the patient. I have recommended that they see her surgeon to determine when it is ok for her to participate in PT.

## 2011-05-14 ENCOUNTER — Telehealth: Payer: Self-pay | Admitting: Oncology

## 2011-05-14 NOTE — Telephone Encounter (Signed)
Talked to pt, gave her appt date for 2/15 , lab and ML visit

## 2011-05-16 ENCOUNTER — Other Ambulatory Visit: Payer: Self-pay | Admitting: Oncology

## 2011-05-16 DIAGNOSIS — Z862 Personal history of diseases of the blood and blood-forming organs and certain disorders involving the immune mechanism: Secondary | ICD-10-CM

## 2011-05-16 DIAGNOSIS — A419 Sepsis, unspecified organism: Secondary | ICD-10-CM

## 2011-05-16 DIAGNOSIS — G062 Extradural and subdural abscess, unspecified: Secondary | ICD-10-CM

## 2011-05-17 ENCOUNTER — Other Ambulatory Visit (HOSPITAL_BASED_OUTPATIENT_CLINIC_OR_DEPARTMENT_OTHER): Payer: Medicare Other | Admitting: Lab

## 2011-05-17 ENCOUNTER — Other Ambulatory Visit: Payer: Self-pay | Admitting: Oncology

## 2011-05-17 DIAGNOSIS — Z862 Personal history of diseases of the blood and blood-forming organs and certain disorders involving the immune mechanism: Secondary | ICD-10-CM

## 2011-05-17 DIAGNOSIS — G062 Extradural and subdural abscess, unspecified: Secondary | ICD-10-CM

## 2011-05-17 DIAGNOSIS — A419 Sepsis, unspecified organism: Secondary | ICD-10-CM

## 2011-05-17 LAB — LACTATE DEHYDROGENASE: LDH: 239 U/L (ref 94–250)

## 2011-05-17 LAB — CBC & DIFF AND RETIC
BASO%: 0.9 % (ref 0.0–2.0)
EOS%: 6.3 % (ref 0.0–7.0)
LYMPH%: 43.4 % (ref 14.0–49.7)
MCH: 29.9 pg (ref 25.1–34.0)
MCHC: 33.9 g/dL (ref 31.5–36.0)
MONO#: 0.5 10*3/uL (ref 0.1–0.9)
RBC: 4.32 10*6/uL (ref 3.70–5.45)
Retic %: 1.7 % (ref 0.70–2.10)
WBC: 5.8 10*3/uL (ref 3.9–10.3)
lymph#: 2.5 10*3/uL (ref 0.9–3.3)

## 2011-05-17 LAB — COMPREHENSIVE METABOLIC PANEL
Alkaline Phosphatase: 68 U/L (ref 39–117)
CO2: 27 mEq/L (ref 19–32)
Creatinine, Ser: 1.26 mg/dL — ABNORMAL HIGH (ref 0.50–1.10)
Glucose, Bld: 84 mg/dL (ref 70–99)
Total Bilirubin: 0.4 mg/dL (ref 0.3–1.2)

## 2011-05-17 LAB — TECHNOLOGIST REVIEW

## 2011-05-17 LAB — CHCC SMEAR

## 2011-05-18 ENCOUNTER — Telehealth: Payer: Self-pay | Admitting: *Deleted

## 2011-05-18 ENCOUNTER — Encounter: Payer: Self-pay | Admitting: Neurosurgery

## 2011-05-18 ENCOUNTER — Encounter: Payer: Self-pay | Admitting: Internal Medicine

## 2011-05-18 NOTE — Telephone Encounter (Signed)
Message left earlier for return call & pt's husband called back.  He was informed that platelets were 135k on the machine but Dr. Cyndie Chime looked at the smear & platelets appear higher than machine.  "No Worries" per Dr.Granfortuna.  The labs for 05/17/11 were routed electronically to Dr. Thana Ates & Dr. Jerolyn Center.

## 2011-05-19 ENCOUNTER — Telehealth: Payer: Self-pay | Admitting: *Deleted

## 2011-05-19 NOTE — Telephone Encounter (Signed)
Patient's spouse called requesting a return call.  Patient's PCP has prescribed Lavaza due to her triglycerides = 375.  Would like to know if it is okay for her to take this.  This acts as a blood thinner and with her TTP need to know how to proceed at this time.  Will notify MD.

## 2011-05-24 ENCOUNTER — Telehealth: Payer: Self-pay

## 2011-05-24 NOTE — Telephone Encounter (Signed)
Messages left on home answering machine by Lendell Caprice, RN on 1/18 and myself on 1/21 & 1/22 to return call re: concerns with Lavaza.  Per Dr Cyndie Chime - "should be OK" to take Lavaza. Repeat CBC in 1 week after beginning med.   Awaiting return call from pt. dph

## 2011-05-31 ENCOUNTER — Ambulatory Visit: Payer: Medicare Other | Admitting: Internal Medicine

## 2011-06-03 ENCOUNTER — Encounter: Payer: Self-pay | Admitting: Neurosurgery

## 2011-06-09 ENCOUNTER — Encounter: Payer: Self-pay | Admitting: Internal Medicine

## 2011-06-09 ENCOUNTER — Ambulatory Visit (INDEPENDENT_AMBULATORY_CARE_PROVIDER_SITE_OTHER): Payer: Medicare Other | Admitting: Internal Medicine

## 2011-06-09 VITALS — BP 112/70 | HR 118 | Temp 98.3°F | Wt 169.0 lb

## 2011-06-09 DIAGNOSIS — R269 Unspecified abnormalities of gait and mobility: Secondary | ICD-10-CM

## 2011-06-09 DIAGNOSIS — A419 Sepsis, unspecified organism: Secondary | ICD-10-CM

## 2011-06-09 DIAGNOSIS — A491 Streptococcal infection, unspecified site: Secondary | ICD-10-CM

## 2011-06-09 DIAGNOSIS — B952 Enterococcus as the cause of diseases classified elsewhere: Secondary | ICD-10-CM

## 2011-06-09 DIAGNOSIS — A409 Streptococcal sepsis, unspecified: Secondary | ICD-10-CM

## 2011-06-09 DIAGNOSIS — R2681 Unsteadiness on feet: Secondary | ICD-10-CM

## 2011-06-09 DIAGNOSIS — A4181 Sepsis due to Enterococcus: Secondary | ICD-10-CM

## 2011-06-09 DIAGNOSIS — I330 Acute and subacute infective endocarditis: Secondary | ICD-10-CM

## 2011-06-13 DIAGNOSIS — I330 Acute and subacute infective endocarditis: Secondary | ICD-10-CM | POA: Insufficient documentation

## 2011-06-13 DIAGNOSIS — R2681 Unsteadiness on feet: Secondary | ICD-10-CM | POA: Insufficient documentation

## 2011-06-13 DIAGNOSIS — A4181 Sepsis due to Enterococcus: Secondary | ICD-10-CM | POA: Insufficient documentation

## 2011-06-13 NOTE — Progress Notes (Signed)
INFECTIOUS DISEASE CLINIC FOLLOW UP VISIT  RFV: completion of antibiotics for enterococcal bacteremia/endocarditis/epidural abscess; and new gait instability  Subjective:    Patient ID: [name], female    DOB: November 07, 1940, 71 y.o.   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 She was also found to have an  epidural abscess.s/p decompressive lumbar laminectomy and evacuation of epidural abscess. During her infections work-up, she was found to have a vegetation on TEE c/w endocarditis which could explain dissemination to infection to spine. She completed 6 wks of ampicillin and gentamicin. She was last seen in ID clinic in early January at the completion of her antibiotics, but at this time she complained of new onset gait instability. Due to concern for gentamicin neurotoxicity, she was sent to [facility] for a vestibular evaluation which was negative. In addition she finished an audilogy exam which was normal. She still suffers from gait instability despite starting vestibular physical therapy. ENT felt this was due to decondition from prolonged hospitalization and immobility. Her physical therapy is reportedly "some improvement but not as much as expected" per patient's husband report.  She denies fever, chills, nightsweats. Improved back pain, no lightheadedness, although she still needs to hold onto railing occasionally when she walks. Good appetite, no diarrhea/constipation. No nosebleeds or other signs of spontaneous bleeding. Her mood fluctuates from being up and down, with occasional lability due to prolonged illness. Active Ambulatory Problems    Diagnosis Date Noted  . 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 Sepsis 03/16/2011  . History  of TTP (thrombotic thrombocytopenic purpura) 03/16/2011  . COPD (chronic obstructive pulmonary disease) 03/16/2011   Resolved Ambulatory Problems    Diagnosis Date Noted  . No Resolved Ambulatory Problems   Past Medical History  Diagnosis Date  . Septicemia 03/2011  . Normal echocardiogram 03/15/11  . Acute delirium   . Bacteremia 03/2011  . Blood transfusion   . Anemia   . Arthritis   . Chronic back pain   . Spinal stenosis, lumbar   . Blood dyscrasia   . Depression    Prior to Admission medications   Medication Sig Start Date End Date Taking? Authorizing Provider  albuterol (PROVENTIL HFA;VENTOLIN HFA) 108 (90 BASE) MCG/ACT inhaler Inhale 2 puffs into the lungs every 6 (six) hours as needed. For shortness of breath    Yes Historical Provider, MD  albuterol (PROVENTIL) (2.5 MG/3ML) 0.083% nebulizer solution Take 2.5 mg by nebulization every 6 (six) hours as needed. For wheezing    Yes Historical Provider, MD  ALPRAZolam (XANAX) 0.25 MG tablet Take 1 tablet (0.25 mg total) by mouth 3 (three) times daily as needed for anxiety. For pain and anxiety 03/23/11  Yes Jeoffrey Massed, MD  cyclobenzaprine (FLEXERIL) 10 MG tablet Take 10 mg by mouth 3 (three) times daily as needed. For pain/muscle spasms    Yes Historical Provider, MD  FLUoxetine (PROZAC) 20 MG capsule Take 30 mg by mouth daily.    Yes Marya Amsler. Dareen Piano, MD  gabapentin (NEURONTIN) 400 MG capsule Take 1 capsule (400 mg total) by mouth 3 (three) times daily. 03/23/11 03/22/12 Yes Shanker Ghimire, MD  gentamicin (GARAMYCIN) 1.2-0.9 MG/ML-% Inject 50 mLs (60 mg total) into the vein every 12 (twelve) hours. 03/23/11  Yes Jeoffrey Massed, MD  meloxicam (MOBIC) 7.5 MG tablet Take 7.5 mg by mouth daily.     Yes Historical Provider, MD  oxyCODONE-acetaminophen (PERCOCET) 7.5-325 MG per tablet Take 1 tablet by mouth every 4 (four) hours as needed. For pain 03/23/11  Yes Jeoffrey Massed, MD  rosuvastatin (CRESTOR) 20 MG tablet Take 20  mg by mouth daily.     Yes Historical Provider, MD  rosuvastatin (CRESTOR) 20 MG tablet Take 1 tablet (20 mg total) by mouth daily at 6 PM. 03/23/11 03/22/12 Yes Shanker Ghimire, MD  senna (SENOKOT) 8.6 MG TABS Take 2 tablets (17.2 mg total) by mouth 2 (two) times daily. 03/23/11  Yes Shanker Ghimire, MD  sodium chloride 0.9 % SOLN 50 mL with ampicillin 2 G SOLR 2 g Inject 2 g into the vein every 4 (four) hours. 03/23/11  Yes Shanker Ghimire, MD  tiotropium (SPIRIVA) 18 MCG inhalation capsule Place 18 mcg into inhaler and inhale daily.     Yes Historical Provider, MD    Review of Systems 10 point review of systems has been discussed/reviewed with positive pertinents listed in HPI    Objective:   Physical Exam Constitutional:oriented to person, place, and time. well-developed and well-nourished. No distress. Not as upbeat as in last visit HENT:  Mouth/Throat: Oropharynx is clear and moist. No oropharyngeal exudate.  Cardiovascular: Normal rate, regular rhythm and normal heart sounds. Exam reveals no gallop and no friction rub.  No murmur heard.  Pulmonary/Chest: Effort normal and breath sounds normal. No respiratory distress. He has no wheezes.  Abdominal: Soft. Bowel sounds are normal. He exhibits no distension. There is no tenderness.  Lymphadenopathy:  He has no cervical adenopathy.  Neurological:   Finger to nose equal bilaterally. Equal strengths bilaterally in upper and lower muscle groups of upper and lower extremities. Skin: Skin is warm and dry. No rash noted. No erythema.  Psychiatric:  normal mood and affect.  behavior is normal.   Lab Results  Component Value Date   ESRSEDRATE 4 06/09/2011      Assessment & Plan:  1) enterococcal endocarditis with epidural abscess s/p laminectomy s/p 6 wks of ampicillin and gentamicin = finished course of therapy. ESR back to baseline. Patient does not report any symptoms suggestive of recurrent infection.  2) gait instability = will do a  trial of 3-4 wk of vestibular physical therapy. If patient does not have significant improvement, we will refer to neurology for further evaluation +/- MRI.  Duke Salvia Drue Second MD MPH [facility] [phone]

## 2011-06-17 ENCOUNTER — Other Ambulatory Visit: Payer: Medicare Other

## 2011-06-17 ENCOUNTER — Telehealth: Payer: Self-pay | Admitting: Oncology

## 2011-06-17 ENCOUNTER — Ambulatory Visit: Payer: Medicare Other | Admitting: Nurse Practitioner

## 2011-06-17 ENCOUNTER — Other Ambulatory Visit: Payer: Medicare Other | Admitting: Lab

## 2011-06-17 ENCOUNTER — Other Ambulatory Visit: Payer: Self-pay | Admitting: *Deleted

## 2011-06-17 DIAGNOSIS — Z862 Personal history of diseases of the blood and blood-forming organs and certain disorders involving the immune mechanism: Secondary | ICD-10-CM

## 2011-06-17 LAB — CBC & DIFF AND RETIC
Basophils Absolute: 0 10*3/uL (ref 0.0–0.1)
Eosinophils Absolute: 0.3 10*3/uL (ref 0.0–0.5)
HCT: 38.1 % (ref 34.8–46.6)
LYMPH%: 50.1 % — ABNORMAL HIGH (ref 14.0–49.7)
MCV: 87.2 fL (ref 79.5–101.0)
MONO#: 0.5 10*3/uL (ref 0.1–0.9)
MONO%: 10.6 % (ref 0.0–14.0)
NEUT#: 1.4 10*3/uL — ABNORMAL LOW (ref 1.5–6.5)
NEUT%: 31 % — ABNORMAL LOW (ref 38.4–76.8)
Platelets: 113 10*3/uL — ABNORMAL LOW (ref 145–400)
RBC: 4.37 10*6/uL (ref 3.70–5.45)
Retic %: 2.18 % — ABNORMAL HIGH (ref 0.70–2.10)
WBC: 4.5 10*3/uL (ref 3.9–10.3)

## 2011-06-17 LAB — COMPREHENSIVE METABOLIC PANEL
ALT: 18 U/L (ref 0–35)
AST: 26 U/L (ref 0–37)
Albumin: 4.3 g/dL (ref 3.5–5.2)
Alkaline Phosphatase: 61 U/L (ref 39–117)
Glucose, Bld: 97 mg/dL (ref 70–99)
Potassium: 4.7 mEq/L (ref 3.5–5.3)
Sodium: 142 mEq/L (ref 135–145)
Total Bilirubin: 0.4 mg/dL (ref 0.3–1.2)
Total Protein: 6.6 g/dL (ref 6.0–8.3)

## 2011-06-17 LAB — LACTATE DEHYDROGENASE: LDH: 205 U/L (ref 94–250)

## 2011-06-17 LAB — MORPHOLOGY
PLT EST: DECREASED
RBC Comments: NORMAL

## 2011-06-17 NOTE — Telephone Encounter (Signed)
Called pt, left message for lab appt on 06/20/11

## 2011-06-20 ENCOUNTER — Other Ambulatory Visit: Payer: Medicare Other | Admitting: Lab

## 2011-06-20 ENCOUNTER — Ambulatory Visit (HOSPITAL_BASED_OUTPATIENT_CLINIC_OR_DEPARTMENT_OTHER): Payer: Medicare Other | Admitting: Nurse Practitioner

## 2011-06-20 ENCOUNTER — Telehealth: Payer: Self-pay | Admitting: Oncology

## 2011-06-20 VITALS — BP 111/72 | HR 109 | Temp 98.2°F | Ht 64.0 in | Wt 168.6 lb

## 2011-06-20 DIAGNOSIS — Z862 Personal history of diseases of the blood and blood-forming organs and certain disorders involving the immune mechanism: Secondary | ICD-10-CM

## 2011-06-20 LAB — CBC WITH DIFFERENTIAL/PLATELET
Basophils Absolute: 0 10*3/uL (ref 0.0–0.1)
HCT: 40.3 % (ref 34.8–46.6)
HGB: 13.9 g/dL (ref 11.6–15.9)
MONO#: 0.5 10*3/uL (ref 0.1–0.9)
NEUT#: 3 10*3/uL (ref 1.5–6.5)
NEUT%: 43.2 % (ref 38.4–76.8)
RDW: 14.3 % (ref 11.2–14.5)
WBC: 6.9 10*3/uL (ref 3.9–10.3)
lymph#: 3.1 10*3/uL (ref 0.9–3.3)

## 2011-06-20 NOTE — Progress Notes (Signed)
OFFICE PROGRESS NOTE  Interval history:  [name] is a 71 year old woman with multiply relapsed TTP. The most recent relapse occurred October 2011. She was treated with steroids and plasmapheresis as well as a repeat series of Rituxan. She was also started on Imuran.  [name] was admitted to [facility] hospital November 2012 with back pain, fever and altered mental status. Blood cultures returned positive for enterococcus. MRI showed an epidural abscess at the level of L1-L3 with thecal sac compression. Antibiotics were initiated with ampicillin and gentamicin. She was transferred to [facility] 03/16/2011 for further evaluation and management.  She was evaluated by Dr. Wynetta Emery and underwent a decompressive lumbar laminectomy L1-2, L2-3, L3-4, with microscopic dissection of the left L2-3 and 4 nerve roots and microscopic evacuation of the ventral lumbar epidural abscess on 03/17/2011. Additional evaluation with a TEE was consistent with endocarditis. She completed 6 weeks of ampicillin and gentamicin.  [name] denies back pain. No fever, chills or sweats. She is working with physical therapy for a gait disturbance.  Prior to the above hospitalization she was smoking 2 to 3 packs of cigarettes per day. She quit smoking completely while hospitalized. She is currently smoking one to 2 cigarettes per day.  She has periodic headaches. She notes that her vision is blurred at times. No diplopia.   Objective: Blood pressure 111/72, pulse 109, temperature 98.2 F (36.8 C), temperature source Oral, height 5\' 4"  (1.626 m), weight 168 lb 9.6 oz (76.476 kg).  Pupils equal round and reactive to light. Extraocular movements intact. Sclera anicteric. Oropharynx is without thrush or ulceration. No palpable cervical, supraclavicular or axillary lymph nodes. Lungs are clear. Regular cardiac rhythm. Abdomen is soft and nontender. No organomegaly. Extremities are without edema. Motor strength is 5 over 5.  DTRs 2+, symmetric. She is ambulating with a cane.  Lab Results: Lab Results  Component Value Date   WBC 6.9 06/20/2011   HGB 13.9 06/20/2011   HCT 40.3 06/20/2011   MCV 89.6 06/20/2011   PLT 130* 06/20/2011    Chemistry:    Chemistry      Component Value Date/Time   NA 142 06/17/2011 0932   K 4.7 06/17/2011 0932   CL 105 06/17/2011 0932   CO2 28 06/17/2011 0932   BUN 19 06/17/2011 0932   CREATININE 1.25* 06/17/2011 0932   CREATININE 1.52* 05/05/2011 1117      Component Value Date/Time   CALCIUM 9.8 06/17/2011 0932   ALKPHOS 61 06/17/2011 0932   AST 26 06/17/2011 0932   ALT 18 06/17/2011 0932   BILITOT 0.4 06/17/2011 0932       Studies/Results: No results found.  Medications: I have reviewed the patient's current medications.  Assessment/Plan:  1. TTP initially diagnosed January 2005 presenting with transient neurologic deficits.  She relapsed in November 2006.  Second relapse October 2011 with treatment as above. Imuran was discontinued in November 2012. 2. Obstructive airway disease. 3. Degenerative arthritis of the spine. 4. Bilateral cataracts status post removal. 5. Tobacco use:  She has dramatically decreased the number of cigarettes she is smoking daily. 6. Hospitalization November 2012 with enterococcal bacteremia/endocarditis/epidural abscess status post a 6 week course of gentamicin and ampicillin. 7. Status post decompressive lumbar laminectomy L1-2, L2-3, L3-4, with microscopic dissection of the left L2-3 and 4 nerve roots and microscopic evacuation of the ventral lumbar epidural abscess on 03/17/2011.  Disposition-[name] remains in remission from the TTP. She will return in 2 weeks for a CBC. If  blood counts are stable at that time we will go back to the monthly lab schedule. She will return for a followup visit with Dr. Cyndie Chime in 10 weeks. She will contact the office the interim with any problems.   Plan reviewed with Dr. Cyndie Chime.  Lonna Cobb ANP/GNP-BC

## 2011-06-20 NOTE — Telephone Encounter (Signed)
gv pt appt for april2013 

## 2011-07-01 ENCOUNTER — Encounter: Payer: Self-pay | Admitting: Neurosurgery

## 2011-07-04 ENCOUNTER — Other Ambulatory Visit: Payer: Self-pay | Admitting: Oncology

## 2011-07-15 ENCOUNTER — Ambulatory Visit: Payer: Self-pay | Admitting: Internal Medicine

## 2011-08-01 ENCOUNTER — Other Ambulatory Visit (HOSPITAL_BASED_OUTPATIENT_CLINIC_OR_DEPARTMENT_OTHER): Payer: Medicare Other | Admitting: Lab

## 2011-08-01 ENCOUNTER — Telehealth: Payer: Self-pay

## 2011-08-01 ENCOUNTER — Encounter: Payer: Self-pay | Admitting: Neurosurgery

## 2011-08-01 DIAGNOSIS — Z862 Personal history of diseases of the blood and blood-forming organs and certain disorders involving the immune mechanism: Secondary | ICD-10-CM

## 2011-08-01 LAB — CBC WITH DIFFERENTIAL/PLATELET
Basophils Absolute: 0.1 10*3/uL (ref 0.0–0.1)
EOS%: 3.8 % (ref 0.0–7.0)
Eosinophils Absolute: 0.2 10*3/uL (ref 0.0–0.5)
HCT: 39.6 % (ref 34.8–46.6)
HGB: 13.8 g/dL (ref 11.6–15.9)
MCH: 30.5 pg (ref 25.1–34.0)
NEUT#: 2.9 10*3/uL (ref 1.5–6.5)
NEUT%: 45.3 % (ref 38.4–76.8)
lymph#: 2.8 10*3/uL (ref 0.9–3.3)

## 2011-08-01 NOTE — Telephone Encounter (Signed)
Pt's spouse notified by phone of platelet count.  [name] expresses appreciation. dph

## 2011-08-01 NOTE — Telephone Encounter (Signed)
Message copied by Albertha Ghee on Mon Aug 01, 2011  4:28 PM ------      Message from: Levert Feinstein      Created: Mon Aug 01, 2011  4:24 PM       Call pt platelets stable @ 138,000

## 2011-08-29 ENCOUNTER — Telehealth: Payer: Self-pay | Admitting: Oncology

## 2011-08-29 ENCOUNTER — Other Ambulatory Visit: Payer: Medicare Other | Admitting: Lab

## 2011-08-29 ENCOUNTER — Ambulatory Visit (HOSPITAL_BASED_OUTPATIENT_CLINIC_OR_DEPARTMENT_OTHER): Payer: Medicare Other | Admitting: Oncology

## 2011-08-29 VITALS — BP 106/60 | HR 90 | Temp 98.2°F | Ht 64.0 in | Wt 166.4 lb

## 2011-08-29 DIAGNOSIS — M311 Thrombotic microangiopathy: Secondary | ICD-10-CM

## 2011-08-29 DIAGNOSIS — Z862 Personal history of diseases of the blood and blood-forming organs and certain disorders involving the immune mechanism: Secondary | ICD-10-CM

## 2011-08-29 LAB — CBC WITH DIFFERENTIAL/PLATELET
Basophils Absolute: 0.1 10*3/uL (ref 0.0–0.1)
Eosinophils Absolute: 0.2 10*3/uL (ref 0.0–0.5)
HCT: 39.9 % (ref 34.8–46.6)
HGB: 13.7 g/dL (ref 11.6–15.9)
LYMPH%: 45.4 % (ref 14.0–49.7)
MCV: 91.3 fL (ref 79.5–101.0)
MONO#: 0.4 10*3/uL (ref 0.1–0.9)
MONO%: 7.1 % (ref 0.0–14.0)
NEUT#: 2.6 10*3/uL (ref 1.5–6.5)
NEUT%: 42.9 % (ref 38.4–76.8)
Platelets: 128 10*3/uL — ABNORMAL LOW (ref 145–400)
RBC: 4.38 10*6/uL (ref 3.70–5.45)
WBC: 6.1 10*3/uL (ref 3.9–10.3)

## 2011-08-29 LAB — COMPREHENSIVE METABOLIC PANEL
AST: 20 U/L (ref 0–37)
Albumin: 4.3 g/dL (ref 3.5–5.2)
BUN: 22 mg/dL (ref 6–23)
CO2: 29 mEq/L (ref 19–32)
Calcium: 9.6 mg/dL (ref 8.4–10.5)
Chloride: 107 mEq/L (ref 96–112)
Creatinine, Ser: 1.19 mg/dL — ABNORMAL HIGH (ref 0.50–1.10)
Potassium: 4.1 mEq/L (ref 3.5–5.3)

## 2011-08-29 LAB — LACTATE DEHYDROGENASE: LDH: 186 U/L (ref 94–250)

## 2011-08-29 NOTE — Progress Notes (Signed)
Hematology and Oncology Follow Up Visit  [name] [id number] 1940/05/15 71 y.o. 08/29/2011 3:11 PM   Principle Diagnosis: Encounter Diagnosis  Name Primary?  . TTP (thrombotic thrombocytopenic purpura) Yes     Interim History:   Followup visit for this 71 year old woman with chronic relapsing TTP. She was initially diagnosed in January 2005 when she presented with transient neurologic deficits. She was treated with plasma exchange and steroids. She was initially given antiplatelet agents aspirin and Persantine at [facility] in [place]. When she achieved a partial remission she was sent to [facility] for further management. She relapsed in November 2006. She was treated again with plasma exchange steroids, and then with Rituxan when she failed to achieve a complete response with the steroids and plasma exchange. She was started on a brief trial of Imuran which had to be stopped when she developed line sepsis. 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 She relapsed for a second time in October 2011 and once again was treated with steroids and plasma exchange. Once again, attempts to taper and stop plasma exchange resulted in a fall in her platelet count. She was given a second course of Rituxan. She was started on Imuran initially at a dose of 100 mg daily then escalating to 150 mg.. daily. Imuran was continued through November 2012 when she was hospitalized on an emergency basis for fever, confusion, and back pain and found to have enterococcal bacteremia, and an epidural abscess, and endocarditis. She received a prolonged course of antibiotics with gentamicin and ampicillin. She underwent initial emergency  lumbar laminectomy. (March 17 2011) and evacuation of epidural abscess.. She had a minor fall in her platelet count when she was septic. There was no evidence that she was having any  relapse of her TTP however. Despite a complete recovery of her platelet count with control of sepsis and a platelet count of 280,000 recorded on 03/21/2011, platelet count began to drift down again in January of this year when it was 147,000 with subsequent values ranging between 113 and 128,000. No evidence for associated hemolysis and hemoglobin remains normal at 13.7 today with white count 6100 and platelets 128,000. Bilirubin and LDH and reticulocyte count remain normal with most recent values of 0.4 and 205 and 2.1% respectively on February 15. Today's values pending.  She's had no interim medical problems. No fever or subsequent infections. She tried to stop smoking. This lasted for about 12 weeks and she is now smoking again.     Medications: reviewed  Allergies:  Allergies  Allergen Reactions  . Adhesive (Tape)     blisters  . Other     Wool: Reaction is hives Advertising account executive tape: Reaction is blistering   . Sulfa Antibiotics Hives  . Sulfa Drugs Cross Reactors     Review of Systems: Constitutional:   No constitutional symptoms Respiratory: Chronic smoker's cough Cardiovascular:  No chest pain or palpitations Gastrointestinal: No change in bowel habit Genito-Urinary:  Musculoskeletal: No bone pain Neurologic: No headache or change in vision Skin: Remaining ROS negative.  Physical Exam: Blood pressure 106/60, pulse 90, temperature 98.2 F (36.8 C), temperature source Oral, height 5\' 4"  (1.626 m), weight 166 lb 6.4 oz (75.479 kg). Wt Readings from Last 3 Encounters:  08/29/11 166 lb 6.4 oz (75.479 kg)  06/20/11 168 lb 9.6 oz (76.476 kg)  06/09/11 169 lb (76.658 kg)     General  appearance: Well-nourished Caucasian woman HENNT: Pharynx no erythema or exudate Lymph nodes: No adenopathy Breasts: Not examined today Lungs: Clear to auscultation resonant to percussion Heart: Regular rhythm no murmur Abdomen: Soft nontender no mass no  organomegaly Extremities: No edema no calf tenderness Vascular: No cyanosis Neurologic: Mental status intact, cranial nerves intact, optic disc sharp vessels normal, motor strength 5 over 5, reflexes 2+ symmetric Skin: No rash or ecchymosis  Lab Results: Lab Results  Component Value Date   WBC 6.1 08/29/2011   HGB 13.7 08/29/2011   HCT 39.9 08/29/2011   MCV 91.3 08/29/2011   PLT 128* 08/29/2011     Chemistry      Component Value Date/Time   NA 142 06/17/2011 0932   K 4.7 06/17/2011 0932   CL 105 06/17/2011 0932   CO2 28 06/17/2011 0932   BUN 19 06/17/2011 0932   CREATININE 1.25* 06/17/2011 0932   CREATININE 1.52* 05/05/2011 1117      Component Value Date/Time   CALCIUM 9.8 06/17/2011 0932   ALKPHOS 61 06/17/2011 0932   AST 26 06/17/2011 0932   ALT 18 06/17/2011 0932   BILITOT 0.4 06/17/2011 0932        Impression and Plan: #1. Chronic relapsing TTP currently back in third remission. I have elected not to resume her Imuran at this point. I will continue to monitor her blood counts on a monthly basis however.  #2. Borderline thrombocytopenia. Counts are fluctuating but stable and consistently above 100,000. Plan is close observation alone.  #3. Recent epidural abscess, enterococcal sepsis, and endocarditis.  #4. Chronic degenerative arthritis of the spine  #5. Tobacco addiction She has absolutely no incentive to stop smoking again.  #6. Obstructive airway disease secondary to #5   CC:. Dr. Barthol[facility] [place]; Dr. Tama Gander [facility]   Levert Feinstein, MD 4/29/20133:11 PM

## 2011-08-29 NOTE — Telephone Encounter (Signed)
gv pt appt schedule for may thru oct  °

## 2011-08-31 ENCOUNTER — Encounter: Payer: Self-pay | Admitting: Neurosurgery

## 2011-09-27 ENCOUNTER — Other Ambulatory Visit (HOSPITAL_BASED_OUTPATIENT_CLINIC_OR_DEPARTMENT_OTHER): Payer: Medicare Other | Admitting: Lab

## 2011-09-27 DIAGNOSIS — M311 Thrombotic microangiopathy: Secondary | ICD-10-CM

## 2011-09-27 LAB — COMPREHENSIVE METABOLIC PANEL
ALT: 14 U/L (ref 0–35)
AST: 18 U/L (ref 0–37)
Albumin: 4.2 g/dL (ref 3.5–5.2)
Alkaline Phosphatase: 60 U/L (ref 39–117)
Calcium: 9.4 mg/dL (ref 8.4–10.5)
Chloride: 108 mEq/L (ref 96–112)
Potassium: 4.6 mEq/L (ref 3.5–5.3)
Sodium: 143 mEq/L (ref 135–145)

## 2011-09-27 LAB — CBC & DIFF AND RETIC
Basophils Absolute: 0 10*3/uL (ref 0.0–0.1)
Eosinophils Absolute: 0.3 10*3/uL (ref 0.0–0.5)
HCT: 40.7 % (ref 34.8–46.6)
HGB: 14.2 g/dL (ref 11.6–15.9)
Immature Retic Fract: 8.3 % (ref 1.60–10.00)
MCH: 31.7 pg (ref 25.1–34.0)
MONO#: 0.5 10*3/uL (ref 0.1–0.9)
NEUT#: 3 10*3/uL (ref 1.5–6.5)
NEUT%: 47 % (ref 38.4–76.8)
RDW: 13.6 % (ref 11.2–14.5)
Retic Ct Abs: 84.67 10*3/uL (ref 33.70–90.70)
WBC: 6.3 10*3/uL (ref 3.9–10.3)
lymph#: 2.5 10*3/uL (ref 0.9–3.3)

## 2011-09-27 LAB — CHCC SMEAR

## 2011-09-28 ENCOUNTER — Telehealth: Payer: Self-pay

## 2011-09-28 NOTE — Telephone Encounter (Signed)
Message copied by Albertha Ghee on Wed Sep 28, 2011 12:27 PM ------      Message from: Levert Feinstein      Created: Tue Sep 27, 2011  2:05 PM       Call pt  Platelets holding at 128,000  Hb normal

## 2011-09-28 NOTE — Telephone Encounter (Signed)
Message left on personalized VM with lab results per Dr Cyndie Chime. dph

## 2011-10-01 ENCOUNTER — Encounter: Payer: Self-pay | Admitting: Neurosurgery

## 2011-10-03 ENCOUNTER — Other Ambulatory Visit: Payer: Medicare Other | Admitting: Lab

## 2011-10-17 ENCOUNTER — Encounter: Payer: Self-pay | Admitting: Internal Medicine

## 2011-10-25 ENCOUNTER — Other Ambulatory Visit (HOSPITAL_BASED_OUTPATIENT_CLINIC_OR_DEPARTMENT_OTHER): Payer: Medicare Other | Admitting: Lab

## 2011-10-25 DIAGNOSIS — M311 Thrombotic microangiopathy: Secondary | ICD-10-CM

## 2011-10-25 LAB — CBC & DIFF AND RETIC
Basophils Absolute: 0 10*3/uL (ref 0.0–0.1)
EOS%: 3.4 % (ref 0.0–7.0)
Eosinophils Absolute: 0.2 10*3/uL (ref 0.0–0.5)
LYMPH%: 40.6 % (ref 14.0–49.7)
MCH: 31.3 pg (ref 25.1–34.0)
MCV: 90.1 fL (ref 79.5–101.0)
MONO%: 5.5 % (ref 0.0–14.0)
NEUT#: 3.1 10*3/uL (ref 1.5–6.5)
Platelets: 120 10*3/uL — ABNORMAL LOW (ref 145–400)
RBC: 4.96 10*6/uL (ref 3.70–5.45)
RDW: 13.4 % (ref 11.2–14.5)
Retic Ct Abs: 82.83 10*3/uL (ref 33.70–90.70)

## 2011-10-31 ENCOUNTER — Encounter: Payer: Self-pay | Admitting: Internal Medicine

## 2011-10-31 ENCOUNTER — Other Ambulatory Visit: Payer: Medicare Other | Admitting: Lab

## 2011-11-02 ENCOUNTER — Other Ambulatory Visit: Payer: Self-pay | Admitting: *Deleted

## 2011-11-02 ENCOUNTER — Telehealth: Payer: Self-pay | Admitting: *Deleted

## 2011-11-02 DIAGNOSIS — Z862 Personal history of diseases of the blood and blood-forming organs and certain disorders involving the immune mechanism: Secondary | ICD-10-CM

## 2011-11-02 NOTE — Telephone Encounter (Signed)
Pt notified of plt result & retic added to lab at end of month.

## 2011-11-02 NOTE — Telephone Encounter (Signed)
Message copied by Sabino Snipes on Wed Nov 02, 2011  4:34 PM ------      Message from: Levert Feinstein      Created: Sun Oct 30, 2011  6:02 PM       Call pt: platelets 120,000 chek CBC, retiic in 1 month

## 2011-11-22 ENCOUNTER — Other Ambulatory Visit (HOSPITAL_BASED_OUTPATIENT_CLINIC_OR_DEPARTMENT_OTHER): Payer: Medicare Other | Admitting: Lab

## 2011-11-22 ENCOUNTER — Telehealth: Payer: Self-pay | Admitting: *Deleted

## 2011-11-22 DIAGNOSIS — M311 Thrombotic microangiopathy: Secondary | ICD-10-CM

## 2011-11-22 DIAGNOSIS — Z862 Personal history of diseases of the blood and blood-forming organs and certain disorders involving the immune mechanism: Secondary | ICD-10-CM

## 2011-11-22 LAB — CBC & DIFF AND RETIC
Basophils Absolute: 0 10*3/uL (ref 0.0–0.1)
Eosinophils Absolute: 0.3 10*3/uL (ref 0.0–0.5)
HCT: 42.4 % (ref 34.8–46.6)
LYMPH%: 48.2 % (ref 14.0–49.7)
MCV: 90.4 fL (ref 79.5–101.0)
MONO#: 0.4 10*3/uL (ref 0.1–0.9)
MONO%: 5.8 % (ref 0.0–14.0)
NEUT#: 2.8 10*3/uL (ref 1.5–6.5)
NEUT%: 41.7 % (ref 38.4–76.8)
Platelets: 131 10*3/uL — ABNORMAL LOW (ref 145–400)
WBC: 6.7 10*3/uL (ref 3.9–10.3)

## 2011-11-22 LAB — CHCC SMEAR

## 2011-11-22 LAB — COMPREHENSIVE METABOLIC PANEL
Albumin: 4.2 g/dL (ref 3.5–5.2)
BUN: 22 mg/dL (ref 6–23)
CO2: 26 mEq/L (ref 19–32)
Calcium: 10.2 mg/dL (ref 8.4–10.5)
Chloride: 105 mEq/L (ref 96–112)
Glucose, Bld: 94 mg/dL (ref 70–99)
Potassium: 4.4 mEq/L (ref 3.5–5.3)

## 2011-11-22 NOTE — Telephone Encounter (Signed)
Called patient and spoke with husband and let him know that her platelets are stable at 131,000.

## 2011-11-22 NOTE — Telephone Encounter (Signed)
Message copied by Orbie Hurst on Tue Nov 22, 2011  4:32 PM ------      Message from: Levert Feinstein      Created: Tue Nov 22, 2011  2:16 PM       Call pt lab stable - platelets 131,000

## 2011-11-28 ENCOUNTER — Other Ambulatory Visit: Payer: Medicare Other | Admitting: Lab

## 2011-12-20 ENCOUNTER — Other Ambulatory Visit (HOSPITAL_BASED_OUTPATIENT_CLINIC_OR_DEPARTMENT_OTHER): Payer: Medicare Other | Admitting: Lab

## 2011-12-20 DIAGNOSIS — M311 Thrombotic microangiopathy: Secondary | ICD-10-CM

## 2011-12-20 LAB — CBC & DIFF AND RETIC
BASO%: 0.7 % (ref 0.0–2.0)
Eosinophils Absolute: 0.2 10*3/uL (ref 0.0–0.5)
MCHC: 35.1 g/dL (ref 31.5–36.0)
MONO#: 0.5 10*3/uL (ref 0.1–0.9)
MONO%: 7 % (ref 0.0–14.0)
NEUT#: 3.6 10*3/uL (ref 1.5–6.5)
RBC: 4.91 10*6/uL (ref 3.70–5.45)
RDW: 13.1 % (ref 11.2–14.5)
Retic %: 1.42 % (ref 0.70–2.10)
Retic Ct Abs: 69.72 10*3/uL (ref 33.70–90.70)
WBC: 6.9 10*3/uL (ref 3.9–10.3)

## 2011-12-20 LAB — CHCC SMEAR

## 2011-12-26 ENCOUNTER — Other Ambulatory Visit: Payer: Medicare Other | Admitting: Lab

## 2011-12-27 ENCOUNTER — Ambulatory Visit: Payer: Medicare Other | Admitting: Nurse Practitioner

## 2012-01-04 ENCOUNTER — Other Ambulatory Visit (HOSPITAL_BASED_OUTPATIENT_CLINIC_OR_DEPARTMENT_OTHER): Payer: Medicare Other | Admitting: Lab

## 2012-01-04 ENCOUNTER — Ambulatory Visit: Payer: Medicare Other | Admitting: Nurse Practitioner

## 2012-01-04 DIAGNOSIS — M311 Thrombotic microangiopathy: Secondary | ICD-10-CM

## 2012-01-04 DIAGNOSIS — Z862 Personal history of diseases of the blood and blood-forming organs and certain disorders involving the immune mechanism: Secondary | ICD-10-CM

## 2012-01-04 LAB — CBC & DIFF AND RETIC
BASO%: 0.6 % (ref 0.0–2.0)
EOS%: 3.6 % (ref 0.0–7.0)
LYMPH%: 35.6 % (ref 14.0–49.7)
MCH: 31.8 pg (ref 25.1–34.0)
MCHC: 35.1 g/dL (ref 31.5–36.0)
MONO#: 0.5 10*3/uL (ref 0.1–0.9)
MONO%: 7.4 % (ref 0.0–14.0)
NEUT%: 52.8 % (ref 38.4–76.8)
Platelets: 132 10*3/uL — ABNORMAL LOW (ref 145–400)
RBC: 4.72 10*6/uL (ref 3.70–5.45)
Retic %: 1.73 % (ref 0.70–2.10)
WBC: 6.9 10*3/uL (ref 3.9–10.3)

## 2012-01-04 LAB — COMPREHENSIVE METABOLIC PANEL (CC13)
BUN: 29 mg/dL — ABNORMAL HIGH (ref 7.0–26.0)
CO2: 23 mEq/L (ref 22–29)
Calcium: 9.7 mg/dL (ref 8.4–10.4)
Chloride: 105 mEq/L (ref 98–107)
Creatinine: 1.1 mg/dL (ref 0.6–1.1)
Glucose: 95 mg/dl (ref 70–99)
Total Bilirubin: 0.5 mg/dL (ref 0.20–1.20)

## 2012-01-04 LAB — CHCC SMEAR

## 2012-01-05 ENCOUNTER — Ambulatory Visit (HOSPITAL_BASED_OUTPATIENT_CLINIC_OR_DEPARTMENT_OTHER): Payer: Medicare Other | Admitting: Nurse Practitioner

## 2012-01-05 ENCOUNTER — Telehealth: Payer: Self-pay | Admitting: Oncology

## 2012-01-05 VITALS — BP 109/62 | HR 84 | Temp 98.2°F | Resp 18 | Ht 64.0 in | Wt 159.4 lb

## 2012-01-05 DIAGNOSIS — F17200 Nicotine dependence, unspecified, uncomplicated: Secondary | ICD-10-CM

## 2012-01-05 DIAGNOSIS — M311 Thrombotic microangiopathy: Secondary | ICD-10-CM

## 2012-01-05 DIAGNOSIS — D696 Thrombocytopenia, unspecified: Secondary | ICD-10-CM

## 2012-01-05 DIAGNOSIS — Z862 Personal history of diseases of the blood and blood-forming organs and certain disorders involving the immune mechanism: Secondary | ICD-10-CM

## 2012-01-05 NOTE — Telephone Encounter (Signed)
appts mdae and printed for pt aom °

## 2012-01-05 NOTE — Progress Notes (Signed)
OFFICE PROGRESS NOTE  Interval history:  [name] is a 71 year old woman with chronic relapsing TTP. She was initially diagnosed in January 2005 at which time she presented with transient neurologic deficits. She was treated with plasma exchange and steroids. She was initially given antiplatelet agents aspirin and Persantine at [facility] in [place]. When she achieved a partial remission her care was transferred to [facility] for further management. She relapsed in November 2006 and was again treated with plasma exchange and steroids. Rituxan was added when she failed to achieve a complete response. She was started on a brief trial of Imuran which had to be stopped when she developed line sepsis. She relapsed for a second time in October 2011 and was again treated with steroids and plasma exchange. Attempts to taper and stop the plasma exchange resulted in a fall in the platelet count. She was given a second course of Rituxan and was started on Imuran initially at a dose of 100 mg daily then escalated to 150 mg daily. Imuran was continued through November 2012 when she was hospitalized for fever, confusion and back pain and found to have enterococcal bacteremia an epidural abscess and endocarditis. She received a prolonged course of antibiotics with gentamicin and ampicillin. She underwent an initial emergency lumbar laminectomy and evacuation of the epidural abscess. She had a minor fall in the platelet count when she was septic. There was however no evidence that the TTP was relapsing. There was complete recovery of the platelet count with control of the sepsis. In January of this year the platelet count began to drift down. There was no evidence for associated hemolysis and the hemoglobin has remained stable. The platelet count has ranged from a low of 120,000 to a high of 138,000 over the past approximate 5 months.  She is seen today for scheduled followup. No interim illnesses or infections.  She continues smoking. Her  husband estimates she smokes 3 packs per day. She recently began Seroquel. Her husband feels she is back to her "old self". No bleeding.   Objective: Blood pressure 109/62, pulse 84, temperature 98.2 F (36.8 C), temperature source Oral, resp. rate 18, height 5\' 4"  (1.626 m), weight 159 lb 6.4 oz (72.303 kg).  Pupils equal round and reactive to light. Extraocular movements intact. Sclera anicteric. Oropharynx is without thrush or ulceration. No palpable cervical, supraclavicular or axillary lymph nodes. Lungs are clear. Regular cardiac rhythm. Abdomen is soft and nontender. No organomegaly. Extremities without edema. Calves are soft and nontender. Motor strength 5 over 5. Knee DTRs 2+, symmetric. She is alert and oriented. Follows commands.  Lab Results: Lab Results  Component Value Date   WBC 6.9 01/04/2012   HGB 15.0 01/04/2012   HCT 42.7 01/04/2012   MCV 90.5 01/04/2012   PLT 132* 01/04/2012    Chemistry:    Chemistry      Component Value Date/Time   NA 139 01/04/2012 1422   NA 140 11/22/2011 1340   K 4.3 01/04/2012 1422   K 4.4 11/22/2011 1340   CL 105 01/04/2012 1422   CL 105 11/22/2011 1340   CO2 23 01/04/2012 1422   CO2 26 11/22/2011 1340   BUN 29.0* 01/04/2012 1422   BUN 22 11/22/2011 1340   CREATININE 1.1 01/04/2012 1422   CREATININE 1.23* 11/22/2011 1340   CREATININE 1.52* 05/05/2011 1117      Component Value Date/Time   CALCIUM 9.7 01/04/2012 1422   CALCIUM 10.2 11/22/2011 1340   ALKPHOS 59 01/04/2012  1422   ALKPHOS 50 11/22/2011 1340   AST 17 01/04/2012 1422   AST 18 11/22/2011 1340   ALT 14 01/04/2012 1422   ALT 16 11/22/2011 1340   BILITOT 0.50 01/04/2012 1422   BILITOT 0.5 11/22/2011 1340       Studies/Results: No results found.  Medications: I have reviewed the patient's current medications.  Assessment/Plan:  1. Chronic relapsing TTP. 2. Borderline thrombocytopenia. Stable. 3. Epidural abscess, enterococcal sepsis and endocarditis November  2012. 4. Chronic degenerative arthritis of the spine. 5. Continued tobacco use. 6. Obstructive airway disease.  Disposition-Ms. Pricer remains in remission from the TTP. We will continue to check labs on a monthly basis. She will return for a followup visit in 4 months. She will contact the office in the interim with any problems.  Lonna Cobb ANP/GNP-BC

## 2012-01-06 ENCOUNTER — Telehealth: Payer: Self-pay | Admitting: *Deleted

## 2012-01-06 NOTE — Telephone Encounter (Signed)
Message copied by Sabino Snipes on Fri Jan 06, 2012 12:22 PM ------      Message from: Levert Feinstein      Created: Wed Jan 04, 2012  5:35 PM       Call  Platelets stable @ 132,000  Hb 15

## 2012-01-06 NOTE — Telephone Encounter (Signed)
Called & spoke with pt's husband & report of labs given per Dr Cyndie Chime.

## 2012-01-16 ENCOUNTER — Telehealth: Payer: Self-pay | Admitting: *Deleted

## 2012-01-16 NOTE — Telephone Encounter (Signed)
Received call from pt asking about appt for 01/17/12 for labs.  "I was just here 9/4 for labs & 9/5 to see Misty Stanley; do I need to have this lab?"  Note to Dr. Cyndie Chime for clarification.

## 2012-01-17 ENCOUNTER — Telehealth: Payer: Self-pay | Admitting: *Deleted

## 2012-01-17 ENCOUNTER — Other Ambulatory Visit: Payer: Medicare Other | Admitting: Lab

## 2012-01-17 ENCOUNTER — Telehealth: Payer: Self-pay | Admitting: Oncology

## 2012-01-17 NOTE — Telephone Encounter (Signed)
Pt called and cancelled lab for today, nurse notified

## 2012-01-17 NOTE — Telephone Encounter (Signed)
Message left with pt's husband that it is OK to skip lab today per Dr. Cyndie Chime.

## 2012-01-23 ENCOUNTER — Other Ambulatory Visit: Payer: Medicare Other | Admitting: Lab

## 2012-02-02 ENCOUNTER — Other Ambulatory Visit (HOSPITAL_BASED_OUTPATIENT_CLINIC_OR_DEPARTMENT_OTHER): Payer: Medicare Other | Admitting: Lab

## 2012-02-02 DIAGNOSIS — M311 Thrombotic microangiopathy: Secondary | ICD-10-CM

## 2012-02-02 DIAGNOSIS — Z862 Personal history of diseases of the blood and blood-forming organs and certain disorders involving the immune mechanism: Secondary | ICD-10-CM

## 2012-02-02 LAB — CBC WITH DIFFERENTIAL/PLATELET
BASO%: 0.9 % (ref 0.0–2.0)
EOS%: 7.3 % — ABNORMAL HIGH (ref 0.0–7.0)
MCH: 32.2 pg (ref 25.1–34.0)
MCHC: 34 g/dL (ref 31.5–36.0)
MCV: 94.7 fL (ref 79.5–101.0)
MONO%: 8.4 % (ref 0.0–14.0)
RDW: 13.5 % (ref 11.2–14.5)
lymph#: 2.1 10*3/uL (ref 0.9–3.3)

## 2012-02-06 ENCOUNTER — Telehealth: Payer: Self-pay | Admitting: *Deleted

## 2012-02-06 NOTE — Telephone Encounter (Signed)
Message copied by Orbie Hurst on Mon Feb 06, 2012 10:55 AM ------      Message from: Levert Feinstein      Created: Mon Feb 06, 2012  9:45 AM       Call pt lab stable platelets 133,000

## 2012-02-06 NOTE — Telephone Encounter (Signed)
Called patient and let her know that platelets are stable at 133,000.  She appreciated the call back.

## 2012-02-14 ENCOUNTER — Other Ambulatory Visit: Payer: Medicare Other | Admitting: Lab

## 2012-03-01 ENCOUNTER — Telehealth: Payer: Self-pay | Admitting: *Deleted

## 2012-03-01 ENCOUNTER — Other Ambulatory Visit (HOSPITAL_BASED_OUTPATIENT_CLINIC_OR_DEPARTMENT_OTHER): Payer: Medicare Other | Admitting: Lab

## 2012-03-01 DIAGNOSIS — M311 Thrombotic microangiopathy, unspecified: Secondary | ICD-10-CM

## 2012-03-01 LAB — CBC & DIFF AND RETIC
Basophils Absolute: 0 10*3/uL (ref 0.0–0.1)
EOS%: 3.1 % (ref 0.0–7.0)
Eosinophils Absolute: 0.3 10*3/uL (ref 0.0–0.5)
HGB: 15.5 g/dL (ref 11.6–15.9)
Immature Retic Fract: 9.2 % (ref 1.60–10.00)
MCH: 32.3 pg (ref 25.1–34.0)
MCV: 93.5 fL (ref 79.5–101.0)
MONO%: 7.7 % (ref 0.0–14.0)
NEUT#: 5.2 10*3/uL (ref 1.5–6.5)
RBC: 4.8 10*6/uL (ref 3.70–5.45)
RDW: 13.4 % (ref 11.2–14.5)
Retic %: 1.82 % (ref 0.70–2.10)
Retic Ct Abs: 87.36 10*3/uL (ref 33.70–90.70)
lymph#: 2.2 10*3/uL (ref 0.9–3.3)

## 2012-03-01 NOTE — Telephone Encounter (Signed)
Pt notified of plt results per Dr Cyndie Chime.

## 2012-03-01 NOTE — Telephone Encounter (Signed)
Message copied by Sabino Snipes on Thu Mar 01, 2012  4:15 PM ------      Message from: [name]      Created: Thu Mar 01, 2012  9:57 AM       Call pt counts stable platelets 131,000

## 2012-03-30 ENCOUNTER — Other Ambulatory Visit (HOSPITAL_BASED_OUTPATIENT_CLINIC_OR_DEPARTMENT_OTHER): Payer: Medicare Other | Admitting: Lab

## 2012-03-30 ENCOUNTER — Telehealth: Payer: Self-pay | Admitting: *Deleted

## 2012-03-30 DIAGNOSIS — M311 Thrombotic microangiopathy: Secondary | ICD-10-CM

## 2012-03-30 LAB — CBC & DIFF AND RETIC
BASO%: 0.1 % (ref 0.0–2.0)
EOS%: 0 % (ref 0.0–7.0)
Eosinophils Absolute: 0 10*3/uL (ref 0.0–0.5)
LYMPH%: 18.7 % (ref 14.0–49.7)
MCHC: 34.1 g/dL (ref 31.5–36.0)
MCV: 94.1 fL (ref 79.5–101.0)
MONO%: 5.3 % (ref 0.0–14.0)
NEUT#: 9.6 10*3/uL — ABNORMAL HIGH (ref 1.5–6.5)
Platelets: 159 10*3/uL (ref 145–400)
RBC: 4.77 10*6/uL (ref 3.70–5.45)
RDW: 13.5 % (ref 11.2–14.5)
Retic %: 2.24 % — ABNORMAL HIGH (ref 0.70–2.10)
Retic Ct Abs: 106.85 10*3/uL — ABNORMAL HIGH (ref 33.70–90.70)

## 2012-03-30 NOTE — Telephone Encounter (Signed)
Message copied by Sabino Snipes on Fri Mar 30, 2012  1:00 PM ------      Message from: Levert Feinstein      Created: Fri Mar 30, 2012 10:24 AM       Call pt  Hb & platelets normal; white count up. Any signs of infection?

## 2012-03-30 NOTE — Telephone Encounter (Signed)
Called & spoke to pt & reported lab results.  She reports that she has bronchitis & is on a z-pack & prednisone dose pack.

## 2012-04-27 ENCOUNTER — Other Ambulatory Visit: Payer: Medicare Other | Admitting: Lab

## 2012-05-04 ENCOUNTER — Ambulatory Visit (HOSPITAL_BASED_OUTPATIENT_CLINIC_OR_DEPARTMENT_OTHER): Payer: Medicare Other | Admitting: Oncology

## 2012-05-04 ENCOUNTER — Other Ambulatory Visit (HOSPITAL_BASED_OUTPATIENT_CLINIC_OR_DEPARTMENT_OTHER): Payer: Medicare Other | Admitting: Lab

## 2012-05-04 ENCOUNTER — Telehealth: Payer: Self-pay | Admitting: Oncology

## 2012-05-04 VITALS — BP 132/72 | HR 93 | Temp 97.6°F | Resp 20 | Ht 64.0 in | Wt 167.0 lb

## 2012-05-04 DIAGNOSIS — M311 Thrombotic microangiopathy, unspecified: Secondary | ICD-10-CM

## 2012-05-04 DIAGNOSIS — F17200 Nicotine dependence, unspecified, uncomplicated: Secondary | ICD-10-CM

## 2012-05-04 DIAGNOSIS — Z862 Personal history of diseases of the blood and blood-forming organs and certain disorders involving the immune mechanism: Secondary | ICD-10-CM

## 2012-05-04 LAB — COMPREHENSIVE METABOLIC PANEL (CC13)
Albumin: 3.9 g/dL (ref 3.5–5.0)
CO2: 28 mEq/L (ref 22–29)
Calcium: 10.9 mg/dL — ABNORMAL HIGH (ref 8.4–10.4)
Chloride: 105 mEq/L (ref 98–107)
Glucose: 97 mg/dl (ref 70–99)
Potassium: 5.3 mEq/L — ABNORMAL HIGH (ref 3.5–5.1)
Sodium: 143 mEq/L (ref 136–145)
Total Protein: 7.1 g/dL (ref 6.4–8.3)

## 2012-05-04 LAB — CBC & DIFF AND RETIC
BASO%: 0.7 % (ref 0.0–2.0)
EOS%: 4.3 % (ref 0.0–7.0)
HCT: 46.4 % (ref 34.8–46.6)
Immature Retic Fract: 6.6 % (ref 1.60–10.00)
LYMPH%: 39.9 % (ref 14.0–49.7)
MCH: 31.9 pg (ref 25.1–34.0)
MCHC: 34.3 g/dL (ref 31.5–36.0)
MCV: 93.2 fL (ref 79.5–101.0)
MONO#: 0.6 10*3/uL (ref 0.1–0.9)
MONO%: 7.4 % (ref 0.0–14.0)
NEUT%: 47.7 % (ref 38.4–76.8)
Platelets: 146 10*3/uL (ref 145–400)
RBC: 4.98 10*6/uL (ref 3.70–5.45)
nRBC: 0 % (ref 0–0)

## 2012-05-04 LAB — LACTATE DEHYDROGENASE (CC13): LDH: 225 U/L (ref 125–245)

## 2012-05-04 NOTE — Telephone Encounter (Signed)
Gave pt appt for April 2014 lab only and see April 2014 lab and ML

## 2012-05-04 NOTE — Patient Instructions (Signed)
Lab in 3 months; lab and visit with Nurse practitioner in 6 months

## 2012-05-04 NOTE — Progress Notes (Signed)
Hematology and Oncology Follow Up Visit  [name] [id number] 1940-05-05 72 y.o. 05/04/2012 1:54 PM   Principle Diagnosis: Encounter Diagnosis  Name Primary?  . TTP (thrombotic thrombocytopenic purpura) Yes     Interim History:   Followup visit for this 72 year old woman with multiply relapsed TTP currently back in remission.    She was initially diagnosed in January 2005 when she presented with transient neurologic deficits. She was treated with plasma exchange and steroids. She was initially given antiplatelet agents aspirin and Persantine at [facility] in [place]. When she achieved a partial remission she was sent to [facility] for further management.  She relapsed in November 2006. She was treated again with plasma exchange steroids, and then with Rituxan when she failed to achieve a complete response with the steroids and plasma exchange. She was started on a brief trial of Imuran which had to be stopped when she developed line sepsis. 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  She relapsed for a second time in October 2011 and once again was treated with steroids and plasma exchange. Once again, attempts to taper and stop plasma exchange resulted in a fall in her platelet count. She was given a second course of Rituxan. She was started on Imuran initially at a dose of 100 mg daily then escalating to 150 mg.. daily. Imuran was continued through November 2012 when she was hospitalized on an emergency basis for fever, confusion, and back pain and found to have enterococcal bacteremia, and an epidural abscess, and endocarditis. She received a prolonged course of antibiotics with gentamicin and ampicillin. She underwent initial emergency lumbar laminectomy. (March 17 2011) and evacuation of epidural abscess.. She had a minor fall in her platelet count when she was septic. There was no  evidence that she was having any relapse of her TTP however.  She remains in remission at this time. Immunosuppressive drugs were never resumed.  She's had no interim medical problems. She continues to smoke and I continue to lecture her about this. She denies any headache or change in vision, no focal weakness, no slurred speech.  Medications: reviewed  Allergies:  Allergies  Allergen Reactions  . Adhesive (Tape)     blisters  . Other     Wool: Reaction is hives Advertising account executive tape: Reaction is blistering   . Sulfa Antibiotics Hives  . Sulfa Drugs Cross Reactors     Review of Systems: Constitutional:   No constitutional symptoms Respiratory: Dyspnea on exertion Cardiovascular:  No chest pain or palpitations Gastrointestinal: No change in bowel habit Genito-Urinary: No urinary tract symptoms, no hematuria Musculoskeletal: Chronic arthritis pain in her back Neurologic: See above Skin: No rash or ecchymoses Remaining ROS negative.  Physical Exam: Blood pressure 132/72, pulse 93, temperature 97.6 F (36.4 C), temperature source Oral, resp. rate 20, height 5\' 4"  (1.626 m), weight 167 lb (75.751 kg). Wt Readings from Last 3 Encounters:  05/04/12 167 lb (75.751 kg)  01/05/12 159 lb 6.4 oz (72.303 kg)  08/29/11 166 lb 6.4 oz (75.479 kg)     General appearance: Well-nourished Caucasian woman HENNT: Pharynx no erythema or exudate Lymph nodes: No adenopathy Breasts: Lungs: Scattered coarse rhonchi right hemithorax, resonant to percussion Heart: regular rhythm no murmur Abdomen: Soft, nontender, no mass, no organomegaly Extremities: No edema, no calf tenderness Vascular: No cyanosis Neurologic: Mental status intact, PERRLA, optic disc sharp and vessels normal, motor strength  5 over 5, reflexes 2+ symmetric Skin: No rash or ecchymosis  Lab Results: Lab Results  Component Value Date   WBC 7.4 05/04/2012   HGB 15.9 05/04/2012   HCT 46.4 05/04/2012   MCV 93.2  05/04/2012   PLT 146 05/04/2012     Chemistry      Component Value Date/Time   NA 143 05/04/2012 1055   NA 140 11/22/2011 1340   K 5.3* 05/04/2012 1055   K 4.4 11/22/2011 1340   CL 105 05/04/2012 1055   CL 105 11/22/2011 1340   CO2 28 05/04/2012 1055   CO2 26 11/22/2011 1340   BUN 27.0* 05/04/2012 1055   BUN 22 11/22/2011 1340   CREATININE 1.2* 05/04/2012 1055   CREATININE 1.23* 11/22/2011 1340   CREATININE 1.52* 05/05/2011 1117      Component Value Date/Time   CALCIUM 10.9* 05/04/2012 1055   CALCIUM 10.2 11/22/2011 1340   ALKPHOS 54 05/04/2012 1055   ALKPHOS 50 11/22/2011 1340   AST 19 05/04/2012 1055   AST 18 11/22/2011 1340   ALT 14 05/04/2012 1055   ALT 16 11/22/2011 1340   BILITOT 0.45 05/04/2012 1055   BILITOT 0.5 11/22/2011 1340       Radiological Studies: No results found.  Impression and Plan: #1. Multiply relapsed TTP currently in third remission. Counts remained stable off all immunosuppressive drugs. Plan: I'm going to decrease frequency of blood testing to every 3 months.  #2. History of epidural abscess with associated enterococcal sepsis and endocarditis in November 2012. Full recovery. No obvious sequela.  #3. Degenerative arthritis of the spine  #4. Tobacco addiction with no incentive to stop  #5. Obstructive airway disease secondary to #4   CC:Marland Kitchen    Levert Feinstein, MD 1/3/20141:54 PM

## 2012-05-08 ENCOUNTER — Telehealth: Payer: Self-pay | Admitting: *Deleted

## 2012-05-08 NOTE — Telephone Encounter (Signed)
Pt notified to stop calcium & potassium supplements since results were a little high.  She had reduced her calcium to one a day but informed to stop altogether.   Will add CMET to next lab in April unless Dr. Cyndie Chime wants sooner.

## 2012-05-08 NOTE — Telephone Encounter (Signed)
Message copied by Sabino Snipes on Tue May 08, 2012  4:52 PM ------      Message from: Levert Feinstein      Created: Mon May 07, 2012  7:04 PM       Call pt  Calcium & potassium running a little hi  Would stop any calcium and potassium  supplemenst ; add repeat CMET next time she is in for a CBC please.

## 2012-05-09 ENCOUNTER — Other Ambulatory Visit: Payer: Self-pay | Admitting: *Deleted

## 2012-05-09 DIAGNOSIS — E875 Hyperkalemia: Secondary | ICD-10-CM

## 2012-05-10 ENCOUNTER — Telehealth: Payer: Self-pay | Admitting: Oncology

## 2012-05-10 ENCOUNTER — Telehealth: Payer: Self-pay

## 2012-05-10 NOTE — Telephone Encounter (Signed)
s/w pt and she is aware of her 1/21 appt

## 2012-05-10 NOTE — Telephone Encounter (Signed)
Ms. Rajagopalan called and left a message stating that she thinks her potassium is a little high due to her drinking carrot juice.

## 2012-05-22 ENCOUNTER — Other Ambulatory Visit (HOSPITAL_BASED_OUTPATIENT_CLINIC_OR_DEPARTMENT_OTHER): Payer: Medicare Other

## 2012-05-22 DIAGNOSIS — M311 Thrombotic microangiopathy, unspecified: Secondary | ICD-10-CM

## 2012-05-22 DIAGNOSIS — Z862 Personal history of diseases of the blood and blood-forming organs and certain disorders involving the immune mechanism: Secondary | ICD-10-CM

## 2012-05-22 DIAGNOSIS — E875 Hyperkalemia: Secondary | ICD-10-CM

## 2012-05-22 LAB — CBC WITH DIFFERENTIAL/PLATELET
BASO%: 0.8 % (ref 0.0–2.0)
LYMPH%: 36.5 % (ref 14.0–49.7)
MCHC: 35.1 g/dL (ref 31.5–36.0)
MCV: 92.8 fL (ref 79.5–101.0)
MONO%: 8.6 % (ref 0.0–14.0)
Platelets: 122 10*3/uL — ABNORMAL LOW (ref 145–400)
RBC: 4.84 10*6/uL (ref 3.70–5.45)

## 2012-05-22 LAB — COMPREHENSIVE METABOLIC PANEL (CC13)
ALT: 15 U/L (ref 0–55)
Alkaline Phosphatase: 64 U/L (ref 40–150)
Glucose: 108 mg/dl — ABNORMAL HIGH (ref 70–99)
Sodium: 142 mEq/L (ref 136–145)
Total Bilirubin: 0.44 mg/dL (ref 0.20–1.20)
Total Protein: 7 g/dL (ref 6.4–8.3)

## 2012-05-24 ENCOUNTER — Telehealth: Payer: Self-pay | Admitting: Oncology

## 2012-05-24 ENCOUNTER — Telehealth: Payer: Self-pay | Admitting: *Deleted

## 2012-05-24 DIAGNOSIS — Z862 Personal history of diseases of the blood and blood-forming organs and certain disorders involving the immune mechanism: Secondary | ICD-10-CM

## 2012-05-24 NOTE — Telephone Encounter (Signed)
Message copied by Sabino Snipes on Thu May 24, 2012 12:18 PM ------      Message from: Levert Feinstein      Created: Wed May 23, 2012  5:01 PM       Call pt: calcium and potassium levels back in normal range.  Cc lab to PCP

## 2012-05-24 NOTE — Telephone Encounter (Signed)
Pt's husband notified of lab results per Dr Cyndie Chime & labs will be routed to PCP

## 2012-05-24 NOTE — Telephone Encounter (Signed)
Message copied by Sabino Snipes on Thu May 24, 2012  1:50 PM ------      Message from: Levert Feinstein      Created: Thu May 24, 2012  1:06 PM       Call pt  Platelets down slightly from 146,000 to 122,000. Let's repeat in 2 weeks

## 2012-05-24 NOTE — Telephone Encounter (Signed)
s.w. pt and advised on Feb appt...pt aware and ok

## 2012-05-24 NOTE — Telephone Encounter (Signed)
Pt's husband notified of decreased platelets & need to return in 2 wks.  POF to schedulers.

## 2012-06-05 ENCOUNTER — Other Ambulatory Visit (HOSPITAL_BASED_OUTPATIENT_CLINIC_OR_DEPARTMENT_OTHER): Payer: Medicare Other | Admitting: Lab

## 2012-06-05 DIAGNOSIS — M311 Thrombotic microangiopathy: Secondary | ICD-10-CM

## 2012-06-05 LAB — CBC & DIFF AND RETIC
BASO%: 0.7 % (ref 0.0–2.0)
EOS%: 5.9 % (ref 0.0–7.0)
LYMPH%: 39.7 % (ref 14.0–49.7)
MCH: 31.9 pg (ref 25.1–34.0)
MCHC: 34.1 g/dL (ref 31.5–36.0)
MONO#: 0.5 10*3/uL (ref 0.1–0.9)
Platelets: 147 10*3/uL (ref 145–400)
RBC: 4.79 10*6/uL (ref 3.70–5.45)
Retic %: 1.9 % (ref 0.70–2.10)
WBC: 5.8 10*3/uL (ref 3.9–10.3)

## 2012-06-05 LAB — CHCC SMEAR

## 2012-06-12 ENCOUNTER — Telehealth: Payer: Self-pay | Admitting: *Deleted

## 2012-06-12 NOTE — Telephone Encounter (Signed)
Message copied by Sabino Snipes on Tue Jun 12, 2012  2:08 PM ------      Message from: Levert Feinstein      Created: Tue Jun 05, 2012  7:44 PM       Call pt CBC good platelets 147,000 ------

## 2012-06-12 NOTE — Telephone Encounter (Signed)
Left message for pt to call back for good report.

## 2012-06-15 ENCOUNTER — Telehealth: Payer: Self-pay | Admitting: *Deleted

## 2012-06-15 NOTE — Telephone Encounter (Signed)
Spoke with pt.  Let her know that her CBC was good and her platelets were 147K.  She appreciated the call

## 2012-06-15 NOTE — Telephone Encounter (Signed)
Message copied by Orbie Hurst on Fri Jun 15, 2012  9:49 AM ------      Message from: Levert Feinstein      Created: Tue Jun 05, 2012  7:44 PM       Call pt CBC good platelets 147,000 ------

## 2012-07-16 ENCOUNTER — Ambulatory Visit: Payer: Self-pay | Admitting: Internal Medicine

## 2012-08-03 ENCOUNTER — Ambulatory Visit: Payer: Self-pay | Admitting: Neurosurgery

## 2012-08-03 ENCOUNTER — Other Ambulatory Visit (HOSPITAL_BASED_OUTPATIENT_CLINIC_OR_DEPARTMENT_OTHER): Payer: Medicare Other | Admitting: Lab

## 2012-08-03 DIAGNOSIS — M311 Thrombotic microangiopathy: Secondary | ICD-10-CM

## 2012-08-03 LAB — CBC & DIFF AND RETIC
BASO%: 0.7 % (ref 0.0–2.0)
Basophils Absolute: 0.1 10*3/uL (ref 0.0–0.1)
EOS%: 4.3 % (ref 0.0–7.0)
HCT: 45.3 % (ref 34.8–46.6)
LYMPH%: 34.5 % (ref 14.0–49.7)
MCH: 31.6 pg (ref 25.1–34.0)
MCHC: 34 g/dL (ref 31.5–36.0)
MCV: 93 fL (ref 79.5–101.0)
MONO%: 7.2 % (ref 0.0–14.0)
NEUT%: 53.3 % (ref 38.4–76.8)
Platelets: 125 10*3/uL — ABNORMAL LOW (ref 145–400)
lymph#: 2.3 10*3/uL (ref 0.9–3.3)

## 2012-08-03 LAB — CREATININE, SERUM: EGFR (African American): 50 — ABNORMAL LOW

## 2012-08-09 ENCOUNTER — Telehealth: Payer: Self-pay | Admitting: *Deleted

## 2012-08-09 ENCOUNTER — Other Ambulatory Visit: Payer: Self-pay | Admitting: *Deleted

## 2012-08-09 DIAGNOSIS — Z862 Personal history of diseases of the blood and blood-forming organs and certain disorders involving the immune mechanism: Secondary | ICD-10-CM

## 2012-08-09 NOTE — Telephone Encounter (Signed)
Message copied by Sabino Snipes on Thu Aug 09, 2012  1:17 PM ------      Message from: Levert Feinstein      Created: Fri Aug 03, 2012  6:22 PM       Call pt  Platelets 125,000 but Hb 15 so likely random fluctuation - repeat CBC 1 month ------

## 2012-08-09 NOTE — Telephone Encounter (Signed)
Discussed recent labs with [name] & will repeat lab in 1 mo.  POF to schedulers.

## 2012-08-13 ENCOUNTER — Telehealth: Payer: Self-pay | Admitting: Oncology

## 2012-08-13 NOTE — Telephone Encounter (Signed)
Added lb for 5/5. S/w pt she is aware.

## 2012-08-17 ENCOUNTER — Other Ambulatory Visit: Payer: Medicare Other | Admitting: Lab

## 2012-09-03 ENCOUNTER — Other Ambulatory Visit (HOSPITAL_BASED_OUTPATIENT_CLINIC_OR_DEPARTMENT_OTHER): Payer: Medicare Other | Admitting: Lab

## 2012-09-03 DIAGNOSIS — Z862 Personal history of diseases of the blood and blood-forming organs and certain disorders involving the immune mechanism: Secondary | ICD-10-CM

## 2012-09-03 DIAGNOSIS — M311 Thrombotic microangiopathy: Secondary | ICD-10-CM

## 2012-09-03 LAB — CBC WITH DIFFERENTIAL/PLATELET
Basophils Absolute: 0.1 10*3/uL (ref 0.0–0.1)
EOS%: 3.3 % (ref 0.0–7.0)
Eosinophils Absolute: 0.3 10*3/uL (ref 0.0–0.5)
HCT: 45.1 % (ref 34.8–46.6)
HGB: 15.4 g/dL (ref 11.6–15.9)
MCH: 30.9 pg (ref 25.1–34.0)
MCV: 90.5 fL (ref 79.5–101.0)
MONO%: 7.5 % (ref 0.0–14.0)
NEUT#: 4.3 10*3/uL (ref 1.5–6.5)
NEUT%: 56.3 % (ref 38.4–76.8)
Platelets: 151 10*3/uL (ref 145–400)

## 2012-09-06 ENCOUNTER — Telehealth: Payer: Self-pay | Admitting: *Deleted

## 2012-09-06 NOTE — Telephone Encounter (Signed)
Message copied by Sabino Snipes on Thu Sep 06, 2012  4:34 PM ------      Message from: [name]      Created: Tue Sep 04, 2012 12:42 PM       Call pt - counts great - platelets 151,000  Hb 15.2 ------

## 2012-09-13 ENCOUNTER — Observation Stay: Payer: Self-pay | Admitting: Internal Medicine

## 2012-09-13 LAB — CBC WITH DIFFERENTIAL/PLATELET
Basophil #: 0.1 10*3/uL (ref 0.0–0.1)
Basophil %: 1 %
Eosinophil #: 0.3 10*3/uL (ref 0.0–0.7)
Eosinophil %: 4.2 %
Lymphocyte #: 3.2 10*3/uL (ref 1.0–3.6)
MCHC: 35.4 g/dL (ref 32.0–36.0)
MCV: 90 fL (ref 80–100)
Monocyte #: 0.6 x10 3/mm (ref 0.2–0.9)
Monocyte %: 8.9 %
Neutrophil #: 3.1 10*3/uL (ref 1.4–6.5)
Neutrophil %: 42.4 %
Platelet: 141 10*3/uL — ABNORMAL LOW (ref 150–440)
RDW: 13.3 % (ref 11.5–14.5)

## 2012-09-13 LAB — COMPREHENSIVE METABOLIC PANEL
Anion Gap: 5 — ABNORMAL LOW (ref 7–16)
Bilirubin,Total: 0.3 mg/dL (ref 0.2–1.0)
Calcium, Total: 9.5 mg/dL (ref 8.5–10.1)
Creatinine: 0.94 mg/dL (ref 0.60–1.30)
EGFR (African American): >60
EGFR (Non-African Amer.): >60
Osmolality: 283 (ref 275–301)
SGPT (ALT): 26 U/L (ref 12–78)
Sodium: 140 mmol/L (ref 136–145)
Total Protein: 7.1 g/dL (ref 6.4–8.2)

## 2012-09-13 LAB — PROTIME-INR
INR: 0.9
Prothrombin Time: 12.7 secs (ref 11.5–14.7)

## 2012-09-14 ENCOUNTER — Telehealth: Payer: Self-pay

## 2012-09-14 DIAGNOSIS — I359 Nonrheumatic aortic valve disorder, unspecified: Secondary | ICD-10-CM

## 2012-09-14 LAB — LIPID PANEL
Cholesterol: 147 mg/dL (ref 0–200)
HDL Cholesterol: 31 mg/dL — ABNORMAL LOW (ref 40–60)
Triglycerides: 471 mg/dL — ABNORMAL HIGH (ref 0–200)

## 2012-09-14 LAB — HEMOGLOBIN A1C: Hemoglobin A1C: 5.4 % (ref 4.2–6.3)

## 2012-09-14 NOTE — Telephone Encounter (Signed)
Told Mr. Cade that Lonna Cobb NP.  spoke with Dr. Truett Perna regarding taking the ASA 81 mg.  He said for her to continue the ASA through the weekend and will see what Dr. Cyndie Chime recommends upon his return.  Husband understood and agreed with plan.

## 2012-09-14 NOTE — Telephone Encounter (Signed)
Dr. Dareen Piano called to speak with Dr. Cyndie Chime about [name].  She was admitted to [facility] yesterday for facial drooping.  Pt. Had TIA.  MRI showed no stenosis.  Symptoms resolved.  He anted her to have ASA81 mg daily or Plavix.  Patient and family told him that Dr. Cyndie Chime said not to use ASA or Plavix.  [name] hs had ASA 81 mg daily x 2 in the hospital.  He would like her to be on ASA but will have Dr. Cyndie Chime decide what is appropriate and call her at home today with  Instructions. Gave information to Krystal Cobb NP.  She stated she would review with Dr. Truett Perna as Dr. Cyndie Chime is out of the office today.

## 2012-09-19 ENCOUNTER — Telehealth: Payer: Self-pay | Admitting: *Deleted

## 2012-09-19 NOTE — Telephone Encounter (Signed)
Message copied by Orbie Hurst on Wed Sep 19, 2012  3:03 PM ------      Message from: Levert Feinstein      Created: Tue Sep 04, 2012 12:42 PM       Call pt - counts great - platelets 151,000  Hb 15.2 ------

## 2012-09-19 NOTE — Telephone Encounter (Signed)
Called patient and let her know that it is fine to stay on ASA 81mg  as her platelets are stable.  Patient appreciated the call back.

## 2012-09-19 NOTE — Telephone Encounter (Signed)
Spoke with husband as wife is asleep.  Let him know that her counts from 09/03/12 were great.  Plat. 151K and Hgb 15.2.  He appreciated the call.

## 2012-11-05 ENCOUNTER — Other Ambulatory Visit (HOSPITAL_BASED_OUTPATIENT_CLINIC_OR_DEPARTMENT_OTHER): Payer: Medicare Other | Admitting: Lab

## 2012-11-05 ENCOUNTER — Ambulatory Visit (HOSPITAL_BASED_OUTPATIENT_CLINIC_OR_DEPARTMENT_OTHER): Payer: Medicare Other | Admitting: Nurse Practitioner

## 2012-11-05 ENCOUNTER — Telehealth: Payer: Self-pay | Admitting: Oncology

## 2012-11-05 VITALS — BP 112/60 | HR 90 | Temp 97.4°F | Resp 18 | Ht 64.0 in | Wt 170.8 lb

## 2012-11-05 DIAGNOSIS — M311 Thrombotic microangiopathy: Secondary | ICD-10-CM

## 2012-11-05 DIAGNOSIS — Z862 Personal history of diseases of the blood and blood-forming organs and certain disorders involving the immune mechanism: Secondary | ICD-10-CM

## 2012-11-05 LAB — COMPREHENSIVE METABOLIC PANEL (CC13)
Albumin: 3.8 g/dL (ref 3.5–5.0)
BUN: 18.5 mg/dL (ref 7.0–26.0)
CO2: 30 mEq/L — ABNORMAL HIGH (ref 22–29)
Calcium: 9.7 mg/dL (ref 8.4–10.4)
Glucose: 108 mg/dl (ref 70–140)
Potassium: 4.6 mEq/L (ref 3.5–5.1)
Sodium: 143 mEq/L (ref 136–145)
Total Protein: 6.8 g/dL (ref 6.4–8.3)

## 2012-11-05 LAB — CBC & DIFF AND RETIC
Basophils Absolute: 0 10*3/uL (ref 0.0–0.1)
EOS%: 3.8 % (ref 0.0–7.0)
Eosinophils Absolute: 0.3 10*3/uL (ref 0.0–0.5)
LYMPH%: 37.2 % (ref 14.0–49.7)
MCH: 31.6 pg (ref 25.1–34.0)
MCV: 92.1 fL (ref 79.5–101.0)
MONO%: 7.1 % (ref 0.0–14.0)
NEUT#: 3.5 10*3/uL (ref 1.5–6.5)
Platelets: 136 10*3/uL — ABNORMAL LOW (ref 145–400)
RBC: 4.78 10*6/uL (ref 3.70–5.45)
RDW: 13.4 % (ref 11.2–14.5)
Retic %: 1.73 % (ref 0.70–2.10)
Retic Ct Abs: 82.69 10*3/uL (ref 33.70–90.70)

## 2012-11-05 NOTE — Progress Notes (Signed)
OFFICE PROGRESS NOTE  Interval history:   [name] is a 72 year old woman with chronic relapsing TTP. She was initially diagnosed in January 2005 at which time she presented with transient neurologic deficits. She was treated with plasma exchange and steroids. She was initially given antiplatelet agents aspirin and Persantine at [facility] in [place]. When she achieved a partial remission her care was transferred to [facility] for further management. She relapsed in November 2006 and was again treated with plasma exchange and steroids. Rituxan was added when she failed to achieve a complete response. She was started on a brief trial of Imuran which had to be stopped when she developed line sepsis. She relapsed for a second time in October 2011 and was again treated with steroids and plasma exchange. Attempts to taper and stop the plasma exchange resulted in a fall in the platelet count. She was given a second course of Rituxan and was started on Imuran initially at a dose of 100 mg daily then escalated to 150 mg daily. Imuran was continued through November 2012 when she was hospitalized for fever, confusion and back pain and found to have enterococcal bacteremia an epidural abscess and endocarditis. She received a prolonged course of antibiotics with gentamicin and ampicillin. She underwent an initial emergency lumbar laminectomy and evacuation of the epidural abscess. She had a minor fall in the platelet count when she was septic. There was however no evidence that the TTP was relapsing. There was complete recovery of the platelet count with control of the sepsis.   She is seen today for scheduled followup. No interim illnesses or infections. No unusual headaches. She notes blurry vision of the left eye. This has worsened over the past year. She has an upcoming appointment with her eye doctor. No diplopia. No unusual headaches. She has a good appetite. Her weight is stable. No nausea or vomiting.  No focal weakness. She continues to smoke.   Objective: Blood pressure 112/60, pulse 90, temperature 97.4 F (36.3 C), temperature source Oral, resp. rate 18, height 5\' 4"  (1.626 m), weight 170 lb 12.8 oz (77.474 kg).  Pupils equal round and reactive to light. Extraocular movements intact. Sclera anicteric. Oropharynx is without thrush or ulceration. Lungs are clear. No wheezes or rales. Regular cardiac rhythm. Abdomen is soft and nontender. No organomegaly. Extremities are without edema. Calves soft and nontender. Motor strength 5 over 5. Knee DTRs 2+, symmetric.  Lab Results: Lab Results  Component Value Date   WBC 6.8 11/05/2012   HGB 15.1 11/05/2012   HCT 44.0 11/05/2012   MCV 92.1 11/05/2012   PLT 136* 11/05/2012    Chemistry:    Chemistry      Component Value Date/Time   NA 143 11/05/2012 1321   NA 140 11/22/2011 1340   K 4.6 11/05/2012 1321   K 4.4 11/22/2011 1340   CL 107 05/22/2012 1023   CL 105 11/22/2011 1340   CO2 30* 11/05/2012 1321   CO2 26 11/22/2011 1340   BUN 18.5 11/05/2012 1321   BUN 22 11/22/2011 1340   CREATININE 1.2* 11/05/2012 1321   CREATININE 1.23* 11/22/2011 1340   CREATININE 1.52* 05/05/2011 1117      Component Value Date/Time   CALCIUM 9.7 11/05/2012 1321   CALCIUM 10.2 11/22/2011 1340   ALKPHOS 68 11/05/2012 1321   ALKPHOS 50 11/22/2011 1340   AST 20 11/05/2012 1321   AST 18 11/22/2011 1340   ALT 17 11/05/2012 1321   ALT 16 11/22/2011 1340  BILITOT 0.47 11/05/2012 1321   BILITOT 0.5 11/22/2011 1340       Studies/Results: No results found.  Medications: I have reviewed the patient's current medications.  Assessment/Plan:  1. Multiply relapsed TTP. 2. 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 3. Chronic degenerative arthritis of the spine. 4. Continued tobacco use. 5. Obstructive airway disease.  Disposition-Ms. Wernli remains in remission from the TTP. We will continue to check labs on a monthly basis. She will return for a followup visit  in 6 months. She will contact the office in the interim with any problems.  Plan reviewed with Dr. Cyndie Chime.  Lonna Cobb ANP/GNP-BC

## 2012-12-03 ENCOUNTER — Other Ambulatory Visit: Payer: Medicare Other | Admitting: Lab

## 2012-12-05 ENCOUNTER — Other Ambulatory Visit: Payer: Self-pay | Admitting: *Deleted

## 2012-12-05 ENCOUNTER — Telehealth: Payer: Self-pay | Admitting: Oncology

## 2012-12-05 NOTE — Telephone Encounter (Signed)
S/w pt re lb appt for 8/7 @ 10:30am. appt r/s from 8/4 per 8/6 pof.

## 2012-12-06 ENCOUNTER — Other Ambulatory Visit (HOSPITAL_BASED_OUTPATIENT_CLINIC_OR_DEPARTMENT_OTHER): Payer: Medicare Other

## 2012-12-06 DIAGNOSIS — M311 Thrombotic microangiopathy, unspecified: Secondary | ICD-10-CM

## 2012-12-06 DIAGNOSIS — Z862 Personal history of diseases of the blood and blood-forming organs and certain disorders involving the immune mechanism: Secondary | ICD-10-CM

## 2012-12-06 LAB — CBC & DIFF AND RETIC
Basophils Absolute: 0.1 10*3/uL (ref 0.0–0.1)
Eosinophils Absolute: 0.3 10*3/uL (ref 0.0–0.5)
HGB: 15.7 g/dL (ref 11.6–15.9)
Immature Retic Fract: 6.3 % (ref 1.60–10.00)
MONO#: 0.6 10*3/uL (ref 0.1–0.9)
MONO%: 7.5 % (ref 0.0–14.0)
NEUT#: 3.7 10*3/uL (ref 1.5–6.5)
RBC: 5.02 10*6/uL (ref 3.70–5.45)
RDW: 13.2 % (ref 11.2–14.5)
Retic %: 1.6 % (ref 0.70–2.10)
Retic Ct Abs: 80.32 10*3/uL (ref 33.70–90.70)
WBC: 7.5 10*3/uL (ref 3.9–10.3)
lymph#: 3 10*3/uL (ref 0.9–3.3)

## 2012-12-06 LAB — CHCC SMEAR

## 2012-12-11 ENCOUNTER — Telehealth: Payer: Self-pay | Admitting: *Deleted

## 2012-12-11 NOTE — Telephone Encounter (Signed)
Message copied by Sabino Snipes on Tue Dec 11, 2012 11:01 AM ------      Message from: Levert Feinstein      Created: Thu Dec 06, 2012 11:45 AM       Call pt counts stable - platelets 133,000 ------

## 2012-12-11 NOTE — Telephone Encounter (Signed)
Called & spoke with pt's husband & report of platelet result from 12/06/12 given per Dr Patsy Lager instructions.

## 2013-01-01 ENCOUNTER — Other Ambulatory Visit (HOSPITAL_BASED_OUTPATIENT_CLINIC_OR_DEPARTMENT_OTHER): Payer: Medicare Other | Admitting: Lab

## 2013-01-01 DIAGNOSIS — M311 Thrombotic microangiopathy, unspecified: Secondary | ICD-10-CM

## 2013-01-01 LAB — CBC & DIFF AND RETIC
BASO%: 0.8 % (ref 0.0–2.0)
EOS%: 4.7 % (ref 0.0–7.0)
HCT: 43.2 % (ref 34.8–46.6)
Immature Retic Fract: 12 % — ABNORMAL HIGH (ref 1.60–10.00)
LYMPH%: 38.1 % (ref 14.0–49.7)
MCH: 31.1 pg (ref 25.1–34.0)
MCHC: 33.8 g/dL (ref 31.5–36.0)
MCV: 92.1 fL (ref 79.5–101.0)
MONO%: 9.3 % (ref 0.0–14.0)
NEUT%: 47.1 % (ref 38.4–76.8)
Platelets: 137 10*3/uL — ABNORMAL LOW (ref 145–400)
RBC: 4.69 10*6/uL (ref 3.70–5.45)
WBC: 6.5 10*3/uL (ref 3.9–10.3)
nRBC: 0 % (ref 0–0)

## 2013-01-07 ENCOUNTER — Telehealth: Payer: Self-pay | Admitting: *Deleted

## 2013-01-07 NOTE — Telephone Encounter (Signed)
Message copied by Sabino Snipes on Mon Jan 07, 2013  1:32 PM ------      Message from: Levert Feinstein      Created: Thu Jan 03, 2013  4:33 PM       Call pt lab stable platelets 137,000 ------

## 2013-01-07 NOTE — Telephone Encounter (Signed)
Pt returned call from last week & left vm to call back this am.  Returned her call & informed of platelet resuts.

## 2013-01-28 ENCOUNTER — Ambulatory Visit (HOSPITAL_BASED_OUTPATIENT_CLINIC_OR_DEPARTMENT_OTHER): Payer: Medicare Other | Admitting: Lab

## 2013-01-28 DIAGNOSIS — M311 Thrombotic microangiopathy: Secondary | ICD-10-CM

## 2013-01-28 LAB — CBC & DIFF AND RETIC
Basophils Absolute: 0 10*3/uL (ref 0.0–0.1)
Eosinophils Absolute: 0.3 10*3/uL (ref 0.0–0.5)
HCT: 43.6 % (ref 34.8–46.6)
HGB: 14.7 g/dL (ref 11.6–15.9)
MCH: 30.9 pg (ref 25.1–34.0)
MCV: 91.6 fL (ref 79.5–101.0)
MONO%: 8 % (ref 0.0–14.0)
NEUT#: 3 10*3/uL (ref 1.5–6.5)
NEUT%: 49 % (ref 38.4–76.8)
Platelets: 126 10*3/uL — ABNORMAL LOW (ref 145–400)
RDW: 13 % (ref 11.2–14.5)
Retic Ct Abs: 92.82 10*3/uL — ABNORMAL HIGH (ref 33.70–90.70)

## 2013-01-29 ENCOUNTER — Other Ambulatory Visit: Payer: Medicare Other

## 2013-02-01 ENCOUNTER — Other Ambulatory Visit: Payer: Self-pay | Admitting: *Deleted

## 2013-02-01 ENCOUNTER — Telehealth: Payer: Self-pay | Admitting: *Deleted

## 2013-02-01 DIAGNOSIS — Z862 Personal history of diseases of the blood and blood-forming organs and certain disorders involving the immune mechanism: Secondary | ICD-10-CM

## 2013-02-01 NOTE — Telephone Encounter (Signed)
Pt notified of platelet count & informed in previous range & to cont monthly cbc & retic.  POF done.

## 2013-02-01 NOTE — Telephone Encounter (Signed)
Message copied by Sabino Snipes on Fri Feb 01, 2013  5:23 PM ------      Message from: Levert Feinstein      Created: Tue Jan 29, 2013  6:34 PM       Call pt platelets 126,000 within range of previous values.  Repeat CBC, retic Q month ------

## 2013-02-26 ENCOUNTER — Other Ambulatory Visit (HOSPITAL_BASED_OUTPATIENT_CLINIC_OR_DEPARTMENT_OTHER): Payer: Medicare Other | Admitting: Lab

## 2013-02-26 DIAGNOSIS — M311 Thrombotic microangiopathy, unspecified: Secondary | ICD-10-CM

## 2013-02-26 DIAGNOSIS — Z862 Personal history of diseases of the blood and blood-forming organs and certain disorders involving the immune mechanism: Secondary | ICD-10-CM

## 2013-02-26 LAB — CBC & DIFF AND RETIC
Basophils Absolute: 0 10*3/uL (ref 0.0–0.1)
Eosinophils Absolute: 0.3 10*3/uL (ref 0.0–0.5)
HCT: 43.2 % (ref 34.8–46.6)
HGB: 14.5 g/dL (ref 11.6–15.9)
Immature Retic Fract: 6.6 % (ref 1.60–10.00)
MCV: 91.7 fL (ref 79.5–101.0)
NEUT#: 2.6 10*3/uL (ref 1.5–6.5)
NEUT%: 45.9 % (ref 38.4–76.8)
RDW: 13.2 % (ref 11.2–14.5)
Retic Ct Abs: 91.37 10*3/uL — ABNORMAL HIGH (ref 33.70–90.70)
lymph#: 2.4 10*3/uL (ref 0.9–3.3)

## 2013-03-06 ENCOUNTER — Telehealth: Payer: Self-pay | Admitting: *Deleted

## 2013-03-06 NOTE — Telephone Encounter (Signed)
Message copied by Orbie Hurst on Wed Mar 06, 2013  1:37 PM ------      Message from: Levert Feinstein      Created: Tue Feb 26, 2013  3:31 PM       Call pt lab stable platelets 136,000 ------

## 2013-03-06 NOTE — Telephone Encounter (Signed)
Spoke with husband and let him know that platelets are stable at 136K.  He appreciated the phone call.

## 2013-03-26 ENCOUNTER — Other Ambulatory Visit (HOSPITAL_BASED_OUTPATIENT_CLINIC_OR_DEPARTMENT_OTHER): Payer: Medicare Other | Admitting: Lab

## 2013-03-26 DIAGNOSIS — Z862 Personal history of diseases of the blood and blood-forming organs and certain disorders involving the immune mechanism: Secondary | ICD-10-CM

## 2013-03-26 LAB — CBC & DIFF AND RETIC
BASO%: 0.9 % (ref 0.0–2.0)
EOS%: 4.3 % (ref 0.0–7.0)
Eosinophils Absolute: 0.3 10*3/uL (ref 0.0–0.5)
Immature Retic Fract: 8.5 % (ref 1.60–10.00)
LYMPH%: 43.4 % (ref 14.0–49.7)
MCHC: 34.8 g/dL (ref 31.5–36.0)
MCV: 91.3 fL (ref 79.5–101.0)
MONO#: 0.5 10*3/uL (ref 0.1–0.9)
NEUT#: 3 10*3/uL (ref 1.5–6.5)
Platelets: 140 10*3/uL — ABNORMAL LOW (ref 145–400)
RBC: 4.82 10*6/uL (ref 3.70–5.45)
RDW: 13.3 % (ref 11.2–14.5)
Retic %: 1.93 % (ref 0.70–2.10)
WBC: 6.8 10*3/uL (ref 3.9–10.3)
lymph#: 3 10*3/uL (ref 0.9–3.3)
nRBC: 0 % (ref 0–0)

## 2013-03-27 ENCOUNTER — Telehealth: Payer: Self-pay | Admitting: *Deleted

## 2013-03-27 NOTE — Telephone Encounter (Signed)
Message left on pt's vm with husband's voice of lab results per Dr Cyndie Chime & instructed to call if questions.

## 2013-03-27 NOTE — Telephone Encounter (Signed)
Message copied by Sabino Snipes on Wed Mar 27, 2013  5:22 PM ------      Message from: Levert Feinstein      Created: Wed Mar 27, 2013  3:10 PM       Call pt CBC stable  Platelets 140,000 Hb 15 ------

## 2013-04-23 ENCOUNTER — Other Ambulatory Visit (HOSPITAL_BASED_OUTPATIENT_CLINIC_OR_DEPARTMENT_OTHER): Payer: Medicare Other

## 2013-04-23 DIAGNOSIS — Z862 Personal history of diseases of the blood and blood-forming organs and certain disorders involving the immune mechanism: Secondary | ICD-10-CM

## 2013-04-23 LAB — CBC & DIFF AND RETIC
BASO%: 1.1 % (ref 0.0–2.0)
EOS%: 6 % (ref 0.0–7.0)
HCT: 42.8 % (ref 34.8–46.6)
Immature Retic Fract: 6.9 % (ref 1.60–10.00)
LYMPH%: 44.7 % (ref 14.0–49.7)
MCH: 31.4 pg (ref 25.1–34.0)
MCHC: 33.9 g/dL (ref 31.5–36.0)
MCV: 92.6 fL (ref 79.5–101.0)
NEUT%: 39.4 % (ref 38.4–76.8)
Platelets: 120 10*3/uL — ABNORMAL LOW (ref 145–400)
RBC: 4.62 10*6/uL (ref 3.70–5.45)
Retic %: 1.89 % (ref 0.70–2.10)
WBC: 5.7 10*3/uL (ref 3.9–10.3)
lymph#: 2.6 10*3/uL (ref 0.9–3.3)

## 2013-04-24 ENCOUNTER — Telehealth: Payer: Self-pay | Admitting: *Deleted

## 2013-04-24 NOTE — Telephone Encounter (Signed)
Called & spoke with pt's husband & informed of plt count & pt will return in @ 2 weeks.

## 2013-04-24 NOTE — Telephone Encounter (Signed)
Message copied by Sabino Snipes on Wed Apr 24, 2013 11:37 AM ------      Message from: Levert Feinstein      Created: Wed Apr 24, 2013 10:18 AM       Call pt: platelet count 120,000.  Let's repeat in 2 weeks ------

## 2013-05-10 ENCOUNTER — Ambulatory Visit (HOSPITAL_BASED_OUTPATIENT_CLINIC_OR_DEPARTMENT_OTHER): Payer: Medicare Other | Admitting: Oncology

## 2013-05-10 ENCOUNTER — Other Ambulatory Visit (HOSPITAL_BASED_OUTPATIENT_CLINIC_OR_DEPARTMENT_OTHER): Payer: Medicare Other

## 2013-05-10 ENCOUNTER — Telehealth: Payer: Self-pay | Admitting: Oncology

## 2013-05-10 ENCOUNTER — Encounter: Payer: Self-pay | Admitting: Oncology

## 2013-05-10 VITALS — BP 135/54 | HR 86 | Temp 98.2°F | Resp 18 | Ht 64.0 in | Wt 177.6 lb

## 2013-05-10 DIAGNOSIS — Z720 Tobacco use: Secondary | ICD-10-CM

## 2013-05-10 DIAGNOSIS — M311 Thrombotic microangiopathy, unspecified: Secondary | ICD-10-CM

## 2013-05-10 DIAGNOSIS — Z862 Personal history of diseases of the blood and blood-forming organs and certain disorders involving the immune mechanism: Secondary | ICD-10-CM

## 2013-05-10 DIAGNOSIS — J988 Other specified respiratory disorders: Secondary | ICD-10-CM

## 2013-05-10 DIAGNOSIS — M479 Spondylosis, unspecified: Secondary | ICD-10-CM

## 2013-05-10 DIAGNOSIS — F17200 Nicotine dependence, unspecified, uncomplicated: Secondary | ICD-10-CM | POA: Insufficient documentation

## 2013-05-10 HISTORY — DX: Tobacco use: Z72.0

## 2013-05-10 LAB — CBC & DIFF AND RETIC
BASO%: 1 % (ref 0.0–2.0)
BASOS ABS: 0.1 10*3/uL (ref 0.0–0.1)
EOS ABS: 0.4 10*3/uL (ref 0.0–0.5)
EOS%: 6.2 % (ref 0.0–7.0)
HCT: 42.6 % (ref 34.8–46.6)
HGB: 14.4 g/dL (ref 11.6–15.9)
Immature Retic Fract: 10 % (ref 1.60–10.00)
LYMPH%: 42.4 % (ref 14.0–49.7)
MCH: 31.2 pg (ref 25.1–34.0)
MCHC: 33.8 g/dL (ref 31.5–36.0)
MCV: 92.4 fL (ref 79.5–101.0)
MONO#: 0.5 10*3/uL (ref 0.1–0.9)
MONO%: 8.5 % (ref 0.0–14.0)
NEUT%: 41.9 % (ref 38.4–76.8)
NEUTROS ABS: 2.5 10*3/uL (ref 1.5–6.5)
Platelets: 126 10*3/uL — ABNORMAL LOW (ref 145–400)
RBC: 4.61 10*6/uL (ref 3.70–5.45)
RDW: 13.4 % (ref 11.2–14.5)
RETIC CT ABS: 103.73 10*3/uL — AB (ref 33.70–90.70)
Retic %: 2.25 % — ABNORMAL HIGH (ref 0.70–2.10)
WBC: 6 10*3/uL (ref 3.9–10.3)
lymph#: 2.6 10*3/uL (ref 0.9–3.3)

## 2013-05-10 MED ORDER — VARENICLINE TARTRATE 0.5 MG PO TABS: 0.5000 mg | ORAL_TABLET | Freq: Two times a day (BID) | ORAL | Status: AC

## 2013-05-10 NOTE — Progress Notes (Signed)
Hematology and Oncology Follow Up Visit  [name] [id number] Mar 29, 1941 73 y.o. 05/10/2013 12:12 PM   Principle Diagnosis: Encounter Diagnoses  Name Primary?  . History of TTP (thrombotic thrombocytopenic purpura) Yes  . Tobacco abuse      Interim History:   Followup visit for this 73 year old woman with TTP in second remission. She was initially diagnosed in January 2005 when she presented with transient neurologic deficits. She was treated with plasma exchange and steroids. She was initially given antiplatelet agents aspirin and Persantine at [facility] in [place]. When she achieved a partial remission she was sent to [facility] for further management.  She relapsed in November 2006. She was treated again with plasma exchange steroids, and then with Rituxan when she failed to achieve a complete response with the steroids and plasma exchange. She was started on a brief trial of Imuran which had to be stopped when she developed line sepsis. 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  She relapsed for a second time in October 2011 and once again was treated with steroids and plasma exchange. Once again, attempts to taper and stop plasma exchange resulted in a fall in her platelet count. She was given a second course of Rituxan. She was started on Imuran initially at a dose of 100 mg daily then escalating to 150 mg.. daily. Imuran was continued through November 2012 when she was hospitalized on an emergency basis for fever, confusion, and back pain and found to have enterococcal bacteremia, and an epidural abscess, and endocarditis. She received a prolonged course of antibiotics with gentamicin and ampicillin. She underwent initial emergency lumbar laminectomy. (March 17 2011) and evacuation of epidural abscess.. She had a minor fall in her platelet count when she was septic. There was no  evidence that she was having any relapse of her TTP however.  She remains in remission at this time. Immunosuppressive drugs were never resumed.  Her platelet count has been in the 120-140,000 range over the last 2 years without any trend for further decrease. Hemoglobin has been normal. Reticulocyte count, bilirubin, LDH normal.  She's had no interim medical problems. She denies any headache, change in vision, slurred speech, focal weakness, or paresthesias. No interim infections. She is now a grandmother. Her daughter just had her first child. She continues to smoke.   Medications: reviewed  Allergies:  Allergies  Allergen Reactions  . Adhesive [Tape]     blisters  . Cefuroxime Axetil     "jittery"  . Other     Wool: Reaction is hives Statistician tape: Reaction is blistering   . Sulfa Antibiotics Hives  . Sulfa Drugs Cross Reactors     Review of Systems: Hematology: No bruising or bleeding ENT ROS: No sore throat Breast ROS:  Respiratory ROS: No cough or dyspnea Cardiovascular ROS: No chest pain or palpitations Gastrointestinal ROS:  No abdominal pain or change in bowel habit Genito-Urinary ROS: Not questioned Musculoskeletal ROS chronic arthritis pain Neurological ROS: No headache or change in vision. See above Dermatological ROS: No rash or ecchymosis Remaining ROS negative.  Physical Exam: Blood pressure 135/54, pulse 86, temperature 98.2 F (36.8 C), temperature source Oral, resp. rate 18, height 5\' 4"  (1.626 m), weight 177 lb 9.6 oz (80.559 kg). Wt Readings from Last 3 Encounters:  05/10/13 177 lb 9.6 oz (80.559 kg)  11/05/12 170 lb 12.8 oz (77.474 kg)  05/04/12 167  lb (75.751 kg)     General appearance: Well-nourished Caucasian woman HENNT: Pharynx no erythema, exudate, mass, or ulcer. No thyromegaly or thyroid nodules Lymph nodes: No cervical, supraclavicular, or axillary lymphadenopathy Breasts: Lungs: Clear to auscultation, resonant to  percussion throughout Heart: Regular rhythm, no murmur, no gallop, no rub, no click, no edema Abdomen: Soft, nontender, normal bowel sounds, no mass, no organomegaly Extremities: No edema, no calf tenderness Musculoskeletal: no joint deformities GU:  Vascular: Carotid pulses 2+,  Neurologic: Alert, oriented, PERRLA, optic discs sharp and vessels normal, no hemorrhage or exudate, cranial nerves grossly normal, motor strength 5 over 5, reflexes 1+ symmetric, upper body coordination normal, gait normal, Skin: No rash or ecchymosis  Lab Results: CBC W/Diff    Component Value Date/Time   WBC 6.0 05/10/2013 1100   WBC 4.9 05/05/2011 1117   WBC 8.6 11/09/2007 1100   RBC 4.61 05/10/2013 1100   RBC 3.83* 05/05/2011 1117   RBC 2.82* 05/27/2010 0720   HGB 14.4 05/10/2013 1100   HGB 11.6* 05/05/2011 1117   HGB 14.4 11/09/2007 1100   HCT 42.6 05/10/2013 1100   HCT 35.1* 05/05/2011 1117   HCT 42.6 11/09/2007 1100   PLT 126* 05/10/2013 1100   PLT 141* 05/05/2011 1117   PLT 233 11/09/2007 1100   MCV 92.4 05/10/2013 1100   MCV 91.6 05/05/2011 1117   MCV 90 11/09/2007 1100   MCH 31.2 05/10/2013 1100   MCH 30.3 05/05/2011 1117   MCH 30.3 11/09/2007 1100   MCHC 33.8 05/10/2013 1100   MCHC 33.0 05/05/2011 1117   MCHC 33.7 11/09/2007 1100   RDW 13.4 05/10/2013 1100   RDW 14.2 05/05/2011 1117   RDW 12.5 11/09/2007 1100   LYMPHSABS 2.6 05/10/2013 1100   LYMPHSABS 2.0 05/05/2011 1117   LYMPHSABS 2.5 11/09/2007 1100   MONOABS 0.5 05/10/2013 1100   MONOABS 0.3 05/05/2011 1117   EOSABS 0.4 05/10/2013 1100   EOSABS 0.3 05/05/2011 1117   EOSABS 0.1 11/09/2007 1100   BASOSABS 0.1 05/10/2013 1100   BASOSABS 0.1 05/05/2011 1117   BASOSABS 0.1 11/09/2007 1100     Chemistry      Component Value Date/Time   NA 143 11/05/2012 1321   NA 140 11/22/2011 1340   K 4.6 11/05/2012 1321   K 4.4 11/22/2011 1340   CL 107 05/22/2012 1023   CL 105 11/22/2011 1340   CO2 30* 11/05/2012 1321   CO2 26 11/22/2011 1340   BUN 18.5 11/05/2012 1321   BUN 22 11/22/2011 1340    CREATININE 1.2* 11/05/2012 1321   CREATININE 1.23* 11/22/2011 1340   CREATININE 1.52* 05/05/2011 1117      Component Value Date/Time   CALCIUM 9.7 11/05/2012 1321   CALCIUM 10.2 11/22/2011 1340   ALKPHOS 68 11/05/2012 1321   ALKPHOS 50 11/22/2011 1340   AST 20 11/05/2012 1321   AST 18 11/22/2011 1340   ALT 17 11/05/2012 1321   ALT 16 11/22/2011 1340   BILITOT 0.47 11/05/2012 1321   BILITOT 0.5 11/22/2011 1340       .  Impression:  #1. TTP in second remission. No signs of relapse now over 3 years. I am sending blood counts on a monthly basis. We should be able to decrease frequency of labs to every 2-3 months.  #2. History of epidural abscess with associated enterococcal sepsis and endocarditis in November 2012.  Full recovery. No obvious sequela.   #3. Degenerative arthritis of the spine   #4. Tobacco addiction She is  willing to try to take another course of Chantix. I gave her a prescription for this.  #5. Obstructive airway disease secondary to #4      CC: Patient Care Team: Kirk Ruths, MD as PCP - General (Unknown Physician Specialty)   Annia Belt, MD 1/9/201512:12 PM

## 2013-05-10 NOTE — Telephone Encounter (Signed)
gv and printed appt sched an davs for pt for pt for Feb thru July.Krystal KitchenMarland Olson

## 2013-06-04 ENCOUNTER — Other Ambulatory Visit (HOSPITAL_BASED_OUTPATIENT_CLINIC_OR_DEPARTMENT_OTHER): Payer: Medicare Other

## 2013-06-04 DIAGNOSIS — Z862 Personal history of diseases of the blood and blood-forming organs and certain disorders involving the immune mechanism: Secondary | ICD-10-CM

## 2013-06-04 LAB — COMPREHENSIVE METABOLIC PANEL (CC13)
ALK PHOS: 69 U/L (ref 40–150)
ALT: 20 U/L (ref 0–55)
AST: 22 U/L (ref 5–34)
Albumin: 4.1 g/dL (ref 3.5–5.0)
Anion Gap: 10 mEq/L (ref 3–11)
BILIRUBIN TOTAL: 0.44 mg/dL (ref 0.20–1.20)
BUN: 32 mg/dL — ABNORMAL HIGH (ref 7.0–26.0)
CO2: 23 mEq/L (ref 22–29)
Calcium: 9.8 mg/dL (ref 8.4–10.4)
Chloride: 106 mEq/L (ref 98–109)
Creatinine: 1.2 mg/dL — ABNORMAL HIGH (ref 0.6–1.1)
GLUCOSE: 104 mg/dL (ref 70–140)
POTASSIUM: 4.5 meq/L (ref 3.5–5.1)
Sodium: 139 mEq/L (ref 136–145)
Total Protein: 7 g/dL (ref 6.4–8.3)

## 2013-06-04 LAB — CBC & DIFF AND RETIC
BASO%: 0.5 % (ref 0.0–2.0)
Basophils Absolute: 0 10*3/uL (ref 0.0–0.1)
EOS ABS: 0.4 10*3/uL (ref 0.0–0.5)
EOS%: 5.3 % (ref 0.0–7.0)
HEMATOCRIT: 43.1 % (ref 34.8–46.6)
HGB: 14.7 g/dL (ref 11.6–15.9)
Immature Retic Fract: 8.5 % (ref 1.60–10.00)
LYMPH%: 43 % (ref 14.0–49.7)
MCH: 31.1 pg (ref 25.1–34.0)
MCHC: 34.1 g/dL (ref 31.5–36.0)
MCV: 91.3 fL (ref 79.5–101.0)
MONO#: 0.6 10*3/uL (ref 0.1–0.9)
MONO%: 7.7 % (ref 0.0–14.0)
NEUT#: 3.2 10*3/uL (ref 1.5–6.5)
NEUT%: 43.5 % (ref 38.4–76.8)
Platelets: 118 10*3/uL — ABNORMAL LOW (ref 145–400)
RBC: 4.72 10*6/uL (ref 3.70–5.45)
RDW: 13 % (ref 11.2–14.5)
Retic %: 1.83 % (ref 0.70–2.10)
Retic Ct Abs: 86.38 10*3/uL (ref 33.70–90.70)
WBC: 7.3 10*3/uL (ref 3.9–10.3)
lymph#: 3.2 10*3/uL (ref 0.9–3.3)

## 2013-06-04 LAB — LACTATE DEHYDROGENASE (CC13): LDH: 256 U/L — AB (ref 125–245)

## 2013-06-11 ENCOUNTER — Telehealth: Payer: Self-pay | Admitting: *Deleted

## 2013-06-11 NOTE — Telephone Encounter (Signed)
Message copied by Jesse Fall on Tue Jun 11, 2013 11:07 AM ------      Message from: Annia Belt      Created: Wed Jun 05, 2013 10:37 AM       Call pt:  Platelets a little lower at 118,000 but within range of previous values.  Hb remains normal @ 14.7 ------

## 2013-06-11 NOTE — Telephone Encounter (Signed)
Talked with pt's husband & informed of lab results per Dr Azucena Freed instructions.

## 2013-07-01 ENCOUNTER — Encounter: Payer: Self-pay | Admitting: Oncology

## 2013-07-02 ENCOUNTER — Other Ambulatory Visit (HOSPITAL_BASED_OUTPATIENT_CLINIC_OR_DEPARTMENT_OTHER): Payer: Medicare Other

## 2013-07-02 ENCOUNTER — Other Ambulatory Visit: Payer: Self-pay | Admitting: Oncology

## 2013-07-02 DIAGNOSIS — Z862 Personal history of diseases of the blood and blood-forming organs and certain disorders involving the immune mechanism: Secondary | ICD-10-CM

## 2013-07-02 DIAGNOSIS — M311 Thrombotic microangiopathy, unspecified: Secondary | ICD-10-CM

## 2013-07-02 LAB — CBC & DIFF AND RETIC
BASO%: 0.7 % (ref 0.0–2.0)
Basophils Absolute: 0 10*3/uL (ref 0.0–0.1)
EOS%: 4.8 % (ref 0.0–7.0)
Eosinophils Absolute: 0.3 10*3/uL (ref 0.0–0.5)
HCT: 43.7 % (ref 34.8–46.6)
HGB: 14.7 g/dL (ref 11.6–15.9)
Immature Retic Fract: 10.3 % — ABNORMAL HIGH (ref 1.60–10.00)
LYMPH%: 42.9 % (ref 14.0–49.7)
MCH: 30.6 pg (ref 25.1–34.0)
MCHC: 33.6 g/dL (ref 31.5–36.0)
MCV: 91 fL (ref 79.5–101.0)
MONO#: 0.5 10*3/uL (ref 0.1–0.9)
MONO%: 8.8 % (ref 0.0–14.0)
NEUT#: 2.4 10*3/uL (ref 1.5–6.5)
NEUT%: 42.8 % (ref 38.4–76.8)
Platelets: 100 10*3/uL — ABNORMAL LOW (ref 145–400)
RBC: 4.8 10*6/uL (ref 3.70–5.45)
RDW: 13.1 % (ref 11.2–14.5)
Retic %: 2.25 % — ABNORMAL HIGH (ref 0.70–2.10)
Retic Ct Abs: 108 10*3/uL — ABNORMAL HIGH (ref 33.70–90.70)
WBC: 5.7 10*3/uL (ref 3.9–10.3)
lymph#: 2.4 10*3/uL (ref 0.9–3.3)

## 2013-07-03 ENCOUNTER — Telehealth: Payer: Self-pay | Admitting: Oncology

## 2013-07-03 ENCOUNTER — Other Ambulatory Visit: Payer: Self-pay | Admitting: Oncology

## 2013-07-03 ENCOUNTER — Other Ambulatory Visit (HOSPITAL_BASED_OUTPATIENT_CLINIC_OR_DEPARTMENT_OTHER): Payer: Medicare Other

## 2013-07-03 ENCOUNTER — Other Ambulatory Visit: Payer: Self-pay | Admitting: *Deleted

## 2013-07-03 DIAGNOSIS — Z862 Personal history of diseases of the blood and blood-forming organs and certain disorders involving the immune mechanism: Secondary | ICD-10-CM

## 2013-07-03 DIAGNOSIS — M311 Thrombotic microangiopathy, unspecified: Secondary | ICD-10-CM

## 2013-07-03 DIAGNOSIS — M3119 Other thrombotic microangiopathy: Secondary | ICD-10-CM

## 2013-07-03 LAB — COMPREHENSIVE METABOLIC PANEL (CC13)
ALT: 16 U/L (ref 0–55)
ANION GAP: 9 meq/L (ref 3–11)
AST: 20 U/L (ref 5–34)
Albumin: 4.1 g/dL (ref 3.5–5.0)
Alkaline Phosphatase: 71 U/L (ref 40–150)
BUN: 21.9 mg/dL (ref 7.0–26.0)
CALCIUM: 10.1 mg/dL (ref 8.4–10.4)
CHLORIDE: 108 meq/L (ref 98–109)
CO2: 27 meq/L (ref 22–29)
Creatinine: 1.1 mg/dL (ref 0.6–1.1)
Glucose: 123 mg/dl (ref 70–140)
Potassium: 4.7 mEq/L (ref 3.5–5.1)
SODIUM: 144 meq/L (ref 136–145)
TOTAL PROTEIN: 7.2 g/dL (ref 6.4–8.3)
Total Bilirubin: 0.38 mg/dL (ref 0.20–1.20)

## 2013-07-03 LAB — CBC & DIFF AND RETIC
BASO%: 0.6 % (ref 0.0–2.0)
BASOS ABS: 0 10*3/uL (ref 0.0–0.1)
EOS ABS: 0.4 10*3/uL (ref 0.0–0.5)
EOS%: 5.2 % (ref 0.0–7.0)
HCT: 43.2 % (ref 34.8–46.6)
HGB: 14.6 g/dL (ref 11.6–15.9)
Immature Retic Fract: 6.3 % (ref 1.60–10.00)
LYMPH%: 36.4 % (ref 14.0–49.7)
MCH: 31 pg (ref 25.1–34.0)
MCHC: 33.8 g/dL (ref 31.5–36.0)
MCV: 91.7 fL (ref 79.5–101.0)
MONO#: 0.4 10*3/uL (ref 0.1–0.9)
MONO%: 6.2 % (ref 0.0–14.0)
NEUT%: 51.6 % (ref 38.4–76.8)
NEUTROS ABS: 3.7 10*3/uL (ref 1.5–6.5)
PLATELETS: 104 10*3/uL — AB (ref 145–400)
RBC: 4.71 10*6/uL (ref 3.70–5.45)
RDW: 13.2 % (ref 11.2–14.5)
Retic %: 2.06 % (ref 0.70–2.10)
Retic Ct Abs: 97.03 10*3/uL — ABNORMAL HIGH (ref 33.70–90.70)
WBC: 7.1 10*3/uL (ref 3.9–10.3)
lymph#: 2.6 10*3/uL (ref 0.9–3.3)

## 2013-07-03 LAB — LACTATE DEHYDROGENASE (CC13): LDH: 272 U/L — ABNORMAL HIGH (ref 125–245)

## 2013-07-03 MED ORDER — PREDNISONE 20 MG PO TABS: ORAL_TABLET | ORAL | Status: DC

## 2013-07-03 NOTE — Telephone Encounter (Signed)
, °

## 2013-07-04 ENCOUNTER — Telehealth: Payer: Self-pay | Admitting: Oncology

## 2013-07-04 LAB — DIRECT ANTIGLOBULIN TEST (NOT AT ARMC)
DAT (Complement): NEGATIVE
DAT IgG: NEGATIVE

## 2013-07-05 ENCOUNTER — Ambulatory Visit (HOSPITAL_BASED_OUTPATIENT_CLINIC_OR_DEPARTMENT_OTHER): Payer: Medicare Other

## 2013-07-05 ENCOUNTER — Other Ambulatory Visit: Payer: Self-pay | Admitting: Oncology

## 2013-07-05 ENCOUNTER — Ambulatory Visit (HOSPITAL_BASED_OUTPATIENT_CLINIC_OR_DEPARTMENT_OTHER): Payer: Medicare Other | Admitting: Oncology

## 2013-07-05 ENCOUNTER — Other Ambulatory Visit: Payer: Medicare Other

## 2013-07-05 ENCOUNTER — Other Ambulatory Visit: Payer: Self-pay | Admitting: *Deleted

## 2013-07-05 ENCOUNTER — Other Ambulatory Visit (HOSPITAL_BASED_OUTPATIENT_CLINIC_OR_DEPARTMENT_OTHER): Payer: Medicare Other

## 2013-07-05 VITALS — BP 120/46 | HR 91 | Temp 98.7°F | Resp 16

## 2013-07-05 DIAGNOSIS — Z862 Personal history of diseases of the blood and blood-forming organs and certain disorders involving the immune mechanism: Secondary | ICD-10-CM

## 2013-07-05 DIAGNOSIS — M311 Thrombotic microangiopathy, unspecified: Secondary | ICD-10-CM

## 2013-07-05 DIAGNOSIS — Z5112 Encounter for antineoplastic immunotherapy: Secondary | ICD-10-CM

## 2013-07-05 DIAGNOSIS — M3119 Other thrombotic microangiopathy: Secondary | ICD-10-CM

## 2013-07-05 LAB — MORPHOLOGY
PLT EST: DECREASED
RBC Comments: NONE SEEN

## 2013-07-05 LAB — CBC & DIFF AND RETIC
BASO%: 0.2 % (ref 0.0–2.0)
Basophils Absolute: 0 10*3/uL (ref 0.0–0.1)
EOS%: 0 % (ref 0.0–7.0)
Eosinophils Absolute: 0 10*3/uL (ref 0.0–0.5)
HCT: 42.1 % (ref 34.8–46.6)
HGB: 14.5 g/dL (ref 11.6–15.9)
Immature Retic Fract: 7.1 % (ref 1.60–10.00)
LYMPH%: 19.8 % (ref 14.0–49.7)
MCH: 31.3 pg (ref 25.1–34.0)
MCHC: 34.4 g/dL (ref 31.5–36.0)
MCV: 90.7 fL (ref 79.5–101.0)
MONO#: 0.9 10*3/uL (ref 0.1–0.9)
MONO%: 7.2 % (ref 0.0–14.0)
NEUT#: 9 10*3/uL — ABNORMAL HIGH (ref 1.5–6.5)
NEUT%: 72.8 % (ref 38.4–76.8)
PLATELETS: 112 10*3/uL — AB (ref 145–400)
RBC: 4.64 10*6/uL (ref 3.70–5.45)
RDW: 13.3 % (ref 11.2–14.5)
RETIC %: 2.29 % — AB (ref 0.70–2.10)
Retic Ct Abs: 106.26 10*3/uL — ABNORMAL HIGH (ref 33.70–90.70)
WBC: 12.3 10*3/uL — AB (ref 3.9–10.3)
lymph#: 2.4 10*3/uL (ref 0.9–3.3)

## 2013-07-05 LAB — CHCC SMEAR

## 2013-07-05 MED ORDER — DIPHENHYDRAMINE HCL 25 MG PO CAPS
ORAL_CAPSULE | ORAL | Status: AC
  Filled 2013-07-05: qty 2

## 2013-07-05 MED ORDER — SODIUM CHLORIDE 0.9 % IV SOLN
375.0000 mg/m2 | Freq: Once | INTRAVENOUS | Status: AC
  Administered 2013-07-05: 700 mg via INTRAVENOUS
  Filled 2013-07-05: qty 70

## 2013-07-05 MED ORDER — ACETAMINOPHEN 325 MG PO TABS
650.0000 mg | ORAL_TABLET | Freq: Once | ORAL | Status: AC
  Administered 2013-07-05: 650 mg via ORAL

## 2013-07-05 MED ORDER — RITUXIMAB CHEMO INJECTION 10 MG/ML: 375.0000 mg/m2 | Freq: Once | INTRAVENOUS | Status: DC

## 2013-07-05 MED ORDER — DIPHENHYDRAMINE HCL 25 MG PO CAPS
50.0000 mg | ORAL_CAPSULE | Freq: Once | ORAL | Status: AC
  Administered 2013-07-05: 50 mg via ORAL

## 2013-07-05 MED ORDER — SODIUM CHLORIDE 0.9 % IV SOLN
Freq: Once | INTRAVENOUS | Status: AC
  Administered 2013-07-05: 13:00:00 via INTRAVENOUS

## 2013-07-05 MED ORDER — ACETAMINOPHEN 325 MG PO TABS
ORAL_TABLET | ORAL | Status: AC
  Filled 2013-07-05: qty 2

## 2013-07-05 NOTE — Patient Instructions (Signed)
[facility] Discharge Instructions for Patients Receiving Chemotherapy  Today you received the following chemotherapy agent: Rituxan   To help prevent nausea and vomiting after your treatment, we encourage you to take your nausea medication as prescribed.    If you develop nausea and vomiting that is not controlled by your nausea medication, call the clinic.   BELOW ARE SYMPTOMS THAT SHOULD BE REPORTED IMMEDIATELY:  *FEVER GREATER THAN 100.5 F  *CHILLS WITH OR WITHOUT FEVER  NAUSEA AND VOMITING THAT IS NOT CONTROLLED WITH YOUR NAUSEA MEDICATION  *UNUSUAL SHORTNESS OF BREATH  *UNUSUAL BRUISING OR BLEEDING  TENDERNESS IN MOUTH AND THROAT WITH OR WITHOUT PRESENCE OF ULCERS  *URINARY PROBLEMS  *BOWEL PROBLEMS  UNUSUAL RASH Items with * indicate a potential emergency and should be followed up as soon as possible.  Feel free to call the clinic you have any questions or concerns. The clinic phone number is [phone].    

## 2013-07-05 NOTE — Progress Notes (Signed)
Hematology and Oncology Follow Up Visit  [name] [id number] 04-15-1941 73 y.o. 07/05/2013 5:47 PM   Principle Diagnosis: Encounter Diagnosis  Name Primary?  . TTP (thrombotic thrombocytopenic purpura) Yes     Interim History:  Work in visit for this 73 year old woman who initially presented with neurologic symptoms as the first sign of TTP in January 2005. She was induced into remission with plasma exchange and steroids. She relapsed in August 2006. She again achieved remission with plasma exchange and steroids. She was given consolidation treatment with Rituxan anti-B cell antibody. She relapsed again in October 2011. She was treated with plasma exchange, steroids, and Rituxan. She was initially started on Imuran in attempt to prevent further relapse but was only on the drug for about a month when she was admitted to the hospital with severe back pain in November 2012 and was found to have an epidural abscess requiring emergency decompressive laminectomy at L1-L4 and evacuation of the epidural abscess. Imuran was stopped and not resumed.  She has been monitored with monthly blood counts. She never had a complete platelet recovery subsequent to the epidural abscess the platelet count has been consistently above 100,000 and hemoglobin normalized. She came in for routine blood counts this Tuesday, 07/02/2013 and platelet count was 100,000 with a trend for slow decrease over the last 4 months from peak value of 140,000 on 03/26/2013. Hemoglobin normal at 14.7 g. I had her come back in to check bilirubin LDH and reticulocyte count. Reticulocyte count 2.2%. Total bilirubin 0.4. LDH 272 lab normal up to 272. Value of 256 on 06/04/2013. Review of the peripheral blood showed spherocytes but no schistocytes. Platelets mildly decreased. I was concerned that she was experiencing an early relapse of her TTP. She was advised to start on prednisone 60 mg daily. I made arrangements to begin another course  of Rituxan 375 mg per meter squared weekly for the next 4 weeks. She came in to get her first dose today.  She has not had any headache, change in vision, slurred speech, focal weakness, or seizure activity. No recent infections. No fever. No cough.  Medications: reviewed  Allergies:  Allergies  Allergen Reactions  . Adhesive [Tape]     blisters  . Cefuroxime Axetil     "jittery"  . Other     Wool: Reaction is hives Statistician tape: Reaction is blistering   . Sulfa Antibiotics Hives  . Sulfa Drugs Cross Reactors     Review of Systems: Hematology:  No bleeding or bruising ENT ROS: No sore throat Breast ROS:  Respiratory ROS: See above Cardiovascular ROS:  No chest pain or palpitations Gastrointestinal ROS:  No abdominal pain or change in bowel habit  Genito-Urinary ROS:  Not questioned Musculoskeletal ROS: Chronic back pain Neurological ROS: No headache see above Dermatological ROS: No rash or ecchymosis Remaining ROS negative:   Physical Exam: There were no vitals taken for this visit. Wt Readings from Last 3 Encounters:  05/10/13 177 lb 9.6 oz (80.559 kg)  11/05/12 170 lb 12.8 oz (77.474 kg)  05/04/12 167 lb (75.751 kg)     General appearance: Well-nourished Caucasian woman HENNT: Pharynx no erythema, exudate, mass, or ulcer. No thyromegaly or thyroid nodules Lymph nodes: No cervical, supraclavicular, or axillary lymphadenopathy Breasts:  Lungs: Clear to auscultation, resonant to percussion throughout Heart: Regular rhythm, no murmur, no gallop, no rub, no click, no edema Abdomen: Soft, nontender, normal bowel sounds, no mass, no organomegaly Extremities: No edema, no  calf tenderness Musculoskeletal: no joint deformities GU:  Vascular: Carotid pulses 2+, no bruits,  Neurologic: Alert, oriented, PERRLA,  exudate, cranial nerves grossly normal, motor strength 5 over 5, reflexes 1+ symmetric, upper body coordination normal, gait normal, Skin: No  rash or ecchymosis  Lab Results: CBC W/Diff    Component Value Date/Time   WBC 12.3* 07/05/2013 1200   WBC 4.9 05/05/2011 1117   WBC 8.6 11/09/2007 1100   RBC 4.64 07/05/2013 1200   RBC 3.83* 05/05/2011 1117   RBC 2.82* 05/27/2010 0720   HGB 14.5 07/05/2013 1200   HGB 11.6* 05/05/2011 1117   HGB 14.4 11/09/2007 1100   HCT 42.1 07/05/2013 1200   HCT 35.1* 05/05/2011 1117   HCT 42.6 11/09/2007 1100   PLT 112* 07/05/2013 1200   PLT 141* 05/05/2011 1117   PLT 233 11/09/2007 1100   MCV 90.7 07/05/2013 1200   MCV 91.6 05/05/2011 1117   MCV 90 11/09/2007 1100   MCH 31.3 07/05/2013 1200   MCH 30.3 05/05/2011 1117   MCH 30.3 11/09/2007 1100   MCHC 34.4 07/05/2013 1200   MCHC 33.0 05/05/2011 1117   MCHC 33.7 11/09/2007 1100   RDW 13.3 07/05/2013 1200   RDW 14.2 05/05/2011 1117   RDW 12.5 11/09/2007 1100   LYMPHSABS 2.4 07/05/2013 1200   LYMPHSABS 2.0 05/05/2011 1117   LYMPHSABS 2.5 11/09/2007 1100   MONOABS 0.9 07/05/2013 1200   MONOABS 0.3 05/05/2011 1117   EOSABS 0.0 07/05/2013 1200   EOSABS 0.3 05/05/2011 1117   EOSABS 0.1 11/09/2007 1100   BASOSABS 0.0 07/05/2013 1200   BASOSABS 0.1 05/05/2011 1117   BASOSABS 0.1 11/09/2007 1100     Chemistry      Component Value Date/Time   NA 144 07/03/2013 1055   NA 140 11/22/2011 1340   K 4.7 07/03/2013 1055   K 4.4 11/22/2011 1340   CL 107 05/22/2012 1023   CL 105 11/22/2011 1340   CO2 27 07/03/2013 1055   CO2 26 11/22/2011 1340   BUN 21.9 07/03/2013 1055   BUN 22 11/22/2011 1340   CREATININE 1.1 07/03/2013 1055   CREATININE 1.23* 11/22/2011 1340   CREATININE 1.52* 05/05/2011 1117      Component Value Date/Time   CALCIUM 10.1 07/03/2013 1055   CALCIUM 10.2 11/22/2011 1340   ALKPHOS 71 07/03/2013 1055   ALKPHOS 50 11/22/2011 1340   AST 20 07/03/2013 1055   AST 18 11/22/2011 1340   ALT 16 07/03/2013 1055   ALT 16 11/22/2011 1340   BILITOT 0.38 07/03/2013 1055   BILITOT 0.5 11/22/2011 1340       Impression:  Likely early relapse of TTP Plan: See above. Reinstitution of steroids and Rituxan. If we are  lucky, we will get disease under control without having to resume plasma exchange. When she completes the Rituxan, I will put her back on Imuran since she has declared herself as a multiply relapsing  patient. I will check blood counts weekly.   Rituxan care plan entered into the computer.   CC: Patient Care Team: Kirk Ruths, MD as PCP - General (Unknown Physician Specialty)   Annia Belt, MD 3/6/20155:47 PM

## 2013-07-08 ENCOUNTER — Telehealth: Payer: Self-pay | Admitting: *Deleted

## 2013-07-08 NOTE — Telephone Encounter (Signed)
No adverse event from chemo treatment. Does not like the Prednisone-keeps her wired. Understands to call with any questions or concerns.

## 2013-07-10 ENCOUNTER — Telehealth: Payer: Self-pay | Admitting: Oncology

## 2013-07-10 ENCOUNTER — Telehealth: Payer: Self-pay | Admitting: *Deleted

## 2013-07-10 NOTE — Telephone Encounter (Signed)
, °

## 2013-07-10 NOTE — Telephone Encounter (Signed)
Per staff message and POF I have scheduled appts.  JMW  

## 2013-07-10 NOTE — Telephone Encounter (Signed)
gave pt appt for labs Md and chemo for MArch 2015

## 2013-07-11 LAB — OTHER SOLSTAS TEST

## 2013-07-12 ENCOUNTER — Inpatient Hospital Stay (HOSPITAL_COMMUNITY)
Admission: AD | Admit: 2013-07-12 | Discharge: 2013-07-17 | DRG: 547 | Disposition: A | Discharge status: Home / Self Care | Payer: Medicare Other | Source: Ambulatory Visit | Attending: Oncology | Admitting: Oncology

## 2013-07-12 ENCOUNTER — Inpatient Hospital Stay (HOSPITAL_COMMUNITY): Payer: Medicare Other | Admitting: Certified Registered Nurse Anesthetist

## 2013-07-12 ENCOUNTER — Other Ambulatory Visit: Payer: Self-pay | Admitting: Oncology

## 2013-07-12 ENCOUNTER — Telehealth: Payer: Self-pay | Admitting: *Deleted

## 2013-07-12 ENCOUNTER — Encounter (HOSPITAL_COMMUNITY)
Admission: AD | Disposition: A | Discharge status: Home / Self Care | Payer: Self-pay | Source: Ambulatory Visit | Attending: Oncology

## 2013-07-12 ENCOUNTER — Other Ambulatory Visit: Payer: Self-pay | Admitting: *Deleted

## 2013-07-12 ENCOUNTER — Other Ambulatory Visit (HOSPITAL_BASED_OUTPATIENT_CLINIC_OR_DEPARTMENT_OTHER): Payer: Medicare Other

## 2013-07-12 ENCOUNTER — Encounter (HOSPITAL_COMMUNITY): Payer: Medicare Other | Admitting: Certified Registered Nurse Anesthetist

## 2013-07-12 ENCOUNTER — Inpatient Hospital Stay (HOSPITAL_COMMUNITY): Payer: Medicare Other

## 2013-07-12 ENCOUNTER — Encounter (HOSPITAL_COMMUNITY): Payer: Self-pay | Admitting: Certified Registered Nurse Anesthetist

## 2013-07-12 ENCOUNTER — Ambulatory Visit (HOSPITAL_BASED_OUTPATIENT_CLINIC_OR_DEPARTMENT_OTHER): Payer: Medicare Other

## 2013-07-12 VITALS — BP 128/63 | HR 72 | Temp 97.1°F | Resp 18

## 2013-07-12 DIAGNOSIS — J449 Chronic obstructive pulmonary disease, unspecified: Secondary | ICD-10-CM | POA: Diagnosis present

## 2013-07-12 DIAGNOSIS — J988 Other specified respiratory disorders: Secondary | ICD-10-CM

## 2013-07-12 DIAGNOSIS — M311 Thrombotic microangiopathy, unspecified: Principal | ICD-10-CM | POA: Diagnosis present

## 2013-07-12 DIAGNOSIS — J4489 Other specified chronic obstructive pulmonary disease: Secondary | ICD-10-CM | POA: Diagnosis present

## 2013-07-12 DIAGNOSIS — G609 Hereditary and idiopathic neuropathy, unspecified: Secondary | ICD-10-CM | POA: Diagnosis present

## 2013-07-12 DIAGNOSIS — F3289 Other specified depressive episodes: Secondary | ICD-10-CM | POA: Diagnosis present

## 2013-07-12 DIAGNOSIS — M3119 Other thrombotic microangiopathy: Secondary | ICD-10-CM

## 2013-07-12 DIAGNOSIS — F17200 Nicotine dependence, unspecified, uncomplicated: Secondary | ICD-10-CM | POA: Diagnosis present

## 2013-07-12 DIAGNOSIS — F411 Generalized anxiety disorder: Secondary | ICD-10-CM | POA: Diagnosis present

## 2013-07-12 DIAGNOSIS — Z720 Tobacco use: Secondary | ICD-10-CM

## 2013-07-12 DIAGNOSIS — Z79899 Other long term (current) drug therapy: Secondary | ICD-10-CM

## 2013-07-12 DIAGNOSIS — M479 Spondylosis, unspecified: Secondary | ICD-10-CM | POA: Diagnosis present

## 2013-07-12 DIAGNOSIS — Z862 Personal history of diseases of the blood and blood-forming organs and certain disorders involving the immune mechanism: Secondary | ICD-10-CM

## 2013-07-12 DIAGNOSIS — IMO0002 Reserved for concepts with insufficient information to code with codable children: Secondary | ICD-10-CM

## 2013-07-12 DIAGNOSIS — F341 Dysthymic disorder: Secondary | ICD-10-CM

## 2013-07-12 DIAGNOSIS — F329 Major depressive disorder, single episode, unspecified: Secondary | ICD-10-CM | POA: Diagnosis present

## 2013-07-12 HISTORY — PX: INSERTION OF DIALYSIS CATHETER: SHX1324

## 2013-07-12 LAB — MORPHOLOGY
PLT EST: DECREASED
RBC Comments: NORMAL

## 2013-07-12 LAB — CBC & DIFF AND RETIC
BASO%: 0.2 % (ref 0.0–2.0)
BASOS ABS: 0 10*3/uL (ref 0.0–0.1)
EOS ABS: 0 10*3/uL (ref 0.0–0.5)
EOS%: 0 % (ref 0.0–7.0)
HEMATOCRIT: 44.4 % (ref 34.8–46.6)
HGB: 15.3 g/dL (ref 11.6–15.9)
IMMATURE RETIC FRACT: 9 % (ref 1.60–10.00)
LYMPH%: 9.3 % — ABNORMAL LOW (ref 14.0–49.7)
MCH: 31.1 pg (ref 25.1–34.0)
MCHC: 34.5 g/dL (ref 31.5–36.0)
MCV: 90.2 fL (ref 79.5–101.0)
MONO#: 1.4 10*3/uL — AB (ref 0.1–0.9)
MONO%: 11.5 % (ref 0.0–14.0)
NEUT%: 79 % — AB (ref 38.4–76.8)
NEUTROS ABS: 9.8 10*3/uL — AB (ref 1.5–6.5)
Platelets: 63 10*3/uL — ABNORMAL LOW (ref 145–400)
RBC: 4.92 10*6/uL (ref 3.70–5.45)
RDW: 13.5 % (ref 11.2–14.5)
RETIC %: 3.15 % — AB (ref 0.70–2.10)
RETIC CT ABS: 154.98 10*3/uL — AB (ref 33.70–90.70)
WBC: 12.3 10*3/uL — ABNORMAL HIGH (ref 3.9–10.3)
lymph#: 1.2 10*3/uL (ref 0.9–3.3)
nRBC: 0 % (ref 0–0)

## 2013-07-12 LAB — URINALYSIS, ROUTINE W REFLEX MICROSCOPIC
Bilirubin Urine: NEGATIVE
Glucose, UA: NEGATIVE mg/dL
Hgb urine dipstick: NEGATIVE
Ketones, ur: NEGATIVE mg/dL
Leukocytes, UA: NEGATIVE
NITRITE: NEGATIVE
PROTEIN: NEGATIVE mg/dL
Specific Gravity, Urine: 1.014 (ref 1.005–1.030)
UROBILINOGEN UA: 0.2 mg/dL (ref 0.0–1.0)
pH: 6 (ref 5.0–8.0)

## 2013-07-12 LAB — CHCC SMEAR

## 2013-07-12 SURGERY — INSERTION OF DIALYSIS CATHETER
Anesthesia: Monitor Anesthesia Care | Site: Neck | Laterality: Left

## 2013-07-12 MED ORDER — HYDROMORPHONE HCL PF 1 MG/ML IJ SOLN
0.2500 mg | INTRAMUSCULAR | Status: DC | PRN
Start: 2013-07-12 — End: 2013-07-12

## 2013-07-12 MED ORDER — DEXTROSE 5 % IV SOLN
INTRAVENOUS | Status: AC
  Filled 2013-07-12: qty 1.5

## 2013-07-12 MED ORDER — SODIUM CHLORIDE 0.9 % IV SOLN
4.0000 g | Freq: Once | INTRAVENOUS | Status: AC
  Administered 2013-07-13: 4 g via INTRAVENOUS
  Filled 2013-07-12 (×2): qty 40

## 2013-07-12 MED ORDER — ONDANSETRON HCL 4 MG/2ML IJ SOLN
4.0000 mg | Freq: Four times a day (QID) | INTRAMUSCULAR | Status: DC | PRN
  Administered 2013-07-12: 4 mg via INTRAVENOUS

## 2013-07-12 MED ORDER — METHYLPREDNISOLONE SODIUM SUCC 125 MG IJ SOLR
80.0000 mg | Freq: Every day | INTRAMUSCULAR | Status: DC
  Administered 2013-07-13 – 2013-07-15 (×3): 80 mg via INTRAVENOUS
  Filled 2013-07-12 (×3): qty 1.28

## 2013-07-12 MED ORDER — LIDOCAINE-EPINEPHRINE (PF) 1 %-1:200000 IJ SOLN
INTRAMUSCULAR | Status: DC | PRN
  Administered 2013-07-12: 30 mL

## 2013-07-12 MED ORDER — MIDAZOLAM HCL 2 MG/2ML IJ SOLN
INTRAMUSCULAR | Status: AC
  Filled 2013-07-12: qty 2

## 2013-07-12 MED ORDER — IOHEXOL 300 MG/ML  SOLN
INTRAMUSCULAR | Status: DC | PRN
  Administered 2013-07-12: 50 mL via INTRAVENOUS

## 2013-07-12 MED ORDER — ACETAMINOPHEN 325 MG PO TABS
650.0000 mg | ORAL_TABLET | Freq: Once | ORAL | Status: AC
  Administered 2013-07-12: 650 mg via ORAL

## 2013-07-12 MED ORDER — POLYETHYLENE GLYCOL 3350 17 G PO PACK
17.0000 g | PACK | Freq: Every day | ORAL | Status: DC | PRN
Start: 2013-07-12 — End: 2013-07-14

## 2013-07-12 MED ORDER — VITAMIN B-12 1000 MCG PO TABS: 1000.0000 ug | ORAL_TABLET | Freq: Every day | ORAL | Status: DC

## 2013-07-12 MED ORDER — CALCIUM CARBONATE ANTACID 500 MG PO CHEW
2.0000 | CHEWABLE_TABLET | ORAL | Status: AC
  Administered 2013-07-12: 400 mg via ORAL

## 2013-07-12 MED ORDER — SODIUM CHLORIDE 0.9 % IV SOLN
4.0000 g | Freq: Once | INTRAVENOUS | Status: AC
  Administered 2013-07-12: 4 g via INTRAVENOUS
  Filled 2013-07-12: qty 40

## 2013-07-12 MED ORDER — ACD FORMULA A 0.73-2.45-2.2 GM/100ML VI SOLN
500.0000 mL | Status: DC
Start: 2013-07-13 — End: 2013-07-14
  Filled 2013-07-12: qty 500

## 2013-07-12 MED ORDER — FENTANYL CITRATE 0.05 MG/ML IJ SOLN
INTRAMUSCULAR | Status: DC | PRN
  Administered 2013-07-12: 50 ug via INTRAVENOUS

## 2013-07-12 MED ORDER — TIOTROPIUM BROMIDE MONOHYDRATE 18 MCG IN CAPS
18.0000 ug | ORAL_CAPSULE | Freq: Every day | RESPIRATORY_TRACT | Status: DC
  Administered 2013-07-14: 18 ug via RESPIRATORY_TRACT
  Filled 2013-07-12: qty 5

## 2013-07-12 MED ORDER — ACETAMINOPHEN 325 MG PO TABS
650.0000 mg | ORAL_TABLET | ORAL | Status: DC | PRN
  Filled 2013-07-12: qty 2

## 2013-07-12 MED ORDER — DIPHENHYDRAMINE HCL 25 MG PO CAPS
ORAL_CAPSULE | ORAL | Status: AC
  Filled 2013-07-12: qty 1

## 2013-07-12 MED ORDER — POTASSIUM CHLORIDE IN NACL 20-0.9 MEQ/L-% IV SOLN
INTRAVENOUS | Status: DC
  Administered 2013-07-13 – 2013-07-14 (×3): via INTRAVENOUS
  Filled 2013-07-12 (×5): qty 1000

## 2013-07-12 MED ORDER — SENNA 8.6 MG PO TABS
1.0000 | ORAL_TABLET | Freq: Two times a day (BID) | ORAL | Status: DC
  Administered 2013-07-12 – 2013-07-14 (×4): 8.6 mg via ORAL
  Filled 2013-07-12 (×6): qty 1

## 2013-07-12 MED ORDER — SODIUM CHLORIDE 0.9 % IR SOLN
Status: DC | PRN
  Administered 2013-07-12: 1000 mL

## 2013-07-12 MED ORDER — ACD FORMULA A 0.73-2.45-2.2 GM/100ML VI SOLN
Status: AC
  Administered 2013-07-12: 500 mL
  Filled 2013-07-12: qty 500

## 2013-07-12 MED ORDER — SODIUM CHLORIDE 0.9 % IR SOLN
Status: DC | PRN
  Administered 2013-07-12: 14:00:00

## 2013-07-12 MED ORDER — ADULT MULTIVITAMIN W/MINERALS CH
1.0000 | ORAL_TABLET | Freq: Two times a day (BID) | ORAL | Status: DC
  Administered 2013-07-12 – 2013-07-17 (×10): 1 via ORAL
  Filled 2013-07-12 (×13): qty 1

## 2013-07-12 MED ORDER — CHOLECALCIFEROL 10 MCG (400 UNIT) PO TABS
400.0000 [IU] | ORAL_TABLET | Freq: Every day | ORAL | Status: DC
  Administered 2013-07-13 – 2013-07-14 (×2): 400 [IU] via ORAL
  Filled 2013-07-12 (×2): qty 1

## 2013-07-12 MED ORDER — HEPARIN SODIUM (PORCINE) 1000 UNIT/ML IJ SOLN
INTRAMUSCULAR | Status: DC | PRN
  Administered 2013-07-12: 1000 [IU] via INTRAVENOUS

## 2013-07-12 MED ORDER — FOLIC ACID 1 MG PO TABS
1.0000 mg | ORAL_TABLET | Freq: Every day | ORAL | Status: DC
  Administered 2013-07-13 – 2013-07-14 (×2): 1 mg via ORAL
  Filled 2013-07-12 (×2): qty 1

## 2013-07-12 MED ORDER — MORPHINE SULFATE 2 MG/ML IJ SOLN: 2.0000 mg | INTRAMUSCULAR | Status: DC | PRN

## 2013-07-12 MED ORDER — ACD FORMULA A 0.73-2.45-2.2 GM/100ML VI SOLN
500.0000 mL | Status: DC
  Filled 2013-07-12: qty 500

## 2013-07-12 MED ORDER — FENTANYL CITRATE 0.05 MG/ML IJ SOLN
INTRAMUSCULAR | Status: AC
  Filled 2013-07-12: qty 5

## 2013-07-12 MED ORDER — ONE-DAILY MULTI VITAMINS PO TABS: 1.0000 | ORAL_TABLET | Freq: Two times a day (BID) | ORAL | Status: DC

## 2013-07-12 MED ORDER — MAGNESIUM CITRATE PO SOLN: 1.0000 | Freq: Once | ORAL | Status: AC | PRN

## 2013-07-12 MED ORDER — ONDANSETRON HCL 4 MG/2ML IJ SOLN: 4.0000 mg | Freq: Once | INTRAMUSCULAR | Status: DC | PRN

## 2013-07-12 MED ORDER — QUETIAPINE FUMARATE 25 MG PO TABS
25.0000 mg | ORAL_TABLET | Freq: Every day | ORAL | Status: DC
  Administered 2013-07-12 – 2013-07-13 (×2): 25 mg via ORAL
  Filled 2013-07-12 (×3): qty 1

## 2013-07-12 MED ORDER — ONDANSETRON HCL 4 MG/2ML IJ SOLN
INTRAMUSCULAR | Status: AC
  Administered 2013-07-12: 22:00:00
  Filled 2013-07-12: qty 2

## 2013-07-12 MED ORDER — CALCIUM CARBONATE ANTACID 500 MG PO CHEW
2.0000 | CHEWABLE_TABLET | ORAL | Status: AC
  Administered 2013-07-13: 400 mg via ORAL
  Filled 2013-07-12 (×2): qty 2

## 2013-07-12 MED ORDER — LACTATED RINGERS IV SOLN
INTRAVENOUS | Status: DC | PRN
  Administered 2013-07-12: 14:00:00 via INTRAVENOUS

## 2013-07-12 MED ORDER — DIPHENHYDRAMINE HCL 25 MG PO CAPS
50.0000 mg | ORAL_CAPSULE | Freq: Once | ORAL | Status: AC
  Administered 2013-07-12: 50 mg via ORAL

## 2013-07-12 MED ORDER — ACETAMINOPHEN 325 MG PO TABS
ORAL_TABLET | ORAL | Status: AC
  Filled 2013-07-12: qty 2

## 2013-07-12 MED ORDER — CALCIUM CARBONATE ANTACID 500 MG PO CHEW
CHEWABLE_TABLET | ORAL | Status: AC
  Filled 2013-07-12: qty 2

## 2013-07-12 MED ORDER — ANTICOAGULANT SODIUM CITRATE 4% (200MG/5ML) IV SOLN
5.0000 mL | Freq: Once | Status: AC
  Administered 2013-07-13: 5 mL
  Filled 2013-07-12 (×2): qty 250

## 2013-07-12 MED ORDER — FLUOXETINE HCL 20 MG PO CAPS
20.0000 mg | ORAL_CAPSULE | Freq: Two times a day (BID) | ORAL | Status: DC
  Administered 2013-07-13 – 2013-07-14 (×3): 20 mg via ORAL
  Filled 2013-07-12 (×6): qty 1

## 2013-07-12 MED ORDER — SORBITOL 70 % SOLN: 30.0000 mL | Freq: Every day | Status: DC | PRN

## 2013-07-12 MED ORDER — ANTICOAGULANT SODIUM CITRATE 4% (200MG/5ML) IV SOLN
5.0000 mL | Freq: Once | Status: DC
  Filled 2013-07-12 (×2): qty 250

## 2013-07-12 MED ORDER — DIPHENHYDRAMINE HCL 25 MG PO CAPS
ORAL_CAPSULE | ORAL | Status: AC
  Filled 2013-07-12: qty 2

## 2013-07-12 MED ORDER — PROPOFOL INFUSION 10 MG/ML OPTIME
INTRAVENOUS | Status: DC | PRN
  Administered 2013-07-12: 75 ug/kg/min via INTRAVENOUS

## 2013-07-12 MED ORDER — ACETAMINOPHEN 325 MG PO TABS
ORAL_TABLET | ORAL | Status: AC
  Administered 2013-07-12: 650 mg via ORAL
  Filled 2013-07-12: qty 2

## 2013-07-12 MED ORDER — ONDANSETRON HCL 4 MG PO TABS: 8.0000 mg | ORAL_TABLET | Freq: Four times a day (QID) | ORAL | Status: DC | PRN

## 2013-07-12 MED ORDER — LIDOCAINE HCL (CARDIAC) 20 MG/ML IV SOLN
INTRAVENOUS | Status: AC
  Filled 2013-07-12: qty 5

## 2013-07-12 MED ORDER — VITAMIN B-12 1000 MCG PO TABS
1000.0000 ug | ORAL_TABLET | Freq: Every day | ORAL | Status: DC
  Administered 2013-07-13 – 2013-07-17 (×5): 1000 ug via ORAL
  Filled 2013-07-12 (×5): qty 1

## 2013-07-12 MED ORDER — HYDROCODONE-ACETAMINOPHEN 7.5-325 MG PO TABS
1.0000 | ORAL_TABLET | ORAL | Status: DC | PRN
  Administered 2013-07-12: 1 via ORAL
  Filled 2013-07-12: qty 1

## 2013-07-12 MED ORDER — DIPHENHYDRAMINE HCL 25 MG PO CAPS
25.0000 mg | ORAL_CAPSULE | Freq: Four times a day (QID) | ORAL | Status: DC | PRN
  Administered 2013-07-12: 25 mg via ORAL

## 2013-07-12 MED ORDER — MIDAZOLAM HCL 5 MG/5ML IJ SOLN
INTRAMUSCULAR | Status: DC | PRN
  Administered 2013-07-12: 2 mg via INTRAVENOUS

## 2013-07-12 MED ORDER — DEXTROSE 5 % IV SOLN
1.5000 g | INTRAVENOUS | Status: DC | PRN
  Administered 2013-07-12: 1.5 g via INTRAVENOUS

## 2013-07-12 MED ORDER — PROPOFOL 10 MG/ML IV EMUL
INTRAVENOUS | Status: AC
  Filled 2013-07-12: qty 100

## 2013-07-12 MED ORDER — HEPARIN SODIUM (PORCINE) 1000 UNIT/ML IJ SOLN
INTRAMUSCULAR | Status: AC
  Filled 2013-07-12: qty 1

## 2013-07-12 MED ORDER — ALUM & MAG HYDROXIDE-SIMETH 200-200-20 MG/5ML PO SUSP: 30.0000 mL | Freq: Four times a day (QID) | ORAL | Status: DC | PRN

## 2013-07-12 MED ORDER — ACETAMINOPHEN 325 MG PO TABS: 650.0000 mg | ORAL_TABLET | ORAL | Status: DC | PRN

## 2013-07-12 MED ORDER — GABAPENTIN 400 MG PO CAPS
400.0000 mg | ORAL_CAPSULE | Freq: Three times a day (TID) | ORAL | Status: DC
  Administered 2013-07-12 – 2013-07-14 (×5): 400 mg via ORAL
  Filled 2013-07-12 (×7): qty 1

## 2013-07-12 MED ORDER — DIPHENHYDRAMINE HCL 25 MG PO CAPS: 25.0000 mg | ORAL_CAPSULE | Freq: Four times a day (QID) | ORAL | Status: DC | PRN

## 2013-07-12 SURGICAL SUPPLY — 53 items
ADH SKN CLS APL DERMABOND .7 (GAUZE/BANDAGES/DRESSINGS) ×1
BAG DECANTER FOR FLEXI CONT (MISCELLANEOUS) ×3 IMPLANT
CATH CANNON HEMO 15F 50CM (CATHETERS) IMPLANT
CATH CANNON HEMO 15FR 19 (HEMODIALYSIS SUPPLIES) IMPLANT
CATH CANNON HEMO 15FR 23CM (HEMODIALYSIS SUPPLIES) IMPLANT
CATH CANNON HEMO 15FR 31CM (HEMODIALYSIS SUPPLIES) IMPLANT
CATH CANNON HEMO 15FR 32 (HEMODIALYSIS SUPPLIES) IMPLANT
CATH CANNON HEMO 15FR 32CM (HEMODIALYSIS SUPPLIES) ×3 IMPLANT
CATH STRAIGHT 5FR 65CM (CATHETERS) ×3 IMPLANT
COVER PROBE W GEL 5X96 (DRAPES) ×3 IMPLANT
COVER SURGICAL LIGHT HANDLE (MISCELLANEOUS) ×3 IMPLANT
DERMABOND ADVANCED (GAUZE/BANDAGES/DRESSINGS) ×2
DERMABOND ADVANCED .7 DNX12 (GAUZE/BANDAGES/DRESSINGS) ×1 IMPLANT
DRAPE C-ARM 42X72 X-RAY (DRAPES) ×3 IMPLANT
DRAPE CHEST BREAST 15X10 FENES (DRAPES) ×3 IMPLANT
DRSG OPSITE 4X5.5 SM (GAUZE/BANDAGES/DRESSINGS) ×2 IMPLANT
GAUZE SPONGE 2X2 8PLY STRL LF (GAUZE/BANDAGES/DRESSINGS) IMPLANT
GAUZE SPONGE 4X4 16PLY XRAY LF (GAUZE/BANDAGES/DRESSINGS) ×3 IMPLANT
GLOVE BIOGEL PI IND STRL 7.0 (GLOVE) IMPLANT
GLOVE BIOGEL PI IND STRL 7.5 (GLOVE) ×1 IMPLANT
GLOVE BIOGEL PI INDICATOR 7.0 (GLOVE) ×2
GLOVE BIOGEL PI INDICATOR 7.5 (GLOVE) ×2
GLOVE SS BIOGEL STRL SZ 7 (GLOVE) IMPLANT
GLOVE SS BIOGEL STRL SZ 7.5 (GLOVE) IMPLANT
GLOVE SUPERSENSE BIOGEL SZ 7 (GLOVE) ×2
GLOVE SUPERSENSE BIOGEL SZ 7.5 (GLOVE) ×2
GLOVE SURG SS PI 7.5 STRL IVOR (GLOVE) ×3 IMPLANT
GOWN STRL REUS W/ TWL LRG LVL3 (GOWN DISPOSABLE) ×2 IMPLANT
GOWN STRL REUS W/ TWL XL LVL3 (GOWN DISPOSABLE) ×1 IMPLANT
GOWN STRL REUS W/TWL LRG LVL3 (GOWN DISPOSABLE) ×6
GOWN STRL REUS W/TWL XL LVL3 (GOWN DISPOSABLE) ×3
GUIDEWIRE ANGLED .035X150CM (WIRE) ×2 IMPLANT
KIT BASIN OR (CUSTOM PROCEDURE TRAY) ×3 IMPLANT
KIT ROOM TURNOVER OR (KITS) ×3 IMPLANT
NDL 18GX1X1/2 (RX/OR ONLY) (NEEDLE) ×1 IMPLANT
NDL HYPO 25GX1X1/2 BEV (NEEDLE) ×1 IMPLANT
NEEDLE 18GX1X1/2 (RX/OR ONLY) (NEEDLE) ×3 IMPLANT
NEEDLE HYPO 25GX1X1/2 BEV (NEEDLE) ×3 IMPLANT
NS IRRIG 1000ML POUR BTL (IV SOLUTION) ×3 IMPLANT
PACK SURGICAL SETUP 50X90 (CUSTOM PROCEDURE TRAY) ×3 IMPLANT
PAD ARMBOARD 7.5X6 YLW CONV (MISCELLANEOUS) ×6 IMPLANT
SOAP 2 % CHG 4 OZ (WOUND CARE) ×3 IMPLANT
SPONGE GAUZE 2X2 STER 10/PKG (GAUZE/BANDAGES/DRESSINGS) ×2
SUT ETHILON 3 0 PS 1 (SUTURE) ×3 IMPLANT
SUT VICRYL 4-0 PS2 18IN ABS (SUTURE) ×3 IMPLANT
SYR 20CC LL (SYRINGE) ×6 IMPLANT
SYR 30ML LL (SYRINGE) IMPLANT
SYR 5ML LL (SYRINGE) ×3 IMPLANT
SYR CONTROL 10ML LL (SYRINGE) ×3 IMPLANT
SYRINGE 10CC LL (SYRINGE) ×3 IMPLANT
TOWEL OR 17X24 6PK STRL BLUE (TOWEL DISPOSABLE) ×3 IMPLANT
TOWEL OR 17X26 10 PK STRL BLUE (TOWEL DISPOSABLE) ×3 IMPLANT
WATER STERILE IRR 1000ML POUR (IV SOLUTION) ×3 IMPLANT

## 2013-07-12 NOTE — H&P (Signed)
HISTORY AND PHYSICAL EXAM  CC: Third relapse of TTP  HPI:  73 year old woman who initially presented with neurologic symptoms as the first sign of TTP in January 2005. She was induced into remission with plasma exchange and steroids. She relapsed in August 2006. She again achieved remission with plasma exchange and steroids. She was given consolidation treatment with Rituxan anti-B cell antibody. She relapsed again in October 2011. She was treated with plasma exchange, steroids, and Rituxan. She was initially started on Imuran in attempt to prevent further relapse but was only on the drug for about a month when she was admitted to the hospital with severe back pain in November 2012 and was found to have an epidural abscess requiring emergency decompressive laminectomy at L1-L4 and evacuation of the epidural abscess. Imuran was stopped and not resumed.  She has been monitored with monthly blood counts. She never had a complete platelet recovery subsequent to the epidural abscess but the platelet count has been consistently above 100,000 and hemoglobin normalized. She came in for routine blood counts on  Tuesday, 07/02/2013 and platelet count was 100,000 with a trend for slow decrease over the last 4 months from peak value of 140,000 on 03/26/2013. Hemoglobin normal at 14.7 g. I had her come back in to check bilirubin LDH and reticulocyte count. Reticulocyte count 2.2%. Total bilirubin 0.4. LDH 272, lab normal up to 245. Repeat value of 256 on 06/04/2013. Review of the peripheral blood showed spherocytes but no schistocytes. Platelets mildly decreased. I was concerned that she was experiencing an early relapse of her TTP. She was advised to start on prednisone 60 mg daily. I made arrangements to begin another course of Rituxan 375 mg per meter squared weekly for the next 4 weeks. She received the  first dose on 07/05/2013. She came to our office this morning to receive the second scheduled dose. CBC shows  a further fall in her platelet count down to 63,000. Hemoglobin stable at 15.3. When she was here last week, I obtained lab for ADAM-TS enzyme level. Results returned with an undetectable level consistent with a relapse of her TTP. At this point I feel she needs to be admitted to the hospital to begin  plasma exchange.  She has not had any headache, change in vision, slurred speech, focal weakness, or seizure activity. No recent infections. No fever. No cough.    Past Medical History  Diagnosis Date  . Septicemia 03/2011  . Normal echocardiogram 03/15/11  . History of TTP (thrombotic thrombocytopenic purpura)   . Acute delirium   . Bacteremia 03/2011  . COPD (chronic obstructive pulmonary disease)   . Blood transfusion   . Anemia   . Arthritis   . Chronic back pain   . Spinal stenosis, lumbar   . Blood dyscrasia   . Depression     "mild"  . Tobacco abuse 05/10/2013   No current facility-administered medications on file prior to encounter.   Current Outpatient Prescriptions on File Prior to Encounter  Medication Sig Dispense Refill  . cholecalciferol (VITAMIN D) 400 UNITS TABS Take 400 Units by mouth.      . cyanocobalamin 100 MCG tablet Take 1,000 mcg by mouth daily.      Marland Kitchen FLUoxetine (PROZAC) 20 MG capsule Take 20 mg by mouth 2 (two) times daily.       Marland Kitchen gabapentin (NEURONTIN) 400 MG capsule Take 400 mg by mouth 3 (three) times daily.       Marland Kitchen HYDROcodone-acetaminophen (NORCO) 7.5-325  MG per tablet       . Multiple Vitamin (MULTIVITAMIN) tablet Take 1 tablet by mouth 2 (two) times daily.       . predniSONE (DELTASONE) 20 MG tablet Take 3 daily with food.  100 tablet  2  . QUEtiapine (SEROQUEL) 25 MG tablet Take 25 mg by mouth at bedtime.      . rosuvastatin (CRESTOR) 20 MG tablet Take 1 tablet (20 mg total) by mouth daily at 6 PM.      . tiotropium (SPIRIVA) 18 MCG inhalation capsule Place 18 mcg into inhaler and inhale daily.         Allergies  Allergen Reactions  . Adhesive  [Tape]     blisters  . Cefuroxime Axetil     "jittery"  . Other     Wool: Reaction is hives Statistician tape: Reaction is blistering   . Sulfa Antibiotics Hives  . Sulfa Drugs Cross Reactors    No family history on file. History   Social History  . Marital Status: Married    Spouse Name: N/A    Number of Children: 2  . Years of Education: N/A   Occupational History  . Not on file.   Social History Main Topics  . Smoking status: Smoker -- 2.00 packs/day for 55 years    Quit date:   . Smokeless tobacco: Never Used  . Alcohol Use: Yes     Comment: "social drinker"  . Drug Use: No  . Sexual Activity: Not on file   Other Topics Concern  . Not on file   Social History Narrative  .  homemaker. Recently became a grandmother. Her husband has been very supportive.     ROS: Constitutional: No fevers, sweats, or weight loss. HEENT: No sore throat. No stiff neck. Cardio: No chest pain or palpitations Pulmonary: Chronic dyspnea on exertion from long-term cigarette use. GI: No abdominal pain or change in bowel habit. No hematochezia or melena. Extremities: No extremity swelling. Neurologic:  See above Skin: . No rash or ecchymosis Genitourinary: No urinary tract symptoms. No hematuria   Vitals: There were no vitals filed for this visit. Wt Readings from Last 3 Encounters:  05/10/13 177 lb 9.6 oz (80.559 kg)  11/05/12 170 lb 12.8 oz (77.474 kg)  05/04/12 167 lb (75.751 kg)     PHYSICAL EXAM: General appearance: Well-nourished Caucasian woman Head: Normal Eyes: Normal Throat: No erythema or exudate Neck: Full range of motion Lymph Nodes: No cervical, supraclavicular, or axillary adenopathy Resp: Clear to auscultation, resonant to percussion Breasts:  Cardio: Regular rhythm no murmur gallop or rub Vascular: Carotids 2+, no bruits, no cyanosis GI: Abdomen soft, nontender, no mass, no organomegaly GU: Extremities: No edema, no calf tenderness   Neurologic: She is alert, awake, oriented x3, PERRLA, cranial nerves grossly normal, full extraocular movements motor strength 5 over 5 all extremities, reflexes 3+ symmetric at the knees 1+ symmetric at the biceps, upper body coordination is normal, Skin: No rash or ecchymosis   Labs:   Recent Labs  07/12/13 0836  WBC 12.3*  HGB 15.3  HCT 44.4  PLT 63*   No results found for this basename: NA, K, CL, CO2, GLUCOSE, BUN, CREATININE, CALCIUM,  in the last 72 hours  Images Studies/Results:  No results found.   Problem List: Active Problems: #1 relapsed TTP #2. Obstructive airway disease #3. History of depression #4. History of enterococcus epidural abscess with associated endocarditis #5. Degenerative arthritis of the spine  Assessment/Plan: Multiplly relapsed TTP  Plan: Vascular surgery consultation and placement of a vascular access catheter for plasma exchange. 1 volume plasma exchange. Based on 80 kg weight and 35 mL per kilogram estimated plasma volume for a female, total plasma requirement 2800 mL daily. I anticipate daily exchange x7 and then taper to every other day schedule Postoperative oral steroids while she gets plasma exchange and use parenteral Solu-Medrol prior to each exchange at 1 mg per kilogram weight equals 80 mg IV daily and   Delora Gravatt M 07/12/2013, 10:16 AM

## 2013-07-12 NOTE — Transfer of Care (Signed)
Immediate Anesthesia Transfer of Care Note  Patient: [name]  Procedure(s) Performed: Procedure(s): INSERTION OF DIALYSIS CATHETER   with Ultrasound (Left)  Patient Location: PACU  Anesthesia Type:MAC  Level of Consciousness: awake, alert  and oriented  Airway & Oxygen Therapy: Patient Spontanous Breathing  Post-op Assessment: Report given to PACU RN, Post -op Vital signs reviewed and stable and Patient moving all extremities  Post vital signs: Reviewed and stable  Complications: No apparent anesthesia complications

## 2013-07-12 NOTE — H&P (View-Only) (Signed)
Consult Note  Patient name: [name] MRN: [id number] DOB: 1940-05-19 Sex: female  Consulting Physician:  Dr. Beryle Beams  Reason for Consult: No chief complaint on file.   HISTORY OF PRESENT ILLNESS: This is a very pleasant 73 year old female who was admitted to the hospital today for recurrent TTP.  Her platelet count was 112 on 07/05/2013, and today it had dropped to 63,000.  A plasmapheresis catheter has been requested.  The patient has had multiple catheters on both the right and left side in the past.  Her most recent paresis was approximately 3 years ago.  She has not had any bleeding episodes.  The patient has a history of COPD secondary to smoking.  She continues to smoke 2.5 packs per day.  She has no history of coronary artery disease.  She has undergone emergent decompressive laminectomy for epidural abscess.     Past Medical History  Diagnosis Date  . Septicemia 03/2011  . Normal echocardiogram 03/15/11  . History of TTP (thrombotic thrombocytopenic purpura)   . Acute delirium   . Bacteremia 03/2011  . COPD (chronic obstructive pulmonary disease)   . Blood transfusion   . Anemia   . Arthritis   . Chronic back pain   . Spinal stenosis, lumbar   . Blood dyscrasia   . Depression     "mild"  . Tobacco abuse 05/10/2013    Past Surgical History  Procedure Laterality Date  . Cesarean section  10/09/1976  . Tonsillectomy    . Tubal ligation    . Cataract extraction w/ intraocular lens  implant, bilateral  ~ 2010  . Eye surgery    . Lumbar laminectomy/decompression microdiscectomy  03/16/2011    Procedure: LUMBAR LAMINECTOMY/DECOMPRESSION MICRODISCECTOMY;  Surgeon: Elaina Hoops;  Location: [facility];  Service: Neurosurgery;  Laterality: N/A;  . Tee without cardioversion  03/21/2011    Procedure: TRANSESOPHAGEAL ECHOCARDIOGRAM (TEE);  Surgeon: Laverda Page;  Location: [facility];  Service: Cardiovascular;  Laterality: N/A;  TEE for vegetations      History   Social History  . Marital Status: Married    Spouse Name: N/A    Number of Children: N/A  . Years of Education: N/A   Occupational History  . Not on file.   Social History Main Topics  . Smoking status: Former Smoker -- 2.00 packs/day for 56 years    Quit date: 03/09/2011  . Smokeless tobacco: Never Used  . Alcohol Use: Yes     Comment: "social drinker"  . Drug Use: No  . Sexual Activity: Not on file   Other Topics Concern  . Not on file   Social History Narrative  . No narrative on file    No family history on file.  Allergies as of 07/12/2013 - Review Complete 07/12/2013  Allergen Reaction Noted  . Adhesive [tape]  05/05/2011  . Cefuroxime axetil  11/05/2012  . Other  03/16/2011  . Sulfa antibiotics Hives 03/16/2011  . Sulfa drugs cross reactors  03/16/2011    No current facility-administered medications on file prior to encounter.   Current Outpatient Prescriptions on File Prior to Encounter  Medication Sig Dispense Refill  . cholecalciferol (VITAMIN D) 400 UNITS TABS Take 400 Units by mouth.      . cyanocobalamin 100 MCG tablet Take 1,000 mcg by mouth daily.      Marland Kitchen FLUoxetine (PROZAC) 20 MG capsule Take 20 mg by mouth 2 (two) times daily.       Marland Kitchen  gabapentin (NEURONTIN) 400 MG capsule Take 400 mg by mouth 3 (three) times daily.       . HYDROcodone-acetaminophen (NORCO) 7.5-325 MG per tablet       . Multiple Vitamin (MULTIVITAMIN) tablet Take 1 tablet by mouth 2 (two) times daily.       . predniSONE (DELTASONE) 20 MG tablet Take 3 daily with food.  100 tablet  2  . QUEtiapine (SEROQUEL) 25 MG tablet Take 25 mg by mouth at bedtime.      . rosuvastatin (CRESTOR) 20 MG tablet Take 1 tablet (20 mg total) by mouth daily at 6 PM.      . tiotropium (SPIRIVA) 18 MCG inhalation capsule Place 18 mcg into inhaler and inhale daily.           REVIEW OF SYSTEMS: Cardiovascular: No chest pain, chest pressure, palpitations, orthopnea, or dyspnea on exertion.  No claudication or rest pain,  No history of DVT or phlebitis. Pulmonary: No productive cough, asthma or wheezing. Neurologic: No weakness, paresthesias, aphasia, or amaurosis. No dizziness. Hematologic: No bleeding problems or clotting disorders. Musculoskeletal: No joint pain or joint swelling. Gastrointestinal: No blood in stool or hematemesis Genitourinary: No dysuria or hematuria. Psychiatric:: No history of major depression. Integumentary: No rashes or ulcers. Constitutional: No fever or chills.  PHYSICAL EXAMINATION: General: The patient appears their stated age.  Vital signs are BP 130/62  Pulse 86  Temp(Src) 98.6 F (37 C) (Oral)  Resp 18  SpO2 95% Pulmonary: Respirations are non-labored HEENT:  Multiple healed catheter exit sites bilaterally Musculoskeletal: There are no major deformities.   Neurologic: No focal weakness or paresthesias are detected, Skin: There are no ulcer or rashes noted. Psychiatric: The patient has normal affect. Cardiovascular: There is a regular rate and rhythm without significant murmur appreciated.  Diagnostic Studies: None    Assessment:  Recurrent TTP Plan: I discussed with the patient and her husband that we would place a tunneled plasmapheresis catheter.  All of her questions were answered.     V. Wells Brabham IV, M.D. [facility] Office: [phone] Pager:  [phone] 

## 2013-07-12 NOTE — Telephone Encounter (Signed)
Admitting/[facility]/Eliz notified to set up admission for pt for relapsed TTP today at [facility].

## 2013-07-12 NOTE — Progress Notes (Signed)
TPE #1/7 per Dr. Beryle Beams.

## 2013-07-12 NOTE — Progress Notes (Signed)
I appreciate prompt service by Vascular Surgery. Vascular cath placed by Dr Early in left subclavian position. She is stable with some blood staining on dressing. Plan to proceed with 1st plasma exchange tonight. Blood bank and dialysis unit notified earlier today of anticipated need for exchange.

## 2013-07-12 NOTE — Progress Notes (Signed)
Admitted to room 6N18. Alert and oriented, not in any distress, VSS, oriented to unit and staff. OR to pick patient for dialysis cath placement.

## 2013-07-12 NOTE — Progress Notes (Signed)
Per Dr. Beryle Beams - pt will be direct admit to hospital.  MD assessed pt in infusion room today.

## 2013-07-12 NOTE — Preoperative (Signed)
Beta Blockers   Reason not to administer Beta Blockers:Not Applicable 

## 2013-07-12 NOTE — Consult Note (Signed)
Consult Note  Patient name: [name] MRN: [id number] DOB: 1940-05-19 Sex: female  Consulting Physician:  Dr. Beryle Beams  Reason for Consult: No chief complaint on file.   HISTORY OF PRESENT ILLNESS: This is a very pleasant 73 year old female who was admitted to the hospital today for recurrent TTP.  Her platelet count was 112 on 07/05/2013, and today it had dropped to 63,000.  A plasmapheresis catheter has been requested.  The patient has had multiple catheters on both the right and left side in the past.  Her most recent paresis was approximately 3 years ago.  She has not had any bleeding episodes.  The patient has a history of COPD secondary to smoking.  She continues to smoke 2.5 packs per day.  She has no history of coronary artery disease.  She has undergone emergent decompressive laminectomy for epidural abscess.     Past Medical History  Diagnosis Date  . Septicemia 03/2011  . Normal echocardiogram 03/15/11  . History of TTP (thrombotic thrombocytopenic purpura)   . Acute delirium   . Bacteremia 03/2011  . COPD (chronic obstructive pulmonary disease)   . Blood transfusion   . Anemia   . Arthritis   . Chronic back pain   . Spinal stenosis, lumbar   . Blood dyscrasia   . Depression     "mild"  . Tobacco abuse 05/10/2013    Past Surgical History  Procedure Laterality Date  . Cesarean section  10/09/1976  . Tonsillectomy    . Tubal ligation    . Cataract extraction w/ intraocular lens  implant, bilateral  ~ 2010  . Eye surgery    . Lumbar laminectomy/decompression microdiscectomy  03/16/2011    Procedure: LUMBAR LAMINECTOMY/DECOMPRESSION MICRODISCECTOMY;  Surgeon: Elaina Hoops;  Location: [facility];  Service: Neurosurgery;  Laterality: N/A;  . Tee without cardioversion  03/21/2011    Procedure: TRANSESOPHAGEAL ECHOCARDIOGRAM (TEE);  Surgeon: Laverda Page;  Location: [facility] ENDOSCOPY;  Service: Cardiovascular;  Laterality: N/A;  TEE for vegetations      History   Social History  . Marital Status: Married    Spouse Name: N/A    Number of Children: N/A  . Years of Education: N/A   Occupational History  . Not on file.   Social History Main Topics  . Smoking status: Former Smoker -- 2.00 packs/day for 56 years    Quit date: 03/09/2011  . Smokeless tobacco: Never Used  . Alcohol Use: Yes     Comment: "social drinker"  . Drug Use: No  . Sexual Activity: Not on file   Other Topics Concern  . Not on file   Social History Narrative  . No narrative on file    No family history on file.  Allergies as of 07/12/2013 - Review Complete 07/12/2013  Allergen Reaction Noted  . Adhesive [tape]  05/05/2011  . Cefuroxime axetil  11/05/2012  . Other  03/16/2011  . Sulfa antibiotics Hives 03/16/2011  . Sulfa drugs cross reactors  03/16/2011    No current facility-administered medications on file prior to encounter.   Current Outpatient Prescriptions on File Prior to Encounter  Medication Sig Dispense Refill  . cholecalciferol (VITAMIN D) 400 UNITS TABS Take 400 Units by mouth.      . cyanocobalamin 100 MCG tablet Take 1,000 mcg by mouth daily.      Marland Kitchen FLUoxetine (PROZAC) 20 MG capsule Take 20 mg by mouth 2 (two) times daily.       Marland Kitchen  gabapentin (NEURONTIN) 400 MG capsule Take 400 mg by mouth 3 (three) times daily.       Marland Kitchen HYDROcodone-acetaminophen (NORCO) 7.5-325 MG per tablet       . Multiple Vitamin (MULTIVITAMIN) tablet Take 1 tablet by mouth 2 (two) times daily.       . predniSONE (DELTASONE) 20 MG tablet Take 3 daily with food.  100 tablet  2  . QUEtiapine (SEROQUEL) 25 MG tablet Take 25 mg by mouth at bedtime.      . rosuvastatin (CRESTOR) 20 MG tablet Take 1 tablet (20 mg total) by mouth daily at 6 PM.      . tiotropium (SPIRIVA) 18 MCG inhalation capsule Place 18 mcg into inhaler and inhale daily.           REVIEW OF SYSTEMS: Cardiovascular: No chest pain, chest pressure, palpitations, orthopnea, or dyspnea on exertion.  No claudication or rest pain,  No history of DVT or phlebitis. Pulmonary: No productive cough, asthma or wheezing. Neurologic: No weakness, paresthesias, aphasia, or amaurosis. No dizziness. Hematologic: No bleeding problems or clotting disorders. Musculoskeletal: No joint pain or joint swelling. Gastrointestinal: No blood in stool or hematemesis Genitourinary: No dysuria or hematuria. Psychiatric:: No history of major depression. Integumentary: No rashes or ulcers. Constitutional: No fever or chills.  PHYSICAL EXAMINATION: General: The patient appears their stated age.  Vital signs are BP 130/62  Pulse 86  Temp(Src) 98.6 F (37 C) (Oral)  Resp 18  SpO2 95% Pulmonary: Respirations are non-labored HEENT:  Multiple healed catheter exit sites bilaterally Musculoskeletal: There are no major deformities.   Neurologic: No focal weakness or paresthesias are detected, Skin: There are no ulcer or rashes noted. Psychiatric: The patient has normal affect. Cardiovascular: There is a regular rate and rhythm without significant murmur appreciated.  Diagnostic Studies: None    Assessment:  Recurrent TTP Plan: I discussed with the patient and her husband that we would place a tunneled plasmapheresis catheter.  All of her questions were answered.     Eldridge Abrahams, M.D. [facility] Office: [phone] Pager:  [phone]

## 2013-07-12 NOTE — Interval H&P Note (Signed)
History and Physical Interval Note:  07/12/2013 2:13 PM  [name]  has presented today for surgery, with the diagnosis of Thrombotic Thrombocytopenic Purpura  The various methods of treatment have been discussed with the patient and family. After consideration of risks, benefits and other options for treatment, the patient has consented to  Procedure(s): INSERTION OF DIALYSIS CATHETER (N/A) as a surgical intervention .  The patient's history has been reviewed, patient examined, no change in status, stable for surgery.  I have reviewed the patient's chart and labs.  Questions were answered to the patient's satisfaction.     EARLY, TODD

## 2013-07-12 NOTE — Anesthesia Preprocedure Evaluation (Addendum)
Anesthesia Evaluation  Patient identified by MRN, date of birth, ID band Patient awake    Reviewed: Allergy & Precautions, H&P , NPO status , Patient's Chart, lab work & pertinent test results, reviewed documented beta blocker date and time   Airway Mallampati: II TM Distance: >3 FB     Dental  (+) Teeth Intact   Pulmonary COPDformer smoker,          Cardiovascular     Neuro/Psych Depression    GI/Hepatic   Endo/Other    Renal/GU      Musculoskeletal   Abdominal   Peds  Hematology  (+) anemia ,   Anesthesia Other Findings TTP  Reproductive/Obstetrics                        Anesthesia Physical Anesthesia Plan  ASA: III  Anesthesia Plan: MAC   Post-op Pain Management:    Induction: Intravenous  Airway Management Planned: Simple Face Mask  Additional Equipment:   Intra-op Plan:   Post-operative Plan:   Informed Consent: I have reviewed the patients History and Physical, chart, labs and discussed the procedure including the risks, benefits and alternatives for the proposed anesthesia with the patient or authorized representative who has indicated his/her understanding and acceptance.   Dental advisory given  Plan Discussed with: CRNA and Anesthesiologist  Anesthesia Plan Comments:        Anesthesia Quick Evaluation

## 2013-07-12 NOTE — Anesthesia Procedure Notes (Addendum)
Procedure Name: MAC Date/Time: 07/12/2013 2:19 PM Performed by: Melina Copa, Brodyn Depuy R Pre-anesthesia Checklist: Patient identified, Emergency Drugs available, Suction available, Patient being monitored and Timeout performed Patient Re-evaluated:Patient Re-evaluated prior to inductionOxygen Delivery Method: Simple face mask Preoxygenation: Pre-oxygenation with 100% oxygen

## 2013-07-12 NOTE — Op Note (Signed)
    OPERATIVE REPORT  DATE OF SURGERY: 07/12/2013  PATIENT: [name], 73 y.o. female MRN: [id number]  DOB: October 31, 1940  PRE-OPERATIVE DIAGNOSIS: Thrombocytopenia  POST-OPERATIVE DIAGNOSIS:  Same  PROCEDURE: Left subclavian plasmapheresis catheter  SURGEON:  Curt Jews, M.D.  PHYSICIAN ASSISTANT: Nurse  ANESTHESIA:  Local with sedation  EBL: Minimal ml  Total I/O In: 500 [I.V.:500] Out: -   BLOOD ADMINISTERED: None  DRAINS: None  SPECIMEN: None  COUNTS CORRECT:  YES  PROCEDURE IN DETAIL: PACU with chest x-ray pending the patient was taken to the operating room placed supine position where the area of both right and left neck were imaged with ultrasound. The right intrajugular vein was extremely small. The left internal jugular vein was small but was a larger caliber than the right. Patient was placed in Trendelenburg position and the right and left neck were prepped and draped in usual sterile fashion. Using SonoSite ultrasound and local anesthesia the left internal jugular vein was accessed. A guidewire would not pass centrally. A end hole catheter was passed over the guidewire and an ejection revealed occlusion proximally up into the remaining. Further attempts were abandoned. Next using local anesthesia the left subclavian vein was easily entered and a guidewire specimen of the right atrium. A dilator and peel-away sheath was passed over the guidewire and the dilator and guidewire removed. Catheter was positioned through the peel-away sheath to the level of the distal right atrium. This was a 27 cm catheter and the catheter was brought through subcutaneous tunnel through a separate stab incision. A 2 lm ports were attached in both lumens flushed and aspirated easily and were locked with 1 cm per cc heparin. The catheter secured to the skin and was patient was transferred to the recovery with chest x-ray pending   PATIENT DISPOSITION:  PACU - hemodynamically  stable      Curt Jews, M.D. 07/12/2013 3:43 PM

## 2013-07-13 ENCOUNTER — Encounter (HOSPITAL_COMMUNITY): Payer: Self-pay | Admitting: Surgery

## 2013-07-13 DIAGNOSIS — J449 Chronic obstructive pulmonary disease, unspecified: Secondary | ICD-10-CM

## 2013-07-13 DIAGNOSIS — M311 Thrombotic microangiopathy, unspecified: Principal | ICD-10-CM

## 2013-07-13 LAB — THERAPEUTIC PLASMA EXCHANGE (BLOOD BANK)
PLASMA VOLUME NEEDED: 2800
UNIT DIVISION: 0
UNIT DIVISION: 0
UNIT DIVISION: 0
UNIT DIVISION: 0
UNIT DIVISION: 0
Unit division: 0
Unit division: 0
Unit division: 0
Unit division: 0
Unit division: 0
Unit division: 0

## 2013-07-13 LAB — CBC WITH DIFFERENTIAL/PLATELET
BASOS ABS: 0 10*3/uL (ref 0.0–0.1)
BASOS PCT: 0 % (ref 0–1)
Eosinophils Absolute: 0.1 10*3/uL (ref 0.0–0.7)
Eosinophils Relative: 1 % (ref 0–5)
HEMATOCRIT: 39.6 % (ref 36.0–46.0)
Hemoglobin: 13.5 g/dL (ref 12.0–15.0)
Lymphocytes Relative: 20 % (ref 12–46)
Lymphs Abs: 1.9 10*3/uL (ref 0.7–4.0)
MCH: 31.3 pg (ref 26.0–34.0)
MCHC: 34.1 g/dL (ref 30.0–36.0)
MCV: 91.9 fL (ref 78.0–100.0)
MONO ABS: 1 10*3/uL (ref 0.1–1.0)
Monocytes Relative: 10 % (ref 3–12)
Neutro Abs: 6.5 10*3/uL (ref 1.7–7.7)
Neutrophils Relative %: 69 % (ref 43–77)
Platelets: 57 10*3/uL — ABNORMAL LOW (ref 150–400)
RBC: 4.31 MIL/uL (ref 3.87–5.11)
RDW: 13.7 % (ref 11.5–15.5)
WBC: 9.5 10*3/uL (ref 4.0–10.5)

## 2013-07-13 LAB — COMPREHENSIVE METABOLIC PANEL
ALBUMIN: 2.9 g/dL — AB (ref 3.5–5.2)
ALT: 17 U/L (ref 0–35)
AST: 18 U/L (ref 0–37)
Alkaline Phosphatase: 61 U/L (ref 39–117)
BILIRUBIN TOTAL: 0.5 mg/dL (ref 0.3–1.2)
BUN: 35 mg/dL — ABNORMAL HIGH (ref 6–23)
CALCIUM: 9.1 mg/dL (ref 8.4–10.5)
CHLORIDE: 104 meq/L (ref 96–112)
CO2: 26 mEq/L (ref 19–32)
Creatinine, Ser: 1.07 mg/dL (ref 0.50–1.10)
GFR calc Af Amer: 59 mL/min — ABNORMAL LOW (ref >90)
GFR calc non Af Amer: 51 mL/min — ABNORMAL LOW (ref >90)
Glucose, Bld: 71 mg/dL (ref 70–99)
Potassium: 4.5 mEq/L (ref 3.7–5.3)
SODIUM: 142 meq/L (ref 137–147)
Total Protein: 5.2 g/dL — ABNORMAL LOW (ref 6.0–8.3)

## 2013-07-13 LAB — POCT I-STAT, CHEM 8
BUN: 33 mg/dL — AB (ref 6–23)
CREATININE: 1.3 mg/dL — AB (ref 0.50–1.10)
Calcium, Ion: 0.93 mmol/L — ABNORMAL LOW (ref 1.13–1.30)
Chloride: 98 mEq/L (ref 96–112)
GLUCOSE: 115 mg/dL — AB (ref 70–99)
HCT: 41 % (ref 36.0–46.0)
Hemoglobin: 13.9 g/dL (ref 12.0–15.0)
POTASSIUM: 3.9 meq/L (ref 3.7–5.3)
SODIUM: 144 meq/L (ref 137–147)
TCO2: 30 mmol/L (ref 0–100)

## 2013-07-13 LAB — RETICULOCYTES
RBC.: 4.31 MIL/uL (ref 3.87–5.11)
RETIC COUNT ABSOLUTE: 133.6 10*3/uL (ref 19.0–186.0)
Retic Ct Pct: 3.1 % (ref 0.4–3.1)

## 2013-07-13 LAB — LACTATE DEHYDROGENASE: LDH: 345 U/L — ABNORMAL HIGH (ref 94–250)

## 2013-07-13 MED ORDER — ACD FORMULA A 0.73-2.45-2.2 GM/100ML VI SOLN
Status: AC
  Administered 2013-07-13: 13:00:00
  Filled 2013-07-13: qty 500

## 2013-07-13 MED ORDER — CALCIUM CARBONATE ANTACID 500 MG PO CHEW
CHEWABLE_TABLET | ORAL | Status: AC
  Filled 2013-07-13: qty 2

## 2013-07-13 MED ORDER — NICOTINE 21 MG/24HR TD PT24
21.0000 mg | MEDICATED_PATCH | Freq: Every day | TRANSDERMAL | Status: DC
  Administered 2013-07-13 – 2013-07-16 (×4): 21 mg via TRANSDERMAL
  Filled 2013-07-13 (×6): qty 1

## 2013-07-13 NOTE — Progress Notes (Signed)
IP PROGRESS NOTE  Subjective:   Patient feels well. No complications from TPE last night.  No bleeding or clotting noted.   Objective:  Vital signs in last 24 hours: Temp:  [97.1 F (36.2 C)-98.6 F (37 C)] 98.3 F (36.8 C) (03/14 0506) Pulse Rate:  [67-86] 72 (03/14 0506) Resp:  [13-23] 16 (03/14 0506) BP: (109-151)/(45-75) 109/46 mmHg (03/14 0506) SpO2:  [91 %-99 %] 92 % (03/14 0506) Weight:  [174 lb 4.8 oz (79.062 kg)-188 lb 8 oz (85.503 kg)] 188 lb 8 oz (85.503 kg) (03/14 0506) Weight change:  Last BM Date: 07/12/13  Intake/Output from previous day: 03/13 0701 - 03/14 0700 In: 740 [P.O.:240; I.V.:500] Out: 1125 [Urine:1125]  Mouth: mucous membranes moist, pharynx normal without lesions Resp: clear to auscultation bilaterally Cardio: regular rate and rhythm, S1, S2 normal, no murmur, click, rub or gallop GI: soft, non-tender; bowel sounds normal; no masses,  no organomegaly Extremities: extremities normal, atraumatic, no cyanosis or edema Vascular catheter site appear clean without bleeding.   Lab Results:  Recent Labs  07/12/13 0836 07/13/13 0505  WBC 12.3* 9.5  HGB 15.3 13.5  HCT 44.4 39.6  PLT 63* 57*    BMET  Recent Labs  07/13/13 0505  NA 142  K 4.5  CL 104  CO2 26  GLUCOSE 71  BUN 35*  CREATININE 1.07  CALCIUM 9.1    Studies/Results: Dg Chest Port 1 View  07/12/2013   CLINICAL DATA:  Placement of dialysis catheter.  EXAM: PORTABLE CHEST - 1 VIEW  COMPARISON:  03/2011  FINDINGS: A left subclavian dialysis catheter is present. Catheter tip is near the superior cavoatrial junction. No evidence for a pneumothorax. Lungs are clear without airspace disease or significant edema. Heart size is normal.  IMPRESSION: Left subclavian dialysis catheter. Catheter tip at the cavoatrial junction. Negative for a pneumothorax.   Electronically Signed   By: Markus Daft M.D.   On: 07/12/2013 16:32   Dg Fluoro Guide Cv Line-no Report  07/12/2013   CLINICAL DATA:  dialysis catheter   FLOURO GUIDE CV LINE  Fluoroscopy was utilized by the requesting physician.  No radiographic  interpretation.     Medications: I have reviewed the patient's current medications.  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  #2. Obstructive airway disease  #3. 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 Degenerative arthritis of the spine     The plan is to continue with daily TPE + 80 mg IV solumedrol daily. Labs reviewed and no major changes at this time.     LOS: 1 day   [facility] 07/13/2013, 7:44 AM

## 2013-07-13 NOTE — Progress Notes (Signed)
Plasma exchange 2 out of 7 initiated without complications.  Arterial and venous catheter ports aspirated and flushed without difficulty.  Pt is resting comfortably without complaint.

## 2013-07-14 LAB — THERAPEUTIC PLASMA EXCHANGE (BLOOD BANK)
PLASMA VOLUME NEEDED: 2600
UNIT DIVISION: 0
UNIT DIVISION: 0
UNIT DIVISION: 0
UNIT DIVISION: 0
Unit division: 0
Unit division: 0
Unit division: 0
Unit division: 0
Unit division: 0
Unit division: 0

## 2013-07-14 LAB — POCT I-STAT, CHEM 8
BUN: 26 mg/dL — ABNORMAL HIGH (ref 6–23)
CREATININE: 1.1 mg/dL (ref 0.50–1.10)
Calcium, Ion: 0.84 mmol/L — ABNORMAL LOW (ref 1.13–1.30)
Chloride: 97 mEq/L (ref 96–112)
GLUCOSE: 134 mg/dL — AB (ref 70–99)
HCT: 40 % (ref 36.0–46.0)
Hemoglobin: 13.6 g/dL (ref 12.0–15.0)
Potassium: 4 mEq/L (ref 3.7–5.3)
Sodium: 142 mEq/L (ref 137–147)
TCO2: 28 mmol/L (ref 0–100)

## 2013-07-14 LAB — RETICULOCYTES
RBC.: 4.25 MIL/uL (ref 3.87–5.11)
RETIC CT PCT: 3.1 % (ref 0.4–3.1)
Retic Count, Absolute: 131.8 10*3/uL (ref 19.0–186.0)

## 2013-07-14 LAB — CBC WITH DIFFERENTIAL/PLATELET
Basophils Absolute: 0 10*3/uL (ref 0.0–0.1)
Basophils Relative: 0 % (ref 0–1)
EOS ABS: 0 10*3/uL (ref 0.0–0.7)
Eosinophils Relative: 0 % (ref 0–5)
HEMATOCRIT: 39.3 % (ref 36.0–46.0)
Hemoglobin: 13.4 g/dL (ref 12.0–15.0)
LYMPHS ABS: 1.1 10*3/uL (ref 0.7–4.0)
Lymphocytes Relative: 11 % — ABNORMAL LOW (ref 12–46)
MCH: 31.5 pg (ref 26.0–34.0)
MCHC: 34.1 g/dL (ref 30.0–36.0)
MCV: 92.5 fL (ref 78.0–100.0)
MONO ABS: 0.8 10*3/uL (ref 0.1–1.0)
MONOS PCT: 8 % (ref 3–12)
Neutro Abs: 8.7 10*3/uL — ABNORMAL HIGH (ref 1.7–7.7)
Neutrophils Relative %: 82 % — ABNORMAL HIGH (ref 43–77)
Platelets: 73 10*3/uL — ABNORMAL LOW (ref 150–400)
RBC: 4.25 MIL/uL (ref 3.87–5.11)
RDW: 13.7 % (ref 11.5–15.5)
WBC: 10.6 10*3/uL — ABNORMAL HIGH (ref 4.0–10.5)

## 2013-07-14 LAB — COMPREHENSIVE METABOLIC PANEL
ALT: 16 U/L (ref 0–35)
AST: 19 U/L (ref 0–37)
Albumin: 2.9 g/dL — ABNORMAL LOW (ref 3.5–5.2)
Alkaline Phosphatase: 64 U/L (ref 39–117)
BUN: 29 mg/dL — AB (ref 6–23)
CO2: 29 mEq/L (ref 19–32)
CREATININE: 0.95 mg/dL (ref 0.50–1.10)
Calcium: 9.1 mg/dL (ref 8.4–10.5)
Chloride: 102 mEq/L (ref 96–112)
GFR calc Af Amer: 68 mL/min — ABNORMAL LOW (ref >90)
GFR calc non Af Amer: 59 mL/min — ABNORMAL LOW (ref >90)
Glucose, Bld: 117 mg/dL — ABNORMAL HIGH (ref 70–99)
Potassium: 4.8 mEq/L (ref 3.7–5.3)
Sodium: 141 mEq/L (ref 137–147)
TOTAL PROTEIN: 5.5 g/dL — AB (ref 6.0–8.3)
Total Bilirubin: 0.3 mg/dL (ref 0.3–1.2)

## 2013-07-14 LAB — LACTATE DEHYDROGENASE: LDH: 290 U/L — ABNORMAL HIGH (ref 94–250)

## 2013-07-14 MED ORDER — MORPHINE SULFATE 2 MG/ML IJ SOLN: 2.0000 mg | INTRAMUSCULAR | Status: DC | PRN

## 2013-07-14 MED ORDER — DIPHENHYDRAMINE HCL 25 MG PO CAPS
ORAL_CAPSULE | ORAL | Status: AC
  Filled 2013-07-14: qty 1

## 2013-07-14 MED ORDER — ANTICOAGULANT SODIUM CITRATE 4% (200MG/5ML) IV SOLN
5.0000 mL | Freq: Once | Status: AC
  Administered 2013-07-14: 5 mL
  Filled 2013-07-14 (×2): qty 250

## 2013-07-14 MED ORDER — ACD FORMULA A 0.73-2.45-2.2 GM/100ML VI SOLN
500.0000 mL | Status: DC
  Administered 2013-07-14: 500 mL via INTRAVENOUS
  Administered 2013-07-14: 200 mL via INTRAVENOUS
  Filled 2013-07-14: qty 500

## 2013-07-14 MED ORDER — ACD FORMULA A 0.73-2.45-2.2 GM/100ML VI SOLN
Status: AC
  Administered 2013-07-14: 500 mL via INTRAVENOUS
  Filled 2013-07-14: qty 500

## 2013-07-14 MED ORDER — CALCIUM CARBONATE ANTACID 500 MG PO CHEW
2.0000 | CHEWABLE_TABLET | ORAL | Status: AC
  Administered 2013-07-14: 400 mg via ORAL

## 2013-07-14 MED ORDER — CALCIUM CARBONATE ANTACID 500 MG PO CHEW
CHEWABLE_TABLET | ORAL | Status: AC
  Administered 2013-07-14: 400 mg via ORAL
  Filled 2013-07-14: qty 2

## 2013-07-14 MED ORDER — DIPHENHYDRAMINE HCL 25 MG PO CAPS
25.0000 mg | ORAL_CAPSULE | Freq: Four times a day (QID) | ORAL | Status: DC | PRN
  Administered 2013-07-14: 25 mg via ORAL

## 2013-07-14 MED ORDER — OXYCODONE-ACETAMINOPHEN 5-325 MG PO TABS
2.0000 | ORAL_TABLET | Freq: Four times a day (QID) | ORAL | Status: DC | PRN
  Administered 2013-07-14: 2 via ORAL
  Filled 2013-07-14: qty 2

## 2013-07-14 MED ORDER — CALCIUM GLUCONATE 10 % IV SOLN
4.0000 g | Freq: Once | INTRAVENOUS | Status: AC
  Administered 2013-07-14: 4 g via INTRAVENOUS
  Filled 2013-07-14 (×2): qty 40

## 2013-07-14 MED ORDER — ACD FORMULA A 0.73-2.45-2.2 GM/100ML VI SOLN
Status: AC
  Administered 2013-07-14: 200 mL via INTRAVENOUS
  Filled 2013-07-14: qty 500

## 2013-07-14 MED ORDER — ACETAMINOPHEN 325 MG PO TABS: 650.0000 mg | ORAL_TABLET | ORAL | Status: DC | PRN

## 2013-07-14 NOTE — Progress Notes (Signed)
Pt tolerated TPE well; no s/s of a reaction noted during tx. This is the third of seven plasma exchanges.

## 2013-07-14 NOTE — Progress Notes (Signed)
IP PROGRESS NOTE  Subjective:   Patient feels well. No complications from TPE on 3/14.   No bleeding or clotting noted. She had a nicotine patch placed which helped with the carvings.  No neurological complaints.   Objective:  Vital signs in last 24 hours: Temp:  [97.9 F (36.6 C)-99.4 F (37.4 C)] 98.1 F (36.7 C) (03/15 0619) Pulse Rate:  [65-84] 65 (03/15 0619) Resp:  [13-23] 17 (03/15 0619) BP: (104-135)/(41-62) 113/53 mmHg (03/15 0619) SpO2:  [92 %-93 %] 93 % (03/15 0619) Weight:  [179 lb 0.2 oz (81.2 kg)-184 lb 1.4 oz (83.5 kg)] 184 lb 1.4 oz (83.5 kg) (03/15 0619) Weight change: 4 lb 11.4 oz (2.138 kg) Last BM Date: 07/12/13  Intake/Output from previous day: 03/14 0701 - 03/15 0700 In: 984 [P.O.:600; I.V.:384] Out: 550 [Urine:550]  Mouth: mucous membranes moist, pharynx normal without lesions Resp: clear to auscultation bilaterally Cardio: regular rate and rhythm, S1, S2 normal, no murmur, click, rub or gallop GI: soft, non-tender; bowel sounds normal; no masses,  no organomegaly Extremities: extremities normal, atraumatic, no cyanosis or edema Vascular catheter site appear clean without bleeding.   Lab Results:  Recent Labs  07/13/13 0505 07/13/13 1126 07/14/13 0547  WBC 9.5  --  10.6*  HGB 13.5 13.9 13.4  HCT 39.6 41.0 39.3  PLT 57*  --  73*    BMET  Recent Labs  07/13/13 0505 07/13/13 1126 07/14/13 0547  NA 142 144 141  K 4.5 3.9 4.8  CL 104 98 102  CO2 26  --  29  GLUCOSE 71 115* 117*  BUN 35* 33* 29*  CREATININE 1.07 1.30* 0.95  CALCIUM 9.1  --  9.1    Studies/Results: Dg Chest Port 1 View  07/12/2013   CLINICAL DATA:  Placement of dialysis catheter.  EXAM: PORTABLE CHEST - 1 VIEW  COMPARISON:  03/2011  FINDINGS: A left subclavian dialysis catheter is present. Catheter tip is near the superior cavoatrial junction. No evidence for a pneumothorax. Lungs are clear without airspace disease or significant edema. Heart size is normal.   IMPRESSION: Left subclavian dialysis catheter. Catheter tip at the cavoatrial junction. Negative for a pneumothorax.   Electronically Signed   By: Markus Daft M.D.   On: 07/12/2013 16:32   Dg Fluoro Guide Cv Line-no Report  07/12/2013   CLINICAL DATA: dialysis catheter   FLOURO GUIDE CV LINE  Fluoroscopy was utilized by the requesting physician.  No radiographic  interpretation.     Medications: I have reviewed the patient's current medications.  Assessment/Plan:  73 year old with the following issues:   #1 relapsed TTP. S/P day 2 of TPE well tolerated.Platalets are up to 73K. LDH coming down as well.   #2. Obstructive airway disease  #3. 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 Degenerative arthritis of the spine     The plan is to continue with daily TPE + 80 mg IV solumedrol daily.     LOS: 2 days   [facility] 07/14/2013, 7:36 AM

## 2013-07-15 LAB — THERAPEUTIC PLASMA EXCHANGE (BLOOD BANK)
Plasma volume needed: 2600
UNIT DIVISION: 0
UNIT DIVISION: 0
UNIT DIVISION: 0
UNIT DIVISION: 0
Unit division: 0
Unit division: 0
Unit division: 0
Unit division: 0
Unit division: 0
Unit division: 0

## 2013-07-15 LAB — COMPREHENSIVE METABOLIC PANEL
ALT: 15 U/L (ref 0–35)
AST: 17 U/L (ref 0–37)
Albumin: 2.9 g/dL — ABNORMAL LOW (ref 3.5–5.2)
Alkaline Phosphatase: 57 U/L (ref 39–117)
BUN: 29 mg/dL — ABNORMAL HIGH (ref 6–23)
CALCIUM: 8.9 mg/dL (ref 8.4–10.5)
CO2: 30 meq/L (ref 19–32)
CREATININE: 0.94 mg/dL (ref 0.50–1.10)
Chloride: 95 mEq/L — ABNORMAL LOW (ref 96–112)
GFR, EST AFRICAN AMERICAN: 69 mL/min — AB (ref >90)
GFR, EST NON AFRICAN AMERICAN: 60 mL/min — AB (ref >90)
Glucose, Bld: 89 mg/dL (ref 70–99)
Potassium: 4.2 mEq/L (ref 3.7–5.3)
Sodium: 141 mEq/L (ref 137–147)
Total Bilirubin: 0.3 mg/dL (ref 0.3–1.2)
Total Protein: 5.2 g/dL — ABNORMAL LOW (ref 6.0–8.3)

## 2013-07-15 LAB — CBC WITH DIFFERENTIAL/PLATELET
Basophils Absolute: 0 10*3/uL (ref 0.0–0.1)
Basophils Relative: 0 % (ref 0–1)
EOS PCT: 0 % (ref 0–5)
Eosinophils Absolute: 0 10*3/uL (ref 0.0–0.7)
HEMATOCRIT: 37.2 % (ref 36.0–46.0)
Hemoglobin: 12.7 g/dL (ref 12.0–15.0)
LYMPHS PCT: 12 % (ref 12–46)
Lymphs Abs: 2 10*3/uL (ref 0.7–4.0)
MCH: 31.6 pg (ref 26.0–34.0)
MCHC: 34.1 g/dL (ref 30.0–36.0)
MCV: 92.5 fL (ref 78.0–100.0)
MONO ABS: 1.5 10*3/uL — AB (ref 0.1–1.0)
Monocytes Relative: 9 % (ref 3–12)
Neutro Abs: 12.8 10*3/uL — ABNORMAL HIGH (ref 1.7–7.7)
Neutrophils Relative %: 79 % — ABNORMAL HIGH (ref 43–77)
Platelets: 87 10*3/uL — ABNORMAL LOW (ref 150–400)
RBC: 4.02 MIL/uL (ref 3.87–5.11)
RDW: 13.5 % (ref 11.5–15.5)
WBC: 16.3 10*3/uL — ABNORMAL HIGH (ref 4.0–10.5)

## 2013-07-15 MED ORDER — TIOTROPIUM BROMIDE MONOHYDRATE 18 MCG IN CAPS
18.0000 ug | ORAL_CAPSULE | Freq: Every day | RESPIRATORY_TRACT | Status: DC
  Administered 2013-07-15 – 2013-07-16 (×2): 18 ug via RESPIRATORY_TRACT
  Filled 2013-07-15: qty 5

## 2013-07-15 MED ORDER — CALCIUM CARBONATE ANTACID 500 MG PO CHEW
2.0000 | CHEWABLE_TABLET | ORAL | Status: AC
  Administered 2013-07-15 (×2): 400 mg via ORAL

## 2013-07-15 MED ORDER — CALCIUM CARBONATE ANTACID 500 MG PO CHEW
CHEWABLE_TABLET | ORAL | Status: AC
  Administered 2013-07-15: 400 mg
  Filled 2013-07-15: qty 4

## 2013-07-15 MED ORDER — DIPHENHYDRAMINE HCL 25 MG PO CAPS
ORAL_CAPSULE | ORAL | Status: AC
  Filled 2013-07-15: qty 1

## 2013-07-15 MED ORDER — ACD FORMULA A 0.73-2.45-2.2 GM/100ML VI SOLN
Status: AC
  Administered 2013-07-15: 500 mL via INTRAVENOUS
  Filled 2013-07-15: qty 500

## 2013-07-15 MED ORDER — ACD FORMULA A 0.73-2.45-2.2 GM/100ML VI SOLN
500.0000 mL | Status: DC
  Administered 2013-07-15: 500 mL via INTRAVENOUS
  Filled 2013-07-15: qty 500

## 2013-07-15 MED ORDER — METHYLPREDNISOLONE SODIUM SUCC 125 MG IJ SOLR
INTRAMUSCULAR | Status: AC
  Administered 2013-07-15: 80 mg via INTRAVENOUS
  Filled 2013-07-15: qty 2

## 2013-07-15 MED ORDER — DIPHENHYDRAMINE HCL 25 MG PO CAPS
25.0000 mg | ORAL_CAPSULE | Freq: Four times a day (QID) | ORAL | Status: DC | PRN
  Administered 2013-07-15 – 2013-07-16 (×2): 25 mg via ORAL

## 2013-07-15 MED ORDER — CHOLECALCIFEROL 10 MCG (400 UNIT) PO TABS
400.0000 [IU] | ORAL_TABLET | Freq: Every day | ORAL | Status: DC
  Administered 2013-07-15: 400 [IU] via ORAL
  Filled 2013-07-15 (×2): qty 1

## 2013-07-15 MED ORDER — SODIUM CHLORIDE 0.9 % IV SOLN
4.0000 g | Freq: Once | INTRAVENOUS | Status: AC
  Administered 2013-07-15: 4 g via INTRAVENOUS
  Filled 2013-07-15: qty 40

## 2013-07-15 MED ORDER — ACETAMINOPHEN 325 MG PO TABS: 650.0000 mg | ORAL_TABLET | ORAL | Status: DC | PRN

## 2013-07-15 MED ORDER — ANTICOAGULANT SODIUM CITRATE 4% (200MG/5ML) IV SOLN
5.0000 mL | Freq: Once | Status: AC
  Administered 2013-07-15: 5 mL
  Filled 2013-07-15: qty 250

## 2013-07-15 MED ORDER — FLUOXETINE HCL 20 MG PO CAPS
20.0000 mg | ORAL_CAPSULE | Freq: Two times a day (BID) | ORAL | Status: DC
  Administered 2013-07-15 (×2): 20 mg via ORAL
  Filled 2013-07-15 (×5): qty 1

## 2013-07-15 MED ORDER — PREDNISONE 50 MG PO TABS
60.0000 mg | ORAL_TABLET | Freq: Every day | ORAL | Status: DC
  Administered 2013-07-15 – 2013-07-16 (×2): 60 mg via ORAL
  Filled 2013-07-15 (×3): qty 1

## 2013-07-15 MED ORDER — GABAPENTIN 400 MG PO CAPS
400.0000 mg | ORAL_CAPSULE | Freq: Three times a day (TID) | ORAL | Status: DC
  Administered 2013-07-15 (×4): 400 mg via ORAL
  Filled 2013-07-15 (×7): qty 1

## 2013-07-15 NOTE — Progress Notes (Addendum)
He Progress Note:  Subjective: She is tolerating plasma exchange well. She has not had 3 exchanges. Hemoglobin is stable. Platelet count Nader 57,000 on March 14 starting to come up 73,000 on March 15. Today's value pending. Peak LDH 345. Trend down yesterday at 290.   Vitals: Filed Vitals:   07/15/13 0616  BP: 122/57  Pulse: 64  Temp: 98 F (36.7 C)  Resp: 20   Wt Readings from Last 3 Encounters:  07/15/13 186 lb 4.6 oz (84.5 kg)  07/15/13 186 lb 4.6 oz (84.5 kg)  05/10/13 177 lb 9.6 oz (80.559 kg)     PHYSICAL EXAM:  General awake, alert, oriented Head: Normal Eyes: Normal Throat: No erythema or exudate Neck: Full range of motion. Lymph Nodes: Lungs: Clear to auscultation resonant to percussion Breasts:  Cardiac: Regular rhythm no murmur Abdominal: Soft, nontender Extremities: Calves nontender Vascular: Vascular catheter left subclavian position. Wound dressing on dry  Neurologic PERRLA, motor strength 5 over 5, cranial nerves grossly normal Skin: No rash or ecchymosis  Labs:   Recent Labs  07/13/13 0505  07/14/13 0547 07/14/13 1057  WBC 9.5  --  10.6*  --   HGB 13.5  < > 13.4 13.6  HCT 39.6  < > 39.3 40.0  PLT 57*  --  73*  --   < > = values in this interval not displayed.  Recent Labs  07/13/13 0505  07/14/13 0547 07/14/13 1057  NA 142  < > 141 142  K 4.5  < > 4.8 4.0  CL 104  < > 102 97  CO2 26  --  29  --   GLUCOSE 71  < > 117* 134*  BUN 35*  < > 29* 26*  CREATININE 1.07  < > 0.95 1.10  CALCIUM 9.1  --  9.1  --   < > = values in this interval not displayed.    Images Studies/Results:   No results found.   Patient Active Problem List   Diagnosis Date Noted  . 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 Tobacco abuse 05/10/2013    Priority: Medium  . Enterococcal sepsis 06/13/2011  . Endocarditis, bacterial, acute/subacute 06/13/2011  . Gait instability 06/13/2011  . Epidural abscess 03/16/2011   . Sepsis 03/16/2011  . History of TTP (thrombotic thrombocytopenic purpura) 03/16/2011  . COPD (chronic obstructive pulmonary disease) 03/16/2011    Assessment and Plan:  #1. Third relapse of TTP Continue daily plasma exchange and steroids reevaluate after 7 exchanges. If she has achieved a complete response then go to every other day exchange as an outpatient.  #2. Obstructive airway disease  #3. Tobacco addiction  #4. Chronic anxiety and depression     Kitai Purdom M 07/15/2013, 7:14 AM

## 2013-07-15 NOTE — Progress Notes (Signed)
Back from dialysis, VSS, not in any distress, Perm cath intact, dressing CDI.Will conitnue to monitor.

## 2013-07-15 NOTE — Anesthesia Postprocedure Evaluation (Signed)
  Anesthesia Post-op Note  Patient: [name]  Procedure(s) Performed: Procedure(s): INSERTION OF DIALYSIS CATHETER   with Ultrasound (Left)  Patient Location: PACU  Anesthesia Type:MAC  Level of Consciousness: awake, alert , oriented and patient cooperative  Airway and Oxygen Therapy: Patient Spontanous Breathing  Post-op Pain: mild  Post-op Assessment: Post-op Vital signs reviewed, Patient's Cardiovascular Status Stable, Respiratory Function Stable, Patent Airway, No signs of Nausea or vomiting and Pain level controlled  Post-op Vital Signs: stable  Complications: No apparent anesthesia complications

## 2013-07-16 ENCOUNTER — Encounter (HOSPITAL_COMMUNITY): Payer: Self-pay | Admitting: Vascular Surgery

## 2013-07-16 LAB — CBC
HEMATOCRIT: 37.8 % (ref 36.0–46.0)
Hemoglobin: 12.8 g/dL (ref 12.0–15.0)
MCH: 31.5 pg (ref 26.0–34.0)
MCHC: 33.9 g/dL (ref 30.0–36.0)
MCV: 93.1 fL (ref 78.0–100.0)
Platelets: 113 10*3/uL — ABNORMAL LOW (ref 150–400)
RBC: 4.06 MIL/uL (ref 3.87–5.11)
RDW: 13.8 % (ref 11.5–15.5)
WBC: 14.4 10*3/uL — ABNORMAL HIGH (ref 4.0–10.5)

## 2013-07-16 LAB — POCT I-STAT, CHEM 8
BUN: 27 mg/dL — ABNORMAL HIGH (ref 6–23)
BUN: 33 mg/dL — ABNORMAL HIGH (ref 6–23)
CHLORIDE: 96 meq/L (ref 96–112)
Calcium, Ion: 0.69 mmol/L — ABNORMAL LOW (ref 1.13–1.30)
Calcium, Ion: 1.13 mmol/L (ref 1.13–1.30)
Chloride: 96 mEq/L (ref 96–112)
Creatinine, Ser: 1.1 mg/dL (ref 0.50–1.10)
Creatinine, Ser: 1.1 mg/dL (ref 0.50–1.10)
Glucose, Bld: 93 mg/dL (ref 70–99)
Glucose, Bld: 124 mg/dL — ABNORMAL HIGH (ref 70–99)
HEMATOCRIT: 39 % (ref 36.0–46.0)
HEMATOCRIT: 38 % (ref 36.0–46.0)
Hemoglobin: 13.3 g/dL (ref 12.0–15.0)
Hemoglobin: 12.9 g/dL (ref 12.0–15.0)
POTASSIUM: 4 meq/L (ref 3.7–5.3)
POTASSIUM: 3.7 meq/L (ref 3.7–5.3)
SODIUM: 140 meq/L (ref 137–147)
Sodium: 142 mEq/L (ref 137–147)
TCO2: 30 mmol/L (ref 0–100)
TCO2: 31 mmol/L (ref 0–100)

## 2013-07-16 LAB — THERAPEUTIC PLASMA EXCHANGE (BLOOD BANK)
Plasma volume needed: 2800
UNIT DIVISION: 0
UNIT DIVISION: 0
UNIT DIVISION: 0
UNIT DIVISION: 0
Unit division: 0
Unit division: 0
Unit division: 0
Unit division: 0
Unit division: 0
Unit division: 0

## 2013-07-16 LAB — RETICULOCYTES
RBC.: 4.06 MIL/uL (ref 3.87–5.11)
Retic Count, Absolute: 117.7 10*3/uL (ref 19.0–186.0)
Retic Ct Pct: 2.9 % (ref 0.4–3.1)

## 2013-07-16 MED ORDER — ACD FORMULA A 0.73-2.45-2.2 GM/100ML VI SOLN
Status: AC
  Administered 2013-07-16: 09:00:00
  Filled 2013-07-16: qty 500

## 2013-07-16 MED ORDER — ANTICOAGULANT SODIUM CITRATE 4% (200MG/5ML) IV SOLN
5.0000 mL | Freq: Once | Status: AC
  Administered 2013-07-16: 5 mL
  Filled 2013-07-16: qty 250

## 2013-07-16 MED ORDER — DIPHENHYDRAMINE HCL 25 MG PO CAPS: 25.0000 mg | ORAL_CAPSULE | Freq: Four times a day (QID) | ORAL | Status: DC | PRN

## 2013-07-16 MED ORDER — ACETAMINOPHEN 325 MG PO TABS: 650.0000 mg | ORAL_TABLET | ORAL | Status: DC | PRN

## 2013-07-16 MED ORDER — CALCIUM CARBONATE ANTACID 500 MG PO CHEW
CHEWABLE_TABLET | ORAL | Status: AC
  Filled 2013-07-16: qty 2

## 2013-07-16 MED ORDER — SODIUM CHLORIDE 0.9 % IV SOLN
4.0000 g | Freq: Once | INTRAVENOUS | Status: AC
  Administered 2013-07-16: 4 g via INTRAVENOUS
  Filled 2013-07-16: qty 40

## 2013-07-16 MED ORDER — DIPHENHYDRAMINE HCL 25 MG PO CAPS
ORAL_CAPSULE | ORAL | Status: AC
  Filled 2013-07-16: qty 1

## 2013-07-16 MED ORDER — ACD FORMULA A 0.73-2.45-2.2 GM/100ML VI SOLN
500.0000 mL | Status: DC
  Filled 2013-07-16: qty 500

## 2013-07-16 MED ORDER — ACD FORMULA A 0.73-2.45-2.2 GM/100ML VI SOLN
Status: AC
  Administered 2013-07-16: 11:00:00
  Filled 2013-07-16: qty 500

## 2013-07-16 MED ORDER — METHYLPREDNISOLONE SODIUM SUCC 125 MG IJ SOLR
80.0000 mg | Freq: Every day | INTRAMUSCULAR | Status: DC
  Administered 2013-07-16 – 2013-07-17 (×2): 80 mg via INTRAVENOUS
  Filled 2013-07-16 (×2): qty 1.28

## 2013-07-16 MED ORDER — CALCIUM CARBONATE ANTACID 500 MG PO CHEW
2.0000 | CHEWABLE_TABLET | ORAL | Status: DC
  Administered 2013-07-16: 400 mg via ORAL

## 2013-07-16 NOTE — Progress Notes (Signed)
Progress Note:  Subjective: For plasma exchange #5 today Platelet count slowly improving, LDH falling = response to Rx Afebrile    Vitals: Filed Vitals:   07/16/13 0550  BP: 119/47  Pulse: 68  Temp: 97.8 F (36.6 C)  Resp: 18   Wt Readings from Last 3 Encounters:  07/16/13 186 lb 8.2 oz (84.6 kg)  07/16/13 186 lb 8.2 oz (84.6 kg)  05/10/13 177 lb 9.6 oz (80.559 kg)     PHYSICAL EXAM:  General NAD Head:WNL Eyes:WNL Throat:no erythema or exudate Neck: Lymph Nodes: Lungs:clear to A&P Breasts:  Cardiac:regular rhythm, no murmur Abdominal: soft, non-tender Extremities:no edema, no calf tenderness Vascular:  Left subclavian cath site dry Neurologic non focal, alert and oriented, motor 5/5 Skin: no rash or ecchymosis  Labs:   Recent Labs  07/14/13 0547  07/15/13 0834 07/15/13 0855  WBC 10.6*  --   --  16.3*  HGB 13.4  < > 13.3 12.7  HCT 39.3  < > 39.0 37.2  PLT 73*  --   --  87*  < > = values in this interval not displayed.  Recent Labs  07/14/13 0547  07/15/13 0834 07/15/13 0855  NA 141  < > 140 141  K 4.8  < > 4.0 4.2  CL 102  < > 96 95*  CO2 29  --   --  30  GLUCOSE 117*  < > 93 89  BUN 29*  < > 27* 29*  CREATININE 0.95  < > 1.10 0.94  CALCIUM 9.1  --   --  8.9  < > = values in this interval not displayed.    Images Studies/Results:   No results found.   Patient Active Problem List   Diagnosis Date Noted  . 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 Tobacco abuse 05/10/2013    Priority: Medium  . Enterococcal sepsis 06/13/2011  . Endocarditis, bacterial, acute/subacute 06/13/2011  . Gait instability 06/13/2011  . 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 Sepsis 03/16/2011  . History of TTP (thrombotic thrombocytopenic purpura) 03/16/2011  . COPD (chronic obstructive pulmonary disease) 03/16/2011    Assessment and Plan:   #1. Third relapse of TTP  Continue daily plasma exchange and steroids reevaluate  after 7 exchanges. If she has achieved a complete response then go to every other day exchange as an outpatient.   #2. Obstructive airway disease   #3. Tobacco addiction   #4. Chronic anxiety and depression     GRANFORTUNA,JAMES M 07/16/2013, 8:36 AM

## 2013-07-16 NOTE — Progress Notes (Signed)
Plasma exchange 5 out of 7 initiated without complications.  Arterial and venous catheter ports aspirated and flushed without difficulty.  Vital signs stable and pt is without complaint.

## 2013-07-17 ENCOUNTER — Ambulatory Visit: Payer: Medicare Other | Admitting: Oncology

## 2013-07-17 DIAGNOSIS — G609 Hereditary and idiopathic neuropathy, unspecified: Secondary | ICD-10-CM

## 2013-07-17 LAB — THERAPEUTIC PLASMA EXCHANGE (BLOOD BANK)
Plasma Exchange: 2802
Plasma volume needed: 2600
Unit division: 0
Unit division: 0
Unit division: 0
Unit division: 0
Unit division: 0
Unit division: 0
Unit division: 0
Unit division: 0
Unit division: 0
Unit division: 0

## 2013-07-17 LAB — CBC WITH DIFFERENTIAL/PLATELET
Basophils Absolute: 0 10*3/uL (ref 0.0–0.1)
Basophils Relative: 0 % (ref 0–1)
EOS ABS: 0 10*3/uL (ref 0.0–0.7)
EOS PCT: 0 % (ref 0–5)
HEMATOCRIT: 36 % (ref 36.0–46.0)
HEMOGLOBIN: 12.2 g/dL (ref 12.0–15.0)
LYMPHS ABS: 0.6 10*3/uL — AB (ref 0.7–4.0)
Lymphocytes Relative: 4 % — ABNORMAL LOW (ref 12–46)
MCH: 31.4 pg (ref 26.0–34.0)
MCHC: 33.9 g/dL (ref 30.0–36.0)
MCV: 92.8 fL (ref 78.0–100.0)
MONOS PCT: 3 % (ref 3–12)
Monocytes Absolute: 0.4 10*3/uL (ref 0.1–1.0)
Neutro Abs: 12.2 10*3/uL — ABNORMAL HIGH (ref 1.7–7.7)
Neutrophils Relative %: 93 % — ABNORMAL HIGH (ref 43–77)
Platelets: 115 10*3/uL — ABNORMAL LOW (ref 150–400)
RBC: 3.88 MIL/uL (ref 3.87–5.11)
RDW: 13.7 % (ref 11.5–15.5)
WBC: 13.2 10*3/uL — ABNORMAL HIGH (ref 4.0–10.5)

## 2013-07-17 LAB — COMPREHENSIVE METABOLIC PANEL
ALBUMIN: 2.9 g/dL — AB (ref 3.5–5.2)
ALK PHOS: 85 U/L (ref 39–117)
ALT: 21 U/L (ref 0–35)
AST: 22 U/L (ref 0–37)
BUN: 40 mg/dL — AB (ref 6–23)
CO2: 28 mEq/L (ref 19–32)
Calcium: 9 mg/dL (ref 8.4–10.5)
Chloride: 101 mEq/L (ref 96–112)
Creatinine, Ser: 0.93 mg/dL (ref 0.50–1.10)
GFR calc Af Amer: 70 mL/min — ABNORMAL LOW (ref >90)
GFR calc non Af Amer: 60 mL/min — ABNORMAL LOW (ref >90)
Glucose, Bld: 158 mg/dL — ABNORMAL HIGH (ref 70–99)
POTASSIUM: 4.4 meq/L (ref 3.7–5.3)
SODIUM: 143 meq/L (ref 137–147)
Total Bilirubin: 0.3 mg/dL (ref 0.3–1.2)
Total Protein: 5.5 g/dL — ABNORMAL LOW (ref 6.0–8.3)

## 2013-07-17 LAB — POCT I-STAT, CHEM 8
BUN: 35 mg/dL — AB (ref 6–23)
CALCIUM ION: 1.06 mmol/L — AB (ref 1.13–1.30)
CHLORIDE: 99 meq/L (ref 96–112)
Creatinine, Ser: 1.1 mg/dL (ref 0.50–1.10)
GLUCOSE: 113 mg/dL — AB (ref 70–99)
HCT: 36 % (ref 36.0–46.0)
Hemoglobin: 12.2 g/dL (ref 12.0–15.0)
Potassium: 3.8 mEq/L (ref 3.7–5.3)
Sodium: 144 mEq/L (ref 137–147)
TCO2: 27 mmol/L (ref 0–100)

## 2013-07-17 LAB — RETICULOCYTES
RBC.: 3.88 MIL/uL (ref 3.87–5.11)
Retic Count, Absolute: 108.6 10*3/uL (ref 19.0–186.0)
Retic Ct Pct: 2.8 % (ref 0.4–3.1)

## 2013-07-17 LAB — LACTATE DEHYDROGENASE: LDH: 333 U/L — ABNORMAL HIGH (ref 94–250)

## 2013-07-17 MED ORDER — ANTICOAGULANT SODIUM CITRATE 4% (200MG/5ML) IV SOLN
5.0000 mL | Freq: Once | Status: AC
  Administered 2013-07-17: 5 mL
  Filled 2013-07-17: qty 250

## 2013-07-17 MED ORDER — OXYCODONE-ACETAMINOPHEN 5-325 MG PO TABS: 2.0000 | ORAL_TABLET | Freq: Four times a day (QID) | ORAL | Status: DC | PRN

## 2013-07-17 MED ORDER — CALCIUM CARBONATE ANTACID 500 MG PO CHEW
2.0000 | CHEWABLE_TABLET | ORAL | Status: DC
  Administered 2013-07-17: 400 mg via ORAL

## 2013-07-17 MED ORDER — ACETAMINOPHEN 325 MG PO TABS: 650.0000 mg | ORAL_TABLET | ORAL | Status: DC | PRN

## 2013-07-17 MED ORDER — ACD FORMULA A 0.73-2.45-2.2 GM/100ML VI SOLN
500.0000 mL | Status: DC
  Administered 2013-07-17: 500 mL via INTRAVENOUS
  Filled 2013-07-17: qty 500

## 2013-07-17 MED ORDER — ACD FORMULA A 0.73-2.45-2.2 GM/100ML VI SOLN
Status: AC
  Filled 2013-07-17: qty 500

## 2013-07-17 MED ORDER — ACD FORMULA A 0.73-2.45-2.2 GM/100ML VI SOLN
Status: AC
  Administered 2013-07-17: 10:00:00
  Filled 2013-07-17: qty 500

## 2013-07-17 MED ORDER — CALCIUM CARBONATE ANTACID 500 MG PO CHEW
CHEWABLE_TABLET | ORAL | Status: AC
  Filled 2013-07-17: qty 2

## 2013-07-17 MED ORDER — NICOTINE 21 MG/24HR TD PT24: 21.0000 mg | MEDICATED_PATCH | Freq: Every day | TRANSDERMAL | Status: DC

## 2013-07-17 MED ORDER — SODIUM CHLORIDE 0.9 % IV SOLN
4.0000 g | Freq: Once | INTRAVENOUS | Status: AC
  Administered 2013-07-17: 4 g via INTRAVENOUS
  Filled 2013-07-17: qty 40

## 2013-07-17 MED ORDER — DIPHENHYDRAMINE HCL 25 MG PO CAPS
25.0000 mg | ORAL_CAPSULE | Freq: Four times a day (QID) | ORAL | Status: DC | PRN
  Administered 2013-07-17: 25 mg via ORAL

## 2013-07-17 MED ORDER — DIPHENHYDRAMINE HCL 25 MG PO CAPS
ORAL_CAPSULE | ORAL | Status: AC
  Filled 2013-07-17: qty 1

## 2013-07-17 NOTE — Progress Notes (Signed)
Discharge instructions reviewed with pt and prescription given.  Pt verbalized understanding and had no questions.  Pt discharged in stable condition with husband.  [name] [place]

## 2013-07-17 NOTE — Discharge Summary (Signed)
Physician Discharge Summary  Patient ID: [name] MRN: [id number] DOB/AGE: [birth year] 71 y.o.  Admit date: 07/12/2013 Discharge date: 07/17/2013  Admission Diagnoses: relapsed TTP he  Hospital Course:   73 year old woman who initially presented with neurologic symptoms as the first sign of TTP in January 2005. She was induced into remission with plasma exchange and steroids. She relapsed in August 2006. She again achieved remission with plasma exchange and steroids. She was given consolidation treatment with Rituxan anti-B cell antibody. She relapsed again in October 2011. She was treated with plasma exchange, steroids, and Rituxan. She was initially started on Imuran in attempt to prevent further relapse but was only on the drug for about a month when she was admitted to the hospital with severe back pain in November 2012 and was found to have an epidural abscess requiring emergency decompressive laminectomy at L1-L4 and evacuation of the epidural abscess. Imuran was stopped and not resumed.  She has been monitored with monthly blood counts. She never had a complete platelet recovery subsequent to the epidural abscess but the platelet count has been consistently above 100,000 and hemoglobin normalized. She came in for routine blood counts on Tuesday, 07/02/2013 and platelet count was 100,000 with a trend for slow decrease over the last 4 months from peak value of 140,000 on 03/26/2013. Hemoglobin normal at 14.7 g. I had her come back in to check bilirubin LDH and reticulocyte count. Reticulocyte count 2.2%. Total bilirubin 0.4. LDH 272, lab normal up to 245. Repeat value of 256 on 06/04/2013. Review of the peripheral blood showed spherocytes but no schistocytes. Platelets mildly decreased. I was concerned that she was experiencing an early relapse of her TTP. She was advised to start on prednisone 60 mg daily. I made arrangements to begin another course of Rituxan 375 mg per meter squared weekly  for the next 4 weeks. She received the first dose on 71/09/2013. She came to our office this morning to receive the second scheduled dose. CBC shows a further fall in her platelet count down to 63,000. Hemoglobin stable at 15.3. When she was here last week, I obtained lab for 71AM-TS enzyme level. Results returned with an undetectable level consistent with a relapse of her TTP. She was admitted on 07/12/2013 to resume plasma exchange. Examination on admission was unremarkable. Afebrile. No focal neurologic deficits.  Vascular surgery consultation was obtained. A vascular catheter was placed in the left subclavian position without any complications. She began immediate plasma exchange on the same day March 13. She received daily plasma exchange x6.platelet count 63,000, hemoglobin 15,LDH 345 on admission. Initial fall in platelet count but no lower than 7,000 on March 14 up to a discharge value of 115,000 on March 18. Hemoglobin fell down to 12.2. No transfusion support was necessary.bilirubin was normal at  the time of admission 0.5 and remained normal. LDH normalized down to 90 by March 15 been slightly increased to 333 on day of discharge March 18. She tolerated plasma exchange without any problems. She was given a daily dose of Solu-Medrol 80 mg IV as part of her total treatment. There were no complications. Since she showed a evidence for early response and rise in platelet count to over 100,000, I felt she was stable for discharge to continue plasma exchange as an outpatient.  She is a long-standing cigarette smoker. I have tried on multiple occasions to get her to stop. She was transitioned to a nicotine transdermal patch during this admission which will be continued  as an outpatient.   Examined on the day of discharge: She is alert and oriented Blood pressure 116/57, pulse 78 regular, respirations 19, temperature 98.3 oral Lungs clear and resonant to percussion Regular cardiac rhythm no  murmur Vascular catheter left subclavian position no erythema exudate or tenderness. Wound dressing dry. Abdomen soft and nontender Extremities no edema no calf tenderness Neurologic PERRLA, motor strength 5 over 5,    Discharge Labs:   Recent Labs Lab 07/14/13 0547  07/15/13 0855  07/16/13 2041 07/17/13 0352 07/17/13 0806  WBC 10.6*  --  16.3*  --  14.4* 13.2*  --   HGB 13.4  < > 12.7  < > 12.8 12.2 12.2  HCT 39.3  < > 37.2  < > 37.8 36.0 36.0  PLT 73*  --  87*  --  113* 115*  --   NEUTOPHILPCT 82*  --  79*  --   --  93*  --   MONOPCT 8  --  9  --   --  3  --   < > = values in this interval not displayed.  Recent Labs Lab 07/14/13 0547  07/15/13 0855 07/16/13 0908 07/17/13 0352 07/17/13 0806  NA 141  < > 141 142 143 144  K 4.8  < > 4.2 3.7 4.4 3.8  CL 102  < > 95* 96 101 99  CO2 29  --  30  --  28  --   BUN 29*  < > 29* 33* 40* 35*  CREATININE 0.95  < > 0.94 1.10 0.93 1.10  CALCIUM 9.1  --  8.9  --  9.0  --   PROT 5.5*  --  5.2*  --  5.5*  --   BILITOT 0.3  --  0.3  --  0.3  --   ALKPHOS 64  --  57  --  85  --   ALT 16  --  15  --  21  --   AST 19  --  17  --  22  --   GLUCOSE 117*  < > 89 124* 158* 113*  < > = values in this interval not displayed.     Consults: vascular surgery   Procedures:  #1.placement of left subclavian vascular catheter  #2. Plasma exchange daily x6   Discharge Diagnoses: #1. Third relapse of TTP #2. Obstructive airway disease #3.tobacco addiction #4. History of enterococcus epidural abscess with associated endocarditis November 2012 #5. History of depression #6. Degenerative arthritis of the spine #7. Peripheral neuropathy   Disposition: Condition is stable at time of discharge. She will resume light activity. Regular diet. She will report to the [facility] hemodialysis unit on Thursday, March 19. I will taper her plasma exchange schedule to every other day for 3 additional exchanges and then reevaluate. Followup in my  office on Monday, March 23. She is instructed to resume her oral prednisone 60 mg daily but not to take a dose on the day that she gets plasma exchange and she will receive a dose intravenously. On the other medication changes the addition of a nicotine Transderm patch. A course of Rituxan was initiated on the Friday prior to admission. This will be reinstituted for an additional plan 3 doses to begin on Friday, March 20.   Current Discharge Medication List    START taking these medications   Details  nicotine (NICODERM CQ - DOSED IN MG/24 HOURS) 21 mg/24hr patch Place 1 patch (21 mg total) onto  the skin daily. Qty: 28 patch, Refills: 0    oxyCODONE-acetaminophen (PERCOCET/ROXICET) 5-325 MG per tablet Take 2 tablets by mouth every 6 (six) hours as needed for moderate pain or severe pain. Qty: 30 tablet, Refills: 0   Associated Diagnoses: TTP (thrombotic thrombocytopenic purpura)      CONTINUE these medications which have NOT CHANGED   Details  cholecalciferol (VITAMIN D) 400 UNITS TABS Take 400 Units by mouth.    cyanocobalamin 100 MCG tablet Take 1,000 mcg by mouth daily.    FLUoxetine (PROZAC) 20 MG capsule Take 20 mg by mouth daily.     folic acid (FOLVITE) 1 MG tablet Take 1 mg by mouth daily.    gabapentin (NEURONTIN) 400 MG capsule Take 400 mg by mouth 3 (three) times daily.     Multiple Vitamin (MULTIVITAMIN) tablet Take 1 tablet by mouth 2 (two) times daily.     tiotropium (SPIRIVA) 18 MCG inhalation capsule Place 18 mcg into inhaler and inhale daily.      predniSONE (DELTASONE) 20 MG tablet Take 60 mg by mouth daily with breakfast. Take 3 daily with food.      STOP taking these medications     rosuvastatin (CRESTOR) 20 MG tablet      traMADol (ULTRAM) 50 MG tablet             Signed: Lenita Peregrina M 07/17/2013, 8:28 AM

## 2013-07-17 NOTE — Progress Notes (Signed)
Plasma exchange #6 initiated without complications.  Arterial and venous catheter ports aspirated and flushed without difficulty.  Pt is without complaint.

## 2013-07-18 ENCOUNTER — Other Ambulatory Visit: Payer: Self-pay | Admitting: Oncology

## 2013-07-18 ENCOUNTER — Non-Acute Institutional Stay (HOSPITAL_COMMUNITY)
Admission: AD | Admit: 2013-07-18 | Discharge: 2013-07-18 | Disposition: A | Discharge status: Home / Self Care | Payer: Medicare Other | Source: Ambulatory Visit | Attending: Oncology | Admitting: Oncology

## 2013-07-18 ENCOUNTER — Encounter: Payer: Self-pay | Admitting: Oncology

## 2013-07-18 DIAGNOSIS — M311 Thrombotic microangiopathy, unspecified: Secondary | ICD-10-CM | POA: Insufficient documentation

## 2013-07-18 DIAGNOSIS — M3119 Other thrombotic microangiopathy: Secondary | ICD-10-CM

## 2013-07-18 LAB — THERAPEUTIC PLASMA EXCHANGE (BLOOD BANK)
PLASMA VOLUME NEEDED: 2600
Plasma Exchange: 2600
UNIT DIVISION: 0
UNIT DIVISION: 0
UNIT DIVISION: 0
UNIT DIVISION: 0
UNIT DIVISION: 0
UNIT DIVISION: 0
Unit division: 0
Unit division: 0
Unit division: 0
Unit division: 0
Unit division: 0
Unit division: 0

## 2013-07-18 LAB — POCT I-STAT, CHEM 8
BUN: 37 mg/dL — AB (ref 6–23)
CALCIUM ION: 1.24 mmol/L (ref 1.13–1.30)
CHLORIDE: 100 meq/L (ref 96–112)
Creatinine, Ser: 1.2 mg/dL — ABNORMAL HIGH (ref 0.50–1.10)
Glucose, Bld: 130 mg/dL — ABNORMAL HIGH (ref 70–99)
HCT: 37 % (ref 36.0–46.0)
Hemoglobin: 12.6 g/dL (ref 12.0–15.0)
POTASSIUM: 3.3 meq/L — AB (ref 3.7–5.3)
Sodium: 143 mEq/L (ref 137–147)
TCO2: 27 mmol/L (ref 0–100)

## 2013-07-18 LAB — CBC WITH DIFFERENTIAL/PLATELET
BASOS PCT: 0 % (ref 0–1)
Basophils Absolute: 0 10*3/uL (ref 0.0–0.1)
Eosinophils Absolute: 0 10*3/uL (ref 0.0–0.7)
Eosinophils Relative: 0 % (ref 0–5)
HEMATOCRIT: 37 % (ref 36.0–46.0)
Hemoglobin: 12.6 g/dL (ref 12.0–15.0)
Lymphocytes Relative: 12 % (ref 12–46)
Lymphs Abs: 1.7 10*3/uL (ref 0.7–4.0)
MCH: 31.7 pg (ref 26.0–34.0)
MCHC: 34.1 g/dL (ref 30.0–36.0)
MCV: 93.2 fL (ref 78.0–100.0)
Monocytes Absolute: 1.1 10*3/uL — ABNORMAL HIGH (ref 0.1–1.0)
Monocytes Relative: 8 % (ref 3–12)
Neutro Abs: 10.9 10*3/uL — ABNORMAL HIGH (ref 1.7–7.7)
Neutrophils Relative %: 79 % — ABNORMAL HIGH (ref 43–77)
Platelets: 137 10*3/uL — ABNORMAL LOW (ref 150–400)
RBC: 3.97 MIL/uL (ref 3.87–5.11)
RDW: 13.8 % (ref 11.5–15.5)
WBC: 13.7 10*3/uL — ABNORMAL HIGH (ref 4.0–10.5)

## 2013-07-18 LAB — RETICULOCYTES
RBC.: 3.97 MIL/uL (ref 3.87–5.11)
RETIC COUNT ABSOLUTE: 91.3 10*3/uL (ref 19.0–186.0)
Retic Ct Pct: 2.3 % (ref 0.4–3.1)

## 2013-07-18 MED ORDER — DIPHENHYDRAMINE HCL 25 MG PO CAPS
ORAL_CAPSULE | ORAL | Status: AC
  Filled 2013-07-18: qty 1

## 2013-07-18 MED ORDER — ACETAMINOPHEN 325 MG PO TABS: 650.0000 mg | ORAL_TABLET | ORAL | Status: DC | PRN

## 2013-07-18 MED ORDER — DIPHENHYDRAMINE HCL 25 MG PO CAPS: 25.0000 mg | ORAL_CAPSULE | Freq: Four times a day (QID) | ORAL | Status: DC | PRN

## 2013-07-18 MED ORDER — CALCIUM CARBONATE ANTACID 500 MG PO CHEW
CHEWABLE_TABLET | ORAL | Status: AC
  Administered 2013-07-18: 2
  Filled 2013-07-18: qty 2

## 2013-07-18 MED ORDER — SODIUM CHLORIDE 0.9 % IV SOLN
4.0000 g | Freq: Once | INTRAVENOUS | Status: AC
  Administered 2013-07-18: 4 g via INTRAVENOUS
  Filled 2013-07-18: qty 40

## 2013-07-18 MED ORDER — ACD FORMULA A 0.73-2.45-2.2 GM/100ML VI SOLN: 500.0000 mL | Status: DC

## 2013-07-18 MED ORDER — ANTICOAGULANT SODIUM CITRATE 4% (200MG/5ML) IV SOLN: 5.0000 mL | Freq: Once | Status: DC

## 2013-07-18 MED ORDER — METHYLPREDNISOLONE SODIUM SUCC 125 MG IJ SOLR: 80.0000 mg | Freq: Once | INTRAMUSCULAR | Status: DC

## 2013-07-18 MED ORDER — CALCIUM CARBONATE ANTACID 500 MG PO CHEW
2.0000 | CHEWABLE_TABLET | ORAL | Status: DC
  Administered 2013-07-18: 400 mg via ORAL

## 2013-07-18 MED ORDER — CALCIUM CARBONATE ANTACID 500 MG PO CHEW: 2.0000 | CHEWABLE_TABLET | ORAL | Status: DC

## 2013-07-18 MED ORDER — CALCIUM GLUCONATE 10 % IV SOLN: 4.0000 g | Freq: Once | INTRAVENOUS | Status: DC

## 2013-07-18 MED ORDER — DIPHENHYDRAMINE HCL 25 MG PO CAPS
ORAL_CAPSULE | ORAL | Status: AC
  Administered 2013-07-18: 14:00:00
  Filled 2013-07-18: qty 1

## 2013-07-18 MED ORDER — ANTICOAGULANT SODIUM CITRATE 4% (200MG/5ML) IV SOLN
5.0000 mL | Freq: Once | Status: DC
  Filled 2013-07-18: qty 250

## 2013-07-18 MED ORDER — ACD FORMULA A 0.73-2.45-2.2 GM/100ML VI SOLN
Status: AC
  Administered 2013-07-18: 500 mL
  Filled 2013-07-18: qty 500

## 2013-07-18 NOTE — Progress Notes (Signed)
Faxed imuran prescription to [facility]

## 2013-07-19 ENCOUNTER — Ambulatory Visit (HOSPITAL_BASED_OUTPATIENT_CLINIC_OR_DEPARTMENT_OTHER): Payer: Medicare Other

## 2013-07-19 ENCOUNTER — Other Ambulatory Visit: Payer: Self-pay | Admitting: *Deleted

## 2013-07-19 ENCOUNTER — Other Ambulatory Visit: Payer: Self-pay | Admitting: Oncology

## 2013-07-19 ENCOUNTER — Other Ambulatory Visit (HOSPITAL_BASED_OUTPATIENT_CLINIC_OR_DEPARTMENT_OTHER): Payer: Medicare Other

## 2013-07-19 VITALS — BP 109/56 | HR 94 | Temp 99.1°F | Resp 18

## 2013-07-19 DIAGNOSIS — M3119 Other thrombotic microangiopathy: Secondary | ICD-10-CM

## 2013-07-19 DIAGNOSIS — Z862 Personal history of diseases of the blood and blood-forming organs and certain disorders involving the immune mechanism: Secondary | ICD-10-CM

## 2013-07-19 DIAGNOSIS — M311 Thrombotic microangiopathy, unspecified: Secondary | ICD-10-CM

## 2013-07-19 DIAGNOSIS — Z5112 Encounter for antineoplastic immunotherapy: Secondary | ICD-10-CM

## 2013-07-19 LAB — THERAPEUTIC PLASMA EXCHANGE (BLOOD BANK)
Plasma volume needed: 2600
UNIT DIVISION: 0
UNIT DIVISION: 0
UNIT DIVISION: 0
UNIT DIVISION: 0
Unit division: 0
Unit division: 0
Unit division: 0
Unit division: 0
Unit division: 0
Unit division: 0

## 2013-07-19 LAB — CBC & DIFF AND RETIC
BASO%: 0.1 % (ref 0.0–2.0)
BASOS ABS: 0 10*3/uL (ref 0.0–0.1)
EOS%: 0.4 % (ref 0.0–7.0)
Eosinophils Absolute: 0.1 10*3/uL (ref 0.0–0.5)
HEMATOCRIT: 40 % (ref 34.8–46.6)
HEMOGLOBIN: 13.3 g/dL (ref 11.6–15.9)
IMMATURE RETIC FRACT: 2.2 % (ref 1.60–10.00)
LYMPH%: 5.6 % — AB (ref 14.0–49.7)
MCH: 31.1 pg (ref 25.1–34.0)
MCHC: 33.3 g/dL (ref 31.5–36.0)
MCV: 93.7 fL (ref 79.5–101.0)
MONO#: 0.3 10*3/uL (ref 0.1–0.9)
MONO%: 2.9 % (ref 0.0–14.0)
NEUT#: 10.2 10*3/uL — ABNORMAL HIGH (ref 1.5–6.5)
NEUT%: 91 % — AB (ref 38.4–76.8)
Platelets: 149 10*3/uL (ref 145–400)
RBC: 4.27 10*6/uL (ref 3.70–5.45)
RDW: 13.8 % (ref 11.2–14.5)
RETIC CT ABS: 82.84 10*3/uL (ref 33.70–90.70)
Retic %: 1.94 % (ref 0.70–2.10)
WBC: 11.2 10*3/uL — AB (ref 3.9–10.3)
lymph#: 0.6 10*3/uL — ABNORMAL LOW (ref 0.9–3.3)
nRBC: 0 % (ref 0–0)

## 2013-07-19 LAB — LACTATE DEHYDROGENASE (CC13): LDH: 287 U/L — ABNORMAL HIGH (ref 125–245)

## 2013-07-19 LAB — COMPREHENSIVE METABOLIC PANEL (CC13)
ALBUMIN: 3.5 g/dL (ref 3.5–5.0)
ALT: 31 U/L (ref 0–55)
ANION GAP: 13 meq/L — AB (ref 3–11)
AST: 23 U/L (ref 5–34)
Alkaline Phosphatase: 68 U/L (ref 40–150)
BUN: 31.5 mg/dL — ABNORMAL HIGH (ref 7.0–26.0)
CHLORIDE: 102 meq/L (ref 98–109)
CO2: 28 meq/L (ref 22–29)
CREATININE: 1.1 mg/dL (ref 0.6–1.1)
Calcium: 9.1 mg/dL (ref 8.4–10.4)
Glucose: 144 mg/dl — ABNORMAL HIGH (ref 70–140)
POTASSIUM: 4.2 meq/L (ref 3.5–5.1)
SODIUM: 143 meq/L (ref 136–145)
TOTAL PROTEIN: 6.1 g/dL — AB (ref 6.4–8.3)
Total Bilirubin: 0.72 mg/dL (ref 0.20–1.20)

## 2013-07-19 LAB — MORPHOLOGY: PLT EST: ADEQUATE

## 2013-07-19 LAB — CHCC SMEAR

## 2013-07-19 MED ORDER — SODIUM CHLORIDE 0.9 % IJ SOLN
10.0000 mL | INTRAMUSCULAR | Status: DC | PRN
  Administered 2013-07-19: 10 mL
  Filled 2013-07-19: qty 10

## 2013-07-19 MED ORDER — ANTICOAGULANT SODIUM CITRATE 4% (200MG/5ML) IV SOLN
5.0000 mL | Status: AC | PRN
  Administered 2013-07-19: 2.6 mL
  Filled 2013-07-19: qty 5

## 2013-07-19 MED ORDER — ACETAMINOPHEN 325 MG PO TABS
ORAL_TABLET | ORAL | Status: AC
  Filled 2013-07-19: qty 2

## 2013-07-19 MED ORDER — HEPARIN SOD (PORK) LOCK FLUSH 100 UNIT/ML IV SOLN
250.0000 [IU] | Freq: Once | INTRAVENOUS | Status: AC | PRN
  Filled 2013-07-19: qty 5

## 2013-07-19 MED ORDER — SODIUM CHLORIDE 0.9 % IV SOLN: 375.0000 mg/m2 | Freq: Once | INTRAVENOUS | Status: DC

## 2013-07-19 MED ORDER — DIPHENHYDRAMINE HCL 25 MG PO CAPS
ORAL_CAPSULE | ORAL | Status: AC
  Filled 2013-07-19: qty 2

## 2013-07-19 MED ORDER — ACETAMINOPHEN 325 MG PO TABS
650.0000 mg | ORAL_TABLET | Freq: Once | ORAL | Status: AC
  Administered 2013-07-19: 650 mg via ORAL

## 2013-07-19 MED ORDER — DIPHENHYDRAMINE HCL 25 MG PO CAPS
50.0000 mg | ORAL_CAPSULE | Freq: Once | ORAL | Status: AC
  Administered 2013-07-19: 50 mg via ORAL

## 2013-07-19 MED ORDER — RITUXIMAB CHEMO INJECTION 10 MG/ML
375.0000 mg/m2 | Freq: Once | INTRAVENOUS | Status: AC
  Administered 2013-07-19: 700 mg via INTRAVENOUS
  Filled 2013-07-19: qty 70

## 2013-07-19 NOTE — Patient Instructions (Signed)

## 2013-07-20 ENCOUNTER — Non-Acute Institutional Stay (HOSPITAL_COMMUNITY)
Admission: EM | Admit: 2013-07-20 | Discharge: 2013-07-20 | Disposition: A | Discharge status: Home / Self Care | Payer: Medicare Other | Source: Other Acute Inpatient Hospital | Attending: Oncology | Admitting: Oncology

## 2013-07-20 DIAGNOSIS — M311 Thrombotic microangiopathy, unspecified: Secondary | ICD-10-CM | POA: Insufficient documentation

## 2013-07-20 DIAGNOSIS — M3119 Other thrombotic microangiopathy: Secondary | ICD-10-CM

## 2013-07-20 LAB — POCT I-STAT, CHEM 8
BUN: 22 mg/dL (ref 6–23)
BUN: 24 mg/dL — ABNORMAL HIGH (ref 6–23)
CALCIUM ION: 1.19 mmol/L (ref 1.13–1.30)
CREATININE: 0.9 mg/dL (ref 0.50–1.10)
CREATININE: 1 mg/dL (ref 0.50–1.10)
Chloride: 100 mEq/L (ref 96–112)
Chloride: 102 mEq/L (ref 96–112)
GLUCOSE: 81 mg/dL (ref 70–99)
GLUCOSE: 82 mg/dL (ref 70–99)
HCT: 35 % — ABNORMAL LOW (ref 36.0–46.0)
HEMATOCRIT: 35 % — AB (ref 36.0–46.0)
Hemoglobin: 11.9 g/dL — ABNORMAL LOW (ref 12.0–15.0)
Hemoglobin: 11.9 g/dL — ABNORMAL LOW (ref 12.0–15.0)
POTASSIUM: 3.4 meq/L — AB (ref 3.7–5.3)
POTASSIUM: 3.7 meq/L (ref 3.7–5.3)
Sodium: 141 mEq/L (ref 137–147)
Sodium: 146 mEq/L (ref 137–147)
TCO2: 23 mmol/L (ref 0–100)
TCO2: 23 mmol/L (ref 0–100)

## 2013-07-20 MED ORDER — ACD FORMULA A 0.73-2.45-2.2 GM/100ML VI SOLN
500.0000 mL | Status: DC
  Administered 2013-07-20: 500 mL via INTRAVENOUS

## 2013-07-20 MED ORDER — DIPHENHYDRAMINE HCL 25 MG PO CAPS
25.0000 mg | ORAL_CAPSULE | Freq: Four times a day (QID) | ORAL | Status: DC | PRN
  Administered 2013-07-20: 25 mg via ORAL

## 2013-07-20 MED ORDER — ACD FORMULA A 0.73-2.45-2.2 GM/100ML VI SOLN
Status: AC
  Administered 2013-07-20: 500 mL via INTRAVENOUS
  Filled 2013-07-20: qty 500

## 2013-07-20 MED ORDER — ANTICOAGULANT SODIUM CITRATE 4% (200MG/5ML) IV SOLN
5.0000 mL | Freq: Once | Status: AC
  Administered 2013-07-20: 5 mL
  Filled 2013-07-20: qty 250

## 2013-07-20 MED ORDER — CALCIUM CARBONATE ANTACID 500 MG PO CHEW
CHEWABLE_TABLET | ORAL | Status: AC
  Administered 2013-07-20: 400 mg via ORAL
  Filled 2013-07-20: qty 4

## 2013-07-20 MED ORDER — METHYLPREDNISOLONE SODIUM SUCC 125 MG IJ SOLR
80.0000 mg | Freq: Once | INTRAMUSCULAR | Status: AC
  Administered 2013-07-20: 80 mg via INTRAVENOUS

## 2013-07-20 MED ORDER — METHYLPREDNISOLONE SODIUM SUCC 125 MG IJ SOLR
INTRAMUSCULAR | Status: AC
  Administered 2013-07-20: 80 mg via INTRAVENOUS
  Filled 2013-07-20: qty 2

## 2013-07-20 MED ORDER — DIPHENHYDRAMINE HCL 25 MG PO CAPS
ORAL_CAPSULE | ORAL | Status: AC
  Administered 2013-07-20: 25 mg via ORAL
  Filled 2013-07-20: qty 1

## 2013-07-20 MED ORDER — SODIUM CHLORIDE 0.9 % IV SOLN
4.0000 g | Freq: Once | INTRAVENOUS | Status: AC
  Administered 2013-07-20: 4 g via INTRAVENOUS
  Filled 2013-07-20: qty 40

## 2013-07-20 MED ORDER — ACETAMINOPHEN 325 MG PO TABS: 650.0000 mg | ORAL_TABLET | ORAL | Status: DC | PRN

## 2013-07-20 MED ORDER — CALCIUM CARBONATE ANTACID 500 MG PO CHEW
2.0000 | CHEWABLE_TABLET | Freq: Three times a day (TID) | ORAL | Status: AC
  Administered 2013-07-20 (×2): 400 mg via ORAL

## 2013-07-21 ENCOUNTER — Encounter: Payer: Self-pay | Admitting: Oncology

## 2013-07-21 ENCOUNTER — Other Ambulatory Visit: Payer: Self-pay | Admitting: Oncology

## 2013-07-21 LAB — THERAPEUTIC PLASMA EXCHANGE (BLOOD BANK)
Plasma Exchange: 2600
Plasma volume needed: 2600
UNIT DIVISION: 0
UNIT DIVISION: 0
UNIT DIVISION: 0
Unit division: 0
Unit division: 0
Unit division: 0
Unit division: 0
Unit division: 0
Unit division: 0
Unit division: 0

## 2013-07-21 NOTE — Progress Notes (Signed)
Hematology and Oncology Follow Up Visit  [name] [id number] [birth year] 73 y.o. 07/19/2013 1:49 PM   Principle Diagnosis: Third relapse of TTP   Interim History:  I had to admit the patient to the hospital again on March 13 for early 3rd relapse of TTP. She had a very slowly falling platelet count over the last few months and recurrent rise in serum LDH but no gross hemolysis with a stable hemoglobin. I sent off an Adam-TS enzyme analysis and the enzyme was not detectable consistent with relapsed TTP. I tried  to begin induction therapy with steroids and Rituxan as an outpatient but lab parameters continued to deteriorate and I admitted her to the hospital to begin plasma exchange. Please see my history and physical dated March 13 for further details. She underwent placement of a vascular catheter on the day of admission March 13 and began plasma exchange. She had a total of 6 daily exchanges in the hospital. Platelet count which fell as low as 57,000 and LDH which peaked at 345, both improved steadily with plasma exchange and parenteral steroids. I felt she was stable for discharge on March 18 to continue further plasma exchange as an outpatient. Platelet count was 115,000. LDH fluctuating down to 290 but was 333 on March 18. She remains asymptomatic at this time. She is in today to continue Rituxan.   Medications: reviewed  Allergies:  Allergies  Allergen Reactions  . Adhesive [Tape]     blisters  . Cefuroxime Axetil     "jittery"  . Other     Wool: Reaction is hives Statistician tape: Reaction is blistering   . Sulfa Antibiotics Hives  . Sulfa Drugs Cross Reactors     Review of Systems: Hematology:  No bleeding ENT ROS: No sore throat  Breast ROS:  Respiratory ROS: No cough or dyspnea at rest she continues to smoke Cardiovascular ROS:  No chest pain or palpitations Gastrointestinal ROS:   No abdominal pain or change in bowel habit Genito-Urinary ROS:  No urinary symptoms Musculoskeletal ROS: Chronic back pain from arthritis Neurological ROS: No headache or change in vision, no dysarthria, no focal weakness, no paresthesias Dermatological ROS: No rash Remaining ROS negative:   Physical Exam: There were no vitals taken for this visit. Wt Readings from Last 3 Encounters:  07/18/13 176 lb 12.9 oz (80.2 kg)  07/17/13 182 lb 5.1 oz (82.7 kg)  07/17/13 182 lb 5.1 oz (82.7 kg)     General appearance: Well-nourished Caucasian woman HENNT: Pharynx no erythema, exudate, mass, or ulcer. No thyromegaly or thyroid nodules Lymph nodes: No cervical, supraclavicular, or axillary lymphadenopathy Breasts:  Lungs: Clear to auscultation, resonant to percussion throughout Heart: Regular rhythm, 1-3/0 systolic murmur left sternal border murmur, no gallop, no rub, no click, no edema Abdomen: Soft, nontender, normal bowel sounds, no mass, no organomegaly Extremities: No edema, no calf tenderness Musculoskeletal: no joint deformities GU:  Vascular: Carotid pulses 2+, no bruits, vascular catheter left subclavian position. Ecchymosis of the surrounding skin. No bleeding or exudate at the entry site. Neurologic: Alert, oriented, PERRLA,   cranial nerves grossly normal, motor strength 5 over 5, reflexes 1+ symmetric, upper body coordination normal, gait normal, Skin: No rash or ecchymosis  Lab Results: CBC W/Diff    Component Value Date/Time   WBC 11.2* 07/19/2013 1338   WBC 13.7* 07/18/2013 1230   WBC 8.6 11/09/2007 1100   RBC 4.27 07/19/2013 1338   RBC 3.97 07/18/2013 1230   RBC  3.97 07/18/2013 1230   HGB 11.9* 07/20/2013 1042   HGB 13.3 07/19/2013 1338   HGB 14.4 11/09/2007 1100   HCT 35.0* 07/20/2013 1042   HCT 40.0 07/19/2013 1338   HCT 42.6 11/09/2007 1100   PLT 149 07/19/2013 1338   PLT 137* 07/18/2013 1230   PLT 233 11/09/2007 1100   MCV 93.7 07/19/2013 1338   MCV 93.2 07/18/2013 1230   MCV 90 11/09/2007 1100   MCH 31.1 07/19/2013 1338   MCH 31.7  07/18/2013 1230   MCH 30.3 11/09/2007 1100   MCHC 33.3 07/19/2013 1338   MCHC 34.1 07/18/2013 1230   MCHC 33.7 11/09/2007 1100   RDW 13.8 07/19/2013 1338   RDW 13.8 07/18/2013 1230   RDW 12.5 11/09/2007 1100   LYMPHSABS 0.6* 07/19/2013 1338   LYMPHSABS 1.7 07/18/2013 1230   LYMPHSABS 2.5 11/09/2007 1100   MONOABS 0.3 07/19/2013 1338   MONOABS 1.1* 07/18/2013 1230   EOSABS 0.1 07/19/2013 1338   EOSABS 0.0 07/18/2013 1230   EOSABS 0.1 11/09/2007 1100   BASOSABS 0.0 07/19/2013 1338   BASOSABS 0.0 07/18/2013 1230   BASOSABS 0.1 11/09/2007 1100     Chemistry      Component Value Date/Time   NA 141 07/20/2013 1042   NA 143 07/19/2013 1339   K 3.7 07/20/2013 1042   K 4.2 07/19/2013 1339   CL 102 07/20/2013 1042   CL 107 05/22/2012 1023   CO2 28 07/19/2013 1339   CO2 28 07/17/2013 0352   BUN 24* 07/20/2013 1042   BUN 31.5* 07/19/2013 1339   CREATININE 1.00 07/20/2013 1042   CREATININE 1.1 07/19/2013 1339   CREATININE 1.52* 05/05/2011 1117      Component Value Date/Time   CALCIUM 9.1 07/19/2013 1339   CALCIUM 9.0 07/17/2013 0352   ALKPHOS 68 07/19/2013 1339   ALKPHOS 85 07/17/2013 0352   AST 23 07/19/2013 1339   AST 22 07/17/2013 0352   ALT 31 07/19/2013 1339   ALT 21 07/17/2013 0352   BILITOT 0.72 07/19/2013 1339   BILITOT 0.3 07/17/2013 0352       Radiological Studies: Dg Chest Port 1 View  07/12/2013   CLINICAL DATA:  Placement of dialysis catheter.  EXAM: PORTABLE CHEST - 1 VIEW  COMPARISON:  03/2011  FINDINGS: A left subclavian dialysis catheter is present. Catheter tip is near the superior cavoatrial junction. No evidence for a pneumothorax. Lungs are clear without airspace disease or significant edema. Heart size is normal.  IMPRESSION: Left subclavian dialysis catheter. Catheter tip at the cavoatrial junction. Negative for a pneumothorax.   Electronically Signed   By: Markus Daft M.D.   On: 07/12/2013 16:32   Dg Fluoro Guide Cv Line-no Report  07/12/2013   CLINICAL DATA: dialysis catheter   FLOURO GUIDE  CV LINE  Fluoroscopy was utilized by the requesting physician.  No radiographic  interpretation.     Impression:  Multiply relapsed TTP. I have changed her to every other day plasma exchange. Platelet count today is 149,000. She'll get a plasma exchange tomorrow March 21 and again on Monday, March 23 and then reevaluate. Daily Solu-Medrol 80 mg IV with plasma exchange. Prednisone 60 mg by mouth on days that she is not getting an exchange. Continue weekly Rituxan for 2 additional doses after today. I would like to add another immunosuppressive drug. Apparently there is a Producer, television/film/video on Imuran. If I am not able to get this drug then I will put her on oral low dose Cytoxan  CC: Patient Care Team: Kirk Ruths, MD as PCP - General (Unknown Physician Specialty)   Annia Belt, MD 3/22/20151:49 PM

## 2013-07-22 ENCOUNTER — Non-Acute Institutional Stay (HOSPITAL_COMMUNITY)
Admission: AD | Admit: 2013-07-22 | Discharge: 2013-07-22 | Disposition: A | Discharge status: Home / Self Care | Payer: Medicare Other | Source: Ambulatory Visit | Attending: Oncology | Admitting: Oncology

## 2013-07-22 ENCOUNTER — Other Ambulatory Visit: Payer: Self-pay | Admitting: Oncology

## 2013-07-22 DIAGNOSIS — M311 Thrombotic microangiopathy, unspecified: Secondary | ICD-10-CM | POA: Insufficient documentation

## 2013-07-22 DIAGNOSIS — M3119 Other thrombotic microangiopathy: Secondary | ICD-10-CM

## 2013-07-22 LAB — COMPREHENSIVE METABOLIC PANEL
ALT: 24 U/L (ref 0–35)
AST: 20 U/L (ref 0–37)
Albumin: 3.1 g/dL — ABNORMAL LOW (ref 3.5–5.2)
Alkaline Phosphatase: 66 U/L (ref 39–117)
BUN: 24 mg/dL — AB (ref 6–23)
CALCIUM: 8.6 mg/dL (ref 8.4–10.5)
CO2: 22 mEq/L (ref 19–32)
Chloride: 101 mEq/L (ref 96–112)
Creatinine, Ser: 0.88 mg/dL (ref 0.50–1.10)
GFR calc Af Amer: 75 mL/min — ABNORMAL LOW (ref >90)
GFR calc non Af Amer: 65 mL/min — ABNORMAL LOW (ref >90)
Glucose, Bld: 102 mg/dL — ABNORMAL HIGH (ref 70–99)
Potassium: 3.8 mEq/L (ref 3.7–5.3)
Sodium: 146 mEq/L (ref 137–147)
TOTAL PROTEIN: 5.7 g/dL — AB (ref 6.0–8.3)
Total Bilirubin: 0.4 mg/dL (ref 0.3–1.2)

## 2013-07-22 LAB — CBC WITH DIFFERENTIAL/PLATELET
BASOS PCT: 0 % (ref 0–1)
Basophils Absolute: 0 10*3/uL (ref 0.0–0.1)
EOS ABS: 0 10*3/uL (ref 0.0–0.7)
EOS PCT: 0 % (ref 0–5)
HCT: 33.1 % — ABNORMAL LOW (ref 36.0–46.0)
HEMOGLOBIN: 11 g/dL — AB (ref 12.0–15.0)
LYMPHS ABS: 1.2 10*3/uL (ref 0.7–4.0)
Lymphocytes Relative: 13 % (ref 12–46)
MCH: 31.5 pg (ref 26.0–34.0)
MCHC: 33.2 g/dL (ref 30.0–36.0)
MCV: 94.8 fL (ref 78.0–100.0)
Monocytes Absolute: 0.5 10*3/uL (ref 0.1–1.0)
Monocytes Relative: 6 % (ref 3–12)
Neutro Abs: 7.5 10*3/uL (ref 1.7–7.7)
Neutrophils Relative %: 81 % — ABNORMAL HIGH (ref 43–77)
Platelets: 150 10*3/uL (ref 150–400)
RBC: 3.49 MIL/uL — AB (ref 3.87–5.11)
RDW: 14.5 % (ref 11.5–15.5)
WBC: 9.3 10*3/uL (ref 4.0–10.5)

## 2013-07-22 LAB — POCT I-STAT, CHEM 8
BUN: 23 mg/dL (ref 6–23)
CREATININE: 1.1 mg/dL (ref 0.50–1.10)
Calcium, Ion: 0.43 mmol/L — CL (ref 1.13–1.30)
Chloride: 100 mEq/L (ref 96–112)
Glucose, Bld: 99 mg/dL (ref 70–99)
HEMATOCRIT: 34 % — AB (ref 36.0–46.0)
HEMOGLOBIN: 11.6 g/dL — AB (ref 12.0–15.0)
Potassium: 3.6 mEq/L — ABNORMAL LOW (ref 3.7–5.3)
SODIUM: 144 meq/L (ref 137–147)
TCO2: 22 mmol/L (ref 0–100)

## 2013-07-22 LAB — RETICULOCYTES
RBC.: 3.49 MIL/uL — ABNORMAL LOW (ref 3.87–5.11)
RETIC COUNT ABSOLUTE: 97.7 10*3/uL (ref 19.0–186.0)
RETIC CT PCT: 2.8 % (ref 0.4–3.1)

## 2013-07-22 MED ORDER — CALCIUM CARBONATE ANTACID 500 MG PO CHEW
CHEWABLE_TABLET | ORAL | Status: AC
  Administered 2013-07-22: 400 mg via ORAL
  Filled 2013-07-22: qty 4

## 2013-07-22 MED ORDER — DIPHENHYDRAMINE HCL 25 MG PO CAPS
ORAL_CAPSULE | ORAL | Status: AC
  Administered 2013-07-22: 25 mg via ORAL
  Filled 2013-07-22: qty 1

## 2013-07-22 MED ORDER — METHYLPREDNISOLONE SODIUM SUCC 125 MG IJ SOLR
INTRAMUSCULAR | Status: AC
  Administered 2013-07-22: 80 mg
  Filled 2013-07-22: qty 2

## 2013-07-22 MED ORDER — SODIUM CHLORIDE 0.9 % IV SOLN
4.0000 g | Freq: Once | INTRAVENOUS | Status: AC
  Administered 2013-07-22: 4 g via INTRAVENOUS
  Filled 2013-07-22 (×3): qty 40

## 2013-07-22 MED ORDER — DIPHENHYDRAMINE HCL 25 MG PO CAPS
25.0000 mg | ORAL_CAPSULE | Freq: Four times a day (QID) | ORAL | Status: DC | PRN
  Administered 2013-07-22: 25 mg via ORAL

## 2013-07-22 MED ORDER — ACD FORMULA A 0.73-2.45-2.2 GM/100ML VI SOLN
Status: AC
  Administered 2013-07-22: 500 mL via INTRAVENOUS
  Filled 2013-07-22: qty 500

## 2013-07-22 MED ORDER — ACD FORMULA A 0.73-2.45-2.2 GM/100ML VI SOLN
500.0000 mL | Status: DC
  Administered 2013-07-22: 500 mL via INTRAVENOUS

## 2013-07-22 MED ORDER — ACETAMINOPHEN 325 MG PO TABS: 650.0000 mg | ORAL_TABLET | ORAL | Status: DC | PRN

## 2013-07-22 MED ORDER — ANTICOAGULANT SODIUM CITRATE 4% (200MG/5ML) IV SOLN
5.0000 mL | Freq: Once | Status: AC
  Administered 2013-07-22: 5 mL
  Filled 2013-07-22 (×2): qty 250

## 2013-07-22 MED ORDER — CALCIUM CARBONATE ANTACID 500 MG PO CHEW
2.0000 | CHEWABLE_TABLET | ORAL | Status: AC
  Administered 2013-07-22 (×2): 400 mg via ORAL

## 2013-07-22 NOTE — Progress Notes (Signed)
TPE- completed without issue. Pt received solumedrol 80mg  iv, tums, and benadryl pre tx with 4g ca gluconate replacement during tx. Tolerated well without issue. Confirmed with Dr. Azucena Freed office she doesn't have an appointment today. Pt discharged to home. Will await further orders from Dr. Beryle Beams on wether to continue plasma.

## 2013-07-23 ENCOUNTER — Other Ambulatory Visit: Payer: Self-pay | Admitting: Nurse Practitioner

## 2013-07-23 ENCOUNTER — Telehealth: Payer: Self-pay | Admitting: *Deleted

## 2013-07-23 ENCOUNTER — Other Ambulatory Visit: Payer: Self-pay | Admitting: *Deleted

## 2013-07-23 DIAGNOSIS — M3119 Other thrombotic microangiopathy: Secondary | ICD-10-CM

## 2013-07-23 DIAGNOSIS — M311 Thrombotic microangiopathy: Secondary | ICD-10-CM

## 2013-07-23 LAB — THERAPEUTIC PLASMA EXCHANGE (BLOOD BANK)
PLASMA VOLUME NEEDED: 2600
Plasma Exchange: 2600
UNIT DIVISION: 0
UNIT DIVISION: 0
UNIT DIVISION: 0
UNIT DIVISION: 0
UNIT DIVISION: 0
Unit division: 0
Unit division: 0
Unit division: 0
Unit division: 0
Unit division: 0

## 2013-07-23 MED ORDER — AZATHIOPRINE 50 MG PO TABS
ORAL_TABLET | ORAL | Status: DC
Start: 2013-07-23 — End: 2013-08-08

## 2013-07-23 MED ORDER — AZATHIOPRINE 50 MG PO TABS: 50.0000 mg | ORAL_TABLET | Freq: Every day | ORAL | Status: DC

## 2013-07-23 NOTE — Telephone Encounter (Signed)
Per desk RN and {POF I have scheduled flush

## 2013-07-24 ENCOUNTER — Other Ambulatory Visit (HOSPITAL_BASED_OUTPATIENT_CLINIC_OR_DEPARTMENT_OTHER): Payer: Medicare Other

## 2013-07-24 ENCOUNTER — Other Ambulatory Visit: Payer: Medicare Other

## 2013-07-24 ENCOUNTER — Telehealth: Payer: Self-pay | Admitting: *Deleted

## 2013-07-24 DIAGNOSIS — M3119 Other thrombotic microangiopathy: Secondary | ICD-10-CM

## 2013-07-24 DIAGNOSIS — M311 Thrombotic microangiopathy, unspecified: Secondary | ICD-10-CM

## 2013-07-24 LAB — CBC WITH DIFFERENTIAL/PLATELET
BASO%: 0.1 % (ref 0.0–2.0)
BASOS ABS: 0 10*3/uL (ref 0.0–0.1)
EOS ABS: 0 10*3/uL (ref 0.0–0.5)
EOS%: 0 % (ref 0.0–7.0)
HCT: 37.3 % (ref 34.8–46.6)
HEMOGLOBIN: 12.5 g/dL (ref 11.6–15.9)
LYMPH%: 7.7 % — ABNORMAL LOW (ref 14.0–49.7)
MCH: 31.7 pg (ref 25.1–34.0)
MCHC: 33.4 g/dL (ref 31.5–36.0)
MCV: 94.8 fL (ref 79.5–101.0)
MONO#: 0.6 10*3/uL (ref 0.1–0.9)
MONO%: 5.5 % (ref 0.0–14.0)
NEUT%: 86.7 % — ABNORMAL HIGH (ref 38.4–76.8)
NEUTROS ABS: 9.4 10*3/uL — AB (ref 1.5–6.5)
PLATELETS: 187 10*3/uL (ref 145–400)
RBC: 3.93 10*6/uL (ref 3.70–5.45)
RDW: 14.3 % (ref 11.2–14.5)
WBC: 10.9 10*3/uL — ABNORMAL HIGH (ref 3.9–10.3)
lymph#: 0.8 10*3/uL — ABNORMAL LOW (ref 0.9–3.3)

## 2013-07-24 NOTE — Telephone Encounter (Signed)
Called and spoke with pt's husband and reminded him to pick-up new medicine (Imuran) at drug store and have pt start taking today.  Pt's husband stated he just did pick up today and pt is sleeping now and will start taking today.

## 2013-07-25 ENCOUNTER — Telehealth: Payer: Self-pay | Admitting: *Deleted

## 2013-07-25 NOTE — Telephone Encounter (Signed)
Pt notified of platelet results & to hold plasma exchange for now & maintain catheter & come in for rituxan fri.  She was in agreement.

## 2013-07-25 NOTE — Telephone Encounter (Signed)
Message copied by Jesse Fall on Thu Jul 25, 2013  1:46 PM ------      Message from: Annia Belt      Created: Thu Jul 25, 2013 10:11 AM       Call pt platelets up to 187,000  We can stop plasma exchange for now  Leave catheter in place  Come in for Rituxan Friday as scheduled ------

## 2013-07-26 ENCOUNTER — Other Ambulatory Visit (HOSPITAL_BASED_OUTPATIENT_CLINIC_OR_DEPARTMENT_OTHER): Payer: Medicare Other

## 2013-07-26 ENCOUNTER — Other Ambulatory Visit: Payer: Medicare Other

## 2013-07-26 ENCOUNTER — Ambulatory Visit (HOSPITAL_BASED_OUTPATIENT_CLINIC_OR_DEPARTMENT_OTHER): Payer: Medicare Other

## 2013-07-26 VITALS — BP 140/66 | HR 78 | Temp 97.3°F | Resp 16

## 2013-07-26 DIAGNOSIS — Z5112 Encounter for antineoplastic immunotherapy: Secondary | ICD-10-CM

## 2013-07-26 DIAGNOSIS — M311 Thrombotic microangiopathy, unspecified: Secondary | ICD-10-CM

## 2013-07-26 DIAGNOSIS — M3119 Other thrombotic microangiopathy: Secondary | ICD-10-CM

## 2013-07-26 DIAGNOSIS — Z862 Personal history of diseases of the blood and blood-forming organs and certain disorders involving the immune mechanism: Secondary | ICD-10-CM

## 2013-07-26 LAB — CBC & DIFF AND RETIC
BASO%: 0.2 % (ref 0.0–2.0)
Basophils Absolute: 0 10*3/uL (ref 0.0–0.1)
EOS ABS: 0 10*3/uL (ref 0.0–0.5)
EOS%: 0.2 % (ref 0.0–7.0)
HCT: 38.8 % (ref 34.8–46.6)
HGB: 12.7 g/dL (ref 11.6–15.9)
Immature Retic Fract: 13.3 % — ABNORMAL HIGH (ref 1.60–10.00)
LYMPH%: 6.8 % — AB (ref 14.0–49.7)
MCH: 31.3 pg (ref 25.1–34.0)
MCHC: 32.7 g/dL (ref 31.5–36.0)
MCV: 95.6 fL (ref 79.5–101.0)
MONO#: 0.8 10*3/uL (ref 0.1–0.9)
MONO%: 6.4 % (ref 0.0–14.0)
NEUT#: 10.1 10*3/uL — ABNORMAL HIGH (ref 1.5–6.5)
NEUT%: 86.4 % — AB (ref 38.4–76.8)
NRBC: 0 % (ref 0–0)
PLATELETS: 157 10*3/uL (ref 145–400)
RBC: 4.06 10*6/uL (ref 3.70–5.45)
RDW: 14.8 % — ABNORMAL HIGH (ref 11.2–14.5)
Retic %: 4.98 % — ABNORMAL HIGH (ref 0.70–2.10)
Retic Ct Abs: 202.19 10*3/uL — ABNORMAL HIGH (ref 33.70–90.70)
WBC: 11.6 10*3/uL — ABNORMAL HIGH (ref 3.9–10.3)
lymph#: 0.8 10*3/uL — ABNORMAL LOW (ref 0.9–3.3)

## 2013-07-26 LAB — LACTATE DEHYDROGENASE (CC13): LDH: 301 U/L — AB (ref 125–245)

## 2013-07-26 LAB — COMPREHENSIVE METABOLIC PANEL (CC13)
ALK PHOS: 67 U/L (ref 40–150)
ALT: 27 U/L (ref 0–55)
ANION GAP: 10 meq/L (ref 3–11)
AST: 15 U/L (ref 5–34)
Albumin: 3.4 g/dL — ABNORMAL LOW (ref 3.5–5.0)
BUN: 35.1 mg/dL — AB (ref 7.0–26.0)
CO2: 22 mEq/L (ref 22–29)
CREATININE: 0.9 mg/dL (ref 0.6–1.1)
Calcium: 8.7 mg/dL (ref 8.4–10.4)
Chloride: 108 mEq/L (ref 98–109)
Glucose: 110 mg/dl (ref 70–140)
Potassium: 4.6 mEq/L (ref 3.5–5.1)
Sodium: 140 mEq/L (ref 136–145)
Total Bilirubin: 0.4 mg/dL (ref 0.20–1.20)
Total Protein: 5.8 g/dL — ABNORMAL LOW (ref 6.4–8.3)

## 2013-07-26 LAB — MORPHOLOGY: PLT EST: ADEQUATE

## 2013-07-26 MED ORDER — ANTICOAGULANT SODIUM CITRATE 4% (200MG/5ML) IV SOLN
5.0000 mL | Freq: Once | Status: AC
  Administered 2013-07-26: 5 mL via INTRAVENOUS
  Filled 2013-07-26: qty 5

## 2013-07-26 MED ORDER — SODIUM CHLORIDE 0.9 % IJ SOLN
10.0000 mL | INTRAMUSCULAR | Status: DC | PRN
  Administered 2013-07-26: 10 mL
  Filled 2013-07-26: qty 10

## 2013-07-26 MED ORDER — SODIUM CHLORIDE 0.9 % IV SOLN
375.0000 mg/m2 | Freq: Once | INTRAVENOUS | Status: AC
  Administered 2013-07-26: 700 mg via INTRAVENOUS
  Filled 2013-07-26: qty 70

## 2013-07-26 MED ORDER — DIPHENHYDRAMINE HCL 25 MG PO CAPS
50.0000 mg | ORAL_CAPSULE | Freq: Once | ORAL | Status: AC
  Administered 2013-07-26: 50 mg via ORAL

## 2013-07-26 MED ORDER — ACETAMINOPHEN 325 MG PO TABS
ORAL_TABLET | ORAL | Status: AC
  Filled 2013-07-26: qty 2

## 2013-07-26 MED ORDER — ACETAMINOPHEN 325 MG PO TABS
650.0000 mg | ORAL_TABLET | Freq: Once | ORAL | Status: AC
  Administered 2013-07-26: 650 mg via ORAL

## 2013-07-26 MED ORDER — SODIUM CHLORIDE 0.9 % IV SOLN
Freq: Once | INTRAVENOUS | Status: AC
  Administered 2013-07-26: 11:00:00 via INTRAVENOUS

## 2013-07-26 MED ORDER — DIPHENHYDRAMINE HCL 25 MG PO CAPS
ORAL_CAPSULE | ORAL | Status: AC
  Filled 2013-07-26: qty 1

## 2013-07-26 NOTE — Patient Instructions (Signed)
[facility] Discharge Instructions for Patients Receiving Chemotherapy  Today you received the following chemotherapy agents: rituxan  To help prevent nausea and vomiting after your treatment, we encourage you to take your nausea medication.  Take it as often as prescribed.     If you develop nausea and vomiting that is not controlled by your nausea medication, call the clinic. If it is after clinic hours your family physician or the after hours number for the clinic or go to the Emergency Department.   BELOW ARE SYMPTOMS THAT SHOULD BE REPORTED IMMEDIATELY:  *FEVER GREATER THAN 100.5 F  *CHILLS WITH OR WITHOUT FEVER  NAUSEA AND VOMITING THAT IS NOT CONTROLLED WITH YOUR NAUSEA MEDICATION  *UNUSUAL SHORTNESS OF BREATH  *UNUSUAL BRUISING OR BLEEDING  TENDERNESS IN MOUTH AND THROAT WITH OR WITHOUT PRESENCE OF ULCERS  *URINARY PROBLEMS  *BOWEL PROBLEMS  UNUSUAL RASH Items with * indicate a potential emergency and should be followed up as soon as possible.  Feel free to call the clinic you have any questions or concerns. The clinic phone number is [phone].   I have been informed and understand all the instructions given to me. I know to contact the clinic, my physician, or go to the Emergency Department if any problems should occur. I do not have any questions at this time, but understand that I may call the clinic during office hours   should I have any questions or need assistance in obtaining follow up care.    __________________________________________  _____________  __________ Signature of Patient or Authorized Representative            Date                   Time    __________________________________________ Nurse's Signature    

## 2013-07-29 ENCOUNTER — Telehealth: Payer: Self-pay | Admitting: Oncology

## 2013-07-29 ENCOUNTER — Other Ambulatory Visit: Payer: Self-pay | Admitting: *Deleted

## 2013-07-29 ENCOUNTER — Telehealth: Payer: Self-pay | Admitting: *Deleted

## 2013-07-29 NOTE — Telephone Encounter (Signed)
lvm for pt regarding to 4.3 lab added b4 tx

## 2013-07-29 NOTE — Telephone Encounter (Signed)
Received vm from pt stating that she is trying to reach Dr Beryle Beams b/c she has a touch of bronchitis & thought if she got something soon, we could nip this in the bud.  Will try to route this note to Dr Beryle Beams for advice.

## 2013-07-30 ENCOUNTER — Ambulatory Visit (HOSPITAL_BASED_OUTPATIENT_CLINIC_OR_DEPARTMENT_OTHER): Payer: Medicare Other

## 2013-07-30 VITALS — BP 147/58 | HR 80 | Temp 98.7°F

## 2013-07-30 DIAGNOSIS — Z452 Encounter for adjustment and management of vascular access device: Secondary | ICD-10-CM

## 2013-07-30 DIAGNOSIS — M311 Thrombotic microangiopathy, unspecified: Secondary | ICD-10-CM

## 2013-07-30 DIAGNOSIS — M3119 Other thrombotic microangiopathy: Secondary | ICD-10-CM

## 2013-07-30 MED ORDER — ANTICOAGULANT SODIUM CITRATE 4% (200MG/5ML) IV SOLN
2.4000 mL | Status: DC | PRN
  Filled 2013-07-30: qty 2.4

## 2013-07-30 MED ORDER — SODIUM CHLORIDE 0.9 % IJ SOLN
10.0000 mL | INTRAMUSCULAR | Status: AC | PRN
  Administered 2013-07-30: 10 mL
  Filled 2013-07-30: qty 10

## 2013-07-30 MED ORDER — ANTICOAGULANT SODIUM CITRATE 4% (200MG/5ML) IV SOLN
2.6000 mL | Freq: Once | Status: AC
  Administered 2013-07-30: 2.6 mL via INTRAVENOUS
  Filled 2013-07-30: qty 2.6

## 2013-07-30 MED ORDER — ANTICOAGULANT SODIUM CITRATE 4% (200MG/5ML) IV SOLN
2.4000 mL | Freq: Once | Status: AC
  Administered 2013-07-30: 2.4 mL via INTRAVENOUS
  Filled 2013-07-30: qty 2.4

## 2013-07-30 MED ORDER — SODIUM CHLORIDE 0.9 % IJ SOLN
10.0000 mL | INTRAMUSCULAR | Status: DC | PRN
  Filled 2013-07-30: qty 10

## 2013-07-30 MED ORDER — ANTICOAGULANT SODIUM CITRATE 4% (200MG/5ML) IV SOLN
2.6000 mL | Status: DC | PRN
  Filled 2013-07-30: qty 2.6

## 2013-07-30 NOTE — Progress Notes (Signed)
Will be seen in infusion room per [facility].

## 2013-07-30 NOTE — Progress Notes (Signed)
Pt. arrived for pheresis catheter flush and dressing change today.  Site remains bruised but pt. stated that it is improving. No swelling noted to site with small amount of dried blood noted around catheter entry.  Sutures remain intact. Areas cleansed per policy and sterile dressing reapplied.  Both lumen wasted with 63ml of blood and flushed with saline and sodium citrate per lumen requirement without resistance. Capped with dialysis catheter caps.  Pt. informed me that she will be going to [facility] Medicine for all future pheresis and see Dr. Beryle Beams.

## 2013-07-30 NOTE — Patient Instructions (Signed)
   [facility] Discharge Instructions for Patients Receiving Pheresis catheter flush and central line dressing change.  Today you received the following  agents sodium citrates instilled into pheresis catheters.   If you develop nausea and vomiting that is not controlled by your nausea medication, call the clinic.   BELOW ARE SYMPTOMS THAT SHOULD BE REPORTED IMMEDIATELY:  *FEVER GREATER THAN 100.5 F  *CHILLS WITH OR WITHOUT FEVER  NAUSEA AND VOMITING THAT IS NOT CONTROLLED WITH YOUR NAUSEA MEDICATION  *UNUSUAL SHORTNESS OF BREATH  *UNUSUAL BRUISING OR BLEEDING  TENDERNESS IN MOUTH AND THROAT WITH OR WITHOUT PRESENCE OF ULCERS  *URINARY PROBLEMS  *BOWEL PROBLEMS  UNUSUAL RASH Items with * indicate a potential emergency and should be followed up as soon as possible.  Feel free to call the clinic you have any questions or concerns. The clinic phone number is [phone].

## 2013-08-02 ENCOUNTER — Other Ambulatory Visit: Payer: Self-pay | Admitting: Oncology

## 2013-08-02 ENCOUNTER — Ambulatory Visit (HOSPITAL_BASED_OUTPATIENT_CLINIC_OR_DEPARTMENT_OTHER): Payer: Medicare Other

## 2013-08-02 ENCOUNTER — Ambulatory Visit (HOSPITAL_COMMUNITY)
Admission: AD | Admit: 2013-08-02 | Discharge: 2013-08-02 | Disposition: A | Discharge status: Home / Self Care | Payer: Medicare Other | Source: Ambulatory Visit | Attending: Oncology | Admitting: Oncology

## 2013-08-02 ENCOUNTER — Other Ambulatory Visit (HOSPITAL_BASED_OUTPATIENT_CLINIC_OR_DEPARTMENT_OTHER): Payer: Medicare Other

## 2013-08-02 VITALS — BP 129/51 | HR 79 | Temp 97.8°F | Resp 18

## 2013-08-02 DIAGNOSIS — M311 Thrombotic microangiopathy, unspecified: Secondary | ICD-10-CM | POA: Insufficient documentation

## 2013-08-02 DIAGNOSIS — Z5112 Encounter for antineoplastic immunotherapy: Secondary | ICD-10-CM

## 2013-08-02 DIAGNOSIS — M3119 Other thrombotic microangiopathy: Secondary | ICD-10-CM

## 2013-08-02 DIAGNOSIS — Z862 Personal history of diseases of the blood and blood-forming organs and certain disorders involving the immune mechanism: Secondary | ICD-10-CM

## 2013-08-02 DIAGNOSIS — Z4682 Encounter for fitting and adjustment of non-vascular catheter: Secondary | ICD-10-CM

## 2013-08-02 LAB — COMPREHENSIVE METABOLIC PANEL
ALK PHOS: 75 U/L (ref 39–117)
ALT: 27 U/L (ref 0–35)
AST: 19 U/L (ref 0–37)
Albumin: 3.2 g/dL — ABNORMAL LOW (ref 3.5–5.2)
BILIRUBIN TOTAL: 0.3 mg/dL (ref 0.3–1.2)
BUN: 31 mg/dL — AB (ref 6–23)
CHLORIDE: 102 meq/L (ref 96–112)
CO2: 25 meq/L (ref 19–32)
Calcium: 8.6 mg/dL (ref 8.4–10.5)
Creatinine, Ser: 0.83 mg/dL (ref 0.50–1.10)
GFR, EST AFRICAN AMERICAN: 80 mL/min — AB (ref >90)
GFR, EST NON AFRICAN AMERICAN: 69 mL/min — AB (ref >90)
GLUCOSE: 192 mg/dL — AB (ref 70–99)
Potassium: 4.8 mEq/L (ref 3.7–5.3)
Sodium: 142 mEq/L (ref 137–147)
TOTAL PROTEIN: 5.9 g/dL — AB (ref 6.0–8.3)

## 2013-08-02 LAB — CBC & DIFF AND RETIC
BASO%: 0.1 % (ref 0.0–2.0)
Basophils Absolute: 0 10*3/uL (ref 0.0–0.1)
EOS%: 0.1 % (ref 0.0–7.0)
Eosinophils Absolute: 0 10*3/uL (ref 0.0–0.5)
HEMATOCRIT: 38.8 % (ref 34.8–46.6)
HGB: 12.6 g/dL (ref 11.6–15.9)
IMMATURE RETIC FRACT: 8.2 % (ref 1.60–10.00)
LYMPH%: 2.3 % — ABNORMAL LOW (ref 14.0–49.7)
MCH: 31.6 pg (ref 25.1–34.0)
MCHC: 32.5 g/dL (ref 31.5–36.0)
MCV: 97.2 fL (ref 79.5–101.0)
MONO#: 0.7 10*3/uL (ref 0.1–0.9)
MONO%: 6.3 % (ref 0.0–14.0)
NEUT#: 9.6 10*3/uL — ABNORMAL HIGH (ref 1.5–6.5)
NEUT%: 91.2 % — AB (ref 38.4–76.8)
Platelets: 76 10*3/uL — ABNORMAL LOW (ref 145–400)
RBC: 3.99 10*6/uL (ref 3.70–5.45)
RDW: 14.8 % — ABNORMAL HIGH (ref 11.2–14.5)
Retic %: 3.56 % — ABNORMAL HIGH (ref 0.70–2.10)
Retic Ct Abs: 142.04 10*3/uL — ABNORMAL HIGH (ref 33.70–90.70)
WBC: 10.6 10*3/uL — ABNORMAL HIGH (ref 3.9–10.3)
lymph#: 0.2 10*3/uL — ABNORMAL LOW (ref 0.9–3.3)

## 2013-08-02 LAB — POCT I-STAT, CHEM 8
BUN: 31 mg/dL — AB (ref 6–23)
CHLORIDE: 99 meq/L (ref 96–112)
Calcium, Ion: 1.17 mmol/L (ref 1.13–1.30)
Creatinine, Ser: 1 mg/dL (ref 0.50–1.10)
GLUCOSE: 191 mg/dL — AB (ref 70–99)
HCT: 39 % (ref 36.0–46.0)
Hemoglobin: 13.3 g/dL (ref 12.0–15.0)
Potassium: 4.4 mEq/L (ref 3.7–5.3)
Sodium: 139 mEq/L (ref 137–147)
TCO2: 26 mmol/L (ref 0–100)

## 2013-08-02 LAB — RETICULOCYTES
RBC.: 3.92 MIL/uL (ref 3.87–5.11)
RETIC CT PCT: 3.2 % — AB (ref 0.4–3.1)
Retic Count, Absolute: 125.4 10*3/uL (ref 19.0–186.0)

## 2013-08-02 LAB — LACTATE DEHYDROGENASE: LDH: 394 U/L — ABNORMAL HIGH (ref 94–250)

## 2013-08-02 MED ORDER — SODIUM CHLORIDE 0.9 % IV SOLN
Freq: Once | INTRAVENOUS | Status: AC
  Administered 2013-08-02: 13:00:00 via INTRAVENOUS

## 2013-08-02 MED ORDER — ACD FORMULA A 0.73-2.45-2.2 GM/100ML VI SOLN
Status: AC
  Administered 2013-08-02: 20:00:00
  Filled 2013-08-02: qty 500

## 2013-08-02 MED ORDER — DIPHENHYDRAMINE HCL 25 MG PO CAPS: 25.0000 mg | ORAL_CAPSULE | Freq: Four times a day (QID) | ORAL | Status: DC | PRN

## 2013-08-02 MED ORDER — RITUXIMAB CHEMO INJECTION 10 MG/ML
375.0000 mg/m2 | Freq: Once | INTRAVENOUS | Status: AC
  Administered 2013-08-02: 700 mg via INTRAVENOUS
  Filled 2013-08-02: qty 70

## 2013-08-02 MED ORDER — ACETAMINOPHEN 325 MG PO TABS: 650.0000 mg | ORAL_TABLET | ORAL | Status: DC | PRN

## 2013-08-02 MED ORDER — METHYLPREDNISOLONE SODIUM SUCC 125 MG IJ SOLR: 80.0000 mg | Freq: Once | INTRAMUSCULAR | Status: DC

## 2013-08-02 MED ORDER — ACETAMINOPHEN 325 MG PO TABS
650.0000 mg | ORAL_TABLET | Freq: Once | ORAL | Status: AC
  Administered 2013-08-02: 650 mg via ORAL

## 2013-08-02 MED ORDER — ANTICOAGULANT SODIUM CITRATE 4% (200MG/5ML) IV SOLN
2.4000 mL | Freq: Once | Status: AC
  Administered 2013-08-02: 2.4 mL via INTRAVENOUS
  Filled 2013-08-02: qty 2.4

## 2013-08-02 MED ORDER — CALCIUM GLUCONATE 10 % IV SOLN
4.0000 g | Freq: Once | INTRAVENOUS | Status: AC
  Administered 2013-08-02: 4 g via INTRAVENOUS
  Filled 2013-08-02: qty 40

## 2013-08-02 MED ORDER — DIPHENHYDRAMINE HCL 25 MG PO CAPS
50.0000 mg | ORAL_CAPSULE | Freq: Once | ORAL | Status: AC
  Administered 2013-08-02: 50 mg via ORAL

## 2013-08-02 MED ORDER — DIPHENHYDRAMINE HCL 25 MG PO CAPS
ORAL_CAPSULE | ORAL | Status: AC
  Filled 2013-08-02: qty 2

## 2013-08-02 MED ORDER — CALCIUM CARBONATE ANTACID 500 MG PO CHEW
2.0000 | CHEWABLE_TABLET | ORAL | Status: DC
  Administered 2013-08-02: 400 mg via ORAL

## 2013-08-02 MED ORDER — ANTICOAGULANT SODIUM CITRATE 4% (200MG/5ML) IV SOLN
2.6000 mL | Freq: Once | Status: AC
  Administered 2013-08-02: 2.6 mL via INTRAVENOUS
  Filled 2013-08-02: qty 2.6

## 2013-08-02 MED ORDER — CALCIUM CARBONATE ANTACID 500 MG PO CHEW: 2.0000 | CHEWABLE_TABLET | ORAL | Status: DC

## 2013-08-02 MED ORDER — ANTICOAGULANT SODIUM CITRATE 4% (200MG/5ML) IV SOLN
5.0000 mL | Freq: Once | Status: AC
  Administered 2013-08-02: 5 mL
  Filled 2013-08-02: qty 250

## 2013-08-02 MED ORDER — SODIUM CHLORIDE 0.9 % IJ SOLN
10.0000 mL | Freq: Once | INTRAMUSCULAR | Status: AC
  Administered 2013-08-02: 10 mL
  Filled 2013-08-02: qty 10

## 2013-08-02 MED ORDER — ACETAMINOPHEN 325 MG PO TABS
ORAL_TABLET | ORAL | Status: AC
  Filled 2013-08-02: qty 2

## 2013-08-02 MED ORDER — ACD FORMULA A 0.73-2.45-2.2 GM/100ML VI SOLN: 500.0000 mL | Status: DC

## 2013-08-02 MED ORDER — SODIUM CHLORIDE 0.9 % IV SOLN
4.0000 g | Freq: Once | INTRAVENOUS | Status: DC
  Filled 2013-08-02: qty 40

## 2013-08-02 MED ORDER — CALCIUM CARBONATE ANTACID 500 MG PO CHEW
CHEWABLE_TABLET | ORAL | Status: AC
  Filled 2013-08-02: qty 2

## 2013-08-02 MED ORDER — ACD FORMULA A 0.73-2.45-2.2 GM/100ML VI SOLN
Status: AC
  Filled 2013-08-02: qty 500

## 2013-08-02 NOTE — Progress Notes (Signed)
Hematology and Oncology Follow Up Visit  [name] [id number] [birth year] 73 y.o. 08/02/2013 4:30 PM   Principle Diagnosis: Multiply relapsed TTP   Interim History:  73 year old woman with recent third relapse of TTP suspected when she had a progressive fall in her platelets and elevation of serum LDH  before there were any signs of anemia at time of a 07/05/2013 office visit. I started her on oral steroids and parenteral weekly Rituxan antibody therapy in the hope that we could prevent a full-blown relapse. Unfortunately her counts done one week later she progressive changes and she was admitted to the hospital on March 13. Vascular surgery was consulted and a vascular catheter was placed. She began plasma exchange on the day of admission. She had an initial fall in her platelet count to a nadir of 57,000 on March 14 and a peak LDH value of 345 then a steady improvement with normalization of LDH and rise in the platelet count. After 6 daily inpatient exchanges, she was discharged and received 2 additional exchanges as an outpatient. Platelet count rose up to 187,000 by March 25. She was instructed to continue oral steroids 60 mg of prednisone daily. She continued on a program of weekly Rituxan for a total of 4 doses with the fourth dose schedule for today. She presented to our office for the Rituxan. A CBC was done and now showed a fall in the platelet count down to 76,000. She is not having any bleeding problems, fevers, or neurologic signs or symptoms. I will make arrangements to resume plasma exchange on an outpatient basis today.  She denies any headache, change in vision, slurred speech, focal weakness, no epistaxis, hematuria, hematochezia. She had a recent mild sinus inflammation treated conservatively without antibiotics which has already improved.  Medications: reviewed  Allergies:  Allergies  Allergen Reactions  . Adhesive [Tape]     blisters  . Cefuroxime Axetil    "jittery"  . Other     Wool: Reaction is hives Statistician tape: Reaction is blistering   . Sulfa Antibiotics Hives  . Sulfa Drugs Cross Reactors     Review of Systems: Hematology:  See above ENT ROS: See above Breast ROS:  Respiratory ROS: No cough or dyspnea Cardiovascular ROS:  No ischemic type chest pain Gastrointestinal ROS: No abdominal pain or change in bowel habit    Genito-Urinary ROS: No urinary tract symptoms Musculoskeletal ROS: Chronic arthritis pain in the back Neurological ROS: See above Dermatological ROS: No rash. Localized ecchymoses at sites of vascular catheter. Remaining ROS negative:   Physical Exam: There were no vitals taken for this visit. Wt Readings from Last 3 Encounters:  07/18/13 176 lb 12.9 oz (80.2 kg)  07/17/13 182 lb 5.1 oz (82.7 kg)  07/17/13 182 lb 5.1 oz (82.7 kg)     General appearance: Cushingoid Caucasian woman in no distress HENNT: Pharynx no erythema, exudate, mass, or ulcer. No thyromegaly or thyroid nodules Lymph nodes: No cervical, supraclavicular, or axillary lymphadenopathy Breasts Lungs: Clear to auscultation, resonant to percussion throughout Heart: Regular rhythm, no murmur, no gallop, no rub, no click, no edema Abdomen: Soft, nontender, normal bowel sounds, no mass, no organomegaly Extremities: No edema, no calf tenderness Musculoskeletal: no joint deformities GU:  Vascular: Carotid pulses 2+, no bruits, vascular catheter left subclavian position Neurologic: Alert, oriented, PERRLA,  cranial nerves grossly normal, motor strength 5 over 5, reflexes 2 + symmetric, upper body coordination normal, gait not tested Skin: ecchymosis surrounding the vascular  catheter  Lab Results: CBC W/Diff    Component Value Date/Time   WBC 10.6* 08/02/2013 1008   WBC 9.3 07/22/2013 0800   WBC 8.6 11/09/2007 1100   RBC 3.99 08/02/2013 1008   RBC 3.49* 07/22/2013 0800   RBC 3.49* 07/22/2013 0800   RBC 4.74 11/09/2007 1100   HGB  12.6 08/02/2013 1008   HGB 11.6* 07/22/2013 0826   HGB 14.4 11/09/2007 1100   HCT 38.8 08/02/2013 1008   HCT 34.0* 07/22/2013 0826   HCT 42.6 11/09/2007 1100   PLT 76* 08/02/2013 1008   PLT 150 07/22/2013 0800   PLT 233 11/09/2007 1100   MCV 97.2 08/02/2013 1008   MCV 94.8 07/22/2013 0800   MCV 90 11/09/2007 1100   MCH 31.6 08/02/2013 1008   MCH 31.5 07/22/2013 0800   MCH 30.3 11/09/2007 1100   MCHC 32.5 08/02/2013 1008   MCHC 33.2 07/22/2013 0800   MCHC 33.7 11/09/2007 1100   RDW 14.8* 08/02/2013 1008   RDW 14.5 07/22/2013 0800   RDW 12.5 11/09/2007 1100   LYMPHSABS 0.2* 08/02/2013 1008   LYMPHSABS 1.2 07/22/2013 0800   LYMPHSABS 2.5 11/09/2007 1100   MONOABS 0.7 08/02/2013 1008   MONOABS 0.5 07/22/2013 0800   EOSABS 0.0 08/02/2013 1008   EOSABS 0.0 07/22/2013 0800   EOSABS 0.1 11/09/2007 1100   BASOSABS 0.0 08/02/2013 1008   BASOSABS 0.0 07/22/2013 0800   BASOSABS 0.1 11/09/2007 1100   @LASTCHEMISTRY @  Radiological Studies: Dg Chest Port 1 View  07/12/2013   CLINICAL DATA:  Placement of dialysis catheter.  EXAM: PORTABLE CHEST - 1 VIEW  COMPARISON:  03/2011  FINDINGS: A left subclavian dialysis catheter is present. Catheter tip is near the superior cavoatrial junction. No evidence for a pneumothorax. Lungs are clear without airspace disease or significant edema. Heart size is normal.  IMPRESSION: Left subclavian dialysis catheter. Catheter tip at the cavoatrial junction. Negative for a pneumothorax.   Electronically Signed   By: Markus Daft M.D.   On: 07/12/2013 16:32   Dg Fluoro Guide Cv Line-no Report  07/12/2013   CLINICAL DATA: dialysis catheter   FLOURO GUIDE CV LINE  Fluoroscopy was utilized by the requesting physician.  No radiographic  interpretation.     Impression: Fall in platelet count in a lady with known relapsed TTP just 11 days after discontinuation of plasma exchange and despite ongoing use of steroids and weekly Rituxan. Plan: I will resume plasma exchange with one plasma volume. Weight 80 kg.  Estimated plasma volume 35 mL's per kilogram one plasma exchange equals 2.8 L of fresh frozen plasma. Continue Solu-Medrol 80 mg IV daily with each exchange. I have to assume that she is a Rituxan failure. I am in the process of trying to obtain additional immunosuppressive medication in the form of azathioprine. At this point it is difficult to predict how long we will continue plasma exchange. I will place daily orders for the plasma exchange. I have left my cell phone number with the nurses for any problems. (336 [phone]).    CC: Patient Care Team: Kirk Ruths, MD as PCP - General (Unknown Physician Specialty)   Annia Belt, MD 4/3/20154:30 PM

## 2013-08-02 NOTE — Progress Notes (Signed)
OK to treat patient with Rituxan with platelets of 76K.   VO given and read back from Kennard RN

## 2013-08-02 NOTE — Progress Notes (Signed)
TPE complete without adverse events.  Vital signs stable and pt without complaint.  Pt aware of next appointment on Saturday, April, 04, 2015 @ 1pm.  Pt discharged to home.

## 2013-08-02 NOTE — Patient Instructions (Signed)
[facility] Discharge Instructions for Patients Receiving Chemotherapy  Today you received the following chemotherapy agents :  Rituxan.  To help prevent nausea and vomiting after your treatment, we encourage you to take your nausea medication as instructed by your physician.   If you develop nausea and vomiting that is not controlled by your nausea medication, call the clinic.   BELOW ARE SYMPTOMS THAT SHOULD BE REPORTED IMMEDIATELY:  *FEVER GREATER THAN 100.5 F  *CHILLS WITH OR WITHOUT FEVER  NAUSEA AND VOMITING THAT IS NOT CONTROLLED WITH YOUR NAUSEA MEDICATION  *UNUSUAL SHORTNESS OF BREATH  *UNUSUAL BRUISING OR BLEEDING  TENDERNESS IN MOUTH AND THROAT WITH OR WITHOUT PRESENCE OF ULCERS  *URINARY PROBLEMS  *BOWEL PROBLEMS  UNUSUAL RASH Items with * indicate a potential emergency and should be followed up as soon as possible.  Feel free to call the clinic you have any questions or concerns. The clinic phone number is [phone].

## 2013-08-02 NOTE — Patient Instructions (Signed)
Central Lines A central line is a thin tube (catheter) that is put in your vein to give you medicine or food. It may also be used to take blood for lab tests. There are different types of central lines. The type of central line you receive depends on how long it will be needed, your medical condition, and the condition of your veins.  HOME CARE   Follow your doctor's instructions for the type of central line that you have.  Keep any bandages (dressings) over the central lineclean and dry. Change bandages as told by your doctor. Look for redness or irritation during a bandage change.  Keep the area where the central line goes into your skin clean at all times.   Rinse out (flush) your central line as told by your doctor.   Always wash your hands before changing bandages or rinsing out the central line.  If the central line accidentally gets pulled on, make sure that:  The bandage is okay.  There is no bleeding.  The line has not been pulled out.   Limit lifting, using your arm, or doing other activity as told by your doctor.   Swim or bathe only if your doctor approves. Your doctor can tell you how to keep your specific type of bandage from getting wet.   GET HELP RIGHT AWAY IF:   You have chills.  You have a fever.   You have shortness of breath or trouble breathing.   You have chest pain.   You feel your heart beating fast or "skipping" beats.   You feel dizzy or faint.  You have bleeding that does not stop from the site of the central line.   You have pain, redness, puffiness (swelling), or drainage at the site of the central line.   You have swelling in your neck, face, chest, or arm on the side of your central line.   Your central line is hard to rinse out.  You do not get a blood return from the central line.  You have a hole or tear in your central line.   Your central line leaks when rinsed out or when fluids are put into it. MAKE SURE  YOU:   Understand these instructions.   Will watch your condition.   Will get help right away if you are not doing well or get worse.  Document Released: 04/04/2012 Document Reviewed: 04/04/2012 [facility] Patient Information 2014 [place], [place].

## 2013-08-03 ENCOUNTER — Other Ambulatory Visit: Payer: Self-pay | Admitting: Oncology

## 2013-08-03 ENCOUNTER — Other Ambulatory Visit (HOSPITAL_COMMUNITY): Payer: Self-pay | Admitting: Hematology

## 2013-08-03 ENCOUNTER — Non-Acute Institutional Stay (HOSPITAL_COMMUNITY)
Admission: AD | Admit: 2013-08-03 | Discharge: 2013-08-03 | Disposition: A | Discharge status: Home / Self Care | Payer: Medicare Other | Source: Ambulatory Visit | Attending: Oncology | Admitting: Oncology

## 2013-08-03 DIAGNOSIS — M311 Thrombotic microangiopathy, unspecified: Secondary | ICD-10-CM | POA: Insufficient documentation

## 2013-08-03 DIAGNOSIS — M3119 Other thrombotic microangiopathy: Secondary | ICD-10-CM

## 2013-08-03 LAB — CBC WITH DIFFERENTIAL/PLATELET
Basophils Absolute: 0 10*3/uL (ref 0.0–0.1)
Basophils Relative: 0 % (ref 0–1)
EOS ABS: 0 10*3/uL (ref 0.0–0.7)
Eosinophils Relative: 0 % (ref 0–5)
HCT: 39.5 % (ref 36.0–46.0)
Hemoglobin: 12.9 g/dL (ref 12.0–15.0)
LYMPHS ABS: 0.3 10*3/uL — AB (ref 0.7–4.0)
LYMPHS PCT: 3 % — AB (ref 12–46)
MCH: 32.1 pg (ref 26.0–34.0)
MCHC: 32.7 g/dL (ref 30.0–36.0)
MCV: 98.3 fL (ref 78.0–100.0)
Monocytes Absolute: 0.4 10*3/uL (ref 0.1–1.0)
Monocytes Relative: 4 % (ref 3–12)
NEUTROS ABS: 10.2 10*3/uL — AB (ref 1.7–7.7)
NEUTROS PCT: 93 % — AB (ref 43–77)
Platelets: 76 10*3/uL — ABNORMAL LOW (ref 150–400)
RBC: 4.02 MIL/uL (ref 3.87–5.11)
RDW: 15.1 % (ref 11.5–15.5)
WBC: 10.9 10*3/uL — AB (ref 4.0–10.5)

## 2013-08-03 LAB — COMPREHENSIVE METABOLIC PANEL
ALK PHOS: 81 U/L (ref 39–117)
ALT: 26 U/L (ref 0–35)
AST: 25 U/L (ref 0–37)
Albumin: 3.3 g/dL — ABNORMAL LOW (ref 3.5–5.2)
BUN: 24 mg/dL — ABNORMAL HIGH (ref 6–23)
CO2: 30 mEq/L (ref 19–32)
Calcium: 8.6 mg/dL (ref 8.4–10.5)
Chloride: 100 mEq/L (ref 96–112)
Creatinine, Ser: 1.03 mg/dL (ref 0.50–1.10)
GFR calc non Af Amer: 53 mL/min — ABNORMAL LOW (ref >90)
GFR, EST AFRICAN AMERICAN: 62 mL/min — AB (ref >90)
GLUCOSE: 180 mg/dL — AB (ref 70–99)
POTASSIUM: 4.1 meq/L (ref 3.7–5.3)
SODIUM: 144 meq/L (ref 137–147)
TOTAL PROTEIN: 5.8 g/dL — AB (ref 6.0–8.3)
Total Bilirubin: 0.4 mg/dL (ref 0.3–1.2)

## 2013-08-03 LAB — THERAPEUTIC PLASMA EXCHANGE (BLOOD BANK)
Plasma Exchange: 2806
Plasma volume needed: 2800
UNIT DIVISION: 0
UNIT DIVISION: 0
UNIT DIVISION: 0
UNIT DIVISION: 0
UNIT DIVISION: 0
Unit division: 0
Unit division: 0
Unit division: 0
Unit division: 0
Unit division: 0
Unit division: 0

## 2013-08-03 LAB — RETICULOCYTES
RBC.: 4.02 MIL/uL (ref 3.87–5.11)
Retic Count, Absolute: 132.7 10*3/uL (ref 19.0–186.0)
Retic Ct Pct: 3.3 % — ABNORMAL HIGH (ref 0.4–3.1)

## 2013-08-03 LAB — LACTATE DEHYDROGENASE: LDH: 412 U/L — AB (ref 94–250)

## 2013-08-03 MED ORDER — CALCIUM CARBONATE ANTACID 500 MG PO CHEW
CHEWABLE_TABLET | ORAL | Status: AC
  Administered 2013-08-03: 400 mg via ORAL
  Filled 2013-08-03: qty 4

## 2013-08-03 MED ORDER — DIPHENHYDRAMINE HCL 25 MG PO CAPS
ORAL_CAPSULE | ORAL | Status: AC
  Filled 2013-08-03: qty 1

## 2013-08-03 MED ORDER — ANTICOAGULANT SODIUM CITRATE 4% (200MG/5ML) IV SOLN
5.0000 mL | Freq: Once | Status: AC
  Administered 2013-08-03: 5 mL
  Filled 2013-08-03: qty 250

## 2013-08-03 MED ORDER — DIPHENHYDRAMINE HCL 25 MG PO CAPS
25.0000 mg | ORAL_CAPSULE | Freq: Four times a day (QID) | ORAL | Status: DC | PRN
Start: 2013-08-03 — End: 2013-08-03
  Administered 2013-08-03: 25 mg via ORAL

## 2013-08-03 MED ORDER — ACD FORMULA A 0.73-2.45-2.2 GM/100ML VI SOLN
Status: AC
  Administered 2013-08-03: 500 mL via INTRAVENOUS
  Filled 2013-08-03: qty 500

## 2013-08-03 MED ORDER — CALCIUM CARBONATE ANTACID 500 MG PO CHEW
2.0000 | CHEWABLE_TABLET | ORAL | Status: AC
  Administered 2013-08-03 (×3): 400 mg via ORAL

## 2013-08-03 MED ORDER — METHYLPREDNISOLONE SODIUM SUCC 125 MG IJ SOLR
80.0000 mg | Freq: Once | INTRAMUSCULAR | Status: AC
  Administered 2013-08-03: 80 mg via INTRAVENOUS

## 2013-08-03 MED ORDER — ACETAMINOPHEN 325 MG PO TABS: 650.0000 mg | ORAL_TABLET | ORAL | Status: DC | PRN

## 2013-08-03 MED ORDER — CALCIUM CARBONATE ANTACID 500 MG PO CHEW
CHEWABLE_TABLET | ORAL | Status: AC
  Administered 2013-08-03: 400 mg via ORAL
  Filled 2013-08-03: qty 2

## 2013-08-03 MED ORDER — METHYLPREDNISOLONE SODIUM SUCC 125 MG IJ SOLR
INTRAMUSCULAR | Status: AC
  Administered 2013-08-03: 80 mg via INTRAVENOUS
  Filled 2013-08-03: qty 2

## 2013-08-03 MED ORDER — SODIUM CHLORIDE 0.9 % IV SOLN
4.0000 g | Freq: Once | INTRAVENOUS | Status: AC
  Administered 2013-08-03: 4 g via INTRAVENOUS
  Filled 2013-08-03: qty 40

## 2013-08-03 MED ORDER — ACD FORMULA A 0.73-2.45-2.2 GM/100ML VI SOLN
500.0000 mL | Status: DC
  Administered 2013-08-03 (×2): 500 mL via INTRAVENOUS

## 2013-08-03 NOTE — Progress Notes (Addendum)
TPE completed. Pt experienced mild numbness/tingling in mouth relieved with increase in iv calcium gtt and 2 tums. No further complaints. To be back for next treatment tomorrow at 3pm. Confirmed with pt. Pt states plan is to "re evaluate Monday and will let you know."

## 2013-08-04 ENCOUNTER — Non-Acute Institutional Stay (HOSPITAL_COMMUNITY)
Admission: EM | Admit: 2013-08-04 | Discharge: 2013-08-04 | Disposition: A | Discharge status: Home / Self Care | Payer: Medicare Other | Source: Other Acute Inpatient Hospital | Attending: Oncology | Admitting: Oncology

## 2013-08-04 ENCOUNTER — Other Ambulatory Visit: Payer: Self-pay | Admitting: Nephrology

## 2013-08-04 ENCOUNTER — Other Ambulatory Visit: Payer: Self-pay | Admitting: Oncology

## 2013-08-04 DIAGNOSIS — M3119 Other thrombotic microangiopathy: Secondary | ICD-10-CM

## 2013-08-04 DIAGNOSIS — M311 Thrombotic microangiopathy, unspecified: Secondary | ICD-10-CM | POA: Insufficient documentation

## 2013-08-04 LAB — THERAPEUTIC PLASMA EXCHANGE (BLOOD BANK)
Plasma volume needed: 2600
UNIT DIVISION: 0
UNIT DIVISION: 0
UNIT DIVISION: 0
Unit division: 0
Unit division: 0
Unit division: 0
Unit division: 0
Unit division: 0
Unit division: 0

## 2013-08-04 LAB — CBC WITH DIFFERENTIAL/PLATELET
Basophils Absolute: 0 10*3/uL (ref 0.0–0.1)
Basophils Relative: 0 % (ref 0–1)
Eosinophils Absolute: 0 10*3/uL (ref 0.0–0.7)
Eosinophils Relative: 0 % (ref 0–5)
HCT: 36.7 % (ref 36.0–46.0)
HEMOGLOBIN: 12.1 g/dL (ref 12.0–15.0)
LYMPHS ABS: 0.4 10*3/uL — AB (ref 0.7–4.0)
LYMPHS PCT: 3 % — AB (ref 12–46)
MCH: 32.1 pg (ref 26.0–34.0)
MCHC: 33 g/dL (ref 30.0–36.0)
MCV: 97.3 fL (ref 78.0–100.0)
MONOS PCT: 5 % (ref 3–12)
Monocytes Absolute: 0.7 10*3/uL (ref 0.1–1.0)
Neutro Abs: 11.7 10*3/uL — ABNORMAL HIGH (ref 1.7–7.7)
Neutrophils Relative %: 92 % — ABNORMAL HIGH (ref 43–77)
PLATELETS: 87 10*3/uL — AB (ref 150–400)
RBC: 3.77 MIL/uL — AB (ref 3.87–5.11)
RDW: 15 % (ref 11.5–15.5)
WBC: 12.7 10*3/uL — AB (ref 4.0–10.5)

## 2013-08-04 LAB — COMPREHENSIVE METABOLIC PANEL
ALK PHOS: 76 U/L (ref 39–117)
ALT: 22 U/L (ref 0–35)
AST: 26 U/L (ref 0–37)
Albumin: 3.3 g/dL — ABNORMAL LOW (ref 3.5–5.2)
BILIRUBIN TOTAL: 0.4 mg/dL (ref 0.3–1.2)
BUN: 35 mg/dL — AB (ref 6–23)
CO2: 32 meq/L (ref 19–32)
Calcium: 9.2 mg/dL (ref 8.4–10.5)
Chloride: 97 mEq/L (ref 96–112)
Creatinine, Ser: 1.26 mg/dL — ABNORMAL HIGH (ref 0.50–1.10)
GFR, EST AFRICAN AMERICAN: 48 mL/min — AB (ref >90)
GFR, EST NON AFRICAN AMERICAN: 42 mL/min — AB (ref >90)
GLUCOSE: 138 mg/dL — AB (ref 70–99)
POTASSIUM: 4 meq/L (ref 3.7–5.3)
Sodium: 145 mEq/L (ref 137–147)
Total Protein: 6 g/dL (ref 6.0–8.3)

## 2013-08-04 LAB — RETICULOCYTES
RBC.: 3.77 MIL/uL — ABNORMAL LOW (ref 3.87–5.11)
Retic Count, Absolute: 109.3 10*3/uL (ref 19.0–186.0)
Retic Ct Pct: 2.9 % (ref 0.4–3.1)

## 2013-08-04 LAB — POCT I-STAT, CHEM 8
BUN: 34 mg/dL — ABNORMAL HIGH (ref 6–23)
CHLORIDE: 94 meq/L — AB (ref 96–112)
CREATININE: 1.6 mg/dL — AB (ref 0.50–1.10)
Calcium, Ion: 1.05 mmol/L — ABNORMAL LOW (ref 1.13–1.30)
GLUCOSE: 138 mg/dL — AB (ref 70–99)
HCT: 39 % (ref 36.0–46.0)
Hemoglobin: 13.3 g/dL (ref 12.0–15.0)
Potassium: 3.8 mEq/L (ref 3.7–5.3)
Sodium: 143 mEq/L (ref 137–147)
TCO2: 33 mmol/L (ref 0–100)

## 2013-08-04 LAB — LACTATE DEHYDROGENASE: LDH: 399 U/L — ABNORMAL HIGH (ref 94–250)

## 2013-08-04 MED ORDER — SODIUM CHLORIDE 0.9 % IV SOLN: 80.0000 mg | Freq: Once | INTRAVENOUS | Status: DC

## 2013-08-04 MED ORDER — SODIUM CHLORIDE 0.9 % IV SOLN
4.0000 g | Freq: Once | INTRAVENOUS | Status: AC
  Administered 2013-08-04: 4 g via INTRAVENOUS
  Filled 2013-08-04: qty 40

## 2013-08-04 MED ORDER — METHYLPREDNISOLONE SODIUM SUCC 125 MG IJ SOLR
80.0000 mg | Freq: Once | INTRAMUSCULAR | Status: AC
  Administered 2013-08-04: 80 mg via INTRAVENOUS

## 2013-08-04 MED ORDER — DIPHENHYDRAMINE HCL 25 MG PO CAPS
25.0000 mg | ORAL_CAPSULE | Freq: Four times a day (QID) | ORAL | Status: DC | PRN
  Administered 2013-08-04: 25 mg via ORAL

## 2013-08-04 MED ORDER — CALCIUM CARBONATE ANTACID 500 MG PO CHEW
2.0000 | CHEWABLE_TABLET | ORAL | Status: DC
  Administered 2013-08-04: 400 mg via ORAL

## 2013-08-04 MED ORDER — CALCIUM CARBONATE ANTACID 500 MG PO CHEW
CHEWABLE_TABLET | ORAL | Status: AC
  Administered 2013-08-04: 400 mg
  Filled 2013-08-04: qty 2

## 2013-08-04 MED ORDER — CALCIUM CARBONATE ANTACID 500 MG PO CHEW
CHEWABLE_TABLET | ORAL | Status: AC
  Administered 2013-08-04: 400 mg via ORAL
  Filled 2013-08-04: qty 4

## 2013-08-04 MED ORDER — ANTICOAGULANT SODIUM CITRATE 4% (200MG/5ML) IV SOLN
5.0000 mL | Freq: Once | Status: AC
  Administered 2013-08-04: 5 mL
  Filled 2013-08-04: qty 250

## 2013-08-04 MED ORDER — DIPHENHYDRAMINE HCL 25 MG PO CAPS: 25.0000 mg | ORAL_CAPSULE | Freq: Four times a day (QID) | ORAL | Status: DC | PRN

## 2013-08-04 MED ORDER — ACETAMINOPHEN 325 MG PO TABS: 650.0000 mg | ORAL_TABLET | ORAL | Status: DC | PRN

## 2013-08-04 MED ORDER — METHYLPREDNISOLONE SODIUM SUCC 125 MG IJ SOLR
INTRAMUSCULAR | Status: AC
  Administered 2013-08-04: 80 mg via INTRAVENOUS
  Filled 2013-08-04: qty 2

## 2013-08-04 MED ORDER — CALCIUM CARBONATE ANTACID 500 MG PO CHEW: 2.0000 | CHEWABLE_TABLET | ORAL | Status: AC

## 2013-08-04 MED ORDER — ANTICOAGULANT SODIUM CITRATE 4% (200MG/5ML) IV SOLN: 5.0000 mL | Freq: Once | Status: DC

## 2013-08-04 MED ORDER — DIPHENHYDRAMINE HCL 25 MG PO CAPS
ORAL_CAPSULE | ORAL | Status: AC
  Administered 2013-08-04: 25 mg via ORAL
  Filled 2013-08-04: qty 1

## 2013-08-04 MED ORDER — ACD FORMULA A 0.73-2.45-2.2 GM/100ML VI SOLN: 500.0000 mL | Status: DC

## 2013-08-04 MED ORDER — ACD FORMULA A 0.73-2.45-2.2 GM/100ML VI SOLN
500.0000 mL | Status: DC
  Administered 2013-08-04: 500 mL via INTRAVENOUS

## 2013-08-04 MED ORDER — SODIUM CHLORIDE 0.9 % IV SOLN: 4.0000 g | Freq: Once | INTRAVENOUS | Status: DC

## 2013-08-04 MED ORDER — ACD FORMULA A 0.73-2.45-2.2 GM/100ML VI SOLN
Status: AC
  Administered 2013-08-04: 500 mL via INTRAVENOUS
  Filled 2013-08-04: qty 500

## 2013-08-04 NOTE — Progress Notes (Signed)
TPE completed without further issue. 2600 vol exchange. Pt tolerated well. VS stable at discharge. Pt will follow up with [place] Monday after appointment with Dr. Beryle Beams.

## 2013-08-04 NOTE — Progress Notes (Signed)
TPE- 5 minutes into separation machine not performing adequate separate. Treatment paused and attempted to reset interface. Unable. Will not continue d/t risk of hemolysis. Pt disconnected from machine without rinseback. Comtec set up and treatment restarted at 1636. Continue to monitor patient. Vs stable and pt has no complaints at this time

## 2013-08-05 ENCOUNTER — Encounter: Payer: Self-pay | Admitting: Oncology

## 2013-08-05 ENCOUNTER — Other Ambulatory Visit: Payer: Self-pay | Admitting: Oncology

## 2013-08-05 ENCOUNTER — Ambulatory Visit (INDEPENDENT_AMBULATORY_CARE_PROVIDER_SITE_OTHER): Payer: Medicare Other | Admitting: Oncology

## 2013-08-05 ENCOUNTER — Non-Acute Institutional Stay (HOSPITAL_COMMUNITY)
Admission: AD | Admit: 2013-08-05 | Discharge: 2013-08-05 | Disposition: A | Discharge status: Home / Self Care | Payer: Medicare Other | Source: Ambulatory Visit | Attending: Oncology | Admitting: Oncology

## 2013-08-05 ENCOUNTER — Other Ambulatory Visit: Payer: Self-pay

## 2013-08-05 VITALS — BP 137/65 | HR 96 | Temp 98.4°F | Resp 20 | Ht 63.5 in | Wt 181.6 lb

## 2013-08-05 DIAGNOSIS — M311 Thrombotic microangiopathy, unspecified: Secondary | ICD-10-CM | POA: Insufficient documentation

## 2013-08-05 DIAGNOSIS — B3789 Other sites of candidiasis: Secondary | ICD-10-CM

## 2013-08-05 DIAGNOSIS — M3119 Other thrombotic microangiopathy: Secondary | ICD-10-CM

## 2013-08-05 DIAGNOSIS — J449 Chronic obstructive pulmonary disease, unspecified: Secondary | ICD-10-CM

## 2013-08-05 DIAGNOSIS — F17200 Nicotine dependence, unspecified, uncomplicated: Secondary | ICD-10-CM

## 2013-08-05 DIAGNOSIS — J029 Acute pharyngitis, unspecified: Secondary | ICD-10-CM

## 2013-08-05 LAB — POCT I-STAT, CHEM 8
BUN: 25 mg/dL — ABNORMAL HIGH (ref 6–23)
BUN: 34 mg/dL — AB (ref 6–23)
CALCIUM ION: 1.08 mmol/L — AB (ref 1.13–1.30)
CREATININE: 1.4 mg/dL — AB (ref 0.50–1.10)
Calcium, Ion: 1.01 mmol/L — ABNORMAL LOW (ref 1.13–1.30)
Chloride: 95 mEq/L — ABNORMAL LOW (ref 96–112)
Chloride: 94 mEq/L — ABNORMAL LOW (ref 96–112)
Creatinine, Ser: 1.3 mg/dL — ABNORMAL HIGH (ref 0.50–1.10)
Glucose, Bld: 183 mg/dL — ABNORMAL HIGH (ref 70–99)
Glucose, Bld: 154 mg/dL — ABNORMAL HIGH (ref 70–99)
HCT: 41 % (ref 36.0–46.0)
HCT: 36 % (ref 36.0–46.0)
Hemoglobin: 13.9 g/dL (ref 12.0–15.0)
Hemoglobin: 12.2 g/dL (ref 12.0–15.0)
Potassium: 3.8 mEq/L (ref 3.7–5.3)
Potassium: 3.7 mEq/L (ref 3.7–5.3)
SODIUM: 142 meq/L (ref 137–147)
SODIUM: 141 meq/L (ref 137–147)
TCO2: 31 mmol/L (ref 0–100)
TCO2: 31 mmol/L (ref 0–100)

## 2013-08-05 LAB — CBC WITH DIFFERENTIAL/PLATELET
BASOS ABS: 0 10*3/uL (ref 0.0–0.1)
Basophils Absolute: 0 10*3/uL (ref 0.0–0.1)
Basophils Relative: 0 % (ref 0–1)
Basophils Relative: 0 % (ref 0–1)
EOS ABS: 0 10*3/uL (ref 0.0–0.7)
Eosinophils Absolute: 0 10*3/uL (ref 0.0–0.7)
Eosinophils Relative: 0 % (ref 0–5)
Eosinophils Relative: 0 % (ref 0–5)
HCT: 34.4 % — ABNORMAL LOW (ref 36.0–46.0)
HEMATOCRIT: 35.6 % — AB (ref 36.0–46.0)
HEMOGLOBIN: 11.6 g/dL — AB (ref 12.0–15.0)
HEMOGLOBIN: 11.2 g/dL — AB (ref 12.0–15.0)
Lymphocytes Relative: 7 % — ABNORMAL LOW (ref 12–46)
Lymphocytes Relative: 4 % — ABNORMAL LOW (ref 12–46)
Lymphs Abs: 0.7 10*3/uL (ref 0.7–4.0)
Lymphs Abs: 0.5 10*3/uL — ABNORMAL LOW (ref 0.7–4.0)
MCH: 31.5 pg (ref 26.0–34.0)
MCH: 31.8 pg (ref 26.0–34.0)
MCHC: 32.6 g/dL (ref 30.0–36.0)
MCHC: 32.6 g/dL (ref 30.0–36.0)
MCV: 96.7 fL (ref 78.0–100.0)
MCV: 97.7 fL (ref 78.0–100.0)
MONO ABS: 0.3 10*3/uL (ref 0.1–1.0)
MONOS PCT: 3 % (ref 3–12)
MONOS PCT: 5 % (ref 3–12)
Monocytes Absolute: 0.5 10*3/uL (ref 0.1–1.0)
NEUTROS ABS: 9.4 10*3/uL — AB (ref 1.7–7.7)
Neutro Abs: 10 10*3/uL — ABNORMAL HIGH (ref 1.7–7.7)
Neutrophils Relative %: 90 % — ABNORMAL HIGH (ref 43–77)
Neutrophils Relative %: 91 % — ABNORMAL HIGH (ref 43–77)
Platelets: 100 10*3/uL — ABNORMAL LOW (ref 150–400)
Platelets: 89 10*3/uL — ABNORMAL LOW (ref 150–400)
RBC: 3.68 MIL/uL — ABNORMAL LOW (ref 3.87–5.11)
RBC: 3.52 MIL/uL — ABNORMAL LOW (ref 3.87–5.11)
RDW: 15.4 % (ref 11.5–15.5)
RDW: 15.2 % (ref 11.5–15.5)
WBC: 10.4 10*3/uL (ref 4.0–10.5)
WBC: 10.9 10*3/uL — ABNORMAL HIGH (ref 4.0–10.5)

## 2013-08-05 LAB — LACTATE DEHYDROGENASE
LDH: 306 U/L — AB (ref 94–250)
LDH: 356 U/L — ABNORMAL HIGH (ref 94–250)

## 2013-08-05 LAB — THERAPEUTIC PLASMA EXCHANGE (BLOOD BANK)
Plasma Exchange: 2600
Plasma volume needed: 2600
UNIT DIVISION: 0
UNIT DIVISION: 0
UNIT DIVISION: 0
Unit division: 0
Unit division: 0
Unit division: 0
Unit division: 0
Unit division: 0
Unit division: 0

## 2013-08-05 LAB — COMPLETE METABOLIC PANEL WITH GFR
ALK PHOS: 58 U/L (ref 39–117)
ALT: 21 U/L (ref 0–35)
AST: 20 U/L (ref 0–37)
Albumin: 3.7 g/dL (ref 3.5–5.2)
BUN: 33 mg/dL — ABNORMAL HIGH (ref 6–23)
CO2: 34 mEq/L — ABNORMAL HIGH (ref 19–32)
Calcium: 9.4 mg/dL (ref 8.4–10.5)
Chloride: 99 mEq/L (ref 96–112)
Creat: 1.05 mg/dL (ref 0.50–1.10)
GFR, Est African American: 62 mL/min
GFR, Est Non African American: 54 mL/min — ABNORMAL LOW
Glucose, Bld: 92 mg/dL (ref 70–99)
Potassium: 4.1 mEq/L (ref 3.5–5.3)
SODIUM: 143 meq/L (ref 135–145)
Total Bilirubin: 0.6 mg/dL (ref 0.2–1.2)
Total Protein: 6 g/dL (ref 6.0–8.3)

## 2013-08-05 LAB — RETICULOCYTES
ABS Retic: 103 10*3/uL (ref 19.0–186.0)
RBC.: 3.68 MIL/uL — ABNORMAL LOW (ref 3.87–5.11)
RBC.: 3.52 MIL/uL — AB (ref 3.87–5.11)
Retic Count, Absolute: 112.6 10*3/uL (ref 19.0–186.0)
Retic Ct Pct: 2.8 % — ABNORMAL HIGH (ref 0.4–2.3)
Retic Ct Pct: 3.2 % — ABNORMAL HIGH (ref 0.4–3.1)

## 2013-08-05 LAB — COMPREHENSIVE METABOLIC PANEL
ALBUMIN: 3.4 g/dL — AB (ref 3.5–5.2)
ALT: 22 U/L (ref 0–35)
AST: 24 U/L (ref 0–37)
Alkaline Phosphatase: 66 U/L (ref 39–117)
BUN: 36 mg/dL — AB (ref 6–23)
CHLORIDE: 97 meq/L (ref 96–112)
CO2: 30 mEq/L (ref 19–32)
CREATININE: 1.08 mg/dL (ref 0.50–1.10)
Calcium: 9.3 mg/dL (ref 8.4–10.5)
GFR calc Af Amer: 58 mL/min — ABNORMAL LOW (ref >90)
GFR calc non Af Amer: 50 mL/min — ABNORMAL LOW (ref >90)
Glucose, Bld: 155 mg/dL — ABNORMAL HIGH (ref 70–99)
POTASSIUM: 4 meq/L (ref 3.7–5.3)
Sodium: 145 mEq/L (ref 137–147)
TOTAL PROTEIN: 5.9 g/dL — AB (ref 6.0–8.3)
Total Bilirubin: 0.5 mg/dL (ref 0.3–1.2)

## 2013-08-05 MED ORDER — ACD FORMULA A 0.73-2.45-2.2 GM/100ML VI SOLN: 500.0000 mL | Status: DC

## 2013-08-05 MED ORDER — CALCIUM GLUCONATE 10 % IV SOLN: 4.0000 g | Freq: Once | INTRAVENOUS | Status: DC

## 2013-08-05 MED ORDER — METHYLPREDNISOLONE SODIUM SUCC 125 MG IJ SOLR
INTRAMUSCULAR | Status: AC
  Filled 2013-08-05: qty 2

## 2013-08-05 MED ORDER — ACD FORMULA A 0.73-2.45-2.2 GM/100ML VI SOLN
Status: AC
  Filled 2013-08-05: qty 500

## 2013-08-05 MED ORDER — CALCIUM CARBONATE ANTACID 500 MG PO CHEW
CHEWABLE_TABLET | ORAL | Status: AC
  Filled 2013-08-05: qty 2

## 2013-08-05 MED ORDER — ANTICOAGULANT SODIUM CITRATE 4% (200MG/5ML) IV SOLN
5.0000 mL | Freq: Once | Status: AC
  Administered 2013-08-05: 5 mL
  Filled 2013-08-05 (×2): qty 250

## 2013-08-05 MED ORDER — DIPHENHYDRAMINE HCL 25 MG PO CAPS
ORAL_CAPSULE | ORAL | Status: AC
  Filled 2013-08-05: qty 1

## 2013-08-05 MED ORDER — METHYLPREDNISOLONE SODIUM SUCC 125 MG IJ SOLR
80.0000 mg | Freq: Once | INTRAMUSCULAR | Status: AC
  Administered 2013-08-05: 80 mg via INTRAVENOUS

## 2013-08-05 MED ORDER — CALCIUM CARBONATE ANTACID 500 MG PO CHEW
2.0000 | CHEWABLE_TABLET | ORAL | Status: AC
  Administered 2013-08-05: 400 mg via ORAL

## 2013-08-05 MED ORDER — METHYLPREDNISOLONE SODIUM SUCC 125 MG IJ SOLR: 80.0000 mg | Freq: Once | INTRAMUSCULAR | Status: DC

## 2013-08-05 MED ORDER — ACETAMINOPHEN 325 MG PO TABS: 650.0000 mg | ORAL_TABLET | ORAL | Status: DC | PRN

## 2013-08-05 MED ORDER — CALCIUM CARBONATE ANTACID 500 MG PO CHEW: 2.0000 | CHEWABLE_TABLET | ORAL | Status: DC

## 2013-08-05 MED ORDER — ANTICOAGULANT SODIUM CITRATE 4% (200MG/5ML) IV SOLN: 5.0000 mL | Freq: Once | Status: DC

## 2013-08-05 MED ORDER — ACD FORMULA A 0.73-2.45-2.2 GM/100ML VI SOLN
Status: AC
  Administered 2013-08-05: 17:00:00
  Filled 2013-08-05: qty 500

## 2013-08-05 MED ORDER — DIPHENHYDRAMINE HCL 25 MG PO CAPS
25.0000 mg | ORAL_CAPSULE | Freq: Four times a day (QID) | ORAL | Status: DC | PRN
  Administered 2013-08-05: 25 mg via ORAL

## 2013-08-05 MED ORDER — SODIUM CHLORIDE 0.9 % IV SOLN
4.0000 g | Freq: Once | INTRAVENOUS | Status: DC
  Filled 2013-08-05 (×3): qty 40

## 2013-08-05 MED ORDER — DIPHENHYDRAMINE HCL 25 MG PO CAPS: 25.0000 mg | ORAL_CAPSULE | Freq: Four times a day (QID) | ORAL | Status: DC | PRN

## 2013-08-05 NOTE — Progress Notes (Signed)
Patient ID: [name], female   DOB: Jun 29, 1940, 73 y.o.   MRN: [id number] Hematology and Oncology Follow Up Visit  [name] [id number] Oct 16, 1940 73 y.o. 08/05/2013 2:03 PM   Principle Diagnosis: Encounter Diagnosis  Name Primary?  . TTP (thrombotic thrombocytopenic purpura) Yes     Interim History:   Short interval followup visit for this 73 year old woman in her third relapse of TTP. She received a total of 8 plasma exchanges along with daily steroids beginning at time of most recent relapse on 07/12/2013. She is now received 4 doses of Rituxan. She started Imuran 100 mg daily 3 days ago. Unfortunately,off  plasma exchange for just one week, platelet count began to fall and LDH began to rise and plasma exchange was reinstituted as an outpatient on Friday, April 3. Catheter is functioning well. Platelet count Friday 76,000 down from 157,000 on March 27. LDH up to 412 from 301. No new signs or symptoms. Complete exam done at the dialysis center on Friday unchanged from previous. No problems over the weekend. Platelet count starting to come up again and was 87,000 on April 5. LDH trending down at 399.  She had a nice Easter weekend. Her first grandchild was baptized yesterday.  Medications: reviewed  Allergies:  Allergies  Allergen Reactions  . Cefuroxime Axetil Other (See Comments)    "jittery"  . Adhesive [Tape] Other (See Comments)    blisters  . Other Other (See Comments)    Wool: Reaction is hives [employer] tape: Reaction is blistering   . Sulfa Antibiotics Hives  . Sulfa Drugs Cross Reactors Other (See Comments)    unknown    Review of Systems: Hematology: No bleeding or bruising  ENT ROS: No sore throat Breast ROS:  Respiratory ROS: No cough or dyspnea Cardiovascular ROS:  No chest pain or palpitations Gastrointestinal ROS: No abdominal pain   Genito-Urinary ROS: No urinary tract symptoms  Musculoskeletal ROS: No change in chronic  arthritis pain in her back Neurological ROS: No headache or change in vision, no focal weakness or paresthesias.  Dermatological ROS: No rash Remaining ROS negative:   Physical Exam: Blood pressure 137/65, pulse 96, temperature 98.4 F (36.9 C), temperature source Oral, resp. rate 20, height 5' 3.5" (1.613 m), weight 181 lb 9.6 oz (82.373 kg), SpO2 98.00%. Wt Readings from Last 3 Encounters:  08/05/13 181 lb 9.6 oz (82.373 kg)  08/02/13 179 lb 0.2 oz (81.2 kg)  07/18/13 176 lb 12.9 oz (80.2 kg)     General appearance: Well-nourished Caucasian woman. Cushingoid from steroids. HENNT: Pharynx no erythema, there is a patchy white exudate right posterior pharynx consistent with candida infection.. No thyromegaly or thyroid nodules Lymph nodes: No cervical, supraclavicular, or axillary lymphadenopathy Breasts: Lungs: Clear to auscultation, resonant to percussion throughout. Scattered wheezes Heart: Regular rhythm, no murmur, no gallop, no rub, no click, no edema Abdomen: Soft, nontender, normal bowel sounds, no mass, no organomegaly Extremities: No edema, no calf tenderness Musculoskeletal: no joint deformities GU:  Vascular: Carotid pulses 2+, no bruits, distal pulses: Vascular catheter left subclavian position. No tenderness erythema or exudate at entry site. Neurologic: Alert, oriented, PERRLA, optic discs not well visualized due to faulty equipment. no gross hemorrhage or exudate, cranial nerves grossly normal, motor strength 5 over 5, reflexes 2+ symmetric, upper body coordination normal, gait normal, Skin: No rash or ecchymosis  Lab Results: CBC W/Diff    Component Value Date/Time   WBC 12.7* 08/04/2013 1503   WBC 10.6*  08/02/2013 1008   WBC 8.6 11/09/2007 1100   RBC 3.77* 08/04/2013 1503   RBC 3.77* 08/04/2013 1503   RBC 3.99 08/02/2013 1008   RBC 4.74 11/09/2007 1100   HGB 13.3 08/04/2013 1539   HGB 12.6 08/02/2013 1008   HGB 14.4 11/09/2007 1100   HCT 39.0 08/04/2013 1539   HCT 38.8  08/02/2013 1008   HCT 42.6 11/09/2007 1100   PLT 87* 08/04/2013 1503   PLT 76* 08/02/2013 1008   PLT 233 11/09/2007 1100   MCV 97.3 08/04/2013 1503   MCV 97.2 08/02/2013 1008   MCV 90 11/09/2007 1100   MCH 32.1 08/04/2013 1503   MCH 31.6 08/02/2013 1008   MCH 30.3 11/09/2007 1100   MCHC 33.0 08/04/2013 1503   MCHC 32.5 08/02/2013 1008   MCHC 33.7 11/09/2007 1100   RDW 15.0 08/04/2013 1503   RDW 14.8* 08/02/2013 1008   RDW 12.5 11/09/2007 1100   LYMPHSABS 0.4* 08/04/2013 1503   LYMPHSABS 0.2* 08/02/2013 1008   LYMPHSABS 2.5 11/09/2007 1100   MONOABS 0.7 08/04/2013 1503   MONOABS 0.7 08/02/2013 1008   EOSABS 0.0 08/04/2013 1503   EOSABS 0.0 08/02/2013 1008   EOSABS 0.1 11/09/2007 1100   BASOSABS 0.0 08/04/2013 1503   BASOSABS 0.0 08/02/2013 1008   BASOSABS 0.1 11/09/2007 1100     Chemistry      Component Value Date/Time   NA 143 08/04/2013 1539   NA 140 07/26/2013 1020   K 3.8 08/04/2013 1539   K 4.6 07/26/2013 1020   CL 94* 08/04/2013 1539   CL 107 05/22/2012 1023   CO2 32 08/04/2013 1503   CO2 22 07/26/2013 1020   BUN 34* 08/04/2013 1539   BUN 35.1* 07/26/2013 1020   CREATININE 1.60* 08/04/2013 1539   CREATININE 0.9 07/26/2013 1020   CREATININE 1.52* 05/05/2011 1117      Component Value Date/Time   CALCIUM 9.2 08/04/2013 1503   CALCIUM 8.7 07/26/2013 1020   ALKPHOS 76 08/04/2013 1503   ALKPHOS 67 07/26/2013 1020   AST 26 08/04/2013 1503   AST 15 07/26/2013 1020   ALT 22 08/04/2013 1503   ALT 27 07/26/2013 1020   BILITOT 0.4 08/04/2013 1503   BILITOT 0.40 07/26/2013 1020       Radiological Studies: Dg Chest Port 1 View  07/12/2013   CLINICAL DATA:  Placement of dialysis catheter.  EXAM: PORTABLE CHEST - 1 VIEW  COMPARISON:  03/2011  FINDINGS: A left subclavian dialysis catheter is present. Catheter tip is near the superior cavoatrial junction. No evidence for a pneumothorax. Lungs are clear without airspace disease or significant edema. Heart size is normal.  IMPRESSION: Left subclavian dialysis catheter. Catheter tip at the cavoatrial  junction. Negative for a pneumothorax.   Electronically Signed   By: Markus Daft M.D.   On: 07/12/2013 16:32   Dg Fluoro Guide Cv Line-no Report  07/12/2013   CLINICAL DATA: dialysis catheter   FLOURO GUIDE CV LINE  Fluoroscopy was utilized by the requesting physician.  No radiographic  interpretation.     Impression:  #1. Multiply relapsed TTP. Although she had an  initial good response to plasma exchange, counts deteriorated just one week off treatment. It will be very difficult if not impossible to predict the number of exchanges necessary to keep her in remission. We may still see some response from the addition of the Rituxan. I have added Imuran to see if we can maintain her remission status. For now, we will need  to continue daily exchanges until she achieves a complete response and then change her to a maintenance schedule. Unfortunately, the  longer the catheter remains in place the higher the chance it will become infected.  #2. Candida pharyngitis from steroid use Plan prescribe Diflucan 200 mg today then 100 mg daily.  #3. COPD  #4. Tobacco addiction with no incentive to stop smoking  #5. History of treated depression.   CC: Patient Care Team: Kirk Ruths, MD as PCP - General (Unknown Physician Specialty)   Annia Belt, MD 4/6/20152:03 PM

## 2013-08-05 NOTE — Patient Instructions (Signed)
Continue daily plasma exchange Start Diflucan 100 mg 2 tabs today then 1 tab daily for yeast infection Follow up visit with Dr Darnell Level Monday 4/13@ 8:30 AM for 30 minutes

## 2013-08-06 ENCOUNTER — Other Ambulatory Visit: Payer: Medicare Other

## 2013-08-06 ENCOUNTER — Other Ambulatory Visit: Payer: Self-pay | Admitting: Oncology

## 2013-08-06 ENCOUNTER — Non-Acute Institutional Stay (HOSPITAL_COMMUNITY)
Admission: AD | Admit: 2013-08-06 | Discharge: 2013-08-06 | Disposition: A | Discharge status: Home / Self Care | Payer: Medicare Other | Source: Ambulatory Visit | Attending: Oncology | Admitting: Oncology

## 2013-08-06 DIAGNOSIS — M3119 Other thrombotic microangiopathy: Secondary | ICD-10-CM

## 2013-08-06 DIAGNOSIS — M311 Thrombotic microangiopathy, unspecified: Secondary | ICD-10-CM | POA: Insufficient documentation

## 2013-08-06 LAB — THERAPEUTIC PLASMA EXCHANGE (BLOOD BANK)
Plasma volume needed: 2600
UNIT DIVISION: 0
UNIT DIVISION: 0
UNIT DIVISION: 0
UNIT DIVISION: 0
UNIT DIVISION: 0
UNIT DIVISION: 0
Unit division: 0
Unit division: 0
Unit division: 0
Unit division: 0

## 2013-08-06 LAB — CBC
HEMATOCRIT: 31.7 % — AB (ref 36.0–46.0)
HEMOGLOBIN: 10.7 g/dL — AB (ref 12.0–15.0)
MCH: 32.5 pg (ref 26.0–34.0)
MCHC: 33.8 g/dL (ref 30.0–36.0)
MCV: 96.4 fL (ref 78.0–100.0)
PLATELETS: 87 10*3/uL — AB (ref 150–400)
RBC: 3.29 MIL/uL — AB (ref 3.87–5.11)
RDW: 14.7 % (ref 11.5–15.5)
WBC: 9.6 10*3/uL (ref 4.0–10.5)

## 2013-08-06 LAB — RETICULOCYTES
RBC.: 3.29 MIL/uL — ABNORMAL LOW (ref 3.87–5.11)
RETIC COUNT ABSOLUTE: 111.9 10*3/uL (ref 19.0–186.0)
Retic Ct Pct: 3.4 % — ABNORMAL HIGH (ref 0.4–3.1)

## 2013-08-06 MED ORDER — ACD FORMULA A 0.73-2.45-2.2 GM/100ML VI SOLN
500.0000 mL | Status: DC
  Administered 2013-08-06: 500 mL via INTRAVENOUS

## 2013-08-06 MED ORDER — DIPHENHYDRAMINE HCL 25 MG PO CAPS
25.0000 mg | ORAL_CAPSULE | Freq: Four times a day (QID) | ORAL | Status: DC | PRN
  Administered 2013-08-06: 25 mg via ORAL

## 2013-08-06 MED ORDER — CALCIUM CARBONATE ANTACID 500 MG PO CHEW
CHEWABLE_TABLET | ORAL | Status: AC
  Filled 2013-08-06: qty 2

## 2013-08-06 MED ORDER — DIPHENHYDRAMINE HCL 25 MG PO CAPS
ORAL_CAPSULE | ORAL | Status: AC
  Filled 2013-08-06: qty 1

## 2013-08-06 MED ORDER — ACETAMINOPHEN 325 MG PO TABS: 650.0000 mg | ORAL_TABLET | ORAL | Status: DC | PRN

## 2013-08-06 MED ORDER — CALCIUM CARBONATE ANTACID 500 MG PO CHEW
2.0000 | CHEWABLE_TABLET | ORAL | Status: DC
  Administered 2013-08-06: 400 mg via ORAL

## 2013-08-06 MED ORDER — METHYLPREDNISOLONE SODIUM SUCC 125 MG IJ SOLR
80.0000 mg | Freq: Once | INTRAMUSCULAR | Status: AC
  Administered 2013-08-06: 80 mg via INTRAVENOUS

## 2013-08-06 MED ORDER — METHYLPREDNISOLONE SODIUM SUCC 125 MG IJ SOLR
INTRAMUSCULAR | Status: AC
  Filled 2013-08-06: qty 2

## 2013-08-06 MED ORDER — CALCIUM GLUCONATE 10 % IV SOLN
4.0000 g | Freq: Once | INTRAVENOUS | Status: AC
  Administered 2013-08-06: 4 g via INTRAVENOUS
  Filled 2013-08-06: qty 40

## 2013-08-06 MED ORDER — ACD FORMULA A 0.73-2.45-2.2 GM/100ML VI SOLN
Status: AC
  Filled 2013-08-06: qty 500

## 2013-08-06 MED ORDER — ANTICOAGULANT SODIUM CITRATE 4% (200MG/5ML) IV SOLN
5.0000 mL | Freq: Once | Status: AC
  Administered 2013-08-06: 5 mL
  Filled 2013-08-06: qty 250

## 2013-08-06 NOTE — Progress Notes (Signed)
TPE complete without adverse events.  Vital signs stable and pt without compliant.  Pt being discharged to home and aware of next appointment on Wednesday, August 07, 2013.

## 2013-08-07 ENCOUNTER — Non-Acute Institutional Stay (HOSPITAL_COMMUNITY)
Admission: AD | Admit: 2013-08-07 | Discharge: 2013-08-07 | Disposition: A | Discharge status: Home / Self Care | Payer: Medicare Other | Source: Ambulatory Visit | Attending: Oncology | Admitting: Oncology

## 2013-08-07 ENCOUNTER — Other Ambulatory Visit: Payer: Self-pay | Admitting: Oncology

## 2013-08-07 ENCOUNTER — Telehealth: Payer: Self-pay | Admitting: *Deleted

## 2013-08-07 DIAGNOSIS — M311 Thrombotic microangiopathy, unspecified: Secondary | ICD-10-CM | POA: Insufficient documentation

## 2013-08-07 DIAGNOSIS — M3119 Other thrombotic microangiopathy: Secondary | ICD-10-CM

## 2013-08-07 LAB — COMPREHENSIVE METABOLIC PANEL
ALBUMIN: 3.4 g/dL — AB (ref 3.5–5.2)
ALT: 23 U/L (ref 0–35)
AST: 26 U/L (ref 0–37)
Alkaline Phosphatase: 71 U/L (ref 39–117)
BUN: 36 mg/dL — ABNORMAL HIGH (ref 6–23)
CO2: 33 mEq/L — ABNORMAL HIGH (ref 19–32)
Calcium: 9.5 mg/dL (ref 8.4–10.5)
Chloride: 98 mEq/L (ref 96–112)
Creatinine, Ser: 1.18 mg/dL — ABNORMAL HIGH (ref 0.50–1.10)
GFR calc Af Amer: 52 mL/min — ABNORMAL LOW (ref >90)
GFR calc non Af Amer: 45 mL/min — ABNORMAL LOW (ref >90)
GLUCOSE: 133 mg/dL — AB (ref 70–99)
Potassium: 3.3 mEq/L — ABNORMAL LOW (ref 3.7–5.3)
SODIUM: 144 meq/L (ref 137–147)
TOTAL PROTEIN: 6.1 g/dL (ref 6.0–8.3)
Total Bilirubin: 0.4 mg/dL (ref 0.3–1.2)

## 2013-08-07 LAB — THERAPEUTIC PLASMA EXCHANGE (BLOOD BANK)
Plasma volume needed: 2600
UNIT DIVISION: 0
UNIT DIVISION: 0
UNIT DIVISION: 0
UNIT DIVISION: 0
UNIT DIVISION: 0
Unit division: 0
Unit division: 0
Unit division: 0
Unit division: 0

## 2013-08-07 LAB — CBC WITH DIFFERENTIAL/PLATELET
BASOS ABS: 0 10*3/uL (ref 0.0–0.1)
BASOS PCT: 0 % (ref 0–1)
EOS ABS: 0 10*3/uL (ref 0.0–0.7)
Eosinophils Relative: 0 % (ref 0–5)
HCT: 33.4 % — ABNORMAL LOW (ref 36.0–46.0)
Hemoglobin: 10.8 g/dL — ABNORMAL LOW (ref 12.0–15.0)
LYMPHS ABS: 0.7 10*3/uL (ref 0.7–4.0)
Lymphocytes Relative: 5 % — ABNORMAL LOW (ref 12–46)
MCH: 31.7 pg (ref 26.0–34.0)
MCHC: 32.3 g/dL (ref 30.0–36.0)
MCV: 97.9 fL (ref 78.0–100.0)
Monocytes Absolute: 1.1 10*3/uL — ABNORMAL HIGH (ref 0.1–1.0)
Monocytes Relative: 9 % (ref 3–12)
NEUTROS PCT: 85 % — AB (ref 43–77)
Neutro Abs: 10.3 10*3/uL — ABNORMAL HIGH (ref 1.7–7.7)
Platelets: 98 10*3/uL — ABNORMAL LOW (ref 150–400)
RBC: 3.41 MIL/uL — AB (ref 3.87–5.11)
RDW: 14.9 % (ref 11.5–15.5)
WBC: 12.1 10*3/uL — ABNORMAL HIGH (ref 4.0–10.5)

## 2013-08-07 LAB — POCT I-STAT, CHEM 8
BUN: 33 mg/dL — AB (ref 6–23)
CALCIUM ION: 1.18 mmol/L (ref 1.13–1.30)
Chloride: 95 mEq/L — ABNORMAL LOW (ref 96–112)
Creatinine, Ser: 1.5 mg/dL — ABNORMAL HIGH (ref 0.50–1.10)
Glucose, Bld: 132 mg/dL — ABNORMAL HIGH (ref 70–99)
HEMATOCRIT: 34 % — AB (ref 36.0–46.0)
Hemoglobin: 11.6 g/dL — ABNORMAL LOW (ref 12.0–15.0)
POTASSIUM: 3.1 meq/L — AB (ref 3.7–5.3)
SODIUM: 143 meq/L (ref 137–147)
TCO2: 34 mmol/L (ref 0–100)

## 2013-08-07 LAB — LACTATE DEHYDROGENASE: LDH: 343 U/L — AB (ref 94–250)

## 2013-08-07 LAB — RETICULOCYTES
RBC.: 3.41 MIL/uL — ABNORMAL LOW (ref 3.87–5.11)
RETIC COUNT ABSOLUTE: 119.4 10*3/uL (ref 19.0–186.0)
Retic Ct Pct: 3.5 % — ABNORMAL HIGH (ref 0.4–3.1)

## 2013-08-07 MED ORDER — SODIUM CHLORIDE 0.9 % IV SOLN
4.0000 g | Freq: Once | INTRAVENOUS | Status: AC
  Administered 2013-08-07: 4 g via INTRAVENOUS
  Filled 2013-08-07: qty 40

## 2013-08-07 MED ORDER — ACD FORMULA A 0.73-2.45-2.2 GM/100ML VI SOLN
Status: AC
Start: 2013-08-07 — End: 2013-08-07
  Filled 2013-08-07: qty 500

## 2013-08-07 MED ORDER — CALCIUM CARBONATE ANTACID 500 MG PO CHEW
2.0000 | CHEWABLE_TABLET | ORAL | Status: DC
  Administered 2013-08-07: 400 mg via ORAL

## 2013-08-07 MED ORDER — METHYLPREDNISOLONE SODIUM SUCC 125 MG IJ SOLR
60.0000 mg | Freq: Once | INTRAMUSCULAR | Status: AC
  Administered 2013-08-07: 60 mg via INTRAVENOUS

## 2013-08-07 MED ORDER — METHYLPREDNISOLONE SODIUM SUCC 125 MG IJ SOLR
INTRAMUSCULAR | Status: AC
  Filled 2013-08-07: qty 2

## 2013-08-07 MED ORDER — ACETAMINOPHEN 325 MG PO TABS: 650.0000 mg | ORAL_TABLET | ORAL | Status: DC | PRN

## 2013-08-07 MED ORDER — DIPHENHYDRAMINE HCL 25 MG PO CAPS: 25.0000 mg | ORAL_CAPSULE | Freq: Four times a day (QID) | ORAL | Status: DC | PRN

## 2013-08-07 MED ORDER — DIPHENHYDRAMINE HCL 25 MG PO CAPS
ORAL_CAPSULE | ORAL | Status: AC
  Administered 2013-08-07: 25 mg
  Filled 2013-08-07: qty 1

## 2013-08-07 MED ORDER — SODIUM CHLORIDE 0.9 % IV SOLN: 4.0000 g | Freq: Once | INTRAVENOUS | Status: DC

## 2013-08-07 MED ORDER — CALCIUM CARBONATE ANTACID 500 MG PO CHEW: 2.0000 | CHEWABLE_TABLET | ORAL | Status: DC

## 2013-08-07 MED ORDER — ACD FORMULA A 0.73-2.45-2.2 GM/100ML VI SOLN
500.0000 mL | Status: DC
  Administered 2013-08-07: 500 mL via INTRAVENOUS

## 2013-08-07 MED ORDER — ANTICOAGULANT SODIUM CITRATE 4% (200MG/5ML) IV SOLN
5.0000 mL | Freq: Once | Status: AC
Start: 2013-08-07 — End: 2013-08-07
  Administered 2013-08-07: 5 mL
  Filled 2013-08-07: qty 250

## 2013-08-07 MED ORDER — ANTICOAGULANT SODIUM CITRATE 4% (200MG/5ML) IV SOLN: 5.0000 mL | Freq: Once | Status: DC

## 2013-08-07 MED ORDER — ACD FORMULA A 0.73-2.45-2.2 GM/100ML VI SOLN: 500.0000 mL | Status: DC

## 2013-08-07 MED ORDER — CALCIUM CARBONATE ANTACID 500 MG PO CHEW
CHEWABLE_TABLET | ORAL | Status: AC
  Filled 2013-08-07: qty 2

## 2013-08-07 NOTE — Progress Notes (Signed)
TPE complete without adverse events.  Vital signs stable and pt without complaint.  Pt aware of next treatment on Thursday, August 08, 2013.  Pt discharged to home.

## 2013-08-07 NOTE — Telephone Encounter (Signed)
Call to pt; pt's husband stated she was taking a nap and he would receive the results for her. Pt's husband notified that pt's platelet count on 08/05/13 was 100,000. He verbalized understanding of this result, stating "that's what we want to hear." Yvonna Alanis, RN, 08/07/13,5:20P

## 2013-08-08 ENCOUNTER — Encounter (HOSPITAL_COMMUNITY): Payer: Self-pay | Admitting: Oncology

## 2013-08-08 ENCOUNTER — Other Ambulatory Visit: Payer: Self-pay | Admitting: Oncology

## 2013-08-08 ENCOUNTER — Non-Acute Institutional Stay (HOSPITAL_COMMUNITY)
Admission: AD | Admit: 2013-08-08 | Discharge: 2013-08-08 | Disposition: A | Discharge status: Home / Self Care | Payer: Medicare Other | Source: Ambulatory Visit | Attending: Oncology | Admitting: Oncology

## 2013-08-08 DIAGNOSIS — M311 Thrombotic microangiopathy, unspecified: Secondary | ICD-10-CM

## 2013-08-08 DIAGNOSIS — M3119 Other thrombotic microangiopathy: Secondary | ICD-10-CM

## 2013-08-08 DIAGNOSIS — B370 Candidal stomatitis: Secondary | ICD-10-CM

## 2013-08-08 DIAGNOSIS — E876 Hypokalemia: Secondary | ICD-10-CM

## 2013-08-08 DIAGNOSIS — Z9289 Personal history of other medical treatment: Secondary | ICD-10-CM

## 2013-08-08 HISTORY — DX: Candidal stomatitis: B37.0

## 2013-08-08 HISTORY — DX: Personal history of other medical treatment: Z92.89

## 2013-08-08 HISTORY — DX: Hypokalemia: E87.6

## 2013-08-08 LAB — COMPREHENSIVE METABOLIC PANEL
ALBUMIN: 3.3 g/dL — AB (ref 3.5–5.2)
ALT: 19 U/L (ref 0–35)
AST: 21 U/L (ref 0–37)
Alkaline Phosphatase: 58 U/L (ref 39–117)
BUN: 31 mg/dL — ABNORMAL HIGH (ref 6–23)
CALCIUM: 9.1 mg/dL (ref 8.4–10.5)
CO2: 30 mEq/L (ref 19–32)
Chloride: 97 mEq/L (ref 96–112)
Creatinine, Ser: 0.95 mg/dL (ref 0.50–1.10)
GFR calc non Af Amer: 59 mL/min — ABNORMAL LOW (ref >90)
GFR, EST AFRICAN AMERICAN: 68 mL/min — AB (ref >90)
Glucose, Bld: 118 mg/dL — ABNORMAL HIGH (ref 70–99)
Potassium: 3.6 mEq/L — ABNORMAL LOW (ref 3.7–5.3)
SODIUM: 143 meq/L (ref 137–147)
TOTAL PROTEIN: 5.8 g/dL — AB (ref 6.0–8.3)
Total Bilirubin: 0.4 mg/dL (ref 0.3–1.2)

## 2013-08-08 LAB — THERAPEUTIC PLASMA EXCHANGE (BLOOD BANK)
Plasma volume needed: 2600
UNIT DIVISION: 0
UNIT DIVISION: 0
UNIT DIVISION: 0
UNIT DIVISION: 0
UNIT DIVISION: 0
Unit division: 0
Unit division: 0
Unit division: 0
Unit division: 0
Unit division: 0

## 2013-08-08 LAB — CBC WITH DIFFERENTIAL/PLATELET
BASOS ABS: 0 10*3/uL (ref 0.0–0.1)
BASOS PCT: 0 % (ref 0–1)
EOS ABS: 0 10*3/uL (ref 0.0–0.7)
EOS PCT: 0 % (ref 0–5)
HCT: 29.8 % — ABNORMAL LOW (ref 36.0–46.0)
Hemoglobin: 9.8 g/dL — ABNORMAL LOW (ref 12.0–15.0)
LYMPHS ABS: 0.4 10*3/uL — AB (ref 0.7–4.0)
Lymphocytes Relative: 3 % — ABNORMAL LOW (ref 12–46)
MCH: 32.1 pg (ref 26.0–34.0)
MCHC: 32.9 g/dL (ref 30.0–36.0)
MCV: 97.7 fL (ref 78.0–100.0)
Monocytes Absolute: 0.9 10*3/uL (ref 0.1–1.0)
Monocytes Relative: 8 % (ref 3–12)
Neutro Abs: 9.4 10*3/uL — ABNORMAL HIGH (ref 1.7–7.7)
Neutrophils Relative %: 88 % — ABNORMAL HIGH (ref 43–77)
PLATELETS: 96 10*3/uL — AB (ref 150–400)
RBC: 3.05 MIL/uL — ABNORMAL LOW (ref 3.87–5.11)
RDW: 15 % (ref 11.5–15.5)
WBC: 10.7 10*3/uL — ABNORMAL HIGH (ref 4.0–10.5)

## 2013-08-08 LAB — RETICULOCYTES
RBC.: 3.05 MIL/uL — ABNORMAL LOW (ref 3.87–5.11)
RETIC COUNT ABSOLUTE: 109.8 10*3/uL (ref 19.0–186.0)
Retic Ct Pct: 3.6 % — ABNORMAL HIGH (ref 0.4–3.1)

## 2013-08-08 LAB — LACTATE DEHYDROGENASE: LDH: 318 U/L — AB (ref 94–250)

## 2013-08-08 MED ORDER — ANTICOAGULANT SODIUM CITRATE 4% (200MG/5ML) IV SOLN
5.0000 mL | Freq: Once | Status: AC
  Administered 2013-08-08: 5 mL
  Filled 2013-08-08 (×2): qty 250

## 2013-08-08 MED ORDER — METHYLPREDNISOLONE SODIUM SUCC 125 MG IJ SOLR
INTRAMUSCULAR | Status: AC
  Administered 2013-08-08: 60 mg via INTRAVENOUS
  Filled 2013-08-08: qty 2

## 2013-08-08 MED ORDER — DIPHENHYDRAMINE HCL 25 MG PO CAPS
25.0000 mg | ORAL_CAPSULE | Freq: Four times a day (QID) | ORAL | Status: DC | PRN
  Administered 2013-08-08: 25 mg via ORAL

## 2013-08-08 MED ORDER — ACD FORMULA A 0.73-2.45-2.2 GM/100ML VI SOLN
500.0000 mL | Status: DC
  Administered 2013-08-08: 500 mL via INTRAVENOUS

## 2013-08-08 MED ORDER — CALCIUM CARBONATE ANTACID 500 MG PO CHEW
2.0000 | CHEWABLE_TABLET | ORAL | Status: AC
  Administered 2013-08-08 (×2): 400 mg via ORAL

## 2013-08-08 MED ORDER — ACD FORMULA A 0.73-2.45-2.2 GM/100ML VI SOLN
Status: AC
  Administered 2013-08-08: 500 mL via INTRAVENOUS
  Filled 2013-08-08: qty 500

## 2013-08-08 MED ORDER — DIPHENHYDRAMINE HCL 25 MG PO CAPS
ORAL_CAPSULE | ORAL | Status: AC
  Administered 2013-08-08: 25 mg via ORAL
  Filled 2013-08-08: qty 1

## 2013-08-08 MED ORDER — ACETAMINOPHEN 325 MG PO TABS: 650.0000 mg | ORAL_TABLET | ORAL | Status: DC | PRN

## 2013-08-08 MED ORDER — CALCIUM CARBONATE ANTACID 500 MG PO CHEW
CHEWABLE_TABLET | ORAL | Status: AC
  Administered 2013-08-08: 400 mg via ORAL
  Filled 2013-08-08: qty 4

## 2013-08-08 MED ORDER — METHYLPREDNISOLONE SODIUM SUCC 125 MG IJ SOLR
60.0000 mg | Freq: Once | INTRAMUSCULAR | Status: AC
  Administered 2013-08-08: 60 mg via INTRAVENOUS

## 2013-08-08 MED ORDER — SODIUM CHLORIDE 0.9 % IV SOLN
4.0000 g | Freq: Once | INTRAVENOUS | Status: AC
  Administered 2013-08-08: 4 g via INTRAVENOUS
  Filled 2013-08-08 (×2): qty 40

## 2013-08-08 NOTE — Progress Notes (Signed)
TPE- completed without issue. Tolerated well. Next appointment tomorrow 4/10 at 1pm. Pt agreeable. Per Dr. Beryle Beams will continue daily exchanges through Sunday.

## 2013-08-08 NOTE — Progress Notes (Signed)
Patient ID: [name], female   DOB: [birth year], 73 y.o.   MRN: [id number] Response to re-initiation of plasma exchange has been sluggish. After initial 8 exchanges  platelet count rose quickly to 157,000 and LDH normalized. One week off treatment, platelet count fell to 76,000. She has now had 6 daily exchanges with a modest improvement with platelet count 98,000 on April 8. Today's value is pending. She remains on steroids. She is becoming cushingoid. Her potassium has fallen to 3.1. She banged her right foot on a chair at home and has a large bruise. She developed oral Candida likely also related to the steroid use tonight she started her on Diflucan on Tuesday. She is tolerating Imuran 100 mg daily just started last week.  On exam she is alert and oriented, Blood pressure 144/62, pulse 90 regular, respirations 20, temperature 97.8. Candida exudate already resolved in the right posterior pharynx. Lungs are clear. Regular cardiac rhythm Vascular catheter left subclavian position surrounding ecchymoses. No tenderness or exudate at the entry site. Abdomen soft Extremities large ecchymosis but no skin breakdown dorsum right foot. Neurologic grossly normal.  Impression: Multiply relapsed TTP She will need to continue daily plasma exchange until we see a complete response and then I will try to taper her off plasma. I do not see that we are getting any clear benefit from the steroids only side effect so I going to start to taper her steroids down to 60 mg with the plasma exchange. I'm starting potassium 20 mEq twice daily x1 day then 20 mEq daily after that for hypokalemia. She will continue Diflucan as long as she is on the steroids. She is to followup in the office again on Monday, April 13.

## 2013-08-09 ENCOUNTER — Non-Acute Institutional Stay (HOSPITAL_COMMUNITY)
Admission: AD | Admit: 2013-08-09 | Discharge: 2013-08-09 | Disposition: A | Discharge status: Home / Self Care | Payer: Medicare Other | Source: Ambulatory Visit | Attending: Oncology | Admitting: Oncology

## 2013-08-09 ENCOUNTER — Other Ambulatory Visit: Payer: Self-pay | Admitting: Oncology

## 2013-08-09 DIAGNOSIS — M311 Thrombotic microangiopathy, unspecified: Secondary | ICD-10-CM | POA: Insufficient documentation

## 2013-08-09 DIAGNOSIS — M3119 Other thrombotic microangiopathy: Secondary | ICD-10-CM

## 2013-08-09 LAB — CBC WITH DIFFERENTIAL/PLATELET
Basophils Absolute: 0 10*3/uL (ref 0.0–0.1)
Basophils Relative: 0 % (ref 0–1)
EOS ABS: 0 10*3/uL (ref 0.0–0.7)
Eosinophils Relative: 0 % (ref 0–5)
HEMATOCRIT: 30.3 % — AB (ref 36.0–46.0)
Hemoglobin: 9.8 g/dL — ABNORMAL LOW (ref 12.0–15.0)
LYMPHS PCT: 6 % — AB (ref 12–46)
Lymphs Abs: 0.8 10*3/uL (ref 0.7–4.0)
MCH: 31.9 pg (ref 26.0–34.0)
MCHC: 32.3 g/dL (ref 30.0–36.0)
MCV: 98.7 fL (ref 78.0–100.0)
MONO ABS: 0.2 10*3/uL (ref 0.1–1.0)
Monocytes Relative: 2 % — ABNORMAL LOW (ref 3–12)
Neutro Abs: 12 10*3/uL — ABNORMAL HIGH (ref 1.7–7.7)
Neutrophils Relative %: 93 % — ABNORMAL HIGH (ref 43–77)
PLATELETS: 111 10*3/uL — AB (ref 150–400)
RBC: 3.07 MIL/uL — ABNORMAL LOW (ref 3.87–5.11)
RDW: 15.2 % (ref 11.5–15.5)
WBC: 13 10*3/uL — AB (ref 4.0–10.5)

## 2013-08-09 LAB — THERAPEUTIC PLASMA EXCHANGE (BLOOD BANK)
Plasma volume needed: 2600
UNIT DIVISION: 0
Unit division: 0
Unit division: 0
Unit division: 0
Unit division: 0
Unit division: 0
Unit division: 0
Unit division: 0
Unit division: 0
Unit division: 0

## 2013-08-09 LAB — COMPREHENSIVE METABOLIC PANEL
ALT: 23 U/L (ref 0–35)
AST: 29 U/L (ref 0–37)
Albumin: 3.5 g/dL (ref 3.5–5.2)
Alkaline Phosphatase: 57 U/L (ref 39–117)
BUN: 32 mg/dL — ABNORMAL HIGH (ref 6–23)
CO2: 29 meq/L (ref 19–32)
CREATININE: 1.03 mg/dL (ref 0.50–1.10)
Calcium: 9.5 mg/dL (ref 8.4–10.5)
Chloride: 99 mEq/L (ref 96–112)
GFR, EST AFRICAN AMERICAN: 62 mL/min — AB (ref >90)
GFR, EST NON AFRICAN AMERICAN: 53 mL/min — AB (ref >90)
GLUCOSE: 115 mg/dL — AB (ref 70–99)
Potassium: 4 mEq/L (ref 3.7–5.3)
Sodium: 143 mEq/L (ref 137–147)
TOTAL PROTEIN: 6.2 g/dL (ref 6.0–8.3)
Total Bilirubin: 0.4 mg/dL (ref 0.3–1.2)

## 2013-08-09 LAB — RETICULOCYTES
RBC.: 3.07 MIL/uL — AB (ref 3.87–5.11)
RETIC COUNT ABSOLUTE: 132 10*3/uL (ref 19.0–186.0)
Retic Ct Pct: 4.3 % — ABNORMAL HIGH (ref 0.4–3.1)

## 2013-08-09 LAB — LACTATE DEHYDROGENASE: LDH: 326 U/L — AB (ref 94–250)

## 2013-08-09 MED ORDER — DIPHENHYDRAMINE HCL 25 MG PO CAPS
25.0000 mg | ORAL_CAPSULE | Freq: Four times a day (QID) | ORAL | Status: DC | PRN
  Administered 2013-08-09: 25 mg via ORAL

## 2013-08-09 MED ORDER — ACD FORMULA A 0.73-2.45-2.2 GM/100ML VI SOLN
Status: AC
  Administered 2013-08-09: 500 mL via INTRAVENOUS
  Filled 2013-08-09: qty 500

## 2013-08-09 MED ORDER — ACD FORMULA A 0.73-2.45-2.2 GM/100ML VI SOLN
500.0000 mL | Status: DC
  Administered 2013-08-09: 500 mL via INTRAVENOUS

## 2013-08-09 MED ORDER — ACETAMINOPHEN 325 MG PO TABS: 650.0000 mg | ORAL_TABLET | ORAL | Status: DC | PRN

## 2013-08-09 MED ORDER — CALCIUM CARBONATE ANTACID 500 MG PO CHEW
2.0000 | CHEWABLE_TABLET | ORAL | Status: AC
  Administered 2013-08-09 (×2): 400 mg via ORAL

## 2013-08-09 MED ORDER — ANTICOAGULANT SODIUM CITRATE 4% (200MG/5ML) IV SOLN
5.0000 mL | Freq: Once | Status: AC
Start: 2013-08-09 — End: 2013-08-09
  Administered 2013-08-09: 5 mL
  Filled 2013-08-09: qty 250

## 2013-08-09 MED ORDER — METHYLPREDNISOLONE SODIUM SUCC 125 MG IJ SOLR
60.0000 mg | Freq: Once | INTRAMUSCULAR | Status: AC
  Administered 2013-08-09: 60 mg via INTRAVENOUS

## 2013-08-09 MED ORDER — CALCIUM CARBONATE ANTACID 500 MG PO CHEW
CHEWABLE_TABLET | ORAL | Status: AC
  Administered 2013-08-09: 400 mg via ORAL
  Filled 2013-08-09: qty 4

## 2013-08-09 MED ORDER — DIPHENHYDRAMINE HCL 25 MG PO CAPS
ORAL_CAPSULE | ORAL | Status: AC
  Administered 2013-08-09: 25 mg via ORAL
  Filled 2013-08-09: qty 1

## 2013-08-09 MED ORDER — METHYLPREDNISOLONE SODIUM SUCC 125 MG IJ SOLR
INTRAMUSCULAR | Status: AC
  Administered 2013-08-09: 60 mg via INTRAVENOUS
  Filled 2013-08-09: qty 2

## 2013-08-09 MED ORDER — SODIUM CHLORIDE 0.9 % IV SOLN
4.0000 g | Freq: Once | INTRAVENOUS | Status: AC
  Administered 2013-08-09: 4 g via INTRAVENOUS
  Filled 2013-08-09: qty 40

## 2013-08-09 NOTE — Progress Notes (Signed)
Plasma exchange completed without issue. Pt will return for next appointment tomorrow Saturday 11th at 11am. Pt tolerated well. Some edema in lower extremeties. Pt states she is on lower dose steroids now and that that should help

## 2013-08-10 ENCOUNTER — Non-Acute Institutional Stay (HOSPITAL_COMMUNITY)
Admission: EM | Admit: 2013-08-10 | Discharge: 2013-08-10 | Disposition: A | Discharge status: Home / Self Care | Payer: Medicare Other | Source: Other Acute Inpatient Hospital | Attending: Oncology | Admitting: Oncology

## 2013-08-10 ENCOUNTER — Other Ambulatory Visit: Payer: Self-pay | Admitting: Oncology

## 2013-08-10 DIAGNOSIS — M311 Thrombotic microangiopathy, unspecified: Secondary | ICD-10-CM | POA: Insufficient documentation

## 2013-08-10 DIAGNOSIS — M3119 Other thrombotic microangiopathy: Secondary | ICD-10-CM

## 2013-08-10 LAB — CBC WITH DIFFERENTIAL/PLATELET
BASOS ABS: 0 10*3/uL (ref 0.0–0.1)
BASOS PCT: 0 % (ref 0–1)
EOS ABS: 0 10*3/uL (ref 0.0–0.7)
Eosinophils Relative: 0 % (ref 0–5)
HCT: 28.1 % — ABNORMAL LOW (ref 36.0–46.0)
Hemoglobin: 9.2 g/dL — ABNORMAL LOW (ref 12.0–15.0)
Lymphocytes Relative: 8 % — ABNORMAL LOW (ref 12–46)
Lymphs Abs: 0.9 10*3/uL (ref 0.7–4.0)
MCH: 32.4 pg (ref 26.0–34.0)
MCHC: 32.7 g/dL (ref 30.0–36.0)
MCV: 98.9 fL (ref 78.0–100.0)
MONOS PCT: 1 % — AB (ref 3–12)
Monocytes Absolute: 0.2 10*3/uL (ref 0.1–1.0)
NEUTROS ABS: 10.9 10*3/uL — AB (ref 1.7–7.7)
Neutrophils Relative %: 91 % — ABNORMAL HIGH (ref 43–77)
Platelets: 108 10*3/uL — ABNORMAL LOW (ref 150–400)
RBC: 2.84 MIL/uL — AB (ref 3.87–5.11)
RDW: 15.1 % (ref 11.5–15.5)
WBC: 12 10*3/uL — AB (ref 4.0–10.5)

## 2013-08-10 LAB — COMPREHENSIVE METABOLIC PANEL
ALBUMIN: 3.3 g/dL — AB (ref 3.5–5.2)
ALT: 22 U/L (ref 0–35)
AST: 25 U/L (ref 0–37)
Alkaline Phosphatase: 68 U/L (ref 39–117)
BUN: 30 mg/dL — AB (ref 6–23)
CHLORIDE: 98 meq/L (ref 96–112)
CO2: 30 mEq/L (ref 19–32)
CREATININE: 0.95 mg/dL (ref 0.50–1.10)
Calcium: 9.7 mg/dL (ref 8.4–10.5)
GFR calc Af Amer: 68 mL/min — ABNORMAL LOW (ref >90)
GFR calc non Af Amer: 59 mL/min — ABNORMAL LOW (ref >90)
Glucose, Bld: 176 mg/dL — ABNORMAL HIGH (ref 70–99)
Potassium: 3.4 mEq/L — ABNORMAL LOW (ref 3.7–5.3)
Sodium: 142 mEq/L (ref 137–147)
TOTAL PROTEIN: 5.8 g/dL — AB (ref 6.0–8.3)
Total Bilirubin: 0.3 mg/dL (ref 0.3–1.2)

## 2013-08-10 LAB — LACTATE DEHYDROGENASE: LDH: 301 U/L — ABNORMAL HIGH (ref 94–250)

## 2013-08-10 LAB — RETICULOCYTES
RBC.: 2.84 MIL/uL — AB (ref 3.87–5.11)
Retic Count, Absolute: 130.6 10*3/uL (ref 19.0–186.0)
Retic Ct Pct: 4.6 % — ABNORMAL HIGH (ref 0.4–3.1)

## 2013-08-10 MED ORDER — ACETAMINOPHEN 325 MG PO TABS: 650.0000 mg | ORAL_TABLET | ORAL | Status: DC | PRN

## 2013-08-10 MED ORDER — DIPHENHYDRAMINE HCL 25 MG PO CAPS: 25.0000 mg | ORAL_CAPSULE | Freq: Four times a day (QID) | ORAL | Status: DC | PRN

## 2013-08-10 MED ORDER — SODIUM CHLORIDE 0.9 % IV SOLN
4.0000 g | Freq: Once | INTRAVENOUS | Status: AC
  Administered 2013-08-10: 4 g via INTRAVENOUS
  Filled 2013-08-10: qty 40

## 2013-08-10 MED ORDER — ACD FORMULA A 0.73-2.45-2.2 GM/100ML VI SOLN
500.0000 mL | Status: DC
  Administered 2013-08-10: 500 mL via INTRAVENOUS

## 2013-08-10 MED ORDER — CALCIUM CARBONATE ANTACID 500 MG PO CHEW
2.0000 | CHEWABLE_TABLET | ORAL | Status: DC
  Administered 2013-08-10: 400 mg via ORAL

## 2013-08-10 MED ORDER — METHYLPREDNISOLONE SODIUM SUCC 125 MG IJ SOLR
60.0000 mg | Freq: Once | INTRAMUSCULAR | Status: AC
  Administered 2013-08-10: 12:00:00 via INTRAVENOUS

## 2013-08-10 MED ORDER — DIPHENHYDRAMINE HCL 25 MG PO CAPS
ORAL_CAPSULE | ORAL | Status: AC
  Filled 2013-08-10: qty 1

## 2013-08-10 MED ORDER — ANTICOAGULANT SODIUM CITRATE 4% (200MG/5ML) IV SOLN
5.0000 mL | Freq: Once | Status: AC
  Administered 2013-08-10: 5 mL
  Filled 2013-08-10: qty 250

## 2013-08-10 MED ORDER — METHYLPREDNISOLONE SODIUM SUCC 125 MG IJ SOLR
INTRAMUSCULAR | Status: AC
  Filled 2013-08-10: qty 2

## 2013-08-10 NOTE — Progress Notes (Signed)
Pt in for [place] exchange. Tolerated the procedure well. States she has been feeling very tired and has +2pitting edema bilat in lower extremities. Is to come for next exchange on Sunday 4/12 at 3pm, pt is aware.

## 2013-08-11 ENCOUNTER — Non-Acute Institutional Stay (HOSPITAL_COMMUNITY)
Admission: AD | Admit: 2013-08-11 | Discharge: 2013-08-11 | Disposition: A | Discharge status: Home / Self Care | Payer: Medicare Other | Source: Ambulatory Visit | Attending: Oncology | Admitting: Oncology

## 2013-08-11 ENCOUNTER — Other Ambulatory Visit: Payer: Self-pay | Admitting: Oncology

## 2013-08-11 DIAGNOSIS — M311 Thrombotic microangiopathy, unspecified: Secondary | ICD-10-CM | POA: Insufficient documentation

## 2013-08-11 DIAGNOSIS — M3119 Other thrombotic microangiopathy: Secondary | ICD-10-CM

## 2013-08-11 LAB — THERAPEUTIC PLASMA EXCHANGE (BLOOD BANK)
Plasma Exchange: 2604
Plasma volume needed: 2600
Plasma volume needed: 2600
UNIT DIVISION: 0
UNIT DIVISION: 0
UNIT DIVISION: 0
UNIT DIVISION: 0
UNIT DIVISION: 0
UNIT DIVISION: 0
Unit division: 0
Unit division: 0
Unit division: 0
Unit division: 0
Unit division: 0
Unit division: 0
Unit division: 0
Unit division: 0
Unit division: 0
Unit division: 0
Unit division: 0
Unit division: 0
Unit division: 0

## 2013-08-11 MED ORDER — CALCIUM CARBONATE ANTACID 500 MG PO CHEW: 2.0000 | CHEWABLE_TABLET | ORAL | Status: AC

## 2013-08-11 MED ORDER — SODIUM CHLORIDE 0.9 % IV SOLN: 4.0000 g | Freq: Once | INTRAVENOUS | Status: DC

## 2013-08-11 MED ORDER — ACD FORMULA A 0.73-2.45-2.2 GM/100ML VI SOLN: 500.0000 mL | Status: DC

## 2013-08-11 MED ORDER — SODIUM CHLORIDE 0.9 % IV SOLN
4.0000 g | Freq: Once | INTRAVENOUS | Status: AC
  Administered 2013-08-11: 4 g via INTRAVENOUS
  Filled 2013-08-11: qty 40

## 2013-08-11 MED ORDER — ANTICOAGULANT SODIUM CITRATE 4% (200MG/5ML) IV SOLN
5.0000 mL | Freq: Once | Status: AC
  Administered 2013-08-11: 5 mL via INTRAVENOUS
  Filled 2013-08-11: qty 250

## 2013-08-11 MED ORDER — ACD FORMULA A 0.73-2.45-2.2 GM/100ML VI SOLN
500.0000 mL | Status: DC
  Administered 2013-08-11: 500 mL via INTRAVENOUS

## 2013-08-11 MED ORDER — SODIUM CHLORIDE 0.9 % IV SOLN: 60.0000 mg | Freq: Once | INTRAVENOUS | Status: DC

## 2013-08-11 MED ORDER — DIPHENHYDRAMINE HCL 25 MG PO CAPS: 25.0000 mg | ORAL_CAPSULE | Freq: Four times a day (QID) | ORAL | Status: DC | PRN

## 2013-08-11 MED ORDER — ACETAMINOPHEN 325 MG PO TABS: 650.0000 mg | ORAL_TABLET | ORAL | Status: DC | PRN

## 2013-08-11 MED ORDER — ANTICOAGULANT SODIUM CITRATE 4% (200MG/5ML) IV SOLN: 5.0000 mL | Freq: Once | Status: DC

## 2013-08-11 MED ORDER — DIPHENHYDRAMINE HCL 25 MG PO CAPS
ORAL_CAPSULE | ORAL | Status: AC
  Administered 2013-08-11: 25 mg
  Filled 2013-08-11: qty 1

## 2013-08-11 NOTE — Progress Notes (Signed)
Pt received TPE tx without problems. Pt will call HD unit Monday morning after her visit with Dr. Beryle Beams.

## 2013-08-12 ENCOUNTER — Telehealth: Payer: Self-pay | Admitting: *Deleted

## 2013-08-12 ENCOUNTER — Other Ambulatory Visit: Payer: Self-pay | Admitting: Oncology

## 2013-08-12 ENCOUNTER — Ambulatory Visit (INDEPENDENT_AMBULATORY_CARE_PROVIDER_SITE_OTHER): Payer: Medicare Other | Admitting: Oncology

## 2013-08-12 ENCOUNTER — Encounter: Payer: Self-pay | Admitting: Oncology

## 2013-08-12 ENCOUNTER — Encounter (HOSPITAL_COMMUNITY): Payer: Self-pay | Admitting: Surgery

## 2013-08-12 ENCOUNTER — Ambulatory Visit (HOSPITAL_COMMUNITY)
Admission: RE | Admit: 2013-08-12 | Discharge: 2013-08-12 | Disposition: A | Discharge status: Home / Self Care | Payer: Medicare Other | Source: Ambulatory Visit | Attending: Oncology | Admitting: Oncology

## 2013-08-12 ENCOUNTER — Inpatient Hospital Stay (HOSPITAL_COMMUNITY)
Admission: AD | Admit: 2013-08-12 | Discharge: 2013-08-14 | DRG: 190 | Disposition: A | Discharge status: Home / Self Care | Payer: Medicare Other | Source: Ambulatory Visit | Attending: Internal Medicine | Admitting: Internal Medicine

## 2013-08-12 VITALS — BP 136/62 | HR 95 | Temp 102.0°F | Ht 63.5 in

## 2013-08-12 DIAGNOSIS — M311 Thrombotic microangiopathy, unspecified: Secondary | ICD-10-CM | POA: Diagnosis present

## 2013-08-12 DIAGNOSIS — J440 Chronic obstructive pulmonary disease with acute lower respiratory infection: Secondary | ICD-10-CM | POA: Diagnosis present

## 2013-08-12 DIAGNOSIS — R509 Fever, unspecified: Secondary | ICD-10-CM

## 2013-08-12 DIAGNOSIS — J449 Chronic obstructive pulmonary disease, unspecified: Secondary | ICD-10-CM

## 2013-08-12 DIAGNOSIS — J209 Acute bronchitis, unspecified: Principal | ICD-10-CM | POA: Diagnosis present

## 2013-08-12 DIAGNOSIS — T380X5A Adverse effect of glucocorticoids and synthetic analogues, initial encounter: Secondary | ICD-10-CM | POA: Diagnosis present

## 2013-08-12 DIAGNOSIS — J441 Chronic obstructive pulmonary disease with (acute) exacerbation: Secondary | ICD-10-CM | POA: Diagnosis present

## 2013-08-12 DIAGNOSIS — R05 Cough: Secondary | ICD-10-CM

## 2013-08-12 DIAGNOSIS — M3119 Other thrombotic microangiopathy: Secondary | ICD-10-CM

## 2013-08-12 DIAGNOSIS — F17200 Nicotine dependence, unspecified, uncomplicated: Secondary | ICD-10-CM | POA: Diagnosis present

## 2013-08-12 DIAGNOSIS — J438 Other emphysema: Secondary | ICD-10-CM

## 2013-08-12 DIAGNOSIS — Z720 Tobacco use: Secondary | ICD-10-CM | POA: Diagnosis present

## 2013-08-12 DIAGNOSIS — B370 Candidal stomatitis: Secondary | ICD-10-CM | POA: Diagnosis present

## 2013-08-12 DIAGNOSIS — R5383 Other fatigue: Secondary | ICD-10-CM | POA: Diagnosis present

## 2013-08-12 DIAGNOSIS — R5381 Other malaise: Secondary | ICD-10-CM | POA: Diagnosis present

## 2013-08-12 DIAGNOSIS — R059 Cough, unspecified: Secondary | ICD-10-CM | POA: Insufficient documentation

## 2013-08-12 HISTORY — DX: Acute bronchitis, unspecified: J20.9

## 2013-08-12 HISTORY — DX: Chronic obstructive pulmonary disease with (acute) lower respiratory infection: J44.0

## 2013-08-12 LAB — THERAPEUTIC PLASMA EXCHANGE (BLOOD BANK)
PLASMA VOLUME NEEDED: 2600
UNIT DIVISION: 0
UNIT DIVISION: 0
UNIT DIVISION: 0
Unit division: 0
Unit division: 0
Unit division: 0
Unit division: 0
Unit division: 0
Unit division: 0
Unit division: 0
Unit division: 0

## 2013-08-12 LAB — POCT I-STAT, CHEM 8
BUN: 28 mg/dL — AB (ref 6–23)
BUN: 30 mg/dL — AB (ref 6–23)
BUN: 24 mg/dL — ABNORMAL HIGH (ref 6–23)
CHLORIDE: 97 meq/L (ref 96–112)
CREATININE: 1.3 mg/dL — AB (ref 0.50–1.10)
CREATININE: 1.2 mg/dL — AB (ref 0.50–1.10)
Calcium, Ion: 1.15 mmol/L (ref 1.13–1.30)
Calcium, Ion: 1.21 mmol/L (ref 1.13–1.30)
Calcium, Ion: 1 mmol/L — ABNORMAL LOW (ref 1.13–1.30)
Chloride: 97 mEq/L (ref 96–112)
Chloride: 95 mEq/L — ABNORMAL LOW (ref 96–112)
Creatinine, Ser: 1.2 mg/dL — ABNORMAL HIGH (ref 0.50–1.10)
Glucose, Bld: 115 mg/dL — ABNORMAL HIGH (ref 70–99)
Glucose, Bld: 181 mg/dL — ABNORMAL HIGH (ref 70–99)
Glucose, Bld: 147 mg/dL — ABNORMAL HIGH (ref 70–99)
HCT: 31 % — ABNORMAL LOW (ref 36.0–46.0)
HCT: 28 % — ABNORMAL LOW (ref 36.0–46.0)
HEMATOCRIT: 28 % — AB (ref 36.0–46.0)
Hemoglobin: 10.5 g/dL — ABNORMAL LOW (ref 12.0–15.0)
Hemoglobin: 9.5 g/dL — ABNORMAL LOW (ref 12.0–15.0)
Hemoglobin: 9.5 g/dL — ABNORMAL LOW (ref 12.0–15.0)
POTASSIUM: 3.9 meq/L (ref 3.7–5.3)
POTASSIUM: 4 meq/L (ref 3.7–5.3)
Potassium: 3.4 mEq/L — ABNORMAL LOW (ref 3.7–5.3)
SODIUM: 143 meq/L (ref 137–147)
Sodium: 142 mEq/L (ref 137–147)
Sodium: 143 mEq/L (ref 137–147)
TCO2: 30 mmol/L (ref 0–100)
TCO2: 30 mmol/L (ref 0–100)
TCO2: 37 mmol/L (ref 0–100)

## 2013-08-12 LAB — RETICULOCYTES
ABS Retic: 171.2 10*3/uL (ref 19.0–186.0)
RBC.: 3.08 MIL/uL — ABNORMAL LOW (ref 3.87–5.11)
Retic Ct Pct: 5.56 % — ABNORMAL HIGH (ref 0.4–2.3)

## 2013-08-12 LAB — COMPLETE METABOLIC PANEL WITH GFR
ALT: 27 U/L (ref 0–35)
AST: 27 U/L (ref 0–37)
Albumin: 3.5 g/dL (ref 3.5–5.2)
Alkaline Phosphatase: 67 U/L (ref 39–117)
BILIRUBIN TOTAL: 0.5 mg/dL (ref 0.3–1.2)
BUN: 22 mg/dL (ref 6–23)
CO2: 33 meq/L — AB (ref 19–32)
Calcium: 9.9 mg/dL (ref 8.4–10.5)
Chloride: 98 mEq/L (ref 96–112)
Creat: 0.99 mg/dL (ref 0.50–1.10)
GFR, EST AFRICAN AMERICAN: 66 mL/min
GFR, EST NON AFRICAN AMERICAN: 58 mL/min — AB
GLUCOSE: 95 mg/dL (ref 70–99)
POTASSIUM: 4 meq/L (ref 3.5–5.3)
Sodium: 143 mEq/L (ref 135–145)
TOTAL PROTEIN: 6.4 g/dL (ref 6.0–8.3)

## 2013-08-12 LAB — CBC WITH DIFFERENTIAL/PLATELET
BASOS ABS: 0 10*3/uL (ref 0.0–0.1)
BASOS PCT: 0 % (ref 0–1)
Eosinophils Absolute: 0.1 10*3/uL (ref 0.0–0.7)
Eosinophils Relative: 1 % (ref 0–5)
HEMATOCRIT: 31.1 % — AB (ref 36.0–46.0)
Hemoglobin: 9.6 g/dL — ABNORMAL LOW (ref 12.0–15.0)
Lymphocytes Relative: 8 % — ABNORMAL LOW (ref 12–46)
Lymphs Abs: 0.7 10*3/uL (ref 0.7–4.0)
MCH: 31.2 pg (ref 26.0–34.0)
MCHC: 30.9 g/dL (ref 30.0–36.0)
MCV: 101 fL — ABNORMAL HIGH (ref 78.0–100.0)
Monocytes Absolute: 0.2 10*3/uL (ref 0.1–1.0)
Monocytes Relative: 2 % — ABNORMAL LOW (ref 3–12)
NEUTROS ABS: 7.3 10*3/uL (ref 1.7–7.7)
Neutrophils Relative %: 89 % — ABNORMAL HIGH (ref 43–77)
PLATELETS: 123 10*3/uL — AB (ref 150–400)
RBC: 3.08 MIL/uL — ABNORMAL LOW (ref 3.87–5.11)
RDW: 15.1 % (ref 11.5–15.5)
WBC: 8.2 10*3/uL (ref 4.0–10.5)

## 2013-08-12 LAB — PRO B NATRIURETIC PEPTIDE: PRO B NATRI PEPTIDE: 2298 pg/mL — AB (ref 0–125)

## 2013-08-12 LAB — LACTATE DEHYDROGENASE: LDH: 306 U/L — ABNORMAL HIGH (ref 94–250)

## 2013-08-12 MED ORDER — CALCIUM CARBONATE ANTACID 500 MG PO CHEW: 2.0000 | CHEWABLE_TABLET | ORAL | Status: DC

## 2013-08-12 MED ORDER — ACETAMINOPHEN 325 MG PO TABS: 650.0000 mg | ORAL_TABLET | ORAL | Status: DC | PRN

## 2013-08-12 MED ORDER — CHOLECALCIFEROL 10 MCG (400 UNIT) PO TABS
400.0000 [IU] | ORAL_TABLET | Freq: Every day | ORAL | Status: DC
  Administered 2013-08-13 – 2013-08-14 (×2): 400 [IU] via ORAL
  Filled 2013-08-12 (×2): qty 1

## 2013-08-12 MED ORDER — SODIUM CHLORIDE 0.9 % IJ SOLN
3.0000 mL | Freq: Two times a day (BID) | INTRAMUSCULAR | Status: DC
  Administered 2013-08-12 – 2013-08-14 (×3): 3 mL via INTRAVENOUS

## 2013-08-12 MED ORDER — TIOTROPIUM BROMIDE MONOHYDRATE 18 MCG IN CAPS
18.0000 ug | ORAL_CAPSULE | Freq: Every day | RESPIRATORY_TRACT | Status: DC
  Administered 2013-08-14: 18 ug via RESPIRATORY_TRACT
  Filled 2013-08-12: qty 5

## 2013-08-12 MED ORDER — DIPHENHYDRAMINE HCL 25 MG PO CAPS: 25.0000 mg | ORAL_CAPSULE | Freq: Four times a day (QID) | ORAL | Status: DC | PRN

## 2013-08-12 MED ORDER — QUETIAPINE FUMARATE 25 MG PO TABS
25.0000 mg | ORAL_TABLET | Freq: Every day | ORAL | Status: DC
  Administered 2013-08-12 – 2013-08-13 (×2): 25 mg via ORAL
  Filled 2013-08-12 (×3): qty 1

## 2013-08-12 MED ORDER — ADULT MULTIVITAMIN W/MINERALS CH
1.0000 | ORAL_TABLET | Freq: Two times a day (BID) | ORAL | Status: DC
  Administered 2013-08-13 – 2013-08-14 (×2): 1 via ORAL
  Filled 2013-08-12 (×4): qty 1

## 2013-08-12 MED ORDER — FLUOXETINE HCL 20 MG PO CAPS
20.0000 mg | ORAL_CAPSULE | Freq: Every day | ORAL | Status: DC
  Administered 2013-08-13 – 2013-08-14 (×2): 20 mg via ORAL
  Filled 2013-08-12 (×2): qty 1

## 2013-08-12 MED ORDER — GABAPENTIN 400 MG PO CAPS
400.0000 mg | ORAL_CAPSULE | Freq: Three times a day (TID) | ORAL | Status: DC
  Administered 2013-08-12 – 2013-08-14 (×6): 400 mg via ORAL
  Filled 2013-08-12 (×8): qty 1

## 2013-08-12 MED ORDER — ANTICOAGULANT SODIUM CITRATE 4% (200MG/5ML) IV SOLN
5.0000 mL | Freq: Once | Status: AC
  Administered 2013-08-12: 5 mL
  Filled 2013-08-12: qty 250

## 2013-08-12 MED ORDER — ACD FORMULA A 0.73-2.45-2.2 GM/100ML VI SOLN: 500.0000 mL | Status: DC

## 2013-08-12 MED ORDER — METHYLPREDNISOLONE SODIUM SUCC 125 MG IJ SOLR
60.0000 mg | Freq: Once | INTRAMUSCULAR | Status: AC
Start: 2013-08-12 — End: 2013-08-12
  Administered 2013-08-12: 60 mg via INTRAVENOUS
  Filled 2013-08-12: qty 0.96

## 2013-08-12 MED ORDER — FLUCONAZOLE 100 MG PO TABS
100.0000 mg | ORAL_TABLET | Freq: Every day | ORAL | Status: DC
  Administered 2013-08-12 – 2013-08-14 (×3): 100 mg via ORAL
  Filled 2013-08-12 (×3): qty 1

## 2013-08-12 MED ORDER — VANCOMYCIN HCL IN DEXTROSE 750-5 MG/150ML-% IV SOLN
750.0000 mg | Freq: Two times a day (BID) | INTRAVENOUS | Status: DC
  Administered 2013-08-12 – 2013-08-14 (×4): 750 mg via INTRAVENOUS
  Filled 2013-08-12 (×5): qty 150

## 2013-08-12 MED ORDER — VITAMIN B-12 1000 MCG PO TABS
1000.0000 ug | ORAL_TABLET | Freq: Every day | ORAL | Status: DC
  Administered 2013-08-13 – 2013-08-14 (×2): 1000 ug via ORAL
  Filled 2013-08-12 (×2): qty 1

## 2013-08-12 MED ORDER — NICOTINE 21 MG/24HR TD PT24
21.0000 mg | MEDICATED_PATCH | Freq: Every day | TRANSDERMAL | Status: DC
  Administered 2013-08-13 – 2013-08-14 (×2): 21 mg via TRANSDERMAL
  Filled 2013-08-12 (×3): qty 1

## 2013-08-12 MED ORDER — METHYLPREDNISOLONE SODIUM SUCC 125 MG IJ SOLR
INTRAMUSCULAR | Status: AC
  Filled 2013-08-12: qty 2

## 2013-08-12 MED ORDER — ALBUTEROL SULFATE (2.5 MG/3ML) 0.083% IN NEBU: 2.5000 mg | INHALATION_SOLUTION | Freq: Four times a day (QID) | RESPIRATORY_TRACT | Status: DC | PRN

## 2013-08-12 MED ORDER — PREDNISONE 50 MG PO TABS
60.0000 mg | ORAL_TABLET | Freq: Every day | ORAL | Status: DC
  Filled 2013-08-12 (×2): qty 1

## 2013-08-12 MED ORDER — FOLIC ACID 1 MG PO TABS
1.0000 mg | ORAL_TABLET | Freq: Every day | ORAL | Status: DC
  Administered 2013-08-12 – 2013-08-14 (×3): 1 mg via ORAL
  Filled 2013-08-12 (×3): qty 1

## 2013-08-12 MED ORDER — ANTICOAGULANT SODIUM CITRATE 4% (200MG/5ML) IV SOLN: 5.0000 mL | Freq: Once | Status: DC

## 2013-08-12 MED ORDER — OXYCODONE-ACETAMINOPHEN 5-325 MG PO TABS: 2.0000 | ORAL_TABLET | Freq: Four times a day (QID) | ORAL | Status: DC | PRN

## 2013-08-12 MED ORDER — SODIUM CHLORIDE 0.9 % IV SOLN: 4.0000 g | Freq: Once | INTRAVENOUS | Status: DC

## 2013-08-12 MED ORDER — CALCIUM CARBONATE ANTACID 500 MG PO CHEW
CHEWABLE_TABLET | ORAL | Status: AC
  Administered 2013-08-12: 400 mg
  Filled 2013-08-12: qty 2

## 2013-08-12 MED ORDER — AZATHIOPRINE 50 MG PO TABS
100.0000 mg | ORAL_TABLET | Freq: Every day | ORAL | Status: DC
  Administered 2013-08-13 – 2013-08-14 (×2): 100 mg via ORAL
  Filled 2013-08-12 (×2): qty 2

## 2013-08-12 MED ORDER — CALCIUM GLUCONATE 10 % IV SOLN
4.0000 g | Freq: Once | INTRAVENOUS | Status: AC
  Administered 2013-08-12: 4 g via INTRAVENOUS
  Filled 2013-08-12: qty 40

## 2013-08-12 MED ORDER — ACD FORMULA A 0.73-2.45-2.2 GM/100ML VI SOLN
Status: AC
  Filled 2013-08-12: qty 500

## 2013-08-12 MED ORDER — ACD FORMULA A 0.73-2.45-2.2 GM/100ML VI SOLN
500.0000 mL | Status: DC
  Administered 2013-08-12: 500 mL via INTRAVENOUS
  Filled 2013-08-12: qty 500

## 2013-08-12 NOTE — Progress Notes (Signed)
ANTIBIOTIC CONSULT NOTE - INITIAL  Pharmacy Consult for vancomycin Indication: immunocompromised with fever  Allergies  Allergen Reactions  . Cefuroxime Axetil Other (See Comments)    "jittery"  . Adhesive [Tape] Other (See Comments)    blisters  . Other Other (See Comments)    [employer]: Reaction is hives [employer] tape: Reaction is blistering   . Sulfa Antibiotics Hives  . Sulfa Drugs Cross Reactors Other (See Comments)    unknown    Patient Measurements: Height: 5' 3.5" (161.3 cm) Weight: 182 lb 1.6 oz (82.6 kg) IBW/kg (Calculated) : 53.55   Vital Signs: Temp: 102 F (38.9 C) (04/13 1028) Temp src: Oral (04/13 1028) BP: 136/62 mmHg (04/13 1028) Pulse Rate: 95 (04/13 1028) Intake/Output from previous day:   Intake/Output from this shift:    Labs:  Recent Labs  08/10/13 1100 08/10/13 1151 08/12/13 1150  WBC 12.0*  --  8.2  HGB 9.2* 9.5* 9.6*  PLT 108*  --  123*  CREATININE 0.95 1.20* 0.99   Estimated Creatinine Clearance: 53.6 ml/min (by C-G formula based on Cr of 0.99). No results found for this basename: VANCOTROUGH, VANCOPEAK, VANCORANDOM, GENTTROUGH, GENTPEAK, GENTRANDOM, TOBRATROUGH, TOBRAPEAK, TOBRARND, AMIKACINPEAK, AMIKACINTROU, AMIKACIN,  in the last 72 hours   Microbiology: No results found for this or any previous visit (from the past 720 hour(s)).  Medical History: Past Medical History  Diagnosis Date  . Septicemia 03/2011  . Normal echocardiogram 03/15/11  . History of TTP (thrombotic thrombocytopenic purpura)   . Acute delirium   . Bacteremia 03/2011  . COPD (chronic obstructive pulmonary disease)   . Blood transfusion   . Anemia   . Arthritis   . Chronic back pain   . Spinal stenosis, lumbar   . Blood dyscrasia   . Depression     "mild"  . Tobacco abuse 05/10/2013  . Oral thrush 08/08/2013  . Hypokalemia 08/08/2013    Steroid related  . 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  Bronchitis, chronic obstructive w acute bronchitis 08/12/2013    Assessment: [name] with TTP undergoing plasmapheresis with Dr. Beryle Beams. WBC this morning 8.2 with Tmax 102. She is on steroids. SCr 0.99 with est CrCl ~34mL/min.  Goal of Therapy:  Vancomycin trough level 15-20 mcg/ml  Plan:  1. Vancomycin 750mg  IV q12h 2. Will follow renal function, changes to plasmapheresis plans/methods, clinical progression, any c/s  [name] D. [name], PharmD, BCPS Clinical Pharmacist Pager: [phone] 08/12/2013 2:41 PM

## 2013-08-12 NOTE — H&P (Signed)
Date: 08/12/2013               Patient Name:  [name] MRN: [id number]  DOB: 05-Sep-1940 Age / Sex: 73 y.o., female   PCP: Kirk Ruths, MD         Medical Service: Internal Medicine Teaching Service         Attending Physician: Dr. Bartholomew Crews, MD    First Contact: Dr. Joni Reining Pager: [phone]  Second Contact: Dr. Jerene Pitch Pager: [phone]       After Hours (After 5p/  First Contact Pager: [phone]  weekends / holidays): Second Contact Pager: [phone]   Chief Complaint: generalized weakness  History of Present Illness: [name] is a 73 yo female with PMH of TTP, COPD, tobacco abuse, she is currently followed by Dr. Beryle Beams she is currently receiving daily plasma exchange for her third relapse of TTP.  Of note 2 years ago while receiving treatment for her 2nd relapse she developed enterococcal sepsis, endocarditis, and an epidural abscess. Today she presented to the [facility] for a hematology clinic follow up with Dr. Waymon Budge.  She is accompanied by her daughter.  They report that she has had come congestion and "cold" symptoms for about 2 weeks.  They note that she has had a productive cough with white sputum.  In addition she has had hoarseness and sore throat for the past 3 days. They note that Mrs. Halberg husband has been sick for about the past 2 weeks with a URI for which he has taken a Z-pack.  In addition to these symptoms they note Mrs. Sakamoto has had increased lower extremity edema for the past few days, they note that this occasionally happens when she takes steroids but this it may be a little worse today.  She denies SOB or orthopnea.  She denies any chest pain or pain around the catheter site, she reports that this does not feel like her previous catheter infection.  Meds: Current Facility-Administered Medications  Medication Dose Route Frequency Provider Last Rate Last Dose  . acetaminophen (TYLENOL) tablet 650 mg  650 mg Oral Q4H PRN  Annia Belt, MD      . anticoagulant sodium citrate solution 5 mL  5 mL Intracatheter Once Annia Belt, MD      . Derrill Memo ON 08/13/2013] azaTHIOprine [facility]) tablet 100 mg  100 mg Oral Daily Joni Reining, DO      . calcium gluconate 4 g in sodium chloride 0.9 % 250 mL IVPB  4 g Intravenous Once Annia Belt, MD   4 g at 08/12/13 1802  . [START ON 08/13/2013] cholecalciferol (VITAMIN D) tablet 400 Units  400 Units Oral Daily Joni Reining, DO      . citrate dextrose (ACD-A anticoagulant) 0.73-2.45-2.2 GM/100ML solution           . citrate dextrose (ACD-A anticoagulant) solution 500 mL  500 mL Intravenous Continuous Annia Belt, MD      . diphenhydrAMINE (BENADRYL) capsule 25 mg  25 mg Oral Q6H PRN Annia Belt, MD      . fluconazole (DIFLUCAN) tablet 100 mg  100 mg Oral Daily Joni Reining, DO      . [START ON 08/13/2013] FLUoxetine (PROZAC) capsule 20 mg  20 mg Oral Daily Joni Reining, DO      . folic acid (FOLVITE) tablet 1 mg  1 mg Oral Daily Joni Reining, DO      . gabapentin (NEURONTIN) capsule  400 mg  400 mg Oral TID Joni Reining, DO      . methylPREDNISolone sodium succinate (SOLU-MEDROL) 125 mg/2 mL injection           . [START ON 08/13/2013] multivitamin with minerals tablet 1 tablet  1 tablet Oral BID Joni Reining, DO      . nicotine (NICODERM CQ - dosed in mg/24 hours) patch 21 mg  21 mg Transdermal Daily Joni Reining, DO      . oxyCODONE-acetaminophen (PERCOCET/ROXICET) 5-325 MG per tablet 2 tablet  2 tablet Oral Q6H PRN Joni Reining, DO      . Derrill Memo ON 08/13/2013] predniSONE (DELTASONE) tablet 60 mg  60 mg Oral Q breakfast Joni Reining, DO      . QUEtiapine (SEROQUEL) tablet 25 mg  25 mg Oral QHS Joni Reining, DO      . sodium chloride 0.9 % injection 3 mL  3 mL Intravenous Q12H Joni Reining, DO      . Derrill Memo ON 08/13/2013] tiotropium (SPIRIVA) inhalation capsule 18 mcg  18 mcg Inhalation Daily Joni Reining, DO      . vancomycin (VANCOCIN) IVPB 750  mg/150 ml premix  750 mg Intravenous Q12H Lauren Bajbus, RPH      . [START ON 08/13/2013] vitamin B-12 (CYANOCOBALAMIN) tablet 1,000 mcg  1,000 mcg Oral Daily Joni Reining, DO       Facility-Administered Medications Ordered in Other Encounters  Medication Dose Route Frequency Provider Last Rate Last Dose  . acetaminophen (TYLENOL) tablet 650 mg  650 mg Oral Q4H PRN Annia Belt, MD      . acetaminophen (TYLENOL) tablet 650 mg  650 mg Oral Q4H PRN Annia Belt, MD      . anticoagulant sodium citrate solution 5 mL  5 mL Intracatheter Once Annia Belt, MD      . anticoagulant sodium citrate solution 5 mL  5 mL Intracatheter Once Annia Belt, MD      . calcium gluconate 4 g in sodium chloride 0.9 % 250 mL IVPB  4 g Intravenous Once Annia Belt, MD      . calcium gluconate 4 g in sodium chloride 0.9 % 250 mL IVPB  4 g Intravenous Once Annia Belt, MD      . citrate dextrose (ACD-A anticoagulant) solution 500 mL  500 mL Intravenous Continuous Annia Belt, MD      . citrate dextrose (ACD-A anticoagulant) solution 500 mL  500 mL Intravenous Continuous Annia Belt, MD      . diphenhydrAMINE (BENADRYL) capsule 25 mg  25 mg Oral Q6H PRN Annia Belt, MD      . diphenhydrAMINE (BENADRYL) capsule 25 mg  25 mg Oral Q6H PRN Annia Belt, MD      . methylPREDNISolone sodium succinate (SOLU-MEDROL) 60 mg in sodium chloride 0.9 % 50 mL IVPB  60 mg Intravenous Once Annia Belt, MD      . sodium chloride 0.9 % injection 10 mL  10 mL Intracatheter PRN Annia Belt, MD   10 mL at 07/19/13 1740    Allergies: Allergies as of 08/12/2013 - Review Complete 08/12/2013  Allergen Reaction Noted  . Cefuroxime axetil Other (See Comments) 11/05/2012  . Adhesive [tape] Other (See Comments) 05/05/2011  . Other Other (See Comments) 03/16/2011  . Sulfa antibiotics Hives 03/16/2011  . Sulfa drugs cross reactors Other (See Comments)  03/16/2011   Past Medical History  Diagnosis Date  . Septicemia 03/2011  .  Normal echocardiogram 03/15/11  . History of TTP (thrombotic thrombocytopenic purpura)   . Acute delirium   . Bacteremia 03/2011  . COPD (chronic obstructive pulmonary disease)   . Blood transfusion   . Anemia   . Arthritis   . Chronic back pain   . Spinal stenosis, lumbar   . Blood dyscrasia   . Depression     "mild"  . Tobacco abuse 05/10/2013  . Oral thrush 08/08/2013  . Hypokalemia 08/08/2013    Steroid related  . 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 Bronchitis, chronic obstructive w acute bronchitis 08/12/2013   Past Surgical History  Procedure Laterality Date  . Cesarean section  10/09/1976  . Tonsillectomy    . Tubal ligation    . Cataract extraction w/ intraocular lens  implant, bilateral  ~ 2010  . Eye surgery    . Lumbar laminectomy/decompression microdiscectomy  03/16/2011    Procedure: LUMBAR LAMINECTOMY/DECOMPRESSION MICRODISCECTOMY;  Surgeon: Mariam Dollar;  Location: [facility] NEURO ORS;  Service: Neurosurgery;  Laterality: N/A;  . Tee without cardioversion  03/21/2011    Procedure: TRANSESOPHAGEAL ECHOCARDIOGRAM (TEE);  Surgeon: Pamella Pert;  Location: [facility] ENDOSCOPY;  Service: Cardiovascular;  Laterality: N/A;  TEE for vegetations  . Insertion of dialysis catheter Left 07/12/2013    Procedure: INSERTION OF DIALYSIS CATHETER   with Ultrasound;  Surgeon: Larina Earthly, MD;  Location: [facility] OR;  Service: Vascular;  Laterality: Left;   History reviewed. No pertinent family history. History   Social History  . Marital Status: Married    Spouse Name: N/A    Number of Children: N/A  . Years of Education: N/A   Occupational History  . Not on file.   Social History Main Topics  . Smoking status: Current Every Day Smoker -- 2.50 packs/day for 55 years    Types: Cigarettes    Last Attempt to Quit: 03/09/2011  . Smokeless tobacco: Never Used  . Alcohol Use: Yes      Comment: very seldom.  . Drug Use: No  . Sexual Activity: Not on file   Other Topics Concern  . Not on file   Social History Narrative  . No narrative on file    Review of Systems: Review of Systems  Constitutional: Positive for malaise/fatigue. Negative for fever (unknown prior to arrival in clinic) and chills.  HENT: Positive for congestion and sore throat. Negative for hearing loss and tinnitus.   Eyes: Negative for blurred vision, double vision, photophobia and pain.  Respiratory: Positive for cough and sputum production (white). Negative for shortness of breath.   Cardiovascular: Positive for leg swelling. Negative for chest pain.  Gastrointestinal: Positive for constipation. Negative for heartburn, abdominal pain, diarrhea, blood in stool and melena.  Genitourinary: Negative for dysuria, urgency and frequency.  Musculoskeletal: Negative for joint pain and myalgias.  Neurological: Positive for weakness. Negative for dizziness, focal weakness, loss of consciousness and headaches.  Psychiatric/Behavioral: The patient is not nervous/anxious.      Physical Exam: Blood pressure 139/61, pulse 87, temperature 100.6 F (38.1 C), temperature source Oral, resp. rate 17, height  (1.626 m), weight 176 lb 12.9 oz (80.2 kg), SpO2 93.00%. Physical Exam  Nursing note and vitals reviewed. Constitutional: She is well-developed, well-nourished, and in no distress.  HENT:  Head: Normocephalic and atraumatic.  Mouth/Throat: Mucous membranes are normal. No oral lesions. No uvula swelling. No oropharyngeal exudate or posterior oropharyngeal erythema.  Post nasal drip  Eyes: Conjunctivae and EOM are normal.  Cardiovascular: Normal rate, regular rhythm, normal heart sounds and intact distal pulses.   Pulmonary/Chest: Effort normal. No respiratory distress. She has wheezes (mild end expiratory wheezing). She has no rales.  Abdominal: Soft. Bowel sounds are normal. She exhibits no distension.  There is no tenderness.  Musculoskeletal: She exhibits edema (2+ feet, 1+ to shins bilaterally).  Skin:     Multiple areas of ecchymosis including right foot, left side of chest.     Lab results: Basic Metabolic Panel:  Recent Labs  08/10/13 1100  08/12/13 1150 08/12/13 1742  NA 142  < > 143 143  K 3.4*  < > 4.0 4.0  CL 98  < > 98 95*  CO2 30  --  33*  --   GLUCOSE 176*  < > 95 147*  BUN 30*  < > 22 24*  CREATININE 0.95  < > 0.99 1.20*  CALCIUM 9.7  --  9.9  --   < > = values in this interval not displayed. Liver Function Tests:  Recent Labs  08/10/13 1100 08/12/13 1150  AST 25 27  ALT 22 27  ALKPHOS 68 67  BILITOT 0.3 0.5  PROT 5.8* 6.4  ALBUMIN 3.3* 3.5   CBC:  Recent Labs  08/10/13 1100  08/12/13 1150 08/12/13 1742  WBC 12.0*  --  8.2  --   NEUTROABS 10.9*  --  7.3  --   HGB 9.2*  < > 9.6* 9.5*  HCT 28.1*  < > 31.1* 28.0*  MCV 98.9  --  101.0*  --   PLT 108*  --  123*  --   < > = values in this interval not displayed. Anemia Panel:  Recent Labs  08/12/13 1150  RETICCTPCT 5.56*    Imaging results:  Dg Chest 2 View  08/12/2013   CLINICAL DATA:  Cough and fever  EXAM: CHEST  2 VIEW  COMPARISON:  July 12, 2013  FINDINGS: Central catheter tip is at cavoatrial junction. No pneumothorax. There is underlying emphysematous change. There is no edema or consolidation. The heart size is normal. The pulmonary vascularity reflects underlying emphysematous change.  IMPRESSION: Underlying emphysematous change. No edema or consolidation. No pneumothorax.   Electronically Signed   By: Lowella Grip M.D.   On: 08/12/2013 12:54    Other results: EKG: pending  Assessment & Plan by Problem:  Fever - Patient immunosuppressed given her TTP treatment with steroids.  She presented to clinic with a fever of 102.  A CXR was obtained which showed no infiltrate.  She denies any urinary symptoms, denies head ache or neck pain, denies diarrhea.  She denies any pain at  her catheter site.  She does report a productive cough, and sinus congestion.  Her symptoms suggest a likely viral URI causing bronchitis however bacterial source especially from cathter will need to be ruled out. - Admit to inpatient for workup of fever, IV Abx. - Start IV Vancomycin given concern for possible catheter associated infection - Blood Cultures x2.  - Monitor Fever - Check U/A - Respiratory Virus panel - AM CBC and CMP    TTP (thrombotic thrombocytopenic purpura) -Currently receiving treatment for 3rd recurrence.  She has had a total of 18 plasma exchanges, and 4 doses of Rituxan, and was started on azithroprine 100mg  daily one week ago. - Continue daily plasma exchange per Dr. Azucena Freed orders - Continue prednisone 60mg  QAM - Continue Azithroprine 100mg  daily  COPD -Mild wheezing  on exam but does not appear to have an acute exacerbation. (Patinet also receiving oral steroids for TTP) - Continue Spirivia daily -Albuterol neb q6prn  Oral Candidiasis -Likely secondary to steroids - Continue Diflucan daily  Tobacco Abuse -Smokes 2.5 ppd - Nicotine patch while inpatient. -Encourage smoking cessation  DVT PPx: SCDs (given TTP) Diet: Regular Code Status: Full Dispo: Disposition is deferred at this time, awaiting improvement of current medical problems. Anticipated discharge in approximately 2 day(s).   The patient does have a current PCP Kirk Ruths, MD) and does not need an [facility] hospital follow-up appointment after discharge.  The patient does not have transportation limitations that hinder transportation to clinic appointments.  Signed: Joni Reining, DO 08/12/2013, 6:13 PM

## 2013-08-12 NOTE — Progress Notes (Signed)
Patient ID: [name], female   DOB: 04/26/1941, 73 y.o.   MRN: [id number] Hematology and Oncology Follow Up Visit  NATHANIA WALDMAN [id number] 1941/01/15 73 y.o. 08/12/2013 4:24 PM   Principle Diagnosis: Encounter Diagnoses  Name Primary?  . Bronchitis, chronic obstructive w acute bronchitis Yes  . TTP (thrombotic thrombocytopenic purpura)      Interim History:    Short interval followup visit for this 73 year old woman in her third relapse of TTP. She was late for her visit today since it took family members over an hour to get her mobilized. Over the last 24-hour she has developed profound weakness and increased peripheral edema without any increased dyspnea, PND, or orthopnea. Over the same interval, she has developed a bronchitis productive of clear sputum. She denied any fever but on arrival to the clinic temperature was 102. She has had some blurred vision but no diplopia and no headache. No ischemic type chest pain or pressure. She continues to smoke. She remains on daily plasma exchange and steroids. Steroids and Rituxan started at time of early relapse on 07/05/2013. Secondary to progressive decline in her platelet count and rising  LDH, she was admitted to the hospital to begin plasma exchange on March 13. Following 8 exchanges platelet count rose from a nadir of 57,000 up to 157,000 an LDH normalized. Plasma exchange was held but after just one week, platelet count fell down to 76,000 on April 3 and daily plasma exchange was resumed as an outpatient. She has now had  10 additional exchanges for a total of 18 exchanges. She received 4 doses of Rituxan. One week ago I started her on Imuran 100 mg by mouth daily in additional attempts to maintain her remission. Platelet count has been slow to respond with the second series of plasma exchanges and has plateaued around 100,000. LDH has come down from a peak value of 412 on April 4 to most recent value of 301 on April 11. Of note, her  bilirubin has never been elevated. Reticulocyte counts have been very variable between 2 and 5%. Hemoglobin has been relatively stable and she has not required any blood transfusion support. At time of her office visit with me on April 6, she had a Candida pharyngeal exudate and was started on Diflucan.  Additional review of systems: A small amount of blood was noted in the urine. No urinary frequency or dysuria. She has had some pain in the cuticle area of her right index finger. She has not noted any pain or tenderness around her vascular catheter. No shaking chills. Daughter reports that her mother has been taking excessive laxatives. She also reports  the appearance of peripheral edema over the last 24 hours. 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 She listened to her mother's lungs and they were clear. She checked her temperature and she was afebrile at home.   Medications: reviewed      Allergies:  Allergies  Allergen Reactions  . Cefuroxime Axetil Other (See Comments)    "jittery"  . Adhesive [Tape] Other (See Comments)    blisters  . Other Other (See Comments)    [employer]: Reaction is hives [employer] tape: Reaction is blistering   . Sulfa Antibiotics Hives  . Sulfa Drugs Cross Reactors Other (See Comments)    unknown    Review of Systems: In addition to history of present illness: Hematology:  No active bleeding or bruising ENT ROS: No sore throat Breast ROS:  Respiratory ROS: No increase in baseline dyspnea on exertion. Positive increased cough. See history of present illness. Cardiovascular ROS:  See history of present illness. Gastrointestinal ROS: No abdominal pain or change in bowel habit.   Genito-Urinary ROS: See history of present illness.  Musculoskeletal ROS: No change in chronic low back pain. Neurological ROS: See history of present illness Dermatological ROS: No new rash or ecchymosis. Remaining ROS negative:    Physical Exam: Blood pressure 136/62, pulse 95, temperature 102 F (38.9 C), temperature source Oral, height 5' 3.5" (1.613 m), weight 0 lb (0 kg), SpO2 91.00%. Wt Readings from Last 3 Encounters:  08/12/13 182 lb 1.6 oz (82.6 kg)  08/11/13 182 lb 1.6 oz (82.6 kg)  08/10/13 183 lb 10.3 oz (83.3 kg)     General appearance: Caucasian woman in no acute distress. She appears acutely ill. HENNT: Face is cushingoid from steroids. Pharynx no erythema, exudate, mass, or ulcer. Resolved Candida exudate. No thyromegaly or thyroid nodules Lymph nodes: No cervical, supraclavicular, or axillary lymphadenopathy Breasts:  Lungs: Coarse rales right lung base. No egophony., resonant to percussion throughout Heart: Regular rhythm, no murmur, no gallop, no rub, no click, no edema Abdomen: Soft, nontender, normal bowel sounds, no mass, no organomegaly Extremities: No edema, no calf tenderness Musculoskeletal: no joint deformities GU:  Vascular: Carotid pulses 2+, no bruits, distal pulses: No tenderness or erythema around the entry site of left subclavian vascular catheter Neurologic: Alert, oriented, PERRLA, intraocular lens implants. cranial nerves grossly normal, motor strength 5 over 5, reflexes 2+ symmetric, upper body coordination normal, gait not tested Skin: No rash; resolving ecchymoses around vascular catheter   Lab Results: CBC W/Diff    Component Value Date/Time   WBC 8.2 08/12/2013 1150   WBC 10.6* 08/02/2013 1008   WBC 8.6 11/09/2007 1100   RBC 3.08* 08/12/2013 1150   RBC 3.08* 08/12/2013 1150   RBC 3.99 08/02/2013 1008   RBC 4.74 11/09/2007 1100   HGB 9.6* 08/12/2013 1150   HGB 12.6 08/02/2013 1008   HGB 14.4 11/09/2007 1100   HCT 31.1* 08/12/2013 1150   HCT 38.8 08/02/2013 1008   HCT 42.6 11/09/2007 1100   PLT 123* 08/12/2013 1150   PLT 76* 08/02/2013 1008   PLT 233 11/09/2007 1100   MCV 101.0* 08/12/2013 1150   MCV 97.2 08/02/2013 1008   MCV 90 11/09/2007 1100   MCH 31.2 08/12/2013 1150   MCH  31.6 08/02/2013 1008   MCH 30.3 11/09/2007 1100   MCHC 30.9 08/12/2013 1150   MCHC 32.5 08/02/2013 1008   MCHC 33.7 11/09/2007 1100   RDW 15.1 08/12/2013 1150   RDW 14.8* 08/02/2013 1008   RDW 12.5 11/09/2007 1100   LYMPHSABS 0.7 08/12/2013 1150   LYMPHSABS 0.2* 08/02/2013 1008   LYMPHSABS 2.5 11/09/2007 1100   MONOABS 0.2 08/12/2013 1150   MONOABS 0.7 08/02/2013 1008   EOSABS 0.1 08/12/2013 1150   EOSABS 0.0 08/02/2013 1008   EOSABS 0.1 11/09/2007 1100   BASOSABS 0.0 08/12/2013 1150   BASOSABS 0.0 08/02/2013 1008   BASOSABS 0.1 11/09/2007 1100     Chemistry      Component Value Date/Time   NA 143 08/12/2013 1150   NA 140 07/26/2013 1020   K 4.0 08/12/2013 1150   K 4.6 07/26/2013 1020   CL 98 08/12/2013 1150   CL 107 05/22/2012 1023   CO2 33* 08/12/2013 1150   CO2 22 07/26/2013 1020   BUN 22 08/12/2013 1150   BUN 35.1* 07/26/2013 1020  CREATININE 0.99 08/12/2013 1150   CREATININE 1.20* 08/10/2013 1151   CREATININE 0.9 07/26/2013 1020      Component Value Date/Time   CALCIUM 9.9 08/12/2013 1150   CALCIUM 8.7 07/26/2013 1020   ALKPHOS 67 08/12/2013 1150   ALKPHOS 67 07/26/2013 1020   AST 27 08/12/2013 1150   AST 15 07/26/2013 1020   ALT 27 08/12/2013 1150   ALT 27 07/26/2013 1020   BILITOT 0.5 08/12/2013 1150   BILITOT 0.40 07/26/2013 1020       Radiological Studies: Dg Chest 2 View  08/12/2013   CLINICAL DATA:  Cough and fever  EXAM: CHEST  2 VIEW  COMPARISON:  July 12, 2013  FINDINGS: Central catheter tip is at cavoatrial junction. No pneumothorax. There is underlying emphysematous change. There is no edema or consolidation. The heart size is normal. The pulmonary vascularity reflects underlying emphysematous change.  IMPRESSION: Underlying emphysematous change. No edema or consolidation. No pneumothorax.   Electronically Signed   By: Lowella Grip M.D.   On: 08/12/2013 12:54    Impression:  #1. Acute bronchitis with associated high fever and generalized weakness in a immunocompromised woman on  chronic high-dose steroids and recently started Imuran.  #2. Third relapse of TTP under active treatment with steroids, plasma exchange, Rituxan, and Imuran. Platelet count today actually better than it has been in the last week since plasma exchange resumed.  #3. Obstructive airway disease due to ongoing tobacco addiction.  #4. History of an epidural abscess  In view of her immunocompromised status, although on the surface this appears to be a simple bronchitis, and her vascular catheter does not appear to be grossly infected, catheter has now been in place for over one month and remains a potential source of infection. Her profound weakness and high fever appear disproportionate to what I would expect  from a simple bronchitis. I am obtaining a chest radiograph to rule out early pneumonia. I have asked the inpatient team to evaluate her for admission and parenteral antibiotics pending cultures. I have suggested the addition of vancomycin secondary to the vascular catheter. I would give additional coverage for her bronchitis. I would continue her oral Diflucan and Imuran. She is getting daily steroids with her plasma exchange. I don't think she needs a stress dose. I feel once we get her started on antibiotics, she is stable to continue daily plasma exchange. I have notified the dialysis unit of the delay in starting the plasma exchange today. I will put in daily orders for her plasma exchange.  Case discussed with the medical residents and inpatient attending.    CC: Patient Care Team: Kirk Ruths, MD as PCP - General (Unknown Physician Specialty)   Annia Belt, MD 4/13/20154:24 PM

## 2013-08-12 NOTE — Telephone Encounter (Signed)
Called pt's daughter - wanting to know what to do about bringing her mother to a later appt.  Stated when she went by  pt's house this morning to get her ready for her 8:45Am appt, she was very weak and feet are very Edematous and will not be able to make this appt.;it will take 45 mins to get here. Informed her he does not have afternoon appts. Dr Beryle Beams said pt needs to come now. Daughter instructed to bring pt now; she agreed - will be here around 1030AM.

## 2013-08-13 DIAGNOSIS — J449 Chronic obstructive pulmonary disease, unspecified: Secondary | ICD-10-CM

## 2013-08-13 DIAGNOSIS — B370 Candidal stomatitis: Secondary | ICD-10-CM

## 2013-08-13 DIAGNOSIS — M311 Thrombotic microangiopathy, unspecified: Secondary | ICD-10-CM

## 2013-08-13 DIAGNOSIS — F17200 Nicotine dependence, unspecified, uncomplicated: Secondary | ICD-10-CM

## 2013-08-13 DIAGNOSIS — R509 Fever, unspecified: Secondary | ICD-10-CM

## 2013-08-13 LAB — URINALYSIS W MICROSCOPIC (NOT AT ARMC)
Bilirubin Urine: NEGATIVE
Glucose, UA: NEGATIVE mg/dL
HGB URINE DIPSTICK: NEGATIVE
Ketones, ur: NEGATIVE mg/dL
Leukocytes, UA: NEGATIVE
NITRITE: NEGATIVE
Protein, ur: NEGATIVE mg/dL
Specific Gravity, Urine: 1.013 (ref 1.005–1.030)
UROBILINOGEN UA: 0.2 mg/dL (ref 0.0–1.0)
pH: 7.5 (ref 5.0–8.0)

## 2013-08-13 LAB — THERAPEUTIC PLASMA EXCHANGE (BLOOD BANK)
PLASMA VOLUME NEEDED: 2600
Plasma Exchange: 2600
UNIT DIVISION: 0
UNIT DIVISION: 0
UNIT DIVISION: 0
Unit division: 0
Unit division: 0
Unit division: 0
Unit division: 0
Unit division: 0
Unit division: 0
Unit division: 0

## 2013-08-13 LAB — GLUCOSE, CAPILLARY: GLUCOSE-CAPILLARY: 109 mg/dL — AB (ref 70–99)

## 2013-08-13 LAB — COMPREHENSIVE METABOLIC PANEL
ALT: 17 U/L (ref 0–35)
AST: 18 U/L (ref 0–37)
Albumin: 3.1 g/dL — ABNORMAL LOW (ref 3.5–5.2)
Alkaline Phosphatase: 56 U/L (ref 39–117)
BUN: 29 mg/dL — AB (ref 6–23)
CALCIUM: 9.4 mg/dL (ref 8.4–10.5)
CO2: 31 meq/L (ref 19–32)
CREATININE: 0.98 mg/dL (ref 0.50–1.10)
Chloride: 103 mEq/L (ref 96–112)
GFR calc non Af Amer: 57 mL/min — ABNORMAL LOW (ref >90)
GFR, EST AFRICAN AMERICAN: 66 mL/min — AB (ref >90)
GLUCOSE: 122 mg/dL — AB (ref 70–99)
Potassium: 4.4 mEq/L (ref 3.7–5.3)
Sodium: 145 mEq/L (ref 137–147)
TOTAL PROTEIN: 5.9 g/dL — AB (ref 6.0–8.3)
Total Bilirubin: 0.5 mg/dL (ref 0.3–1.2)

## 2013-08-13 LAB — CBC
HEMATOCRIT: 28.4 % — AB (ref 36.0–46.0)
HEMOGLOBIN: 9 g/dL — AB (ref 12.0–15.0)
MCH: 31.6 pg (ref 26.0–34.0)
MCHC: 31.7 g/dL (ref 30.0–36.0)
MCV: 99.6 fL (ref 78.0–100.0)
Platelets: 115 10*3/uL — ABNORMAL LOW (ref 150–400)
RBC: 2.85 MIL/uL — AB (ref 3.87–5.11)
RDW: 14.9 % (ref 11.5–15.5)
WBC: 8.3 10*3/uL (ref 4.0–10.5)

## 2013-08-13 LAB — PROTIME-INR
INR: 1.16 (ref 0.00–1.49)
PROTHROMBIN TIME: 14.6 s (ref 11.6–15.2)

## 2013-08-13 LAB — APTT: APTT: 27 s (ref 24–37)

## 2013-08-13 MED ORDER — SODIUM CHLORIDE 0.9 % IV SOLN
4.0000 g | Freq: Once | INTRAVENOUS | Status: AC
  Administered 2013-08-13: 4 g via INTRAVENOUS
  Filled 2013-08-13: qty 40

## 2013-08-13 MED ORDER — DIPHENHYDRAMINE HCL 25 MG PO CAPS: 25.0000 mg | ORAL_CAPSULE | Freq: Four times a day (QID) | ORAL | Status: DC | PRN

## 2013-08-13 MED ORDER — CEPASTAT 14.5 MG MT LOZG
1.0000 | LOZENGE | OROMUCOSAL | Status: DC | PRN
  Administered 2013-08-13 – 2013-08-14 (×2): 1 via BUCCAL
  Filled 2013-08-13: qty 18

## 2013-08-13 MED ORDER — CALCIUM CARBONATE ANTACID 500 MG PO CHEW
CHEWABLE_TABLET | ORAL | Status: AC
  Filled 2013-08-13: qty 2

## 2013-08-13 MED ORDER — ACETAMINOPHEN 325 MG PO TABS: 650.0000 mg | ORAL_TABLET | ORAL | Status: DC | PRN

## 2013-08-13 MED ORDER — CALCIUM CARBONATE ANTACID 500 MG PO CHEW
2.0000 | CHEWABLE_TABLET | ORAL | Status: AC
  Administered 2013-08-13: 400 mg via ORAL
  Filled 2013-08-13 (×2): qty 2

## 2013-08-13 MED ORDER — ACD FORMULA A 0.73-2.45-2.2 GM/100ML VI SOLN
500.0000 mL | Status: DC
  Filled 2013-08-13: qty 500

## 2013-08-13 MED ORDER — METHYLPREDNISOLONE SODIUM SUCC 125 MG IJ SOLR
INTRAMUSCULAR | Status: AC
  Filled 2013-08-13: qty 2

## 2013-08-13 MED ORDER — ANTICOAGULANT SODIUM CITRATE 4% (200MG/5ML) IV SOLN
5.0000 mL | Freq: Once | Status: AC
  Administered 2013-08-13: 5 mL
  Filled 2013-08-13: qty 250

## 2013-08-13 MED ORDER — METHYLPREDNISOLONE SODIUM SUCC 125 MG IJ SOLR
60.0000 mg | Freq: Once | INTRAMUSCULAR | Status: AC
  Administered 2013-08-13: 60 mg via INTRAVENOUS
  Filled 2013-08-13: qty 0.96

## 2013-08-13 MED ORDER — ACD FORMULA A 0.73-2.45-2.2 GM/100ML VI SOLN
Status: AC
  Administered 2013-08-13: 10:00:00
  Filled 2013-08-13: qty 500

## 2013-08-13 NOTE — Progress Notes (Signed)
Patient ID: EMILEA GOGA, female   DOB: 1940/09/20, 73 y.o.   MRN: [id number] I appreciate assistance from Housestaff. CXR reviewed. No infiltrate. Temp down. On single agent Vancomycin. Diffuse expiratory wheezes.  RLL rales resolved. Pharynx no exudate. Resolved peripheral edema with bedrest Moderate elevation of pro BNP; no prior cardiac history Platelet count 124,000 on admission - better than previous baseline; slightly lower today @ 115,000. LDH 306 4/13 Impression: 1.  Acute bronchitis viral vs bacterial. Consider adding respiratory coverage pending culture results. 2. Multiply relapsed TTP Partial response to plasma exchange/steroids. I will continue daily plasma exchange for now.   Please note: she gets a daily IV dose of steroids with plasma exchange so I have D/Cd redundant order for PO steroids. 3. Recent candida pharyngitis Continue diflucan as long as she is on steroids. 4. COPD 5. Fluid retention from steroids Thanks  [phone]

## 2013-08-13 NOTE — Evaluation (Signed)
Physical Therapy Evaluation Patient Details Name: SHERRILYN NAIRN MRN: [id number] DOB: Apr 07, 1941 Today's Date: 08/13/2013   History of Present Illness  RUPA LAGAN is a 73 yo female with PMH of TTP, COPD, tobacco abuse, she is currently followed by Dr. Beryle Beams she is currently receiving daily plasma exchange for her third relapse of TTP. Admitted with generalized weakness.  Clinical Impression  Pt appears at baseline level of functioning, mod I with gait and mobility.  Pt educated by PT on importance of using RW vs furniture walking, ideas for safety at home.  Pt not receptive to education, states she "manages" at home with help of her husband and daughter.  Pt with no further acute PT needs at this time.    Follow Up Recommendations No PT follow up    Equipment Recommendations  None recommended by PT    Recommendations for Other Services       Precautions / Restrictions Precautions Precaution Comments: droplet Restrictions Weight Bearing Restrictions: No      Mobility  Bed Mobility Overal bed mobility: Modified Independent                Transfers Overall transfer level: Modified independent                  Ambulation/Gait Ambulation/Gait assistance: Modified independent (Device/Increase time) Ambulation Distance (Feet): 45 Feet Assistive device: None     Gait velocity interpretation: Below normal speed for age/gender General Gait Details: no LOB, pt able to gait without holding onto furniture without assist. pt reports she tends to hold furniture when she is "weak"  PT encouraged pt to use RW vs furniture walking.  Pt states she doesnt have room to maneuver RW in her home.  DIscussed safety issues at home and increased safety with RW  Stairs            Wheelchair Mobility    Modified Rankin (Stroke Patients Only)       Balance                                             Pertinent Vitals/Pain Pt able to gait  on room air with spO2 94%. No c/o pain    Home Living Family/patient expects to be discharged to:: Private residence Living Arrangements: Spouse/significant other Available Help at Discharge: Family;Available 24 hours/day Type of Home: House Home Access: Ramped entrance     Home Layout: Two level Home Equipment: Walker - 2 wheels;Cane - single point;Wheelchair - manual      Prior Function Level of Independence: Independent         Comments: pt self reports she "furniture walks".  states she does not use her RW, cane or w/c     Hand Dominance        Extremity/Trunk Assessment   Upper Extremity Assessment: Generalized weakness           Lower Extremity Assessment: Generalized weakness      Cervical / Trunk Assessment: Kyphotic  Communication   Communication: No difficulties  Cognition Arousal/Alertness: Awake/alert Behavior During Therapy: WFL for tasks assessed/performed Overall Cognitive Status: Within Functional Limits for tasks assessed                      General Comments      Exercises        Assessment/Plan  PT Assessment Patent does not need any further PT services  PT Diagnosis     PT Problem List    PT Treatment Interventions     PT Goals (Current goals can be found in the Care Plan section) Acute Rehab PT Goals PT Goal Formulation: No goals set, d/c therapy    Frequency     Barriers to discharge        Co-evaluation               End of Session   Activity Tolerance: Patient tolerated treatment well Patient left: in bed;with call bell/phone within reach Nurse Communication: Mobility status         Time: 1321-1340 PT Time Calculation (min): 19 min   Charges:   PT Evaluation $Initial PT Evaluation Tier I: 1 Procedure PT Treatments $Therapeutic Activity: 8-22 mins   PT G Codes:          Kennith Gain 08/13/2013, 1:40 PM

## 2013-08-13 NOTE — H&P (Signed)
  Date: 08/13/2013  Patient name: SHAWNTEE MAINWARING  Medical record number: [id number]  Date of birth: [birth year]   I have seen and evaluated Harriet Pho and discussed their care with the Residency Team. Ms Mackins was admitted from Dr Arnetha Courser Heme clinic or temp 102. CXR was without infiltrate. UA was nl. Localizing signs and sxs point toward a resp source or catheter line infxn. She has had two weeks of URI sxs - productive cough, sore throat, hoarseness, and fatigue. She has smoked 2 PPD since age 73 and has had PFT's at PCP but doesn't know if she has COPD. She is on inhalers. On admission, she was started on Vanc for presumed bacteremia as the URI was felt to be viral. Today, her T max is 100.2 and she feels better in that she has more energy.  She has TPP - third relapse. She is managed by Dr Lillia Corporal on steroids, plasma exchange, azathioprine.  On exam, she is able to speak in full sentences. She has good air flow but has exp wheezing R > L (R side is dependent) and course breath sounds throughout.   Assessment and Plan: I have seen and evaluated the patient as outlined above. I agree with the formulated Assessment and Plan as detailed in the residents' admission note, with the following changes:   1. Febrile illness - presumed bacteremia from catheter. Cont vanc. Follow csx and temp. Checking for resp viral pathogens. No apparent resp bacterial infxn but follow closely.   2. COPD exac - She has wheezes on ausculation B. She is on steroids for her TTP. Will hold off on ABX for now but if not improving, would start ABX for COPD exac.  3. TTP - tx as directed by Dr Lillia Corporal.   Bartholomew Crews, MD 4/14/20153:12 PM

## 2013-08-13 NOTE — Progress Notes (Signed)
Subjective: Patient reports feeling much better today, still with hoarseness and sore throat but cough has improved.  Remained afebrile overnight Tmax this am 100.2. Objective: Vital signs in last 24 hours: Filed Vitals:   08/13/13 1033 08/13/13 1040 08/13/13 1052 08/13/13 1324  BP: 138/67 152/65 130/66 153/63  Pulse: 96 90 81 95  Temp: 98.1 F (36.7 C) 98.9 F (37.2 C) 98 F (36.7 C) 99 F (37.2 C)  TempSrc: Oral Oral Oral Oral  Resp: 20 18 14 16   Height:      Weight:      SpO2:   99% 95%   Weight change:   Intake/Output Summary (Last 24 hours) at 08/13/13 1413 Last data filed at 08/13/13 0542  Gross per 24 hour  Intake      0 ml  Output   1000 ml  Net  -1000 ml   General: sitting on edge of bed eating lunch HEENT: EOMI, no scleral icterus Cardiac: RRR no murmur Pulm: CTA b/l no wheezing Abd: soft, nontender, nondistended Ext: warm and well perfused, trace pedal edema Lab Results: Basic Metabolic Panel:  Recent Labs Lab 08/12/13 1150 08/12/13 1742 08/13/13 0632  NA 143 143 145  K 4.0 4.0 4.4  CL 98 95* 103  CO2 33*  --  31  GLUCOSE 95 147* 122*  BUN 22 24* 29*  CREATININE 0.99 1.20* 0.98  CALCIUM 9.9  --  9.4   Liver Function Tests:  Recent Labs Lab 08/12/13 1150 08/13/13 0632  AST 27 18  ALT 27 17  ALKPHOS 67 56  BILITOT 0.5 0.5  PROT 6.4 5.9*  ALBUMIN 3.5 3.1*   CBC:  Recent Labs Lab 08/10/13 1100  08/12/13 1150 08/12/13 1742 08/13/13 0632  WBC 12.0*  --  8.2  --  8.3  NEUTROABS 10.9*  --  7.3  --   --   HGB 9.2*  < > 9.6* 9.5* 9.0*  HCT 28.1*  < > 31.1* 28.0* 28.4*  MCV 98.9  --  101.0*  --  99.6  PLT 108*  --  123*  --  115*  < > = values in this interval not displayed. Cardiac Enzymes: No results found for this basename: CKTOTAL, CKMB, CKMBINDEX, TROPONINI,  in the last 168 hours BNP:  Recent Labs Lab 08/12/13 1950  PROBNP 2298.0*   CBG:  Recent Labs Lab 08/13/13 0807  GLUCAP 109*   Coagulation:  Recent  Labs Lab 08/13/13 0632  LABPROT 14.6  INR 1.16   Anemia Panel:  Recent Labs Lab 08/12/13 1150  RETICCTPCT 5.56*   Urinalysis:  Recent Labs Lab 08/13/13 0530  COLORURINE YELLOW  LABSPEC 1.013  PHURINE 7.5  GLUCOSEU NEGATIVE  HGBUR NEGATIVE  BILIRUBINUR NEGATIVE  KETONESUR NEGATIVE  PROTEINUR NEGATIVE  UROBILINOGEN 0.2  NITRITE NEGATIVE  LEUKOCYTESUR NEGATIVE    Micro Results: Recent Results (from the past 240 hour(s))  CULTURE, BLOOD (SINGLE)     Status: None   Collection Time    08/12/13 11:40 AM      Result Value Ref Range Status   Preliminary Report Blood Culture received; No Growth to date;   Preliminary   Preliminary Report Culture will be held for 5 days before issuing   Preliminary   Preliminary Report a Final Negative report.   Preliminary  CULTURE, BLOOD (SINGLE)     Status: None   Collection Time    08/12/13 11:50 AM      Result Value Ref Range Status   Preliminary Report  Blood Culture received; No Growth to date;   Preliminary   Preliminary Report Culture will be held for 5 days before issuing   Preliminary   Preliminary Report a Final Negative report.   Preliminary   Studies/Results: Dg Chest 2 View  08/12/2013   CLINICAL DATA:  Cough and fever  EXAM: CHEST  2 VIEW  COMPARISON:  July 12, 2013  FINDINGS: Central catheter tip is at cavoatrial junction. No pneumothorax. There is underlying emphysematous change. There is no edema or consolidation. The heart size is normal. The pulmonary vascularity reflects underlying emphysematous change.  IMPRESSION: Underlying emphysematous change. No edema or consolidation. No pneumothorax.   Electronically Signed   By: Lowella Grip M.D.   On: 08/12/2013 12:54   Medications: I have reviewed the patient's current medications. Scheduled Meds: . azaTHIOprine  100 mg Oral Daily  . cholecalciferol  400 Units Oral Daily  . fluconazole  100 mg Oral Daily  . FLUoxetine  20 mg Oral Daily  . folic acid  1 mg Oral Daily   . gabapentin  400 mg Oral TID  . multivitamin with minerals  1 tablet Oral BID  . nicotine  21 mg Transdermal Daily  . QUEtiapine  25 mg Oral QHS  . sodium chloride  3 mL Intravenous Q12H  . tiotropium  18 mcg Inhalation Daily  . vancomycin  750 mg Intravenous Q12H  . cyanocobalamin  1,000 mcg Oral Daily   Continuous Infusions: . citrate dextrose    . citrate dextrose     PRN Meds:.acetaminophen, acetaminophen, albuterol, diphenhydrAMINE, diphenhydrAMINE, oxyCODONE-acetaminophen Assessment/Plan: Fever  - Tmax since admission 100.2.  Patient with s/s consistent with URI.  On empiric antibiotics given possible catheter associated infection. - IV Vancomycin  - Blood Cultures x2. NGTD - Monitor Fever  - Respiratory Virus panel pending - Repeat AM CBC -Will hold off on respiratory abx at this time.  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 She has had a total of 18 plasma exchanges, and 4 doses of Rituxan, and was started on azithroprine 100mg  daily one week ago.  - Continue daily plasma exchange per Dr. Azucena Freed orders  - Continue solumedrol prior to plasma exchange - Continue Azithroprine 100mg  daily   COPD  - Continue Spirivia daily  -Albuterol neb q6prn   Oral Candidiasis  -Likely secondary to steroids  - Continue Diflucan daily while on steroids  Tobacco Abuse  -Smokes 2.5 ppd  - Nicotine patch while inpatient.  -Encourage smoking cessation   DVT PPx: SCDs (given TTP)  Diet: Regular  Code Status: Full Dispo: Disposition is deferred at this time, awaiting improvement of current medical problems.  Possible discharge tomorrow  The patient does have a current PCP Kirk Ruths, MD) and does not need an [facility] hospital follow-up appointment after discharge.  The patient does not have transportation limitations that hinder transportation to clinic appointments.  .Services Needed at time of discharge: Y =  Yes, Blank = No PT:   OT:   RN:   Equipment:   Other:     LOS: 1 day   Joni Reining, DO 08/13/2013, 2:13 PM

## 2013-08-14 LAB — THERAPEUTIC PLASMA EXCHANGE (BLOOD BANK)
Plasma volume needed: 2600
UNIT DIVISION: 0
UNIT DIVISION: 0
UNIT DIVISION: 0
Unit division: 0
Unit division: 0
Unit division: 0
Unit division: 0
Unit division: 0
Unit division: 0
Unit division: 0
Unit division: 0

## 2013-08-14 LAB — RESPIRATORY VIRUS PANEL
ADENOVIRUS: NOT DETECTED
INFLUENZA A H3: NOT DETECTED
Influenza A H1: NOT DETECTED
Influenza A: NOT DETECTED
Influenza B: NOT DETECTED
Metapneumovirus: NOT DETECTED
PARAINFLUENZA 1 A: NOT DETECTED
Parainfluenza 2: NOT DETECTED
Parainfluenza 3: NOT DETECTED
RESPIRATORY SYNCYTIAL VIRUS B: NOT DETECTED
Respiratory Syncytial Virus A: NOT DETECTED
Rhinovirus: NOT DETECTED

## 2013-08-14 LAB — CBC
HEMATOCRIT: 26.7 % — AB (ref 36.0–46.0)
HEMOGLOBIN: 8.6 g/dL — AB (ref 12.0–15.0)
MCH: 32 pg (ref 26.0–34.0)
MCHC: 32.2 g/dL (ref 30.0–36.0)
MCV: 99.3 fL (ref 78.0–100.0)
Platelets: 115 10*3/uL — ABNORMAL LOW (ref 150–400)
RBC: 2.69 MIL/uL — ABNORMAL LOW (ref 3.87–5.11)
RDW: 14.8 % (ref 11.5–15.5)
WBC: 6.1 10*3/uL (ref 4.0–10.5)

## 2013-08-14 LAB — COMPREHENSIVE METABOLIC PANEL
ALBUMIN: 2.9 g/dL — AB (ref 3.5–5.2)
ALT: 19 U/L (ref 0–35)
AST: 20 U/L (ref 0–37)
Alkaline Phosphatase: 53 U/L (ref 39–117)
BUN: 28 mg/dL — AB (ref 6–23)
CO2: 31 mEq/L (ref 19–32)
CREATININE: 0.91 mg/dL (ref 0.50–1.10)
Calcium: 8.7 mg/dL (ref 8.4–10.5)
Chloride: 101 mEq/L (ref 96–112)
GFR calc Af Amer: 72 mL/min — ABNORMAL LOW (ref >90)
GFR, EST NON AFRICAN AMERICAN: 62 mL/min — AB (ref >90)
Glucose, Bld: 126 mg/dL — ABNORMAL HIGH (ref 70–99)
Potassium: 3.2 mEq/L — ABNORMAL LOW (ref 3.7–5.3)
Sodium: 144 mEq/L (ref 137–147)
Total Bilirubin: 0.4 mg/dL (ref 0.3–1.2)
Total Protein: 5.7 g/dL — ABNORMAL LOW (ref 6.0–8.3)

## 2013-08-14 LAB — GLUCOSE, CAPILLARY: Glucose-Capillary: 86 mg/dL (ref 70–99)

## 2013-08-14 LAB — LACTATE DEHYDROGENASE: LDH: 241 U/L (ref 94–250)

## 2013-08-14 MED ORDER — DIPHENHYDRAMINE HCL 25 MG PO CAPS
ORAL_CAPSULE | ORAL | Status: AC
  Filled 2013-08-14: qty 1

## 2013-08-14 MED ORDER — CALCIUM CARBONATE ANTACID 500 MG PO CHEW
2.0000 | CHEWABLE_TABLET | ORAL | Status: AC
  Administered 2013-08-14 (×2): 400 mg via ORAL
  Filled 2013-08-14 (×2): qty 2

## 2013-08-14 MED ORDER — CEPASTAT 14.5 MG MT LOZG: 1.0000 | LOZENGE | OROMUCOSAL | Status: DC | PRN

## 2013-08-14 MED ORDER — METHYLPREDNISOLONE SODIUM SUCC 125 MG IJ SOLR
INTRAMUSCULAR | Status: AC
  Administered 2013-08-14: 60 mg via INTRAVENOUS
  Filled 2013-08-14: qty 2

## 2013-08-14 MED ORDER — ACETAMINOPHEN 325 MG PO TABS
650.0000 mg | ORAL_TABLET | ORAL | Status: DC | PRN
  Administered 2013-08-14: 650 mg via ORAL

## 2013-08-14 MED ORDER — PREDNISONE 20 MG PO TABS: 40.0000 mg | ORAL_TABLET | Freq: Every day | ORAL | Status: DC

## 2013-08-14 MED ORDER — ACD FORMULA A 0.73-2.45-2.2 GM/100ML VI SOLN
500.0000 mL | Status: DC
  Filled 2013-08-14: qty 500

## 2013-08-14 MED ORDER — CALCIUM GLUCONATE 10 % IV SOLN
4.0000 g | Freq: Once | INTRAVENOUS | Status: AC
  Administered 2013-08-14: 4 g via INTRAVENOUS
  Filled 2013-08-14: qty 40

## 2013-08-14 MED ORDER — DIPHENHYDRAMINE HCL 25 MG PO CAPS
25.0000 mg | ORAL_CAPSULE | Freq: Four times a day (QID) | ORAL | Status: DC | PRN
  Administered 2013-08-14: 25 mg via ORAL

## 2013-08-14 MED ORDER — ANTICOAGULANT SODIUM CITRATE 4% (200MG/5ML) IV SOLN
5.0000 mL | Freq: Once | Status: AC
  Administered 2013-08-14: 5 mL
  Filled 2013-08-14: qty 250

## 2013-08-14 MED ORDER — ACETAMINOPHEN 325 MG PO TABS
ORAL_TABLET | ORAL | Status: AC
  Filled 2013-08-14: qty 2

## 2013-08-14 MED ORDER — CALCIUM CARBONATE ANTACID 500 MG PO CHEW
CHEWABLE_TABLET | ORAL | Status: AC
  Administered 2013-08-14: 400 mg via ORAL
  Filled 2013-08-14: qty 4

## 2013-08-14 MED ORDER — METHYLPREDNISOLONE SODIUM SUCC 125 MG IJ SOLR
60.0000 mg | Freq: Once | INTRAMUSCULAR | Status: AC
  Administered 2013-08-14: 60 mg via INTRAVENOUS
  Filled 2013-08-14: qty 0.96

## 2013-08-14 NOTE — Progress Notes (Signed)
Pt given discharge instructions.  PIV removed.  Pt taken to drop off location via wheelchair.

## 2013-08-14 NOTE — Progress Notes (Signed)
  Date: 08/14/2013  Patient name: [name]  Medical record number: [id number]  Date of birth: 1941/03/05   This patient has been seen and the plan of care was discussed with the house staff. Please see their note for complete details. I concur with their findings with the following additions/corrections: [name] cont to feel better but cont to have sore throat and hoarseness. Her lungs are much better today - good air flow and much less wheezing.  Blood cx x 2 prelim are negative. Plan is to D/C home with viral URI as cause of fever and sxs.   Bartholomew Crews, MD 08/14/2013, 2:22 PM

## 2013-08-14 NOTE — Progress Notes (Addendum)
Patient ID: SHAQUINA GILLHAM, female   DOB: [birth year], 73 y.o.   MRN: [id number] Doing much better.  Afebrile. Cough resolving. Cultures negative to date. Inspection of vascular cath site by dialysis RN reported to me as looking good with no erythema or exudate. Lungs now clear Pharynx no exudate Extremities: resolved peripheral edema Lab: plateau in platllet count @ 115,000, slowly falling hemoglobin but stable, persistently mildly elevated but stable LDH Discussed with Dr Bryna Colander, pending review by inpatient attending MD, she could be discharged today following her scheduled plasma exchange. I am going to start a steroid taper:  60 mg IV on day of exchange, 40 mg PO on days she does not get exchange. Continue Imuran & diflucan. Change plasma exchange to QOD and continue as outpatient. Follow up with me Monday 4/20 in [facility] @ 9:45 Thanks!

## 2013-08-14 NOTE — Discharge Summary (Signed)
Name: [name] MRN: [id number] DOB: 1940/10/02 73 y.o. PCP: Kirk Ruths, MD  Date of Admission: 08/12/2013  2:27 PM Date of Discharge: 08/14/2013 Attending Physician: Bartholomew Crews, MD  Discharge Diagnosis: Principal Problem:   Fever Active Problems:   COPD (chronic obstructive pulmonary disease)   Tobacco abuse   TTP (thrombotic thrombocytopenic purpura)   Oral thrush   Bronchitis, chronic obstructive w acute bronchitis  Discharge Medications:   Medication List         azaTHIOprine 50 MG tablet  Commonly known as:  IMURAN  Take 100 mg by mouth daily.     cholecalciferol 400 UNITS Tabs tablet  Commonly known as:  VITAMIN D  Take 400 Units by mouth.     cyanocobalamin 100 MCG tablet  Take 1,000 mcg by mouth daily.     fluconazole 100 MG tablet  Commonly known as:  DIFLUCAN  Take 100 mg by mouth daily.     FLUoxetine 20 MG capsule  Commonly known as:  PROZAC  Take 20 mg by mouth daily.     folic acid 1 MG tablet  Commonly known as:  FOLVITE  Take 1 mg by mouth daily.     gabapentin 400 MG capsule  Commonly known as:  NEURONTIN  Take 400 mg by mouth 3 (three) times daily.     multivitamin tablet  Take 1 tablet by mouth 2 (two) times daily.     nicotine 21 mg/24hr patch  Commonly known as:  NICODERM CQ - dosed in mg/24 hours  Place 1 patch (21 mg total) onto the skin daily.     oxyCODONE-acetaminophen 5-325 MG per tablet  Commonly known as:  PERCOCET/ROXICET  Take 2 tablets by mouth every 6 (six) hours as needed for moderate pain or severe pain.     phenol-menthol 14.5 MG lozenge  Place 1 lozenge inside cheek as needed for sore throat.     predniSONE 20 MG tablet  Commonly known as:  DELTASONE  Take 2 tablets (40 mg total) by mouth daily with breakfast. Only on days not receiving Plasma exchange.     QUEtiapine 25 MG tablet  Commonly known as:  SEROQUEL  Take 25 mg by mouth at bedtime.     tiotropium 18 MCG inhalation capsule   Commonly known as:  SPIRIVA  Place 18 mcg into inhaler and inhale daily.     traMADol 50 MG tablet  Commonly known as:  ULTRAM  Take 50-100 mg by mouth every 6 (six) hours as needed for moderate pain.        Disposition and follow-up:   [name] was discharged from [facility] in Stable condition.  At the hospital follow up visit please address:  1.  Resolution of URI symptoms (sore throat, cough), Fever.  2.  Labs / imaging needed at time of follow-up: none  3.  Pending labs/ test needing follow-up: Blood Cultures (08/12/13)  Follow-up Appointments:     Follow-up Information   Follow up with Annia Belt, MD On 08/19/2013. (at 9:45 am)    Specialty:  Oncology   Contact information:   [address] [place] [address] [phone]       Follow up with Kirk Ruths., MD. (As needed)    Specialty:  Internal Medicine   Contact information:   [facility] [place] [address] [phone]       Discharge Instructions: Discharge Orders   Future Appointments Provider  Department Dept Phone   08/19/2013 9:45 AM Annia Belt, MD [facility] [place] [phone]   09/03/2013 10:00 AM [facility] [place] [facility] [phone]   10/01/2013 10:00 AM [facility] [phone]   11/05/2013 10:00 AM [facility] Only [facility] [phone]   Future Orders Complete By Expires   Diet - low sodium heart healthy  As directed    Discharge instructions  As directed    Increase activity slowly  As directed       Consultations: Treatment Team:  Annia Belt, MD  Procedures Performed:  Dg Chest 2 View  08/12/2013   CLINICAL DATA:  Cough and fever  EXAM: CHEST  2 VIEW  COMPARISON:  July 12, 2013  FINDINGS: Central catheter tip is at cavoatrial junction. No pneumothorax. There is  underlying emphysematous change. There is no edema or consolidation. The heart size is normal. The pulmonary vascularity reflects underlying emphysematous change.  IMPRESSION: Underlying emphysematous change. No edema or consolidation. No pneumothorax.   Electronically Signed   By: Lowella Grip M.D.   On: 08/12/2013 12:54   Admission HPI: [name] is a 73 yo female with PMH of TTP, COPD, tobacco abuse, she is currently followed by Dr. Beryle Beams she is currently receiving daily plasma exchange for her third relapse of TTP. Of note 2 years ago while receiving treatment for her 2nd relapse she developed enterococcal sepsis, endocarditis, and an epidural abscess.  Today she presented to the [facility] for a hematology clinic follow up with Dr. Waymon Budge. She is accompanied by her daughter. They report that she has had come congestion and "cold" symptoms for about 2 weeks. They note that she has had a productive cough with white sputum. In addition she has had hoarseness and sore throat for the past 3 days. They note that [name] husband has been sick for about the past 2 weeks with a URI for which he has taken a Z-pack. In addition to these symptoms they note [name] has had increased lower extremity edema for the past few days, they note that this occasionally happens when she takes steroids but this it may be a little worse today. She denies SOB or orthopnea. She denies any chest pain or pain around the catheter site, she reports that this does not feel like her previous catheter infection.   Hospital Course by problem list:   Fever Patient presented to her Hematologist appointment with a fever of 102.  She also complained of upper respiratory symptoms of cough, and sore throat.  She noted that her husband had been sick recently with similar symptoms.  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 Given her immune suppression she was admitted to the hospital for empiric IV antibiotics and close observation.  She was started on IV vancomycin although her catheter site appeared clear.  A chest Xray showed no infiltrate.  After two days her blood cultures had not grown any bacteria and she was afebrile and felt much better.  After discussion with Dr. Beryle Beams (Hematology) her vancomycin was discontinued and she was discharged home to follow up with Dr. Beryle Beams and her PCP.    COPD (chronic obstructive pulmonary disease) -Patient was noted to have wheezing on exam.  She was  treated with PRN albuterol nebulizer and her home spiriva was continued.  She is getting steroids as part of her TTP treatment.    Tobacco abuse -In pre contemplation stage.  Was treated with nicotine patch while inpatient.    TTP (thrombotic thrombocytopenic purpura) - She was continued on daily plasma exchange and IV steroids as well as azathioprine per Dr. Beryle Beams.  Her platelet count remained stable at 115 while in hospital and at discharge Dr. Beryle Beams recommended her to continue prednisone 40mg  on non plasma exchange days and to start every other day plasma exchange.  She will continue azathioprine .   Oral thrush  -Noted by Dr. Beryle Beams on previous office visit.  Due to immune suppression from steroids.  Continued on fluconazole daily until steroids completed.  Discharge Vitals:   BP 150/70  Pulse 91  Temp(Src) 99.5 F (37.5 C) (Oral)  Resp 16  Ht 5\' 4"  (1.626 m)  Wt 174 lb 2.6 oz (79 kg)  BMI 29.88 kg/m2  SpO2 93%  Discharge Labs:  Results for orders placed during the hospital encounter of 08/12/13 (from the past 24 hour(s))  CBC     Status: Abnormal   Collection Time    08/14/13  6:05 AM      Result Value Ref Range   WBC 6.1  4.0 - 10.5 K/uL   RBC 2.69 (*) 3.87 - 5.11 MIL/uL   Hemoglobin 8.6 (*) 12.0 - 15.0 g/dL   HCT 26.7 (*)  36.0 - 46.0 %   MCV 99.3  78.0 - 100.0 fL   MCH 32.0  26.0 - 34.0 pg   MCHC 32.2  30.0 - 36.0 g/dL   RDW 14.8  11.5 - 15.5 %   Platelets 115 (*) 150 - 400 K/uL  GLUCOSE, CAPILLARY     Status: None   Collection Time    08/14/13  7:47 AM      Result Value Ref Range   Glucose-Capillary 86  70 - 99 mg/dL  THERAPEUTIC PLASMA EXCHANGE (BLOOD BANK)     Status: None   Collection Time    08/14/13 10:55 AM      Result Value Ref Range   Plasma Exchange 2605 ML THAWED     Plasma volume needed 2600     Unit Number [id number]     Blood Component Type THAWED PLASMA     Unit division 00     Status of Unit ISSUED     Transfusion Status OK TO TRANSFUSE     Unit Number [id number]     Blood Component Type THAWED PLASMA     Unit division 00     Status of Unit ISSUED     Transfusion Status OK TO TRANSFUSE     Unit Number [id number]     Blood Component Type THAWED PLASMA     Unit division 00     Status of Unit ISSUED     Transfusion Status OK TO TRANSFUSE     Unit Number [id number]     Blood Component Type THAWED PLASMA     Unit division 00     Status of Unit ISSUED     Transfusion Status OK TO TRANSFUSE     Unit Number [id number]     Blood Component Type THAWED PLASMA     Unit division 00     Status of Unit ISSUED     Transfusion Status OK TO TRANSFUSE     Unit Number [id number]     Blood Component  Type THAWED PLASMA     Unit division 00     Status of Unit ISSUED     Transfusion Status OK TO TRANSFUSE     Unit Number [id number]     Blood Component Type THAWED PLASMA     Unit division 00     Status of Unit ISSUED     Transfusion Status OK TO TRANSFUSE     Unit Number [id number]     Blood Component Type THAWED PLASMA     Unit division 00     Status of Unit ISSUED     Transfusion Status OK TO TRANSFUSE     Unit Number [id number]     Blood Component Type THAWED PLASMA     Unit division 00     Status of Unit ISSUED     Transfusion Status OK TO TRANSFUSE       Unit Number [id number]     Blood Component Type THAWED PLASMA     Unit division 00     Status of Unit ISSUED     Transfusion Status OK TO TRANSFUSE     Unit Number [id number]     Blood Component Type THAWED PLASMA     Unit division 00     Status of Unit ISSUED     Transfusion Status OK TO TRANSFUSE    COMPREHENSIVE METABOLIC PANEL     Status: Abnormal   Collection Time    08/14/13 12:00 PM      Result Value Ref Range   Sodium 144  137 - 147 mEq/L   Potassium 3.2 (*) 3.7 - 5.3 mEq/L   Chloride 101  96 - 112 mEq/L   CO2 31  19 - 32 mEq/L   Glucose, Bld 126 (*) 70 - 99 mg/dL   BUN 28 (*) 6 - 23 mg/dL   Creatinine, Ser 0.91  0.50 - 1.10 mg/dL   Calcium 8.7  8.4 - 10.5 mg/dL   Total Protein 5.7 (*) 6.0 - 8.3 g/dL   Albumin 2.9 (*) 3.5 - 5.2 g/dL   AST 20  0 - 37 U/L   ALT 19  0 - 35 U/L   Alkaline Phosphatase 53  39 - 117 U/L   Total Bilirubin 0.4  0.3 - 1.2 mg/dL   GFR calc non Af Amer 62 (*) >90 mL/min   GFR calc Af Amer 72 (*) >90 mL/min  LACTATE DEHYDROGENASE     Status: None   Collection Time    08/14/13 12:00 PM      Result Value Ref Range   LDH 241  94 - 250 U/L    Signed: Joni Reining, DO 08/14/2013, 2:38 PM   Time Spent on Discharge: 35 minutes Services Ordered on Discharge: none Equipment Ordered on Discharge: none

## 2013-08-14 NOTE — Progress Notes (Signed)
Subjective: Patient continues to feel very well. Reports her hoarseness and sore throat is still bothersome but overall feeling better.  She was afebrile overnight however had a low grade temperature of 100.2 at plasma exchange this afternoon.    Objective: Vital signs in last 24 hours: Filed Vitals:   08/14/13 1244 08/14/13 1250 08/14/13 1258 08/14/13 1312  BP: 149/64 139/53 139/55 150/70  Pulse: 97 93 92 91  Temp: 100.9 F (38.3 C) 101 F (38.3 C) 101.1 F (38.4 C) 99.5 F (37.5 C)  TempSrc: Oral Oral Oral Oral  Resp: 16 17 16 16   Height:      Weight:      SpO2: 92% 93% 92% 93%   Weight change: -8 lb 2.5 oz (-3.7 kg)  Intake/Output Summary (Last 24 hours) at 08/14/13 1423 Last data filed at 08/14/13 0942  Gross per 24 hour  Intake    240 ml  Output      0 ml  Net    240 ml   General: resting comfortably on edge of bed HEENT: EOMI, no scleral icterus Cardiac: RRR no murmur Pulm: CTA b/l no wheezing Abd: soft, nontender, nondistended Ext: warm and well perfused, trace pedal edema (improved) Lab Results: Basic Metabolic Panel:  Recent Labs Lab 08/13/13 0632 08/14/13 1200  NA 145 144  K 4.4 3.2*  CL 103 101  CO2 31 31  GLUCOSE 122* 126*  BUN 29* 28*  CREATININE 0.98 0.91  CALCIUM 9.4 8.7   Liver Function Tests:  Recent Labs Lab 08/13/13 0632 08/14/13 1200  AST 18 20  ALT 17 19  ALKPHOS 56 53  BILITOT 0.5 0.4  PROT 5.9* 5.7*  ALBUMIN 3.1* 2.9*   CBC:  Recent Labs Lab 08/10/13 1100  08/12/13 1150  08/13/13 0632 08/14/13 0605  WBC 12.0*  --  8.2  --  8.3 6.1  NEUTROABS 10.9*  --  7.3  --   --   --   HGB 9.2*  < > 9.6*  < > 9.0* 8.6*  HCT 28.1*  < > 31.1*  < > 28.4* 26.7*  MCV 98.9  --  101.0*  --  99.6 99.3  PLT 108*  --  123*  --  115* 115*  < > = values in this interval not displayed. Cardiac Enzymes: No results found for this basename: CKTOTAL, CKMB, CKMBINDEX, TROPONINI,  in the last 168 hours BNP:  Recent Labs Lab 08/12/13 1950    PROBNP 2298.0*   CBG:  Recent Labs Lab 08/13/13 0807 08/14/13 0747  GLUCAP 109* 86   Coagulation:  Recent Labs Lab 08/13/13 0632  LABPROT 14.6  INR 1.16   Anemia Panel:  Recent Labs Lab 08/12/13 1150  RETICCTPCT 5.56*   Urinalysis:  Recent Labs Lab 08/13/13 0530  COLORURINE YELLOW  LABSPEC 1.013  PHURINE 7.5  GLUCOSEU NEGATIVE  HGBUR NEGATIVE  BILIRUBINUR NEGATIVE  KETONESUR NEGATIVE  PROTEINUR NEGATIVE  UROBILINOGEN 0.2  NITRITE NEGATIVE  LEUKOCYTESUR NEGATIVE    Micro Results: Recent Results (from the past 240 hour(s))  CULTURE, BLOOD (SINGLE)     Status: None   Collection Time    08/12/13 11:40 AM      Result Value Ref Range Status   Preliminary Report Blood Culture received; No Growth to date;   Preliminary   Preliminary Report Culture will be held for 5 days before issuing   Preliminary   Preliminary Report a Final Negative report.   Preliminary  CULTURE, BLOOD (SINGLE)  Status: None   Collection Time    08/12/13 11:50 AM      Result Value Ref Range Status   Preliminary Report Blood Culture received; No Growth to date;   Preliminary   Preliminary Report Culture will be held for 5 days before issuing   Preliminary   Preliminary Report a Final Negative report.   Preliminary   Studies/Results: No results found. Medications: I have reviewed the patient's current medications. Scheduled Meds: . acetaminophen      . azaTHIOprine  100 mg Oral Daily  . calcium carbonate  2 tablet Oral Q3H  . calcium gluconate IVPB  4 g Intravenous Once  . cholecalciferol  400 Units Oral Daily  . diphenhydrAMINE      . fluconazole  100 mg Oral Daily  . FLUoxetine  20 mg Oral Daily  . folic acid  1 mg Oral Daily  . gabapentin  400 mg Oral TID  . multivitamin with minerals  1 tablet Oral BID  . nicotine  21 mg Transdermal Daily  . QUEtiapine  25 mg Oral QHS  . sodium chloride  3 mL Intravenous Q12H  . tiotropium  18 mcg Inhalation Daily  . vancomycin  750  mg Intravenous Q12H  . cyanocobalamin  1,000 mcg Oral Daily   Continuous Infusions: . citrate dextrose     PRN Meds:.acetaminophen, albuterol, diphenhydrAMINE, oxyCODONE-acetaminophen, phenol-menthol Assessment/Plan: Fever  - Tmax since admission 100.2.  Patient with s/s consistent with URI.  Patient was on empiric coverage with vancomycin however blood cultures are NGTD. - Discussed care with Dr. Beryle Beams, Vancomycin can be discontinued, no indication for oral antibiotics at this time.  -Patient can be discharged home.  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  -Platelet count stable. -  plasma exchange today per Dr. Azucena Freed orders  - Will skip exchange tomorrow and resume as outpatient on Friday and continue every other day - Continue solumedrol on plasma exchange days, prednisone 40mg  on days without exchange. - Continue Azithroprine 100mg  daily   COPD  - Continue Spirivia daily  -Albuterol neb q6prn  - F/U with PCP  Oral Candidiasis  -Likely secondary to steroids  - Continue Diflucan daily while on steroids  Tobacco Abuse  -Smokes 2.5 ppd  - Nicotine patch while inpatient.  -Encourage smoking cessation   DVT PPx: SCDs (given TTP)  Diet: Regular  Code Status: Full Dispo: Discharge home today.  The patient does have a current PCP Kirk Ruths, MD) and does not need an [facility] hospital follow-up appointment after discharge.  The patient does not have transportation limitations that hinder transportation to clinic appointments.  .Services Needed at time of discharge: Y = Yes, Blank = No PT:   OT:   RN:   Equipment:   Other:     LOS: 2 days   Joni Reining, DO 08/14/2013, 2:23 PM

## 2013-08-14 NOTE — Progress Notes (Signed)
TPE- Dr. Beryle Beams paged. Pt has fever 100.2-101.5. Tylenol given. Ok to continue with exchange today, skip tomorrow and run again Friday per Dr. Beryle Beams. Continue to monitor patient. Confirmed time with patient for Friday at 1130 am.

## 2013-08-15 LAB — THERAPEUTIC PLASMA EXCHANGE (BLOOD BANK)
PLASMA VOLUME NEEDED: 2600
UNIT DIVISION: 0
UNIT DIVISION: 0
UNIT DIVISION: 0
UNIT DIVISION: 0
Unit division: 0
Unit division: 0
Unit division: 0
Unit division: 0
Unit division: 0
Unit division: 0
Unit division: 0

## 2013-08-15 LAB — POCT I-STAT, CHEM 8
BUN: 27 mg/dL — ABNORMAL HIGH (ref 6–23)
CHLORIDE: 95 meq/L — AB (ref 96–112)
Calcium, Ion: 1.1 mmol/L — ABNORMAL LOW (ref 1.13–1.30)
Creatinine, Ser: 1.2 mg/dL — ABNORMAL HIGH (ref 0.50–1.10)
Glucose, Bld: 129 mg/dL — ABNORMAL HIGH (ref 70–99)
HCT: 27 % — ABNORMAL LOW (ref 36.0–46.0)
HEMOGLOBIN: 9.2 g/dL — AB (ref 12.0–15.0)
POTASSIUM: 3.1 meq/L — AB (ref 3.7–5.3)
Sodium: 144 mEq/L (ref 137–147)
TCO2: 33 mmol/L (ref 0–100)

## 2013-08-15 NOTE — Care Management Note (Signed)
    Page 1 of 1   08/15/2013     8:28:38 AM   CARE MANAGEMENT NOTE 08/15/2013  Patient:  [name], [name]   Account Number:  [id number]  Date Initiated:  08/14/2013  Documentation initiated by:  Tomi Bamberger  Subjective/Objective Assessment:   dx fever , ttp  admit- lives with spouse.     Action/Plan:   Anticipated DC Date:  08/14/2013   Anticipated DC Plan:  [place]  CM consult      Choice offered to / List presented to:             Status of service:  Completed, signed off Medicare Important Message given?   (If response is "NO", the following Medicare IM given date fields will be blank) Date Medicare IM given:   Date Additional Medicare IM given:    Discharge Disposition:  HOME/SELF CARE  Per UR Regulation:  Reviewed for med. necessity/level of care/duration of stay  If discussed at [place] of Stay Meetings, dates discussed:    Comments:

## 2013-08-16 ENCOUNTER — Encounter: Payer: Self-pay | Admitting: Oncology

## 2013-08-16 ENCOUNTER — Other Ambulatory Visit: Payer: Self-pay | Admitting: Oncology

## 2013-08-16 ENCOUNTER — Non-Acute Institutional Stay (HOSPITAL_COMMUNITY)
Admission: AD | Admit: 2013-08-16 | Discharge: 2013-08-16 | Disposition: A | Discharge status: Home / Self Care | Payer: Medicare Other | Source: Ambulatory Visit | Attending: Oncology | Admitting: Oncology

## 2013-08-16 DIAGNOSIS — R5383 Other fatigue: Secondary | ICD-10-CM | POA: Diagnosis not present

## 2013-08-16 DIAGNOSIS — R5381 Other malaise: Secondary | ICD-10-CM | POA: Diagnosis not present

## 2013-08-16 DIAGNOSIS — M311 Thrombotic microangiopathy, unspecified: Secondary | ICD-10-CM | POA: Insufficient documentation

## 2013-08-16 DIAGNOSIS — A419 Sepsis, unspecified organism: Secondary | ICD-10-CM | POA: Diagnosis not present

## 2013-08-16 DIAGNOSIS — M3119 Other thrombotic microangiopathy: Secondary | ICD-10-CM

## 2013-08-16 LAB — COMPREHENSIVE METABOLIC PANEL
ALT: 25 U/L (ref 0–35)
AST: 23 U/L (ref 0–37)
Albumin: 3.1 g/dL — ABNORMAL LOW (ref 3.5–5.2)
Alkaline Phosphatase: 65 U/L (ref 39–117)
BUN: 31 mg/dL — ABNORMAL HIGH (ref 6–23)
CHLORIDE: 103 meq/L (ref 96–112)
CO2: 25 meq/L (ref 19–32)
CREATININE: 0.8 mg/dL (ref 0.50–1.10)
Calcium: 8.5 mg/dL (ref 8.4–10.5)
GFR calc Af Amer: 84 mL/min — ABNORMAL LOW (ref >90)
GFR calc non Af Amer: 72 mL/min — ABNORMAL LOW (ref >90)
Glucose, Bld: 133 mg/dL — ABNORMAL HIGH (ref 70–99)
Potassium: 3.8 mEq/L (ref 3.7–5.3)
Sodium: 141 mEq/L (ref 137–147)
Total Bilirubin: 0.3 mg/dL (ref 0.3–1.2)
Total Protein: 6.1 g/dL (ref 6.0–8.3)

## 2013-08-16 LAB — CBC WITH DIFFERENTIAL/PLATELET
BASOS ABS: 0 10*3/uL (ref 0.0–0.1)
BASOS PCT: 0 % (ref 0–1)
Eosinophils Absolute: 0 10*3/uL (ref 0.0–0.7)
Eosinophils Relative: 0 % (ref 0–5)
HEMATOCRIT: 26.4 % — AB (ref 36.0–46.0)
Hemoglobin: 8.6 g/dL — ABNORMAL LOW (ref 12.0–15.0)
LYMPHS PCT: 4 % — AB (ref 12–46)
Lymphs Abs: 0.2 10*3/uL — ABNORMAL LOW (ref 0.7–4.0)
MCH: 32 pg (ref 26.0–34.0)
MCHC: 32.6 g/dL (ref 30.0–36.0)
MCV: 98.1 fL (ref 78.0–100.0)
MONO ABS: 0.4 10*3/uL (ref 0.1–1.0)
Monocytes Relative: 6 % (ref 3–12)
NEUTROS ABS: 5.7 10*3/uL (ref 1.7–7.7)
NEUTROS PCT: 90 % — AB (ref 43–77)
Platelets: 130 10*3/uL — ABNORMAL LOW (ref 150–400)
RBC: 2.69 MIL/uL — ABNORMAL LOW (ref 3.87–5.11)
RDW: 14.6 % (ref 11.5–15.5)
WBC: 6.3 10*3/uL (ref 4.0–10.5)

## 2013-08-16 LAB — RETICULOCYTES
RBC.: 2.69 MIL/uL — AB (ref 3.87–5.11)
RETIC CT PCT: 4.3 % — AB (ref 0.4–3.1)
Retic Count, Absolute: 115.7 10*3/uL (ref 19.0–186.0)

## 2013-08-16 LAB — LACTATE DEHYDROGENASE: LDH: 292 U/L — ABNORMAL HIGH (ref 94–250)

## 2013-08-16 MED ORDER — DIPHENHYDRAMINE HCL 25 MG PO CAPS
25.0000 mg | ORAL_CAPSULE | Freq: Four times a day (QID) | ORAL | Status: DC | PRN
  Administered 2013-08-16: 25 mg via ORAL

## 2013-08-16 MED ORDER — DIPHENHYDRAMINE HCL 25 MG PO CAPS
ORAL_CAPSULE | ORAL | Status: AC
Start: 2013-08-16 — End: 2013-08-16
  Administered 2013-08-16: 25 mg via ORAL
  Filled 2013-08-16: qty 1

## 2013-08-16 MED ORDER — ACD FORMULA A 0.73-2.45-2.2 GM/100ML VI SOLN
Status: AC
Start: 2013-08-16 — End: 2013-08-16
  Administered 2013-08-16: 500 mL via INTRAVENOUS
  Filled 2013-08-16: qty 500

## 2013-08-16 MED ORDER — CALCIUM CARBONATE ANTACID 500 MG PO CHEW
2.0000 | CHEWABLE_TABLET | ORAL | Status: DC
  Administered 2013-08-16: 400 mg via ORAL

## 2013-08-16 MED ORDER — SODIUM CHLORIDE 0.9 % IV SOLN
4.0000 g | Freq: Once | INTRAVENOUS | Status: AC
  Administered 2013-08-16: 4 g via INTRAVENOUS
  Filled 2013-08-16: qty 40

## 2013-08-16 MED ORDER — ACETAMINOPHEN 325 MG PO TABS: 650.0000 mg | ORAL_TABLET | ORAL | Status: DC | PRN

## 2013-08-16 MED ORDER — METHYLPREDNISOLONE SODIUM SUCC 125 MG IJ SOLR
INTRAMUSCULAR | Status: AC
  Administered 2013-08-16: 60 mg via INTRAVENOUS
  Filled 2013-08-16: qty 2

## 2013-08-16 MED ORDER — CALCIUM CARBONATE ANTACID 500 MG PO CHEW
CHEWABLE_TABLET | ORAL | Status: AC
  Administered 2013-08-16: 400 mg via ORAL
  Filled 2013-08-16: qty 2

## 2013-08-16 MED ORDER — ACD FORMULA A 0.73-2.45-2.2 GM/100ML VI SOLN
500.0000 mL | Status: DC
  Administered 2013-08-16: 500 mL via INTRAVENOUS

## 2013-08-16 MED ORDER — METHYLPREDNISOLONE SODIUM SUCC 125 MG IJ SOLR
60.0000 mg | Freq: Once | INTRAMUSCULAR | Status: AC
  Administered 2013-08-16: 60 mg via INTRAVENOUS

## 2013-08-16 MED ORDER — ANTICOAGULANT SODIUM CITRATE 4% (200MG/5ML) IV SOLN
5.0000 mL | Freq: Once | Status: AC
  Administered 2013-08-16: 5 mL
  Filled 2013-08-16: qty 250

## 2013-08-16 NOTE — Progress Notes (Unsigned)
Patient ID: CHIKITA DOGAN, female   DOB: 1941/03/30, 73 y.o.   MRN: [id number] Patient seen in dialysis unit prior to starting plasma exchange.  Just discharged 4/15 after 72 hour stay for fever, cough. No infiltrate on CXR. New Peripheral edema - likely steroid related. No temp since D/C.  Cough much improved. Edema resolved. No new complaints. PE: BP 142/70 P 91 reg Resp 16 T 98.8 Cushingoid Lungs now clear Heart regular Vascular cath L subclavian Extrem: resolved edema Lab: Platelets up today from 115,000 to 130,000 Imp: #. Multiply relapsed TTP Stable to proceed with plasma exchange today. I will do a plasma exchange tomorrow then go to Mon-Wed-Fri schedule.  #2.  Acute bronchitis in immunocompromised host on steroids with underlying COPD Likely viral etiology but respiratory virus panel negative; blood cultures remain negative

## 2013-08-16 NOTE — Progress Notes (Signed)
TPE- Tolerating well. Afebrile today. States feels "much better". Dr. Beryle Beams came to see patient on unit. Will do exchange tomorrow 4/18, skip Sunday, and resume MWF schedule next week. Confirmed with pt. Appointment time 1130am tomorrow.

## 2013-08-17 ENCOUNTER — Non-Acute Institutional Stay (HOSPITAL_COMMUNITY)
Admission: AD | Admit: 2013-08-17 | Discharge: 2013-08-17 | Disposition: A | Discharge status: Home / Self Care | Payer: Medicare Other | Source: Ambulatory Visit | Attending: Oncology | Admitting: Oncology

## 2013-08-17 ENCOUNTER — Other Ambulatory Visit: Payer: Self-pay | Admitting: Oncology

## 2013-08-17 DIAGNOSIS — M311 Thrombotic microangiopathy, unspecified: Secondary | ICD-10-CM | POA: Insufficient documentation

## 2013-08-17 DIAGNOSIS — M3119 Other thrombotic microangiopathy: Secondary | ICD-10-CM

## 2013-08-17 LAB — THERAPEUTIC PLASMA EXCHANGE (BLOOD BANK)
Plasma volume needed: 2600
UNIT DIVISION: 0
UNIT DIVISION: 0
Unit division: 0
Unit division: 0
Unit division: 0
Unit division: 0
Unit division: 0
Unit division: 0
Unit division: 0

## 2013-08-17 LAB — CBC WITH DIFFERENTIAL/PLATELET
Basophils Absolute: 0 10*3/uL (ref 0.0–0.1)
Basophils Relative: 0 % (ref 0–1)
EOS PCT: 0 % (ref 0–5)
Eosinophils Absolute: 0 10*3/uL (ref 0.0–0.7)
HEMATOCRIT: 24.8 % — AB (ref 36.0–46.0)
HEMOGLOBIN: 7.8 g/dL — AB (ref 12.0–15.0)
LYMPHS PCT: 10 % — AB (ref 12–46)
Lymphs Abs: 0.7 10*3/uL (ref 0.7–4.0)
MCH: 30.6 pg (ref 26.0–34.0)
MCHC: 31.5 g/dL (ref 30.0–36.0)
MCV: 97.3 fL (ref 78.0–100.0)
MONO ABS: 0.4 10*3/uL (ref 0.1–1.0)
Monocytes Relative: 6 % (ref 3–12)
NEUTROS ABS: 5.9 10*3/uL (ref 1.7–7.7)
Neutrophils Relative %: 84 % — ABNORMAL HIGH (ref 43–77)
Platelets: 131 10*3/uL — ABNORMAL LOW (ref 150–400)
RBC: 2.55 MIL/uL — AB (ref 3.87–5.11)
RDW: 14.7 % (ref 11.5–15.5)
WBC: 7 10*3/uL (ref 4.0–10.5)

## 2013-08-17 LAB — COMPREHENSIVE METABOLIC PANEL
ALT: 24 U/L (ref 0–35)
AST: 24 U/L (ref 0–37)
Albumin: 3.1 g/dL — ABNORMAL LOW (ref 3.5–5.2)
Alkaline Phosphatase: 64 U/L (ref 39–117)
BILIRUBIN TOTAL: 0.3 mg/dL (ref 0.3–1.2)
BUN: 27 mg/dL — ABNORMAL HIGH (ref 6–23)
CALCIUM: 8.7 mg/dL (ref 8.4–10.5)
CHLORIDE: 100 meq/L (ref 96–112)
CO2: 26 meq/L (ref 19–32)
Creatinine, Ser: 0.82 mg/dL (ref 0.50–1.10)
GFR, EST AFRICAN AMERICAN: 81 mL/min — AB (ref >90)
GFR, EST NON AFRICAN AMERICAN: 70 mL/min — AB (ref >90)
GLUCOSE: 82 mg/dL (ref 70–99)
Potassium: 3.3 mEq/L — ABNORMAL LOW (ref 3.7–5.3)
Sodium: 138 mEq/L (ref 137–147)
Total Protein: 6 g/dL (ref 6.0–8.3)

## 2013-08-17 LAB — RETICULOCYTES
RBC.: 2.55 MIL/uL — ABNORMAL LOW (ref 3.87–5.11)
RETIC COUNT ABSOLUTE: 109.7 10*3/uL (ref 19.0–186.0)
Retic Ct Pct: 4.3 % — ABNORMAL HIGH (ref 0.4–3.1)

## 2013-08-17 LAB — LACTATE DEHYDROGENASE: LDH: 318 U/L — ABNORMAL HIGH (ref 94–250)

## 2013-08-17 MED ORDER — DIPHENHYDRAMINE HCL 25 MG PO CAPS
25.0000 mg | ORAL_CAPSULE | Freq: Four times a day (QID) | ORAL | Status: DC | PRN
  Administered 2013-08-17: 25 mg via ORAL

## 2013-08-17 MED ORDER — ACETAMINOPHEN 325 MG PO TABS: 650.0000 mg | ORAL_TABLET | ORAL | Status: DC | PRN

## 2013-08-17 MED ORDER — ANTICOAGULANT SODIUM CITRATE 4% (200MG/5ML) IV SOLN
5.0000 mL | Freq: Once | Status: AC
  Administered 2013-08-17: 5 mL
  Filled 2013-08-17: qty 250

## 2013-08-17 MED ORDER — CALCIUM CARBONATE ANTACID 500 MG PO CHEW
2.0000 | CHEWABLE_TABLET | ORAL | Status: AC
  Administered 2013-08-17 (×2): 400 mg via ORAL

## 2013-08-17 MED ORDER — CALCIUM CARBONATE ANTACID 500 MG PO CHEW
CHEWABLE_TABLET | ORAL | Status: AC
  Administered 2013-08-17: 400 mg via ORAL
  Filled 2013-08-17: qty 4

## 2013-08-17 MED ORDER — METHYLPREDNISOLONE SODIUM SUCC 125 MG IJ SOLR
INTRAMUSCULAR | Status: AC
  Administered 2013-08-17: 60 mg via INTRAVENOUS
  Filled 2013-08-17: qty 2

## 2013-08-17 MED ORDER — SODIUM CHLORIDE 0.9 % IV SOLN
4.0000 g | Freq: Once | INTRAVENOUS | Status: AC
  Administered 2013-08-17: 4 g via INTRAVENOUS
  Filled 2013-08-17: qty 40

## 2013-08-17 MED ORDER — ACD FORMULA A 0.73-2.45-2.2 GM/100ML VI SOLN
500.0000 mL | Status: DC
  Administered 2013-08-17: 500 mL via INTRAVENOUS

## 2013-08-17 MED ORDER — DIPHENHYDRAMINE HCL 25 MG PO CAPS
ORAL_CAPSULE | ORAL | Status: AC
Start: 2013-08-17 — End: 2013-08-17
  Administered 2013-08-17: 25 mg via ORAL
  Filled 2013-08-17: qty 1

## 2013-08-17 MED ORDER — METHYLPREDNISOLONE SODIUM SUCC 125 MG IJ SOLR
60.0000 mg | Freq: Once | INTRAMUSCULAR | Status: AC
  Administered 2013-08-17: 60 mg via INTRAVENOUS

## 2013-08-17 NOTE — Progress Notes (Signed)
TPE- Tolerating well. Afebrile. Confirmed appt with pt for Monday 4/20 at 1130AM.

## 2013-08-18 LAB — THERAPEUTIC PLASMA EXCHANGE (BLOOD BANK)
PLASMA VOLUME NEEDED: 2600
UNIT DIVISION: 0
UNIT DIVISION: 0
UNIT DIVISION: 0
UNIT DIVISION: 0
Unit division: 0
Unit division: 0
Unit division: 0
Unit division: 0
Unit division: 0

## 2013-08-18 LAB — CULTURE, BLOOD (SINGLE)
Organism ID, Bacteria: NO GROWTH
Organism ID, Bacteria: NO GROWTH

## 2013-08-19 ENCOUNTER — Encounter: Payer: Medicare Other | Admitting: Oncology

## 2013-08-19 ENCOUNTER — Emergency Department (HOSPITAL_COMMUNITY): Payer: Medicare Other

## 2013-08-19 ENCOUNTER — Non-Acute Institutional Stay (HOSPITAL_COMMUNITY): Admission: AD | Admit: 2013-08-19 | Payer: Medicare Other | Source: Ambulatory Visit | Admitting: Oncology

## 2013-08-19 ENCOUNTER — Inpatient Hospital Stay (HOSPITAL_COMMUNITY): Payer: Medicare Other

## 2013-08-19 ENCOUNTER — Other Ambulatory Visit: Payer: Self-pay | Admitting: Oncology

## 2013-08-19 ENCOUNTER — Encounter (HOSPITAL_COMMUNITY): Payer: Self-pay | Admitting: Emergency Medicine

## 2013-08-19 ENCOUNTER — Encounter: Payer: Self-pay | Admitting: Oncology

## 2013-08-19 ENCOUNTER — Inpatient Hospital Stay (HOSPITAL_COMMUNITY)
Admission: EM | Admit: 2013-08-19 | Discharge: 2013-08-26 | DRG: 871 | Disposition: A | Discharge status: Home / Self Care | Payer: Medicare Other | Attending: Internal Medicine | Admitting: Internal Medicine

## 2013-08-19 DIAGNOSIS — Z9849 Cataract extraction status, unspecified eye: Secondary | ICD-10-CM

## 2013-08-19 DIAGNOSIS — R6510 Systemic inflammatory response syndrome (SIRS) of non-infectious origin without acute organ dysfunction: Secondary | ICD-10-CM

## 2013-08-19 DIAGNOSIS — S9030XA Contusion of unspecified foot, initial encounter: Secondary | ICD-10-CM | POA: Diagnosis present

## 2013-08-19 DIAGNOSIS — A419 Sepsis, unspecified organism: Secondary | ICD-10-CM | POA: Diagnosis present

## 2013-08-19 DIAGNOSIS — J189 Pneumonia, unspecified organism: Secondary | ICD-10-CM | POA: Diagnosis present

## 2013-08-19 DIAGNOSIS — K5900 Constipation, unspecified: Secondary | ICD-10-CM | POA: Diagnosis not present

## 2013-08-19 DIAGNOSIS — R000 Tachycardia, unspecified: Secondary | ICD-10-CM | POA: Diagnosis present

## 2013-08-19 DIAGNOSIS — D599 Acquired hemolytic anemia, unspecified: Secondary | ICD-10-CM | POA: Diagnosis present

## 2013-08-19 DIAGNOSIS — G8929 Other chronic pain: Secondary | ICD-10-CM | POA: Diagnosis present

## 2013-08-19 DIAGNOSIS — Z888 Allergy status to other drugs, medicaments and biological substances status: Secondary | ICD-10-CM

## 2013-08-19 DIAGNOSIS — J441 Chronic obstructive pulmonary disease with (acute) exacerbation: Secondary | ICD-10-CM | POA: Diagnosis present

## 2013-08-19 DIAGNOSIS — W2203XA Walked into furniture, initial encounter: Secondary | ICD-10-CM | POA: Diagnosis present

## 2013-08-19 DIAGNOSIS — Z882 Allergy status to sulfonamides status: Secondary | ICD-10-CM

## 2013-08-19 DIAGNOSIS — IMO0002 Reserved for concepts with insufficient information to code with codable children: Secondary | ICD-10-CM | POA: Diagnosis not present

## 2013-08-19 DIAGNOSIS — I5032 Chronic diastolic (congestive) heart failure: Secondary | ICD-10-CM | POA: Diagnosis present

## 2013-08-19 DIAGNOSIS — R5381 Other malaise: Secondary | ICD-10-CM | POA: Diagnosis present

## 2013-08-19 DIAGNOSIS — J4489 Other specified chronic obstructive pulmonary disease: Secondary | ICD-10-CM | POA: Diagnosis present

## 2013-08-19 DIAGNOSIS — Z79899 Other long term (current) drug therapy: Secondary | ICD-10-CM

## 2013-08-19 DIAGNOSIS — F3289 Other specified depressive episodes: Secondary | ICD-10-CM | POA: Diagnosis present

## 2013-08-19 DIAGNOSIS — M3119 Other thrombotic microangiopathy: Secondary | ICD-10-CM

## 2013-08-19 DIAGNOSIS — M311 Thrombotic microangiopathy, unspecified: Secondary | ICD-10-CM | POA: Diagnosis present

## 2013-08-19 DIAGNOSIS — R6520 Severe sepsis without septic shock: Secondary | ICD-10-CM

## 2013-08-19 DIAGNOSIS — F329 Major depressive disorder, single episode, unspecified: Secondary | ICD-10-CM | POA: Diagnosis present

## 2013-08-19 DIAGNOSIS — I509 Heart failure, unspecified: Secondary | ICD-10-CM | POA: Diagnosis present

## 2013-08-19 DIAGNOSIS — D649 Anemia, unspecified: Secondary | ICD-10-CM

## 2013-08-19 DIAGNOSIS — Z961 Presence of intraocular lens: Secondary | ICD-10-CM | POA: Diagnosis not present

## 2013-08-19 DIAGNOSIS — Y92009 Unspecified place in unspecified non-institutional (private) residence as the place of occurrence of the external cause: Secondary | ICD-10-CM | POA: Diagnosis not present

## 2013-08-19 DIAGNOSIS — B3789 Other sites of candidiasis: Secondary | ICD-10-CM | POA: Diagnosis present

## 2013-08-19 DIAGNOSIS — F17200 Nicotine dependence, unspecified, uncomplicated: Secondary | ICD-10-CM | POA: Diagnosis present

## 2013-08-19 DIAGNOSIS — Z9851 Tubal ligation status: Secondary | ICD-10-CM | POA: Diagnosis not present

## 2013-08-19 DIAGNOSIS — J9600 Acute respiratory failure, unspecified whether with hypoxia or hypercapnia: Secondary | ICD-10-CM | POA: Diagnosis present

## 2013-08-19 DIAGNOSIS — R5383 Other fatigue: Secondary | ICD-10-CM | POA: Diagnosis present

## 2013-08-19 DIAGNOSIS — R509 Fever, unspecified: Secondary | ICD-10-CM

## 2013-08-19 DIAGNOSIS — Z862 Personal history of diseases of the blood and blood-forming organs and certain disorders involving the immune mechanism: Secondary | ICD-10-CM

## 2013-08-19 DIAGNOSIS — R6521 Severe sepsis with septic shock: Secondary | ICD-10-CM

## 2013-08-19 DIAGNOSIS — J811 Chronic pulmonary edema: Secondary | ICD-10-CM

## 2013-08-19 DIAGNOSIS — M549 Dorsalgia, unspecified: Secondary | ICD-10-CM | POA: Diagnosis present

## 2013-08-19 DIAGNOSIS — B370 Candidal stomatitis: Secondary | ICD-10-CM | POA: Diagnosis present

## 2013-08-19 DIAGNOSIS — E876 Hypokalemia: Secondary | ICD-10-CM | POA: Diagnosis not present

## 2013-08-19 DIAGNOSIS — J449 Chronic obstructive pulmonary disease, unspecified: Secondary | ICD-10-CM | POA: Diagnosis present

## 2013-08-19 DIAGNOSIS — R0902 Hypoxemia: Secondary | ICD-10-CM

## 2013-08-19 DIAGNOSIS — Z9289 Personal history of other medical treatment: Secondary | ICD-10-CM

## 2013-08-19 LAB — COMPREHENSIVE METABOLIC PANEL
ALK PHOS: 57 U/L (ref 39–117)
ALT: 31 U/L (ref 0–35)
AST: 31 U/L (ref 0–37)
Albumin: 3 g/dL — ABNORMAL LOW (ref 3.5–5.2)
BUN: 27 mg/dL — ABNORMAL HIGH (ref 6–23)
CALCIUM: 8.3 mg/dL — AB (ref 8.4–10.5)
CO2: 25 mEq/L (ref 19–32)
CREATININE: 1.09 mg/dL (ref 0.50–1.10)
Chloride: 105 mEq/L (ref 96–112)
GFR calc non Af Amer: 50 mL/min — ABNORMAL LOW (ref >90)
GFR, EST AFRICAN AMERICAN: 58 mL/min — AB (ref >90)
GLUCOSE: 108 mg/dL — AB (ref 70–99)
Potassium: 4.1 mEq/L (ref 3.7–5.3)
Sodium: 142 mEq/L (ref 137–147)
TOTAL PROTEIN: 5.6 g/dL — AB (ref 6.0–8.3)
Total Bilirubin: 0.4 mg/dL (ref 0.3–1.2)

## 2013-08-19 LAB — URINALYSIS, ROUTINE W REFLEX MICROSCOPIC
Bilirubin Urine: NEGATIVE
Glucose, UA: NEGATIVE mg/dL
KETONES UR: NEGATIVE mg/dL
LEUKOCYTES UA: NEGATIVE
NITRITE: NEGATIVE
Protein, ur: NEGATIVE mg/dL
SPECIFIC GRAVITY, URINE: 1.018 (ref 1.005–1.030)
Urobilinogen, UA: 1 mg/dL (ref 0.0–1.0)
pH: 7 (ref 5.0–8.0)

## 2013-08-19 LAB — TROPONIN I

## 2013-08-19 LAB — URINE MICROSCOPIC-ADD ON

## 2013-08-19 LAB — I-STAT ARTERIAL BLOOD GAS, ED
Acid-base deficit: 3 mmol/L — ABNORMAL HIGH (ref 0.0–2.0)
BICARBONATE: 22.4 meq/L (ref 20.0–24.0)
O2 Saturation: 87 %
PCO2 ART: 41.8 mmHg (ref 35.0–45.0)
Patient temperature: 101
TCO2: 24 mmol/L (ref 0–100)
pH, Arterial: 7.343 — ABNORMAL LOW (ref 7.350–7.450)
pO2, Arterial: 60 mmHg — ABNORMAL LOW (ref 80.0–100.0)

## 2013-08-19 LAB — MRSA PCR SCREENING: MRSA by PCR: NEGATIVE

## 2013-08-19 LAB — CBC WITH DIFFERENTIAL/PLATELET
Basophils Absolute: 0 10*3/uL (ref 0.0–0.1)
Basophils Relative: 0 % (ref 0–1)
EOS ABS: 0 10*3/uL (ref 0.0–0.7)
EOS PCT: 0 % (ref 0–5)
HCT: 25.1 % — ABNORMAL LOW (ref 36.0–46.0)
HEMOGLOBIN: 7.9 g/dL — AB (ref 12.0–15.0)
Lymphocytes Relative: 16 % (ref 12–46)
Lymphs Abs: 1.3 10*3/uL (ref 0.7–4.0)
MCH: 30.9 pg (ref 26.0–34.0)
MCHC: 31.5 g/dL (ref 30.0–36.0)
MCV: 98 fL (ref 78.0–100.0)
MONOS PCT: 3 % (ref 3–12)
Monocytes Absolute: 0.3 10*3/uL (ref 0.1–1.0)
Neutro Abs: 6.7 10*3/uL (ref 1.7–7.7)
Neutrophils Relative %: 81 % — ABNORMAL HIGH (ref 43–77)
Platelets: 146 10*3/uL — ABNORMAL LOW (ref 150–400)
RBC: 2.56 MIL/uL — AB (ref 3.87–5.11)
RDW: 15.4 % (ref 11.5–15.5)
WBC: 8.2 10*3/uL (ref 4.0–10.5)

## 2013-08-19 LAB — LACTATE DEHYDROGENASE: LDH: 356 U/L — AB (ref 94–250)

## 2013-08-19 LAB — POCT I-STAT, CHEM 8
BUN: 27 mg/dL — ABNORMAL HIGH (ref 6–23)
CALCIUM ION: 1.11 mmol/L — AB (ref 1.13–1.30)
CREATININE: 1 mg/dL (ref 0.50–1.10)
Chloride: 100 mEq/L (ref 96–112)
GLUCOSE: 87 mg/dL (ref 70–99)
HEMATOCRIT: 25 % — AB (ref 36.0–46.0)
Hemoglobin: 8.5 g/dL — ABNORMAL LOW (ref 12.0–15.0)
POTASSIUM: 3.2 meq/L — AB (ref 3.7–5.3)
Sodium: 141 mEq/L (ref 137–147)
TCO2: 26 mmol/L (ref 0–100)

## 2013-08-19 LAB — I-STAT CG4 LACTIC ACID, ED: Lactic Acid, Venous: 1.11 mmol/L (ref 0.5–2.2)

## 2013-08-19 LAB — GLUCOSE, CAPILLARY: Glucose-Capillary: 194 mg/dL — ABNORMAL HIGH (ref 70–99)

## 2013-08-19 LAB — PREPARE RBC (CROSSMATCH)

## 2013-08-19 MED ORDER — IPRATROPIUM-ALBUTEROL 0.5-2.5 (3) MG/3ML IN SOLN
3.0000 mL | RESPIRATORY_TRACT | Status: DC
  Administered 2013-08-19 – 2013-08-20 (×6): 3 mL via RESPIRATORY_TRACT
  Filled 2013-08-19 (×8): qty 3

## 2013-08-19 MED ORDER — SODIUM CHLORIDE 0.9 % IJ SOLN
3.0000 mL | Freq: Two times a day (BID) | INTRAMUSCULAR | Status: DC
  Administered 2013-08-19 – 2013-08-26 (×5): 3 mL via INTRAVENOUS

## 2013-08-19 MED ORDER — PIPERACILLIN-TAZOBACTAM 3.375 G IVPB
3.3750 g | Freq: Once | INTRAVENOUS | Status: AC
  Administered 2013-08-19: 3.375 g via INTRAVENOUS
  Filled 2013-08-19: qty 50

## 2013-08-19 MED ORDER — IPRATROPIUM BROMIDE 0.02 % IN SOLN
0.5000 mg | Freq: Once | RESPIRATORY_TRACT | Status: AC
  Administered 2013-08-19: 0.5 mg via RESPIRATORY_TRACT
  Filled 2013-08-19: qty 2.5

## 2013-08-19 MED ORDER — SODIUM CHLORIDE 0.9 % IV BOLUS (SEPSIS)
1000.0000 mL | Freq: Once | INTRAVENOUS | Status: AC
  Administered 2013-08-19: 1000 mL via INTRAVENOUS

## 2013-08-19 MED ORDER — ALBUTEROL SULFATE (2.5 MG/3ML) 0.083% IN NEBU
5.0000 mg | INHALATION_SOLUTION | Freq: Once | RESPIRATORY_TRACT | Status: AC
  Administered 2013-08-19: 5 mg via RESPIRATORY_TRACT
  Filled 2013-08-19: qty 6

## 2013-08-19 MED ORDER — VANCOMYCIN HCL IN DEXTROSE 1-5 GM/200ML-% IV SOLN
1000.0000 mg | Freq: Once | INTRAVENOUS | Status: AC
  Administered 2013-08-19: 1000 mg via INTRAVENOUS
  Filled 2013-08-19: qty 200

## 2013-08-19 MED ORDER — ENOXAPARIN SODIUM 40 MG/0.4ML ~~LOC~~ SOLN
40.0000 mg | SUBCUTANEOUS | Status: DC
  Administered 2013-08-20: 40 mg via SUBCUTANEOUS
  Filled 2013-08-19 (×4): qty 0.4

## 2013-08-19 MED ORDER — PIPERACILLIN-TAZOBACTAM 3.375 G IVPB
3.3750 g | Freq: Three times a day (TID) | INTRAVENOUS | Status: DC
  Administered 2013-08-19 – 2013-08-26 (×20): 3.375 g via INTRAVENOUS
  Filled 2013-08-19 (×22): qty 50

## 2013-08-19 MED ORDER — VANCOMYCIN HCL IN DEXTROSE 750-5 MG/150ML-% IV SOLN
750.0000 mg | Freq: Two times a day (BID) | INTRAVENOUS | Status: DC
  Administered 2013-08-20 – 2013-08-21 (×3): 750 mg via INTRAVENOUS
  Filled 2013-08-19 (×5): qty 150

## 2013-08-19 MED ORDER — METHYLPREDNISOLONE SODIUM SUCC 125 MG IJ SOLR
125.0000 mg | Freq: Once | INTRAMUSCULAR | Status: AC
  Administered 2013-08-19: 125 mg via INTRAVENOUS
  Filled 2013-08-19: qty 2

## 2013-08-19 NOTE — Progress Notes (Signed)
08/19/2013 RN was in patient room, doing history and physical. Patient daughter felt patient was have a change in mental status, she was sweaty and breathing pattern had change, and sounded coarse. RN did notified [facility] to assess patient and eventually [facility] was called by Rapid Response. Patient vitals were stable, temp taken rectally was 98.1. [facility] RN.

## 2013-08-19 NOTE — Progress Notes (Signed)
[facility] Progress Note Patient Name: [name] DOB: July 01, 1940 MRN: [id number]  Date of Service  08/19/2013   HPI/Events of Note   PT with more increased WOB and AMS  eICU Interventions  Will ask PA to see the pt, may need to tfr to ICU   Intervention Category Major Interventions: Respiratory failure - evaluation and management;Change in mental status - evaluation and management  Elsie Stain 08/19/2013, 7:25 PM

## 2013-08-19 NOTE — Progress Notes (Signed)
ANTIBIOTIC CONSULT NOTE - INITIAL  Pharmacy Consult:  Vancomycin / Zosyn Indication:  Sepsis  Allergies  Allergen Reactions  . Cefuroxime Axetil Other (See Comments)    "jittery"  . Adhesive [Tape] Other (See Comments)    Blisters, band-aide brand   . Other Other (See Comments)    Wool: Reaction is hives [employer] tape: Reaction is blistering   . Sulfa Antibiotics Hives  . Sulfa Drugs Cross Reactors Other (See Comments)    unknown    Patient Measurements: Height: 5' 4.17" (163 cm) Weight: 173 lb 15.1 oz (78.9 kg) IBW/kg (Calculated) : 55.1  Vital Signs: Temp: 98.7 F (37.1 C) (04/20 1747) Temp src: Oral (04/20 1747) BP: 112/48 mmHg (04/20 1747) Pulse Rate: 91 (04/20 1747) Intake/Output from this shift: Total I/O In: 1000 [I.V.:1000] Out: 100 [Urine:100]  Labs:  Recent Labs  08/17/13 1100 08/17/13 1217 08/19/13 1214  WBC 7.0  --  8.2  HGB 7.8* 8.5* 7.9*  PLT 131*  --  146*  CREATININE 0.82 1.00 1.09   Estimated Creatinine Clearance: 48.3 ml/min (by C-G formula based on Cr of 1.09). No results found for this basename: VANCOTROUGH, Corlis Leak, VANCORANDOM, GENTTROUGH, GENTPEAK, GENTRANDOM, TOBRATROUGH, TOBRAPEAK, TOBRARND, AMIKACINPEAK, AMIKACINTROU, AMIKACIN,  in the last 72 hours   Microbiology: Recent Results (from the past 720 hour(s))  CULTURE, BLOOD (SINGLE)     Status: None   Collection Time    08/12/13 11:40 AM      Result Value Ref Range Status   Organism ID, Bacteria NO GROWTH 5 DAYS   Final  CULTURE, BLOOD (SINGLE)     Status: None   Collection Time    08/12/13 11:50 AM      Result Value Ref Range Status   Organism ID, Bacteria NO GROWTH 5 DAYS   Final  RESPIRATORY VIRUS PANEL     Status: None   Collection Time    08/12/13  8:58 PM      Result Value Ref Range Status   Source - RVPAN NASAL SWAB   Corrected   Comment: CORRECTED ON 04/15 AT 2053: PREVIOUSLY REPORTED AS NASAL SWAB   Respiratory Syncytial Virus A NOT DETECTED    Final   Respiratory Syncytial Virus B NOT DETECTED   Final   Influenza A NOT DETECTED   Final   Influenza B NOT DETECTED   Final   Parainfluenza 1 NOT DETECTED   Final   Parainfluenza 2 NOT DETECTED   Final   Parainfluenza 3 NOT DETECTED   Final   Metapneumovirus NOT DETECTED   Final   Rhinovirus NOT DETECTED   Final   Adenovirus NOT DETECTED   Final   Influenza A H1 NOT DETECTED   Final   Influenza A H3 NOT DETECTED   Final   Comment: (NOTE)           Normal Reference Range for each Analyte: NOT DETECTED     Testing performed using the Luminex xTAG Respiratory Viral Panel test     kit.     This test was developed and its performance characteristics determined     by [employer]. It has not been cleared or approved by the [place]     Food and Drug Administration. This test is used for clinical purposes.     It should not be regarded as investigational or for research. This     laboratory is certified under the [place] (CLIA)  as qualified to perform high complexity     clinical laboratory testing.     Performed at [facility] History: Past Medical History  Diagnosis Date  . Septicemia 03/2011  . Normal echocardiogram 03/15/11  . History of TTP (thrombotic thrombocytopenic purpura)   . Acute delirium   . Bacteremia 03/2011  . COPD (chronic obstructive pulmonary disease)   . Blood transfusion   . Anemia   . Arthritis   . Chronic back pain   . Spinal stenosis, lumbar   . Blood dyscrasia   . Depression     "mild"  . Tobacco abuse 05/10/2013  . Oral thrush 08/08/2013  . Hypokalemia 08/08/2013    Steroid related  . 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 Bronchitis, chronic obstructive w acute bronchitis 08/12/2013      Assessment: 21 YOF with history of TTP recently discharged on 08/16/17.  Patient returned today with generalized malaise.  Pharmacy consulted to manage  vancomycin and Zosyn for possible sepsis, likely bacteremia secondary to subclavian line.  Noted patient received vancomycin 1gm IV and Zosyn 3.375gm IV around 1430 today.  Baseline labs reviewed.   Goal of Therapy:  Vancomycin trough level 15-20 mcg/ml   Plan:  - Vanc 742m IV Q12H, start tomorrow - Zosyn 3.375gm IV Q8H, 4 hr infusion - Monitor renal fxn, clinical course, vanc trough as indicated    Alvena Kiernan D. DMina Marble PharmD, BCPS Pager:  [phone]/20/2015, 6:02 PM

## 2013-08-19 NOTE — Progress Notes (Signed)
08/19/2013 Patient transfer from the emergency room to 2central at 1740. She is alert, oriented and normally ambulatory, but is very weak. Patient have a Hickman cath to left chest, area around is bruised. She have bruises on her arms, on right foot, also swelling on the right foot. [facility] RN.

## 2013-08-19 NOTE — ED Notes (Signed)
Pt just discharged Wed after being tx for respiratory infection.  Family called EMS for increased weakness and sob.  When ems arrived pt was satting 82% on RA. O2 only increased to 86% with 4L and improved to 90% after duo nebx.  Pt ao x 4.  Initial oral temp at home was 101.7.  EMS gave 650 tylenol at 1135 and state new temp at 99.8.

## 2013-08-19 NOTE — ED Notes (Signed)
Teaching services at the bedside

## 2013-08-19 NOTE — Significant Event (Signed)
Rapid Response Event Note  Overview: Time Called: 1905 Arrival Time: 1907 Event Type: Respiratory  Initial Focused Assessment:    Called by Justice Rocher, RN with family concern over pt's change in breathing pattern, diaphoresis, and altered mental status.  On arrival to room pt is alseep in bed,  Wakens to voice, oriented to  self, place and time.  Denies, SOB, pain.   Diaphoretic.  Skin warm and clammy, pale.   VS: 107/54 83 Sinus Rhythm. RR 15  93% on 4 l [place]. Bilat breath sounds clear, diminished in bases.  MIld WOB with some abdominal muscle use noted.   Abd distended, soft, active bowel sounds.  Daughter Gwinda Passe concerned that pt falls to sleep frequently and is having periods of apnea.She is requesting transfer to higher level of care.  Dr Joya Gaskins contacted via [place].  Karen Kays arrived at bedside as pt's linen was being changed . After pt examination and consultation with Dr Joya Gaskins the decision was  made to transfer pt to ICU.   Interventions: Increased nasal canula to 6 liters after sats trended to 90-91 on 4 liters.                            Changed bed linen, obtained rectal temp. Transfer to ICU per Dr Joya Gaskins   Event Summary: Name of Physician Notified: Dr Joya Gaskins  at [phone]  Name of Consulting Physician Notified: Karen Kays, PA at [facility]

## 2013-08-19 NOTE — Consult Note (Signed)
Name: [name] MRN: [id number] DOB: 1941-04-28    ADMISSION DATE:  08/19/2013 CONSULTATION DATE:  08/19/2013  REFERRING MD :  Murlean Caller PRIMARY SERVICE:  [facility]  CHIEF COMPLAINT:  Fever  BRIEF PATIENT DESCRIPTION: 73 y.o. F recently discharged 4/17 after being admitted for fever and weakness.  Was treated with empiric IV Vanc on prior admission.  Now presents with same symptoms.  PCCM consulted for recs.  SIGNIFICANT EVENTS / STUDIES:  4/17 discharged after admission for fever/weakness, received IV vanc X 3. 4/20 presented again with fever/malaise/mild hypotension.  LINES / TUBES: HD left [place] 3/13 >>>  CULTURES: Blood x 2 3/13 >>> Urine 3/13 >>>  ANTIBIOTICS: Vanc 4/20 >>> Zosyn 4/20 >>>   HISTORY OF PRESENT ILLNESS:  [name] is a 73 y.o. F with PMH as outlined below.  She was recently discharged on 4/17 after being hospitalized for fever, weakness, non-productive cough.  During last admission, she was treated with IV Vancomycin x 3 days and blood cultures were negative x 2.  Vascular catheter site did not appear obviously infected. She was scheduled for a f/u appt with Dr. Beryle Beams (sees him for TTP); however, she began to develop weakness and fever up to 102.  Pt's husband was instructed to bring her to the ED for further evaluation. In ED, BP was around 093 systolic.  PCCM was consulted for hypotension and concern for development of septic shock. On questioning pt and her family, her baseline BP runs anywhere from 95 - 235 systolic.  Her BP now is within her normal range per her daughter who works in EMS.  Pt is however much weaker than usual and is very tired.  Daughter states that pt is usually more awake than she currently is.  She denies any headache, stiff neck, visual changes, chest pain, SOB, wheezing, abdominal pain, N/V/D/C.  She does endorse abdominal bloating and lower extremity edema.  No hx of heart failure and has not had similar symptoms in the past.  Does  report moderate right foot pain; however, attributes this to kicking a stool without shoes on.  Right foot has been ecchymotic since this occurred. Pt reports that she takes 40mg  prednisone daily; however, has not taken it today.   PAST MEDICAL HISTORY :  Past Medical History  Diagnosis Date  . Septicemia 03/2011  . Normal echocardiogram 03/15/11  . History of TTP (thrombotic thrombocytopenic purpura)   . Acute delirium   . Bacteremia 03/2011  . COPD (chronic obstructive pulmonary disease)   . Blood transfusion   . Anemia   . Arthritis   . Chronic back pain   . Spinal stenosis, lumbar   . Blood dyscrasia   . Depression     "mild"  . Tobacco abuse 05/10/2013  . Oral thrush 08/08/2013  . Hypokalemia 08/08/2013    Steroid related  . 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 Bronchitis, chronic obstructive w acute bronchitis 08/12/2013   Past Surgical History  Procedure Laterality Date  . Cesarean section  10/09/1976  . Tonsillectomy    . Tubal ligation    . Cataract extraction w/ intraocular lens  implant, bilateral  ~ 2010  . Eye surgery    . Lumbar laminectomy/decompression microdiscectomy  03/16/2011    Procedure: LUMBAR LAMINECTOMY/DECOMPRESSION MICRODISCECTOMY;  Surgeon: Elaina Hoops;  Location: [place] [facility];  Service: Neurosurgery;  Laterality: N/A;  . Tee without cardioversion  03/21/2011    Procedure: TRANSESOPHAGEAL  ECHOCARDIOGRAM (TEE);  Surgeon: Laverda Page;  Location: [facility] END[facility];  Service: Cardiovascular;  Laterality: N/A;  TEE for vegetations  . Insertion of dialysis catheter Left 07/12/2013    Procedure: INSERTION OF DIALYSIS CATHETER   with Ultrasound;  Surgeon: Rosetta Posner, MD;  Location: [place];  Service: Vascular;  Laterality: Left;   Prior to Admission medications   Medication Sig Start Date End Date Taking? Authorizing Provider  azaTHIOprine (IMURAN) 50 MG tablet Take 100 mg by mouth daily.   Yes Historical Provider, MD    cholecalciferol (VITAMIN D) 400 UNITS TABS Take 400 Units by mouth.   Yes Historical Provider, MD  cyanocobalamin 100 MCG tablet Take 1,000 mcg by mouth daily.   Yes Historical Provider, MD  fluconazole (DIFLUCAN) 100 MG tablet Take 100 mg by mouth daily.   Yes Historical Provider, MD  FLUoxetine (PROZAC) 20 MG capsule Take 20 mg by mouth daily.    Yes Kirk Ruths, MD  folic acid (FOLVITE) 1 MG tablet Take 1 mg by mouth daily.   Yes Historical Provider, MD  gabapentin (NEURONTIN) 400 MG capsule Take 400 mg by mouth 3 (three) times daily.   Yes Historical Provider, MD  Multiple Vitamin (MULTIVITAMIN) tablet Take 1 tablet by mouth 2 (two) times daily.    Yes Historical Provider, MD  phenol-menthol (CEPASTAT) 14.5 MG lozenge Place 1 lozenge inside cheek as needed for sore throat. 08/14/13  Yes Joni Reining, DO  potassium chloride SA (K-DUR,KLOR-CON) 20 MEQ tablet Take 20 mEq by mouth daily.  08/08/13  Yes Historical Provider, MD  predniSONE (DELTASONE) 20 MG tablet Take 2 tablets (40 mg total) by mouth daily with breakfast. Only on days not receiving Plasma exchange. 08/14/13  Yes Joni Reining, DO  QUEtiapine (SEROQUEL) 25 MG tablet Take 25 mg by mouth at bedtime.   Yes Historical Provider, MD  tiotropium (SPIRIVA) 18 MCG inhalation capsule Place 18 mcg into inhaler and inhale daily.     Yes Historical Provider, MD   Allergies  Allergen Reactions  . Cefuroxime Axetil Other (See Comments)    "jittery"  . Adhesive [Tape] Other (See Comments)    Blisters, band-aide brand   . Other Other (See Comments)    Wool: Reaction is hives [employer] tape: Reaction is blistering   . Sulfa Antibiotics Hives  . Sulfa Drugs Cross Reactors Other (See Comments)    unknown    FAMILY HISTORY:  No family history on file. SOCIAL HISTORY:  reports that she has been smoking Cigarettes.  She has a 137.5 pack-year smoking history. She has never used smokeless tobacco. She reports that she  drinks alcohol. She reports that she does not use illicit drugs.  REVIEW OF SYSTEMS:   Constitutional: Negative for, chills, weight loss, and diaphoresis.  Positive for fevers, weakness. HENT: Negative for hearing loss, ear pain, nosebleeds, congestion, sore throat, neck pain, tinnitus and ear discharge.   Eyes: Negative for blurred vision, double vision, photophobia, pain, discharge and redness.  Respiratory: Negative for hemoptysis, sputum production, shortness of breath, wheezing and stridor.  Positive for mild cough. Cardiovascular: Negative for chest pain, palpitations, orthopnea, claudication, and PND. Positive for LE edema. Gastrointestinal: Negative for heartburn, nausea, vomiting, abdominal pain, diarrhea, constipation, blood in stool and melena.  Genitourinary: Negative for dysuria, urgency, frequency, hematuria and flank pain.  Musculoskeletal: Negative for myalgias, back pain, joint pain and falls.  Skin: Negative for itching and rash.  Neurological: Negative for dizziness, tingling, tremors, sensory change,  speech change, focal weakness, seizures, loss of consciousness, and headaches.  Endo/Heme/Allergies: Negative for environmental allergies and polydipsia.  SUBJECTIVE:  Feels a little more awake than when she first arrived in ED.  BP increasing after IVF resuscitation.  Denies chest pain, SOB.  VITAL SIGNS: Temp:  [101 F (38.3 C)] 101 F (38.3 C) (04/20 1225) Pulse Rate:  [93-107] 94 (04/20 1548) Resp:  [15-25] 17 (04/20 1548) BP: (80-115)/(36-68) 112/48 mmHg (04/20 1548) SpO2:  [88 %-100 %] 91 % (04/20 1548)  PHYSICAL EXAMINATION: General: Pleasant female, resting in bed, in NAD though is fairly tired. Neuro: A&O x 3, non-focal.  HEENT: [place]/AT. PERRL, sclerae anicteric. Cardiovascular: RRR, no M/R/G.  Lungs: Respirations even and unlabored.  CTA bilaterally, No W/R/R.  Abdomen: BS x 4, soft, NT, mildly distended.  Musculoskeletal: No gross deformities, 1+ non pitting  LE edema.  Right foot ecchymotic, no deformities. Skin: Intact, warm, no rashes.     Recent Labs Lab 08/16/13 1100 08/17/13 1100 08/17/13 1217 08/19/13 1214  NA 141 138 141 142  K 3.8 3.3* 3.2* 4.1  CL 103 100 100 105  CO2 25 26  --  25  BUN 31* 27* 27* 27*  CREATININE 0.80 0.82 1.00 1.09  GLUCOSE 133* 82 87 108*    Recent Labs Lab 08/16/13 1100 08/17/13 1100 08/17/13 1217 08/19/13 1214  HGB 8.6* 7.8* 8.5* 7.9*  HCT 26.4* 24.8* 25.0* 25.1*  WBC 6.3 7.0  --  8.2  PLT 130* 131*  --  146*   Dg Chest Port 1 View  (if Code Sepsis Called)  08/19/2013   CLINICAL DATA:  Fever, shortness of breath, weakness, confusion, history COPD, smoking  EXAM: PORTABLE CHEST - 1 VIEW  COMPARISON:  Portable exam 1229 hr compared to 08/12/2013  FINDINGS: Left subclavian dual-lumen central venous catheter unchanged distal tip projecting over cavoatrial junction.  Upper normal heart size.  Calcified tortuous aorta.  Mediastinal contours and pulmonary vascularity normal.  Bronchitic changes and right basilar atelectasis.  No gross infiltrate, pleural effusion or pneumothorax.  Osseous demineralization.  IMPRESSION: Mild bronchitic changes with right basilar atelectasis.   Electronically Signed   By: Lavonia Dana M.D.   On: 08/19/2013 12:52    ASSESSMENT / PLAN:  Fever Concern for sepsis/bacteremia Mild hypotension  - baseline SBP 95 - 110 per pt and her daughter. LE Edema - no hx of heart failure, last echo in 2012 COPD - unable to find PFT's in EPIC system. Hx of TTP - Hematology following. Recs: - Would recommend stress steroids.  Received 125mg  Solumedrol in ED, could start 60mg  Solumedrol q8hrs. - Cont IVF's. - Empiric abx, narrow as cultures result. - Consider echo for evaluation of EF given elevated BNP and pitting edema. - Bronchodilators PRN for wheezing. - Have informed pt, her husband, and her daughter that if pts mental status and/or respiratory effort declines or if hypotension  worsens please notify CCM immediately.  Montey Hora, [facility] Pgr: [phone]  or [phone]  Patient usually has a BP of 96-045 systolic and given fluid overload would not recommend any further fluid at this time.  ABG reviewed and appears hypoxic consistent with pulmonary edema, agree that patient will need diureses but not while actively septic.  The lactic acid levels appears appropriate, would not use central line and pressors at this time unless SBP not MAP is low (low diastolic BP chronically and SBP of 90-110) specially  with improving mental status.  The main issue here will be source control as the patient has a concern for line infection but given end-stage access would be hesitant to remove that line unless sure that is the source of infection.  Recommend calling ID on consultation and only vascular surgery will be able to place other line (bilateral IJ and right Potter are obstructed).  Family informed in no uncertain terms that if mental status deteriorates then will need transfer to ICU.  I spoke at length with family and patient, they already have a living will and will be bringing it tomorrow for evaluation of code status.  PCCM will be available PRN at this point.  Patient seen and examined, agree with above note.  I dictated the care and orders written for this patient under my direction.  Rush Farmer, MD [phone]

## 2013-08-19 NOTE — ED Notes (Signed)
Patient received a total of 3200 ml of NaCL at this time

## 2013-08-19 NOTE — ED Notes (Signed)
CCM at bedside 

## 2013-08-19 NOTE — H&P (Signed)
Date: 08/19/2013               Patient Name:  [name] MRN: [id number]  DOB: 01/20/1941 Age / Sex: 73 y.o., female   PCP: Kirk Ruths, MD         Medical Service: Internal Medicine Teaching Service         Attending Physician: Dr. Dominic Pea, DO    First Contact: Dr. Heber Eupora Pager: [phone]  Second Contact: Dr. Eula Fried Pager: [phone]       After Hours (After 5p/  First Contact Pager: [phone]  weekends / holidays): Second Contact Pager: [phone]   Chief Complaint: generalized malaise, cough, SOB  History of Present Illness:  This is a 73yo F with PMH COPD, tobacco abuse, TTP  (followed by Dr. 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 Patient was recently admitted to [facility] from 4/13-4/17 for similar complaints to malaise, fever, nonproductive cough. Patient was treated w/ empiric IV vancomycin x 3 days for possible bacteremia 2/2 line infection (vascular cath to L subclavian).  BCx negative. Resp virus panel negative. However, patient's catheter site did not look infected and patient improved and was discharged back home without antibiotics.   According to Dr. Beryle Beams, patient's symptoms had improved when he saw her in the office on Friday. Daughter does state that patient's cough is still present, but is improving. Patient has undergone daily plasmapheresis since 4/3 up until last Thursday when Dr. Beryle Beams skipped a treatment 2/2 plateauing platelet levels. Plasmapheresis was done again on Friday and Saturday and pt then skipped treatment on Sunday. Pt also on chronic steroids for her TTP (receiving prednisone 80m IV w/ each plasmapheresis session and taking prednisone 421mPO at home on skipped days). Per daughter, patient had been doing well and acting her  normal self until late last night when she became increasingly drowsy and spiked fever. Denies abd pain, N/V/D, chills, dysuria, CP, SOB.   Pt with soft blood pressure in the ED to 90s/50s, fever to 101 rectally, HR 106 satting 92-100% on 4LPM [place]. She received 4L NS and was started on vanc/zosyn. No leukocytosis. PLT level 146K, which is improved from 4/18 when it was 131K. Trop neg x 1.  Lactate 1.1. LDH elevated at 356. BCX x 2 drawn. CXR showed mild bronchitic changes w/ R basilar atelectasis. Patient received duoneb and solumedrol IV in ED for wheezing. PCCM was consulted, though did not think pt appropriate for ICU at this time.   Meds: Current Facility-Administered Medications  Medication Dose Route Frequency Provider Last Rate Last Dose  . piperacillin-tazobactam (ZOSYN) IVPB 3.375 g  3.375 g Intravenous Once KeMirna MiresMD 12.5 mL/hr at 08/19/13 1427 3.375 g at 08/19/13 1427  . sodium chloride 0.9 % bolus 1,000 mL  1,000 mL Intravenous Once JaAnnia BeltMD      . vancomycin (VANCOCIN) IVPB 1000 mg/200 mL premix  1,000 mg Intravenous Once KeMirna MiresMD 200 mL/hr at 08/19/13 1438 1,000 mg at 08/19/13 1438   Current Outpatient Prescriptions  Medication Sig Dispense Refill  . azaTHIOprine (IMURAN) 50 MG tablet Take 100 mg by mouth daily.      . cholecalciferol (VITAMIN D) 400 UNITS TABS Take 400 Units by mouth.      . cyanocobalamin 100 MCG tablet Take  1,000 mcg by mouth daily.      . fluconazole (DIFLUCAN) 100 MG tablet Take 100 mg by mouth daily.      Marland Kitchen FLUoxetine (PROZAC) 20 MG capsule Take 20 mg by mouth daily.       . folic acid (FOLVITE) 1 MG tablet Take 1 mg by mouth daily.      Marland Kitchen gabapentin (NEURONTIN) 400 MG capsule Take 400 mg by mouth 3 (three) times daily.      . Multiple Vitamin (MULTIVITAMIN) tablet Take 1 tablet by mouth 2 (two) times daily.       . phenol-menthol (CEPASTAT) 14.5 MG lozenge Place 1 lozenge inside cheek as needed for sore throat.  100 tablet   0  . potassium chloride SA (K-DUR,KLOR-CON) 20 MEQ tablet Take 20 mEq by mouth daily.       . predniSONE (DELTASONE) 20 MG tablet Take 2 tablets (40 mg total) by mouth daily with breakfast. Only on days not receiving Plasma exchange.  20 tablet  0  . QUEtiapine (SEROQUEL) 25 MG tablet Take 25 mg by mouth at bedtime.      Marland Kitchen tiotropium (SPIRIVA) 18 MCG inhalation capsule Place 18 mcg into inhaler and inhale daily.         Facility-Administered Medications Ordered in Other Encounters  Medication Dose Route Frequency Provider Last Rate Last Dose  . sodium chloride 0.9 % injection 10 mL  10 mL Intracatheter PRN Annia Belt, MD   10 mL at 07/19/13 1740    Allergies: Allergies as of 08/19/2013 - Review Complete 08/19/2013  Allergen Reaction Noted  . Cefuroxime axetil Other (See Comments) 11/05/2012  . Adhesive [tape] Other (See Comments) 05/05/2011  . Other Other (See Comments) 03/16/2011  . Sulfa antibiotics Hives 03/16/2011  . Sulfa drugs cross reactors Other (See Comments) 03/16/2011   Past Medical History  Diagnosis Date  . Septicemia 03/2011  . Normal echocardiogram 03/15/11  . History of TTP (thrombotic thrombocytopenic purpura)   . Acute delirium   . Bacteremia 03/2011  . COPD (chronic obstructive pulmonary disease)   . Blood transfusion   . Anemia   . Arthritis   . Chronic back pain   . Spinal stenosis, lumbar   . Blood dyscrasia   . Depression     "mild"  . Tobacco abuse 05/10/2013  . Oral thrush 08/08/2013  . Hypokalemia 08/08/2013    Steroid related  . 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 Bronchitis, chronic obstructive w acute bronchitis 08/12/2013   Past Surgical History  Procedure Laterality Date  . Cesarean section  10/09/1976  . Tonsillectomy    . Tubal ligation    . Cataract extraction w/ intraocular lens  implant, bilateral  ~ 2010  . Eye surgery    . Lumbar laminectomy/decompression microdiscectomy  03/16/2011     Procedure: LUMBAR LAMINECTOMY/DECOMPRESSION MICRODISCECTOMY;  Surgeon: Elaina Hoops;  Location: [facility];  Service: Neurosurgery;  Laterality: N/A;  . Tee without cardioversion  03/21/2011    Procedure: TRANSESOPHAGEAL ECHOCARDIOGRAM (TEE);  Surgeon: Laverda Page;  Location[facility] END[facility]COPY;  Service: Cardiovascular;  Laterality: N/A;  TEE for vegetations  . Insertion of dialysis catheter Left 07/12/2013    Procedure: INSERTION OF DIALYSIS CATHETER   with Ultrasound;  Surgeon: Rosetta Posner, MD;  Location: [facility];  Service: Vascular;  Laterality: Left;   No family history on file. History   Social History  . Marital Status: Married  Spouse Name: N/A    Number of Children: N/A  . Years of Education: N/A   Occupational History  . Not on file.   Social History Main Topics  . Smoking status: Current Every Day Smoker -- 2.50 packs/day for 55 years    Types: Cigarettes    Last Attempt to Quit: 03/09/2011  . Smokeless tobacco: Never Used  . Alcohol Use: Yes     Comment: very seldom.  . Drug Use: No  . Sexual Activity: Not on file   Other Topics Concern  . Not on file   Social History Narrative  . No narrative on file    Review of Systems: Comprehensive 10 point ROS completed and was only significant for those pertinent positives and negatives noted in the HPI.  Physical Exam: Blood pressure 100/38, pulse 102, temperature 101 F (38.3 C), temperature source Rectal, resp. rate 20, SpO2 96.00%. General: somnolent, though easily arousable to verbal stimulation; eyes closed; NAD HEENT: pupils equal round and reactive to light, vision grossly intact, oropharynx nonerythematous, but with yellow/white coating to back of throat; tongue normal appearing; MMM Neck: supple Lungs: mild increased WOB with diffuse expiratory wheezing and bibasilar crackles (R>L) and some scattered rhonchi throughout lower lung fields bilaterally  Heart: regular rate and rhythm, no murmurs, gallops, or  rubs Abdomen: soft, though somewhat distended, tympanic on percussion; non-tender, non-distended, normal bowel sounds Extremities: 2+ DP pulses bilaterally, 1+ pitting edema to BLE to mid shin w/ purpuric changes to R foot (pt kicked a chair per husband) Neurologic: somnolent though awakens easily to verbal stimulation, alert & oriented X3, cranial nerves II-XII grossly intact, strength grossly intact, sensation intact to light touch  Lab results: Basic Metabolic Panel:  Recent Labs  08/17/13 1100 08/17/13 1217 08/19/13 1214  NA 138 141 142  K 3.3* 3.2* 4.1  CL 100 100 105  CO2 26  --  25  GLUCOSE 82 87 108*  BUN 27* 27* 27*  CREATININE 0.82 1.00 1.09  CALCIUM 8.7  --  8.3*   Liver Function Tests:  Recent Labs  08/17/13 1100 08/19/13 1214  AST 24 31  ALT 24 31  ALKPHOS 64 57  BILITOT 0.3 0.4  PROT 6.0 5.6*  ALBUMIN 3.1* 3.0*   CBC:  Recent Labs  08/17/13 1100 08/17/13 1217 08/19/13 1214  WBC 7.0  --  8.2  NEUTROABS 5.9  --  6.7  HGB 7.8* 8.5* 7.9*  HCT 24.8* 25.0* 25.1*  MCV 97.3  --  98.0  PLT 131*  --  146*   Cardiac Enzymes:  Recent Labs  08/19/13 1238  TROPONINI <0.30   Anemia Panel:  Recent Labs  08/17/13 1100  RETICCTPCT 4.3*   Urinalysis:  Recent Labs  08/19/13 1225  COLORURINE YELLOW  LABSPEC 1.018  PHURINE 7.0  GLUCOSEU NEGATIVE  HGBUR SMALL*  BILIRUBINUR NEGATIVE  KETONESUR NEGATIVE  PROTEINUR NEGATIVE  UROBILINOGEN 1.0  NITRITE NEGATIVE  LEUKOCYTESUR NEGATIVE   Misc. Labs: Lactate 1.1 LDH 356 (high)  Arterial Blood Gas result:  pO2 60; pCO2 41; pH 7.34;  HCO3 24  Imaging results:  Dg Chest Port 1 View  (if Code Sepsis Called)  08/19/2013   CLINICAL DATA:  Fever, shortness of breath, weakness, confusion, history COPD, smoking  EXAM: PORTABLE CHEST - 1 VIEW  COMPARISON:  Portable exam 1229 hr compared to 08/12/2013  FINDINGS: Left subclavian dual-lumen central venous catheter unchanged distal tip projecting over  cavoatrial junction.  Upper normal heart size.  Calcified tortuous  aorta.  Mediastinal contours and pulmonary vascularity normal.  Bronchitic changes and right basilar atelectasis.  No gross infiltrate, pleural effusion or pneumothorax.  Osseous demineralization.  IMPRESSION: Mild bronchitic changes with right basilar atelectasis.   Electronically Signed   By: Lavonia Dana M.D.   On: 08/19/2013 12:52   Assessment & Plan by Problem:  # 2/4 SIRS with unknown infectious source, suspect bacteremia 2/2 subclavian line (likely severe sepsis vs septic shock): Patient is immunosuppressed (chronic steroids) with fever to 101 rectally, tachycardia, and soft blood pressure despite avid fluid resuscitation in the ED. No leukocytosis. Lactic acid wnl. ABG reveals normal acid/base status. Patient with vascular line to L subclavian for frequent plasmapheresis for treatment of TTP. Patient presented with similar s/s last week and was treated with 3 days of vancomycin (thought not to be bacteremic at this time as BCx were no growth). She likely needs a longer course of antibiotics as I suspect she is now septic, likely catheter associated bacteremia. Will continue vanc/zosyn that were started in the ED for empiric treatment of presumed bacteremia. Blood cultures were drawn in ED prior to abx therapy. PCCM was consulted and did not think she met criteria for ICU placement at this time. No other focus of infection suspected: CXR does not show signs of PNA. UA is clean. -admit to SDU w/ low threshold to call PCCM again -s/p 4L NS in ED -Hb 7.9 and pt hypotensive so will also give 1u pRBCs -post transfusion CBC  -CMP in AM  # TTP: Patient followed by Dr. Beryle Beams, who saw patient with [place] down in the ED. He would like to hold off on plasmapharesis today until she is more stable. PLT count is stable at 146K (up from 4/18 when it was 131K).  She is on chronic steroid therapy (prednisone 31m IV with each plasmapheresis and  prednisone 43mPO each off day).  S/p solumedrol 12564mV in ED. -consider giving stress dose of steroids -talk with Dr. GraBeryle Beamsgarding when to restart plasmapheresis  -CBC in AM -keeping patient NPO overnight until mental status improves, holding home medications  # COPD: Pt with audible wheezing and some scattered rhonchi as well as nonproductive cough on exam. Per daughter, cough is improving. She received solumedrol 125m20m once.  -duonebs prn  # Contusion to R foot:  Large contusion to R foot. Per husband, patient kicked a chair and this area is actually improving. Will need to rule out fracture. Avoiding narcotics given somnolence on exam.  -R foot xray  # VTE: lovenox  # Diet: NPO  Code status: full  Dispo: Disposition is deferred at this time, awaiting improvement of current medical problems. Anticipated discharge in approximately 2 day(s).   The patient does have a current PCP (MarKirk Ruths) and does not need [facility] after discharge.  The patient does not have transportation limitations that hinder transportation to clinic appointments.  Signed: RachRebecca Eaton 08/19/2013, 2:50 PM

## 2013-08-19 NOTE — Progress Notes (Signed)
Patient ID: HOLLE SPRICK, female   DOB: [birth year], 73 y.o.   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 She had an uncommon to see me this morning. Husband called to say that she was sick again running fever with associated profound weakness. He was advised to call an ambulance and have her brought to the emergency department at [facility] if he was unable to get her into the car to be seen at the clinic. She was just admitted from the clinic last week and was in the hospital for 3 days with similar complaints of high fever and weakness. Initial temperature 102. Increasing nonproductive cough. No focal findings on exam except rales at the right lung base. No infiltrates on chest x-ray. No tenderness at site of vascular catheter in the left subclavian position. She received parenteral vancomycin pending culture results which were all negative. Symptoms improved. I saw her as an outpatient on Friday when she was in the dialysis unit getting a plasma exchange. She felt much better. Cultures were negative up to that point. There were no new localizing findings on exam. I will see her later today when she arrives. Medicine resident notified of probable admission. My cell phone is [phone]

## 2013-08-19 NOTE — Consult Note (Signed)
Hematology consultation note: 73 year old woman currently under active treatment for her third relapse of TTP. Initial diagnosis in January 2005 when she presented with neurologic signs and symptoms. She achieved a remission with plasma exchange and steroids. She relapsed in August 2006. No further neurologic symptoms however. She again achieved remission with plasma exchange and steroids and was then given consolidation with Rituxan anti-B cell antibody. She relapsed again in October 2011 and was treated with steroids, plasma exchange, and Rituxan. She was briefly treated with Imuran which was stopped when she presented with severe back pain in November 2012 and was found to have an epidural abscess requiring emergency decompressive laminectomy at L1-L4, evacuation of the abscess, and prolonged parenteral antibiotics. Blood cultures grew enterococcus. 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 She did well until February of this year when she developed a slowly falling platelet count and rising LDH. ADAM-TS enzyme was obtained and was undetectable consistent with relapsed TTP. She was not anemic initially. Bilirubin was never elevated. I treated her with steroids and Rituxan. However after one week, platelet count continued to fall and she was admitted to the hospital on March 13 to begin plasma exchange. She received a total of 8 initial exchanges and had a slow and steady rise in her platelet count up to a peak of 157,000. Plasma Exchange was discontinued. Steroids were continued at a dose of prednisone 60 mg daily. Unfortunately, after coming off plasma exchange for just one week, platelet count began to fall again and plasma exchange was resumed on April 3. She has had almost daily plasma exchanges since that time. Platelet count up to 123,000 by April 13 but then plateaued at 115,000. I begin to taper her steroids down from 80 mg daily with each plasma exchange to 60 mg and last week 40  mg on the days that she was not going to get an exchange. I began Imuran 100 mg daily 2 weeks ago. I continued Rituxan and she received a total of 4 doses over the last month to complete the planned course. She presented for a routine clinic followup last Monday with fever to 102 and progressive nonproductive cough with no pulmonary infiltrates. She was admitted for observation. There were no focal findings on exam and specifically her vascular catheter site did not appear to be obviously infected. She was covered with vancomycin until cultures were mature. Cultures remained sterile at 72 hours. Fevers resolved. Cough improved. Respiratory virus panel was negative. She was discharged. I saw her in the dialysis unit on Friday, April 17. She did improve significantly. Her lungs were clear. Cough almost completely gone. She had a scheduled followup with me this morning but has been called to say that starting at 11 PM last night she again developed profound weakness and fevers up to 102. He was not able to get her into a car this morning and advised him to have his wife transported to the emergency department for further evaluation. While in the emergency department her blood pressure has drifted down to current value of 390 systolic.   Past medical history remarkable for obstructive airway disease and a chronic cigarette smoker who will not stop smoking.  Exam: Patient Vitals for the past 24 hrs:  BP Temp Temp src Pulse Resp SpO2  08/19/13 1500 104/43 mmHg - - 98 22 96 %  08/19/13 1445 80/68 mmHg - - 99 20 96 %  08/19/13 1440 100/38 mmHg - - 102 20  96 %  08/19/13 1430 101/36 mmHg - - 101 21 100 %  08/19/13 1415 97/42 mmHg - - 107 16 92 %  08/19/13 1400 92/37 mmHg - - 103 22 93 %  08/19/13 1345 101/42 mmHg - - 102 18 93 %  08/19/13 1330 102/39 mmHg - - 107 21 93 %  08/19/13 1329 104/43 mmHg - - 107 21 91 %  08/19/13 1315 104/43 mmHg - - 101 22 94 %  08/19/13 1300 115/47 mmHg - - 99 20 97 %   08/19/13 1255 115/46 mmHg - - 104 15 95 %  08/19/13 1245 109/42 mmHg - - 96 20 94 %  08/19/13 1225 - 101 F (38.3 C) Rectal - - -  08/19/13 1215 105/47 mmHg - - 105 25 88 %    She is somnolent but easily arousable and follows all commands appropriately There is periorbital edema. Pupils equal round and reactive to light. Pharynx with dried secretions in the posterior pharynx but no gross exudate. Left subclavian vascular catheter no tenderness at entry site. Surrounding ecchymosis which is resolving. Lung exam: Coarse rales bilaterally at the bases scattered expiratory wheezes Cardiac exam: Regular rhythm rapid rate no murmur no gallop no rub Abdominal exam: Moderately distended, tympanitic, normal bowel sounds, nontender, no mass, no organomegaly Extremities: Trace to 1+ edema less than on my exam one week ago but slightly increased compared my exam of 3 days ago Neurologic: Somnolent but arousable. Cranial nerves grossly normal. Motor strength 5 over 5. Reflexes 1+ symmetric upper and lower.  labs: see chart :  Platelets Improved compare with recent baseline currently 146,000 Current meds insulin 1.1 White count 8200 with 81% neutrophils Hemoglobin has drifted down currently 7.9 LDH remains elevated at 356 with a normal bilirubin and normal transaminases  Impression: #1. Septicemia. At this point I remain concerned that the source of her sepsis is her left subclavian vascular catheter Recommend: She is immunocompromised, on chronic steroids and other immunosuppressant drugs, I would recommend broad spectrum antibiotic coverage to include gram-negative coverage as well as staph coverage. Fluid resuscitation as indicated. I would monitor her response to parenteral antibiotics over the next 48 hours. If any persistent fever then remove the vascular catheter. Stress dose steroids. ( she has been receiving Solu-Medrol daily with her cosmetics change and received 60 mg IV on Saturday, 40 mg  by mouth on Sunday.). I would give her a 6 today she did not am IV dose today.  #2. Multiply and relapsed TTP I will hold plasma exchange today until condition is more stable. Unfortunately she is septic, I would anticipate a fall in her platelet count again.  #3. Obstructive airway disease. She has been relatively asymptomatic until now. She gets dyspnea dyspnea on exertion. Some of her recent development of peripheral edema maybe secondary to incipient right heart failure. Pro bnp last week moderately elevated at 2298. I would consider getting an echocardiogram for further evaluation.  #4. Candida pharyngitis Will continue Diflucan as long as she is on steroids.

## 2013-08-19 NOTE — Progress Notes (Signed)
CCM Interim Progress Note.  Patient concerned about missing prozac dose - reports she does very poorly when she misses this medication.  She has been NPO due to AMS.  Seems to have improved mentation.    Restart prozac 20mg  at 10a, this will allow for [place] to continue to monitor her respiratory status overnight

## 2013-08-19 NOTE — ED Notes (Signed)
NOTIFIED DR. Ashok Cordia OF PATIENTS LAB RESULTS CG4+ LACTIC ACID ,08/19/2013.

## 2013-08-19 NOTE — ED Provider Notes (Signed)
CSN: [id number]     Arrival date & time 08/19/13  1201 History   First MD Initiated Contact with Patient 08/19/13 1204     Chief Complaint  Patient presents with  . Shortness of Breath  . Fever     (Consider location/radiation/quality/duration/timing/severity/associated sxs/prior Treatment) Patient is a 73 y.o. female presenting with shortness of breath and fever. The history is provided by the patient and the EMS personnel.  Shortness of Breath Associated symptoms: cough and fever   Associated symptoms: no abdominal pain, no chest pain, no headaches, no neck pain, no rash, no sore throat and no vomiting   Fever Associated symptoms: cough   Associated symptoms: no chest pain, no chills, no confusion, no diarrhea, no dysuria, no headaches, no rash, no sore throat and no vomiting   pt with hx copd, TTP, presents after being noted by family to appear generally weak and short of breath today. Pt recently treated in hospital for TTP and 'respiratory infection'.  Pt v poor historian-  States sometime after d/c to home again began to feel worse. Pt notes general weakness and malaise, persistent non productive cough, and dyspnea. Denies chest pain. Fever 101 today. Denies chills/sweats. States compliant w normal meds. Symptoms persistent, constant since onset and slowly worsening.      Past Medical History  Diagnosis Date  . Septicemia 03/2011  . Normal echocardiogram 03/15/11  . History of TTP (thrombotic thrombocytopenic purpura)   . Acute delirium   . Bacteremia 03/2011  . COPD (chronic obstructive pulmonary disease)   . Blood transfusion   . Anemia   . Arthritis   . Chronic back pain   . Spinal stenosis, lumbar   . Blood dyscrasia   . Depression     "mild"  . Tobacco abuse 05/10/2013  . Oral thrush 08/08/2013  . Hypokalemia 08/08/2013    Steroid related  . 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 Bronchitis, chronic obstructive w acute bronchitis  08/12/2013   Past Surgical History  Procedure Laterality Date  . Cesarean section  10/09/1976  . Tonsillectomy    . Tubal ligation    . Cataract extraction w/ intraocular lens  implant, bilateral  ~ 2010  . Eye surgery    . Lumbar laminectomy/decompression microdiscectomy  03/16/2011    Procedure: LUMBAR LAMINECTOMY/DECOMPRESSION MICRODISCECTOMY;  Surgeon: Elaina Hoops;  Location: [facility] [facility]URO ORS;  Service: Neurosurgery;  Laterality: N/A;  . Tee without cardioversion  03/21/2011    Procedure: TRANSESOPHAGEAL ECHOCARDIOGRAM (TEE);  Surgeon: Laverda Page;  Location: [facility] ENDOSCOPY;  Service: Cardiovascular;  Laterality: N/A;  TEE for vegetations  . Insertion of dialysis catheter Left 07/12/2013    Procedure: INSERTION OF DIALYSIS CATHETER   with Ultrasound;  Surgeon: Rosetta Posner, MD;  Location: [place];  Service: Vascular;  Laterality: Left;   No family history on file. History  Substance Use Topics  . Smoking status: Current Every Day Smoker -- 2.50 packs/day for 55 years    Types: Cigarettes    Last Attempt to Quit: 03/09/2011  . Smokeless tobacco: Never Used  . Alcohol Use: Yes     Comment: very seldom.   OB History   Grav Para Term Preterm Abortions TAB SAB Ect Mult Living                 Review of Systems  Constitutional: Positive for fever. Negative for chills.  HENT: Negative for sore throat.   Eyes:  Negative for redness.  Respiratory: Positive for cough and shortness of breath.   Cardiovascular: Negative for chest pain and leg swelling.  Gastrointestinal: Negative for vomiting, abdominal pain and diarrhea.  Genitourinary: Negative for dysuria and flank pain.  Musculoskeletal: Negative for back pain and neck pain.  Skin: Negative for rash.  Neurological: Positive for weakness. Negative for headaches.  Hematological:       Hx TTP, recent plasma/exchange transfusion  Psychiatric/Behavioral: Negative for confusion.      Allergies  Cefuroxime axetil; Adhesive; Other;  Sulfa antibiotics; and Sulfa drugs cross reactors  Home Medications   Prior to Admission medications   Medication Sig Start Date End Date Taking? Authorizing Provider  azaTHIOprine (IMURAN) 50 MG tablet Take 100 mg by mouth daily.    Historical Provider, MD  cholecalciferol (VITAMIN D) 400 UNITS TABS Take 400 Units by mouth.    Historical Provider, MD  cyanocobalamin 100 MCG tablet Take 1,000 mcg by mouth daily.    Historical Provider, MD  fluconazole (DIFLUCAN) 100 MG tablet Take 100 mg by mouth daily.    Historical Provider, MD  FLUoxetine (PROZAC) 20 MG capsule Take 20 mg by mouth daily.     Kirk Ruths, MD  folic acid (FOLVITE) 1 MG tablet Take 1 mg by mouth daily.    Historical Provider, MD  gabapentin (NEURONTIN) 400 MG capsule Take 400 mg by mouth 3 (three) times daily.    Historical Provider, MD  Multiple Vitamin (MULTIVITAMIN) tablet Take 1 tablet by mouth 2 (two) times daily.     Historical Provider, MD  nicotine (NICODERM CQ - DOSED IN MG/24 HOURS) 21 mg/24hr patch Place 1 patch (21 mg total) onto the skin daily. 07/17/13   Annia Belt, MD  oxyCODONE-acetaminophen (PERCOCET/ROXICET) 5-325 MG per tablet Take 2 tablets by mouth every 6 (six) hours as needed for moderate pain or severe pain. 07/17/13   Annia Belt, MD  phenol-menthol (CEPASTAT) 14.5 MG lozenge Place 1 lozenge inside cheek as needed for sore throat. 08/14/13   Joni Reining, DO  predniSONE (DELTASONE) 20 MG tablet Take 2 tablets (40 mg total) by mouth daily with breakfast. Only on days not receiving Plasma exchange. 08/14/13   Joni Reining, DO  QUEtiapine (SEROQUEL) 25 MG tablet Take 25 mg by mouth at bedtime.    Historical Provider, MD  tiotropium (SPIRIVA) 18 MCG inhalation capsule Place 18 mcg into inhaler and inhale daily.      Historical Provider, MD  traMADol (ULTRAM) 50 MG tablet Take 50-100 mg by mouth every 6 (six) hours as needed for moderate pain.    Historical Provider, MD   BP 105/47   Pulse 105  Temp(Src) 101 F (38.3 C) (Rectal)  Resp 25  SpO2 88% Physical Exam  Nursing note and vitals reviewed. Constitutional: She is oriented to person, place, and time. She appears well-developed and well-nourished. No distress.  HENT:  Head: Atraumatic.  Mouth/Throat: Oropharynx is clear and moist.  Eyes: Conjunctivae are normal. No scleral icterus.  Neck: Neck supple. No tracheal deviation present. No thyromegaly present.  No stiffness or rigidity  Cardiovascular: Regular rhythm, normal heart sounds and intact distal pulses.  Exam reveals no gallop and no friction rub.   No murmur heard. Pulmonary/Chest: Effort normal and breath sounds normal. No respiratory distress.  Left chest central venous catheter, without erythema, tenderness, purulent drainage, or other obvious sign of infection.  Abdominal: Soft. Normal appearance and bowel sounds are normal. She exhibits no distension and no mass.  There is no tenderness. There is no rebound and no guarding.  Genitourinary:  No cva tenderness  Musculoskeletal: She exhibits no edema.  Ecchymosis right foot (pt states is from hitting against rocking chair last week). Distal pulses palp.   Neurological: She is alert and oriented to person, place, and time.  Skin: Skin is warm and dry. No rash noted. She is not diaphoretic.  Psychiatric: She has a normal mood and affect.    ED Course  Procedures (including critical care time)   Results for orders placed during the hospital encounter of 08/19/13  CBC WITH DIFFERENTIAL      Result Value Ref Range   WBC 8.2  4.0 - 10.5 K/uL   RBC 2.56 (*) 3.87 - 5.11 MIL/uL   Hemoglobin 7.9 (*) 12.0 - 15.0 g/dL   HCT 25.1 (*) 36.0 - 46.0 %   MCV 98.0  78.0 - 100.0 fL   MCH 30.9  26.0 - 34.0 pg   MCHC 31.5  30.0 - 36.0 g/dL   RDW 15.4  11.5 - 15.5 %   Platelets 146 (*) 150 - 400 K/uL   Neutrophils Relative % 81 (*) 43 - 77 %   Neutro Abs 6.7  1.7 - 7.7 K/uL   Lymphocytes Relative 16  12 - 46 %    Lymphs Abs 1.3  0.7 - 4.0 K/uL   Monocytes Relative 3  3 - 12 %   Monocytes Absolute 0.3  0.1 - 1.0 K/uL   Eosinophils Relative 0  0 - 5 %   Eosinophils Absolute 0.0  0.0 - 0.7 K/uL   Basophils Relative 0  0 - 1 %   Basophils Absolute 0.0  0.0 - 0.1 K/uL  COMPREHENSIVE METABOLIC PANEL      Result Value Ref Range   Sodium 142  137 - 147 mEq/L   Potassium 4.1  3.7 - 5.3 mEq/L   Chloride 105  96 - 112 mEq/L   CO2 25  19 - 32 mEq/L   Glucose, Bld 108 (*) 70 - 99 mg/dL   BUN 27 (*) 6 - 23 mg/dL   Creatinine, Ser 1.09  0.50 - 1.10 mg/dL   Calcium 8.3 (*) 8.4 - 10.5 mg/dL   Total Protein 5.6 (*) 6.0 - 8.3 g/dL   Albumin 3.0 (*) 3.5 - 5.2 g/dL   AST 31  0 - 37 U/L   ALT 31  0 - 35 U/L   Alkaline Phosphatase 57  39 - 117 U/L   Total Bilirubin 0.4  0.3 - 1.2 mg/dL   GFR calc non Af Amer 50 (*) >90 mL/min   GFR calc Af Amer 58 (*) >90 mL/min  URINALYSIS, ROUTINE W REFLEX MICROSCOPIC      Result Value Ref Range   Color, Urine YELLOW  YELLOW   APPearance CLEAR  CLEAR   Specific Gravity, Urine 1.018  1.005 - 1.030   pH 7.0  5.0 - 8.0   Glucose, UA NEGATIVE  NEGATIVE mg/dL   Hgb urine dipstick SMALL (*) NEGATIVE   Bilirubin Urine NEGATIVE  NEGATIVE   Ketones, ur NEGATIVE  NEGATIVE mg/dL   Protein, ur NEGATIVE  NEGATIVE mg/dL   Urobilinogen, UA 1.0  0.0 - 1.0 mg/dL   Nitrite NEGATIVE  NEGATIVE   Leukocytes, UA NEGATIVE  NEGATIVE  LACTATE DEHYDROGENASE      Result Value Ref Range   LDH 356 (*) 94 - 250 U/L  TROPONIN I      Result Value Ref  Range   Troponin I <0.30  <0.30 ng/mL  URINE MICROSCOPIC-ADD ON      Result Value Ref Range   Squamous Epithelial / LPF FEW (*) RARE   WBC, UA 3-6  <3 WBC/hpf   RBC / HPF 3-6  <3 RBC/hpf   Bacteria, UA FEW (*) RARE  I-STAT CG4 LACTIC ACID, ED      Result Value Ref Range   Lactic Acid, Venous 1.11  0.5 - 2.2 mmol/L   Dg Chest 2 View  08/12/2013   CLINICAL DATA:  Cough and fever  EXAM: CHEST  2 VIEW  COMPARISON:  July 12, 2013  FINDINGS:  Central catheter tip is at cavoatrial junction. No pneumothorax. There is underlying emphysematous change. There is no edema or consolidation. The heart size is normal. The pulmonary vascularity reflects underlying emphysematous change.  IMPRESSION: Underlying emphysematous change. No edema or consolidation. No pneumothorax.   Electronically Signed   By: Lowella Grip M.D.   On: 08/12/2013 12:54   Dg Chest Port 1 View  (if Code Sepsis Called)  08/19/2013   CLINICAL DATA:  Fever, shortness of breath, weakness, confusion, history COPD, smoking  EXAM: PORTABLE CHEST - 1 VIEW  COMPARISON:  Portable exam 1229 hr compared to 08/12/2013  FINDINGS: Left subclavian dual-lumen central venous catheter unchanged distal tip projecting over cavoatrial junction.  Upper normal heart size.  Calcified tortuous aorta.  Mediastinal contours and pulmonary vascularity normal.  Bronchitic changes and right basilar atelectasis.  No gross infiltrate, pleural effusion or pneumothorax.  Osseous demineralization.  IMPRESSION: Mild bronchitic changes with right basilar atelectasis.   Electronically Signed   By: Lavonia Dana M.D.   On: 08/19/2013 12:52       Date: 08/19/2013  Rate: 96  Rhythm: normal sinus rhythm  QRS Axis: normal  Intervals: normal  ST/T Wave abnormalities: nonspecific T wave changes  Conduction Disutrbances:none  Narrative Interpretation:   Old EKG Reviewed: unchanged    MDM  Iv ns. Labs. Continuous pulse ox and monitor. Room air o2 sat 82%, on 4 liters sats 88-89%.   Ecg. Cxr.  Albuterol and atrovent neb.  Wheezing improved but persists. Alb and atrovent neb. Solumedrol iv.   Reviewed nursing notes and prior charts for additional history.   Dr Beryle Beams present.  He states pt in hospital last week, recd just 3 days iv vanc.  pts cvl in for 1+month, although no gross sign of infection, possible source. Blood cultures sent.  Recheck bp low 88/50.  Iv ns boluses.  Iv vanc and zosyn.  Given  resp symptoms, will cover for possibe hcap, possible line infxn.   Recheck, breathing easier. Pulse ox 93%.   bp improved w ns boluses.  CRITICAL CARE  RE: hypoxia/resp distress, fevers, weakness, hypotension, sepsis, copd, TTP, anemia,  Performed by: Mirna Mires Total critical care time: 40 Critical care time was exclusive of separately billable procedures and treating other patients. Critical care was necessary to treat or prevent imminent or life-threatening deterioration. Critical care was time spent personally by me on the following activities: development of treatment plan with patient and/or surrogate as well as nursing, discussions with consultants, evaluation of patient's response to treatment, examination of patient, obtaining history from patient or surrogate, ordering and performing treatments and interventions, ordering and review of laboratory studies, ordering and review of radiographic studies, pulse oximetry and re-evaluation of patient's condition.Marland Kitchen      Mirna Mires, MD 08/19/13 1426

## 2013-08-19 NOTE — Progress Notes (Signed)
PCCM Progress Note:  Asked by Warren Lacy MD to see pt due to concerns regarding increased WOB and worsening AMS.  On my arrival to pt's room, pt's bed sheets were being changed due to pt being diaphoretic.  She had mild increase in WOB from RN moving/rolling pt.  SpO2 dropped to 88%.  O2 increased from 4L to 6L.  Once pt stopped moving, SpO2 increased to 96%. She was able to tell me where she is and have a conversation with me.  States that she is doing "better".   Denies chest pain, SOB, worsening mental status.  She feels as if she needs to urinate and is requesting a bed pan. BP 112/48 (note, pt's baseline SBP 95 - 110).  Pt is full code.  We will go ahead and move pt to ICU for closer observation.  If resp status declines, low threshold for intubation. Note pt has left [place] HD cath; however, there is a concern for line infection. If SBP drops and pressors are required, will have to place femoral line given no limited access as bilateral IJ's and right [place]lis are obstructed.   If femoral line is placed overnight, would need to consult vascular in the AM for more appropriate central IV access.  Warren Lacy MD notified.  Orders entered for transfer to ICU.   Montey Hora, [facility] Pgr: [phone]  or [phone]

## 2013-08-20 ENCOUNTER — Inpatient Hospital Stay (HOSPITAL_COMMUNITY): Payer: Medicare Other

## 2013-08-20 DIAGNOSIS — R509 Fever, unspecified: Secondary | ICD-10-CM

## 2013-08-20 DIAGNOSIS — Z862 Personal history of diseases of the blood and blood-forming organs and certain disorders involving the immune mechanism: Secondary | ICD-10-CM

## 2013-08-20 LAB — COMPREHENSIVE METABOLIC PANEL
ALT: 28 U/L (ref 0–35)
AST: 24 U/L (ref 0–37)
Albumin: 2.7 g/dL — ABNORMAL LOW (ref 3.5–5.2)
Alkaline Phosphatase: 53 U/L (ref 39–117)
BUN: 22 mg/dL (ref 6–23)
CALCIUM: 7.6 mg/dL — AB (ref 8.4–10.5)
CHLORIDE: 106 meq/L (ref 96–112)
CO2: 24 meq/L (ref 19–32)
Creatinine, Ser: 0.76 mg/dL (ref 0.50–1.10)
GFR calc Af Amer: >90 mL/min (ref >90)
GFR calc non Af Amer: 83 mL/min — ABNORMAL LOW (ref >90)
Glucose, Bld: 183 mg/dL — ABNORMAL HIGH (ref 70–99)
Potassium: 4.2 mEq/L (ref 3.7–5.3)
SODIUM: 143 meq/L (ref 137–147)
Total Bilirubin: 0.4 mg/dL (ref 0.3–1.2)
Total Protein: 5.8 g/dL — ABNORMAL LOW (ref 6.0–8.3)

## 2013-08-20 LAB — URINE CULTURE: Colony Count: >=100000

## 2013-08-20 LAB — HEMOGLOBIN A1C
Hgb A1c MFr Bld: 5.8 % — ABNORMAL HIGH (ref <5.7)
Mean Plasma Glucose: 120 mg/dL — ABNORMAL HIGH (ref <117)

## 2013-08-20 LAB — CBC
HEMATOCRIT: 25 % — AB (ref 36.0–46.0)
Hemoglobin: 7.7 g/dL — ABNORMAL LOW (ref 12.0–15.0)
MCH: 30.2 pg (ref 26.0–34.0)
MCHC: 30.8 g/dL (ref 30.0–36.0)
MCV: 98 fL (ref 78.0–100.0)
Platelets: 149 10*3/uL — ABNORMAL LOW (ref 150–400)
RBC: 2.55 MIL/uL — ABNORMAL LOW (ref 3.87–5.11)
RDW: 15.4 % (ref 11.5–15.5)
WBC: 9.6 10*3/uL (ref 4.0–10.5)

## 2013-08-20 LAB — GLUCOSE, CAPILLARY
GLUCOSE-CAPILLARY: 192 mg/dL — AB (ref 70–99)
GLUCOSE-CAPILLARY: 249 mg/dL — AB (ref 70–99)

## 2013-08-20 MED ORDER — INSULIN ASPART 100 UNIT/ML ~~LOC~~ SOLN
0.0000 [IU] | Freq: Three times a day (TID) | SUBCUTANEOUS | Status: DC
  Administered 2013-08-20 – 2013-08-21 (×2): 2 [IU] via SUBCUTANEOUS
  Administered 2013-08-21: 1 [IU] via SUBCUTANEOUS
  Administered 2013-08-22: 18:00:00 via SUBCUTANEOUS
  Administered 2013-08-22 – 2013-08-23 (×2): 1 [IU] via SUBCUTANEOUS
  Administered 2013-08-23: 3 [IU] via SUBCUTANEOUS
  Administered 2013-08-24: 5 [IU] via SUBCUTANEOUS
  Administered 2013-08-24: 2 [IU] via SUBCUTANEOUS
  Administered 2013-08-25: 1 [IU] via SUBCUTANEOUS
  Administered 2013-08-26: 3 [IU] via SUBCUTANEOUS

## 2013-08-20 MED ORDER — FLUOXETINE HCL 20 MG PO CAPS
20.0000 mg | ORAL_CAPSULE | Freq: Every day | ORAL | Status: DC
  Administered 2013-08-20 – 2013-08-26 (×7): 20 mg via ORAL
  Filled 2013-08-20 (×7): qty 1

## 2013-08-20 MED ORDER — WHITE PETROLATUM GEL
Status: AC
  Administered 2013-08-20: 0.2
  Filled 2013-08-20: qty 5

## 2013-08-20 MED ORDER — FLUCONAZOLE 100 MG PO TABS
100.0000 mg | ORAL_TABLET | Freq: Every day | ORAL | Status: DC
  Administered 2013-08-20 – 2013-08-26 (×7): 100 mg via ORAL
  Filled 2013-08-20 (×7): qty 1

## 2013-08-20 MED ORDER — INSULIN ASPART 100 UNIT/ML ~~LOC~~ SOLN
0.0000 [IU] | Freq: Every day | SUBCUTANEOUS | Status: DC
  Administered 2013-08-20: 2 [IU] via SUBCUTANEOUS

## 2013-08-20 MED ORDER — NICOTINE 14 MG/24HR TD PT24
14.0000 mg | MEDICATED_PATCH | Freq: Every day | TRANSDERMAL | Status: DC
  Administered 2013-08-21: 14 mg via TRANSDERMAL
  Filled 2013-08-20 (×2): qty 1

## 2013-08-20 MED ORDER — PREDNISONE 20 MG PO TABS
40.0000 mg | ORAL_TABLET | Freq: Every day | ORAL | Status: DC
  Administered 2013-08-20 – 2013-08-26 (×7): 40 mg via ORAL
  Filled 2013-08-20 (×8): qty 2

## 2013-08-20 MED ORDER — IPRATROPIUM-ALBUTEROL 0.5-2.5 (3) MG/3ML IN SOLN
3.0000 mL | RESPIRATORY_TRACT | Status: DC | PRN
  Administered 2013-08-22 – 2013-08-23 (×2): 3 mL via RESPIRATORY_TRACT
  Filled 2013-08-20 (×2): qty 3

## 2013-08-20 MED ORDER — BIOTENE DRY MOUTH MT LIQD
15.0000 mL | Freq: Two times a day (BID) | OROMUCOSAL | Status: DC
  Administered 2013-08-20: 15 mL via OROMUCOSAL

## 2013-08-20 NOTE — Progress Notes (Signed)
Patient ID: [name], female   DOB: 07-20-1940, 73 y.o.   MRN: [id number] Critical Care assistance appreciated Stable over course of the day. Afebrile on current antibiotics. I will review labs/status in AM 4/22 and make decision re continue or discontinue plasma exchange/vascular catheter

## 2013-08-20 NOTE — Progress Notes (Signed)
Patient ID: [name], female   DOB: 1940/11/28, 73 y.o.   MRN: [id number] Condition has stabilized overnight Temp down Blood pressure up Oxygen saturation acceptable on 2 liters nasal O2 She is alert and oriented Lungs overall clear, minimal rales right base Heart regular rhythm no S3 Trace/1+ edema ankles - stable; ecchymosis right foot post minor trauma at home Platelet count stable - good - at 149,000 Hb transient increase to 8.5 after one unit transfusion - now back to previous baseline @ 7.7 Impression: #1. Sepsis - source not obvious but suspect vascular cath related.   Given prompt response to antibiotics, I would leave cath in place pending culture results. #2. Multiply relapsed TTP I will hold plasma exchange again today; continue to monitor lab daily Continue daily steroids prednisone 40 mg daily; IV dose given on days of plasma exchange If platelet count remains stable off plasma exchange, it will easier decision to remove vascular catheter. Discussed with medical residents

## 2013-08-20 NOTE — Progress Notes (Signed)
Pt transferred via wheelchair from 2M05 to 5W32. Report called to [facility] RN & pt's daughter Gwinda Passe notified of pt's new room number.

## 2013-08-20 NOTE — Progress Notes (Signed)
INTERNAL MEDICINE TEACHING SERVICE TRANSFER NOTE  IMTS to take over care 08/21/13  S: [name] is a 73 yo white female with a PMH of COPD, tobacco abuse, TTP.  She is currently receiving treatment for her third relapse of TTP by Dr Beryle Beams.  She was recently admitted and worked up for fever on 4/13 where a line infection was considered and empiric IV vancomycin was started. However her fever resolved and blood cultures were negative.  Vancomycin was discontinued and she returned home.  However on 4/20 she presented again to the ED after a 1 day history of drowsiness and fever.  In the ED she met SIRS criteria with a fever, tachycardia, and tachypnea.  She was mildly hypotensive.  She was started on Empiric Antibiotics with IV Vancomycin and Zosyn.  PCCM was consulted.  Later that night her daughter requested transfer from step down into the ICU due to drowsiness.  She remained afebrile and normotensive overnight in the ICU.  O: Blood pressure 131/54, pulse 95, temperature 98.2 F (36.8 C), temperature source Oral, resp. rate 27, height 5' 4.17" (1.63 m), weight 181 lb 14.1 oz (82.5 kg), SpO2 94.00%. Physical Exam    A/P:   Sepsis - Likely do to line infection, given that her previous fever improved with IV Vancomycin.  She seems to have improved clinically with IV Vanc/Zosyn.  Given her difficulty of access for plasma exchange Dr. Beryle Beams would prefer to leave cath in place if possible. - Continue Vanc/Zosyn - Follow Blood cultures    COPD (chronic obstructive pulmonary disease) - Bronchodilators pnr    TTP (thrombotic thrombocytopenic purpura) - Dr. Beryle Beams (Heme) following - Hold plasma exchange today, it platlet counts stable may be able to remove catheter. - Prednisone 85m on non plasma exchange days    Pulmonary edema - Consider 2D echo.  EJoni Reining DO 6:55 PM

## 2013-08-20 NOTE — Progress Notes (Signed)
PULMONARY / CRITICAL CARE MEDICINE   Name: [name] MRN: [id number] DOB: November 27, 1940    ADMISSION DATE:  08/19/2013 CONSULTATION DATE: 08/19/2013   REFERRING MD : Murlean Caller  PRIMARY SERVICE: PCCM  CHIEF COMPLAINT: Fever   BRIEF PATIENT DESCRIPTION: 73 y.o. F recently discharged 4/17 after being admitted for fever and weakness. Was treated with empiric IV Vanc on prior admission. Now presents with same symptoms. PCCM consulted for recs.   SIGNIFICANT EVENTS / STUDIES:  4/17 discharged after admission for fever/weakness, received IV vanc X 3.  4/20 presented with fever/malaise/mild hypotension; admitted for sepsis; transferred to ICU for worsening mentals status  LINES / TUBES:  HD left [place] 3/13 >>>   CULTURES:  Blood cultures 04/13 >>> neg Blood cultures 04/20 >>>  Urine culture 04/20 >>> neg  ANTIBIOTICS:  Vanc 4/20 >>>  Zosyn 4/20 >>>  INTERVAL HISTORY:   No acute events overnight.  The patient is afebrile, hemodynamically stable and has not required pressors.    VITAL SIGNS: Temp:  [97.5 F (36.4 C)-101 F (38.3 C)] 97.5 F (36.4 C) (04/21 0412) Pulse Rate:  [73-107] 102 (04/21 0700) Resp:  [15-25] 15 (04/21 0700) BP: (80-140)/(36-68) 140/60 mmHg (04/21 0700) SpO2:  [88 %-100 %] 96 % (04/21 0700) Weight:  [78.9 kg (173 lb 15.1 oz)-82.5 kg (181 lb 14.1 oz)] 82.5 kg (181 lb 14.1 oz) (04/20 2100)  HEMODYNAMICS:   VENTILATOR SETTINGS:  N/A   INTAKE / OUTPUT: Intake/Output     04/20 0701 - 04/21 0700 04/21 0701 - 04/22 0700   I.V. (mL/kg) 1000 (12.1)    IV Piggyback 212.5    Total Intake(mL/kg) 1212.5 (14.7)    Urine (mL/kg/hr) 1050    Total Output 1050     Net +162.5          Stool Occurrence 1 x     PHYSICAL EXAMINATION: General:  In bed in NAD Neuro:  AAO x 3, responding appropriately HEENT:  WNL Cardiovascular:  RRR Lungs:  Some wheezes Abdomen:  +BS, soft, NT Musculoskeletal:  Able to move all four extremities independently Extremities:  Trace  lower extremity edema Skin:  Large bruis on right foot  LABS:  CBC  Recent Labs Lab 08/17/13 1100 08/17/13 1217 08/19/13 1214 08/20/13 0317  WBC 7.0  --  8.2 9.6  HGB 7.8* 8.5* 7.9* 7.7*  HCT 24.8* 25.0* 25.1* 25.0*  PLT 131*  --  146* 149*   BMET  Recent Labs Lab 08/17/13 1100 08/17/13 1217 08/19/13 1214 08/20/13 0317  NA 138 141 142 143  K 3.3* 3.2* 4.1 4.2  CL 100 100 105 106  CO2 26  --  25 24  BUN 27* 27* 27* 22  CREATININE 0.82 1.00 1.09 0.76  GLUCOSE 82 87 108* 183*   Electrolytes  Recent Labs Lab 08/17/13 1100 08/19/13 1214 08/20/13 0317  CALCIUM 8.7 8.3* 7.6*   Sepsis Markers  Recent Labs Lab 08/19/13 1246  LATICACIDVEN 1.11   ABG  Recent Labs Lab 08/19/13 1546  PHART 7.343*  PCO2ART 41.8  PO2ART 60.0*   Liver Enzymes  Recent Labs Lab 08/17/13 1100 08/19/13 1214 08/20/13 0317  AST 24 31 24   ALT 24 31 28   ALKPHOS 64 57 53  BILITOT 0.3 0.4 0.4  ALBUMIN 3.1* 3.0* 2.7*   Cardiac Enzymes  Recent Labs Lab 08/19/13 1238  TROPONINI <0.30   Glucose  Recent Labs Lab 08/14/13 0747 08/19/13 2128  GLUCAP 86 194*    Imaging Dg Chest Port 1  View  (if Code Sepsis Called)  08/19/2013   CLINICAL DATA:  Fever, shortness of breath, weakness, confusion, history COPD, smoking  EXAM: PORTABLE CHEST - 1 VIEW  COMPARISON:  Portable exam 1229 hr compared to 08/12/2013  FINDINGS: Left subclavian dual-lumen central venous catheter unchanged distal tip projecting over cavoatrial junction.  Upper normal heart size.  Calcified tortuous aorta.  Mediastinal contours and pulmonary vascularity normal.  Bronchitic changes and right basilar atelectasis.  No gross infiltrate, pleural effusion or pneumothorax.  Osseous demineralization.  IMPRESSION: Mild bronchitic changes with right basilar atelectasis.   Electronically Signed   By: Lavonia Dana M.D.   On: 08/19/2013 12:52   Dg Foot Complete Right  08/19/2013   CLINICAL DATA:  Foot pain, swelling, bruise   EXAM: RIGHT FOOT COMPLETE - 3+ VIEW  COMPARISON:  None.  FINDINGS: No acute fracture or malalignment. Normal bony mineralization. Soft tissue swelling over the dorsal aspect of the foot. No significant degenerative change. No ankle joint effusion.  IMPRESSION: No acute osseous abnormality. Soft tissue swelling over the dorsal forefoot.   Electronically Signed   By: Jacqulynn Cadet M.D.   On: 08/19/2013 22:32    ASSESSMENT / PLAN:   PULMONARY A: COPD without acute exacerbation - per patient, no worsening cough/mucous production     ? HCAP - though improved CXR on 04/21 would favor 7 day course for HCAP P:   - continue bronchodilators prn - continue vanc/zosyn   CARDIOVASCULAR A:  Mild hypotension (~80/60), resolved; baseline 95-110 per pt     ?HF - dyspnea, trace lower extremity edema, elevated BNP on 04/13 P:  - monitor BP - can consider 2D ECHO  RENAL A:  No acute abnormality. P:    GASTROINTESTINAL A:  No acute abnormality. P:   - diet resumed  HEMATOLOGIC A:  Hx of TTP - followed by Dr. Beryle Beams; currently being treated w/ plasma exchange (daily), Imuran, Rituxan and steroids (past 6 weeks) for fourth episode of TTP; plt currently stable 149K.     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  Baseline ~ 12? P:  - follow Hematology recommendations, Dr. Beryle Beams following  - no plasma exchange today - continue Prednisone 40mg  daily on days she does NOT get plasma exhange - daily CBC - VTE ppx - Lovenox  INFECTIOUS A:  SIRS w/o source - initial concern for L subclavian catheter infx; however, no leukocytosis or current fever, patient with significant improvement in short period of time       Oral candida in the setting of chronic steroid use - on Diflucan as an outpatient P:   - blood cultures pending - appreciate heme recommendations - plan to leave catheter in place pending culture results - continue vanc/zosyn - restart Diflucan 100mg   daily  ENDOCRINE A:  Chronic steroid use (x 6weeks)       CBGs elevated (steriod likely contributing) P:   - received Solu-Medrol on 04/20 - restart daily Prednisone (on days she does NOT get plasma exchange) - start SSI   NEUROLOGIC A:  ?Acute encephalopathy, resolved P:   - monitor for mental status change - continue home Prozac   Duwaine Maxin, DO IMTS, PGY1 08/20/2013, 8:08 AM   Attending:  I have seen and examined the patient with nurse practitioner/resident and agree with the note above.   Much improved  Not clear to me what caused the sepsis syndrome, agree watching for catheter related blood stream infection is important  No  longer needs ICU  Transfer back to IMTS  Roselie Awkward, MD [place] [facility] Pager: [phone] Cell: [phone] If no response, call [phone]

## 2013-08-20 NOTE — H&P (Signed)
INTERNAL MEDICINE TEACHING SERVICE Attending Admission Note  Date: 08/20/2013  Patient name: [name]  Medical record number: [id number]  Date of birth: 1940/10/01    I have seen and evaluated [name] and discussed their care with the Residency Team.  [name] was seen in the emergency department with team and Dr. Beryle Beams, her primary hematologist. She presented with malaise, fever, cough, SOB. She has a known history of COPD, tobacco abuse, TTP with ongoing treatment consisting of plasma exchange and rituxan, epidural abscess, endocarditis. She had undergone plasmapheresis since 4/3 until Thursday prior to admission. Her Plt count was noted to start stabilizing. She has also undergone tx with steroids. In the ED, she was noted to have MAPs as low as the mid 40 range, with concern for sepsis. She was started on Vancomycin/Zosyn with BC drawn prior. She received a dose of solumedrol for wheezing, but also to give her a stress dose given recent steroid treatment. She also received 4 L NS total for hypotension with improvement in BP. Lactic acid was 1.1. CCM was called to evaluate her.  VS: T 101 F, HR 102, BP 100/38, HR 102, O2 sat of 100% on 4 L [place], RR 20 upon initial exam.  GEN: Somnolent, but arousable. HEENT: evidence of thrush, EOMI, PERRLA. CV: Tachy, S1S2, no m/r/g PULM: bibasilar crackles, scattered rhonchi. ABD/GI: Soft, slightly distended, +tympanic, nontender, +BS. LE: 1-2+ pitting edema. R foot with purpura (improved per husband, recent trauma). NEURO: grossly intact.  -Severe sepsis vs septic shock: Concern for subclavian line as source. Agree with PCCM consult. Continue broad spectrum (Vanc/Zosyn). Further discussion with Dr. Beryle Beams regarding best course of action regarding line removal vs watching given need for plasmapheresis. -TTP: Being followed. Given stress dose steroids. Fairly stable Plt count. -R foot contusion: No fracture on xray. Continue  to monitor. -Overnight moved to ICU due to worsening AMS and hypoxia. She is improved this morning. -Currently under the care of PCCM.  Dominic Pea, DO, [facility] 08/20/2013, 11:39 AM

## 2013-08-21 DIAGNOSIS — D599 Acquired hemolytic anemia, unspecified: Secondary | ICD-10-CM

## 2013-08-21 LAB — POCT I-STAT, CHEM 8
BUN: 16 mg/dL (ref 6–23)
CREATININE: 0.9 mg/dL (ref 0.50–1.10)
Calcium, Ion: 1.09 mmol/L — ABNORMAL LOW (ref 1.13–1.30)
Chloride: 106 mEq/L (ref 96–112)
GLUCOSE: 116 mg/dL — AB (ref 70–99)
HCT: 25 % — ABNORMAL LOW (ref 36.0–46.0)
HEMOGLOBIN: 8.5 g/dL — AB (ref 12.0–15.0)
POTASSIUM: 3.6 meq/L — AB (ref 3.7–5.3)
SODIUM: 143 meq/L (ref 137–147)
TCO2: 24 mmol/L (ref 0–100)

## 2013-08-21 LAB — CBC
HCT: 22.4 % — ABNORMAL LOW (ref 36.0–46.0)
HEMOGLOBIN: 7.2 g/dL — AB (ref 12.0–15.0)
MCH: 30.8 pg (ref 26.0–34.0)
MCHC: 32.1 g/dL (ref 30.0–36.0)
MCV: 95.7 fL (ref 78.0–100.0)
PLATELETS: 149 10*3/uL — AB (ref 150–400)
RBC: 2.34 MIL/uL — ABNORMAL LOW (ref 3.87–5.11)
RDW: 15.2 % (ref 11.5–15.5)
WBC: 9.4 10*3/uL (ref 4.0–10.5)

## 2013-08-21 LAB — DIRECT ANTIGLOBULIN TEST (NOT AT ARMC)
DAT, IgG: NEGATIVE
DAT, complement: NEGATIVE

## 2013-08-21 LAB — BASIC METABOLIC PANEL
BUN: 20 mg/dL (ref 6–23)
CO2: 24 mEq/L (ref 19–32)
Calcium: 8 mg/dL — ABNORMAL LOW (ref 8.4–10.5)
Chloride: 106 mEq/L (ref 96–112)
Creatinine, Ser: 0.85 mg/dL (ref 0.50–1.10)
GFR calc Af Amer: 78 mL/min — ABNORMAL LOW (ref >90)
GFR, EST NON AFRICAN AMERICAN: 67 mL/min — AB (ref >90)
Glucose, Bld: 121 mg/dL — ABNORMAL HIGH (ref 70–99)
POTASSIUM: 3.7 meq/L (ref 3.7–5.3)
SODIUM: 143 meq/L (ref 137–147)

## 2013-08-21 LAB — LACTATE DEHYDROGENASE: LDH: 388 U/L — AB (ref 94–250)

## 2013-08-21 LAB — HEPATIC FUNCTION PANEL
ALT: 28 U/L (ref 0–35)
AST: 25 U/L (ref 0–37)
Albumin: 2.9 g/dL — ABNORMAL LOW (ref 3.5–5.2)
Alkaline Phosphatase: 49 U/L (ref 39–117)
Bilirubin, Direct: <0.2 mg/dL (ref 0.0–0.3)
Total Bilirubin: 0.5 mg/dL (ref 0.3–1.2)
Total Protein: 6.2 g/dL (ref 6.0–8.3)

## 2013-08-21 LAB — SAVE SMEAR

## 2013-08-21 LAB — HAPTOGLOBIN: HAPTOGLOBIN: 280 mg/dL — AB (ref 45–215)

## 2013-08-21 LAB — GLUCOSE, CAPILLARY
GLUCOSE-CAPILLARY: 153 mg/dL — AB (ref 70–99)
GLUCOSE-CAPILLARY: 140 mg/dL — AB (ref 70–99)
Glucose-Capillary: 89 mg/dL (ref 70–99)
Glucose-Capillary: 129 mg/dL — ABNORMAL HIGH (ref 70–99)

## 2013-08-21 LAB — RETICULOCYTES
RBC.: 2.34 MIL/uL — ABNORMAL LOW (ref 3.87–5.11)
Retic Count, Absolute: 103 10*3/uL (ref 19.0–186.0)
Retic Ct Pct: 4.4 % — ABNORMAL HIGH (ref 0.4–3.1)

## 2013-08-21 LAB — PREPARE RBC (CROSSMATCH)

## 2013-08-21 LAB — VANCOMYCIN, TROUGH: Vancomycin Tr: 11.5 ug/mL (ref 10.0–20.0)

## 2013-08-21 MED ORDER — ACETAMINOPHEN 325 MG PO TABS
ORAL_TABLET | ORAL | Status: AC
  Administered 2013-08-21: 650 mg via ORAL
  Filled 2013-08-21: qty 2

## 2013-08-21 MED ORDER — DIPHENHYDRAMINE HCL 50 MG PO CAPS
50.0000 mg | ORAL_CAPSULE | Freq: Once | ORAL | Status: AC
  Administered 2013-08-21: 50 mg via ORAL
  Filled 2013-08-21: qty 1
  Filled 2013-08-21: qty 2

## 2013-08-21 MED ORDER — ONE-DAILY MULTI VITAMINS PO TABS: 1.0000 | ORAL_TABLET | Freq: Two times a day (BID) | ORAL | Status: DC

## 2013-08-21 MED ORDER — ACETAMINOPHEN 325 MG PO TABS
650.0000 mg | ORAL_TABLET | ORAL | Status: DC | PRN
  Administered 2013-08-21: 650 mg via ORAL

## 2013-08-21 MED ORDER — CALCIUM CARBONATE ANTACID 500 MG PO CHEW
CHEWABLE_TABLET | ORAL | Status: AC
  Administered 2013-08-21: 400 mg via ORAL
  Filled 2013-08-21: qty 4

## 2013-08-21 MED ORDER — GABAPENTIN 400 MG PO CAPS
400.0000 mg | ORAL_CAPSULE | Freq: Three times a day (TID) | ORAL | Status: DC
  Administered 2013-08-21 – 2013-08-26 (×14): 400 mg via ORAL
  Filled 2013-08-21 (×17): qty 1

## 2013-08-21 MED ORDER — ANTICOAGULANT SODIUM CITRATE 4% (200MG/5ML) IV SOLN
5.0000 mL | Freq: Once | Status: AC
  Administered 2013-08-21: 5 mL
  Filled 2013-08-21 (×2): qty 250

## 2013-08-21 MED ORDER — ACD FORMULA A 0.73-2.45-2.2 GM/100ML VI SOLN
Status: AC
  Administered 2013-08-21: 52 mL
  Filled 2013-08-21: qty 500

## 2013-08-21 MED ORDER — ACETAMINOPHEN 325 MG PO TABS
650.0000 mg | ORAL_TABLET | Freq: Once | ORAL | Status: AC
  Administered 2013-08-21: 650 mg via ORAL
  Filled 2013-08-21: qty 2

## 2013-08-21 MED ORDER — METHYLPREDNISOLONE SODIUM SUCC 40 MG IJ SOLR
40.0000 mg | Freq: Once | INTRAMUSCULAR | Status: AC
  Filled 2013-08-21: qty 1

## 2013-08-21 MED ORDER — ACD FORMULA A 0.73-2.45-2.2 GM/100ML VI SOLN
500.0000 mL | Status: DC
  Administered 2013-08-23: 500 mL via INTRAVENOUS
  Filled 2013-08-21 (×2): qty 500

## 2013-08-21 MED ORDER — VANCOMYCIN HCL IN DEXTROSE 1-5 GM/200ML-% IV SOLN
1000.0000 mg | Freq: Two times a day (BID) | INTRAVENOUS | Status: DC
  Administered 2013-08-21 – 2013-08-26 (×9): 1000 mg via INTRAVENOUS
  Filled 2013-08-21 (×11): qty 200

## 2013-08-21 MED ORDER — ADULT MULTIVITAMIN W/MINERALS CH
1.0000 | ORAL_TABLET | Freq: Two times a day (BID) | ORAL | Status: DC
  Administered 2013-08-21 – 2013-08-26 (×10): 1 via ORAL
  Filled 2013-08-21 (×12): qty 1

## 2013-08-21 MED ORDER — QUETIAPINE FUMARATE 25 MG PO TABS
25.0000 mg | ORAL_TABLET | Freq: Every day | ORAL | Status: DC
  Administered 2013-08-21 – 2013-08-25 (×5): 25 mg via ORAL
  Filled 2013-08-21 (×6): qty 1

## 2013-08-21 MED ORDER — CALCIUM CARBONATE ANTACID 500 MG PO CHEW
2.0000 | CHEWABLE_TABLET | ORAL | Status: AC
  Administered 2013-08-21 (×2): 400 mg via ORAL
  Filled 2013-08-21 (×2): qty 2

## 2013-08-21 MED ORDER — ONDANSETRON 8 MG/NS 50 ML IVPB
8.0000 mg | Freq: Once | INTRAVENOUS | Status: DC
  Filled 2013-08-21: qty 8

## 2013-08-21 MED ORDER — ONDANSETRON HCL 4 MG/2ML IJ SOLN
INTRAMUSCULAR | Status: AC
  Administered 2013-08-21: 8 mg
  Filled 2013-08-21: qty 4

## 2013-08-21 MED ORDER — SODIUM CHLORIDE 0.9 % IV SOLN
4.0000 g | Freq: Once | INTRAVENOUS | Status: AC
  Administered 2013-08-21: 4 g via INTRAVENOUS
  Filled 2013-08-21 (×2): qty 40

## 2013-08-21 MED ORDER — DIPHENHYDRAMINE HCL 25 MG PO CAPS
ORAL_CAPSULE | ORAL | Status: AC
  Administered 2013-08-21: 25 mg via ORAL
  Filled 2013-08-21: qty 1

## 2013-08-21 MED ORDER — ONDANSETRON HCL 4 MG/5ML PO SOLN
8.0000 mg | Freq: Once | ORAL | Status: AC
  Administered 2013-08-21: 8 mg via ORAL

## 2013-08-21 MED ORDER — METHYLPREDNISOLONE SODIUM SUCC 125 MG IJ SOLR
INTRAMUSCULAR | Status: AC
  Administered 2013-08-21: 40 mg
  Filled 2013-08-21: qty 2

## 2013-08-21 MED ORDER — DIPHENHYDRAMINE HCL 25 MG PO CAPS
25.0000 mg | ORAL_CAPSULE | Freq: Four times a day (QID) | ORAL | Status: DC | PRN
  Administered 2013-08-21 – 2013-08-23 (×2): 25 mg via ORAL

## 2013-08-21 NOTE — Progress Notes (Signed)
TPE- complete. Pt tolerated fair. Had 1 episode nausea, Dr. Beryle Beams called, order to give 8mg  zofran iv. Pt stated relief after 10 minutes. Temp trending up towards end of treatment, max 99.9. Given Tylenol prior to last bag of FFP. Pt has no complaints, just states she feels "worn out now". Report called to RN on 3W, pt transported back in stable condition.

## 2013-08-21 NOTE — Progress Notes (Signed)
  Date: 08/21/2013  Patient name: [name]  Medical record number: [id number]  Date of birth: [birth year]   This patient has been seen and the plan of care was discussed with the house staff. Please see their note for complete details. I concur with their findings with the following additions/corrections: She looks well from a clinical standpoint. Afebrile. No current evidence of infection, but she did improve with broad spectrum IV abx. Plans to continue IV abx for at last one week. There is concern for ongoing hemolysis. Transfusion of 1 U PRBC today. Plasma exchange to be resumed. Appreciate Dr. Azucena Freed assistance.  Dominic Pea, DO, [facility] 08/21/2013, 2:27 PM

## 2013-08-21 NOTE — Progress Notes (Signed)
Pt has a hx of previous blood reaction. Tylenol and Benadryl have been given.

## 2013-08-21 NOTE — Progress Notes (Signed)
Patient ID: [name], female   DOB: 06/03/1940, 73 y.o.   MRN: [id number] She continues to improve clinically. Transferred out of intensive care back to medical ward. Day  3 Zosyn plus vancomycin. Maximum temperature 99.7 Blood pressure has stabilized back to baseline. No respiratory distress. Oxygen saturations greater than or equal to 96%  exam: Oropharynx no exudate. Vascular catheter site left subclavian position nontender Lung exam: Lungs now clear to auscultation Cardiac exam: Regular rhythm no tachycardia Abdomen: Soft nontender Extremities: Trace-1+ edema unchanged Neurologic: Grossly normal Pertinent lab: Hemoglobin continues to fall current value 7.2 g despite stabilization of platelet count currently 149,000. LDH is rising: 388 Reticulocyte count 4.4% Bilirubin yesterday 0.4 and transaminases normal. Blood cultures negative to date. Impression: #1. Intermittent fever unclear source She did appear to be septic on admission with development of hypotension. However initial lactic acid normal. Cultures remain negative. Condition did stabilize with broad-spectrum antibiotics. I wonder whether some component of her fever relates to ongoing hemolysis? Recommendation: I would continue broad-spectrum antibiotics to complete 7 days of IV unless a specific organism is cultured. Although she is not neutropenic, she is severely immunocompromised from her underlying blood disorder, chronic steroid use, and recent use of immunosuppressive drugs (Imuran) and Rituxan.  #2. Multiply relapsed TTP She appears to be actively hemolyzing despite stabilization of her platelet count. I reviewed her peripheral blood film from this morning. There are 2+ spherocytes, and polychromasia, but no schistocytes. Granulocytes do not appear toxic. Platelets now easily visible. I'm not quite sure how to explain this discrepancy. The peripheral blood picture does not look microangiopathic. I'm going to  check a direct and indirect Coombs to make sure she didn't develop any additional autoantibodies. I am going to check an ADAM-TS enzyme level as some indication that she is responding to plasma exchange. I am going to leave the vascular catheter in place and resume plasma exchange on an every other day basis. I will transfuse one unit of packed cells today.  #3. Obstructive airway disease  Patient husband present. Status discussed. My impressions above discussed with resident physician Dr. Heber Ozark.

## 2013-08-21 NOTE — Progress Notes (Signed)
Subjective: Patient overall feels better, she does note that she feels a little cold and has many blankets on.  Otherwise she has no current complaints. Objective: Vital signs in last 24 hours: Filed Vitals:   08/20/13 2059 08/21/13 0513 08/21/13 1048 08/21/13 1115  BP: 131/69 144/76 159/74 142/69  Pulse: 108 99 100 90  Temp: 99.2 F (37.3 C) 99.7 F (37.6 C) 98.1 F (36.7 C) 98.7 F (37.1 C)  TempSrc: Oral Oral Oral Oral  Resp: '20 16 16 18  ' Height:      Weight:  174 lb 9.7 oz (79.2 kg)    SpO2: 96% 97% 94% 93%   Weight change: 10.6 oz (0.3 kg)  Intake/Output Summary (Last 24 hours) at 08/21/13 1153 Last data filed at 08/21/13 1115  Gross per 24 hour  Intake  362.5 ml  Output      0 ml  Net  362.5 ml   General: resting in bed HEENT: EOMI Cardiac: RRR, no murmur Pulm/Chest: clear to auscultation bilaterally, Cath site nontender Abd: soft, nontender, nondistended, BS present Ext: warm and well perfused, trace pedal edema Neuro: alert and oriented X3 Lab Results: Basic Metabolic Panel:  Recent Labs Lab 08/20/13 0317 08/21/13 0333  NA 143 143  K 4.2 3.7  CL 106 106  CO2 24 24  GLUCOSE 183* 121*  BUN 22 20  CREATININE 0.76 0.85  CALCIUM 7.6* 8.0*   Liver Function Tests:  Recent Labs Lab 08/20/13 0317 08/21/13 0950  AST 24 25  ALT 28 28  ALKPHOS 53 49  BILITOT 0.4 0.5  PROT 5.8* 6.2  ALBUMIN 2.7* 2.9*   No results found for this basename: LIPASE, AMYLASE,  in the last 168 hours No results found for this basename: AMMONIA,  in the last 168 hours CBC:  Recent Labs Lab 08/17/13 1100  08/19/13 1214 08/20/13 0317 08/21/13 0333  WBC 7.0  --  8.2 9.6 9.4  NEUTROABS 5.9  --  6.7  --   --   HGB 7.8*  < > 7.9* 7.7* 7.2*  HCT 24.8*  < > 25.1* 25.0* 22.4*  MCV 97.3  --  98.0 98.0 95.7  PLT 131*  --  146* 149* 149*  < > = values in this interval not displayed. Cardiac Enzymes:  Recent Labs Lab 08/19/13 1238  TROPONINI <0.30   BNP: No results  found for this basename: PROBNP,  in the last 168 hours D-Dimer: No results found for this basename: DDIMER,  in the last 168 hours CBG:  Recent Labs Lab 08/19/13 2128 08/20/13 1656 08/20/13 2204 08/21/13 0746  GLUCAP 194* 192* 249* 89   Hemoglobin A1C:  Recent Labs Lab 08/20/13 1335  HGBA1C 5.8*   Fasting Lipid Panel: No results found for this basename: CHOL, HDL, LDLCALC, TRIG, CHOLHDL, LDLDIRECT,  in the last 168 hours Thyroid Function Tests: No results found for this basename: TSH, T4TOTAL, FREET4, T3FREE, THYROIDAB,  in the last 168 hours Coagulation: No results found for this basename: LABPROT, INR,  in the last 168 hours Anemia Panel:  Recent Labs Lab 08/21/13 0333  RETICCTPCT 4.4*   Urine Drug Screen: Drugs of Abuse  No results found for this basename: labopia, cocainscrnur, labbenz, amphetmu, thcu, labbarb    Alcohol Level: No results found for this basename: ETH,  in the last 168 hours Urinalysis:  Recent Labs Lab 08/19/13 1225  COLORURINE YELLOW  LABSPEC 1.018  PHURINE 7.0  [place]  PROTEINUR NEGATIVE  UROBILINOGEN 1.0  NITRITE NEGATIVE  LEUKOCYTESUR NEGATIVE   Micro Results: Recent Results (from the past 240 hour(s))  CULTURE, BLOOD (SINGLE)     Status: None   Collection Time    08/12/13 11:40 AM      Result Value Ref Range Status   Organism ID, Bacteria NO GROWTH 5 DAYS   Final  CULTURE, BLOOD (SINGLE)     Status: None   Collection Time    08/12/13 11:50 AM      Result Value Ref Range Status   Organism ID, Bacteria NO GROWTH 5 DAYS   Final  RESPIRATORY VIRUS PANEL     Status: None   Collection Time    08/12/13  8:58 PM      Result Value Ref Range Status   Source - RVPAN NASAL SWAB   Corrected   Comment: CORRECTED ON 04/15 AT 2053: PREVIOUSLY REPORTED AS NASAL SWAB   Respiratory Syncytial Virus A NOT DETECTED   Final   Respiratory Syncytial Virus B NOT DETECTED    Final   Influenza A NOT DETECTED   Final   Influenza B NOT DETECTED   Final   Parainfluenza 1 NOT DETECTED   Final   Parainfluenza 2 NOT DETECTED   Final   Parainfluenza 3 NOT DETECTED   Final   Metapneumovirus NOT DETECTED   Final   Rhinovirus NOT DETECTED   Final   Adenovirus NOT DETECTED   Final   Influenza A H1 NOT DETECTED   Final   Influenza A H3 NOT DETECTED   Final   Comment: (NOTE)           Normal Reference Range for each Analyte: NOT DETECTED     Testing performed using the Luminex xTAG Respiratory Viral Panel test     kit.     This test was developed and its performance characteristics determined     by [facility]. It has not been cleared or approved by the [place]     Food and Drug Administration. This test is used for clinical purposes.     It should not be regarded as investigational or for research. This     laboratory is certified under the [place] (CLIA) as qualified to perform high complexity     clinical laboratory testing.     Performed at [facility]     Status: None   Collection Time    08/19/13 12:25 PM      Result Value Ref Range Status   Specimen Description URINE, CLEAN CATCH   Final   Special Requests NONE   Final   Culture  Setup Time     Final   Value: 08/19/2013 17:18     Performed at [facility]     Final   Value: >=100,000 COLONIES/ML     Performed at [facility]   Culture     Final   Value: Multiple bacterial morphotypes present, none predominant. Suggest appropriate recollection if clinically indicated.     Performed at [facility]   Report Status 08/20/2013 FINAL   Final  CULTURE, BLOOD (ROUTINE X 2)     Status: None   Collection Time    08/19/13 12:30 PM      Result Value Ref Range Status   Specimen Description BLOOD WRIST LEFT   Final   Special Requests BOTTLES DRAWN  AEROBIC AND ANAEROBIC 10CC   Final   Culture  Setup  Time     Final   Value: 08/19/2013 17:32     Performed at [facility]   Culture     Final   Value:        BLOOD CULTURE RECEIVED NO GROWTH TO DATE CULTURE WILL BE HELD FOR 5 DAYS BEFORE ISSUING A FINAL NEGATIVE REPORT     Performed at [facility]   Report Status PENDING   Incomplete  CULTURE, BLOOD (ROUTINE X 2)     Status: None   Collection Time    08/19/13  1:42 PM      Result Value Ref Range Status   Specimen Description BLOOD ARM LEFT   Final   Special Requests BOTTLES DRAWN AEROBIC AND ANAEROBIC 10CC   Final   Culture  Setup Time     Final   Value: 08/19/2013 18:56     Performed at [facility]   Culture     Final   Value:        BLOOD CULTURE RECEIVED NO GROWTH TO DATE CULTURE WILL BE HELD FOR 5 DAYS BEFORE ISSUING A FINAL NEGATIVE REPORT     Performed at [facility]   Report Status PENDING   Incomplete  MRSA PCR SCREENING     Status: None   Collection Time    08/19/13  6:06 PM      Result Value Ref Range Status   MRSA by PCR NEGATIVE  NEGATIVE Final   Comment:            The GeneXpert MRSA Assay (FDA     approved for NASAL specimens     only), is one component of a     comprehensive MRSA colonization     surveillance program. It is not     intended to diagnose MRSA     infection nor to guide or     monitor treatment for     MRSA infections.   Studies/Results: Dg Chest Port 1 View  08/20/2013   CLINICAL DATA:  Cough, congestion  EXAM: PORTABLE CHEST - 1 VIEW  COMPARISON:  DG CHEST 1V PORT dated 08/19/2013  FINDINGS: There is a dual-lumen left-sided central venous catheter in satisfactory position. There is bilateral mild interstitial thickening. No pleural effusion or pneumothorax. The heart and mediastinal contours are unremarkable.  The osseous structures are unremarkable.  IMPRESSION: Bilateral mild interstitial thickening which may reflect mild interstitial edema versus mild bronchitis or atypical infection.   Electronically Signed    By: Kathreen Devoid   On: 08/20/2013 09:49   Dg Chest Port 1 View  (if Code Sepsis Called)  08/19/2013   CLINICAL DATA:  Fever, shortness of breath, weakness, confusion, history COPD, smoking  EXAM: PORTABLE CHEST - 1 VIEW  COMPARISON:  Portable exam 1229 hr compared to 08/12/2013  FINDINGS: Left subclavian dual-lumen central venous catheter unchanged distal tip projecting over cavoatrial junction.  Upper normal heart size.  Calcified tortuous aorta.  Mediastinal contours and pulmonary vascularity normal.  Bronchitic changes and right basilar atelectasis.  No gross infiltrate, pleural effusion or pneumothorax.  Osseous demineralization.  IMPRESSION: Mild bronchitic changes with right basilar atelectasis.   Electronically Signed   By: Lavonia Dana M.D.   On: 08/19/2013 12:52   Dg Foot Complete Right  08/19/2013   CLINICAL DATA:  Foot pain, swelling, bruise  EXAM: RIGHT FOOT COMPLETE - 3+ VIEW  COMPARISON:  None.  FINDINGS: No acute fracture or malalignment. Normal bony mineralization. Soft tissue swelling over the dorsal aspect of the foot. No significant degenerative change. No ankle joint effusion.  IMPRESSION: No acute osseous abnormality. Soft tissue swelling over the dorsal forefoot.   Electronically Signed   By: Jacqulynn Cadet M.D.   On: 08/19/2013 22:32   Medications: I have reviewed the patient's current medications. Scheduled Meds: . enoxaparin (LOVENOX) injection  40 mg Subcutaneous Q24H  . fluconazole  100 mg Oral Daily  . FLUoxetine  20 mg Oral Daily  . insulin aspart  0-5 Units Subcutaneous QHS  . insulin aspart  0-9 Units Subcutaneous TID WC  . nicotine  14 mg Transdermal Daily  . piperacillin-tazobactam (ZOSYN)  IV  3.375 g Intravenous Q8H  . predniSONE  40 mg Oral Q breakfast  . sodium chloride  3 mL Intravenous Q12H  . vancomycin  750 mg Intravenous Q12H   Continuous Infusions:  PRN Meds:.ipratropium-albuterol Assessment/Plan: Sepsis-resolved - Patient presented with fever of  (101, rectal), mild tachycardia, mild tachypnea.  She was mildly hypotensive and was started on empiric IV vanc and zosyn.  She clinically improved as has maintained stable vital signs.  Her vascular cathter shows no signs of infection but this would be the likely source of a possible infective cause.  However given her difficulty of vascular access with her multiple relapses of TTP removal is difficult. - Will continue empiric antibiotics will awaiting test results/ cultures to determine if cath can be removed. - Continue IV Vanc and Zosyn for at least 1 week  COPD (chronic obstructive pulmonary disease)  - Bronchodilators pnr   TTP (thrombotic thrombocytopenic purpura)  - Dr. Beryle Beams ([facility]) following discussed peripheral blood smear and case with him. - Hold plasma exchange today, it platlet counts stable may be able to remove catheter.  - Prednisone 32m on non plasma exchange days  Hemolytic anemia Platelet count remains stable however evidence of hemolysis -Transfuse 1 nit PRBC - Check direct and indirect Coombs - Check ADAMTS for response to plasma exchange  Dispo: Disposition is deferred at this time, awaiting improvement of current medical problems.    The patient does have a current PCP (Kirk Ruths MD) and [facility].  The patient does not have transportation limitations that hinder transportation to clinic appointments.  .Services Needed at time of discharge: Y = Yes, Blank = No PT:   OT:   RN:   Equipment:   Other:     LOS: 2 days   EJoni Reining DO 08/21/2013, 11:53 AM

## 2013-08-21 NOTE — Progress Notes (Signed)
Pt transferred to unit from 30M. Pt A&O & VS stable. Pt oriented to unit & call bell within reach. Pt currently resting comfortably in bed. Will continue to monitor.

## 2013-08-21 NOTE — Progress Notes (Signed)
ANTIBIOTIC CONSULT NOTE - Follow-up  Pharmacy Consult:  Vancomycin / Zosyn Indication:    Allergies  Allergen Reactions  . Cefuroxime Axetil Other (See Comments)    "jittery"  . Adhesive [Tape] Other (See Comments)    Blisters, band-aide brand   . Other Other (See Comments)    Wool: Reaction is hives [employer] tape: Reaction is blistering   . Sulfa Antibiotics Hives  . Sulfa Drugs Cross Reactors Other (See Comments)    unknown    Patient Measurements: Height: 5' 4.17" (163 cm) Weight: 174 lb 9.7 oz (79.2 kg) IBW/kg (Calculated) : 55.1  Vital Signs: Temp: 99 F (37.2 C) (04/22 1559) Temp src: Oral (04/22 1559) BP: 138/58 mmHg (04/22 1559) Pulse Rate: 89 (04/22 1559) Intake/Output from this shift: Total I/O In: 287.5 [Blood:287.5] Out: -   Labs:  Recent Labs  08/19/13 1214 08/20/13 0317 08/21/13 0333  WBC 8.2 9.6 9.4  HGB 7.9* 7.7* 7.2*  PLT 146* 149* 149*  CREATININE 1.09 0.76 0.85   Estimated Creatinine Clearance: 62 ml/min (by C-G formula based on Cr of 0.85).  Recent Labs  08/21/13 1330  VANCOTROUGH 11.5     Microbiology: Recent Results (from the past 720 hour(s))  CULTURE, BLOOD (SINGLE)     Status: None   Collection Time    08/12/13 11:40 AM      Result Value Ref Range Status   Organism ID, Bacteria NO GROWTH 5 DAYS   Final  CULTURE, BLOOD (SINGLE)     Status: None   Collection Time    08/12/13 11:50 AM      Result Value Ref Range Status   Organism ID, Bacteria NO GROWTH 5 DAYS   Final  RESPIRATORY VIRUS PANEL     Status: None   Collection Time    08/12/13  8:58 PM      Result Value Ref Range Status   Source - RVPAN NASAL SWAB   Corrected   Comment: CORRECTED ON 04/15 AT 2053: PREVIOUSLY REPORTED AS NASAL SWAB   Respiratory Syncytial Virus A NOT DETECTED   Final   Respiratory Syncytial Virus B NOT DETECTED   Final   Influenza A NOT DETECTED   Final   Influenza B NOT DETECTED   Final   Parainfluenza 1 NOT DETECTED    Final   Parainfluenza 2 NOT DETECTED   Final   Parainfluenza 3 NOT DETECTED   Final   Metapneumovirus NOT DETECTED   Final   Rhinovirus NOT DETECTED   Final   Adenovirus NOT DETECTED   Final   Influenza A H1 NOT DETECTED   Final   Influenza A H3 NOT DETECTED   Final   Comment: (NOTE)           Normal Reference Range for each Analyte: NOT DETECTED     Testing performed using the Luminex xTAG Respiratory Viral Panel test     kit.     This test was developed and its performance characteristics determined     by [facility]. It has not been cleared or approved by the [place]     Food and Drug Administration. This test is used for clinical purposes.     It should not be regarded as investigational or for research. This     laboratory is certified under the [place] (CLIA) as qualified to perform high complexity     clinical laboratory testing.  Performed at [facility]     Status: None   Collection Time    08/19/13 12:25 PM      Result Value Ref Range Status   Specimen Description URINE, CLEAN CATCH   Final   Special Requests NONE   Final   Culture  Setup Time     Final   Value: 08/19/2013 17:18     Performed at [facility]     Final   Value: >=100,000 COLONIES/ML     Performed at [facility]   Culture     Final   Value: Multiple bacterial morphotypes present, none predominant. Suggest appropriate recollection if clinically indicated.     Performed at [facility]   Report Status 08/20/2013 FINAL   Final  CULTURE, BLOOD (ROUTINE X 2)     Status: None   Collection Time    08/19/13 12:30 PM      Result Value Ref Range Status   Specimen Description BLOOD WRIST LEFT   Final   Special Requests BOTTLES DRAWN AEROBIC AND ANAEROBIC 10CC   Final   Culture  Setup Time     Final   Value: 08/19/2013 17:32     Performed at [facility]   Culture     Final   Value:         BLOOD CULTURE RECEIVED NO GROWTH TO DATE CULTURE WILL BE HELD FOR 5 DAYS BEFORE ISSUING A FINAL NEGATIVE REPORT     Performed at [facility]   Report Status PENDING   Incomplete  CULTURE, BLOOD (ROUTINE X 2)     Status: None   Collection Time    08/19/13  1:42 PM      Result Value Ref Range Status   Specimen Description BLOOD ARM LEFT   Final   Special Requests BOTTLES DRAWN AEROBIC AND ANAEROBIC 10CC   Final   Culture  Setup Time     Final   Value: 08/19/2013 18:56     Performed at [facility]   Culture     Final   Value:        BLOOD CULTURE RECEIVED NO GROWTH TO DATE CULTURE WILL BE HELD FOR 5 DAYS BEFORE ISSUING A FINAL NEGATIVE REPORT     Performed at [facility]   Report Status PENDING   Incomplete  MRSA PCR SCREENING     Status: None   Collection Time    08/19/13  6:06 PM      Result Value Ref Range Status   MRSA by PCR NEGATIVE  NEGATIVE Final   Comment:            The GeneXpert MRSA Assay (FDA     approved for NASAL specimens     only), is one component of a     comprehensive MRSA colonization     surveillance program. It is not     intended to diagnose MRSA     infection nor to guide or     monitor treatment for     MRSA infections.   Assessment: 64 YOF with Vanc/Zosyn Day #3 (plan for total of 7 days) for HCAP. Tmax 99.8. WBC wnl. Vancomycin trough 11.5 mcg/ml (subtherapeutic) on 725m IV q12h. Renal function remains stable.  Vanc 4/20 >> Zosyn 4/20 >> PO Fluconazole>> f/u stop date--PTA for candidiasis per Dr. GBeryle Beams 4/20 bld cx x2 >>ngtd 4/20 urine cx >> 100kcol mult bact  Goal of Therapy:  Vancomycin trough level 15-20 mcg/ml  Plan:  - Increase Vancomycin to 1066m IV Q12H. - Zosyn 3.375gm IV Q8H, 4 hr infusion - Monitor renal fxn, clinical course, vanc trough at new Css if continues >7 days  CSherlon Handing PharmD, BCPS Clinical pharmacist, pager [phone]/22/2015, 4:08 PM

## 2013-08-22 LAB — TYPE AND SCREEN
ABO/RH(D): O POS
Antibody Screen: NEGATIVE
Unit division: 0

## 2013-08-22 LAB — THERAPEUTIC PLASMA EXCHANGE (BLOOD BANK)
Plasma volume needed: 2600
UNIT DIVISION: 0
UNIT DIVISION: 0
Unit division: 0
Unit division: 0
Unit division: 0
Unit division: 0
Unit division: 0
Unit division: 0
Unit division: 0

## 2013-08-22 LAB — BASIC METABOLIC PANEL
BUN: 27 mg/dL — ABNORMAL HIGH (ref 6–23)
CHLORIDE: 105 meq/L (ref 96–112)
CO2: 28 meq/L (ref 19–32)
Calcium: 8.8 mg/dL (ref 8.4–10.5)
Creatinine, Ser: 1.03 mg/dL (ref 0.50–1.10)
GFR calc Af Amer: 62 mL/min — ABNORMAL LOW (ref >90)
GFR calc non Af Amer: 53 mL/min — ABNORMAL LOW (ref >90)
Glucose, Bld: 114 mg/dL — ABNORMAL HIGH (ref 70–99)
Potassium: 4.4 mEq/L (ref 3.7–5.3)
SODIUM: 143 meq/L (ref 137–147)

## 2013-08-22 LAB — ADAMTS13 ACTIVITY: Adamts 13 Activity: <3 % Activity — ABNORMAL LOW (ref 68–163)

## 2013-08-22 LAB — RETICULOCYTES
RBC.: 2.85 MIL/uL — ABNORMAL LOW (ref 3.87–5.11)
Retic Count, Absolute: 111.2 10*3/uL (ref 19.0–186.0)
Retic Ct Pct: 3.9 % — ABNORMAL HIGH (ref 0.4–3.1)

## 2013-08-22 LAB — GLUCOSE, CAPILLARY
GLUCOSE-CAPILLARY: 139 mg/dL — AB (ref 70–99)
Glucose-Capillary: 82 mg/dL (ref 70–99)
Glucose-Capillary: 205 mg/dL — ABNORMAL HIGH (ref 70–99)
Glucose-Capillary: 135 mg/dL — ABNORMAL HIGH (ref 70–99)

## 2013-08-22 LAB — LACTATE DEHYDROGENASE: LDH: 441 U/L — ABNORMAL HIGH (ref 94–250)

## 2013-08-22 LAB — CBC
HCT: 25.7 % — ABNORMAL LOW (ref 36.0–46.0)
Hemoglobin: 8.2 g/dL — ABNORMAL LOW (ref 12.0–15.0)
MCH: 30.1 pg (ref 26.0–34.0)
MCHC: 31.9 g/dL (ref 30.0–36.0)
MCV: 94.5 fL (ref 78.0–100.0)
Platelets: 134 10*3/uL — ABNORMAL LOW (ref 150–400)
RBC: 2.72 MIL/uL — ABNORMAL LOW (ref 3.87–5.11)
RDW: 16.2 % — ABNORMAL HIGH (ref 11.5–15.5)
WBC: 7.5 10*3/uL (ref 4.0–10.5)

## 2013-08-22 MED ORDER — AZATHIOPRINE 50 MG PO TABS
100.0000 mg | ORAL_TABLET | Freq: Every day | ORAL | Status: DC
  Administered 2013-08-22 – 2013-08-26 (×5): 100 mg via ORAL
  Filled 2013-08-22 (×5): qty 2

## 2013-08-22 MED ORDER — NICOTINE 21 MG/24HR TD PT24
21.0000 mg | MEDICATED_PATCH | Freq: Every day | TRANSDERMAL | Status: DC
  Administered 2013-08-22 – 2013-08-26 (×5): 21 mg via TRANSDERMAL
  Filled 2013-08-22 (×5): qty 1

## 2013-08-22 NOTE — Progress Notes (Signed)
  Date: 08/22/2013  Patient name: [name]  Medical record number: [id number]  Date of birth: 23-Dec-1940   This patient has been seen and the plan of care was discussed with the house staff. Please see their note for complete details. I concur with their findings with the following additions/corrections: Appears clinically well. BC still negative to date. Difficult scenario due to relapse of TTP. Plans to resume imuran and continue plasma exchange. Hemodynamically stable. Appreciate Dr. Azucena Freed expertise in this case.  Dominic Pea, DO, [facility] 08/22/2013, 1:43 PM

## 2013-08-22 NOTE — Progress Notes (Signed)
Subjectively she feels much better Physical exam stable no new findings Blood cultures remain negative. Urine culture contaminated from admission. Day #4 Zosyn plus vancomycin Coombs direct and indirect test negative. Haptoglobin surprisingly elevated not decreased She went back on plasma exchange yesterday. She received one unit of packed red cells prior to the exchange for hemoglobin 7.2. Hemoglobin 8.2 this morning Platelet count lowered today down from 149,000 to 134,000. LDH pending.ADAM-TS enzyme analysis pending.  Impression: #1. Multiply relapsed TTP Process still appears to be active with fall in platelet count off plasma exchange for just 3 days although total platelet count is still in a safe range. I have resumed plasma exchange on an every other day basis however, if there is any further fall in her platelet count I will need to re- institute daily exchanges. Situation becoming increasingly difficult as time goes on and we are not seeing a complete response. The current episode started around March 6 and she has been on intermittent plasma exchange since March 13 with a failed attempt to stop plasma exchange which was resumed again on April 3. Now that major infection has been excluded, I am going to resume immunosuppressive therapy with Imuran 100 mg daily. Other than immunosuppressive drugs, only other alternative to achieve remission would be splenectomy which would carry significant morbidity in this elderly woman with obstructive airway disease.  #2. Culture negative fever Coincidental resolution with broad-spectrum antibiotics. Source remains unclear.  #3. Obstructive airway disease  Management discussed with medical resident.

## 2013-08-22 NOTE — Progress Notes (Signed)
CRITICAL VALUE ALERT  Critical value received:  [id number] less than 3  Date of notification:  08/22/13  Time of notification:  5:08 PM  Critical value read back:yes  Nurse who received alert:  Barrie Lyme   MD notified (1st page): Dr. Heber Monroe   Time of first page:  5:08 PM  MD notified (2nd page):  Time of second page:   Responding MD:  Dr. Heber [place]   Time MD responded:  5:09 PM

## 2013-08-22 NOTE — Progress Notes (Signed)
Subjective: Patient feeling much better, says she is back to her normal self.  Tmax 99.9 VSS overnight Objective: Vital signs in last 24 hours: Filed Vitals:   08/21/13 1631 08/22/13 0514 08/22/13 0800 08/22/13 1409  BP: 130/99 124/57 122/69 145/68  Pulse: 97 84 82 94  Temp: 99 F (37.2 C) 97.9 F (36.6 C) 98.3 F (36.8 C) 98.6 F (37 C)  TempSrc: Oral Oral Oral Oral  Resp: '19 18  20  ' Height:      Weight:  179 lb 10.8 oz (81.5 kg)    SpO2: 95% 96% 93% 94%   Weight change: 5 lb 1.1 oz (2.3 kg)  Intake/Output Summary (Last 24 hours) at 08/22/13 1713 Last data filed at 08/22/13 0525  Gross per 24 hour  Intake    490 ml  Output      0 ml  Net    490 ml   General: sitting up at edge of bed Cardiac: RRR, no murmur Pulm/Chest: CTA B/L, no tenderness of cath site Abd: soft, nontender, nondistended, BS present Ext: warm and well perfused, no pedal edema  Lab Results: Basic Metabolic Panel:  Recent Labs Lab 08/21/13 0333 08/21/13 1514 08/22/13 0640  NA 143 143 143  K 3.7 3.6* 4.4  CL 106 106 105  CO2 24  --  28  GLUCOSE 121* 116* 114*  BUN 20 16 27*  CREATININE 0.85 0.90 1.03  CALCIUM 8.0*  --  8.8   Liver Function Tests:  Recent Labs Lab 08/20/13 0317 08/21/13 0950  AST 24 25  ALT 28 28  ALKPHOS 53 49  BILITOT 0.4 0.5  PROT 5.8* 6.2  ALBUMIN 2.7* 2.9*   CBC:  Recent Labs Lab 08/17/13 1100  08/19/13 1214  08/21/13 0333 08/21/13 1514 08/22/13 0640  WBC 7.0  --  8.2  < > 9.4  --  7.5  NEUTROABS 5.9  --  6.7  --   --   --   --   HGB 7.8*  < > 7.9*  < > 7.2* 8.5* 8.2*  HCT 24.8*  < > 25.1*  < > 22.4* 25.0* 25.7*  MCV 97.3  --  98.0  < > 95.7  --  94.5  PLT 131*  --  146*  < > 149*  --  134*  < > = values in this interval not displayed. Cardiac Enzymes:  Recent Labs Lab 08/19/13 1238  TROPONINI <0.30   CBG:  Recent Labs Lab 08/21/13 1209 08/21/13 1718 08/21/13 2126 08/22/13 0803 08/22/13 1241 08/22/13 1651  GLUCAP 153* 129* 140* 82  139* 205*   Hemoglobin A1C:  Recent Labs Lab 08/20/13 1335  HGBA1C 5.8*   Anemia Panel:  Recent Labs Lab 08/22/13 0855  RETICCTPCT 3.9*   Urine Drug Screen: Drugs of Abuse  No results found for this basename: labopia,  cocainscrnur,  labbenz,  amphetmu,  thcu,  labbarb    Alcohol Level: No results found for this basename: ETH,  in the last 168 hours Urinalysis:  Recent Labs Lab 08/19/13 1225  COLORURINE YELLOW  LABSPEC 1.018  PHURINE 7.0  [place] 1.0  NITRITE NEGATIVE  LEUKOCYTESUR NEGATIVE   Micro Results: Recent Results (from the past 240 hour(s))  RESPIRATORY VIRUS PANEL     Status: None   Collection Time    08/12/13  8:58 PM      Result Value Ref Range Status  Source - RVPAN NASAL SWAB   Corrected   Comment: CORRECTED ON 04/15 AT 2053: PREVIOUSLY REPORTED AS NASAL SWAB   Respiratory Syncytial Virus A NOT DETECTED   Final   Respiratory Syncytial Virus B NOT DETECTED   Final   Influenza A NOT DETECTED   Final   Influenza B NOT DETECTED   Final   Parainfluenza 1 NOT DETECTED   Final   Parainfluenza 2 NOT DETECTED   Final   Parainfluenza 3 NOT DETECTED   Final   Metapneumovirus NOT DETECTED   Final   Rhinovirus NOT DETECTED   Final   Adenovirus NOT DETECTED   Final   Influenza A H1 NOT DETECTED   Final   Influenza A H3 NOT DETECTED   Final   Comment: (NOTE)           Normal Reference Range for each Analyte: NOT DETECTED     Testing performed using the Luminex xTAG Respiratory Viral Panel test     kit.     This test was developed and its performance characteristics determined     by [facility]. It has not been cleared or approved by the [place]     Food and Drug Administration. This test is used for clinical purposes.     It should not be regarded as investigational or for research. This     laboratory is certified under the [place] (CLIA) as qualified to perform high complexity     clinical laboratory testing.     Performed at [facility]     Status: None   Collection Time    08/19/13 12:25 PM      Result Value Ref Range Status   Specimen Description URINE, CLEAN CATCH   Final   Special Requests NONE   Final   Culture  Setup Time     Final   Value: 08/19/2013 17:18     Performed at [facility]     Final   Value: >=100,000 COLONIES/ML     Performed at [facility]   Culture     Final   Value: Multiple bacterial morphotypes present, none predominant. Suggest appropriate recollection if clinically indicated.     Performed at [facility]   Report Status 08/20/2013 FINAL   Final  CULTURE, BLOOD (ROUTINE X 2)     Status: None   Collection Time    08/19/13 12:30 PM      Result Value Ref Range Status   Specimen Description BLOOD WRIST LEFT   Final   Special Requests BOTTLES DRAWN AEROBIC AND ANAEROBIC 10CC   Final   Culture  Setup Time     Final   Value: 08/19/2013 17:32     Performed at [facility]   Culture     Final   Value:        BLOOD CULTURE RECEIVED NO GROWTH TO DATE CULTURE WILL BE HELD FOR 5 DAYS BEFORE ISSUING A FINAL NEGATIVE REPORT     Performed at [facility]   Report Status PENDING   Incomplete  CULTURE, BLOOD (ROUTINE X 2)     Status: None   Collection Time    08/19/13  1:42 PM      Result Value Ref Range Status   Specimen Description BLOOD ARM LEFT   Final   Special Requests BOTTLES DRAWN AEROBIC AND ANAEROBIC  10CC   Final   Culture  Setup Time     Final   Value: 08/19/2013 18:56     Performed at [facility]   Culture     Final   Value:        BLOOD CULTURE RECEIVED NO GROWTH TO DATE CULTURE WILL BE HELD FOR 5 DAYS BEFORE ISSUING A FINAL NEGATIVE REPORT     Performed at [facility]   Report Status PENDING   Incomplete  MRSA PCR SCREENING     Status: None     Collection Time    08/19/13  6:06 PM      Result Value Ref Range Status   MRSA by PCR NEGATIVE  NEGATIVE Final   Comment:            The GeneXpert MRSA Assay (FDA     approved for NASAL specimens     only), is one component of a     comprehensive MRSA colonization     surveillance program. It is not     intended to diagnose MRSA     infection nor to guide or     monitor treatment for     MRSA infections.   Studies/Results: No results found. Medications: I have reviewed the patient's current medications. Scheduled Meds: . azaTHIOprine  100 mg Oral Daily  . fluconazole  100 mg Oral Daily  . FLUoxetine  20 mg Oral Daily  . gabapentin  400 mg Oral TID  . insulin aspart  0-5 Units Subcutaneous QHS  . insulin aspart  0-9 Units Subcutaneous TID WC  . multivitamin with minerals  1 tablet Oral BID  . nicotine  21 mg Transdermal Daily  . piperacillin-tazobactam (ZOSYN)  IV  3.375 g Intravenous Q8H  . predniSONE  40 mg Oral Q breakfast  . QUEtiapine  25 mg Oral QHS  . sodium chloride  3 mL Intravenous Q12H  . vancomycin  1,000 mg Intravenous Q12H   Continuous Infusions: . citrate dextrose     PRN Meds:.acetaminophen, diphenhydrAMINE, ipratropium-albuterol Assessment/Plan: Suspected line infection - Day 4 Vanz/Zosyn - Suspect line infection as cause of SIRS but site appears clean without tenderness.  Vancomycin alone will likely provide coverage for line infection however hesitant to discontinue at this time given comorbidity of TTP possibly requiring daily plasma exchange. -Continue to follow blood cultures to try to narrow Abx.  COPD (chronic obstructive pulmonary disease)  - Bronchodilators pnr   TTP (thrombotic thrombocytopenic purpura)  - Platelets fell to 134, hgb 8.2 post exchange. Haptoglobin elevated, coombs test negative.  Patient's ADAM-TS enzyme returned this afternoon at <3.  Her platlet count >130k however she does not have remission of TTP.  - If Platelets lower  tomorrow will need to resume daily Plasma exchange - If platelet stable or higher can continue every other day plasma exchange. - Resume Imuran 133m daily - Prednisone 474mon non plasma exchange days - Will try to avoid splenectomy if possible  Diet: Regular DVT PPx: SCDs (given TTP) Dispo: Disposition is deferred at this time, awaiting improvement of current medical problems.    The patient does have a current PCP (MKirk RuthsMD) and [facility].  The patient does not have transportation limitations that hinder transportation to clinic appointments.  .Services Needed at time of discharge: Y = Yes, Blank = No PT:   OT:   RN:   Equipment:   Other:  LOS: 3 days   Joni Reining, DO 08/22/2013, 5:13 PM

## 2013-08-23 LAB — CBC
HEMATOCRIT: 27.6 % — AB (ref 36.0–46.0)
HEMOGLOBIN: 8.8 g/dL — AB (ref 12.0–15.0)
MCH: 30 pg (ref 26.0–34.0)
MCHC: 31.9 g/dL (ref 30.0–36.0)
MCV: 94.2 fL (ref 78.0–100.0)
Platelets: 154 10*3/uL (ref 150–400)
RBC: 2.93 MIL/uL — ABNORMAL LOW (ref 3.87–5.11)
RDW: 15.9 % — ABNORMAL HIGH (ref 11.5–15.5)
WBC: 8.2 10*3/uL (ref 4.0–10.5)

## 2013-08-23 LAB — BASIC METABOLIC PANEL
BUN: 26 mg/dL — AB (ref 6–23)
CHLORIDE: 103 meq/L (ref 96–112)
CO2: 29 mEq/L (ref 19–32)
Calcium: 8.6 mg/dL (ref 8.4–10.5)
Creatinine, Ser: 1.07 mg/dL (ref 0.50–1.10)
GFR calc Af Amer: 59 mL/min — ABNORMAL LOW (ref >90)
GFR calc non Af Amer: 51 mL/min — ABNORMAL LOW (ref >90)
GLUCOSE: 91 mg/dL (ref 70–99)
POTASSIUM: 3.8 meq/L (ref 3.7–5.3)
Sodium: 143 mEq/L (ref 137–147)

## 2013-08-23 LAB — RETICULOCYTES
RBC.: 2.91 MIL/uL — AB (ref 3.87–5.11)
Retic Count, Absolute: 101.9 10*3/uL (ref 19.0–186.0)
Retic Ct Pct: 3.5 % — ABNORMAL HIGH (ref 0.4–3.1)

## 2013-08-23 LAB — CBC WITH DIFFERENTIAL/PLATELET
BASOS PCT: 1 % (ref 0–1)
Basophils Absolute: 0.1 10*3/uL (ref 0.0–0.1)
Eosinophils Absolute: 0 10*3/uL (ref 0.0–0.7)
Eosinophils Relative: 0 % (ref 0–5)
HEMATOCRIT: 27.5 % — AB (ref 36.0–46.0)
Hemoglobin: 8.7 g/dL — ABNORMAL LOW (ref 12.0–15.0)
LYMPHS PCT: 6 % — AB (ref 12–46)
Lymphs Abs: 0.5 10*3/uL — ABNORMAL LOW (ref 0.7–4.0)
MCH: 29.9 pg (ref 26.0–34.0)
MCHC: 31.6 g/dL (ref 30.0–36.0)
MCV: 94.5 fL (ref 78.0–100.0)
Monocytes Absolute: 0.4 10*3/uL (ref 0.1–1.0)
Monocytes Relative: 5 % (ref 3–12)
Neutro Abs: 6.9 10*3/uL (ref 1.7–7.7)
Neutrophils Relative %: 88 % — ABNORMAL HIGH (ref 43–77)
Platelets: 155 10*3/uL (ref 150–400)
RBC: 2.91 MIL/uL — AB (ref 3.87–5.11)
RDW: 15.8 % — ABNORMAL HIGH (ref 11.5–15.5)
WBC: 7.9 10*3/uL (ref 4.0–10.5)

## 2013-08-23 LAB — COMPREHENSIVE METABOLIC PANEL
ALK PHOS: 57 U/L (ref 39–117)
ALT: 28 U/L (ref 0–35)
AST: 31 U/L (ref 0–37)
Albumin: 2.9 g/dL — ABNORMAL LOW (ref 3.5–5.2)
BUN: 26 mg/dL — ABNORMAL HIGH (ref 6–23)
CALCIUM: 8.3 mg/dL — AB (ref 8.4–10.5)
CO2: 29 meq/L (ref 19–32)
Chloride: 99 mEq/L (ref 96–112)
Creatinine, Ser: 1.14 mg/dL — ABNORMAL HIGH (ref 0.50–1.10)
GFR calc non Af Amer: 47 mL/min — ABNORMAL LOW (ref >90)
GFR, EST AFRICAN AMERICAN: 55 mL/min — AB (ref >90)
GLUCOSE: 141 mg/dL — AB (ref 70–99)
POTASSIUM: 3.4 meq/L — AB (ref 3.7–5.3)
SODIUM: 143 meq/L (ref 137–147)
Total Bilirubin: 0.4 mg/dL (ref 0.3–1.2)
Total Protein: 6 g/dL (ref 6.0–8.3)

## 2013-08-23 LAB — GLUCOSE, CAPILLARY
GLUCOSE-CAPILLARY: 152 mg/dL — AB (ref 70–99)
Glucose-Capillary: 79 mg/dL (ref 70–99)
Glucose-Capillary: 149 mg/dL — ABNORMAL HIGH (ref 70–99)
Glucose-Capillary: 250 mg/dL — ABNORMAL HIGH (ref 70–99)
Glucose-Capillary: 134 mg/dL — ABNORMAL HIGH (ref 70–99)

## 2013-08-23 LAB — POCT I-STAT, CHEM 8
BUN: 25 mg/dL — ABNORMAL HIGH (ref 6–23)
CHLORIDE: 97 meq/L (ref 96–112)
Calcium, Ion: 0.79 mmol/L — ABNORMAL LOW (ref 1.13–1.30)
Creatinine, Ser: 1.3 mg/dL — ABNORMAL HIGH (ref 0.50–1.10)
GLUCOSE: 139 mg/dL — AB (ref 70–99)
HEMATOCRIT: 30 % — AB (ref 36.0–46.0)
Hemoglobin: 10.2 g/dL — ABNORMAL LOW (ref 12.0–15.0)
POTASSIUM: 3.3 meq/L — AB (ref 3.7–5.3)
SODIUM: 143 meq/L (ref 137–147)
TCO2: 28 mmol/L (ref 0–100)

## 2013-08-23 LAB — LACTATE DEHYDROGENASE: LDH: 469 U/L — ABNORMAL HIGH (ref 94–250)

## 2013-08-23 MED ORDER — CALCIUM CARBONATE ANTACID 500 MG PO CHEW
2.0000 | CHEWABLE_TABLET | ORAL | Status: AC
  Filled 2013-08-23 (×2): qty 2

## 2013-08-23 MED ORDER — ANTICOAGULANT SODIUM CITRATE 4% (200MG/5ML) IV SOLN
5.0000 mL | Freq: Once | Status: AC
  Administered 2013-08-23: 5 mL
  Filled 2013-08-23: qty 250

## 2013-08-23 MED ORDER — CALCIUM CARBONATE ANTACID 500 MG PO CHEW
CHEWABLE_TABLET | ORAL | Status: AC
  Administered 2013-08-23: 200 mg
  Filled 2013-08-23: qty 1

## 2013-08-23 MED ORDER — ACETAMINOPHEN 325 MG PO TABS: 650.0000 mg | ORAL_TABLET | ORAL | Status: DC | PRN

## 2013-08-23 MED ORDER — METHYLPREDNISOLONE SODIUM SUCC 125 MG IJ SOLR
INTRAMUSCULAR | Status: AC
  Administered 2013-08-23: 40 mg
  Filled 2013-08-23: qty 2

## 2013-08-23 MED ORDER — ACD FORMULA A 0.73-2.45-2.2 GM/100ML VI SOLN
500.0000 mL | Status: DC
  Filled 2013-08-23: qty 500

## 2013-08-23 MED ORDER — ACD FORMULA A 0.73-2.45-2.2 GM/100ML VI SOLN
Status: AC
  Administered 2013-08-23: 500 mL via INTRAVENOUS
  Filled 2013-08-23: qty 500

## 2013-08-23 MED ORDER — DIPHENHYDRAMINE HCL 25 MG PO CAPS: 25.0000 mg | ORAL_CAPSULE | Freq: Four times a day (QID) | ORAL | Status: DC | PRN

## 2013-08-23 MED ORDER — SODIUM CHLORIDE 0.9 % IV SOLN
4.0000 g | Freq: Once | INTRAVENOUS | Status: AC
  Administered 2013-08-23: 4 g via INTRAVENOUS
  Filled 2013-08-23: qty 40

## 2013-08-23 MED ORDER — METHYLPREDNISOLONE SODIUM SUCC 40 MG IJ SOLR
40.0000 mg | Freq: Once | INTRAMUSCULAR | Status: AC
  Filled 2013-08-23: qty 1

## 2013-08-23 MED ORDER — DIPHENHYDRAMINE HCL 25 MG PO CAPS
ORAL_CAPSULE | ORAL | Status: AC
  Administered 2013-08-23: 25 mg via ORAL
  Filled 2013-08-23: qty 1

## 2013-08-23 NOTE — Progress Notes (Signed)
Subjective: Feels well, no complaints. Objective: Vital signs in last 24 hours: Filed Vitals:   08/22/13 0800 08/22/13 1409 08/22/13 2107 08/23/13 0511  BP: 122/69 145/68 145/74 166/72  Pulse: 82 94 90 100  Temp: 98.3 F (36.8 C) 98.6 F (37 C) 98.4 F (36.9 C) 98.9 F (37.2 C)  TempSrc: Oral Oral Oral Oral  Resp:  20 20 20   Height:      Weight:    178 lb 9.2 oz (81 kg)  SpO2: 93% 94% 95% 90%   Weight change: -1 lb 1.6 oz (-0.5 kg) No intake or output data in the 24 hours ending 08/23/13 0717 General: sitting up in chair Cardiac: RRR, no murmur Pulm/Chest: CTA B/L, no tenderness over cath site Abd: +BS soft NT/ND Ext: no pedal edema  Lab Results: Basic Metabolic Panel:  Recent Labs Lab 08/21/13 0333 08/21/13 1514 08/22/13 0640  NA 143 143 143  K 3.7 3.6* 4.4  CL 106 106 105  CO2 24  --  28  GLUCOSE 121* 116* 114*  BUN 20 16 27*  CREATININE 0.85 0.90 1.03  CALCIUM 8.0*  --  8.8   Liver Function Tests:  Recent Labs Lab 08/20/13 0317 08/21/13 0950  AST 24 25  ALT 28 28  ALKPHOS 53 49  BILITOT 0.4 0.5  PROT 5.8* 6.2  ALBUMIN 2.7* 2.9*   CBC:  Recent Labs Lab 08/17/13 1100  08/19/13 1214  08/21/13 0333 08/21/13 1514 08/22/13 0640  WBC 7.0  --  8.2  < > 9.4  --  7.5  NEUTROABS 5.9  --  6.7  --   --   --   --   HGB 7.8*  < > 7.9*  < > 7.2* 8.5* 8.2*  HCT 24.8*  < > 25.1*  < > 22.4* 25.0* 25.7*  MCV 97.3  --  98.0  < > 95.7  --  94.5  PLT 131*  --  146*  < > 149*  --  134*  < > = values in this interval not displayed. Cardiac Enzymes:  Recent Labs Lab 08/19/13 1238  TROPONINI <0.30   CBG:  Recent Labs Lab 08/21/13 1718 08/21/13 2126 08/22/13 0803 08/22/13 1241 08/22/13 1651 08/22/13 2146  GLUCAP 129* 140* 82 139* 205* 135*   Hemoglobin A1C:  Recent Labs Lab 08/20/13 1335  HGBA1C 5.8*   Anemia Panel:  Recent Labs Lab 08/22/13 0855  RETICCTPCT 3.9*   Urine Drug Screen: Drugs of Abuse  No results found for this  basename: labopia,  cocainscrnur,  labbenz,  amphetmu,  thcu,  labbarb    Alcohol Level: No results found for this basename: ETH,  in the last 168 hours Urinalysis:  Recent Labs Lab 08/19/13 1225  COLORURINE YELLOW  LABSPEC 1.018  PHURINE 7.0  [place] 1.0  NITRITE NEGATIVE  LEUKOCYTESUR NEGATIVE   Micro Results: Recent Results (from the past 240 hour(s))  URINE CULTURE     Status: None   Collection Time    08/19/13 12:25 PM      Result Value Ref Range Status   Specimen Description URINE, CLEAN CATCH   Final   Special Requests NONE   Final   Culture  Setup Time     Final   Value: 08/19/2013 17:18     Performed at [facility]     Final   Value: >=100,000 COLONIES/ML  Performed at [facility]     Final   Value: Multiple bacterial morphotypes present, none predominant. Suggest appropriate recollection if clinically indicated.     Performed at [facility]   Report Status 08/20/2013 FINAL   Final  CULTURE, BLOOD (ROUTINE X 2)     Status: None   Collection Time    08/19/13 12:30 PM      Result Value Ref Range Status   Specimen Description BLOOD WRIST LEFT   Final   Special Requests BOTTLES DRAWN AEROBIC AND ANAEROBIC 10CC   Final   Culture  Setup Time     Final   Value: 08/19/2013 17:32     Performed at [facility]   Culture     Final   Value:        BLOOD CULTURE RECEIVED NO GROWTH TO DATE CULTURE WILL BE HELD FOR 5 DAYS BEFORE ISSUING A FINAL NEGATIVE REPORT     Performed at [facility]   Report Status PENDING   Incomplete  CULTURE, BLOOD (ROUTINE X 2)     Status: None   Collection Time    08/19/13  1:42 PM      Result Value Ref Range Status   Specimen Description BLOOD ARM LEFT   Final   Special Requests BOTTLES DRAWN AEROBIC AND ANAEROBIC 10CC   Final   Culture  Setup Time     Final   Value:  08/19/2013 18:56     Performed at [facility]   Culture     Final   Value:        BLOOD CULTURE RECEIVED NO GROWTH TO DATE CULTURE WILL BE HELD FOR 5 DAYS BEFORE ISSUING A FINAL NEGATIVE REPORT     Performed at [facility]   Report Status PENDING   Incomplete  MRSA PCR SCREENING     Status: None   Collection Time    08/19/13  6:06 PM      Result Value Ref Range Status   MRSA by PCR NEGATIVE  NEGATIVE Final   Comment:            The GeneXpert MRSA Assay (FDA     approved for NASAL specimens     only), is one component of a     comprehensive MRSA colonization     surveillance program. It is not     intended to diagnose MRSA     infection nor to guide or     monitor treatment for     MRSA infections.   Studies/Results: No results found. Medications: I have reviewed the patient's current medications. Scheduled Meds: . azaTHIOprine  100 mg Oral Daily  . fluconazole  100 mg Oral Daily  . FLUoxetine  20 mg Oral Daily  . gabapentin  400 mg Oral TID  . insulin aspart  0-5 Units Subcutaneous QHS  . insulin aspart  0-9 Units Subcutaneous TID WC  . multivitamin with minerals  1 tablet Oral BID  . nicotine  21 mg Transdermal Daily  . piperacillin-tazobactam (ZOSYN)  IV  3.375 g Intravenous Q8H  . predniSONE  40 mg Oral Q breakfast  . QUEtiapine  25 mg Oral QHS  . sodium chloride  3 mL Intravenous Q12H  . vancomycin  1,000 mg Intravenous Q12H   Continuous Infusions: . citrate dextrose     PRN Meds:.acetaminophen, diphenhydrAMINE, ipratropium-albuterol Assessment/Plan: Suspected line infection - Day 5 Vanz/Zosyn  (Given hospitalization and frequent healthcare contact  need to cover for MRSA and pseudomonas) will need at least 7 days treatment. - Blood cultures 4/20 NGTD, urine cultures note revealing. - Cannot remove catheter yet given ongoing treatment for TTP relapse  COPD (chronic obstructive pulmonary disease)  - Bronchodilators pnr   TTP (thrombotic  thrombocytopenic purpura)  - Platelets 154 today.  Patient's ADAM-TS enzyme <3.   - Imuran 100mg  daily - Prednisone 40mg  on non plasma exchange days -Will undergo plasma exchange today and continue every other day plasma exchange for now.  Monitor platelet count and Hgb with daily CBC  Diet: Regular DVT PPx: SCDs (given TTP) Dispo: Disposition is deferred at this time, awaiting improvement of current medical problems.    The patient does have a current PCP Kirk Ruths, MD) and does not need an [facility] hospital follow-up appointment after discharge.  The patient does not have transportation limitations that hinder transportation to clinic appointments.  .Services Needed at time of discharge: Y = Yes, Blank = No PT:   OT:   RN:   Equipment:   Other:     LOS: 4 days   Joni Reining, DO 08/23/2013, 7:17 AM

## 2013-08-23 NOTE — Progress Notes (Signed)
Lab unable to draw ordered labs from patient due to patient being in hemodialysis. RN Cyril Mourning in dialysis contacted and stated that she drew these labs prior to patient going down to dialysis.

## 2013-08-23 NOTE — Progress Notes (Signed)
  Date: 08/23/2013  Patient name: [name]  Medical record number: [id number]  Date of birth: 03-Sep-1940   This patient has been seen and the plan of care was discussed with the house staff. Please see their note for complete details. I concur with their findings with the following additions/corrections: Feels well. No complaints. Catheter site is clear, dry, intact. No positive cultures to date. Plt is stable. Will continue tx with Vanc/Zosyn given high initial suspicion for now. Continue plasma exchange. Current tx with imuran and oral prednisone. Hopeful that she will be able to go home soon.  Dominic Pea, DO, [facility] 08/23/2013, 11:53 AM

## 2013-08-23 NOTE — Progress Notes (Signed)
  Echocardiogram  Patient requested to speak to her doctor before obtaining 2D echocardiogram. Please advise.   [place] 08/23/2013, 3:48 PM

## 2013-08-23 NOTE — Progress Notes (Signed)
TPE-Completed without issue. Pt is to have next treatment Sunday per Dr. Beryle Beams and continue on a OOD basis. Pt has no complaints.

## 2013-08-24 LAB — CBC
HCT: 25.5 % — ABNORMAL LOW (ref 36.0–46.0)
Hemoglobin: 8.1 g/dL — ABNORMAL LOW (ref 12.0–15.0)
MCH: 30 pg (ref 26.0–34.0)
MCHC: 31.8 g/dL (ref 30.0–36.0)
MCV: 94.4 fL (ref 78.0–100.0)
Platelets: 142 10*3/uL — ABNORMAL LOW (ref 150–400)
RBC: 2.7 MIL/uL — ABNORMAL LOW (ref 3.87–5.11)
RDW: 15.6 % — ABNORMAL HIGH (ref 11.5–15.5)
WBC: 7.3 10*3/uL (ref 4.0–10.5)

## 2013-08-24 LAB — THERAPEUTIC PLASMA EXCHANGE (BLOOD BANK)
Plasma Exchange: 2600
Plasma volume needed: 2600
Unit division: 0
Unit division: 0
Unit division: 0
Unit division: 0
Unit division: 0
Unit division: 0
Unit division: 0
Unit division: 0
Unit division: 0

## 2013-08-24 LAB — GLUCOSE, CAPILLARY
GLUCOSE-CAPILLARY: 94 mg/dL (ref 70–99)
GLUCOSE-CAPILLARY: 185 mg/dL — AB (ref 70–99)
GLUCOSE-CAPILLARY: 272 mg/dL — AB (ref 70–99)
Glucose-Capillary: 166 mg/dL — ABNORMAL HIGH (ref 70–99)

## 2013-08-24 MED ORDER — POLYETHYLENE GLYCOL 3350 17 G PO PACK
17.0000 g | PACK | Freq: Every day | ORAL | Status: DC | PRN
  Administered 2013-08-24: 17 g via ORAL
  Filled 2013-08-24: qty 1

## 2013-08-24 MED ORDER — POTASSIUM CHLORIDE CRYS ER 20 MEQ PO TBCR
20.0000 meq | EXTENDED_RELEASE_TABLET | Freq: Once | ORAL | Status: AC
  Administered 2013-08-24: 20 meq via ORAL
  Filled 2013-08-24: qty 1

## 2013-08-24 NOTE — Progress Notes (Signed)
  Echocardiogram 2D Echocardiogram has been performed.  Valinda Hoar 08/24/2013, 9:12 AM

## 2013-08-24 NOTE — Progress Notes (Signed)
ANTIBIOTIC CONSULT NOTE - FOLLOW UP  Pharmacy Consult for Vancomycin and Zosyn Indication: pneumonia and R/O line infection  Allergies  Allergen Reactions  . Cefuroxime Axetil Other (See Comments)    "jittery"  . Adhesive [Tape] Other (See Comments)    Blisters, band-aide brand   . Other Other (See Comments)    Wool: Reaction is hives [employer] adhesive tape: Reaction is blistering   . Sulfa Antibiotics Hives  . Sulfa Drugs Cross Reactors Other (See Comments)    unknown    Patient Measurements: Height: 5' 4.17" (163 cm) Weight: 185 lb 3 oz (84 kg) IBW/kg (Calculated) : 55.1 Adjusted Body Weight:   Vital Signs: Temp: 98.6 F (37 C) (04/25 0604) Temp src: Oral (04/25 0604) BP: 150/77 mmHg (04/25 0604) Pulse Rate: 97 (04/25 0604) Intake/Output from previous day: 04/24 0701 - 04/25 0700 In: 222 [P.O.:222] Out: -  Intake/Output from this shift:    Labs:  Recent Labs  08/23/13 0730 08/23/13 1229 08/23/13 1255 08/24/13 0645  WBC 8.2  --  7.9 7.3  HGB 8.8* 10.2* 8.7* 8.1*  PLT 154  --  155 142*  CREATININE 1.07 1.30* 1.14*  --    Estimated Creatinine Clearance: 47.7 ml/min (by C-G formula based on Cr of 1.14).  Recent Labs  08/21/13 1330  VANCOTROUGH 11.5     Microbiology: Recent Results (from the past 720 hour(s))  CULTURE, BLOOD (SINGLE)     Status: None   Collection Time    08/12/13 11:40 AM      Result Value Ref Range Status   Organism ID, Bacteria NO GROWTH 5 DAYS   Final  CULTURE, BLOOD (SINGLE)     Status: None   Collection Time    08/12/13 11:50 AM      Result Value Ref Range Status   Organism ID, Bacteria NO GROWTH 5 DAYS   Final  RESPIRATORY VIRUS PANEL     Status: None   Collection Time    08/12/13  8:58 PM      Result Value Ref Range Status   Source - RVPAN NASAL SWAB   Corrected   Comment: CORRECTED ON 04/15 AT 2053: PREVIOUSLY REPORTED AS NASAL SWAB   Respiratory Syncytial Virus A NOT DETECTED   Final   Respiratory  Syncytial Virus B NOT DETECTED   Final   Influenza A NOT DETECTED   Final   Influenza B NOT DETECTED   Final   Parainfluenza 1 NOT DETECTED   Final   Parainfluenza 2 NOT DETECTED   Final   Parainfluenza 3 NOT DETECTED   Final   Metapneumovirus NOT DETECTED   Final   Rhinovirus NOT DETECTED   Final   Adenovirus NOT DETECTED   Final   Influenza A H1 NOT DETECTED   Final   Influenza A H3 NOT DETECTED   Final   Comment: (NOTE)           Normal Reference Range for each Analyte: NOT DETECTED     Testing performed using the Luminex xTAG Respiratory Viral Panel test     kit.     This test was developed and its performance characteristics determined     by [facility]. It has not been cleared or approved by the [place]     Food and Drug Administration. This test is used for clinical purposes.     It should not be regarded as investigational or for research. This     laboratory is certified under   the Clinical Laboratory Improvement     Amendments of 1988 (CLIA) as qualified to perform high complexity     clinical laboratory testing.     Performed at [facility]  URINE CULTURE     Status: None   Collection Time    08/19/13 12:25 PM      Result Value Ref Range Status   Specimen Description URINE, CLEAN CATCH   Final   Special Requests NONE   Final   Culture  Setup Time     Final   Value: 08/19/2013 17:18     Performed at [facility]   Colony Count     Final   Value: >=100,000 COLONIES/ML     Performed at [facility]   Culture     Final   Value: Multiple bacterial morphotypes present, none predominant. Suggest appropriate recollection if clinically indicated.     Performed at [facility]   Report Status 08/20/2013 FINAL   Final  CULTURE, BLOOD (ROUTINE X 2)     Status: None   Collection Time    08/19/13 12:30 PM      Result Value Ref Range Status   Specimen Description BLOOD WRIST LEFT   Final   Special Requests BOTTLES DRAWN AEROBIC AND  ANAEROBIC 10CC   Final   Culture  Setup Time     Final   Value: 08/19/2013 17:32     Performed at [facility]   Culture     Final   Value:        BLOOD CULTURE RECEIVED NO GROWTH TO DATE CULTURE WILL BE HELD FOR 5 DAYS BEFORE ISSUING A FINAL NEGATIVE REPORT     Performed at [facility]   Report Status PENDING   Incomplete  CULTURE, BLOOD (ROUTINE X 2)     Status: None   Collection Time    08/19/13  1:42 PM      Result Value Ref Range Status   Specimen Description BLOOD ARM LEFT   Final   Special Requests BOTTLES DRAWN AEROBIC AND ANAEROBIC 10CC   Final   Culture  Setup Time     Final   Value: 08/19/2013 18:56     Performed at [facility]   Culture     Final   Value:        BLOOD CULTURE RECEIVED NO GROWTH TO DATE CULTURE WILL BE HELD FOR 5 DAYS BEFORE ISSUING A FINAL NEGATIVE REPORT     Performed at [facility]   Report Status PENDING   Incomplete  MRSA PCR SCREENING     Status: None   Collection Time    08/19/13  6:06 PM      Result Value Ref Range Status   MRSA by PCR NEGATIVE  NEGATIVE Final   Comment:            The GeneXpert MRSA Assay (FDA     approved for NASAL specimens     only), is one component of a     comprehensive MRSA colonization     surveillance program. It is not     intended to diagnose MRSA     infection nor to guide or     monitor treatment for     MRSA infections.    Anti-infectives   Start     Dose/Rate Route Frequency Ordered Stop   08/21/13 1700  vancomycin (VANCOCIN) IVPB 1000 mg/200 mL premix     1,000 mg 200   mL/hr over 60 Minutes Intravenous Every 12 hours 08/21/13 1612     08/20/13 1000  fluconazole (DIFLUCAN) tablet 100 mg     100 mg Oral Daily 08/20/13 0656     08/20/13 0200  vancomycin (VANCOCIN) IVPB 750 mg/150 ml premix  Status:  Discontinued     750 mg 150 mL/hr over 60 Minutes Intravenous Every 12 hours 08/19/13 1804 08/21/13 1612   08/19/13 2200  piperacillin-tazobactam (ZOSYN) IVPB 3.375 g      3.375 g 12.5 mL/hr over 240 Minutes Intravenous Every 8 hours 08/19/13 1804     08/19/13 1430  piperacillin-tazobactam (ZOSYN) IVPB 3.375 g     3.375 g 12.5 mL/hr over 240 Minutes Intravenous  Once 08/19/13 1422 08/19/13 1827   08/19/13 1430  vancomycin (VANCOCIN) IVPB 1000 mg/200 mL premix     1,000 mg 200 mL/hr over 60 Minutes Intravenous  Once 08/19/13 1422 08/19/13 1538      Assessment: 73yo female with HCAP and possible line infection, requiring multiple TPE for relapsing TTP.  She is currently on day#6/7 of antibiotics but noted MD may be hesitant to stop abx given need for continued TPE.  Vancomycin dose was adjusted on 4/22 and renal function has remained stable.  Blood cx remain NTD.  Goal of Therapy:  Vancomycin trough level 15-20 mcg/ml  Plan:  1-  Continue Vancomycin 1000mg IV q12 2-  Continue Zosyn 3.375gm IV q8, 4hr infusion 3-  Plan repeat VT if abx continue > 7 days 4-  Watch renal function and f/u blood cx   , PharmD Clinical Pharmacist [facility]    

## 2013-08-24 NOTE — Progress Notes (Signed)
Subjective: Feels well no complaints.  Afebrile overnight VSS. Objective: Vital signs in last 24 hours: Filed Vitals:   08/23/13 1451 08/23/13 1643 08/23/13 2205 08/24/13 0604  BP: 135/62  135/72 150/77  Pulse: 100  79 97  Temp: 98.6 F (37 C)  98.6 F (37 C) 98.6 F (37 C)  TempSrc: Oral  Oral Oral  Resp: 20  18 18   Height:      Weight:    185 lb 3 oz (84 kg)  SpO2: 89% 91% 95% 92%   Weight change: 6 lb 9.8 oz (3 kg)  Intake/Output Summary (Last 24 hours) at 08/24/13 0707 Last data filed at 08/23/13 1410  Gross per 24 hour  Intake    222 ml  Output      0 ml  Net    222 ml   General: laying in bed Cardiac: RRR, no murmur Pulm/Chest: CTA B/L, no tenderness over cath site Abd: +BS soft NT/ND Ext: no pedal edema  Lab Results: Basic Metabolic Panel:  Recent Labs Lab 08/23/13 0730 08/23/13 1229 08/23/13 1255  NA 143 143 143  K 3.8 3.3* 3.4*  CL 103 97 99  CO2 29  --  29  GLUCOSE 91 139* 141*  BUN 26* 25* 26*  CREATININE 1.07 1.30* 1.14*  CALCIUM 8.6  --  8.3*   Liver Function Tests:  Recent Labs Lab 08/21/13 0950 08/23/13 1255  AST 25 31  ALT 28 28  ALKPHOS 49 57  BILITOT 0.5 0.4  PROT 6.2 6.0  ALBUMIN 2.9* 2.9*   CBC:  Recent Labs Lab 08/19/13 1214  08/23/13 0730 08/23/13 1229 08/23/13 1255  WBC 8.2  < > 8.2  --  7.9  NEUTROABS 6.7  --   --   --  6.9  HGB 7.9*  < > 8.8* 10.2* 8.7*  HCT 25.1*  < > 27.6* 30.0* 27.5*  MCV 98.0  < > 94.2  --  94.5  PLT 146*  < > 154  --  155  < > = values in this interval not displayed. Cardiac Enzymes:  Recent Labs Lab 08/19/13 1238  TROPONINI <0.30   CBG:  Recent Labs Lab 08/22/13 2146 08/23/13 0807 08/23/13 1156 08/23/13 1420 08/23/13 1711 08/23/13 2208  GLUCAP 135* 79 152* 149* 250* 134*   Hemoglobin A1C:  Recent Labs Lab 08/20/13 1335  HGBA1C 5.8*   Anemia Panel:  Recent Labs Lab 08/23/13 1255  RETICCTPCT 3.5*   Urine Drug Screen: Drugs of Abuse  No results found for  this basename: labopia,  cocainscrnur,  labbenz,  amphetmu,  thcu,  labbarb    Alcohol Level: No results found for this basename: ETH,  in the last 168 hours Urinalysis:  Recent Labs Lab 08/19/13 1225  COLORURINE YELLOW  LABSPEC 1.018  PHURINE 7.0  [place] 1.0  NITRITE NEGATIVE  LEUKOCYTESUR NEGATIVE   Micro Results: Recent Results (from the past 240 hour(s))  URINE CULTURE     Status: None   Collection Time    08/19/13 12:25 PM      Result Value Ref Range Status   Specimen Description URINE, CLEAN CATCH   Final   Special Requests NONE   Final   Culture  Setup Time     Final   Value: 08/19/2013 17:18     Performed at [facility]     Final  Value: >=100,000 COLONIES/ML     Performed at [facility]   Culture     Final   Value: Multiple bacterial morphotypes present, none predominant. Suggest appropriate recollection if clinically indicated.     Performed at [facility]   Report Status 08/20/2013 FINAL   Final  CULTURE, BLOOD (ROUTINE X 2)     Status: None   Collection Time    08/19/13 12:30 PM      Result Value Ref Range Status   Specimen Description BLOOD WRIST LEFT   Final   Special Requests BOTTLES DRAWN AEROBIC AND ANAEROBIC 10CC   Final   Culture  Setup Time     Final   Value: 08/19/2013 17:32     Performed at [facility]   Culture     Final   Value:        BLOOD CULTURE RECEIVED NO GROWTH TO DATE CULTURE WILL BE HELD FOR 5 DAYS BEFORE ISSUING A FINAL NEGATIVE REPORT     Performed at [facility]   Report Status PENDING   Incomplete  CULTURE, BLOOD (ROUTINE X 2)     Status: None   Collection Time    08/19/13  1:42 PM      Result Value Ref Range Status   Specimen Description BLOOD ARM LEFT   Final   Special Requests BOTTLES DRAWN AEROBIC AND ANAEROBIC 10CC   Final   Culture  Setup Time     Final    Value: 08/19/2013 18:56     Performed at [facility]   Culture     Final   Value:        BLOOD CULTURE RECEIVED NO GROWTH TO DATE CULTURE WILL BE HELD FOR 5 DAYS BEFORE ISSUING A FINAL NEGATIVE REPORT     Performed at [facility]   Report Status PENDING   Incomplete  MRSA PCR SCREENING     Status: None   Collection Time    08/19/13  6:06 PM      Result Value Ref Range Status   MRSA by PCR NEGATIVE  NEGATIVE Final   Comment:            The GeneXpert MRSA Assay (FDA     approved for NASAL specimens     only), is one component of a     comprehensive MRSA colonization     surveillance program. It is not     intended to diagnose MRSA     infection nor to guide or     monitor treatment for     MRSA infections.   Studies/Results: No results found. Medications: I have reviewed the patient's current medications. Scheduled Meds: . azaTHIOprine  100 mg Oral Daily  . fluconazole  100 mg Oral Daily  . FLUoxetine  20 mg Oral Daily  . gabapentin  400 mg Oral TID  . insulin aspart  0-5 Units Subcutaneous QHS  . insulin aspart  0-9 Units Subcutaneous TID WC  . multivitamin with minerals  1 tablet Oral BID  . nicotine  21 mg Transdermal Daily  . piperacillin-tazobactam (ZOSYN)  IV  3.375 g Intravenous Q8H  . potassium chloride  20 mEq Oral Once  . predniSONE  40 mg Oral Q breakfast  . QUEtiapine  25 mg Oral QHS  . sodium chloride  3 mL Intravenous Q12H  . vancomycin  1,000 mg Intravenous Q12H   Continuous Infusions: . citrate dextrose    . citrate dextrose  PRN Meds:.acetaminophen, acetaminophen, diphenhydrAMINE, diphenhydrAMINE, ipratropium-albuterol Assessment/Plan: Suspected line infection - Day 6 Vanz/Zosyn  (Given hospitalization and frequent healthcare contact need to cover for MRSA and pseudomonas) will need at least 7 days treatment. - Blood cultures 4/20 NGTD, urine cultures not revealing. 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     - Imuran 100mg  daily - Prednisone 40mg  on non plasma exchange days --Next plasma exchange on sunday  Hypokalemia -KDur 68mEq x1  Possible CHF Patient had elevated proBNP on 4/13 presented with lower extremity edema, and dyspnea. - Obtain 2D Echo  Diet: Regular DVT PPx: SCDs (given TTP) Dispo: Disposition is deferred at this time, awaiting improvement of current medical problems.    The patient does have a current PCP Kirk Ruths, MD) and does not need an [facility] hospital follow-up appointment after discharge.  The patient does not have transportation limitations that hinder transportation to clinic appointments.  .Services Needed at time of discharge: Y = Yes, Blank = No PT:   OT:   RN:   Equipment:   Other:     LOS: 5 days   Joni Reining, DO 08/24/2013, 7:07 AM

## 2013-08-25 LAB — CULTURE, BLOOD (ROUTINE X 2)
CULTURE: NO GROWTH
Culture: NO GROWTH

## 2013-08-25 LAB — GLUCOSE, CAPILLARY
GLUCOSE-CAPILLARY: 82 mg/dL (ref 70–99)
GLUCOSE-CAPILLARY: 147 mg/dL — AB (ref 70–99)
Glucose-Capillary: 259 mg/dL — ABNORMAL HIGH (ref 70–99)
Glucose-Capillary: 111 mg/dL — ABNORMAL HIGH (ref 70–99)

## 2013-08-25 LAB — CBC
HCT: 26.7 % — ABNORMAL LOW (ref 36.0–46.0)
Hemoglobin: 8.4 g/dL — ABNORMAL LOW (ref 12.0–15.0)
MCH: 30.3 pg (ref 26.0–34.0)
MCHC: 31.5 g/dL (ref 30.0–36.0)
MCV: 96.4 fL (ref 78.0–100.0)
PLATELETS: 159 10*3/uL (ref 150–400)
RBC: 2.77 MIL/uL — AB (ref 3.87–5.11)
RDW: 15.5 % (ref 11.5–15.5)
WBC: 6.5 10*3/uL (ref 4.0–10.5)

## 2013-08-25 MED ORDER — ACD FORMULA A 0.73-2.45-2.2 GM/100ML VI SOLN
Status: AC
  Filled 2013-08-25: qty 500

## 2013-08-25 MED ORDER — DIPHENHYDRAMINE HCL 25 MG PO CAPS
25.0000 mg | ORAL_CAPSULE | Freq: Four times a day (QID) | ORAL | Status: DC | PRN
  Administered 2013-08-25: 25 mg via ORAL

## 2013-08-25 MED ORDER — ANTICOAGULANT SODIUM CITRATE 4% (200MG/5ML) IV SOLN
5.0000 mL | Freq: Once | Status: AC
  Administered 2013-08-25: 5 mL
  Filled 2013-08-25: qty 250

## 2013-08-25 MED ORDER — SODIUM CHLORIDE 0.9 % IV SOLN
4.0000 g | Freq: Once | INTRAVENOUS | Status: AC
  Administered 2013-08-25: 4 g via INTRAVENOUS
  Filled 2013-08-25 (×2): qty 40

## 2013-08-25 MED ORDER — ACD FORMULA A 0.73-2.45-2.2 GM/100ML VI SOLN
500.0000 mL | Status: DC
  Administered 2013-08-25: 500 mL via INTRAVENOUS
  Filled 2013-08-25: qty 500

## 2013-08-25 MED ORDER — CALCIUM CARBONATE ANTACID 500 MG PO CHEW
2.0000 | CHEWABLE_TABLET | ORAL | Status: AC
  Administered 2013-08-25: 400 mg via ORAL
  Filled 2013-08-25 (×2): qty 2

## 2013-08-25 MED ORDER — ACETAMINOPHEN 325 MG PO TABS: 650.0000 mg | ORAL_TABLET | ORAL | Status: DC | PRN

## 2013-08-25 MED ORDER — DIPHENHYDRAMINE HCL 25 MG PO CAPS
ORAL_CAPSULE | ORAL | Status: AC
  Filled 2013-08-25: qty 1

## 2013-08-25 NOTE — Progress Notes (Signed)
Subjective: Continues to be afebrile, doing very well, still on broad spectrum abx.  Only complaint is constipation, which she received Miralax. Objective: Vital signs in last 24 hours: Filed Vitals:   08/24/13 0604 08/24/13 1528 08/24/13 2132 08/25/13 0500  BP: 150/77 152/76 141/74 112/65  Pulse: 97 96 85 101  Temp: 98.6 F (37 C) 98.1 F (36.7 C) 98.3 F (36.8 C) 98 F (36.7 C)  TempSrc: Oral Oral Oral Oral  Resp: 18 18 18 18   Height:      Weight: 185 lb 3 oz (84 kg)   185 lb 13.6 oz (84.3 kg)  SpO2: 92% 94% 95% 96%   Weight change: 10.6 oz (0.3 kg)  Intake/Output Summary (Last 24 hours) at 08/25/13 1001 Last data filed at 08/24/13 2120  Gross per 24 hour  Intake    240 ml  Output      0 ml  Net    240 ml   General: NAD, sitting in chair eating breakfast Cardiac: RRR, no murmur Pulm/Chest: CTA B/L, no tenderness over cath site Abd: +BS soft NT/ND Ext: no pedal edema  Lab Results: Basic Metabolic Panel:  Recent Labs Lab 08/23/13 0730 08/23/13 1229 08/23/13 1255  NA 143 143 143  K 3.8 3.3* 3.4*  CL 103 97 99  CO2 29  --  29  GLUCOSE 91 139* 141*  BUN 26* 25* 26*  CREATININE 1.07 1.30* 1.14*  CALCIUM 8.6  --  8.3*   Liver Function Tests:  Recent Labs Lab 08/21/13 0950 08/23/13 1255  AST 25 31  ALT 28 28  ALKPHOS 49 57  BILITOT 0.5 0.4  PROT 6.2 6.0  ALBUMIN 2.9* 2.9*   CBC:  Recent Labs Lab 08/19/13 1214  08/23/13 1255 08/24/13 0645 08/25/13 0645  WBC 8.2  < > 7.9 7.3 6.5  NEUTROABS 6.7  --  6.9  --   --   HGB 7.9*  < > 8.7* 8.1* 8.4*  HCT 25.1*  < > 27.5* 25.5* 26.7*  MCV 98.0  < > 94.5 94.4 96.4  PLT 146*  < > 155 142* 159  < > = values in this interval not displayed. Cardiac Enzymes:  Recent Labs Lab 08/19/13 1238  TROPONINI <0.30   CBG:  Recent Labs Lab 08/23/13 2208 08/24/13 0811 08/24/13 1159 08/24/13 1658 08/24/13 2129 08/25/13 0801  GLUCAP 134* 94 185* 272* 166* 82   Hemoglobin A1C:  Recent Labs Lab  08/20/13 1335  HGBA1C 5.8*   Anemia Panel:  Recent Labs Lab 08/23/13 1255  RETICCTPCT 3.5*   Urinalysis:  Recent Labs Lab 08/19/13 1225  COLORURINE YELLOW  LABSPEC 1.018  PHURINE 7.0  GLUCOSEU NEGATIVE  HGBUR SMALL*  BILIRUBINUR NEGATIVE  KETONESUR NEGATIVE  PROTEINUR NEGATIVE  UROBILINOGEN 1.0  NITRITE NEGATIVE  LEUKOCYTESUR NEGATIVE   Micro Results: Recent Results (from the past 240 hour(s))  URINE CULTURE     Status: None   Collection Time    08/19/13 12:25 PM      Result Value Ref Range Status   Specimen Description URINE, CLEAN CATCH   Final   Special Requests NONE   Final   Culture  Setup Time     Final   Value: 08/19/2013 17:18     Performed at [facility]     Final   Value: >=100,000 COLONIES/ML     Performed at [facility]   Culture     Final   Value: Multiple bacterial morphotypes  present, none predominant. Suggest appropriate recollection if clinically indicated.     Performed at [facility]   Report Status 08/20/2013 FINAL   Final  CULTURE, BLOOD (ROUTINE X 2)     Status: None   Collection Time    08/19/13 12:30 PM      Result Value Ref Range Status   Specimen Description BLOOD WRIST LEFT   Final   Special Requests BOTTLES DRAWN AEROBIC AND ANAEROBIC 10CC   Final   Culture  Setup Time     Final   Value: 08/19/2013 17:32     Performed at [facility]   Culture     Final   Value:        BLOOD CULTURE RECEIVED NO GROWTH TO DATE CULTURE WILL BE HELD FOR 5 DAYS BEFORE ISSUING A FINAL NEGATIVE REPORT     Performed at [facility]   Report Status PENDING   Incomplete  CULTURE, BLOOD (ROUTINE X 2)     Status: None   Collection Time    08/19/13  1:42 PM      Result Value Ref Range Status   Specimen Description BLOOD ARM LEFT   Final   Special Requests BOTTLES DRAWN AEROBIC AND ANAEROBIC 10CC   Final   Culture  Setup Time     Final   Value: 08/19/2013 18:56     Performed at [facility]   Culture     Final   Value:        BLOOD CULTURE RECEIVED NO GROWTH TO DATE CULTURE WILL BE HELD FOR 5 DAYS BEFORE ISSUING A FINAL NEGATIVE REPORT     Performed at [facility]   Report Status PENDING   Incomplete  MRSA PCR SCREENING     Status: None   Collection Time    08/19/13  6:06 PM      Result Value Ref Range Status   MRSA by PCR NEGATIVE  NEGATIVE Final   Comment:            The GeneXpert MRSA Assay (FDA     approved for NASAL specimens     only), is one component of a     comprehensive MRSA colonization     surveillance program. It is not     intended to diagnose MRSA     infection nor to guide or     monitor treatment for     MRSA infections.   Studies/Results: No results found. Medications: I have reviewed the patient's current medications. Scheduled Meds: . azaTHIOprine  100 mg Oral Daily  . fluconazole  100 mg Oral Daily  . FLUoxetine  20 mg Oral Daily  . gabapentin  400 mg Oral TID  . insulin aspart  0-5 Units Subcutaneous QHS  . insulin aspart  0-9 Units Subcutaneous TID WC  . multivitamin with minerals  1 tablet Oral BID  . nicotine  21 mg Transdermal Daily  . piperacillin-tazobactam (ZOSYN)  IV  3.375 g Intravenous Q8H  . predniSONE  40 mg Oral Q breakfast  . QUEtiapine  25 mg Oral QHS  . sodium chloride  3 mL Intravenous Q12H  . vancomycin  1,000 mg Intravenous Q12H   Continuous Infusions: . citrate dextrose    . citrate dextrose     PRN Meds:.acetaminophen, acetaminophen, diphenhydrAMINE, diphenhydrAMINE, ipratropium-albuterol, polyethylene glycol Assessment/Plan: Suspected line infection - Day 7 Vanz/Zosyn can d/c abx after today and monitor closely. - Blood cultures 4/20 no growth, urine cultures  not revealing. 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 - IF platelets continue to remain stable may be able to D/C line on Monday.  COPD (chronic obstructive pulmonary disease)  - Bronchodilators pnr    TTP (thrombotic thrombocytopenic purpura)  - Platelets 159 today.     - Imuran 100mg  daily - Prednisone 40mg  on non plasma exchange days --Next plasma exchange today  Hypokalemia - Check K+ with pre exchange labs  Chronic diastolic CHF Elevated proBNP on 4/13, does not apprear grossly volume overloaded on exam.  TTE does confirm Grade 1 DD.  Constipation - Miralax PRN - Encourage ambulation  Diet: Regular DVT PPx: SCDs (given TTP) Dispo: Disposition is deferred at this time, awaiting improvement of current medical problems.  Will undergo plasma exchange today, if stable possible discharge tomorrow.  The patient does have a current PCP Kirk Ruths, MD) and does not need an [facility] hospital follow-up appointment after discharge.  The patient does not have transportation limitations that hinder transportation to clinic appointments.  .Services Needed at time of discharge: Y = Yes, Blank = No PT:   OT:   RN:   Equipment:   Other:     LOS: 6 days   Joni Reining, DO 08/25/2013, 10:01 AM

## 2013-08-26 DIAGNOSIS — I5032 Chronic diastolic (congestive) heart failure: Secondary | ICD-10-CM

## 2013-08-26 DIAGNOSIS — I509 Heart failure, unspecified: Secondary | ICD-10-CM

## 2013-08-26 LAB — BASIC METABOLIC PANEL
BUN: 19 mg/dL (ref 6–23)
CALCIUM: 9 mg/dL (ref 8.4–10.5)
CO2: 30 mEq/L (ref 19–32)
Chloride: 103 mEq/L (ref 96–112)
Creatinine, Ser: 0.95 mg/dL (ref 0.50–1.10)
GFR calc Af Amer: 68 mL/min — ABNORMAL LOW (ref >90)
GFR, EST NON AFRICAN AMERICAN: 59 mL/min — AB (ref >90)
GLUCOSE: 79 mg/dL (ref 70–99)
Potassium: 4.2 mEq/L (ref 3.7–5.3)
Sodium: 145 mEq/L (ref 137–147)

## 2013-08-26 LAB — CBC
HCT: 27.2 % — ABNORMAL LOW (ref 36.0–46.0)
Hemoglobin: 8.3 g/dL — ABNORMAL LOW (ref 12.0–15.0)
MCH: 29.5 pg (ref 26.0–34.0)
MCHC: 30.5 g/dL (ref 30.0–36.0)
MCV: 96.8 fL (ref 78.0–100.0)
Platelets: 190 10*3/uL (ref 150–400)
RBC: 2.81 MIL/uL — ABNORMAL LOW (ref 3.87–5.11)
RDW: 15.8 % — AB (ref 11.5–15.5)
WBC: 7.3 10*3/uL (ref 4.0–10.5)

## 2013-08-26 LAB — PREPARE FRESH FROZEN PLASMA
UNIT DIVISION: 0
UNIT DIVISION: 0
UNIT DIVISION: 0
Unit division: 0
Unit division: 0
Unit division: 0
Unit division: 0
Unit division: 0

## 2013-08-26 LAB — GLUCOSE, CAPILLARY
Glucose-Capillary: 83 mg/dL (ref 70–99)
Glucose-Capillary: 206 mg/dL — ABNORMAL HIGH (ref 70–99)

## 2013-08-26 NOTE — Discharge Summary (Signed)
Name: [name] MRN: [id number] DOB: 24-Sep-1940 73 y.o. PCP: Kirk Ruths, MD  Date of Admission: 08/19/2013 12:01 PM Date of Discharge: 08/26/2013 Attending Physician: Dominic Pea, DO  Discharge Diagnosis: Principal Problem:   TTP (thrombotic thrombocytopenic purpura) Active Problems:   Sepsis(995.91)   COPD (chronic obstructive pulmonary disease)   History of plasmapheresis   Fever   Pulmonary edema  Discharge Medications:   Medication List         azaTHIOprine 50 MG tablet  Commonly known as:  IMURAN  Take 100 mg by mouth daily.     cholecalciferol 400 UNITS Tabs tablet  Commonly known as:  VITAMIN D  Take 400 Units by mouth.     cyanocobalamin 100 MCG tablet  Take 1,000 mcg by mouth daily.     fluconazole 100 MG tablet  Commonly known as:  DIFLUCAN  Take 100 mg by mouth daily.     FLUoxetine 20 MG capsule  Commonly known as:  PROZAC  Take 20 mg by mouth daily.     folic acid 1 MG tablet  Commonly known as:  FOLVITE  Take 1 mg by mouth daily.     gabapentin 400 MG capsule  Commonly known as:  NEURONTIN  Take 400 mg by mouth 3 (three) times daily.     multivitamin tablet  Take 1 tablet by mouth 2 (two) times daily.     phenol-menthol 14.5 MG lozenge  Place 1 lozenge inside cheek as needed for sore throat.     potassium chloride SA 20 MEQ tablet  Commonly known as:  K-DUR,KLOR-CON  Take 20 mEq by mouth daily.     predniSONE 20 MG tablet  Commonly known as:  DELTASONE  Take 2 tablets (40 mg total) by mouth daily with breakfast. Only on days not receiving Plasma exchange.     QUEtiapine 25 MG tablet  Commonly known as:  SEROQUEL  Take 25 mg by mouth at bedtime.     tiotropium 18 MCG inhalation capsule  Commonly known as:  SPIRIVA  Place 18 mcg into inhaler and inhale daily.        Disposition and follow-up:   [name] was discharged from [facility] in Stable condition.  At the hospital follow  up visit please address:  1.  Eval Catheter site.  Patient found to have mild pulmonary vascular congestions and elevated proBNP and TTE showed grade 1 DD.  Please reevlaute her volume status on hospital follow up especially given chronic steroid use.  2.  Labs / imaging needed at time of follow-up: CBC, BMP  3.  Pending labs/ test needing follow-up: None  Follow-up Appointments:     Follow-up Information   Follow up with Annia Belt, MD On 09/02/2013. (at 1:30pm)    Specialty:  Oncology   Contact information:   [address] [place] [address] [phone]       Discharge Instructions: Discharge Orders   Future Appointments Provider Department Dept Phone   09/03/2013 10:00 AM [facility] Only [facility] [phone]   10/01/2013 10:00 AM [facility] Only [facility] [phone]   11/05/2013 10:00 AM [facility] Only [facility] [phone]   Future Orders Complete By Expires   (HEART FAILURE PATIENTS) Call MD:  Anytime you have any of the following symptoms: 1) 3 pound weight gain in 24 hours or 5 pounds in 1 week 2) shortness  of breath, with or without a dry hacking cough 3) swelling in the hands, feet or stomach 4) if you have to sleep on extra pillows at night in order to breathe.  As directed    Call MD for:  difficulty breathing, headache or visual disturbances  As directed    Call MD for:  extreme fatigue  As directed    Call MD for:  redness, tenderness, or signs of infection (pain, swelling, redness, odor or green/yellow discharge around incision site)  As directed    Call MD for:  temperature >100.4  As directed    Diet - low sodium heart healthy  As directed    Increase activity slowly  As directed       Consultations: Treatment Team:  Annia Belt, MD  Procedures Performed:  Dg Chest 2 View  08/12/2013   CLINICAL DATA:  Cough and fever  EXAM: CHEST  2 VIEW   COMPARISON:  July 12, 2013  FINDINGS: Central catheter tip is at cavoatrial junction. No pneumothorax. There is underlying emphysematous change. There is no edema or consolidation. The heart size is normal. The pulmonary vascularity reflects underlying emphysematous change.  IMPRESSION: Underlying emphysematous change. No edema or consolidation. No pneumothorax.   Electronically Signed   By: Lowella Grip M.D.   On: 08/12/2013 12:54   Dg Chest Port 1 View  08/20/2013   CLINICAL DATA:  Cough, congestion  EXAM: PORTABLE CHEST - 1 VIEW  COMPARISON:  DG CHEST 1V PORT dated 08/19/2013  FINDINGS: There is a dual-lumen left-sided central venous catheter in satisfactory position. There is bilateral mild interstitial thickening. No pleural effusion or pneumothorax. The heart and mediastinal contours are unremarkable.  The osseous structures are unremarkable.  IMPRESSION: Bilateral mild interstitial thickening which may reflect mild interstitial edema versus mild bronchitis or atypical infection.   Electronically Signed   By: Kathreen Devoid   On: 08/20/2013 09:49   Dg Chest Port 1 View  (if Code Sepsis Called)  08/19/2013   CLINICAL DATA:  Fever, shortness of breath, weakness, confusion, history COPD, smoking  EXAM: PORTABLE CHEST - 1 VIEW  COMPARISON:  Portable exam 1229 hr compared to 08/12/2013  FINDINGS: Left subclavian dual-lumen central venous catheter unchanged distal tip projecting over cavoatrial junction.  Upper normal heart size.  Calcified tortuous aorta.  Mediastinal contours and pulmonary vascularity normal.  Bronchitic changes and right basilar atelectasis.  No gross infiltrate, pleural effusion or pneumothorax.  Osseous demineralization.  IMPRESSION: Mild bronchitic changes with right basilar atelectasis.   Electronically Signed   By: Lavonia Dana M.D.   On: 08/19/2013 12:52   Dg Foot Complete Right  08/19/2013   CLINICAL DATA:  Foot pain, swelling, bruise  EXAM: RIGHT FOOT COMPLETE - 3+ VIEW   COMPARISON:  None.  FINDINGS: No acute fracture or malalignment. Normal bony mineralization. Soft tissue swelling over the dorsal aspect of the foot. No significant degenerative change. No ankle joint effusion.  IMPRESSION: No acute osseous abnormality. Soft tissue swelling over the dorsal forefoot.   Electronically Signed   By: Jacqulynn Cadet M.D.   On: 08/19/2013 22:32    2D Echo: 08/24/13 Study Conclusions  - Left ventricle: The cavity size was normal. Wall thickness was increased in a pattern of moderate LVH. Systolic function was normal. The estimated ejection fraction was in the range of 55% to 60%. Wall motion was normal; there were no regional wall motion abnormalities. Doppler parameters are consistent with abnormal left ventricular relaxation (grade  1 diastolic dysfunction). - Aortic valve: Mild regurgitation. - Left atrium: The atrium was mildly dilated.  Admission HPI: This is a 73yo F with PMH COPD, tobacco abuse, TTP (followed by Dr. 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 Patient was recently admitted to [facility] from 4/13-4/17 for similar complaints to malaise, fever, nonproductive cough. Patient was treated w/ empiric IV vancomycin x 3 days for possible bacteremia 2/2 line infection (vascular cath to L subclavian). BCx negative. Resp virus panel negative. However, patient's catheter site did not look infected and patient improved and was discharged back home without antibiotics.  According to Dr. Beryle Beams, patient's symptoms had improved when he saw her in the office on Friday. Daughter does state that patient's cough is still present, but is improving. Patient has undergone daily plasmapheresis since 4/3 up until last Thursday when Dr. Beryle Beams skipped a treatment 2/2  plateauing platelet levels. Plasmapheresis was done again on Friday and Saturday and pt then skipped treatment on Sunday. Pt also on chronic steroids for her TTP (receiving prednisone 60mg  IV w/ each plasmapheresis session and taking prednisone 40mg  PO at home on skipped days). Per daughter, patient had been doing well and acting her normal self until late last night when she became increasingly drowsy and spiked fever. Denies abd pain, N/V/D, chills, dysuria, CP, SOB.  Pt with soft blood pressure in the ED to 90s/50s, fever to 101 rectally, HR 106 satting 92-100% on 4LPM [place]. She received 4L NS and was started on vanc/zosyn. No leukocytosis. PLT level 146K, which is improved from 4/18 when it was 131K. Trop neg x 1. Lactate 1.1. LDH elevated at 356. BCX x 2 drawn. CXR showed mild bronchitic changes w/ R basilar atelectasis. Patient received duoneb and solumedrol IV in ED for wheezing. PCCM was consulted, though did not think pt appropriate for ICU at this time.   Hospital Course by problem list:   Sepsis Patient was admitted to the hospital with SIRS criteria.  Given that she has had a central line for >1 month and was admitted for a fever 1 week prior with suspected line infection, a line infection was again suspected as the source.  She was started on broad spectrum antibiotics (IV Vancomycin and IV Zosyn) given her multiple recent contacts with healthcare and recent hospitalization.  Shortly after admission her family requested transfer for ICU for intensive care, the next morning she was transferred out of the ICU and remained stable throughout her hospitalization.  She was continued on broad spectrum IV antibiotics for 7 days.  She had no tenderness of signs of infection at the cathter site and her blood cultures (both on 4/13 and 4/20) remained negative.  The line was left in given her poor vascular access and need for every other day plasma exchange for her TTP relapse.  This was extensively discussed  with her hematologist Dr. Beryle Beams.  On discharge she was given strict return precautions.    TTP (thrombotic thrombocytopenic purpura) -Patient is currently undergoing treatment for her third relapse of TTP.  Her treatment of every other day plasma exchange, azathioprine, and steroids.  Her case was discussed frequently with her hematologist Dr. Beryle Beams. Her platelets showed improvement during her hospitalization and she was discharged with close follow up with Dr. Beryle Beams.  Her platelets were 190 on discharge  COPD (chronic obstructive pulmonary disease) -She was managed with prn bronchodilators.  Chronic diastolic CHF  Elevated proBNP on 4/13, patient had some mild pulmonary edema, a  TTE was preformed which confirmed Grade 1 DD.  She had no respiratory distress.   Discharge Vitals:   BP 130/60  Pulse 82  Temp(Src) 98.4 F (36.9 C) (Oral)  Resp 18  Ht 5' 4.17" (1.63 m)  Wt 167 lb 15.9 oz (76.2 kg)  BMI 28.68 kg/m2  SpO2 93%  Discharge Labs:  Results for orders placed during the hospital encounter of 08/19/13 (from the past 24 hour(s))  PREPARE FRESH FROZEN PLASMA     Status: None   Collection Time    08/25/13  4:45 PM      Result Value Ref Range   Unit Number [id number]     Blood Component Type THAWED PLASMA     Unit division 00     Status of Unit ISSUED,FINAL     Transfusion Status OK TO TRANSFUSE     Unit Number [id number]     Blood Component Type THAWED PLASMA     Unit division 00     Status of Unit ISSUED,FINAL     Transfusion Status OK TO TRANSFUSE     Unit Number [id number]     Blood Component Type THAWED PLASMA     Unit division 00     Status of Unit ISSUED,FINAL     Transfusion Status OK TO TRANSFUSE     Unit Number [id number]     Blood Component Type THAWED PLASMA     Unit division 00     Status of Unit ISSUED,FINAL     Transfusion Status OK TO TRANSFUSE     Unit Number [id number]     Blood Component Type THAWED PLASMA      Unit division 00     Status of Unit ISSUED,FINAL     Transfusion Status OK TO TRANSFUSE     Unit Number [id number]     Blood Component Type THAWED PLASMA     Unit division 00     Status of Unit ISSUED,FINAL     Transfusion Status OK TO TRANSFUSE     Unit Number [id number]     Blood Component Type THAWED PLASMA     Unit division 00     Status of Unit ISSUED,FINAL     Transfusion Status OK TO TRANSFUSE     Unit Number [id number]     Blood Component Type THAWED PLASMA     Unit division 00     Status of Unit ISSUED,FINAL     Transfusion Status OK TO TRANSFUSE    GLUCOSE, CAPILLARY     Status: Abnormal   Collection Time    08/25/13  5:19 PM      Result Value Ref Range   Glucose-Capillary 259 (*) 70 - 99 mg/dL  GLUCOSE, CAPILLARY     Status: Abnormal   Collection Time    08/25/13 10:40 PM      Result Value Ref Range   Glucose-Capillary 111 (*) 70 - 99 mg/dL   Comment 1 Documented in Chart     Comment 2 Notify RN    CBC     Status: Abnormal   Collection Time    08/26/13  5:40 AM      Result Value Ref Range   WBC 7.3  4.0 - 10.5 K/uL   RBC 2.81 (*) 3.87 - 5.11 MIL/uL   Hemoglobin 8.3 (*) 12.0 -  15.0 g/dL   HCT 27.2 (*) 36.0 - 46.0 %   MCV 96.8  78.0 - 100.0 fL   MCH 29.5  26.0 - 34.0 pg   MCHC 30.5  30.0 - 36.0 g/dL   RDW 15.8 (*) 11.5 - 15.5 %   Platelets 190  150 - 400 K/uL  BASIC METABOLIC PANEL     Status: Abnormal   Collection Time    08/26/13  5:40 AM      Result Value Ref Range   Sodium 145  137 - 147 mEq/L   Potassium 4.2  3.7 - 5.3 mEq/L   Chloride 103  96 - 112 mEq/L   CO2 30  19 - 32 mEq/L   Glucose, Bld 79  70 - 99 mg/dL   BUN 19  6 - 23 mg/dL   Creatinine, Ser 0.95  0.50 - 1.10 mg/dL   Calcium 9.0  8.4 - 10.5 mg/dL   GFR calc non Af Amer 59 (*) >90 mL/min   GFR calc Af Amer 68 (*) >90 mL/min  GLUCOSE, CAPILLARY     Status: None   Collection Time    08/26/13  7:43 AM      Result Value Ref Range   Glucose-Capillary 83  70 - 99 mg/dL     Signed: Joni Reining, DO 08/26/2013, 12:16 PM   Time Spent on Discharge: 35 minutes Services Ordered on Discharge: none Equipment Ordered on Discharge: none

## 2013-08-26 NOTE — Progress Notes (Signed)
  Date: 08/26/2013  Patient name: [name]  Medical record number: [id number]  Date of birth: [birth year]   This patient has been seen and the plan of care was discussed with the house staff. Please see their note for complete details. I concur with their findings with the following additions/corrections: Clinically well. Eager to go home. No evidence of ongoing hemolysis. BC NGTD. Catheter side looks C/D/I. Plans for additional plasma exchanges, continued imuran, and prednisone. Medically stable for D/C home today.  Dominic Pea, DO, [facility] 08/26/2013, 12:00 PM

## 2013-08-26 NOTE — Progress Notes (Signed)
Nsg Discharge Note  Admit Date:  08/19/2013 Discharge date: 08/26/2013   [name] to be D/C'd Home per MD order.  AVS completed.  Copy for chart, and copy for patient signed, and dated. Patient/caregiver able to verbalize understanding.  Discharge Medication:   Medication List         azaTHIOprine 50 MG tablet  Commonly known as:  IMURAN  Take 100 mg by mouth daily.     cholecalciferol 400 UNITS Tabs tablet  Commonly known as:  VITAMIN D  Take 400 Units by mouth.     cyanocobalamin 100 MCG tablet  Take 1,000 mcg by mouth daily.     fluconazole 100 MG tablet  Commonly known as:  DIFLUCAN  Take 100 mg by mouth daily.     FLUoxetine 20 MG capsule  Commonly known as:  PROZAC  Take 20 mg by mouth daily.     folic acid 1 MG tablet  Commonly known as:  FOLVITE  Take 1 mg by mouth daily.     gabapentin 400 MG capsule  Commonly known as:  NEURONTIN  Take 400 mg by mouth 3 (three) times daily.     multivitamin tablet  Take 1 tablet by mouth 2 (two) times daily.     phenol-menthol 14.5 MG lozenge  Place 1 lozenge inside cheek as needed for sore throat.     potassium chloride SA 20 MEQ tablet  Commonly known as:  K-DUR,KLOR-CON  Take 20 mEq by mouth daily.     predniSONE 20 MG tablet  Commonly known as:  DELTASONE  Take 2 tablets (40 mg total) by mouth daily with breakfast. Only on days not receiving Plasma exchange.     QUEtiapine 25 MG tablet  Commonly known as:  SEROQUEL  Take 25 mg by mouth at bedtime.     tiotropium 18 MCG inhalation capsule  Commonly known as:  SPIRIVA  Place 18 mcg into inhaler and inhale daily.        Discharge Assessment: Filed Vitals:   08/26/13 0505  BP: 130/60  Pulse: 82  Temp: 98.4 F (36.9 C)  Resp: 18   Skin clean, dry and intact without evidence of skin break down, no evidence of skin tears noted. IV catheter discontinued intact. Site without signs and symptoms of complications - no redness or edema noted at  insertion site, patient denies c/o pain - only slight tenderness at site.  Dressing with slight pressure applied.  D/c Instructions-Education: Discharge instructions given to patient/family with verbalized understanding. D/c education completed with patient/family including follow up instructions, medication list, d/c activities limitations if indicated, with other d/c instructions as indicated by MD - patient able to verbalize understanding, all questions fully answered. Patient instructed to return to ED, call 911, or call MD for any changes in condition.  Patient escorted via [place], and D/C home via private auto.  Dayle Points, RN 08/26/2013 1:46 PM

## 2013-08-26 NOTE — Progress Notes (Signed)
Patient ID: [name], female   DOB: 1941-04-30, 73 y.o.   MRN: [id number] Afebrile day 7 of 7 antibiotics Blood cultures remain  Negative Platelets up to 190,000 Hb stable @ 8.3  without further traansfusion Unexplained, progressive rise in LDH with normal liver functions/bilirubin Exam: Cushingoid; Lungs clear. Pharynx no exudate; regular rhythm; abdomen soft non-tender; neuro non focal Impression: #1. Culture negative sepsis: resolved w empiric Rx #2. 3rd relapse of TTP - appears to be responding to plasma exchange at this time I will plan on 2 additional exchanges this week on Tuesday & Thursday then stop & observe if otherwise stable. Change prednisone to 40 mg on exchange days and decrease to 20 mg on off days. Continue Imuran. #3.  Candida pharyngiits - resolved on diflucan #4. Unexplained elevation of LDH with no gross evidence for ongoing hemolysis #5. COPD #6. Recent acute bronchitis. #7. Chronic Depression #8. Tobacco addiction  Anticipate home today.  Please schedule her to see me on Monday 5/4@ 1:30 PM for 30 minutes. Thanks for your help!

## 2013-08-26 NOTE — Progress Notes (Signed)
Subjective: Feels well no complaints, had BM this AM. Did get called back by nurse at 12pm that patient was tachycardic however per patient his was right after she waked back from bathroom Objective: Vital signs in last 24 hours: Filed Vitals:   08/25/13 2122 08/25/13 2215 08/26/13 0500 08/26/13 0505  BP: 177/93 157/75  130/60  Pulse: 125 85  82  Temp: 98.2 F (36.8 C)   98.4 F (36.9 C)  TempSrc: Oral   Oral  Resp: 18   18  Height:      Weight:   167 lb 15.9 oz (76.2 kg)   SpO2: 94%   93%   Weight change: -18 lb 1.2 oz (-8.2 kg)  Intake/Output Summary (Last 24 hours) at 08/26/13 1151 Last data filed at 08/26/13 0945  Gross per 24 hour  Intake    890 ml  Output      0 ml  Net    890 ml   General: NAD, sitting in chair Cardiac: RRR, very soft diastolic murmur (mild aortic regurg on echo) Pulm/Chest: CTA B/L, no tenderness over cath site Abd: +BS soft NT/ND Ext: no pedal edema  Lab Results: Basic Metabolic Panel:  Recent Labs Lab 08/23/13 1255 08/26/13 0540  NA 143 145  K 3.4* 4.2  CL 99 103  CO2 29 30  GLUCOSE 141* 79  BUN 26* 19  CREATININE 1.14* 0.95  CALCIUM 8.3* 9.0   Liver Function Tests:  Recent Labs Lab 08/21/13 0950 08/23/13 1255  AST 25 31  ALT 28 28  ALKPHOS 49 57  BILITOT 0.5 0.4  PROT 6.2 6.0  ALBUMIN 2.9* 2.9*   CBC:  Recent Labs Lab 08/19/13 1214  08/23/13 1255  08/25/13 0645 08/26/13 0540  WBC 8.2  < > 7.9  < > 6.5 7.3  NEUTROABS 6.7  --  6.9  --   --   --   HGB 7.9*  < > 8.7*  < > 8.4* 8.3*  HCT 25.1*  < > 27.5*  < > 26.7* 27.2*  MCV 98.0  < > 94.5  < > 96.4 96.8  PLT 146*  < > 155  < > 159 190  < > = values in this interval not displayed. Cardiac Enzymes:  Recent Labs Lab 08/19/13 1238  TROPONINI <0.30   CBG:  Recent Labs Lab 08/24/13 2129 08/25/13 0801 08/25/13 1214 08/25/13 1719 08/25/13 2240 08/26/13 0743  GLUCAP 166* 82 147* 259* 111* 83   Hemoglobin A1C:  Recent Labs Lab 08/20/13 1335  HGBA1C  5.8*   Anemia Panel:  Recent Labs Lab 08/23/13 1255  RETICCTPCT 3.5*   Urinalysis:  Recent Labs Lab 08/19/13 1225  COLORURINE YELLOW  LABSPEC 1.018  PHURINE 7.0  GLUCOSEU NEGATIVE  HGBUR SMALL*  BILIRUBINUR NEGATIVE  KETONESUR NEGATIVE  PROTEINUR NEGATIVE  UROBILINOGEN 1.0  NITRITE NEGATIVE  LEUKOCYTESUR NEGATIVE   Micro Results: Recent Results (from the past 240 hour(s))  URINE CULTURE     Status: None   Collection Time    08/19/13 12:25 PM      Result Value Ref Range Status   Specimen Description URINE, CLEAN CATCH   Final   Special Requests NONE   Final   Culture  Setup Time     Final   Value: 08/19/2013 17:18     Performed at [facility]     Final   Value: >=100,000 COLONIES/ML     Performed at [facility]  Culture     Final   Value: Multiple bacterial morphotypes present, none predominant. Suggest appropriate recollection if clinically indicated.     Performed at [facility]   Report Status 08/20/2013 FINAL   Final  CULTURE, BLOOD (ROUTINE X 2)     Status: None   Collection Time    08/19/13 12:30 PM      Result Value Ref Range Status   Specimen Description BLOOD WRIST LEFT   Final   Special Requests BOTTLES DRAWN AEROBIC AND ANAEROBIC 10CC   Final   Culture  Setup Time     Final   Value: 08/19/2013 17:32     Performed at [facility]   Culture     Final   Value: NO GROWTH 5 DAYS     Performed at [facility]   Report Status 08/25/2013 FINAL   Final  CULTURE, BLOOD (ROUTINE X 2)     Status: None   Collection Time    08/19/13  1:42 PM      Result Value Ref Range Status   Specimen Description BLOOD ARM LEFT   Final   Special Requests BOTTLES DRAWN AEROBIC AND ANAEROBIC 10CC   Final   Culture  Setup Time     Final   Value: 08/19/2013 18:56     Performed at [facility]   Culture     Final   Value: NO GROWTH 5 DAYS     Performed at [facility]   Report Status 08/25/2013  FINAL   Final  MRSA PCR SCREENING     Status: None   Collection Time    08/19/13  6:06 PM      Result Value Ref Range Status   MRSA by PCR NEGATIVE  NEGATIVE Final   Comment:            The GeneXpert MRSA Assay (FDA     approved for NASAL specimens     only), is one component of a     comprehensive MRSA colonization     surveillance program. It is not     intended to diagnose MRSA     infection nor to guide or     monitor treatment for     MRSA infections.   Studies/Results: No results found. Medications: I have reviewed the patient's current medications. Scheduled Meds: . azaTHIOprine  100 mg Oral Daily  . fluconazole  100 mg Oral Daily  . FLUoxetine  20 mg Oral Daily  . gabapentin  400 mg Oral TID  . insulin aspart  0-5 Units Subcutaneous QHS  . insulin aspart  0-9 Units Subcutaneous TID WC  . multivitamin with minerals  1 tablet Oral BID  . nicotine  21 mg Transdermal Daily  . predniSONE  40 mg Oral Q breakfast  . QUEtiapine  25 mg Oral QHS  . sodium chloride  3 mL Intravenous Q12H   Continuous Infusions: . citrate dextrose    . citrate dextrose    . citrate dextrose     PRN Meds:.acetaminophen, acetaminophen, acetaminophen, diphenhydrAMINE, diphenhydrAMINE, diphenhydrAMINE, ipratropium-albuterol, polyethylene glycol Assessment/Plan: Possible line infection - Blood cultures were negative, No other sign of infection but patient did clinically improve on empiric broad spectrum antibiotics.  She has completed a 7 day course of antibiotics.  There are no current signs of line infection. - Will discharge patient home today, she was given strict return precautions.  COPD (chronic obstructive pulmonary disease)  - Bronchodilators pnr  TTP (thrombotic thrombocytopenic purpura)  - Platelets 190 today.     - Imuran 100mg  daily - Prednisone 40mg  on non plasma exchange days - Will continue QOD plasma exchange per Dr. Darnell Level, follow up scheduled for Monday  5/4  Hypokalemia-resolved  Chronic diastolic CHF Elevated proBNP on 4/13, does not apprear grossly volume overloaded on exam.  TTE does confirm Grade 1 DD.  Constipation-resolved  Diet: Regular DVT PPx: SCDs (given TTP) Dispo: Discharge home today  The patient does have a current PCP Kirk Ruths, MD) and does not need an [facility] hospital follow-up appointment after discharge.  The patient does not have transportation limitations that hinder transportation to clinic appointments.  .Services Needed at time of discharge: Y = Yes, Blank = No PT:   OT:   RN:   Equipment:   Other:     LOS: 7 days   Joni Reining, DO 08/26/2013, 11:51 AM

## 2013-08-26 NOTE — Discharge Instructions (Signed)
Please continue you medications as discussed with Dr. Beryle Beams.  You will take prednisone 40mg  only on non plasma exchange days. Please call or go to the ED if you develop any pain, fever, chills, generalized weakness, trouble breathing, or excessive fatigue. You will follow up with Dr. Beryle Beams on Monday at 1:30pm

## 2013-08-27 ENCOUNTER — Other Ambulatory Visit: Payer: Self-pay | Admitting: Oncology

## 2013-08-27 ENCOUNTER — Non-Acute Institutional Stay (HOSPITAL_COMMUNITY)
Admission: AD | Admit: 2013-08-27 | Discharge: 2013-08-27 | Disposition: A | Discharge status: Home / Self Care | Payer: Medicare Other | Source: Ambulatory Visit | Attending: Oncology | Admitting: Oncology

## 2013-08-27 DIAGNOSIS — M311 Thrombotic microangiopathy, unspecified: Secondary | ICD-10-CM | POA: Insufficient documentation

## 2013-08-27 DIAGNOSIS — M3119 Other thrombotic microangiopathy: Secondary | ICD-10-CM

## 2013-08-27 LAB — CBC WITH DIFFERENTIAL/PLATELET
Basophils Absolute: 0 10*3/uL (ref 0.0–0.1)
Basophils Relative: 0 % (ref 0–1)
EOS ABS: 0.1 10*3/uL (ref 0.0–0.7)
Eosinophils Relative: 1 % (ref 0–5)
HEMATOCRIT: 28.3 % — AB (ref 36.0–46.0)
HEMOGLOBIN: 8.6 g/dL — AB (ref 12.0–15.0)
LYMPHS PCT: 16 % (ref 12–46)
Lymphs Abs: 1.2 10*3/uL (ref 0.7–4.0)
MCH: 29.4 pg (ref 26.0–34.0)
MCHC: 30.4 g/dL (ref 30.0–36.0)
MCV: 96.6 fL (ref 78.0–100.0)
Monocytes Absolute: 0.4 10*3/uL (ref 0.1–1.0)
Monocytes Relative: 6 % (ref 3–12)
NRBC: 3 /100{WBCs} — AB
Neutro Abs: 5.7 10*3/uL (ref 1.7–7.7)
Neutrophils Relative %: 77 % (ref 43–77)
Platelets: 209 10*3/uL (ref 150–400)
RBC: 2.93 MIL/uL — ABNORMAL LOW (ref 3.87–5.11)
RDW: 15.9 % — AB (ref 11.5–15.5)
WBC: 7.4 10*3/uL (ref 4.0–10.5)

## 2013-08-27 LAB — COMPREHENSIVE METABOLIC PANEL
ALT: 33 U/L (ref 0–35)
AST: 28 U/L (ref 0–37)
Albumin: 3.1 g/dL — ABNORMAL LOW (ref 3.5–5.2)
Alkaline Phosphatase: 59 U/L (ref 39–117)
BUN: 25 mg/dL — ABNORMAL HIGH (ref 6–23)
CALCIUM: 8.6 mg/dL (ref 8.4–10.5)
CO2: 23 meq/L (ref 19–32)
Chloride: 102 mEq/L (ref 96–112)
Creatinine, Ser: 0.79 mg/dL (ref 0.50–1.10)
GFR calc Af Amer: >90 mL/min (ref >90)
GFR, EST NON AFRICAN AMERICAN: 82 mL/min — AB (ref >90)
Glucose, Bld: 133 mg/dL — ABNORMAL HIGH (ref 70–99)
Potassium: 3.1 mEq/L — ABNORMAL LOW (ref 3.7–5.3)
SODIUM: 143 meq/L (ref 137–147)
TOTAL PROTEIN: 6.3 g/dL (ref 6.0–8.3)
Total Bilirubin: 0.3 mg/dL (ref 0.3–1.2)

## 2013-08-27 LAB — RETICULOCYTES
RBC.: 2.93 MIL/uL — ABNORMAL LOW (ref 3.87–5.11)
RETIC COUNT ABSOLUTE: 193.4 10*3/uL — AB (ref 19.0–186.0)
Retic Ct Pct: 6.6 % — ABNORMAL HIGH (ref 0.4–3.1)

## 2013-08-27 LAB — POCT I-STAT, CHEM 8
BUN: 24 mg/dL — ABNORMAL HIGH (ref 6–23)
CALCIUM ION: 1.14 mmol/L (ref 1.13–1.30)
Chloride: 102 mEq/L (ref 96–112)
Creatinine, Ser: 0.9 mg/dL (ref 0.50–1.10)
GLUCOSE: 125 mg/dL — AB (ref 70–99)
HEMATOCRIT: 45 % (ref 36.0–46.0)
Hemoglobin: 15.3 g/dL — ABNORMAL HIGH (ref 12.0–15.0)
Potassium: 2.9 mEq/L — CL (ref 3.7–5.3)
Sodium: 144 mEq/L (ref 137–147)
TCO2: 23 mmol/L (ref 0–100)

## 2013-08-27 LAB — LACTATE DEHYDROGENASE: LDH: 396 U/L — ABNORMAL HIGH (ref 94–250)

## 2013-08-27 MED ORDER — ACD FORMULA A 0.73-2.45-2.2 GM/100ML VI SOLN: 500.0000 mL | Status: DC

## 2013-08-27 MED ORDER — ACETAMINOPHEN 325 MG PO TABS: 650.0000 mg | ORAL_TABLET | ORAL | Status: DC | PRN

## 2013-08-27 MED ORDER — DIPHENHYDRAMINE HCL 25 MG PO CAPS: 25.0000 mg | ORAL_CAPSULE | Freq: Four times a day (QID) | ORAL | Status: DC | PRN

## 2013-08-27 MED ORDER — SODIUM CHLORIDE 0.9 % IV SOLN
4.0000 g | Freq: Once | INTRAVENOUS | Status: AC
  Administered 2013-08-27: 4 g via INTRAVENOUS
  Filled 2013-08-27: qty 40

## 2013-08-27 MED ORDER — CALCIUM CARBONATE ANTACID 500 MG PO CHEW
2.0000 | CHEWABLE_TABLET | ORAL | Status: DC
  Administered 2013-08-27: 400 mg via ORAL

## 2013-08-27 MED ORDER — ACD FORMULA A 0.73-2.45-2.2 GM/100ML VI SOLN
Status: AC
  Administered 2013-08-27: 15:00:00
  Filled 2013-08-27: qty 500

## 2013-08-27 MED ORDER — METHYLPREDNISOLONE SODIUM SUCC 125 MG IJ SOLR
INTRAMUSCULAR | Status: AC
Start: 2013-08-27 — End: 2013-08-27
  Filled 2013-08-27: qty 2

## 2013-08-27 MED ORDER — METHYLPREDNISOLONE SODIUM SUCC 125 MG IJ SOLR
40.0000 mg | Freq: Once | INTRAMUSCULAR | Status: AC
  Administered 2013-08-27: 40 mg via INTRAVENOUS

## 2013-08-27 MED ORDER — ANTICOAGULANT SODIUM CITRATE 4% (200MG/5ML) IV SOLN
5.0000 mL | Freq: Once | Status: AC
  Administered 2013-08-27: 5 mL
  Filled 2013-08-27: qty 250

## 2013-08-27 MED ORDER — CALCIUM CARBONATE ANTACID 500 MG PO CHEW
CHEWABLE_TABLET | ORAL | Status: AC
  Filled 2013-08-27: qty 2

## 2013-08-27 MED ORDER — DIPHENHYDRAMINE HCL 25 MG PO CAPS
ORAL_CAPSULE | ORAL | Status: AC
  Administered 2013-08-27: 25 mg
  Filled 2013-08-27: qty 1

## 2013-08-27 NOTE — Progress Notes (Signed)
TPE complete without adverse events.  Vital signs stable and pt without complaint. Pt aware of next appointment scheduled  Thursday, April 30,2015 @ 2pm.  Pt discharged to home/self care.

## 2013-08-28 LAB — THERAPEUTIC PLASMA EXCHANGE (BLOOD BANK)
Plasma volume needed: 2600
UNIT DIVISION: 0
UNIT DIVISION: 0
Unit division: 0
Unit division: 0
Unit division: 0
Unit division: 0
Unit division: 0
Unit division: 0
Unit division: 0

## 2013-08-29 ENCOUNTER — Other Ambulatory Visit: Payer: Self-pay | Admitting: Oncology

## 2013-08-29 ENCOUNTER — Non-Acute Institutional Stay (HOSPITAL_COMMUNITY)
Admission: AD | Admit: 2013-08-29 | Discharge: 2013-08-29 | Disposition: A | Discharge status: Home / Self Care | Payer: Medicare Other | Source: Other Acute Inpatient Hospital | Attending: Oncology | Admitting: Oncology

## 2013-08-29 DIAGNOSIS — M3119 Other thrombotic microangiopathy: Secondary | ICD-10-CM

## 2013-08-29 DIAGNOSIS — M311 Thrombotic microangiopathy, unspecified: Secondary | ICD-10-CM | POA: Insufficient documentation

## 2013-08-29 LAB — CBC WITH DIFFERENTIAL/PLATELET
BASOS ABS: 0 10*3/uL (ref 0.0–0.1)
BASOS PCT: 0 % (ref 0–1)
Eosinophils Absolute: 0 10*3/uL (ref 0.0–0.7)
Eosinophils Relative: 1 % (ref 0–5)
HCT: 29.5 % — ABNORMAL LOW (ref 36.0–46.0)
Hemoglobin: 9.1 g/dL — ABNORMAL LOW (ref 12.0–15.0)
Lymphocytes Relative: 16 % (ref 12–46)
Lymphs Abs: 1.1 10*3/uL (ref 0.7–4.0)
MCH: 29.9 pg (ref 26.0–34.0)
MCHC: 30.8 g/dL (ref 30.0–36.0)
MCV: 97 fL (ref 78.0–100.0)
Monocytes Absolute: 0.4 10*3/uL (ref 0.1–1.0)
Monocytes Relative: 6 % (ref 3–12)
NEUTROS ABS: 5.2 10*3/uL (ref 1.7–7.7)
Neutrophils Relative %: 77 % (ref 43–77)
Platelets: 229 10*3/uL (ref 150–400)
RBC: 3.04 MIL/uL — ABNORMAL LOW (ref 3.87–5.11)
RDW: 16.4 % — AB (ref 11.5–15.5)
WBC: 6.7 10*3/uL (ref 4.0–10.5)

## 2013-08-29 LAB — POCT I-STAT, CHEM 8
BUN: 22 mg/dL (ref 6–23)
Calcium, Ion: 1.16 mmol/L (ref 1.13–1.30)
Chloride: 102 mEq/L (ref 96–112)
Creatinine, Ser: 0.9 mg/dL (ref 0.50–1.10)
GLUCOSE: 91 mg/dL (ref 70–99)
HEMATOCRIT: 29 % — AB (ref 36.0–46.0)
HEMOGLOBIN: 9.9 g/dL — AB (ref 12.0–15.0)
Potassium: 4.1 mEq/L (ref 3.7–5.3)
Sodium: 143 mEq/L (ref 137–147)
TCO2: 22 mmol/L (ref 0–100)

## 2013-08-29 LAB — COMPREHENSIVE METABOLIC PANEL
ALK PHOS: 62 U/L (ref 39–117)
ALT: 28 U/L (ref 0–35)
AST: 23 U/L (ref 0–37)
Albumin: 3.5 g/dL (ref 3.5–5.2)
BILIRUBIN TOTAL: 0.4 mg/dL (ref 0.3–1.2)
BUN: 23 mg/dL (ref 6–23)
CHLORIDE: 103 meq/L (ref 96–112)
CO2: 24 mEq/L (ref 19–32)
Calcium: 9 mg/dL (ref 8.4–10.5)
Creatinine, Ser: 0.77 mg/dL (ref 0.50–1.10)
GFR calc Af Amer: >90 mL/min (ref >90)
GFR calc non Af Amer: 83 mL/min — ABNORMAL LOW (ref >90)
Glucose, Bld: 93 mg/dL (ref 70–99)
POTASSIUM: 4.2 meq/L (ref 3.7–5.3)
Sodium: 141 mEq/L (ref 137–147)
Total Protein: 6.7 g/dL (ref 6.0–8.3)

## 2013-08-29 LAB — RETICULOCYTES
RBC.: 3.04 MIL/uL — ABNORMAL LOW (ref 3.87–5.11)
Retic Count, Absolute: 212.8 10*3/uL — ABNORMAL HIGH (ref 19.0–186.0)
Retic Ct Pct: 7 % — ABNORMAL HIGH (ref 0.4–3.1)

## 2013-08-29 LAB — LACTATE DEHYDROGENASE: LDH: 399 U/L — ABNORMAL HIGH (ref 94–250)

## 2013-08-29 MED ORDER — DIPHENHYDRAMINE HCL 25 MG PO CAPS
ORAL_CAPSULE | ORAL | Status: AC
  Administered 2013-08-29: 25 mg via ORAL
  Filled 2013-08-29: qty 1

## 2013-08-29 MED ORDER — ACETAMINOPHEN 325 MG PO TABS: 650.0000 mg | ORAL_TABLET | ORAL | Status: DC | PRN

## 2013-08-29 MED ORDER — ACD FORMULA A 0.73-2.45-2.2 GM/100ML VI SOLN
Status: AC
  Administered 2013-08-29: 500 mL via INTRAVENOUS
  Filled 2013-08-29: qty 500

## 2013-08-29 MED ORDER — CALCIUM CARBONATE ANTACID 500 MG PO CHEW
2.0000 | CHEWABLE_TABLET | ORAL | Status: AC
  Administered 2013-08-29 (×2): 400 mg via ORAL

## 2013-08-29 MED ORDER — DIPHENHYDRAMINE HCL 25 MG PO CAPS
25.0000 mg | ORAL_CAPSULE | Freq: Four times a day (QID) | ORAL | Status: DC | PRN
  Administered 2013-08-29: 25 mg via ORAL

## 2013-08-29 MED ORDER — METHYLPREDNISOLONE SODIUM SUCC 125 MG IJ SOLR
INTRAMUSCULAR | Status: AC
  Administered 2013-08-29: 40 mg via INTRAVENOUS
  Filled 2013-08-29: qty 2

## 2013-08-29 MED ORDER — ANTICOAGULANT SODIUM CITRATE 4% (200MG/5ML) IV SOLN
5.0000 mL | Freq: Once | Status: AC
  Administered 2013-08-29: 5 mL
  Filled 2013-08-29 (×2): qty 250

## 2013-08-29 MED ORDER — CALCIUM CARBONATE ANTACID 500 MG PO CHEW
CHEWABLE_TABLET | ORAL | Status: AC
  Administered 2013-08-29: 400 mg via ORAL
  Filled 2013-08-29: qty 2

## 2013-08-29 MED ORDER — METHYLPREDNISOLONE SODIUM SUCC 125 MG IJ SOLR
40.0000 mg | Freq: Once | INTRAMUSCULAR | Status: AC
  Administered 2013-08-29: 40 mg via INTRAVENOUS

## 2013-08-29 MED ORDER — SODIUM CHLORIDE 0.9 % IV SOLN
4.0000 g | Freq: Once | INTRAVENOUS | Status: AC
  Administered 2013-08-29: 4 g via INTRAVENOUS
  Filled 2013-08-29: qty 40

## 2013-08-29 MED ORDER — ACD FORMULA A 0.73-2.45-2.2 GM/100ML VI SOLN
500.0000 mL | Status: DC
  Administered 2013-08-29: 500 mL via INTRAVENOUS

## 2013-08-30 ENCOUNTER — Telehealth: Payer: Self-pay | Admitting: *Deleted

## 2013-08-30 LAB — THERAPEUTIC PLASMA EXCHANGE (BLOOD BANK)
PLASMA VOLUME NEEDED: 2600
UNIT DIVISION: 0
UNIT DIVISION: 0
UNIT DIVISION: 0
Unit division: 0
Unit division: 0
Unit division: 0
Unit division: 0
Unit division: 0
Unit division: 0

## 2013-08-30 NOTE — Telephone Encounter (Signed)
Called pt - pt informed of platelet count 229,00 and K+ level of 4.2 per Dr Beryle Beams. Appt 5/4 @ 1330pm.

## 2013-08-30 NOTE — Telephone Encounter (Signed)
Message copied by Ebbie Latus on Fri Aug 30, 2013 11:35 AM ------      Message from: Annia Belt      Created: Fri Aug 30, 2013  8:46 AM       Call pt platlet count 229,000 & Potassium level good ------

## 2013-08-30 NOTE — Discharge Summary (Signed)
  Date: 08/30/2013  Patient name: [name]  Medical record number: [id number]  Date of birth: Dec 17, 1940   This patient has been seen and the plan of care was discussed with the house staff. Please see their note for complete details. I concur with their findings and plan.  Dominic Pea, DO, [facility] 08/30/2013, 10:00 AM

## 2013-09-02 ENCOUNTER — Encounter: Payer: Self-pay | Admitting: Oncology

## 2013-09-02 ENCOUNTER — Other Ambulatory Visit: Payer: Self-pay | Admitting: Oncology

## 2013-09-02 ENCOUNTER — Telehealth: Payer: Self-pay | Admitting: *Deleted

## 2013-09-02 ENCOUNTER — Ambulatory Visit (INDEPENDENT_AMBULATORY_CARE_PROVIDER_SITE_OTHER): Payer: Medicare Other | Admitting: Oncology

## 2013-09-02 DIAGNOSIS — M311 Thrombotic microangiopathy, unspecified: Secondary | ICD-10-CM

## 2013-09-02 DIAGNOSIS — M3119 Other thrombotic microangiopathy: Secondary | ICD-10-CM

## 2013-09-02 NOTE — Progress Notes (Signed)
Opened chart in error. Pt did not come to [facility] for appt. [name], 09/02/13, 2:58PM

## 2013-09-02 NOTE — Progress Notes (Signed)
Patient ID: [name], female   DOB: 10/18/1940, 73 y.o.   MRN: [id number] 73 year old woman under active treatment for third relapse of TTP. As of last week she achieved a complete response. I changed her plasma exchange program to every other day and she received a exchange last Sunday Tuesday and Thursday. I planned  to hold for 3 days and reevaluate today. She failed to report for the visit today. Nurse unable to contact her at home, or by cell phone, or on her daughter's phone number. My concern is that she may have gotten acutely ill and family took her to the closest hospital. We will continue to try to reach her. Close followup is critical at this time. She still has a vascular catheter in place.

## 2013-09-02 NOTE — Telephone Encounter (Signed)
Messages left on pt's home phone, Darlina Mccaughey mobile phone and Betsy Hollomon's mobile phone asking about pt's appt at 1:30 PM today, expressing Dr. Azucena Freed concern that pt has not yet arrived for 1:30 PM appt and asking for follow up re: patient condition and appt status. Yvonna Alanis, RN, 09/02/13 - 1: 40 - 1:50 PM

## 2013-09-03 ENCOUNTER — Encounter: Payer: Self-pay | Admitting: Oncology

## 2013-09-03 ENCOUNTER — Ambulatory Visit (INDEPENDENT_AMBULATORY_CARE_PROVIDER_SITE_OTHER): Payer: Medicare Other | Admitting: Oncology

## 2013-09-03 ENCOUNTER — Encounter (HOSPITAL_COMMUNITY)
Admission: RE | Admit: 2013-09-03 | Discharge: 2013-09-03 | Disposition: A | Discharge status: Home / Self Care | Payer: Medicare Other | Source: Ambulatory Visit | Attending: Oncology | Admitting: Oncology

## 2013-09-03 ENCOUNTER — Other Ambulatory Visit: Payer: Self-pay | Admitting: Oncology

## 2013-09-03 ENCOUNTER — Other Ambulatory Visit (HOSPITAL_COMMUNITY): Payer: Self-pay | Admitting: *Deleted

## 2013-09-03 ENCOUNTER — Other Ambulatory Visit: Payer: Medicare Other

## 2013-09-03 VITALS — BP 118/61 | HR 111 | Temp 97.8°F | Ht 63.5 in | Wt 170.4 lb

## 2013-09-03 DIAGNOSIS — M3119 Other thrombotic microangiopathy: Secondary | ICD-10-CM

## 2013-09-03 DIAGNOSIS — E876 Hypokalemia: Secondary | ICD-10-CM

## 2013-09-03 DIAGNOSIS — M311 Thrombotic microangiopathy, unspecified: Secondary | ICD-10-CM

## 2013-09-03 DIAGNOSIS — J988 Other specified respiratory disorders: Secondary | ICD-10-CM

## 2013-09-03 DIAGNOSIS — F341 Dysthymic disorder: Secondary | ICD-10-CM

## 2013-09-03 DIAGNOSIS — A419 Sepsis, unspecified organism: Secondary | ICD-10-CM

## 2013-09-03 DIAGNOSIS — F17200 Nicotine dependence, unspecified, uncomplicated: Secondary | ICD-10-CM

## 2013-09-03 LAB — CBC WITH DIFFERENTIAL/PLATELET
BASOS PCT: 0 % (ref 0–1)
Basophils Absolute: 0 10*3/uL (ref 0.0–0.1)
EOS ABS: 0 10*3/uL (ref 0.0–0.7)
Eosinophils Relative: 0 % (ref 0–5)
HCT: 32.1 % — ABNORMAL LOW (ref 36.0–46.0)
Hemoglobin: 9.7 g/dL — ABNORMAL LOW (ref 12.0–15.0)
Lymphocytes Relative: 8 % — ABNORMAL LOW (ref 12–46)
Lymphs Abs: 0.8 10*3/uL (ref 0.7–4.0)
MCH: 29.6 pg (ref 26.0–34.0)
MCHC: 30.2 g/dL (ref 30.0–36.0)
MCV: 97.9 fL (ref 78.0–100.0)
Monocytes Absolute: 0.4 10*3/uL (ref 0.1–1.0)
Monocytes Relative: 4 % (ref 3–12)
NEUTROS ABS: 8.7 10*3/uL — AB (ref 1.7–7.7)
NEUTROS PCT: 88 % — AB (ref 43–77)
Platelets: 257 10*3/uL (ref 150–400)
RBC: 3.28 MIL/uL — ABNORMAL LOW (ref 3.87–5.11)
RDW: 17 % — ABNORMAL HIGH (ref 11.5–15.5)
WBC: 9.9 10*3/uL (ref 4.0–10.5)

## 2013-09-03 LAB — COMPREHENSIVE METABOLIC PANEL
ALBUMIN: 3.6 g/dL (ref 3.5–5.2)
ALT: 24 U/L (ref 0–35)
AST: 19 U/L (ref 0–37)
Alkaline Phosphatase: 63 U/L (ref 39–117)
BILIRUBIN TOTAL: 0.3 mg/dL (ref 0.3–1.2)
BUN: 31 mg/dL — ABNORMAL HIGH (ref 6–23)
CO2: 26 meq/L (ref 19–32)
Calcium: 9.3 mg/dL (ref 8.4–10.5)
Chloride: 105 mEq/L (ref 96–112)
Creat: 0.96 mg/dL (ref 0.50–1.10)
GLUCOSE: 163 mg/dL — AB (ref 70–99)
POTASSIUM: 4.7 meq/L (ref 3.5–5.3)
Sodium: 143 mEq/L (ref 135–145)
Total Protein: 7 g/dL (ref 6.0–8.3)

## 2013-09-03 LAB — RETICULOCYTES
ABS Retic: 225.3 10*3/uL — ABNORMAL HIGH (ref 19.0–186.0)
RBC.: 3.28 MIL/uL — ABNORMAL LOW (ref 3.87–5.11)
RETIC CT PCT: 6.87 % — AB (ref 0.4–2.3)

## 2013-09-03 LAB — SAVE SMEAR

## 2013-09-03 LAB — LACTATE DEHYDROGENASE: LDH: 431 U/L — AB (ref 94–250)

## 2013-09-03 MED ORDER — HEPARIN SODIUM (PORCINE) 1000 UNIT/ML IJ SOLN
5000.0000 [IU] | Freq: Once | INTRAMUSCULAR | Status: DC
  Filled 2013-09-03: qty 5

## 2013-09-03 NOTE — Patient Instructions (Signed)
To lab today  Go to lunch then come back to clinic to decide whether we need to continue plasma exchange Return visit and lab 2:30 PM Tuesday 5/12

## 2013-09-03 NOTE — Progress Notes (Signed)
Patient ID: [name], female   DOB: [birth year], 73 y.o.   MRN: [id number] Hematology and Oncology Follow Up Visit  [name] [id number] [birth year] 73 y.o. 09/03/2013 5:57 PM   Principle Diagnosis: Encounter Diagnoses  Name Primary?  . TTP (thrombotic thrombocytopenic purpura) Yes  . Sepsis(995.91)   . Hypokalemia      Interim History:   Short interim followup visit for this 73 year old woman with third relapse of TTP. Please see multiple recent notes for full details. Early relapse suspected on 07/05/2013. Attempt to abort the process with steroids and Rituxan were unsuccessful and she was initially admitted to the hospital on March 13 to begin plasma exchange. She had an initial prompt response and treatment stopped after 8 exchanges. Unfortunately, one week off plasma exchange blood counts deteriorated  and plasma exchange was resumed. She had a prolonged course complicated by 2 hospital admissions:  the first for acute bronchitis and the second for culture-negative sepsis. During the episode of sepsis she became transiently hypotensive. Transient fluid overload/early congestive heart failure.  Her last plasma exchange was given Thursday, April 30. She appears to be in remission with platelet count now over 200,000 since April 28. Hemoglobin has stabilized. The only unexplained finding is persistent elevation of serum LDH with no gross evidence for ongoing hemolysis. Review of the peripheral blood film no longer shows schistocytes. There are some spherocytes. Direct and indirect Coombs' testing negative. Haptoglobin normal. I have started to taper her steroids currently down to 20 mg daily. She continues on Imuran 100 mg daily.  She has no new complaints today.  Medications: reviewed  Allergies:  Allergies  Allergen Reactions  . Cefuroxime Axetil Other (See Comments)    "jittery"  . Adhesive [Tape] Other (See Comments)    Blisters, band-aide brand   . Other Other (See  Comments)    Wool: Reaction is hives [employer] tape: Reaction is blistering   . Sulfa Antibiotics Hives  . Sulfa Drugs Cross Reactors Other (See Comments)    unknown    Review of Systems: Hematology:  See above ENT ROS: No sore throat Breast ROS:  Respiratory ROS: No dyspnea. Persistent, mild, smoker's cough Cardiovascular ROS:  No chest pain or palpitations Gastrointestinal ROS:  No abdominal pain  Genito-Urinary ROS: Not questioned Musculoskeletal ROS: No change in chronic back pain Neurological ROS: No headache or change in vision Dermatological ROS: No rash Remaining ROS negative:   Physical Exam: Blood pressure 118/61, pulse 111, temperature 97.8 F (36.6 C), temperature source Oral, height 5' 3.5" (1.613 m), weight 170 lb 6.4 oz (77.293 kg), SpO2 98.00%. Wt Readings from Last 3 Encounters:  09/03/13 170 lb 6.4 oz (77.293 kg)  08/29/13 169 lb 12.1 oz (77 kg)  08/27/13 171 lb 4.8 oz (77.7 kg)     General appearance: Cushingoid Caucasian woman HENNT: Pharynx no erythema, exudate, mass, or ulcer. No thyromegaly or thyroid nodules Lymph nodes: No cervical, supraclavicular, or axillary lymphadenopathy Breasts:  Lungs: Clear to auscultation, resonant to percussion throughout Heart: Regular rhythm, no murmur, no gallop, no rub, no click, no edema Abdomen: Soft, nontender, normal bowel sounds, no mass, no organomegaly Extremities: Trace edema, no calf tenderness Musculoskeletal: no joint deformities GU:  Vascular: Carotid pulses 2+, no bruits, vascular catheter left subclavian position nontender Neurologic: Alert, oriented, PERRLA, optic discs sharp and vessels normal, no hemorrhage or exudate, cranial nerves grossly normal, motor strength 5 over 5, reflexes 1+ symmetric, upper body coordination normal, gait normal, Skin:  No rash, resolving ecchymosis right foot  Lab Results: CBC W/Diff    Component Value Date/Time   WBC 9.9 09/03/2013 1133   WBC 10.6*  08/02/2013 1008   WBC 8.6 11/09/2007 1100   RBC 3.28* 09/03/2013 1133   RBC 3.28* 09/03/2013 1133   RBC 3.99 08/02/2013 1008   RBC 4.74 11/09/2007 1100   HGB 9.7* 09/03/2013 1133   HGB 12.6 08/02/2013 1008   HGB 14.4 11/09/2007 1100   HCT 32.1* 09/03/2013 1133   HCT 38.8 08/02/2013 1008   HCT 42.6 11/09/2007 1100   PLT 257 09/03/2013 1133   PLT 76* 08/02/2013 1008   PLT 233 11/09/2007 1100   MCV 97.9 09/03/2013 1133   MCV 97.2 08/02/2013 1008   MCV 90 11/09/2007 1100   MCH 29.6 09/03/2013 1133   MCH 31.6 08/02/2013 1008   MCH 30.3 11/09/2007 1100   MCHC 30.2 09/03/2013 1133   MCHC 32.5 08/02/2013 1008   MCHC 33.7 11/09/2007 1100   RDW 17.0* 09/03/2013 1133   RDW 14.8* 08/02/2013 1008   RDW 12.5 11/09/2007 1100   LYMPHSABS 0.8 09/03/2013 1133   LYMPHSABS 0.2* 08/02/2013 1008   LYMPHSABS 2.5 11/09/2007 1100   MONOABS 0.4 09/03/2013 1133   MONOABS 0.7 08/02/2013 1008   EOSABS 0.0 09/03/2013 1133   EOSABS 0.0 08/02/2013 1008   EOSABS 0.1 11/09/2007 1100   BASOSABS 0.0 09/03/2013 1133   BASOSABS 0.0 08/02/2013 1008   BASOSABS 0.1 11/09/2007 1100     Chemistry      Component Value Date/Time   NA 143 09/03/2013 1133   NA 140 07/26/2013 1020   K 4.7 09/03/2013 1133   K 4.6 07/26/2013 1020   CL 105 09/03/2013 1133   CL 107 05/22/2012 1023   CO2 26 09/03/2013 1133   CO2 22 07/26/2013 1020   BUN 31* 09/03/2013 1133   BUN 35.1* 07/26/2013 1020   CREATININE 0.96 09/03/2013 1133   CREATININE 0.90 08/29/2013 1423   CREATININE 0.9 07/26/2013 1020      Component Value Date/Time   CALCIUM 9.3 09/03/2013 1133   CALCIUM 8.7 07/26/2013 1020   ALKPHOS 63 09/03/2013 1133   ALKPHOS 67 07/26/2013 1020   AST 19 09/03/2013 1133   AST 15 07/26/2013 1020   ALT 24 09/03/2013 1133   ALT 27 07/26/2013 1020   BILITOT 0.3 09/03/2013 1133   BILITOT 0.40 07/26/2013 1020        Impression:  #1. Multiply relapsed TTP She appears to be back in a solid remission at this time. I will discontinue plasma exchange and make arrangements to have her catheter removed. I will continue  to monitor blood counts on a weekly basis. I will continue to slowly taper steroids.  I will continue Imuran 100 mg daily in attempt to consolidate her remission. She is already completed 4 weekly doses of Rituxan. I'll make arrangements to have her vascular catheter removed by vascular surgery  #2. Unexplained persistent elevation of serum LDH A review this again with the patient and her husband today. If LDH remains elevated I will get CT scans to rule out underlying lymphoma.  #3. Obstructive airway disease  #4. Tobacco addiction  #5. Recent episode culture negative sepsis requiring hospitalization  #6. Recent episode of acute bronchitis requiring hospitalization  #7. Chronic anxiety and depression controlled on medication    CC: Patient Care Team: Kirk Ruths, MD as PCP - General (Unknown Physician Specialty)   Annia Belt, MD 5/5/20155:57 PM

## 2013-09-06 ENCOUNTER — Other Ambulatory Visit: Payer: Self-pay

## 2013-09-10 ENCOUNTER — Other Ambulatory Visit (INDEPENDENT_AMBULATORY_CARE_PROVIDER_SITE_OTHER): Payer: Medicare Other

## 2013-09-10 ENCOUNTER — Other Ambulatory Visit: Payer: Self-pay | Admitting: Oncology

## 2013-09-10 ENCOUNTER — Telehealth: Payer: Self-pay | Admitting: *Deleted

## 2013-09-10 ENCOUNTER — Other Ambulatory Visit: Payer: Medicare Other

## 2013-09-10 ENCOUNTER — Ambulatory Visit (HOSPITAL_COMMUNITY)
Admission: RE | Admit: 2013-09-10 | Discharge: 2013-09-10 | Disposition: A | Discharge status: Home / Self Care | Payer: Medicare Other | Source: Ambulatory Visit | Attending: Vascular Surgery | Admitting: Vascular Surgery

## 2013-09-10 ENCOUNTER — Encounter: Payer: Self-pay | Admitting: Oncology

## 2013-09-10 ENCOUNTER — Ambulatory Visit (INDEPENDENT_AMBULATORY_CARE_PROVIDER_SITE_OTHER): Payer: Medicare Other | Admitting: Oncology

## 2013-09-10 VITALS — BP 136/75 | HR 99 | Temp 98.9°F | Ht 63.5 in | Wt 171.3 lb

## 2013-09-10 DIAGNOSIS — M479 Spondylosis, unspecified: Secondary | ICD-10-CM

## 2013-09-10 DIAGNOSIS — M311 Thrombotic microangiopathy, unspecified: Secondary | ICD-10-CM

## 2013-09-10 DIAGNOSIS — M3119 Other thrombotic microangiopathy: Secondary | ICD-10-CM

## 2013-09-10 DIAGNOSIS — D6949 Other primary thrombocytopenia: Secondary | ICD-10-CM

## 2013-09-10 DIAGNOSIS — F341 Dysthymic disorder: Secondary | ICD-10-CM

## 2013-09-10 DIAGNOSIS — R899 Unspecified abnormal finding in specimens from other organs, systems and tissues: Secondary | ICD-10-CM | POA: Insufficient documentation

## 2013-09-10 DIAGNOSIS — J988 Other specified respiratory disorders: Secondary | ICD-10-CM

## 2013-09-10 DIAGNOSIS — I359 Nonrheumatic aortic valve disorder, unspecified: Secondary | ICD-10-CM

## 2013-09-10 DIAGNOSIS — F17200 Nicotine dependence, unspecified, uncomplicated: Secondary | ICD-10-CM

## 2013-09-10 HISTORY — DX: Unspecified abnormal finding in specimens from other organs, systems and tissues: R89.9

## 2013-09-10 LAB — COMPREHENSIVE METABOLIC PANEL
ALT: 21 U/L (ref 0–35)
AST: 19 U/L (ref 0–37)
Albumin: 3.5 g/dL (ref 3.5–5.2)
Alkaline Phosphatase: 44 U/L (ref 39–117)
BILIRUBIN TOTAL: 0.3 mg/dL (ref 0.3–1.2)
BUN: 25 mg/dL — AB (ref 6–23)
CALCIUM: 9.6 mg/dL (ref 8.4–10.5)
CHLORIDE: 104 meq/L (ref 96–112)
CO2: 25 mEq/L (ref 19–32)
Creat: 1.13 mg/dL — ABNORMAL HIGH (ref 0.50–1.10)
GLUCOSE: 136 mg/dL — AB (ref 70–99)
Potassium: 4.2 mEq/L (ref 3.5–5.3)
Sodium: 144 mEq/L (ref 135–145)
Total Protein: 6.8 g/dL (ref 6.0–8.3)

## 2013-09-10 LAB — CBC WITH DIFFERENTIAL/PLATELET
BASOS PCT: 0 % (ref 0–1)
Basophils Absolute: 0 10*3/uL (ref 0.0–0.1)
EOS ABS: 0.1 10*3/uL (ref 0.0–0.7)
EOS PCT: 1 % (ref 0–5)
HEMATOCRIT: 34.4 % — AB (ref 36.0–46.0)
Hemoglobin: 10.6 g/dL — ABNORMAL LOW (ref 12.0–15.0)
Lymphocytes Relative: 9 % — ABNORMAL LOW (ref 12–46)
Lymphs Abs: 0.9 10*3/uL (ref 0.7–4.0)
MCH: 29.8 pg (ref 26.0–34.0)
MCHC: 30.8 g/dL (ref 30.0–36.0)
MCV: 96.6 fL (ref 78.0–100.0)
MONO ABS: 0.5 10*3/uL (ref 0.1–1.0)
Monocytes Relative: 5 % (ref 3–12)
Neutro Abs: 8.5 10*3/uL — ABNORMAL HIGH (ref 1.7–7.7)
Neutrophils Relative %: 85 % — ABNORMAL HIGH (ref 43–77)
Platelets: 191 10*3/uL (ref 150–400)
RBC: 3.56 MIL/uL — ABNORMAL LOW (ref 3.87–5.11)
RDW: 17.2 % — AB (ref 11.5–15.5)
WBC: 10 10*3/uL (ref 4.0–10.5)

## 2013-09-10 LAB — RETICULOCYTES
ABS RETIC: 200.4 10*3/uL — AB (ref 19.0–186.0)
RBC.: 3.56 MIL/uL — ABNORMAL LOW (ref 3.87–5.11)
Retic Ct Pct: 5.63 % — ABNORMAL HIGH (ref 0.4–2.3)

## 2013-09-10 LAB — LACTATE DEHYDROGENASE: LDH: 458 U/L — ABNORMAL HIGH (ref 94–250)

## 2013-09-10 NOTE — Progress Notes (Signed)
[facility] SHORT STAY H&P  CC: ESRD   HPI: [name] is a 73 y.o. female who has been on active treatment for her third relapse of TTP using plasma exchange/vascular catheter.  She comes in today for catheter removal.  Her platelets today are 191.  We called Dr. Annia Belt and after review of her lab work he wants the catheter removed.       Past Medical History  Diagnosis Date  . Septicemia 03/2011  . Normal echocardiogram 03/15/11  . History of TTP (thrombotic thrombocytopenic purpura)   . Acute delirium   . Bacteremia 03/2011  . COPD (chronic obstructive pulmonary disease)   . Blood transfusion   . Anemia   . Arthritis   . Chronic back pain   . Spinal stenosis, lumbar   . Blood dyscrasia   . Depression     "mild"  . Tobacco abuse 05/10/2013  . Oral thrush 08/08/2013  . Hypokalemia 08/08/2013    Steroid related  . 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 Bronchitis, chronic obstructive w acute bronchitis 08/12/2013    Family Hx No family history on file.  Social HX History  Substance Use Topics  . Smoking status: Current Every Day Smoker -- 2.50 packs/day for 55 years    Types: Cigarettes  . Smokeless tobacco: Never Used  . Alcohol Use: Yes     Comment: very seldom.    Allergies Allergies  Allergen Reactions  . Cefuroxime Axetil Other (See Comments)    "jittery"  . Adhesive [Tape] Other (See Comments)    Blisters, band-aide brand   . Other Other (See Comments)    Wool: Reaction is hives [employer] tape: Reaction is blistering   . Sulfa Antibiotics Hives  . Sulfa Drugs Cross Reactors Other (See Comments)    unknown    Medications Current Outpatient Prescriptions  Medication Sig Dispense Refill  . azaTHIOprine (IMURAN) 50 MG tablet Take 100 mg by mouth daily.      . cholecalciferol (VITAMIN D) 400 UNITS TABS Take 400 Units by mouth.      . cyanocobalamin 100 MCG tablet  Take 1,000 mcg by mouth daily.      . fluconazole (DIFLUCAN) 100 MG tablet Take 100 mg by mouth daily.      Marland Kitchen FLUoxetine (PROZAC) 20 MG capsule Take 20 mg by mouth daily.       . folic acid (FOLVITE) 1 MG tablet Take 1 mg by mouth daily.      Marland Kitchen gabapentin (NEURONTIN) 400 MG capsule Take 400 mg by mouth 3 (three) times daily.      . Multiple Vitamin (MULTIVITAMIN) tablet Take 1 tablet by mouth 2 (two) times daily.       . phenol-menthol (CEPASTAT) 14.5 MG lozenge Place 1 lozenge inside cheek as needed for sore throat.  100 tablet  0  . potassium chloride SA (K-DUR,KLOR-CON) 20 MEQ tablet Take 20 mEq by mouth daily.       . predniSONE (DELTASONE) 20 MG tablet Take 2 tablets (40 mg total) by mouth daily with breakfast. Only on days not receiving Plasma exchange.  20 tablet  0  . QUEtiapine (SEROQUEL) 25 MG tablet Take 25 mg by mouth at bedtime.      Marland Kitchen tiotropium (SPIRIVA) 18 MCG inhalation capsule Place 18 mcg into inhaler and inhale daily.         No current facility-administered medications  for this encounter.   Facility-Administered Medications Ordered in Other Encounters  Medication Dose Route Frequency Provider Last Rate Last Dose  . sodium chloride 0.9 % injection 10 mL  10 mL Intracatheter PRN Annia Belt, MD   10 mL at 07/19/13 1740    Labs COAG Lab Results  Component Value Date   INR 1.16 08/13/2013   INR 1.37 03/17/2011   INR 1.16 03/16/2011   PROTIME 12.5 09/12/2005   No results found for this basename: PTT    PHYSICAL EXAM  There were no vitals filed for this visit.  General:  WDWN in NAD HENT: WNL Eyes: Pupils equal Pulmonary: normal non-labored breathing  Cardiac: RRR, Skin: normal, no cyanosis, jaundice, pallor or bruising Vascular Exam/Pulses: 2+  pulses Extremities without ischemic changes Hand grip is 5/5 and sensation in digits is intact;   Impression: This is a 73 y.o. female who has a functioning HD access.  Plan: Removal of Left IJ Plasma  exchange catheter Ulyses Amor 09/10/2013 8:11 AM   [facility] Catheter Removal Procedure Note  Diagnosis: TTP completed her treatment for her third relapse of TTP. Initial diagnosis in January 2005 when she presented with neurologic signs and symptoms.     Plan:  Remove left diatek catheter  Consent signed:  yes Time out completed:  yes Coumadin:  no PT/INR (if applicable):   Other labs:   Procedure: 1.  Sterile prepping and draping over catheter area 2. 0 ml 2% lidocaine plain instilled at removal site. 3.  left catheter removed in its entirety with cuff in tact. 4.  Complications: none  5. Tip of catheter sent for culture:  no   Patient tolerated procedure well:  yes Pressure held, no bleeding noted, dressing applied Instructions given to the pt regarding wound care and bleeding.  Other:  Ulyses Amor 09/10/2013 8:11 AM

## 2013-09-10 NOTE — Progress Notes (Signed)
Patient ID: [name], female   DOB: 12/25/1940, 73 y.o.   MRN: [id number] Hematology and Oncology Follow Up Visit  [name] [id number] Sep 27, 1940 73 y.o. 09/10/2013 4:37 PM   Principle Diagnosis: Encounter Diagnoses  Name Primary?  . Abnormal laboratory test Yes  . TTP (thrombotic thrombocytopenic purpura)      Interim History:  Short interval followup visit for this 73 year old woman under active treatment for her third relapse of TTP. Please see multiple recent notes for details. Briefly, she relapsed in March of this year. She was started on the initial treatment with steroids and Rituxan but platelet count continued to fall. She began plasma exchange on March 13. Following 8 exchanges she achieved a remission. Exchange was stopped. Platelet count began to fall after one week. Plasma exchange was resumed. Additional Rituxan was given. Imuran 100 mg by mouth daily was added to her regimen in an attempt to consolidate her response. She received plasma exchange daily then intermittently for the next month. She had a slow response but eventually achieved a complete platelet response. Most recent exchange and given on April 23. Hemoglobin stabilized but LDH remains elevated. Tests to exclude hemolysis were normal with an increased haptoglobin and a negative direct and indirect Coombs test. Bilirubin has never been elevated. Reticulocyte count has been persistently elevated. She required 2 hospitalizations:  the first for acute bronchitis and the second for culture-negative sepsis.  Clinically her condition has stabilized. She has been off plasma exchange now for 2 weeks. I am tapering her steroids currently down to 20 mg of prednisone daily. No further infectious complications since discharge from the hospital on April 27. I had her vascular catheter removed today. Platelet count is 191,000. Hemoglobin 10.6. Bilirubin 0.3. Unfortunately there has been a progressive rise in her LDH  now  458.   Medications: reviewed  Allergies:  Allergies  Allergen Reactions  . Cefuroxime Axetil Other (See Comments)    "jittery"  . Adhesive [Tape] Other (See Comments)    Blisters, band-aide brand   . Other Other (See Comments)    Wool: Reaction is hives [employer] tape: Reaction is blistering   . Sulfa Antibiotics Hives  . Sulfa Drugs Cross Reactors Other (See Comments)    unknown    Review of Systems: Hematology:  No bleeding or bruising ENT ROS: No sore throat  Breast ROS:  Respiratory ROS: Mild chronic bronchitis from ongoing tobacco abuse Cardiovascular ROS:  No chest pain or palpitations Gastrointestinal ROS:  No abdominal pain or change in bowel habit  Genito-Urinary ROS: No urinary tract symptoms Musculoskeletal ROS: No change in chronic back pain no new areas of pain Neurological ROS: No headache or change in vision. Dermatological ROS: No rash or ecchymosis Remaining ROS negative:   Physical Exam: Blood pressure 136/75, pulse 99, temperature 98.9 F (37.2 C), temperature source Oral, height 5' 3.5" (1.613 m), weight 171 lb 4.8 oz (77.701 kg), SpO2 97.00%. Wt Readings from Last 3 Encounters:  09/10/13 171 lb 4.8 oz (77.701 kg)  09/10/13 175 lb (79.379 kg)  09/03/13 170 lb 6.4 oz (77.293 kg)     General appearance: Cushingoid Caucasian woman HENNT: Pharynx no erythema, exudate, mass, or ulcer. No thyromegaly or thyroid nodules Lymph nodes: No cervical, supraclavicular, or axillary lymphadenopathy Breasts:  Lungs: Clear to auscultation, resonant to percussion throughout Heart: Regular rhythm, new 3-1/5 diastolic murmur second right intercostal space , no gallop, no rub, no click, no edema Abdomen: Soft, nontender, normal bowel  sounds, no mass, no organomegaly Extremities: No edema, no calf tenderness Musculoskeletal: no joint deformities GU:  Vascular: Carotid pulses 2+, no bruits, distal pulses: Dorsalis pedis 1+  symmetric Neurologic: Alert, oriented, PERRLA, bilateral intraocular lens implants, cranial nerves grossly normal, motor strength 5 over 5, reflexes 1+ symmetric, upper body coordination normal, gait normal, Skin: No rash, resolving ecchymosis right foot following minor trauma a few weeks ago   Lab Results: CBC W/Diff    Component Value Date/Time   WBC 10.0 09/10/2013 1123   WBC 10.6* 08/02/2013 1008   WBC 8.6 11/09/2007 1100   RBC 3.56* 09/10/2013 1123   RBC 3.56* 09/10/2013 1123   RBC 3.99 08/02/2013 1008   RBC 4.74 11/09/2007 1100   HGB 10.6* 09/10/2013 1123   HGB 12.6 08/02/2013 1008   HGB 14.4 11/09/2007 1100   HCT 34.4* 09/10/2013 1123   HCT 38.8 08/02/2013 1008   HCT 42.6 11/09/2007 1100   PLT 191 09/10/2013 1123   PLT 76* 08/02/2013 1008   PLT 233 11/09/2007 1100   MCV 96.6 09/10/2013 1123   MCV 97.2 08/02/2013 1008   MCV 90 11/09/2007 1100   MCH 29.8 09/10/2013 1123   MCH 31.6 08/02/2013 1008   MCH 30.3 11/09/2007 1100   MCHC 30.8 09/10/2013 1123   MCHC 32.5 08/02/2013 1008   MCHC 33.7 11/09/2007 1100   RDW 17.2* 09/10/2013 1123   RDW 14.8* 08/02/2013 1008   RDW 12.5 11/09/2007 1100   LYMPHSABS 0.9 09/10/2013 1123   LYMPHSABS 0.2* 08/02/2013 1008   LYMPHSABS 2.5 11/09/2007 1100   MONOABS 0.5 09/10/2013 1123   MONOABS 0.7 08/02/2013 1008   EOSABS 0.1 09/10/2013 1123   EOSABS 0.0 08/02/2013 1008   EOSABS 0.1 11/09/2007 1100   BASOSABS 0.0 09/10/2013 1123   BASOSABS 0.0 08/02/2013 1008   BASOSABS 0.1 11/09/2007 1100     Chemistry      Component Value Date/Time   NA 144 09/10/2013 1123   NA 140 07/26/2013 1020   K 4.2 09/10/2013 1123   K 4.6 07/26/2013 1020   CL 104 09/10/2013 1123   CL 107 05/22/2012 1023   CO2 25 09/10/2013 1123   CO2 22 07/26/2013 1020   BUN 25* 09/10/2013 1123   BUN 35.1* 07/26/2013 1020   CREATININE 1.13* 09/10/2013 1123   CREATININE 0.90 08/29/2013 1423   CREATININE 0.9 07/26/2013 1020      Component Value Date/Time   CALCIUM 9.6 09/10/2013 1123   CALCIUM 8.7 07/26/2013 1020   ALKPHOS 44  09/10/2013 1123   ALKPHOS 67 07/26/2013 1020   AST 19 09/10/2013 1123   AST 15 07/26/2013 1020   ALT 21 09/10/2013 1123   ALT 27 07/26/2013 1020   BILITOT 0.3 09/10/2013 1123   BILITOT 0.40 07/26/2013 1020         Impression:  #1. TTP now in fourth remission I will continue to taper her steroids on an alternate day schedule she is advised to take 30 mg alternating with 10 mg for one week, 20 mg alternating with 10 mg for one week, 10 mg daily for one week, 10 mg every other day for one week, and then stop. Continue Imuran 100 mg daily.  #2. Persistent and progressive rise of serum LDH in the absence of any obvious organ infarction or active hemolysis. This is extremely bothersome to me. I strongly feel that we need to exclude underlying lymphoma as the etiology of the elevated LDH. I'm going to get a CT scan of  the chest abdomen and pelvis. I'm sure I'll have to fight with the insurance company to get this authorized.  #3. New diastolic murmur in the aortic position. This is worrisome. She has a history of enterococcus endocarditis. I don't think that her elevated LDH represents hemolysis of her heart valve since I am not seeing schistocytes on the peripheral blood film  Followup echocardiogram. Done during recent hospitalization on April 25th, did not show any gross valvular abnormalities and specifically the aortic valve appeared normal   #4. Obstructive airway disease  #5. Ongoing tobacco addiction  #6. Chronic anxiety and depression  #7. Degenerative arthritis of the spine   CC: Patient Care Team: Kirk Ruths, MD as PCP - General (Unknown Physician Specialty)   Annia Belt, MD 5/12/20154:37 PM

## 2013-09-10 NOTE — Telephone Encounter (Signed)
Call from pt's husband - platelet count 191; wants to know if catheter can be pulled today. Also talked to [facility] from Vascular. Talked w/Dr Beryle Beams - ok to pull catheter. Maureen informed.

## 2013-09-10 NOTE — Progress Notes (Signed)
Left upper chest dressing clean, dry, intact. No subcutaneous air noted.

## 2013-09-10 NOTE — Patient Instructions (Signed)
Come in for lab every Tuesday We will try to get a CT scan approved and call you  Follow up visit with Dr Darnell Level 6/23 @ 11:15  Taper prednisone as follows:  Break 20 mg tabs in half Take 3 tabs = 30 mg alternate days with 1 tab = 10 mg for 1 week Then 2 tabs = 20 mg alternate with 1 tab = 10 mg for 1 week Then 1 tab = 10 mg for 1 week  Then 1 tab every other day for 1 week then stop  Stay on Imuran at current dose

## 2013-09-17 ENCOUNTER — Other Ambulatory Visit (INDEPENDENT_AMBULATORY_CARE_PROVIDER_SITE_OTHER): Payer: Medicare Other

## 2013-09-17 DIAGNOSIS — R899 Unspecified abnormal finding in specimens from other organs, systems and tissues: Secondary | ICD-10-CM

## 2013-09-17 DIAGNOSIS — R6889 Other general symptoms and signs: Secondary | ICD-10-CM

## 2013-09-17 DIAGNOSIS — M3119 Other thrombotic microangiopathy: Secondary | ICD-10-CM

## 2013-09-17 DIAGNOSIS — M311 Thrombotic microangiopathy, unspecified: Secondary | ICD-10-CM

## 2013-09-17 LAB — CBC WITH DIFFERENTIAL/PLATELET
Basophils Absolute: 0 10*3/uL (ref 0.0–0.1)
Basophils Relative: 0 % (ref 0–1)
EOS PCT: 1 % (ref 0–5)
Eosinophils Absolute: 0.1 10*3/uL (ref 0.0–0.7)
HEMATOCRIT: 34.7 % — AB (ref 36.0–46.0)
HEMOGLOBIN: 11.1 g/dL — AB (ref 12.0–15.0)
LYMPHS PCT: 14 % (ref 12–46)
Lymphs Abs: 1.3 10*3/uL (ref 0.7–4.0)
MCH: 29.2 pg (ref 26.0–34.0)
MCHC: 32 g/dL (ref 30.0–36.0)
MCV: 91.3 fL (ref 78.0–100.0)
MONO ABS: 0.5 10*3/uL (ref 0.1–1.0)
MONOS PCT: 5 % (ref 3–12)
Neutro Abs: 7.4 10*3/uL (ref 1.7–7.7)
Neutrophils Relative %: 80 % — ABNORMAL HIGH (ref 43–77)
Platelets: 198 10*3/uL (ref 150–400)
RBC: 3.8 MIL/uL — AB (ref 3.87–5.11)
RDW: 17.1 % — ABNORMAL HIGH (ref 11.5–15.5)
WBC: 9.2 10*3/uL (ref 4.0–10.5)

## 2013-09-17 LAB — COMPREHENSIVE METABOLIC PANEL
ALT: 17 U/L (ref 0–35)
AST: 19 U/L (ref 0–37)
Albumin: 4 g/dL (ref 3.5–5.2)
Alkaline Phosphatase: 44 U/L (ref 39–117)
BUN: 19 mg/dL (ref 6–23)
CO2: 29 meq/L (ref 19–32)
Calcium: 9.9 mg/dL (ref 8.4–10.5)
Chloride: 103 mEq/L (ref 96–112)
Creat: 1.1 mg/dL (ref 0.50–1.10)
Glucose, Bld: 106 mg/dL — ABNORMAL HIGH (ref 70–99)
Potassium: 4.5 mEq/L (ref 3.5–5.3)
Sodium: 140 mEq/L (ref 135–145)
Total Bilirubin: 0.4 mg/dL (ref 0.2–1.2)
Total Protein: 6.8 g/dL (ref 6.0–8.3)

## 2013-09-17 LAB — RETICULOCYTES
ABS RETIC: 159.6 10*3/uL (ref 19.0–186.0)
RBC.: 3.8 MIL/uL — ABNORMAL LOW (ref 3.87–5.11)
Retic Ct Pct: 4.2 % — ABNORMAL HIGH (ref 0.4–2.3)

## 2013-09-17 LAB — LACTATE DEHYDROGENASE: LDH: 349 U/L — ABNORMAL HIGH (ref 94–250)

## 2013-09-18 ENCOUNTER — Telehealth: Payer: Self-pay | Admitting: *Deleted

## 2013-09-18 NOTE — Telephone Encounter (Signed)
Called pt - pt informed platelet count stable @ 198,00 and LDH improving @ 349 per Dr Beryle Beams.

## 2013-09-18 NOTE — Telephone Encounter (Signed)
Message copied by Ebbie Latus on Wed Sep 18, 2013 11:45 AM ------      Message from: Annia Belt      Created: Wed Sep 18, 2013  6:49 AM       Call pt: platelets stable at 198,000.  LDH test improving ------

## 2013-09-24 ENCOUNTER — Other Ambulatory Visit (INDEPENDENT_AMBULATORY_CARE_PROVIDER_SITE_OTHER): Payer: Medicare Other

## 2013-09-24 DIAGNOSIS — R899 Unspecified abnormal finding in specimens from other organs, systems and tissues: Secondary | ICD-10-CM

## 2013-09-24 DIAGNOSIS — R6889 Other general symptoms and signs: Secondary | ICD-10-CM

## 2013-09-24 DIAGNOSIS — M311 Thrombotic microangiopathy, unspecified: Secondary | ICD-10-CM

## 2013-09-24 DIAGNOSIS — M3119 Other thrombotic microangiopathy: Secondary | ICD-10-CM

## 2013-09-25 LAB — COMPREHENSIVE METABOLIC PANEL
ALK PHOS: 38 U/L — AB (ref 39–117)
ALT: 13 U/L (ref 0–35)
AST: 19 U/L (ref 0–37)
Albumin: 4 g/dL (ref 3.5–5.2)
BILIRUBIN TOTAL: 0.3 mg/dL (ref 0.2–1.2)
BUN: 21 mg/dL (ref 6–23)
CO2: 27 mEq/L (ref 19–32)
CREATININE: 0.98 mg/dL (ref 0.50–1.10)
Calcium: 9.4 mg/dL (ref 8.4–10.5)
Chloride: 104 mEq/L (ref 96–112)
Glucose, Bld: 90 mg/dL (ref 70–99)
Potassium: 4.2 mEq/L (ref 3.5–5.3)
Sodium: 141 mEq/L (ref 135–145)
Total Protein: 6.6 g/dL (ref 6.0–8.3)

## 2013-09-25 LAB — CBC WITH DIFFERENTIAL/PLATELET
BASOS ABS: 0.1 10*3/uL (ref 0.0–0.1)
BASOS PCT: 1 % (ref 0–1)
EOS PCT: 2 % (ref 0–5)
Eosinophils Absolute: 0.1 10*3/uL (ref 0.0–0.7)
HEMATOCRIT: 39 % (ref 36.0–46.0)
Hemoglobin: 12 g/dL (ref 12.0–15.0)
Lymphocytes Relative: 26 % (ref 12–46)
Lymphs Abs: 1.9 10*3/uL (ref 0.7–4.0)
MCH: 28.7 pg (ref 26.0–34.0)
MCHC: 30.8 g/dL (ref 30.0–36.0)
MCV: 93.3 fL (ref 78.0–100.0)
Monocytes Absolute: 0.6 10*3/uL (ref 0.1–1.0)
Monocytes Relative: 8 % (ref 3–12)
NEUTROS ABS: 4.5 10*3/uL (ref 1.7–7.7)
Neutrophils Relative %: 63 % (ref 43–77)
Platelets: 223 10*3/uL (ref 150–400)
RBC: 4.18 MIL/uL (ref 3.87–5.11)
RDW: 17.3 % — ABNORMAL HIGH (ref 11.5–15.5)
WBC: 7.2 10*3/uL (ref 4.0–10.5)

## 2013-09-25 LAB — LACTATE DEHYDROGENASE: LDH: 285 U/L — ABNORMAL HIGH (ref 94–250)

## 2013-09-25 LAB — RETICULOCYTES
ABS RETIC: 129.6 10*3/uL (ref 19.0–186.0)
RBC.: 4.18 MIL/uL (ref 3.87–5.11)
Retic Ct Pct: 3.1 % — ABNORMAL HIGH (ref 0.4–2.3)

## 2013-09-26 ENCOUNTER — Telehealth: Payer: Self-pay | Admitting: *Deleted

## 2013-09-26 NOTE — Telephone Encounter (Signed)
Called pt - pt informed of 223,000 platelet count. Also LDH level continues to improve and CT scans canceled per Dr Beryle Beams.

## 2013-09-26 NOTE — Telephone Encounter (Signed)
Message copied by Ebbie Latus on Thu Sep 26, 2013 10:26 AM ------      Message from: Annia Belt      Created: Wed Sep 25, 2013  2:55 PM       Call pt: platelets remain normal at 223,000; LDH test continues to improve so we don't need to do the CT scansc ------

## 2013-10-01 ENCOUNTER — Ambulatory Visit (HOSPITAL_COMMUNITY): Payer: Medicare Other

## 2013-10-01 ENCOUNTER — Other Ambulatory Visit: Payer: Medicare Other

## 2013-10-01 ENCOUNTER — Other Ambulatory Visit (INDEPENDENT_AMBULATORY_CARE_PROVIDER_SITE_OTHER): Payer: Medicare Other

## 2013-10-01 DIAGNOSIS — R899 Unspecified abnormal finding in specimens from other organs, systems and tissues: Secondary | ICD-10-CM

## 2013-10-01 DIAGNOSIS — M311 Thrombotic microangiopathy, unspecified: Secondary | ICD-10-CM

## 2013-10-01 DIAGNOSIS — M3119 Other thrombotic microangiopathy: Secondary | ICD-10-CM

## 2013-10-01 DIAGNOSIS — R6889 Other general symptoms and signs: Secondary | ICD-10-CM

## 2013-10-01 LAB — CBC WITH DIFFERENTIAL/PLATELET
BASOS PCT: 1 % (ref 0–1)
Basophils Absolute: 0 10*3/uL (ref 0.0–0.1)
Eosinophils Absolute: 0.1 10*3/uL (ref 0.0–0.7)
Eosinophils Relative: 3 % (ref 0–5)
HCT: 37.9 % (ref 36.0–46.0)
HEMOGLOBIN: 12.2 g/dL (ref 12.0–15.0)
LYMPHS ABS: 1.6 10*3/uL (ref 0.7–4.0)
Lymphocytes Relative: 33 % (ref 12–46)
MCH: 30.3 pg (ref 26.0–34.0)
MCHC: 32.2 g/dL (ref 30.0–36.0)
MCV: 94 fL (ref 78.0–100.0)
MONOS PCT: 15 % — AB (ref 3–12)
Monocytes Absolute: 0.7 10*3/uL (ref 0.1–1.0)
NEUTROS ABS: 2.4 10*3/uL (ref 1.7–7.7)
NEUTROS PCT: 48 % (ref 43–77)
Platelets: 206 10*3/uL (ref 150–400)
RBC: 4.03 MIL/uL (ref 3.87–5.11)
RDW: 16.2 % — ABNORMAL HIGH (ref 11.5–15.5)
WBC: 4.9 10*3/uL (ref 4.0–10.5)

## 2013-10-01 LAB — COMPREHENSIVE METABOLIC PANEL
ALK PHOS: 45 U/L (ref 39–117)
ALT: 13 U/L (ref 0–35)
AST: 19 U/L (ref 0–37)
Albumin: 3.7 g/dL (ref 3.5–5.2)
BUN: 18 mg/dL (ref 6–23)
CO2: 26 meq/L (ref 19–32)
Calcium: 9.8 mg/dL (ref 8.4–10.5)
Chloride: 104 mEq/L (ref 96–112)
Creat: 0.95 mg/dL (ref 0.50–1.10)
Glucose, Bld: 91 mg/dL (ref 70–99)
POTASSIUM: 4.4 meq/L (ref 3.5–5.3)
SODIUM: 142 meq/L (ref 135–145)
TOTAL PROTEIN: 6.7 g/dL (ref 6.0–8.3)
Total Bilirubin: 0.2 mg/dL — ABNORMAL LOW (ref 0.3–1.2)

## 2013-10-01 LAB — RETICULOCYTES
ABS Retic: 111.6 10*3/uL (ref 19.0–186.0)
RBC.: 4.03 MIL/uL (ref 3.87–5.11)
RETIC CT PCT: 2.77 % — AB (ref 0.4–2.3)

## 2013-10-01 LAB — LACTATE DEHYDROGENASE: LDH: 311 U/L — ABNORMAL HIGH (ref 94–250)

## 2013-10-02 ENCOUNTER — Telehealth: Payer: Self-pay | Admitting: *Deleted

## 2013-10-02 NOTE — Telephone Encounter (Signed)
Called pt x 2 - no answer.  Left message of tests result; platelet count 206,000 which remains within normal range and LDH of 311 which is slight higher but Dr Beryle Beams will watch. And to call [facility] if she has any questions.

## 2013-10-02 NOTE — Telephone Encounter (Signed)
Message copied by Ebbie Latus on Wed Oct 02, 2013  1:23 PM ------      Message from: [name]      Created: Tue Oct 01, 2013  3:52 PM       Call pt: platelet count remains normal @ 206,000;  LDH slightly higher than last week but OK to watch for now ------

## 2013-10-08 ENCOUNTER — Other Ambulatory Visit (INDEPENDENT_AMBULATORY_CARE_PROVIDER_SITE_OTHER): Payer: Medicare Other

## 2013-10-08 DIAGNOSIS — M311 Thrombotic microangiopathy, unspecified: Secondary | ICD-10-CM

## 2013-10-08 DIAGNOSIS — R6889 Other general symptoms and signs: Secondary | ICD-10-CM

## 2013-10-08 DIAGNOSIS — M3119 Other thrombotic microangiopathy: Secondary | ICD-10-CM

## 2013-10-08 DIAGNOSIS — R899 Unspecified abnormal finding in specimens from other organs, systems and tissues: Secondary | ICD-10-CM

## 2013-10-08 LAB — COMPREHENSIVE METABOLIC PANEL
ALT: 10 U/L (ref 0–35)
AST: 19 U/L (ref 0–37)
Albumin: 3.6 g/dL (ref 3.5–5.2)
Alkaline Phosphatase: 46 U/L (ref 39–117)
BILIRUBIN TOTAL: 0.2 mg/dL — AB (ref 0.2–1.2)
BUN: 19 mg/dL (ref 6–23)
CALCIUM: 9.6 mg/dL (ref 8.4–10.5)
CHLORIDE: 102 meq/L (ref 96–112)
CO2: 27 meq/L (ref 19–32)
CREATININE: 1.08 mg/dL (ref 0.50–1.10)
GLUCOSE: 98 mg/dL (ref 70–99)
Potassium: 4.4 mEq/L (ref 3.5–5.3)
Sodium: 141 mEq/L (ref 135–145)
Total Protein: 6.7 g/dL (ref 6.0–8.3)

## 2013-10-08 LAB — RETICULOCYTES
ABS RETIC: 105.1 10*3/uL (ref 19.0–186.0)
RBC.: 4.29 MIL/uL (ref 3.87–5.11)
RETIC CT PCT: 2.45 % — AB (ref 0.4–2.3)

## 2013-10-08 LAB — CBC WITH DIFFERENTIAL/PLATELET
Basophils Absolute: 0.1 10*3/uL (ref 0.0–0.1)
Basophils Relative: 1 % (ref 0–1)
EOS ABS: 0.2 10*3/uL (ref 0.0–0.7)
EOS PCT: 4 % (ref 0–5)
HEMATOCRIT: 39.9 % (ref 36.0–46.0)
Hemoglobin: 12.6 g/dL (ref 12.0–15.0)
LYMPHS ABS: 1.4 10*3/uL (ref 0.7–4.0)
LYMPHS PCT: 27 % (ref 12–46)
MCH: 29.4 pg (ref 26.0–34.0)
MCHC: 31.6 g/dL (ref 30.0–36.0)
MCV: 93 fL (ref 78.0–100.0)
MONO ABS: 0.6 10*3/uL (ref 0.1–1.0)
MONOS PCT: 11 % (ref 3–12)
Neutro Abs: 3 10*3/uL (ref 1.7–7.7)
Neutrophils Relative %: 57 % (ref 43–77)
Platelets: 217 10*3/uL (ref 150–400)
RBC: 4.29 MIL/uL (ref 3.87–5.11)
RDW: 15.7 % — ABNORMAL HIGH (ref 11.5–15.5)
WBC: 5.2 10*3/uL (ref 4.0–10.5)

## 2013-10-08 LAB — LACTATE DEHYDROGENASE: LDH: 282 U/L — AB (ref 94–250)

## 2013-10-15 ENCOUNTER — Other Ambulatory Visit (INDEPENDENT_AMBULATORY_CARE_PROVIDER_SITE_OTHER): Payer: Medicare Other

## 2013-10-15 ENCOUNTER — Telehealth: Payer: Self-pay | Admitting: *Deleted

## 2013-10-15 DIAGNOSIS — M311 Thrombotic microangiopathy, unspecified: Secondary | ICD-10-CM

## 2013-10-15 DIAGNOSIS — R6889 Other general symptoms and signs: Secondary | ICD-10-CM

## 2013-10-15 DIAGNOSIS — R899 Unspecified abnormal finding in specimens from other organs, systems and tissues: Secondary | ICD-10-CM

## 2013-10-15 DIAGNOSIS — M3119 Other thrombotic microangiopathy: Secondary | ICD-10-CM

## 2013-10-15 NOTE — Telephone Encounter (Signed)
Pt states her Azathioprine cost has increased 4 times because there is only one manufacturer. She is asking whether there is any other medicine she could take that might not be so expensive ? This RN asked pt to discuss this concern with Dr. Beryle Beams at her next visit which is Tues, 10/22/13 but she requested the question be forwarded to Dr. Beryle Beams today in case he could do some research prior to the visit to determine if any substitute existed. This message routed to  Dr. Beryle Beams. Yvonna Alanis, RN, 10/15/13, 10:15 AM

## 2013-10-16 ENCOUNTER — Telehealth: Payer: Self-pay | Admitting: *Deleted

## 2013-10-16 LAB — COMPREHENSIVE METABOLIC PANEL
ALK PHOS: 38 U/L — AB (ref 39–117)
ALT: 10 U/L (ref 0–35)
AST: 19 U/L (ref 0–37)
Albumin: 3.7 g/dL (ref 3.5–5.2)
BUN: 14 mg/dL (ref 6–23)
CHLORIDE: 109 meq/L (ref 96–112)
CO2: 25 mEq/L (ref 19–32)
CREATININE: 1.06 mg/dL (ref 0.50–1.10)
Calcium: 9.5 mg/dL (ref 8.4–10.5)
Glucose, Bld: 99 mg/dL (ref 70–99)
POTASSIUM: 4.5 meq/L (ref 3.5–5.3)
Sodium: 141 mEq/L (ref 135–145)
Total Bilirubin: 0.3 mg/dL (ref 0.2–1.2)
Total Protein: 6.3 g/dL (ref 6.0–8.3)

## 2013-10-16 LAB — CBC WITH DIFFERENTIAL/PLATELET
BASOS ABS: 0 10*3/uL (ref 0.0–0.1)
BASOS PCT: 1 % (ref 0–1)
EOS PCT: 4 % (ref 0–5)
Eosinophils Absolute: 0.2 10*3/uL (ref 0.0–0.7)
HEMATOCRIT: 40.3 % (ref 36.0–46.0)
Hemoglobin: 12.7 g/dL (ref 12.0–15.0)
LYMPHS PCT: 39 % (ref 12–46)
Lymphs Abs: 1.8 10*3/uL (ref 0.7–4.0)
MCH: 28.3 pg (ref 26.0–34.0)
MCHC: 31.5 g/dL (ref 30.0–36.0)
MCV: 90 fL (ref 78.0–100.0)
MONO ABS: 0.5 10*3/uL (ref 0.1–1.0)
Monocytes Relative: 12 % (ref 3–12)
NEUTROS ABS: 2 10*3/uL (ref 1.7–7.7)
Neutrophils Relative %: 44 % (ref 43–77)
PLATELETS: 226 10*3/uL (ref 150–400)
RBC: 4.48 MIL/uL (ref 3.87–5.11)
RDW: 16.3 % — ABNORMAL HIGH (ref 11.5–15.5)
WBC: 4.5 10*3/uL (ref 4.0–10.5)

## 2013-10-16 LAB — RETICULOCYTES
ABS RETIC: 80.6 10*3/uL (ref 19.0–186.0)
RBC.: 4.48 MIL/uL (ref 3.87–5.11)
Retic Ct Pct: 1.8 % (ref 0.4–2.3)

## 2013-10-16 LAB — LACTATE DEHYDROGENASE: LDH: 242 U/L (ref 94–250)

## 2013-10-16 NOTE — Telephone Encounter (Signed)
Pt contacted per documented message and verbalizes understanding of her lab results and medication follow up Leroy Sea, 10/16/13, 12:47 PM

## 2013-10-16 NOTE — Telephone Encounter (Signed)
Cancer center pharmacist suggests trying [facility] outpatient pharmacy for her Imuran. Maybe you can find the number for her. Thanks

## 2013-10-16 NOTE — Telephone Encounter (Signed)
Message left to call RN to deliver message from Dr. Beryle Beams: "Platelets great at 226,000. LDH test now normal. Message in to pharmacist to see what we can do about her Imuran". Yvonna Alanis, RN, 10/16/13, 10 AM

## 2013-10-17 ENCOUNTER — Telehealth: Payer: Self-pay | Admitting: Pharmacist

## 2013-10-17 NOTE — Telephone Encounter (Signed)
[facility] outpatient pharmacy and was informed that the pharmacy is now open to patients (previously for employees only).  Called the patient and provide the pharmacy phone number [phone]. Was unable to reach the patient so left a message to call back.

## 2013-10-17 NOTE — Telephone Encounter (Signed)
Made 2 attempts to contact the patient

## 2013-10-22 ENCOUNTER — Ambulatory Visit (INDEPENDENT_AMBULATORY_CARE_PROVIDER_SITE_OTHER): Payer: Medicare Other | Admitting: Oncology

## 2013-10-22 ENCOUNTER — Encounter: Payer: Self-pay | Admitting: Oncology

## 2013-10-22 VITALS — BP 131/69 | HR 101 | Temp 98.7°F | Resp 24 | Ht 64.0 in | Wt 180.3 lb

## 2013-10-22 DIAGNOSIS — F17200 Nicotine dependence, unspecified, uncomplicated: Secondary | ICD-10-CM

## 2013-10-22 DIAGNOSIS — J449 Chronic obstructive pulmonary disease, unspecified: Secondary | ICD-10-CM

## 2013-10-22 DIAGNOSIS — M311 Thrombotic microangiopathy, unspecified: Secondary | ICD-10-CM

## 2013-10-22 DIAGNOSIS — M479 Spondylosis, unspecified: Secondary | ICD-10-CM

## 2013-10-22 DIAGNOSIS — M3119 Other thrombotic microangiopathy: Secondary | ICD-10-CM

## 2013-10-22 LAB — CBC WITH DIFFERENTIAL/PLATELET
Basophils Absolute: 0.1 10*3/uL (ref 0.0–0.1)
Basophils Relative: 1 % (ref 0–1)
Eosinophils Absolute: 0.2 10*3/uL (ref 0.0–0.7)
Eosinophils Relative: 3 % (ref 0–5)
HCT: 40 % (ref 36.0–46.0)
Hemoglobin: 13.2 g/dL (ref 12.0–15.0)
LYMPHS ABS: 1.9 10*3/uL (ref 0.7–4.0)
LYMPHS PCT: 35 % (ref 12–46)
MCH: 28.8 pg (ref 26.0–34.0)
MCHC: 33 g/dL (ref 30.0–36.0)
MCV: 87.3 fL (ref 78.0–100.0)
MONO ABS: 0.6 10*3/uL (ref 0.1–1.0)
Monocytes Relative: 12 % (ref 3–12)
NEUTROS ABS: 2.6 10*3/uL (ref 1.7–7.7)
NEUTROS PCT: 49 % (ref 43–77)
Platelets: 200 10*3/uL (ref 150–400)
RBC: 4.58 MIL/uL (ref 3.87–5.11)
RDW: 16.3 % — ABNORMAL HIGH (ref 11.5–15.5)
WBC: 5.4 10*3/uL (ref 4.0–10.5)

## 2013-10-22 LAB — LACTATE DEHYDROGENASE: LDH: 242 U/L (ref 94–250)

## 2013-10-22 LAB — COMPREHENSIVE METABOLIC PANEL
ALBUMIN: 3.9 g/dL (ref 3.5–5.2)
ALK PHOS: 42 U/L (ref 39–117)
ALT: 8 U/L (ref 0–35)
AST: 18 U/L (ref 0–37)
BUN: 16 mg/dL (ref 6–23)
CO2: 28 mEq/L (ref 19–32)
Calcium: 9.8 mg/dL (ref 8.4–10.5)
Chloride: 105 mEq/L (ref 96–112)
Creat: 1.09 mg/dL (ref 0.50–1.10)
Glucose, Bld: 126 mg/dL — ABNORMAL HIGH (ref 70–99)
POTASSIUM: 4 meq/L (ref 3.5–5.3)
SODIUM: 142 meq/L (ref 135–145)
Total Bilirubin: 0.3 mg/dL (ref 0.2–1.2)
Total Protein: 6.3 g/dL (ref 6.0–8.3)

## 2013-10-22 LAB — RETICULOCYTES
ABS RETIC: 87 10*3/uL (ref 19.0–186.0)
RBC.: 4.58 MIL/uL (ref 3.87–5.11)
RETIC CT PCT: 1.9 % (ref 0.4–2.3)

## 2013-10-22 NOTE — Patient Instructions (Signed)
Lab today then Change lab to every 2 weeks Continue Imuran 100 mg daily MD visit 9/22

## 2013-10-22 NOTE — Progress Notes (Signed)
Patient ID: [name], female   DOB: 22-Aug-1940, 73 y.o.   MRN: [id number] Hematology and Oncology Follow Up Visit  [name] [id number] 06-Feb-1941 73 y.o. 10/22/2013 11:56 AM   Principle Diagnosis: Encounter Diagnosis  Name Primary?  . TTP (thrombotic thrombocytopenic purpura) Yes     Interim History:   Short interval followup visit for this 73 year old woman with multiply relapsed TTP. Initial diagnosis January 2005. First relapse August 2006. Second relapse October 2011. Most recent relapse February 2015. She was able to achieve remission in each instance with plasma exchange and steroids. She received Rituxan anti-B cell antibody for the second and subsequent relapses. I elected to put her on chronic immunosuppressive therapy with Imuran 100 mg daily following most recent relapse. She has now been off plasma exchange since April 30. She has been tapered off all steroids. Blood counts are holding with most recent platelet count 242,000 recorded on June 16. She had a unexplained persistent elevation of serum LDH which is finally improving with value of 242 on June 16 lap normal up to 250. Hemoglobin has been stable currently 12.7 g. Her vascular catheter has been removed. She has had no interim fever or infection. Overall her course over the years has been punctuated by serious infections. Pneumonitis and culture-negative sepsis with  recent relapse requiring hospitalization in April, 2015. 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  She has no new complaints today. She is finally starting to work on decreasing her cigarette consumption. Confirmed by her daughter who accompanies her. She has a chronic smoker's cough. Dyspnea with exertion.  Medications: reviewed  Allergies:  Allergies  Allergen Reactions  . Cefuroxime Axetil Other (See Comments)    "jittery"  . Adhesive [Tape] Other (See Comments)    Blisters,  band-aide brand   . Other Other (See Comments)    Wool: Reaction is hives [employer] tape: Reaction is blistering   . Sulfa Antibiotics Hives  . Sulfa Drugs Cross Reactors Other (See Comments)    unknown    Review of Systems: Hematology:  No bleeding or bruising ENT ROS: No sore throat  Breast ROS:  Respiratory ROS: See above Cardiovascular ROS: No ischemic type chest pain  Gastrointestinal ROS: No abdominal pain or change in bowel habit   Genito-Urinary ROS: Not questioned  Musculoskeletal ROS: Chronic back pain from arthritis Neurological ROS: No headache or change in vision Dermatological ROS: No rash or ecchymosis Remaining ROS negative:   Physical Exam: Blood pressure 131/69, pulse 101, temperature 98.7 F (37.1 C), temperature source Oral, resp. rate 24, height 5\' 4"  (1.626 m), weight 180 lb 4.8 oz (81.784 kg), SpO2 95.00%. Wt Readings from Last 3 Encounters:  10/22/13 180 lb 4.8 oz (81.784 kg)  09/10/13 171 lb 4.8 oz (77.701 kg)  09/10/13 175 lb (79.379 kg)     General appearance: Well-nourished Caucasian woman HENNT: Pharynx no erythema, exudate, mass, or ulcer. No thyromegaly or thyroid nodules Lymph nodes: No cervical, supraclavicular, or axillary lymphadenopathy Breasts:  Lungs: Clear to auscultation, resonant to percussion throughout Heart: Regular rhythm, no murmur, no gallop, no rub, no click, no edema Abdomen: Soft, nontender, normal bowel sounds, no mass, no organomegaly Extremities: No edema, no calf tenderness Musculoskeletal: no joint deformities GU:  Vascular: Carotid pulses 2+, no bruits, distal pulses: Dorsalis pedis 1+ symmetric Neurologic: Alert, oriented, PERRL, cranial nerves grossly normal, motor strength 5 over 5, reflexes 1+ symmetric, upper body coordination normal, gait normal, Skin:  No rash or ecchymosis  Lab Results: CBC W/Diff    Component Value Date/Time   WBC 4.5 10/15/2013 1007   WBC 10.6* 08/02/2013 1008   WBC  8.6 11/09/2007 1100   RBC 4.48 10/15/2013 1007   RBC 4.48 10/15/2013 1007   RBC 3.99 08/02/2013 1008   RBC 4.74 11/09/2007 1100   HGB 12.7 10/15/2013 1007   HGB 12.6 08/02/2013 1008   HGB 14.4 11/09/2007 1100   HCT 40.3 10/15/2013 1007   HCT 38.8 08/02/2013 1008   HCT 42.6 11/09/2007 1100   PLT 226 10/15/2013 1007   PLT 76* 08/02/2013 1008   PLT 233 11/09/2007 1100   MCV 90.0 10/15/2013 1007   MCV 97.2 08/02/2013 1008   MCV 90 11/09/2007 1100   MCH 28.3 10/15/2013 1007   MCH 31.6 08/02/2013 1008   MCH 30.3 11/09/2007 1100   MCHC 31.5 10/15/2013 1007   MCHC 32.5 08/02/2013 1008   MCHC 33.7 11/09/2007 1100   RDW 16.3* 10/15/2013 1007   RDW 14.8* 08/02/2013 1008   RDW 12.5 11/09/2007 1100   LYMPHSABS 1.8 10/15/2013 1007   LYMPHSABS 0.2* 08/02/2013 1008   LYMPHSABS 2.5 11/09/2007 1100   MONOABS 0.5 10/15/2013 1007   MONOABS 0.7 08/02/2013 1008   EOSABS 0.2 10/15/2013 1007   EOSABS 0.0 08/02/2013 1008   EOSABS 0.1 11/09/2007 1100   BASOSABS 0.0 10/15/2013 1007   BASOSABS 0.0 08/02/2013 1008   BASOSABS 0.1 11/09/2007 1100     Chemistry      Component Value Date/Time   NA 141 10/15/2013 1007   NA 140 07/26/2013 1020   K 4.5 10/15/2013 1007   K 4.6 07/26/2013 1020   CL 109 10/15/2013 1007   CL 107 05/22/2012 1023   CO2 25 10/15/2013 1007   CO2 22 07/26/2013 1020   BUN 14 10/15/2013 1007   BUN 35.1* 07/26/2013 1020   CREATININE 1.06 10/15/2013 1007   CREATININE 0.90 08/29/2013 1423   CREATININE 0.9 07/26/2013 1020      Component Value Date/Time   CALCIUM 9.5 10/15/2013 1007   CALCIUM 8.7 07/26/2013 1020   ALKPHOS 38* 10/15/2013 1007   ALKPHOS 67 07/26/2013 1020   AST 19 10/15/2013 1007   AST 15 07/26/2013 1020   ALT 10 10/15/2013 1007   ALT 27 07/26/2013 1020   BILITOT 0.3 10/15/2013 1007   BILITOT 0.40 07/26/2013 1020       Impression:  #1. 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 Currently back in remission following third relapse. I will continue the Imuran 100 mg daily indefinitely. Decrease frequency  of CBC with reticulocyte count to every 2 weeks, chem profile with LDH monthly.  #2. Obstructive airway disease  #3. Tobacco addiction  #4. Degenerative arthritis of the spine.  #5. 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  #6. Culture negative sepsis presenting with pneumonitis and cardiac decompensation April 2015   CC: Patient Care Team: Kirk Ruths, MD as PCP - General (Unknown Physician Specialty)   Annia Belt, MD 6/23/201511:56 AM

## 2013-10-23 ENCOUNTER — Telehealth: Payer: Self-pay | Admitting: *Deleted

## 2013-10-23 NOTE — Telephone Encounter (Signed)
Message copied by Ebbie Latus on Wed Oct 23, 2013  5:11 PM ------      Message from: [name]      Created: Wed Oct 23, 2013  9:03 AM       Call pt: lab good platelets 200,000; LDH in normal range ------

## 2013-10-23 NOTE — Telephone Encounter (Signed)
Called pt - informed pt Plt ct 200,000 and LDH within nl range per Dr Beryle Beams. Pt stated "Great".

## 2013-11-05 ENCOUNTER — Other Ambulatory Visit: Payer: Medicare Other

## 2013-11-05 ENCOUNTER — Other Ambulatory Visit (INDEPENDENT_AMBULATORY_CARE_PROVIDER_SITE_OTHER): Payer: Medicare Other

## 2013-11-05 DIAGNOSIS — M3119 Other thrombotic microangiopathy: Secondary | ICD-10-CM

## 2013-11-05 DIAGNOSIS — M311 Thrombotic microangiopathy, unspecified: Secondary | ICD-10-CM

## 2013-11-05 LAB — CBC WITH DIFFERENTIAL/PLATELET
BASOS PCT: 1 % (ref 0–1)
Basophils Absolute: 0 10*3/uL (ref 0.0–0.1)
EOS ABS: 0.3 10*3/uL (ref 0.0–0.7)
EOS PCT: 7 % — AB (ref 0–5)
HCT: 42.5 % (ref 36.0–46.0)
Hemoglobin: 14.2 g/dL (ref 12.0–15.0)
LYMPHS ABS: 1.8 10*3/uL (ref 0.7–4.0)
Lymphocytes Relative: 42 % (ref 12–46)
MCH: 29 pg (ref 26.0–34.0)
MCHC: 33.4 g/dL (ref 30.0–36.0)
MCV: 86.7 fL (ref 78.0–100.0)
Monocytes Absolute: 0.4 10*3/uL (ref 0.1–1.0)
Monocytes Relative: 10 % (ref 3–12)
Neutro Abs: 1.8 10*3/uL (ref 1.7–7.7)
Neutrophils Relative %: 40 % — ABNORMAL LOW (ref 43–77)
PLATELETS: 197 10*3/uL (ref 150–400)
RBC: 4.9 MIL/uL (ref 3.87–5.11)
RDW: 16.1 % — ABNORMAL HIGH (ref 11.5–15.5)
WBC: 4.4 10*3/uL (ref 4.0–10.5)

## 2013-11-05 LAB — RETICULOCYTES
ABS Retic: 73.5 10*3/uL (ref 19.0–186.0)
RBC.: 4.9 MIL/uL (ref 3.87–5.11)
Retic Ct Pct: 1.5 % (ref 0.4–2.3)

## 2013-11-06 ENCOUNTER — Telehealth: Payer: Self-pay | Admitting: *Deleted

## 2013-11-06 NOTE — Telephone Encounter (Signed)
Called pt  spokened to pt's husband - informed him pt's platelet count 197,00 per Dr Beryle Beams, he will tell pt. No questions.

## 2013-11-06 NOTE — Telephone Encounter (Signed)
Message copied by Ebbie Latus on Wed Nov 06, 2013  1:12 PM ------      Message from: Annia Belt      Created: Tue Nov 05, 2013  7:36 PM       Call pt: platlets 197,000 ------

## 2013-11-19 ENCOUNTER — Other Ambulatory Visit (INDEPENDENT_AMBULATORY_CARE_PROVIDER_SITE_OTHER): Payer: Medicare Other

## 2013-11-19 DIAGNOSIS — M3119 Other thrombotic microangiopathy: Secondary | ICD-10-CM

## 2013-11-19 DIAGNOSIS — M311 Thrombotic microangiopathy, unspecified: Secondary | ICD-10-CM

## 2013-11-19 LAB — CBC WITH DIFFERENTIAL/PLATELET
Basophils Absolute: 0 10*3/uL (ref 0.0–0.1)
Basophils Relative: 1 % (ref 0–1)
Eosinophils Absolute: 0.2 10*3/uL (ref 0.0–0.7)
Eosinophils Relative: 5 % (ref 0–5)
HEMATOCRIT: 43.3 % (ref 36.0–46.0)
Hemoglobin: 14.5 g/dL (ref 12.0–15.0)
LYMPHS ABS: 1.8 10*3/uL (ref 0.7–4.0)
LYMPHS PCT: 40 % (ref 12–46)
MCH: 29.1 pg (ref 26.0–34.0)
MCHC: 33.5 g/dL (ref 30.0–36.0)
MCV: 86.9 fL (ref 78.0–100.0)
Monocytes Absolute: 0.5 10*3/uL (ref 0.1–1.0)
Monocytes Relative: 12 % (ref 3–12)
Neutro Abs: 1.9 10*3/uL (ref 1.7–7.7)
Neutrophils Relative %: 42 % — ABNORMAL LOW (ref 43–77)
Platelets: 177 10*3/uL (ref 150–400)
RBC: 4.98 MIL/uL (ref 3.87–5.11)
RDW: 15.9 % — ABNORMAL HIGH (ref 11.5–15.5)
WBC: 4.5 10*3/uL (ref 4.0–10.5)

## 2013-11-19 LAB — COMPREHENSIVE METABOLIC PANEL
ALT: 8 U/L (ref 0–35)
AST: 16 U/L (ref 0–37)
Albumin: 3.8 g/dL (ref 3.5–5.2)
Alkaline Phosphatase: 47 U/L (ref 39–117)
BUN: 19 mg/dL (ref 6–23)
CO2: 26 meq/L (ref 19–32)
Calcium: 9.4 mg/dL (ref 8.4–10.5)
Chloride: 106 mEq/L (ref 96–112)
Creat: 1.05 mg/dL (ref 0.50–1.10)
Glucose, Bld: 92 mg/dL (ref 70–99)
POTASSIUM: 4.6 meq/L (ref 3.5–5.3)
Sodium: 140 mEq/L (ref 135–145)
TOTAL PROTEIN: 6.4 g/dL (ref 6.0–8.3)
Total Bilirubin: 0.3 mg/dL (ref 0.2–1.2)

## 2013-11-19 LAB — RETICULOCYTES
ABS Retic: 69.7 10*3/uL (ref 19.0–186.0)
RBC.: 4.98 MIL/uL (ref 3.87–5.11)
Retic Ct Pct: 1.4 % (ref 0.4–2.3)

## 2013-11-19 LAB — LACTATE DEHYDROGENASE: LDH: 181 U/L (ref 94–250)

## 2013-11-20 ENCOUNTER — Telehealth: Payer: Self-pay | Admitting: *Deleted

## 2013-11-20 ENCOUNTER — Encounter: Payer: Self-pay | Admitting: Oncology

## 2013-11-20 NOTE — Telephone Encounter (Signed)
Called pt - pt sleeping; talked to pt's husband. Informed plt count 177,000 - concern it has dropped 20 points. Also informed LDH level nl @ 181. And to continue Imuran despite her hair loss- voiced understanding. Stated pt is starting to feel slightly weak; wants to know if she needs to schedule an appt. Last appt was 6/23.

## 2013-11-20 NOTE — Telephone Encounter (Signed)
Message copied by Ebbie Latus on Wed Nov 20, 2013  3:09 PM ------      Message from: [name]      Created: Wed Nov 20, 2013 12:03 PM       Call pt: lab good platelets 177,000.  LDH now normal.  I want her to stay on the Imuran despite hair loss. ------

## 2013-11-20 NOTE — Telephone Encounter (Signed)
Call pt: random minor change in platelet count not significant.  Hemoglobin normal.  No nee to schedule appt just because she is weak unless she starts to run a fever or get new symptoms like a cough.

## 2013-11-22 NOTE — Telephone Encounter (Signed)
Pt called several times, no answer - left message if pt develop a fever ,cough or any other symptoms to call [facility] and schedule an appt.

## 2013-11-25 ENCOUNTER — Encounter: Payer: Self-pay | Admitting: Oncology

## 2013-12-03 ENCOUNTER — Other Ambulatory Visit (INDEPENDENT_AMBULATORY_CARE_PROVIDER_SITE_OTHER): Payer: Medicare Other

## 2013-12-03 DIAGNOSIS — M3119 Other thrombotic microangiopathy: Secondary | ICD-10-CM

## 2013-12-03 DIAGNOSIS — M311 Thrombotic microangiopathy, unspecified: Secondary | ICD-10-CM

## 2013-12-03 LAB — CBC WITH DIFFERENTIAL/PLATELET
Basophils Absolute: 0 10*3/uL (ref 0.0–0.1)
Basophils Relative: 1 % (ref 0–1)
EOS ABS: 0.2 10*3/uL (ref 0.0–0.7)
Eosinophils Relative: 5 % (ref 0–5)
HCT: 43.9 % (ref 36.0–46.0)
HEMOGLOBIN: 15.1 g/dL — AB (ref 12.0–15.0)
LYMPHS ABS: 1.8 10*3/uL (ref 0.7–4.0)
LYMPHS PCT: 40 % (ref 12–46)
MCH: 30 pg (ref 26.0–34.0)
MCHC: 34.4 g/dL (ref 30.0–36.0)
MCV: 87.1 fL (ref 78.0–100.0)
MONOS PCT: 10 % (ref 3–12)
Monocytes Absolute: 0.5 10*3/uL (ref 0.1–1.0)
Neutro Abs: 2 10*3/uL (ref 1.7–7.7)
Neutrophils Relative %: 44 % (ref 43–77)
PLATELETS: 152 10*3/uL (ref 150–400)
RBC: 5.04 MIL/uL (ref 3.87–5.11)
RDW: 16 % — ABNORMAL HIGH (ref 11.5–15.5)
WBC: 4.5 10*3/uL (ref 4.0–10.5)

## 2013-12-03 LAB — COMPREHENSIVE METABOLIC PANEL
ALBUMIN: 3.9 g/dL (ref 3.5–5.2)
ALK PHOS: 43 U/L (ref 39–117)
ALT: 9 U/L (ref 0–35)
AST: 18 U/L (ref 0–37)
BILIRUBIN TOTAL: 0.3 mg/dL (ref 0.2–1.2)
BUN: 22 mg/dL (ref 6–23)
CO2: 26 mEq/L (ref 19–32)
Calcium: 9.2 mg/dL (ref 8.4–10.5)
Chloride: 104 mEq/L (ref 96–112)
Creat: 1.06 mg/dL (ref 0.50–1.10)
Glucose, Bld: 83 mg/dL (ref 70–99)
POTASSIUM: 4.2 meq/L (ref 3.5–5.3)
SODIUM: 139 meq/L (ref 135–145)
TOTAL PROTEIN: 6.4 g/dL (ref 6.0–8.3)

## 2013-12-03 LAB — RETICULOCYTES
ABS RETIC: 70.6 10*3/uL (ref 19.0–186.0)
RBC.: 5.04 MIL/uL (ref 3.87–5.11)
Retic Ct Pct: 1.4 % (ref 0.4–2.3)

## 2013-12-03 LAB — LACTATE DEHYDROGENASE: LDH: 185 U/L (ref 94–250)

## 2013-12-04 ENCOUNTER — Telehealth: Payer: Self-pay | Admitting: *Deleted

## 2013-12-04 NOTE — Telephone Encounter (Signed)
Pt's daughter answered the phone and has been approved by pt to take messages about lab results so dtr given the results per Dr. Beryle Beams that "platelets 152,000 but hemoglobin better than ever at 15 and LDH normal so I am not concerned. Continue Q 2 week lab checks." - Pt's dtr acknowledged lab result message and stated next labs are already scheduled for next 2 wk appt. Yvonna Alanis, RN, 12/04/13, 6:26 PM

## 2013-12-17 ENCOUNTER — Other Ambulatory Visit (INDEPENDENT_AMBULATORY_CARE_PROVIDER_SITE_OTHER): Payer: Medicare Other

## 2013-12-17 DIAGNOSIS — M311 Thrombotic microangiopathy, unspecified: Secondary | ICD-10-CM

## 2013-12-17 DIAGNOSIS — M3119 Other thrombotic microangiopathy: Secondary | ICD-10-CM

## 2013-12-17 LAB — CBC WITH DIFFERENTIAL/PLATELET
Basophils Absolute: 0 K/uL (ref 0.0–0.1)
Basophils Relative: 1 % (ref 0–1)
Eosinophils Absolute: 0.2 K/uL (ref 0.0–0.7)
Eosinophils Relative: 5 % (ref 0–5)
HCT: 45.4 % (ref 36.0–46.0)
Hemoglobin: 15.4 g/dL — ABNORMAL HIGH (ref 12.0–15.0)
Lymphocytes Relative: 32 % (ref 12–46)
Lymphs Abs: 1.5 K/uL (ref 0.7–4.0)
MCH: 29.4 pg (ref 26.0–34.0)
MCHC: 33.9 g/dL (ref 30.0–36.0)
MCV: 86.8 fL (ref 78.0–100.0)
Monocytes Absolute: 0.4 K/uL (ref 0.1–1.0)
Monocytes Relative: 9 % (ref 3–12)
Neutro Abs: 2.5 K/uL (ref 1.7–7.7)
Neutrophils Relative %: 53 % (ref 43–77)
Platelets: 159 K/uL (ref 150–400)
RBC: 5.23 MIL/uL — ABNORMAL HIGH (ref 3.87–5.11)
RDW: 16.1 % — ABNORMAL HIGH (ref 11.5–15.5)
WBC: 4.7 K/uL (ref 4.0–10.5)

## 2013-12-17 LAB — RETICULOCYTES
ABS RETIC: 57.5 10*3/uL (ref 19.0–186.0)
RBC.: 5.23 MIL/uL — ABNORMAL HIGH (ref 3.87–5.11)
RETIC CT PCT: 1.1 % (ref 0.4–2.3)

## 2013-12-19 ENCOUNTER — Telehealth: Payer: Self-pay | Admitting: *Deleted

## 2013-12-19 NOTE — Telephone Encounter (Signed)
Pt called/informed platelet count stable at 159,000 and Hgb of 15.4 - pt replied "Great'. But not too happy w/her hair falling out and "going bald'.

## 2013-12-19 NOTE — Telephone Encounter (Signed)
Message copied by Ebbie Latus on Thu Dec 19, 2013 11:56 AM ------      Message from: Annia Belt      Created: Wed Dec 18, 2013  7:49 AM       Call pt platelets stable at 159,000. Hemoglobin 15 ------

## 2013-12-31 ENCOUNTER — Other Ambulatory Visit (INDEPENDENT_AMBULATORY_CARE_PROVIDER_SITE_OTHER): Payer: Medicare Other

## 2013-12-31 DIAGNOSIS — M311 Thrombotic microangiopathy, unspecified: Secondary | ICD-10-CM

## 2013-12-31 DIAGNOSIS — M3119 Other thrombotic microangiopathy: Secondary | ICD-10-CM

## 2014-01-01 LAB — COMPREHENSIVE METABOLIC PANEL
ALT: 12 U/L (ref 0–35)
AST: 21 U/L (ref 0–37)
Albumin: 4.1 g/dL (ref 3.5–5.2)
Alkaline Phosphatase: 49 U/L (ref 39–117)
BILIRUBIN TOTAL: 0.5 mg/dL (ref 0.2–1.2)
BUN: 16 mg/dL (ref 6–23)
CO2: 28 mEq/L (ref 19–32)
CREATININE: 1.09 mg/dL (ref 0.50–1.10)
Calcium: 10.1 mg/dL (ref 8.4–10.5)
Chloride: 105 mEq/L (ref 96–112)
GLUCOSE: 94 mg/dL (ref 70–99)
Potassium: 3.9 mEq/L (ref 3.5–5.3)
Sodium: 141 mEq/L (ref 135–145)
Total Protein: 6.7 g/dL (ref 6.0–8.3)

## 2014-01-01 LAB — CBC WITH DIFFERENTIAL/PLATELET
BASOS ABS: 0 10*3/uL (ref 0.0–0.1)
Basophils Relative: 1 % (ref 0–1)
EOS ABS: 0.2 10*3/uL (ref 0.0–0.7)
EOS PCT: 6 % — AB (ref 0–5)
HCT: 46.2 % — ABNORMAL HIGH (ref 36.0–46.0)
Hemoglobin: 16 g/dL — ABNORMAL HIGH (ref 12.0–15.0)
LYMPHS PCT: 38 % (ref 12–46)
Lymphs Abs: 1.5 10*3/uL (ref 0.7–4.0)
MCH: 29.6 pg (ref 26.0–34.0)
MCHC: 34.6 g/dL (ref 30.0–36.0)
MCV: 85.4 fL (ref 78.0–100.0)
Monocytes Absolute: 0.4 10*3/uL (ref 0.1–1.0)
Monocytes Relative: 9 % (ref 3–12)
Neutro Abs: 1.8 10*3/uL (ref 1.7–7.7)
Neutrophils Relative %: 46 % (ref 43–77)
Platelets: 170 10*3/uL (ref 150–400)
RBC: 5.41 MIL/uL — ABNORMAL HIGH (ref 3.87–5.11)
RDW: 16.4 % — ABNORMAL HIGH (ref 11.5–15.5)
WBC: 4 10*3/uL (ref 4.0–10.5)

## 2014-01-01 LAB — RETICULOCYTES
ABS RETIC: 43.3 10*3/uL (ref 19.0–186.0)
RBC.: 5.41 MIL/uL — ABNORMAL HIGH (ref 3.87–5.11)
Retic Ct Pct: 0.8 % (ref 0.4–2.3)

## 2014-01-01 LAB — LACTATE DEHYDROGENASE: LDH: 209 U/L (ref 94–250)

## 2014-01-03 ENCOUNTER — Telehealth: Payer: Self-pay | Admitting: *Deleted

## 2014-01-03 NOTE — Telephone Encounter (Signed)
Message copied by Ebbie Latus on Fri Jan 03, 2014  1:42 PM ------      Message from: [name]      Created: Thu Jan 02, 2014  2:11 AM       Call pt counts good.  Is she still smoking? ------

## 2014-01-03 NOTE — Telephone Encounter (Addendum)
Pt called w/her results -  count is good per Dr Beryle Beams.States she is still smoking 1.5 ppd.

## 2014-01-14 ENCOUNTER — Encounter: Payer: Self-pay | Admitting: Oncology

## 2014-01-14 ENCOUNTER — Ambulatory Visit (INDEPENDENT_AMBULATORY_CARE_PROVIDER_SITE_OTHER): Payer: Medicare Other | Admitting: Oncology

## 2014-01-14 ENCOUNTER — Other Ambulatory Visit (INDEPENDENT_AMBULATORY_CARE_PROVIDER_SITE_OTHER): Payer: Medicare Other

## 2014-01-14 VITALS — BP 117/50 | HR 91 | Temp 98.4°F | Wt 169.6 lb

## 2014-01-14 DIAGNOSIS — J988 Other specified respiratory disorders: Secondary | ICD-10-CM

## 2014-01-14 DIAGNOSIS — R899 Unspecified abnormal finding in specimens from other organs, systems and tissues: Secondary | ICD-10-CM

## 2014-01-14 DIAGNOSIS — M311 Thrombotic microangiopathy, unspecified: Secondary | ICD-10-CM

## 2014-01-14 DIAGNOSIS — M3119 Other thrombotic microangiopathy: Secondary | ICD-10-CM

## 2014-01-14 DIAGNOSIS — F17200 Nicotine dependence, unspecified, uncomplicated: Secondary | ICD-10-CM

## 2014-01-14 DIAGNOSIS — M479 Spondylosis, unspecified: Secondary | ICD-10-CM

## 2014-01-14 LAB — RETICULOCYTES
ABS RETIC: 53.7 10*3/uL (ref 19.0–186.0)
RBC.: 5.11 MIL/uL (ref 3.87–5.11)
Retic Ct Pct: 1.05 % (ref 0.4–2.3)

## 2014-01-14 LAB — CBC WITH DIFFERENTIAL/PLATELET
Basophils Absolute: 0 10*3/uL (ref 0.0–0.1)
Basophils Relative: 1 % (ref 0–1)
Eosinophils Absolute: 0.2 10*3/uL (ref 0.0–0.7)
Eosinophils Relative: 5 % (ref 0–5)
HEMATOCRIT: 46.1 % — AB (ref 36.0–46.0)
HEMOGLOBIN: 15.9 g/dL — AB (ref 12.0–15.0)
LYMPHS PCT: 38 % (ref 12–46)
Lymphs Abs: 1.9 10*3/uL (ref 0.7–4.0)
MCH: 31.1 pg (ref 26.0–34.0)
MCHC: 34.5 g/dL (ref 30.0–36.0)
MCV: 90.2 fL (ref 78.0–100.0)
MONOS PCT: 10 % (ref 3–12)
Monocytes Absolute: 0.5 10*3/uL (ref 0.1–1.0)
NEUTROS ABS: 2.3 10*3/uL (ref 1.7–7.7)
Neutrophils Relative %: 46 % (ref 43–77)
Platelets: 143 10*3/uL — ABNORMAL LOW (ref 150–400)
RBC: 5.11 MIL/uL (ref 3.87–5.11)
RDW: 14.8 % (ref 11.5–15.5)
WBC: 4.9 10*3/uL (ref 4.0–10.5)

## 2014-01-14 MED ORDER — AZATHIOPRINE 50 MG PO TABS: 100.0000 mg | ORAL_TABLET | Freq: Every day | ORAL | Status: DC

## 2014-01-14 NOTE — Patient Instructions (Signed)
Continue weekly CBC Continue Imuran (Azathioprine) MD visit 10/27 @ 10:45 AM

## 2014-01-15 NOTE — Progress Notes (Signed)
Patient ID: [name], female   DOB: June 30, 1940, 73 y.o.   MRN: [id number] Hematology and Oncology Follow Up Visit  [name] [id number] 09-29-1940 73 y.o. 01/15/2014 6:48 PM   Principle Diagnosis: Encounter Diagnosis  Name Primary?  . TTP (thrombotic thrombocytopenic purpura) Yes     Interim History:   Followup visit for this 73 year old lady with multiply relapsed TTP. Initial diagnosis January 2005. In the center remission with plasma exchange and steroids. First relapse August 2006. Second remission with plasma exchange and steroids then Rituxan consolidation. Second relapse October 2011 treated with plasma exchange, steroids, and Rituxan. Third relapse March 2015. Treated with plasma exchange, steroids, Rituxan, and currently on maintenance immunosuppression with azathioprine. Last plasma exchange given on 08/29/2013. She had a persistent unexplained elevation of her LDH through June 2015 which eventually came down to normal with most recent value of 209 on September 1  Course to date has been complicated by intermittent severe infections. Epidural abscess November 2012 requiring emergency decompressive laminectomy at L1-L4 with blood cultures growing enterococcus with subsequent infection of her heart valve.. Culture-negative sepsis and acute bronchitis with probable viral upper respiratory tract infection requiring admission in April 2015 with second admission in April 2015 with recurrent high fever, culture-negative sepsis, and early congestive heart failure complicated by underlying advanced but not oxygen dependent obstructive airway disease and ongoing tobacco addiction.  She's now been off plasma exchange for 5 months. 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 Reticulocyte count today 1%. Bilirubin has never been elevated even when she was hemolyzing. Since coming off plasma exchange, platelet counts have  been consistently above 150,000 until today when platelets are 143,000 compared with 170,000 on September 1 at 159,000 on August 18.  Her only complaint today is that her hair is thinning out on the azathioprine. I'm tired of telling her to stop smoking.      Medications: reviewed  Allergies:  Allergies  Allergen Reactions  . Cefuroxime Axetil Other (See Comments)    "jittery"  . Adhesive [Tape] Other (See Comments)    Blisters, band-aide brand   . Other Other (See Comments)    Wool: Reaction is hives [employer] tape: Reaction is blistering   . Sulfa Antibiotics Hives  . Sulfa Drugs Cross Reactors Other (See Comments)    unknown    Review of Systems: Hematology: No bleeding or bruising  ENT ROS: No sore throat Breast ROS:  Respiratory ROS: No cough or dyspnea at rest Cardiovascular ROS:  No ischemic chest pain or palpitations Gastrointestinal ROS:   No abdominal pain or change in bowel habit Genito-Urinary ROS: No urinary tract symptoms Musculoskeletal ROS: Chronic back pain from degenerative arthritis no change Neurological ROS: No headache or change in vision Dermatological ROS: No rash or ecchymosis. She is concerned with hair loss from the azathioprine. Remaining ROS negative:   Physical Exam: Blood pressure 117/50, pulse 91, temperature 98.4 F (36.9 C), temperature source Oral, weight 169 lb 9.6 oz (76.93 kg), SpO2 96.00%. Wt Readings from Last 3 Encounters:  01/14/14 169 lb 9.6 oz (76.93 kg)  10/22/13 180 lb 4.8 oz (81.784 kg)  09/10/13 171 lb 4.8 oz (77.701 kg)     General appearance: Well-nourished Caucasian woman HENNT: Pharynx no erythema, exudate, mass, or ulcer. No thyromegaly or thyroid nodules Lymph nodes: No cervical, supraclavicular, or axillary lymphadenopathy Breasts:  Lungs: Clear to auscultation, resonant to percussion throughout Heart: Regular rhythm,  no murmur, no gallop, no rub, no click, no edema Abdomen: Soft,  nontender, normal bowel sounds, no mass, no organomegaly Extremities: No edema, no calf tenderness Musculoskeletal: no joint deformities GU:  Vascular: Carotid pulses 2+, no bruits, distal pulses: Dorsalis pedis 1+ symmetric Neurologic: Alert, oriented, PERRLA, optic discs sharp and vessels normal, no hemorrhage or exudate, cranial nerves grossly normal, motor strength 5 over 5, reflexes 2+ symmetric, upper body coordination normal, gait normal, Skin: No rash or ecchymosis  Lab Results: CBC W/Diff    Component Value Date/Time   WBC 4.9 01/14/2014 1014   WBC 10.6* 08/02/2013 1008   WBC 8.6 11/09/2007 1100   RBC 5.11 01/14/2014 1014   RBC 5.11 01/14/2014 1014   RBC 3.99 08/02/2013 1008   RBC 4.74 11/09/2007 1100   HGB 15.9* 01/14/2014 1014   HGB 12.6 08/02/2013 1008   HGB 14.4 11/09/2007 1100   HCT 46.1* 01/14/2014 1014   HCT 38.8 08/02/2013 1008   HCT 42.6 11/09/2007 1100   PLT 143* 01/14/2014 1014   PLT 76* 08/02/2013 1008   PLT 233 11/09/2007 1100   MCV 90.2 01/14/2014 1014   MCV 97.2 08/02/2013 1008   MCV 90 11/09/2007 1100   MCH 31.1 01/14/2014 1014   MCH 31.6 08/02/2013 1008   MCH 30.3 11/09/2007 1100   MCHC 34.5 01/14/2014 1014   MCHC 32.5 08/02/2013 1008   MCHC 33.7 11/09/2007 1100   RDW 14.8 01/14/2014 1014   RDW 14.8* 08/02/2013 1008   RDW 12.5 11/09/2007 1100   LYMPHSABS 1.9 01/14/2014 1014   LYMPHSABS 0.2* 08/02/2013 1008   LYMPHSABS 2.5 11/09/2007 1100   MONOABS 0.5 01/14/2014 1014   MONOABS 0.7 08/02/2013 1008   EOSABS 0.2 01/14/2014 1014   EOSABS 0.0 08/02/2013 1008   EOSABS 0.1 11/09/2007 1100   BASOSABS 0.0 01/14/2014 1014   BASOSABS 0.0 08/02/2013 1008   BASOSABS 0.1 11/09/2007 1100     Chemistry      Component Value Date/Time   NA 141 12/31/2013 1018   NA 140 07/26/2013 1020   K 3.9 12/31/2013 1018   K 4.6 07/26/2013 1020   CL 105 12/31/2013 1018   CL 107 05/22/2012 1023   CO2 28 12/31/2013 1018   CO2 22 07/26/2013 1020   BUN 16 12/31/2013 1018   BUN 35.1* 07/26/2013 1020   CREATININE 1.09 12/31/2013  1018   CREATININE 0.90 08/29/2013 1423   CREATININE 0.9 07/26/2013 1020      Component Value Date/Time   CALCIUM 10.1 12/31/2013 1018   CALCIUM 8.7 07/26/2013 1020   ALKPHOS 49 12/31/2013 1018   ALKPHOS 67 07/26/2013 1020   AST 21 12/31/2013 1018   AST 15 07/26/2013 1020   ALT 12 12/31/2013 1018   ALT 27 07/26/2013 1020   BILITOT 0.5 12/31/2013 1018   BILITOT 0.40 07/26/2013 1020       Impression:  #1. TTP in third relapse I am hoping that today's platelet count is just a random fluctuation. Hemoglobin as noted above is actually rising. LDH in the normal range. I will get a one-week interval count. Continue azathioprine 100 mg daily.  #2. Tobacco addiction  #3. Obstructive airway disease secondary to #2  #4. History of epidural abscess requiring emergency drainage and laminectomy with concomitant enterococcal septicemia and endocarditis.  #5. History of culture-negative sepsis  #6. Chronic back pain secondary to degenerative arthritis of the spine   CC: Patient Care Team: Kirk Ruths, MD as PCP - General (Unknown Physician Specialty)  Annia Belt, MD 9/16/20156:48 PM

## 2014-01-21 ENCOUNTER — Other Ambulatory Visit (INDEPENDENT_AMBULATORY_CARE_PROVIDER_SITE_OTHER): Payer: Medicare Other

## 2014-01-21 DIAGNOSIS — M311 Thrombotic microangiopathy, unspecified: Secondary | ICD-10-CM

## 2014-01-21 DIAGNOSIS — M3119 Other thrombotic microangiopathy: Secondary | ICD-10-CM

## 2014-01-21 LAB — CBC WITH DIFFERENTIAL/PLATELET
Basophils Absolute: 0 10*3/uL (ref 0.0–0.1)
Basophils Relative: 1 % (ref 0–1)
EOS PCT: 3 % (ref 0–5)
Eosinophils Absolute: 0.1 10*3/uL (ref 0.0–0.7)
HCT: 44.5 % (ref 36.0–46.0)
Hemoglobin: 15.6 g/dL — ABNORMAL HIGH (ref 12.0–15.0)
LYMPHS ABS: 1.3 10*3/uL (ref 0.7–4.0)
Lymphocytes Relative: 28 % (ref 12–46)
MCH: 30.7 pg (ref 26.0–34.0)
MCHC: 35.1 g/dL (ref 30.0–36.0)
MCV: 87.6 fL (ref 78.0–100.0)
Monocytes Absolute: 0.6 10*3/uL (ref 0.1–1.0)
Monocytes Relative: 12 % (ref 3–12)
NEUTROS PCT: 56 % (ref 43–77)
Neutro Abs: 2.7 10*3/uL (ref 1.7–7.7)
PLATELETS: 142 10*3/uL — AB (ref 150–400)
RBC: 5.08 MIL/uL (ref 3.87–5.11)
RDW: 16.2 % — AB (ref 11.5–15.5)
WBC: 4.8 10*3/uL (ref 4.0–10.5)

## 2014-01-21 LAB — RETICULOCYTES
ABS Retic: 61 10*3/uL (ref 19.0–186.0)
RBC.: 5.08 MIL/uL (ref 3.87–5.11)
RETIC CT PCT: 1.2 % (ref 0.4–2.3)

## 2014-01-23 ENCOUNTER — Telehealth: Payer: Self-pay | Admitting: *Deleted

## 2014-01-23 NOTE — Telephone Encounter (Signed)
Called pt - no answer; left message that her platelet count is holding at 142,000 and to continue weekly labs. And to call me back if she has any questions.

## 2014-01-23 NOTE — Telephone Encounter (Signed)
Message copied by Ebbie Latus on Thu Jan 23, 2014 11:41 AM ------      Message from: Annia Belt      Created: Tue Jan 21, 2014  7:06 PM       Call pt: platelets holding at 142,000.  Continue to check weekly for now ------

## 2014-01-28 ENCOUNTER — Other Ambulatory Visit (INDEPENDENT_AMBULATORY_CARE_PROVIDER_SITE_OTHER): Payer: Medicare Other

## 2014-01-28 DIAGNOSIS — M311 Thrombotic microangiopathy, unspecified: Secondary | ICD-10-CM

## 2014-01-28 DIAGNOSIS — M3119 Other thrombotic microangiopathy: Secondary | ICD-10-CM

## 2014-01-28 LAB — CBC WITH DIFFERENTIAL/PLATELET
BASOS ABS: 0 10*3/uL (ref 0.0–0.1)
Basophils Relative: 1 % (ref 0–1)
EOS PCT: 6 % — AB (ref 0–5)
Eosinophils Absolute: 0.3 10*3/uL (ref 0.0–0.7)
HCT: 47 % — ABNORMAL HIGH (ref 36.0–46.0)
Hemoglobin: 15.8 g/dL — ABNORMAL HIGH (ref 12.0–15.0)
LYMPHS PCT: 35 % (ref 12–46)
Lymphs Abs: 1.7 10*3/uL (ref 0.7–4.0)
MCH: 30.6 pg (ref 26.0–34.0)
MCHC: 33.6 g/dL (ref 30.0–36.0)
MCV: 90.9 fL (ref 78.0–100.0)
Monocytes Absolute: 0.4 10*3/uL (ref 0.1–1.0)
Monocytes Relative: 8 % (ref 3–12)
Neutro Abs: 2.4 10*3/uL (ref 1.7–7.7)
Neutrophils Relative %: 50 % (ref 43–77)
PLATELETS: 160 10*3/uL (ref 150–400)
RBC: 5.17 MIL/uL — ABNORMAL HIGH (ref 3.87–5.11)
RDW: 15 % (ref 11.5–15.5)
WBC: 4.8 10*3/uL (ref 4.0–10.5)

## 2014-01-28 LAB — RETICULOCYTES
ABS Retic: 67.2 10*3/uL (ref 19.0–186.0)
RBC.: 5.17 MIL/uL — ABNORMAL HIGH (ref 3.87–5.11)
Retic Ct Pct: 1.3 % (ref 0.4–2.3)

## 2014-01-30 ENCOUNTER — Telehealth: Payer: Self-pay | Admitting: *Deleted

## 2014-01-30 NOTE — Telephone Encounter (Signed)
Pt called/informed platelet count back up to 160,000; pt stated "Great".

## 2014-01-30 NOTE — Telephone Encounter (Signed)
Message copied by Ebbie Latus on Thu Jan 30, 2014 11:13 AM ------      Message from: Annia Belt      Created: Tue Jan 28, 2014  6:00 PM       Call pt: platelets back up @ 160,000 ------

## 2014-02-04 ENCOUNTER — Other Ambulatory Visit (INDEPENDENT_AMBULATORY_CARE_PROVIDER_SITE_OTHER): Payer: Medicare Other

## 2014-02-04 DIAGNOSIS — M311 Thrombotic microangiopathy: Secondary | ICD-10-CM

## 2014-02-04 DIAGNOSIS — M3119 Other thrombotic microangiopathy: Secondary | ICD-10-CM

## 2014-02-04 LAB — CBC WITH DIFFERENTIAL/PLATELET
BASOS ABS: 0.1 10*3/uL (ref 0.0–0.1)
Basophils Relative: 1 % (ref 0–1)
EOS PCT: 5 % (ref 0–5)
Eosinophils Absolute: 0.3 10*3/uL (ref 0.0–0.7)
HEMATOCRIT: 46.4 % — AB (ref 36.0–46.0)
HEMOGLOBIN: 16.2 g/dL — AB (ref 12.0–15.0)
Lymphocytes Relative: 32 % (ref 12–46)
Lymphs Abs: 1.7 10*3/uL (ref 0.7–4.0)
MCH: 31 pg (ref 26.0–34.0)
MCHC: 34.9 g/dL (ref 30.0–36.0)
MCV: 88.9 fL (ref 78.0–100.0)
MONO ABS: 0.4 10*3/uL (ref 0.1–1.0)
MONOS PCT: 7 % (ref 3–12)
NEUTROS ABS: 2.9 10*3/uL (ref 1.7–7.7)
Neutrophils Relative %: 55 % (ref 43–77)
Platelets: 156 10*3/uL (ref 150–400)
RBC: 5.22 MIL/uL — ABNORMAL HIGH (ref 3.87–5.11)
RDW: 15.1 % (ref 11.5–15.5)
WBC: 5.2 10*3/uL (ref 4.0–10.5)

## 2014-02-04 LAB — COMPREHENSIVE METABOLIC PANEL
ALBUMIN: 3.9 g/dL (ref 3.5–5.2)
ALT: 10 U/L (ref 0–35)
AST: 20 U/L (ref 0–37)
Alkaline Phosphatase: 64 U/L (ref 39–117)
BUN: 22 mg/dL (ref 6–23)
CALCIUM: 9.7 mg/dL (ref 8.4–10.5)
CHLORIDE: 103 meq/L (ref 96–112)
CO2: 25 meq/L (ref 19–32)
CREATININE: 1.09 mg/dL (ref 0.50–1.10)
Glucose, Bld: 101 mg/dL — ABNORMAL HIGH (ref 70–99)
POTASSIUM: 4.6 meq/L (ref 3.5–5.3)
Sodium: 141 mEq/L (ref 135–145)
Total Bilirubin: 0.4 mg/dL (ref 0.3–1.2)
Total Protein: 7.1 g/dL (ref 6.0–8.3)

## 2014-02-04 LAB — RETICULOCYTES
ABS Retic: 64.7 10*3/uL (ref 19.0–186.0)
RBC.: 5.22 MIL/uL — AB (ref 3.87–5.11)
Retic Ct Pct: 1.24 % (ref 0.4–2.3)

## 2014-02-04 LAB — LACTATE DEHYDROGENASE: LDH: 235 U/L (ref 94–250)

## 2014-02-06 ENCOUNTER — Telehealth: Payer: Self-pay | Admitting: *Deleted

## 2014-02-06 NOTE — Telephone Encounter (Signed)
Pt called/informed platelet count holding@ 156,000 per Dr Beryle Beams. "Thank You" no questions.

## 2014-02-06 NOTE — Telephone Encounter (Signed)
Message copied by Ebbie Latus on Thu Feb 06, 2014  9:34 AM ------      Message from: Annia Belt      Created: Wed Feb 05, 2014  8:03 AM       Call pt:  Platelets holding @ 156,000 ------

## 2014-02-11 ENCOUNTER — Other Ambulatory Visit (INDEPENDENT_AMBULATORY_CARE_PROVIDER_SITE_OTHER): Payer: Medicare Other

## 2014-02-11 DIAGNOSIS — M311 Thrombotic microangiopathy: Secondary | ICD-10-CM

## 2014-02-11 DIAGNOSIS — M3119 Other thrombotic microangiopathy: Secondary | ICD-10-CM

## 2014-02-11 LAB — CBC WITH DIFFERENTIAL/PLATELET
Basophils Absolute: 0 10*3/uL (ref 0.0–0.1)
Basophils Relative: 1 % (ref 0–1)
Eosinophils Absolute: 0.3 10*3/uL (ref 0.0–0.7)
Eosinophils Relative: 6 % — ABNORMAL HIGH (ref 0–5)
HEMATOCRIT: 43.2 % (ref 36.0–46.0)
HEMOGLOBIN: 14.9 g/dL (ref 12.0–15.0)
LYMPHS ABS: 1.5 10*3/uL (ref 0.7–4.0)
Lymphocytes Relative: 35 % (ref 12–46)
MCH: 31.5 pg (ref 26.0–34.0)
MCHC: 34.5 g/dL (ref 30.0–36.0)
MCV: 91.3 fL (ref 78.0–100.0)
MONO ABS: 0.3 10*3/uL (ref 0.1–1.0)
MONOS PCT: 7 % (ref 3–12)
NEUTROS ABS: 2.2 10*3/uL (ref 1.7–7.7)
Neutrophils Relative %: 51 % (ref 43–77)
Platelets: 141 10*3/uL — ABNORMAL LOW (ref 150–400)
RBC: 4.73 MIL/uL (ref 3.87–5.11)
RDW: 15.1 % (ref 11.5–15.5)
WBC: 4.4 10*3/uL (ref 4.0–10.5)

## 2014-02-11 LAB — RETICULOCYTES
ABS RETIC: 59.1 10*3/uL (ref 19.0–186.0)
RBC.: 4.73 MIL/uL (ref 3.87–5.11)
Retic Ct Pct: 1.25 % (ref 0.4–2.3)

## 2014-02-13 ENCOUNTER — Telehealth: Payer: Self-pay | Admitting: *Deleted

## 2014-02-13 NOTE — Telephone Encounter (Signed)
Message copied by Ebbie Latus on Thu Feb 13, 2014  4:29 PM ------      Message from: [name]      Created: Tue Feb 11, 2014  3:42 PM       Call pt: platelets 141,000: will need to continue to monitor weekly ------

## 2014-02-13 NOTE — Telephone Encounter (Signed)
Pt called - no answer left message, that her platelet count is 141,000 and need to continue to monitor labs weekly per Dr Beryle Beams. And to call if she has any questions.

## 2014-02-18 ENCOUNTER — Other Ambulatory Visit (INDEPENDENT_AMBULATORY_CARE_PROVIDER_SITE_OTHER): Payer: Medicare Other

## 2014-02-18 DIAGNOSIS — M311 Thrombotic microangiopathy: Secondary | ICD-10-CM

## 2014-02-18 DIAGNOSIS — M3119 Other thrombotic microangiopathy: Secondary | ICD-10-CM

## 2014-02-18 DIAGNOSIS — D693 Immune thrombocytopenic purpura: Secondary | ICD-10-CM

## 2014-02-18 LAB — CBC WITH DIFFERENTIAL/PLATELET
Basophils Absolute: 0 10*3/uL (ref 0.0–0.1)
Basophils Relative: 1 % (ref 0–1)
Eosinophils Absolute: 0.3 10*3/uL (ref 0.0–0.7)
Eosinophils Relative: 7 % — ABNORMAL HIGH (ref 0–5)
HCT: 43.7 % (ref 36.0–46.0)
HEMOGLOBIN: 15.2 g/dL — AB (ref 12.0–15.0)
LYMPHS ABS: 1.6 10*3/uL (ref 0.7–4.0)
LYMPHS PCT: 34 % (ref 12–46)
MCH: 31.9 pg (ref 26.0–34.0)
MCHC: 34.8 g/dL (ref 30.0–36.0)
MCV: 91.8 fL (ref 78.0–100.0)
MONOS PCT: 6 % (ref 3–12)
Monocytes Absolute: 0.3 10*3/uL (ref 0.1–1.0)
NEUTROS ABS: 2.5 10*3/uL (ref 1.7–7.7)
NEUTROS PCT: 52 % (ref 43–77)
Platelets: 147 10*3/uL — ABNORMAL LOW (ref 150–400)
RBC: 4.76 MIL/uL (ref 3.87–5.11)
RDW: 14.8 % (ref 11.5–15.5)
WBC: 4.8 10*3/uL (ref 4.0–10.5)

## 2014-02-18 LAB — RETICULOCYTES
ABS RETIC: 69.5 10*3/uL (ref 19.0–186.0)
RBC.: 4.76 MIL/uL (ref 3.87–5.11)
Retic Ct Pct: 1.46 % (ref 0.4–2.3)

## 2014-02-19 ENCOUNTER — Telehealth: Payer: Self-pay | Admitting: *Deleted

## 2014-02-19 NOTE — Telephone Encounter (Signed)
Pt called - no answer; left message her platelets are holding @ 147,000 per Dr Beryle Beams and to call if she has any questions.

## 2014-02-19 NOTE — Telephone Encounter (Signed)
Message copied by Ebbie Latus on Wed Feb 19, 2014 10:18 AM ------      Message from: Annia Belt      Created: Tue Feb 18, 2014  1:39 PM       Call pt: platelets holding @ 147,000 ------

## 2014-02-25 ENCOUNTER — Ambulatory Visit (INDEPENDENT_AMBULATORY_CARE_PROVIDER_SITE_OTHER): Payer: Medicare Other | Admitting: Oncology

## 2014-02-25 ENCOUNTER — Encounter: Payer: Self-pay | Admitting: Oncology

## 2014-02-25 ENCOUNTER — Other Ambulatory Visit: Payer: Medicare Other

## 2014-02-25 VITALS — BP 122/54 | HR 93 | Temp 98.1°F | Ht 64.0 in | Wt 168.0 lb

## 2014-02-25 DIAGNOSIS — M311 Thrombotic microangiopathy: Secondary | ICD-10-CM

## 2014-02-25 DIAGNOSIS — M3119 Other thrombotic microangiopathy: Secondary | ICD-10-CM

## 2014-02-25 NOTE — Progress Notes (Signed)
Patient ID: [name], female   DOB: 26-Jan-1941, 73 y.o.   MRN: [id number] Hematology and Oncology Follow Up Visit  LAURELLE SKIVER [id number] [birth year] 73 y.o. 02/25/2014 3:01 PM   Principle Diagnosis: Encounter Diagnosis  Name Primary?  . TTP (thrombotic thrombocytopenic purpura) Yes     Interim History:  Short interim followup visit for this 73 year old woman with multiply relapsed TTP. Most recent relapse in March of this year. Please see recent notes for complete details. She is currently back in remission. She continues on azathioprine 100 mg daily in attempt to maintain the remission. She received initial treatment with plasma exchange, steroids, and Rituxan. Her platelet counts were running in the 200,000 range through July and then started to slowly drift down with recent counts in the range of 141-160,000. Serum LDH which was persistently elevated even after the platelet response, eventually normalized and has been in the normal range since June. Her hemoglobin has been stable and normal.  She has no new complaints today. No bruising or bleeding. Her chronic cough is less than usual. She continues to smoke one pack of cigarettes daily [place]. She is enjoying her new grandson.  Medications: reviewed  Allergies:  Allergies  Allergen Reactions  . Cefuroxime Axetil Other (See Comments)    "jittery"  . Adhesive [Tape] Other (See Comments)    Blisters, band-aide brand   . Other Other (See Comments)    Wool: Reaction is hives [employer] tape: Reaction is blistering   . Sulfa Antibiotics Hives  . Sulfa Drugs Cross Reactors Other (See Comments)    unknown    Review of Systems: See history of present illness  Remaining ROS negative:   Physical Exam: Blood pressure 122/54, pulse 93, temperature 98.1 F (36.7 C), temperature source Oral, height 5\' 4"  (1.626 m), weight 168 lb (76.204 kg), SpO2 93.00%. Wt Readings from Last 3  Encounters:  02/25/14 168 lb (76.204 kg)  01/14/14 169 lb 9.6 oz (76.93 kg)  10/22/13 180 lb 4.8 oz (81.784 kg)     General appearance: Well-nourished Caucasian woman HENNT: Pharynx no erythema, exudate, mass, or ulcer. No thyromegaly or thyroid nodules Lymph nodes: No cervical, supraclavicular, or axillary lymphadenopathy Breasts:  Lungs: Scattered rhonchi no wheezes resonant to percussion throughout Heart: Regular rhythm, no murmur, no gallop, no rub, no click, no edema Abdomen: Soft, nontender, normal bowel sounds, no mass, no organomegaly Extremities: No edema, no calf tenderness Musculoskeletal: no joint deformities GU:  Vascular: Carotid pulses 2+, no bruits, distal pulses: Dorsalis pedis 1+ symmetric Neurologic: Alert, oriented, PERRLA, optic discs sharp and vessels normal, no hemorrhage or exudate, cranial nerves grossly normal, motor strength 5 over 5, reflexes 1+ symmetric, upper body coordination normal, gait normal, Skin: No rash or ecchymosis  Lab Results: CBC W/Diff    Component Value Date/Time   WBC 4.8 02/18/2014 1015   WBC 10.6* 08/02/2013 1008   WBC 8.6 11/09/2007 1100   RBC 4.76 02/18/2014 1015   RBC 4.76 02/18/2014 1015   RBC 3.99 08/02/2013 1008   RBC 4.74 11/09/2007 1100   HGB 15.2* 02/18/2014 1015   HGB 12.6 08/02/2013 1008   HGB 14.4 11/09/2007 1100   HCT 43.7 02/18/2014 1015   HCT 38.8 08/02/2013 1008   HCT 42.6 11/09/2007 1100   PLT 147* 02/18/2014 1015   PLT 76* 08/02/2013 1008   PLT 233 11/09/2007 1100   MCV 91.8 02/18/2014 1015   MCV 97.2 08/02/2013 1008   MCV 90 11/09/2007  1100   MCH 31.9 02/18/2014 1015   MCH 31.6 08/02/2013 1008   MCH 30.3 11/09/2007 1100   MCHC 34.8 02/18/2014 1015   MCHC 32.5 08/02/2013 1008   MCHC 33.7 11/09/2007 1100   RDW 14.8 02/18/2014 1015   RDW 14.8* 08/02/2013 1008   RDW 12.5 11/09/2007 1100   LYMPHSABS 1.6 02/18/2014 1015   LYMPHSABS 0.2* 08/02/2013 1008   LYMPHSABS 2.5 11/09/2007 1100   MONOABS 0.3 02/18/2014 1015   MONOABS 0.7  08/02/2013 1008   EOSABS 0.3 02/18/2014 1015   EOSABS 0.0 08/02/2013 1008   EOSABS 0.1 11/09/2007 1100   BASOSABS 0.0 02/18/2014 1015   BASOSABS 0.0 08/02/2013 1008   BASOSABS 0.1 11/09/2007 1100     Chemistry      Component Value Date/Time   NA 141 02/04/2014 0959   NA 140 07/26/2013 1020   K 4.6 02/04/2014 0959   K 4.6 07/26/2013 1020   CL 103 02/04/2014 0959   CL 107 05/22/2012 1023   CO2 25 02/04/2014 0959   CO2 22 07/26/2013 1020   BUN 22 02/04/2014 0959   BUN 35.1* 07/26/2013 1020   CREATININE 1.09 02/04/2014 0959   CREATININE 0.90 08/29/2013 1423   CREATININE 0.9 07/26/2013 1020      Component Value Date/Time   CALCIUM 9.7 02/04/2014 0959   CALCIUM 8.7 07/26/2013 1020   ALKPHOS 64 02/04/2014 0959   ALKPHOS 67 07/26/2013 1020   AST 20 02/04/2014 0959   AST 15 07/26/2013 1020   ALT 10 02/04/2014 0959   ALT 27 07/26/2013 1020   BILITOT 0.4 02/04/2014 0959   BILITOT 0.40 07/26/2013 1020        Impression:  #1. TTP in third relapse  Platelet count has stabilized. I will decrease frequency of blood work to once a month. . Continue azathioprine 100 mg daily.   #2. Tobacco addiction with no incentive to stop #3. Obstructive airway disease secondary to #2  #4. History of epidural abscess requiring emergency drainage and laminectomy with concomitant enterococcal septicemia and endocarditis.  #5. History of culture-negative sepsis  #6. Chronic back pain secondary to degenerative arthritis of the spine    CC: Patient Care Team: Kirk Ruths, MD as PCP - General (Unknown Physician Specialty)   Annia Belt, MD 10/27/20153:01 PM

## 2014-02-25 NOTE — Patient Instructions (Signed)
Decrease labwork to once a month MD vist 3-4 months

## 2014-03-04 ENCOUNTER — Other Ambulatory Visit: Payer: Medicare Other

## 2014-03-11 ENCOUNTER — Other Ambulatory Visit: Payer: Medicare Other

## 2014-03-18 ENCOUNTER — Other Ambulatory Visit: Payer: Medicare Other

## 2014-03-18 DIAGNOSIS — M3119 Other thrombotic microangiopathy: Secondary | ICD-10-CM

## 2014-03-18 DIAGNOSIS — M311 Thrombotic microangiopathy: Secondary | ICD-10-CM

## 2014-03-18 LAB — RETICULOCYTES
ABS RETIC: 94.1 10*3/uL (ref 19.0–186.0)
RBC.: 4.48 MIL/uL (ref 3.87–5.11)
Retic Ct Pct: 2.1 % (ref 0.4–2.3)

## 2014-03-18 LAB — CBC WITH DIFFERENTIAL/PLATELET
BASOS ABS: 0 10*3/uL (ref 0.0–0.1)
Basophils Relative: 1 % (ref 0–1)
Eosinophils Absolute: 0.3 10*3/uL (ref 0.0–0.7)
Eosinophils Relative: 6 % — ABNORMAL HIGH (ref 0–5)
HCT: 41.8 % (ref 36.0–46.0)
Hemoglobin: 14.3 g/dL (ref 12.0–15.0)
LYMPHS ABS: 1.7 10*3/uL (ref 0.7–4.0)
LYMPHS PCT: 35 % (ref 12–46)
MCH: 31.9 pg (ref 26.0–34.0)
MCHC: 34.2 g/dL (ref 30.0–36.0)
MCV: 93.3 fL (ref 78.0–100.0)
Monocytes Absolute: 0.4 10*3/uL (ref 0.1–1.0)
Monocytes Relative: 8 % (ref 3–12)
NEUTROS PCT: 50 % (ref 43–77)
Neutro Abs: 2.4 10*3/uL (ref 1.7–7.7)
PLATELETS: 145 10*3/uL — AB (ref 150–400)
RBC: 4.48 MIL/uL (ref 3.87–5.11)
RDW: 14.2 % (ref 11.5–15.5)
WBC: 4.8 10*3/uL (ref 4.0–10.5)

## 2014-03-18 LAB — LACTATE DEHYDROGENASE: LDH: 208 U/L (ref 94–250)

## 2014-03-20 ENCOUNTER — Telehealth: Payer: Self-pay | Admitting: *Deleted

## 2014-03-20 NOTE — Telephone Encounter (Signed)
-----   Message from Annia Belt, MD sent at 03/18/2014  2:58 PM EST ----- Call pt platelets stable at 145,000

## 2014-03-20 NOTE — Telephone Encounter (Signed)
Called pt - daughter answered the telephone, pt's platelet count stable @ 145,000 per Dr Beryle Beams. Stated she will relay message to her mother.

## 2014-03-25 ENCOUNTER — Other Ambulatory Visit: Payer: Medicare Other

## 2014-04-01 ENCOUNTER — Other Ambulatory Visit: Payer: Medicare Other

## 2014-04-08 ENCOUNTER — Other Ambulatory Visit: Payer: Medicare Other

## 2014-04-15 ENCOUNTER — Other Ambulatory Visit: Payer: Medicare Other

## 2014-04-22 ENCOUNTER — Other Ambulatory Visit: Payer: Medicare Other

## 2014-04-29 ENCOUNTER — Other Ambulatory Visit: Payer: Medicare Other

## 2014-04-29 ENCOUNTER — Other Ambulatory Visit (INDEPENDENT_AMBULATORY_CARE_PROVIDER_SITE_OTHER): Payer: Medicare Other

## 2014-04-29 DIAGNOSIS — M3119 Other thrombotic microangiopathy: Secondary | ICD-10-CM

## 2014-04-29 DIAGNOSIS — M311 Thrombotic microangiopathy: Secondary | ICD-10-CM

## 2014-04-29 LAB — CBC WITH DIFFERENTIAL/PLATELET
BASOS PCT: 1 % (ref 0–1)
Basophils Absolute: 0 10*3/uL (ref 0.0–0.1)
EOS ABS: 0.2 10*3/uL (ref 0.0–0.7)
Eosinophils Relative: 5 % (ref 0–5)
HEMATOCRIT: 44 % (ref 36.0–46.0)
Hemoglobin: 15.4 g/dL — ABNORMAL HIGH (ref 12.0–15.0)
LYMPHS PCT: 31 % (ref 12–46)
Lymphs Abs: 1.5 10*3/uL (ref 0.7–4.0)
MCH: 33.1 pg (ref 26.0–34.0)
MCHC: 35 g/dL (ref 30.0–36.0)
MCV: 94.6 fL (ref 78.0–100.0)
MONO ABS: 0.4 10*3/uL (ref 0.1–1.0)
MONOS PCT: 8 % (ref 3–12)
MPV: 10.7 fL (ref 8.6–12.4)
Neutro Abs: 2.7 10*3/uL (ref 1.7–7.7)
Neutrophils Relative %: 55 % (ref 43–77)
Platelets: 150 10*3/uL (ref 150–400)
RBC: 4.65 MIL/uL (ref 3.87–5.11)
RDW: 13.6 % (ref 11.5–15.5)
WBC: 4.9 10*3/uL (ref 4.0–10.5)

## 2014-04-29 LAB — RETICULOCYTES
ABS RETIC: 74.4 10*3/uL (ref 19.0–186.0)
RBC.: 4.65 MIL/uL (ref 3.87–5.11)
Retic Ct Pct: 1.6 % (ref 0.4–2.3)

## 2014-04-29 LAB — LACTATE DEHYDROGENASE: LDH: 178 U/L (ref 94–250)

## 2014-04-30 NOTE — Progress Notes (Signed)
Pt called and given message from dr Beryle Beams, she will f/u as usual

## 2014-05-27 ENCOUNTER — Other Ambulatory Visit: Payer: Medicare Other

## 2014-06-24 ENCOUNTER — Ambulatory Visit: Payer: Self-pay | Admitting: Internal Medicine

## 2014-06-24 ENCOUNTER — Other Ambulatory Visit (INDEPENDENT_AMBULATORY_CARE_PROVIDER_SITE_OTHER): Payer: Medicare Other

## 2014-06-24 DIAGNOSIS — M311 Thrombotic microangiopathy: Secondary | ICD-10-CM

## 2014-06-24 DIAGNOSIS — M3119 Other thrombotic microangiopathy: Secondary | ICD-10-CM

## 2014-06-24 LAB — LACTATE DEHYDROGENASE: LDH: 215 U/L (ref 94–250)

## 2014-06-25 LAB — RETICULOCYTES
ABS RETIC: 60.2 10*3/uL (ref 19.0–186.0)
RBC.: 4.63 MIL/uL (ref 3.87–5.11)
Retic Ct Pct: 1.3 % (ref 0.4–2.3)

## 2014-06-25 LAB — CBC WITH DIFFERENTIAL/PLATELET
BASOS ABS: 0 10*3/uL (ref 0.0–0.1)
BASOS PCT: 1 % (ref 0–1)
Eosinophils Absolute: 0.3 10*3/uL (ref 0.0–0.7)
Eosinophils Relative: 7 % — ABNORMAL HIGH (ref 0–5)
HEMATOCRIT: 44.1 % (ref 36.0–46.0)
Hemoglobin: 15.4 g/dL — ABNORMAL HIGH (ref 12.0–15.0)
LYMPHS ABS: 1.4 10*3/uL (ref 0.7–4.0)
Lymphocytes Relative: 30 % (ref 12–46)
MCH: 33.3 pg (ref 26.0–34.0)
MCHC: 34.9 g/dL (ref 30.0–36.0)
MCV: 95.2 fL (ref 78.0–100.0)
MONOS PCT: 12 % (ref 3–12)
MPV: 10.2 fL (ref 8.6–12.4)
Monocytes Absolute: 0.5 10*3/uL (ref 0.1–1.0)
NEUTROS PCT: 50 % (ref 43–77)
Neutro Abs: 2.3 10*3/uL (ref 1.7–7.7)
Platelets: 150 10*3/uL (ref 150–400)
RBC: 4.63 MIL/uL (ref 3.87–5.11)
RDW: 13.7 % (ref 11.5–15.5)
WBC: 4.5 10*3/uL (ref 4.0–10.5)

## 2014-06-26 ENCOUNTER — Telehealth: Payer: Self-pay | Admitting: *Deleted

## 2014-06-26 NOTE — Telephone Encounter (Signed)
Pt called /informed platelet count looks good @ 150,000 per Dr Beryle Beams. No questions per pt.

## 2014-06-26 NOTE — Telephone Encounter (Signed)
-----   Message from Annia Belt, MD sent at 06/25/2014  1:59 PM EST ----- Call pt: plateletts good @ 150,000

## 2014-06-30 ENCOUNTER — Telehealth: Payer: Self-pay | Admitting: Oncology

## 2014-06-30 NOTE — Telephone Encounter (Signed)
Call to patient to confirm appointment for 07/01/14 at 10:45 lmtcb

## 2014-07-01 ENCOUNTER — Ambulatory Visit (INDEPENDENT_AMBULATORY_CARE_PROVIDER_SITE_OTHER): Payer: Medicare Other | Admitting: Oncology

## 2014-07-01 ENCOUNTER — Encounter: Payer: Self-pay | Admitting: Oncology

## 2014-07-01 VITALS — BP 127/49 | HR 91 | Temp 97.9°F | Ht 64.0 in | Wt 167.6 lb

## 2014-07-01 DIAGNOSIS — Z8619 Personal history of other infectious and parasitic diseases: Secondary | ICD-10-CM

## 2014-07-01 DIAGNOSIS — J449 Chronic obstructive pulmonary disease, unspecified: Secondary | ICD-10-CM

## 2014-07-01 DIAGNOSIS — M3119 Other thrombotic microangiopathy: Secondary | ICD-10-CM

## 2014-07-01 DIAGNOSIS — M311 Thrombotic microangiopathy: Secondary | ICD-10-CM

## 2014-07-01 DIAGNOSIS — M479 Spondylosis, unspecified: Secondary | ICD-10-CM

## 2014-07-01 DIAGNOSIS — F17210 Nicotine dependence, cigarettes, uncomplicated: Secondary | ICD-10-CM

## 2014-07-01 NOTE — Progress Notes (Signed)
Patient ID: [name], female   DOB: 03/21/1941, 74 y.o.   MRN: [id number] Hematology and Oncology Follow Up Visit  [name] [id number] 18-Dec-1940 74 y.o. 07/01/2014 11:16 AM   Principle Diagnosis: Encounter Diagnosis  Name Primary?  . TTP (thrombotic thrombocytopenic purpura) Yes   clinical summary:  74 year old lady with multiply relapsed TTP. Initial diagnosis January 2005. Induced into remission with  remission with plasma exchange and steroids. First relapse August 2006. Second remission with plasma exchange and steroids then Rituxan consolidation. Second relapse October 2011 treated with plasma exchange, steroids, and Rituxan. Third relapse March 2015. Treated with plasma exchange, steroids, Rituxan, and currently on maintenance immunosuppression with azathioprine 100 mg daily. Last plasma exchange given on 08/29/2013. She had a persistent unexplained elevation of her LDH through June 2015 which eventually came down to normal.  Course to date has been complicated by intermittent severe infections. Epidural abscess November 2012 requiring emergency decompressive laminectomy at L1-L4 with blood cultures growing enterococcus with subsequent infection of her heart valve.. Culture-negative sepsis and acute bronchitis with probable viral upper respiratory tract infection requiring admission in April 2015 with second admission in April 2015 with recurrent high fever, culture-negative sepsis, and early congestive heart failure complicated by underlying advanced, but not oxygen dependent, obstructive airway disease and ongoing tobacco abuse.  Interim History:   She has now been in remission for 1 year and continues on oral azathioprine. She had some initial mild hair loss which stabilize. I encouraged her to continue on the medication. Her platelet counts have been consistently above 140,000. Hemoglobin has normalized. LDH which was slow to normalize has now been stable for a number  of months. She has has no neurologic complaints.  She continues to smoke and has absolutely no desire to quit or take a smoking cessation aid. She was lucky this winter and did not have to be admitted to the hospital for a respiratory infection or exacerbation of her obstructive airway disease. She is currently being followed by Dr. Frazier Richards at the [facility] clinic for her primary care needs. She is enjoying her new grandson. Husband accompanies her today.  Medications: reviewed  Allergies:  Allergies  Allergen Reactions  . Cefuroxime Axetil Other (See Comments)    "jittery"  . Adhesive [Tape] Other (See Comments)    Blisters, band-aide brand   . Other Other (See Comments)    Wool: Reaction is hives [employer] tape: Reaction is blistering   . Sulfa Antibiotics Hives  . Sulfa Drugs Cross Reactors Other (See Comments)    unknown    Review of Systems: See HPI Remaining ROS negative:   Physical Exam: Blood pressure 127/49, pulse 91, temperature 97.9 F (36.6 C), temperature source Oral, height 5\' 4"  (1.626 m), weight 167 lb 9.6 oz (76.023 kg), SpO2 96 %. Wt Readings from Last 3 Encounters:  07/01/14 167 lb 9.6 oz (76.023 kg)  02/25/14 168 lb (76.204 kg)  01/14/14 169 lb 9.6 oz (76.93 kg)     General appearance: WDWN Caucasian woman HENNT: Pharynx no erythema, exudate, mass, or ulcer. No thyromegaly or thyroid nodules Lymph nodes: No cervical, supraclavicular, or axillary lymphadenopathy Breasts:  Lungs: Clear to auscultation, resonant to percussion throughout Heart: Regular rhythm, no murmur, no gallop, no rub, no click, no edema Abdomen: Soft, nontender, normal bowel sounds, no mass, no organomegaly Extremities: No edema, no calf tenderness Musculoskeletal: no joint deformities GU:  Vascular: Carotid pulses 2+, no bruits, Neurologic: Alert, oriented, PERRLA, , cranial  nerves grossly normal, motor strength 5 over 5, reflexes 2+ symmetric, upper  body coordination normal, gait normal, Skin: No rash or ecchymosis  Lab Results: CBC W/Diff    Component Value Date/Time   WBC 4.5 06/24/2014 1024   WBC 10.6* 08/02/2013 1008   WBC 8.6 11/09/2007 1100   RBC 4.63 06/24/2014 1024   RBC 4.63 06/24/2014 1024   RBC 3.99 08/02/2013 1008   RBC 4.74 11/09/2007 1100   HGB 15.4* 06/24/2014 1024   HGB 12.6 08/02/2013 1008   HGB 14.4 11/09/2007 1100   HCT 44.1 06/24/2014 1024   HCT 38.8 08/02/2013 1008   HCT 42.6 11/09/2007 1100   PLT 150 06/24/2014 1024   PLT 76* 08/02/2013 1008   PLT 233 11/09/2007 1100   MCV 95.2 06/24/2014 1024   MCV 97.2 08/02/2013 1008   MCV 90 11/09/2007 1100   MCH 33.3 06/24/2014 1024   MCH 31.6 08/02/2013 1008   MCH 30.3 11/09/2007 1100   MCHC 34.9 06/24/2014 1024   MCHC 32.5 08/02/2013 1008   MCHC 33.7 11/09/2007 1100   RDW 13.7 06/24/2014 1024   RDW 14.8* 08/02/2013 1008   RDW 12.5 11/09/2007 1100   LYMPHSABS 1.4 06/24/2014 1024   LYMPHSABS 0.2* 08/02/2013 1008   LYMPHSABS 2.5 11/09/2007 1100   MONOABS 0.5 06/24/2014 1024   MONOABS 0.7 08/02/2013 1008   EOSABS 0.3 06/24/2014 1024   EOSABS 0.0 08/02/2013 1008   EOSABS 0.1 11/09/2007 1100   BASOSABS 0.0 06/24/2014 1024   BASOSABS 0.0 08/02/2013 1008   BASOSABS 0.1 11/09/2007 1100     Chemistry      Component Value Date/Time   NA 141 02/04/2014 0959   NA 140 07/26/2013 1020   K 4.6 02/04/2014 0959   K 4.6 07/26/2013 1020   CL 103 02/04/2014 0959   CL 107 05/22/2012 1023   CO2 25 02/04/2014 0959   CO2 22 07/26/2013 1020   BUN 22 02/04/2014 0959   BUN 35.1* 07/26/2013 1020   CREATININE 1.09 02/04/2014 0959   CREATININE 0.90 08/29/2013 1423   CREATININE 0.9 07/26/2013 1020      Component Value Date/Time   CALCIUM 9.7 02/04/2014 0959   CALCIUM 8.7 07/26/2013 1020   ALKPHOS 64 02/04/2014 0959   ALKPHOS 67 07/26/2013 1020   AST 20 02/04/2014 0959   AST 15 07/26/2013 1020   ALT 10 02/04/2014 0959   ALT 27 07/26/2013 1020   BILITOT 0.4  02/04/2014 0959   BILITOT 0.40 07/26/2013 1020    LDH 215 on 06/24/2014;  Impression:  #1. TTP in third relapse  Blood counts have stabilized. Now out 1 year from most recent recurrence. I will decrease frequency of blood work to every 3 months  Continue azathioprine 100 mg daily.   #2. Tobacco addiction with no incentive to stop #3. Obstructive airway disease secondary to #2  #4. History of epidural abscess requiring emergency drainage and laminectomy with concomitant enterococcal septicemia and endocarditis. 1/12 #5. History of culture-negative sepsis 4/15 #6. Chronic back pain secondary to degenerative arthritis of the spine  CC: Patient Care Team: Kirk Ruths, MD as PCP - General (Unknown Physician Specialty)   Annia Belt, MD 3/1/201611:16 AM

## 2014-07-01 NOTE — Patient Instructions (Signed)
Change cbc to every 3 months effective 06/23/14; chemistry profile every 6 months, next 3/22 Continue Imuran (azathioprine) same dose MD visit 6 months

## 2014-07-22 ENCOUNTER — Other Ambulatory Visit (INDEPENDENT_AMBULATORY_CARE_PROVIDER_SITE_OTHER): Payer: Medicare Other

## 2014-07-22 DIAGNOSIS — M3119 Other thrombotic microangiopathy: Secondary | ICD-10-CM

## 2014-07-22 DIAGNOSIS — M311 Thrombotic microangiopathy: Secondary | ICD-10-CM

## 2014-07-22 LAB — CBC WITH DIFFERENTIAL/PLATELET
Basophils Absolute: 0.1 10*3/uL (ref 0.0–0.1)
Basophils Relative: 2 % — ABNORMAL HIGH (ref 0–1)
Eosinophils Absolute: 0.3 10*3/uL (ref 0.0–0.7)
Eosinophils Relative: 7 % — ABNORMAL HIGH (ref 0–5)
HEMATOCRIT: 44.9 % (ref 36.0–46.0)
Hemoglobin: 15.7 g/dL — ABNORMAL HIGH (ref 12.0–15.0)
LYMPHS ABS: 1.5 10*3/uL (ref 0.7–4.0)
Lymphocytes Relative: 31 % (ref 12–46)
MCH: 33.3 pg (ref 26.0–34.0)
MCHC: 35 g/dL (ref 30.0–36.0)
MCV: 95.1 fL (ref 78.0–100.0)
MONO ABS: 0.5 10*3/uL (ref 0.1–1.0)
MONOS PCT: 10 % (ref 3–12)
MPV: 10.5 fL (ref 8.6–12.4)
NEUTROS ABS: 2.5 10*3/uL (ref 1.7–7.7)
Neutrophils Relative %: 50 % (ref 43–77)
Platelets: 149 10*3/uL — ABNORMAL LOW (ref 150–400)
RBC: 4.72 MIL/uL (ref 3.87–5.11)
RDW: 13.5 % (ref 11.5–15.5)
WBC: 4.9 10*3/uL (ref 4.0–10.5)

## 2014-07-22 LAB — RETICULOCYTES
ABS RETIC: 61.4 10*3/uL (ref 19.0–186.0)
RBC.: 4.72 MIL/uL (ref 3.87–5.11)
RETIC CT PCT: 1.3 % (ref 0.4–2.3)

## 2014-07-23 ENCOUNTER — Telehealth: Payer: Self-pay | Admitting: *Deleted

## 2014-07-23 LAB — COMPREHENSIVE METABOLIC PANEL
ALK PHOS: 64 U/L (ref 39–117)
ALT: 12 U/L (ref 0–35)
AST: 21 U/L (ref 0–37)
Albumin: 4.3 g/dL (ref 3.5–5.2)
BUN: 21 mg/dL (ref 6–23)
CHLORIDE: 107 meq/L (ref 96–112)
CO2: 26 meq/L (ref 19–32)
CREATININE: 1.07 mg/dL (ref 0.50–1.10)
Calcium: 9.6 mg/dL (ref 8.4–10.5)
GLUCOSE: 96 mg/dL (ref 70–99)
Potassium: 4.6 mEq/L (ref 3.5–5.3)
Sodium: 139 mEq/L (ref 135–145)
TOTAL PROTEIN: 6.4 g/dL (ref 6.0–8.3)
Total Bilirubin: 0.3 mg/dL (ref 0.2–1.2)

## 2014-07-23 LAB — LACTATE DEHYDROGENASE: LDH: 186 U/L (ref 94–250)

## 2014-07-23 NOTE — Telephone Encounter (Signed)
-----   Message from Annia Belt, MD sent at 07/23/2014  8:56 AM EDT ----- Call pt: lab stable platelets 149,000

## 2014-07-23 NOTE — Telephone Encounter (Signed)
Pt called - not at home. Talked to her daughter- informed her, pt's labs are stable and Platelets are 149,000. She asked about pt's Hgb which is 15.7. "Great", stated she will let her mother know her results.

## 2014-08-22 NOTE — H&P (Signed)
PATIENT NAME:  [name], HAPP MR#:  [id number] DATE OF BIRTH:  08-26-1940  DATE OF ADMISSION:  09/13/2012  PRIMARY CARE PHYSICIAN: Frazier Richards, MD   CHIEF COMPLAINT: Drooling from her mouth and numbness in her mouth with lightheadedness.   HISTORY OF PRESENTING ILLNESS: A 74 year old female patient with history of hyperlipidemia, smoker, prior transient ischemic attack and history of TTP presented to the Emergency Room brought in by her family after the patient noticed acute onset of drooling from her mouth, unable to keep water or pills in the mouth. She also had a numb feeling in the mouth, then felt lightheaded, slept. Although the patient is not sure how long these symptoms lasted, when she woke up 3 to 4 hours later her symptoms had resolved. With the patient's risk factors of prior TIA, hyperlipidemia, smoking, the patient is being admitted for TIA work-up at this time. The patient's last episode of TIA was acute onset of aphasia which lasted only a minute, and also during that admission she was diagnosed with a TTP in the past.   Presently she feels well. No vision changes. No dysphagia. No focal motor deficits, no sensory changes. Her CT scan of the head is normal, showing no acute changes.   PAST MEDICAL HISTORY: 1.  TTP.  2.  Chronic obstructive pulmonary disease.  3.  Tobacco abuse.  4.  Hypercholesterolemia.  5.  Chronic back pain.  6.  Spinal stenosis.  7.  History of spinal epidural abscess.   PAST SURGICAL HISTORY: C-section and tubal ligation.   DRUG ALLERGIES: TO SULFA MEDICATIONS, REACTION UNKNOWN.   FAMILY HISTORY: Her mother had coronary artery disease and heart attack in her 43s. Father died of brain cancer. Her sister has leukemia.  SOCIAL HISTORY: The patient is married, lives with her husband. Ambulates on her own. Smokes 2 packs of cigarettes a day. No illicit drugs or alcohol. She is independent with activities of daily living.   CODE STATUS:  FULL CODE.      REVIEW OF SYSTEMS: CONSTITUTIONAL: No fever, fatigue, weakness.  EYES: No blurred vision, pain, redness.  ENT: No tinnitus, ear pain, hearing loss.  RESPIRATORY: Has on-and-off wheezing at times along with cough. Smokes 2 packs.  CARDIOVASCULAR: No chest pain, orthopnea, edema.  GASTROINTESTINAL: No nausea, vomiting, diarrhea, abdominal pain.  GENITOURINARY: No dysuria, hematuria or frequency.  ENDOCRINE: No polyuria, nocturia, thyroid problems.  HEMATOLOGIC/LYMPHATIC: Has history of TTP. No anemia, no present easy bruising or bleeding.  INTEGUMENTARY: Not any rash, lesions.  MUSCULOSKELETAL: Has some chronic back pain.  No arthritis.  NEUROLOGICAL: Had the drooling from her mouth and numbness. No seizure.  PSYCHIATRIC: No anxiety or depression.   HOME MEDICATIONS: Include: 1.  Albuterol nebulizer 2 times a day as needed.  2.  Artificial tears 1 to 2 drops in each affected eye once a day.  3.  Azathioprine 50 mg oral 2 times a day.  4.  Calcium with vitamin D 1 tablet oral 2 times a day.  5.  Crestor 20 mg oral once a day.  6.  Prilosec 20 mg oral once a day.  7.  Folic acid 0.8 mg oral once a day.  8.  Gemfibrozil 600 mg oral once a day.  9.  Multivitamin 1 tablet oral once a day.  10.  Potassium chloride 20 mEq oral once a day.  11.  Prednisone 20 mg 2 tablets oral once a day.  12.  Spiriva 18 mcg inhaled once a day.  13.  Ventolin HFA 2 puffs inhaled 4 times a day as needed.  14.  Vitamin B 100 mg oral once a day.  15.  Vitamin B12 1000 mcg oral once a day.  16.  Vitamin C 1000 mg oral once a day.  17.  Vitamin E 400 international units oral once a day.   The patient is not sure if she is taking the prednisone and azathioprine at this time.   PHYSICAL EXAMINATION: VITAL SIGNS: Temperature 98.3, pulse of 89, blood pressure 131/59, saturating 95% on room air.  GENERAL: Obese Caucasian female patient sitting up in bed, comfortable, conversational, cooperative with exam.   PSYCHIATRIC: Alert, oriented x 3. Mood and affect appropriate. Judgment intact.  HEENT: Atraumatic, normocephalic. Oral mucosa moist and pink. External ears and nose normal. No pallor. No icterus. Pupils bilaterally equal and react to light.  NECK: Supple. No thyromegaly. No palpable lymph nodes. Trachea midline. No carotid bruit.  CARDIOVASCULAR: S1, S2, without any murmurs. Peripheral pulses 2+. No edema.  RESPIRATORY: Good air entry on both sides without wheezing. Normal work of breathing.  GASTROINTESTINAL: Soft abdomen, nontender. Bowel sounds present. No hepatosplenomegaly palpable.  GENITOURINARY: No CVA tenderness, bladder distention.  SKIN: Warm and dry. No petechiae, rash, ulcers.  MUSCULOSKELETAL: No joint swelling, redness, effusion of the large joints. Normal muscle tone. No spinal tenderness.  LYMPHATIC: No cervical lymphadenopathy.  NEUROLOGICAL: Motor strength 5 out of 5 in upper and lower extremities. Sensation to fine touch is intact all over. Cranial nerves II through XII are intact. Gait tested and normal.   LABORATORY AND RADIOLOGICAL DATA:  Lab studies show glucose of 139, BUN 16, creatinine 0.94, bicarbonate 27, GFR greater than 60. AST, ALT, alkaline phosphatase, bilirubin normal. WBC 7.2, hemoglobin 15.5, platelets of 141, INR 0.9.  CT scan of the head without contrast shows chronic and involutional changes without evidence of acute abnormalities. Comparison was made to a CT scan of the head from November 2012.   ASSESSMENT AND PLAN: 1.  Transient ischemic attack symptoms with drooling from the mouth:  Unknown duration of symptoms, but presently symptoms have resolved. The patient feels back to baseline, but with risk factors of hyperlipidemia, prior TIA and smoking we will admit the patient on a tele floor.  We will get an MRI of the brain without contrast, 2-D echocardiogram and carotid Dopplers.  Check a fasting lipid profile. I have counseled the patient to quit  smoking for more than 3 minutes. She continues to smoke 2 packs of cigarettes, does not seem to be motivated at this time. She mentions that she understands the risks of smoking but continues to do so. Symptoms have resolved, will not need any neuro checks or PT, OT evaluation. Start her on a cardiac diet. Continue her statin.  Start the patient on aspirin. Presently no acute symptoms of TTP.  2.  Mild thrombocytopenia:  Seems  chronic, no petechia or bleeding.  3.  Chronic obstructive pulmonary disease, stable:  The patient continues to smoke. Will use her home inhalers p.r.n.  4.  Deep vein thrombosis prophylaxis: No heparin products secondary to the mild thrombocytopenia and history of TTP.   CODE STATUS:  FULL CODE.     TIME SPENT TODAY ON THIS CASE:  45 minutes.   ____________________________ Leia Alf Jamorion Gomillion, MD srs:cb D: 09/13/2012 21:48:21 ET T: 09/13/2012 22:04:55 ET JOB#: [id number]  cc: Alveta Heimlich R. Khaleel Beckom, MD, <Dictator> Ocie Cornfield. Ouida Sills, MD Neita Carp MD ELECTRONICALLY SIGNED 10/01/2012 23:26

## 2014-09-02 ENCOUNTER — Other Ambulatory Visit: Payer: Self-pay | Admitting: *Deleted

## 2014-09-02 MED ORDER — AZATHIOPRINE 50 MG PO TABS: 100.0000 mg | ORAL_TABLET | Freq: Every day | ORAL | Status: DC

## 2014-09-02 NOTE — Telephone Encounter (Signed)
Will take last tabs today. Thanks

## 2014-10-22 ENCOUNTER — Other Ambulatory Visit (INDEPENDENT_AMBULATORY_CARE_PROVIDER_SITE_OTHER): Payer: Medicare Other

## 2014-10-22 DIAGNOSIS — M3119 Other thrombotic microangiopathy: Secondary | ICD-10-CM

## 2014-10-22 DIAGNOSIS — M311 Thrombotic microangiopathy: Secondary | ICD-10-CM | POA: Diagnosis not present

## 2014-10-22 LAB — CBC WITH DIFFERENTIAL/PLATELET
Basophils Absolute: 0 10*3/uL (ref 0.0–0.1)
Basophils Relative: 0 % (ref 0–1)
Eosinophils Absolute: 0.2 10*3/uL (ref 0.0–0.7)
Eosinophils Relative: 4 % (ref 0–5)
HEMATOCRIT: 43.5 % (ref 36.0–46.0)
Hemoglobin: 15.1 g/dL — ABNORMAL HIGH (ref 12.0–15.0)
LYMPHS PCT: 25 % (ref 12–46)
Lymphs Abs: 1.3 10*3/uL (ref 0.7–4.0)
MCH: 32.5 pg (ref 26.0–34.0)
MCHC: 34.7 g/dL (ref 30.0–36.0)
MCV: 93.5 fL (ref 78.0–100.0)
MONO ABS: 0.4 10*3/uL (ref 0.1–1.0)
MONOS PCT: 8 % (ref 3–12)
MPV: 9.9 fL (ref 8.6–12.4)
NEUTROS ABS: 3.3 10*3/uL (ref 1.7–7.7)
NEUTROS PCT: 63 % (ref 43–77)
Platelets: 162 10*3/uL (ref 150–400)
RBC: 4.65 MIL/uL (ref 3.87–5.11)
RDW: 14.4 % (ref 11.5–15.5)
WBC: 5.2 10*3/uL (ref 4.0–10.5)

## 2014-10-22 LAB — RETICULOCYTES
ABS Retic: 83.7 10*3/uL (ref 19.0–186.0)
RBC.: 4.65 MIL/uL (ref 3.87–5.11)
RETIC CT PCT: 1.8 % (ref 0.4–2.3)

## 2014-10-24 ENCOUNTER — Telehealth: Payer: Self-pay | Admitting: *Deleted

## 2014-10-24 NOTE — Telephone Encounter (Signed)
-----   Message from Annia Belt, MD sent at 10/23/2014  2:17 PM EDT ----- Call pt: counts good. [employer]

## 2014-10-24 NOTE — Telephone Encounter (Signed)
Pt called / informed blood counts are good per Dr Beryle Beams. And have a good trip to [place].

## 2014-10-30 IMAGING — CR DG FOOT COMPLETE 3+V*R*
3 series · 3 of 3 positions shown · non-contrast
Comparison: None.

CLINICAL DATA: Foot pain, swelling, bruise

EXAM:
RIGHT FOOT COMPLETE - 3+ VIEW

[x foot ap right]
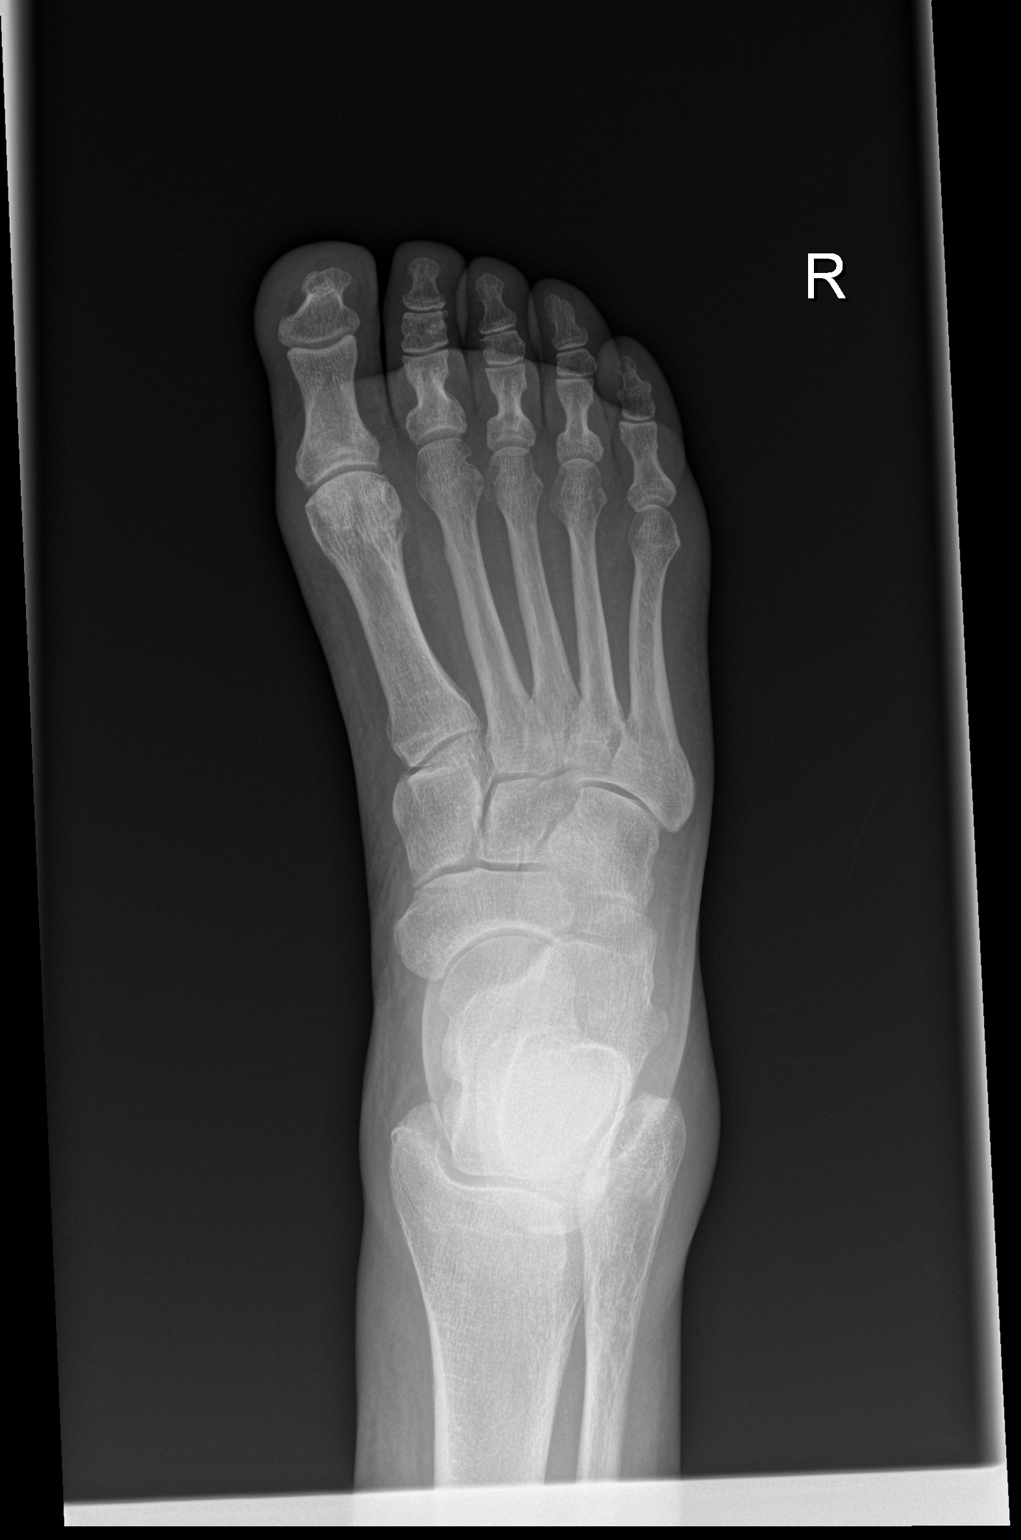

[x foot obl right]
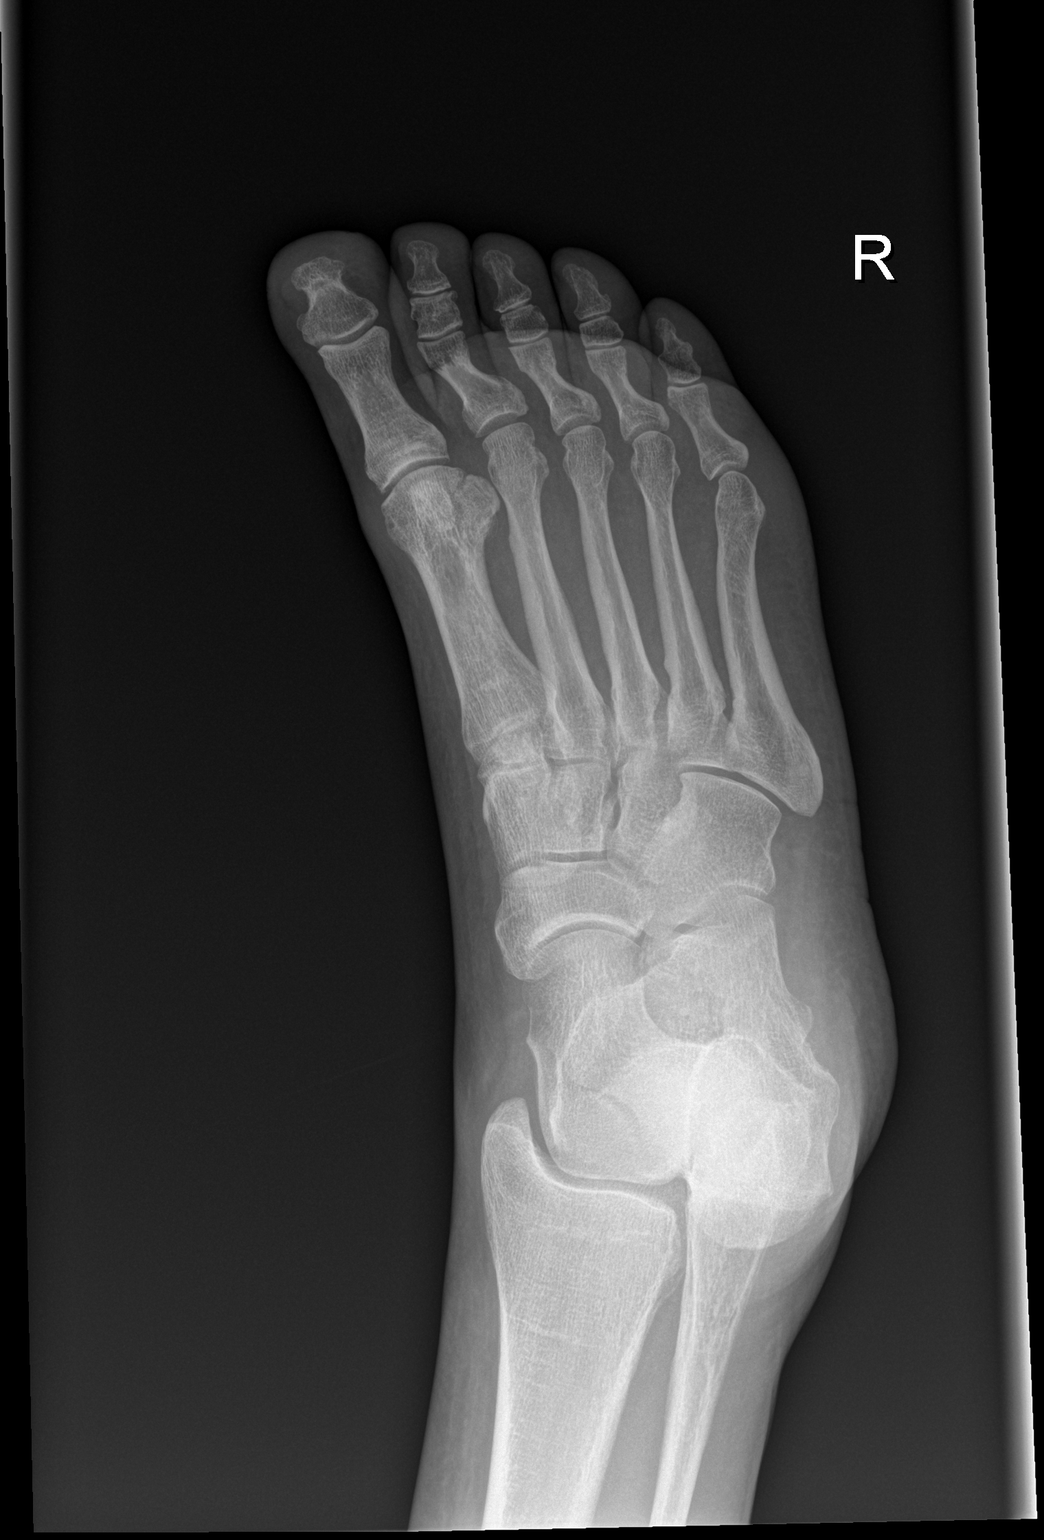

[x foot lat right]
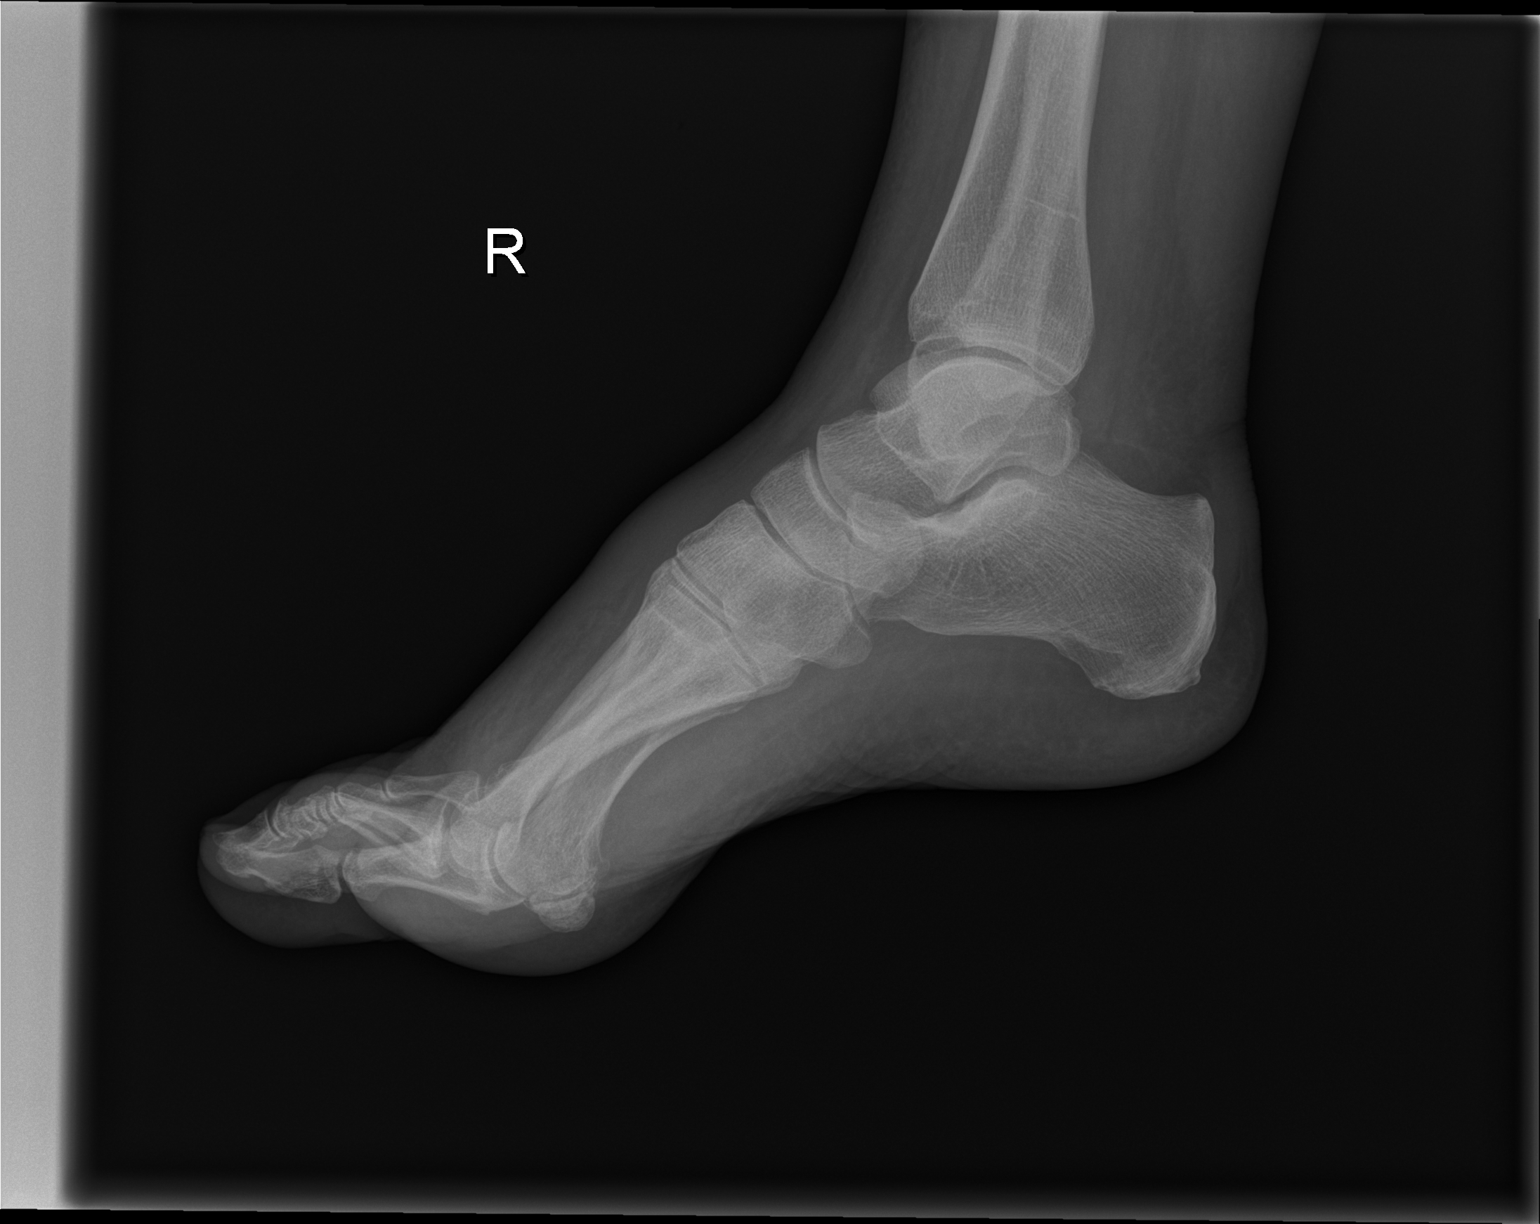

[3 of 3 positions shown; findings below may reference images not displayed]

FINDINGS: No acute fracture or malalignment. Normal bony mineralization. Soft
tissue swelling over the dorsal aspect of the foot. No significant
degenerative change. No ankle joint effusion.
IMPRESSION: No acute osseous abnormality. Soft tissue swelling over the dorsal
forefoot.

## 2014-10-31 IMAGING — CR DG CHEST 1V PORT
1 series · 1 of 1 positions shown · non-contrast
Comparison: DG CHEST 1V PORT dated 08/19/2013

CLINICAL DATA: Cough, congestion

EXAM:
PORTABLE CHEST - 1 VIEW

[AP]
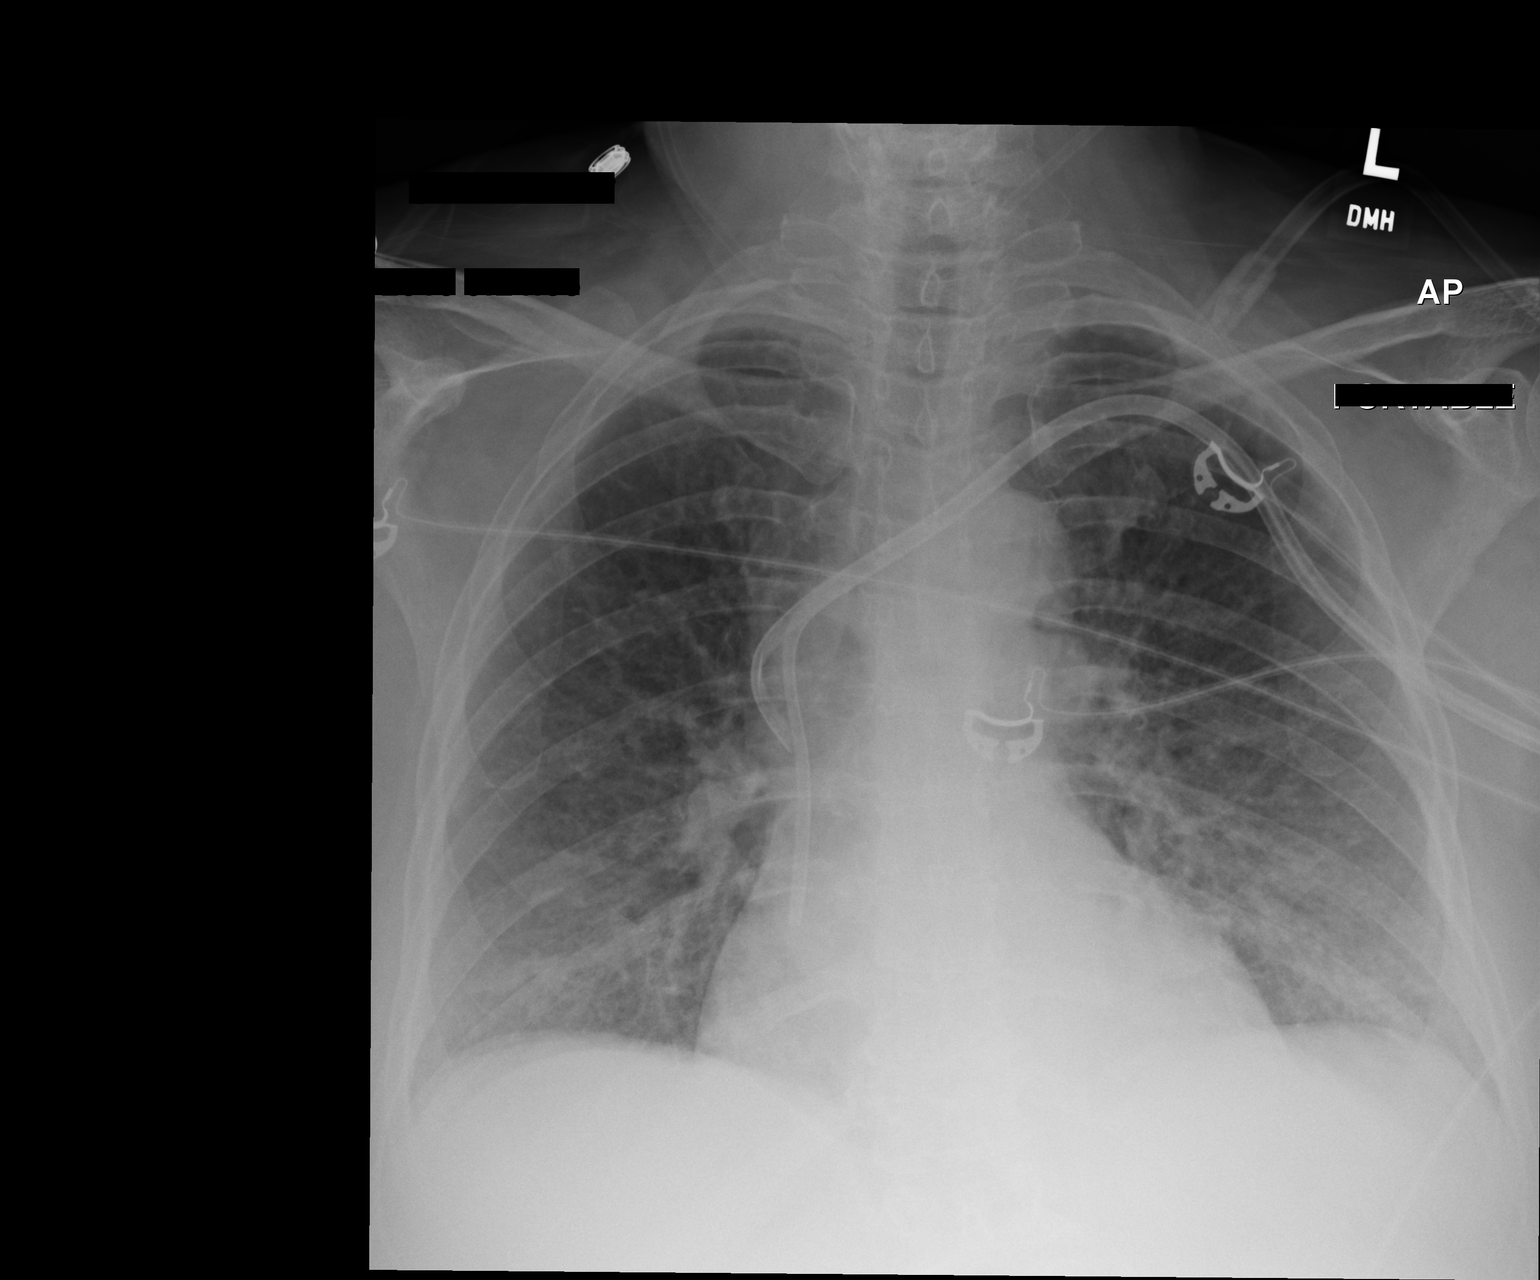

[1 of 1 positions shown; findings below may reference images not displayed]

FINDINGS: There is a dual-lumen left-sided central venous catheter in
satisfactory position. There is bilateral mild interstitial
thickening. No pleural effusion or pneumothorax. The heart and
mediastinal contours are unremarkable.

The osseous structures are unremarkable.
IMPRESSION: Bilateral mild interstitial thickening which may reflect mild
interstitial edema versus mild bronchitis or atypical infection.

## 2014-12-08 ENCOUNTER — Telehealth: Payer: Self-pay | Admitting: *Deleted

## 2014-12-08 NOTE — Telephone Encounter (Signed)
Receved faxed form from [place] requesting that pt's pharmacy run claim under Medicare Part B. Pt's pharmacy was contacted but stated that a PA was longer required, problem was resolved. Phone call complete.Despina Hidden Cassady8/8/20162:22 PM

## 2015-01-22 ENCOUNTER — Other Ambulatory Visit (INDEPENDENT_AMBULATORY_CARE_PROVIDER_SITE_OTHER): Payer: Medicare Other

## 2015-01-22 DIAGNOSIS — M311 Thrombotic microangiopathy: Secondary | ICD-10-CM | POA: Diagnosis not present

## 2015-01-22 DIAGNOSIS — M3119 Other thrombotic microangiopathy: Secondary | ICD-10-CM

## 2015-01-23 LAB — CBC WITH DIFFERENTIAL/PLATELET
BASOS ABS: 0 10*3/uL (ref 0.0–0.2)
Basos: 1 %
EOS (ABSOLUTE): 0.2 10*3/uL (ref 0.0–0.4)
EOS: 3 %
HEMATOCRIT: 45.5 % (ref 34.0–46.6)
HEMOGLOBIN: 15.7 g/dL (ref 11.1–15.9)
Immature Grans (Abs): 0 10*3/uL (ref 0.0–0.1)
Immature Granulocytes: 0 %
LYMPHS: 25 %
Lymphocytes Absolute: 1.5 10*3/uL (ref 0.7–3.1)
MCH: 32.2 pg (ref 26.6–33.0)
MCHC: 34.5 g/dL (ref 31.5–35.7)
MCV: 93 fL (ref 79–97)
MONOCYTES: 8 %
Monocytes Absolute: 0.5 10*3/uL (ref 0.1–0.9)
Neutrophils Absolute: 3.8 10*3/uL (ref 1.4–7.0)
Neutrophils: 63 %
Platelets: 195 10*3/uL (ref 150–379)
RBC: 4.87 x10E6/uL (ref 3.77–5.28)
RDW: 13.6 % (ref 12.3–15.4)
WBC: 5.9 10*3/uL (ref 3.4–10.8)

## 2015-01-23 LAB — RETICULOCYTES: RETIC CT PCT: 1.2 % (ref 0.6–2.6)

## 2015-01-27 ENCOUNTER — Encounter: Payer: Self-pay | Admitting: Oncology

## 2015-01-27 ENCOUNTER — Ambulatory Visit: Payer: Medicare Other | Admitting: Oncology

## 2015-01-27 ENCOUNTER — Telehealth: Payer: Self-pay | Admitting: *Deleted

## 2015-01-27 NOTE — Telephone Encounter (Signed)
Pt called - no answer; left message Platelet count is 195,000 petr Dr Beryle Beams. And to call if she has any questions.

## 2015-01-27 NOTE — Progress Notes (Signed)
Patient ID: [name], female   DOB: [birth year], 74 y.o.   MRN: [id number] 74 year old woman with multiply relapsed TTP currently in remission. She failed to report for her visit today. She is usually very compliant.

## 2015-01-27 NOTE — Telephone Encounter (Signed)
-----   Message from Annia Belt, MD sent at 01/24/2015  6:14 PM EDT ----- Call pt platlets 195,000

## 2015-02-03 ENCOUNTER — Encounter: Payer: Self-pay | Admitting: Oncology

## 2015-02-03 ENCOUNTER — Ambulatory Visit (INDEPENDENT_AMBULATORY_CARE_PROVIDER_SITE_OTHER): Payer: Medicare Other | Admitting: Oncology

## 2015-02-03 VITALS — BP 129/53 | HR 84 | Temp 98.3°F | Ht 64.0 in | Wt 160.0 lb

## 2015-02-03 DIAGNOSIS — F17200 Nicotine dependence, unspecified, uncomplicated: Secondary | ICD-10-CM | POA: Diagnosis not present

## 2015-02-03 DIAGNOSIS — J449 Chronic obstructive pulmonary disease, unspecified: Secondary | ICD-10-CM

## 2015-02-03 DIAGNOSIS — M47819 Spondylosis without myelopathy or radiculopathy, site unspecified: Secondary | ICD-10-CM

## 2015-02-03 DIAGNOSIS — M3119 Other thrombotic microangiopathy: Secondary | ICD-10-CM

## 2015-02-03 DIAGNOSIS — M311 Thrombotic microangiopathy: Secondary | ICD-10-CM | POA: Diagnosis not present

## 2015-02-03 DIAGNOSIS — Z8619 Personal history of other infectious and parasitic diseases: Secondary | ICD-10-CM | POA: Diagnosis not present

## 2015-02-03 DIAGNOSIS — G8929 Other chronic pain: Secondary | ICD-10-CM

## 2015-02-03 NOTE — Patient Instructions (Signed)
Continue lab every 3 months: see standing orders MD visit  6 months

## 2015-02-04 NOTE — Progress Notes (Signed)
Patient ID: [name], female   DOB: 03-Dec-1940, 74 y.o.   MRN: [id number] Hematology and Oncology Follow Up Visit  [name] [id number] 06/23/1940 74 y.o. 02/04/2015 10:10 AM   Principle Diagnosis: Encounter Diagnosis  Name Primary?  . 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 Initial diagnosis January 2005. Induced into remission withplasma exchange and steroids then Rituxan consolidation. Second relapse October 2011 treated with plasma exchange, steroids, and Rituxan. Third relapse March 2015. Treated with plasma exchange, steroids, Rituxan, and currently on maintenance immunosuppression with azathioprine 100 mg daily. Last plasma exchange given on 08/29/2013. She had a persistent unexplained elevation of her LDH through June 2015 which eventually came down to normal.  Interim History:   She remains a recalcitrant smoker and I advised her on smoking cessation again today. She has a chronic cough. Her beautician cut her a new hair care product and she is no longer losing her hair. This was her concern and she thought it might be due to the azathioprine which I was not willing to stop for minor hair loss. No change in the pattern of her chronic back pain. No new areas of pain.   Medications: reviewed  Allergies:  Allergies  Allergen Reactions  . Cefuroxime Axetil Other (See Comments)    "jittery"  . Adhesive [Tape] Other (See Comments)    Blisters, band-aide brand   . Other Other (See Comments)    Wool: Reaction is hives [employer] tape: Reaction is blistering   . Sulfa Antibiotics Hives  . Sulfa Drugs Cross Reactors Other (See Comments)    unknown    Review of Systems: See history of present illness Remaining ROS negative:   Physical Exam: Blood pressure 129/53, pulse 84, temperature 98.3 F (36.8 C), temperature source Oral, height 5\' 4"  (1.626 m), weight 160  lb (72.576 kg), SpO2 95 %. Wt Readings from Last 3 Encounters:  02/03/15 160 lb (72.576 kg)  07/01/14 167 lb 9.6 oz (76.023 kg)  02/25/14 168 lb (76.204 kg)     General appearance: Well-nourished Caucasian woman HENNT: Pharynx no erythema, exudate, mass, or ulcer. No thyromegaly or thyroid nodules Lymph nodes: No cervical, supraclavicular, or axillary lymphadenopathy Breasts:  Lungs: Scattered wheezes and rhonchi bilaterally. resonant to percussion throughout Heart: Regular rhythm, no murmur, no gallop, no rub, no click, no edema Abdomen: Soft, nontender, normal bowel sounds, no mass, no organomegaly Extremities: No edema, no calf tenderness Musculoskeletal: no joint deformities GU:  Vascular: Carotid pulses 2+, no bruits,  Neurologic: Alert, oriented, PERRLA, optic discs sharp and vessels normal, no hemorrhage or exudate, cranial nerves grossly normal, motor strength 5 over 5, reflexes 1+ symmetric, upper body coordination normal, gait normal, Skin: No rash or ecchymosis  Lab Results: CBC W/Diff    Component Value Date/Time   WBC 5.9 01/22/2015 1053   WBC 5.2 10/22/2014 1114   WBC 10.6* 08/02/2013 1008   WBC 7.2 09/13/2012 1702   RBC 4.87 01/22/2015 1053   RBC 4.65 10/22/2014 1114   RBC 4.65 10/22/2014 1114   RBC 3.99 08/02/2013 1008   RBC 4.84 09/13/2012 1702   RBC 4.74 11/09/2007 1100   HGB 15.1* 10/22/2014 1114   HGB 12.6 08/02/2013 1008   HGB 15.5 09/13/2012 1702   HGB 14.4 11/09/2007 1100   HCT 45.5 01/22/2015 1053   HCT 43.5 10/22/2014 1114   HCT 38.8 08/02/2013 1008   HCT 43.7 09/13/2012 1702   HCT  42.6 11/09/2007 1100   PLT 162 10/22/2014 1114   PLT 76* 08/02/2013 1008   PLT 141* 09/13/2012 1702   PLT 233 11/09/2007 1100   MCV 93.5 10/22/2014 1114   MCV 97.2 08/02/2013 1008   MCV 90 09/13/2012 1702   MCV 90 11/09/2007 1100   MCH 32.2 01/22/2015 1053   MCH 32.5 10/22/2014 1114   MCH 31.6 08/02/2013 1008   MCH 32.0 09/13/2012 1702   MCH 30.3 11/09/2007  1100   MCHC 34.5 01/22/2015 1053   MCHC 34.7 10/22/2014 1114   MCHC 32.5 08/02/2013 1008   MCHC 35.4 09/13/2012 1702   MCHC 33.7 11/09/2007 1100   RDW 13.6 01/22/2015 1053   RDW 14.4 10/22/2014 1114   RDW 14.8* 08/02/2013 1008   RDW 13.3 09/13/2012 1702   RDW 12.5 11/09/2007 1100   LYMPHSABS 1.5 01/22/2015 1053   LYMPHSABS 1.3 10/22/2014 1114   LYMPHSABS 0.2* 08/02/2013 1008   LYMPHSABS 3.2 09/13/2012 1702   LYMPHSABS 2.5 11/09/2007 1100   MONOABS 0.4 10/22/2014 1114   MONOABS 0.7 08/02/2013 1008   MONOABS 0.6 09/13/2012 1702   EOSABS 0.2 10/22/2014 1114   EOSABS 0.0 08/02/2013 1008   EOSABS 0.3 09/13/2012 1702   EOSABS 0.1 11/09/2007 1100   BASOSABS 0.0 01/22/2015 1053   BASOSABS 0.0 10/22/2014 1114   BASOSABS 0.0 08/02/2013 1008   BASOSABS 0.1 09/13/2012 1702   BASOSABS 0.1 11/09/2007 1100     Chemistry      Component Value Date/Time   NA 139 07/22/2014 1113   NA 140 07/26/2013 1020   NA 140 09/13/2012 1702   K 4.6 07/22/2014 1113   K 4.6 07/26/2013 1020   K 4.0 09/13/2012 1702   CL 107 07/22/2014 1113   CL 108* 09/13/2012 1702   CL 107 05/22/2012 1023   CO2 26 07/22/2014 1113   CO2 22 07/26/2013 1020   CO2 27 09/13/2012 1702   BUN 21 07/22/2014 1113   BUN 35.1* 07/26/2013 1020   BUN 16 09/13/2012 1702   CREATININE 1.07 07/22/2014 1113   CREATININE 0.90 08/29/2013 1423   CREATININE 0.9 07/26/2013 1020   CREATININE 0.94 09/13/2012 1702      Component Value Date/Time   CALCIUM 9.6 07/22/2014 1113   CALCIUM 8.7 07/26/2013 1020   CALCIUM 9.5 09/13/2012 1702   ALKPHOS 64 07/22/2014 1113   ALKPHOS 67 07/26/2013 1020   ALKPHOS 93 09/13/2012 1702   AST 21 07/22/2014 1113   AST 15 07/26/2013 1020   AST 29 09/13/2012 1702   ALT 12 07/22/2014 1113   ALT 27 07/26/2013 1020   ALT 26 09/13/2012 1702   BILITOT 0.3 07/22/2014 1113   BILITOT 0.40 07/26/2013 1020   BILITOT 0.3 09/13/2012 1702       Radiological Studies: No results found.  Impression: #1.  TTP in third remission Blood counts have stabilized. Now out 1 1/2 years  from most recent recurrence. I will decrease frequency of blood work to every 3 months Continue azathioprine 100 mg daily.   #2. Tobacco addiction with no incentive to stop #3. Obstructive airway disease secondary to #2  #4. History of epidural abscess requiring emergency drainage and laminectomy with concomitant enterococcal septicemia and endocarditis. 1/12 #5. History of culture-negative sepsis 4/15 #6. Chronic back pain secondary to degenerative arthritis of the spine  Health maintenance: She had Pneumovax last year. I wanted to give her the flu vaccine today but I forgot.  CC: Patient Care Team: Kirk Ruths, MD as  PCP - General (Unknown Physician Specialty)   Annia Belt, MD 10/5/201610:10 AM

## 2015-03-24 ENCOUNTER — Other Ambulatory Visit: Payer: Self-pay

## 2015-03-25 MED ORDER — AZATHIOPRINE 50 MG PO TABS: 100.0000 mg | ORAL_TABLET | Freq: Every day | ORAL | Status: DC

## 2015-04-23 ENCOUNTER — Other Ambulatory Visit (INDEPENDENT_AMBULATORY_CARE_PROVIDER_SITE_OTHER): Payer: Medicare Other

## 2015-04-23 DIAGNOSIS — M311 Thrombotic microangiopathy: Secondary | ICD-10-CM

## 2015-04-23 DIAGNOSIS — M3119 Other thrombotic microangiopathy: Secondary | ICD-10-CM

## 2015-04-24 LAB — COMPREHENSIVE METABOLIC PANEL
ALK PHOS: 58 IU/L (ref 39–117)
ALT: 12 IU/L (ref 0–32)
AST: 18 IU/L (ref 0–40)
Albumin/Globulin Ratio: 2 (ref 1.1–2.5)
Albumin: 4.4 g/dL (ref 3.5–4.8)
BILIRUBIN TOTAL: 0.4 mg/dL (ref 0.0–1.2)
BUN / CREAT RATIO: 20 (ref 11–26)
BUN: 21 mg/dL (ref 8–27)
CHLORIDE: 101 mmol/L (ref 96–106)
CO2: 23 mmol/L (ref 18–29)
CREATININE: 1.07 mg/dL — AB (ref 0.57–1.00)
Calcium: 9.6 mg/dL (ref 8.7–10.3)
GFR calc Af Amer: 60 mL/min/{1.73_m2} (ref >59)
GFR calc non Af Amer: 52 mL/min/{1.73_m2} — ABNORMAL LOW (ref >59)
GLUCOSE: 92 mg/dL (ref 65–99)
Globulin, Total: 2.2 g/dL (ref 1.5–4.5)
Potassium: 4.6 mmol/L (ref 3.5–5.2)
Sodium: 139 mmol/L (ref 134–144)
Total Protein: 6.6 g/dL (ref 6.0–8.5)

## 2015-04-24 LAB — CBC WITH DIFFERENTIAL/PLATELET
BASOS: 1 %
Basophils Absolute: 0 10*3/uL (ref 0.0–0.2)
EOS (ABSOLUTE): 0.2 10*3/uL (ref 0.0–0.4)
Eos: 2 %
HEMATOCRIT: 45.4 % (ref 34.0–46.6)
Hemoglobin: 15.5 g/dL (ref 11.1–15.9)
Immature Grans (Abs): 0 10*3/uL (ref 0.0–0.1)
Immature Granulocytes: 0 %
LYMPHS: 23 %
Lymphocytes Absolute: 1.6 10*3/uL (ref 0.7–3.1)
MCH: 32.1 pg (ref 26.6–33.0)
MCHC: 34.1 g/dL (ref 31.5–35.7)
MCV: 94 fL (ref 79–97)
MONOCYTES: 10 %
Monocytes Absolute: 0.7 10*3/uL (ref 0.1–0.9)
NEUTROS ABS: 4.5 10*3/uL (ref 1.4–7.0)
Neutrophils: 64 %
PLATELETS: 178 10*3/uL (ref 150–379)
RBC: 4.83 x10E6/uL (ref 3.77–5.28)
RDW: 14.3 % (ref 12.3–15.4)
WBC: 7 10*3/uL (ref 3.4–10.8)

## 2015-04-24 LAB — RETICULOCYTES: RETIC CT PCT: 1.3 % (ref 0.6–2.6)

## 2015-04-29 ENCOUNTER — Telehealth: Payer: Self-pay | Admitting: *Deleted

## 2015-04-29 NOTE — Telephone Encounter (Signed)
-----   Message from Annia Belt, MD sent at 04/26/2015 12:21 PM EST ----- Call pt: lab good, platelets 178,000

## 2015-04-29 NOTE — Telephone Encounter (Signed)
Pt called; no answer - left message "labs are good and platelets are 178,000 per Dr Beryle Beams. And to call if she has any questions.

## 2015-08-10 ENCOUNTER — Other Ambulatory Visit (INDEPENDENT_AMBULATORY_CARE_PROVIDER_SITE_OTHER): Payer: Medicare Other

## 2015-08-10 DIAGNOSIS — M3119 Other thrombotic microangiopathy: Secondary | ICD-10-CM

## 2015-08-10 DIAGNOSIS — M311 Thrombotic microangiopathy: Secondary | ICD-10-CM

## 2015-08-11 ENCOUNTER — Other Ambulatory Visit: Payer: Self-pay | Admitting: Oncology

## 2015-08-11 ENCOUNTER — Telehealth: Payer: Self-pay | Admitting: *Deleted

## 2015-08-11 DIAGNOSIS — M3119 Other thrombotic microangiopathy: Secondary | ICD-10-CM

## 2015-08-11 DIAGNOSIS — M311 Thrombotic microangiopathy: Secondary | ICD-10-CM

## 2015-08-11 LAB — COMPREHENSIVE METABOLIC PANEL
A/G RATIO: 1.8 (ref 1.2–2.2)
ALT: 14 IU/L (ref 0–32)
AST: 16 IU/L (ref 0–40)
Albumin: 4.2 g/dL (ref 3.5–4.8)
Alkaline Phosphatase: 65 IU/L (ref 39–117)
BUN/Creatinine Ratio: 17 (ref 12–28)
BUN: 18 mg/dL (ref 8–27)
Bilirubin Total: 0.4 mg/dL (ref 0.0–1.2)
CALCIUM: 9.3 mg/dL (ref 8.7–10.3)
CO2: 23 mmol/L (ref 18–29)
CREATININE: 1.08 mg/dL — AB (ref 0.57–1.00)
Chloride: 99 mmol/L (ref 96–106)
GFR calc Af Amer: 59 mL/min/{1.73_m2} — ABNORMAL LOW (ref >59)
GFR, EST NON AFRICAN AMERICAN: 51 mL/min/{1.73_m2} — AB (ref >59)
GLUCOSE: 98 mg/dL (ref 65–99)
Globulin, Total: 2.4 g/dL (ref 1.5–4.5)
POTASSIUM: 4.9 mmol/L (ref 3.5–5.2)
Sodium: 139 mmol/L (ref 134–144)
TOTAL PROTEIN: 6.6 g/dL (ref 6.0–8.5)

## 2015-08-11 LAB — CBC WITH DIFFERENTIAL/PLATELET
BASOS ABS: 0 10*3/uL (ref 0.0–0.2)
Basos: 1 %
EOS (ABSOLUTE): 0.1 10*3/uL (ref 0.0–0.4)
Eos: 2 %
HEMOGLOBIN: 14.5 g/dL (ref 11.1–15.9)
Hematocrit: 42.1 % (ref 34.0–46.6)
IMMATURE GRANS (ABS): 0 10*3/uL (ref 0.0–0.1)
IMMATURE GRANULOCYTES: 0 %
LYMPHS: 9 %
Lymphocytes Absolute: 0.7 10*3/uL (ref 0.7–3.1)
MCH: 32.7 pg (ref 26.6–33.0)
MCHC: 34.4 g/dL (ref 31.5–35.7)
MCV: 95 fL (ref 79–97)
MONOCYTES: 14 %
Monocytes Absolute: 1 10*3/uL — ABNORMAL HIGH (ref 0.1–0.9)
NEUTROS PCT: 74 %
Neutrophils Absolute: 5.4 10*3/uL (ref 1.4–7.0)
PLATELETS: 138 10*3/uL — AB (ref 150–379)
RBC: 4.44 x10E6/uL (ref 3.77–5.28)
RDW: 14.1 % (ref 12.3–15.4)
WBC: 7.3 10*3/uL (ref 3.4–10.8)

## 2015-08-11 LAB — RETICULOCYTES: RETIC CT PCT: 1.5 % (ref 0.6–2.6)

## 2015-08-11 NOTE — Telephone Encounter (Signed)
-----   Message from Annia Belt, MD sent at 08/11/2015  7:43 AM EDT ----- Call pt: platelet count 138,000 down from 178,000.  We need to repeat when she comes in next week - I will put in order

## 2015-08-11 NOTE — Telephone Encounter (Signed)
Pt called - no answer; left message platelets ae down to 138,00 from 178,000 - need to repeat next week prior to her appt per Dr Beryle Beams. Go to lab after she checks in. Call if she any questions.

## 2015-08-18 ENCOUNTER — Encounter: Payer: Self-pay | Admitting: Oncology

## 2015-08-18 ENCOUNTER — Ambulatory Visit (INDEPENDENT_AMBULATORY_CARE_PROVIDER_SITE_OTHER): Payer: Medicare Other | Admitting: Oncology

## 2015-08-18 VITALS — BP 116/52 | HR 94 | Temp 98.4°F | Ht 64.0 in | Wt 161.1 lb

## 2015-08-18 DIAGNOSIS — J449 Chronic obstructive pulmonary disease, unspecified: Secondary | ICD-10-CM | POA: Diagnosis not present

## 2015-08-18 DIAGNOSIS — Z8619 Personal history of other infectious and parasitic diseases: Secondary | ICD-10-CM

## 2015-08-18 DIAGNOSIS — Z720 Tobacco use: Secondary | ICD-10-CM

## 2015-08-18 DIAGNOSIS — F17210 Nicotine dependence, cigarettes, uncomplicated: Secondary | ICD-10-CM

## 2015-08-18 DIAGNOSIS — M311 Thrombotic microangiopathy: Secondary | ICD-10-CM

## 2015-08-18 DIAGNOSIS — M3119 Other thrombotic microangiopathy: Secondary | ICD-10-CM

## 2015-08-18 LAB — CBC WITH DIFFERENTIAL/PLATELET
Basophils Absolute: 0 10*3/uL (ref 0.0–0.1)
Basophils Relative: 1 %
EOS PCT: 3 %
Eosinophils Absolute: 0.2 10*3/uL (ref 0.0–0.7)
HCT: 45.4 % (ref 36.0–46.0)
Hemoglobin: 15.3 g/dL — ABNORMAL HIGH (ref 12.0–15.0)
LYMPHS ABS: 1.5 10*3/uL (ref 0.7–4.0)
LYMPHS PCT: 26 %
MCH: 32.3 pg (ref 26.0–34.0)
MCHC: 33.7 g/dL (ref 30.0–36.0)
MCV: 96 fL (ref 78.0–100.0)
MONO ABS: 0.5 10*3/uL (ref 0.1–1.0)
MONOS PCT: 8 %
Neutro Abs: 3.6 10*3/uL (ref 1.7–7.7)
Neutrophils Relative %: 62 %
PLATELETS: 152 10*3/uL (ref 150–400)
RBC: 4.73 MIL/uL (ref 3.87–5.11)
RDW: 13.4 % (ref 11.5–15.5)
WBC: 5.8 10*3/uL (ref 4.0–10.5)

## 2015-08-18 LAB — SAVE SMEAR

## 2015-08-18 MED ORDER — AZATHIOPRINE 50 MG PO TABS: 100.0000 mg | ORAL_TABLET | Freq: Every day | ORAL | Status: DC

## 2015-08-18 MED ORDER — NICOTINE 21-14-7 MG/24HR TD KIT: PACK | TRANSDERMAL | Status: DC

## 2015-08-18 MED ORDER — VARENICLINE TARTRATE 0.5 MG PO TABS: 0.5000 mg | ORAL_TABLET | Freq: Two times a day (BID) | ORAL | Status: DC

## 2015-08-18 NOTE — Patient Instructions (Signed)
Continue every 3 month CBC,diff, Cmet, LDH MD visit 6 months

## 2015-08-18 NOTE — Progress Notes (Signed)
Patient ID: [name], female   DOB: 1941-01-08, 75 y.o.   MRN: [id number] Hematology and Oncology Follow Up Visit  DARLEAN HEINICKE [id number] 13-May-1940 75 y.o. 08/18/2015 3:49 PM   Principle Diagnosis: Encounter Diagnoses  Name Primary?  . TTP (thrombotic thrombocytopenic purpura) (HCC) Yes  . Tobacco abuse   Clinical Summary: 75 year old lady with multiply relapsed TTP. Initial diagnosis January 2005. Induced into remission withplasma exchange and steroids then Rituxan consolidation. Second relapse October 2011 treated with plasma exchange, steroids, and Rituxan. Third relapse March 2015. Treated with plasma exchange, steroids, Rituxan, and currently on maintenance immunosuppression with azathioprine 100 mg daily. Last plasma exchange given on 08/29/2013. She had a persistent unexplained elevation of her LDH through June 2015 which eventually came down to normal.   Interim History:   She is doing well. No interim medical problems. I was a little anxious when I looked at the recent blood counts. There was a trend for her platelets to be falling. 195,000 in September, 178,000 in December, 138,000 on April 10. I repeated counts again today and platelets are 152,000. Bilirubin 0.4. LDH not done. She tells me that she had the flulike illness that was circulating in our community about 2 or 3 weeks ago and was pretty sick. This may explain the transient fall in her platelet count. She continues to smoke 2 packs of cigarettes daily. She has a chronic smoker's cough. No exacerbations of chronic obstructive airway disease recently.  Medications: reviewed  Allergies:  Allergies  Allergen Reactions  . Cefuroxime Axetil Other (See Comments)    "jittery"  . Adhesive [Tape] Other (See Comments)    Blisters, band-aide brand   . Other Other (See Comments)    Wool: Reaction is hives [employer] tape: Reaction is blistering   . Sulfa Antibiotics Hives  . Sulfa Drugs  Cross Reactors Other (See Comments)    unknown    Review of Systems: See interim history Remaining ROS negative:   Physical Exam: Blood pressure 116/52, pulse 94, temperature 98.4 F (36.9 C), temperature source Oral, height 5\' 4"  (1.626 m), weight 161 lb 1.6 oz (73.074 kg), SpO2 94 %. Wt Readings from Last 3 Encounters:  08/18/15 161 lb 1.6 oz (73.074 kg)  02/03/15 160 lb (72.576 kg)  07/01/14 167 lb 9.6 oz (76.023 kg)     General appearance: Well nourished Caucasian woman HENNT: Pharynx no erythema, exudate, mass, or ulcer. No thyromegaly or thyroid nodules Lymph nodes: No cervical, supraclavicular, or axillary lymphadenopathy Breasts:  Lungs: Clear to auscultation, resonant to percussion throughout Heart: Regular rhythm, no murmur, no gallop, no rub, no click, no edema Abdomen: Soft, nontender, normal bowel sounds, no mass, no organomegaly Extremities: No edema, no calf tenderness Musculoskeletal: no joint deformities GU:  Vascular: Carotid pulses 2+, no bruits,  Neurologic: Alert, oriented, PERRLA, I could not get a good look at her fundi. , cranial nerves grossly normal, motor strength 5 over 5, reflexes 1+ symmetric, upper body coordination normal, gait normal, Skin: No rash or ecchymosis  Lab Results: CBC W/Diff    Component Value Date/Time   WBC 5.8 08/18/2015 1038   WBC 7.3 08/10/2015 1123   WBC 10.6* 08/02/2013 1008   WBC 7.2 09/13/2012 1702   RBC 4.73 08/18/2015 1038   RBC 4.44 08/10/2015 1123   RBC 4.65 10/22/2014 1114   RBC 3.99 08/02/2013 1008   RBC 4.84 09/13/2012 1702   RBC 4.74 11/09/2007 1100   HGB 15.3* 08/18/2015 1038  HGB 12.6 08/02/2013 1008   HGB 15.5 09/13/2012 1702   HGB 14.4 11/09/2007 1100   HCT 45.4 08/18/2015 1038   HCT 42.1 08/10/2015 1123   HCT 38.8 08/02/2013 1008   HCT 43.7 09/13/2012 1702   HCT 42.6 11/09/2007 1100   PLT 152 08/18/2015 1038   PLT 138* 08/10/2015 1123   PLT 76* 08/02/2013 1008   PLT 141* 09/13/2012 1702   PLT  233 11/09/2007 1100   MCV 96.0 08/18/2015 1038   MCV 95 08/10/2015 1123   MCV 97.2 08/02/2013 1008   MCV 90 09/13/2012 1702   MCV 90 11/09/2007 1100   MCH 32.3 08/18/2015 1038   MCH 32.7 08/10/2015 1123   MCH 31.6 08/02/2013 1008   MCH 32.0 09/13/2012 1702   MCH 30.3 11/09/2007 1100   MCHC 33.7 08/18/2015 1038   MCHC 34.4 08/10/2015 1123   MCHC 32.5 08/02/2013 1008   MCHC 35.4 09/13/2012 1702   MCHC 33.7 11/09/2007 1100   RDW 13.4 08/18/2015 1038   RDW 14.1 08/10/2015 1123   RDW 14.8* 08/02/2013 1008   RDW 13.3 09/13/2012 1702   RDW 12.5 11/09/2007 1100   LYMPHSABS 1.5 08/18/2015 1038   LYMPHSABS 0.7 08/10/2015 1123   LYMPHSABS 0.2* 08/02/2013 1008   LYMPHSABS 3.2 09/13/2012 1702   LYMPHSABS 2.5 11/09/2007 1100   MONOABS 0.5 08/18/2015 1038   MONOABS 0.7 08/02/2013 1008   MONOABS 0.6 09/13/2012 1702   EOSABS 0.2 08/18/2015 1038   EOSABS 0.1 08/10/2015 1123   EOSABS 0.0 08/02/2013 1008   EOSABS 0.3 09/13/2012 1702   EOSABS 0.1 11/09/2007 1100   BASOSABS 0.0 08/18/2015 1038   BASOSABS 0.0 08/10/2015 1123   BASOSABS 0.0 08/02/2013 1008   BASOSABS 0.1 09/13/2012 1702   BASOSABS 0.1 11/09/2007 1100     Chemistry      Component Value Date/Time   NA 139 08/10/2015 1123   NA 139 07/22/2014 1113   NA 140 07/26/2013 1020   NA 140 09/13/2012 1702   K 4.9 08/10/2015 1123   K 4.6 07/26/2013 1020   K 4.0 09/13/2012 1702   CL 99 08/10/2015 1123   CL 108* 09/13/2012 1702   CL 107 05/22/2012 1023   CO2 23 08/10/2015 1123   CO2 22 07/26/2013 1020   CO2 27 09/13/2012 1702   BUN 18 08/10/2015 1123   BUN 21 07/22/2014 1113   BUN 35.1* 07/26/2013 1020   BUN 16 09/13/2012 1702   CREATININE 1.08* 08/10/2015 1123   CREATININE 1.07 07/22/2014 1113   CREATININE 0.9 07/26/2013 1020   CREATININE 0.94 09/13/2012 1702      Component Value Date/Time   CALCIUM 9.3 08/10/2015 1123   CALCIUM 8.7 07/26/2013 1020   CALCIUM 9.5 09/13/2012 1702   ALKPHOS 65 08/10/2015 1123   ALKPHOS 67  07/26/2013 1020   ALKPHOS 93 09/13/2012 1702   AST 16 08/10/2015 1123   AST 15 07/26/2013 1020   AST 29 09/13/2012 1702   ALT 14 08/10/2015 1123   ALT 27 07/26/2013 1020   ALT 26 09/13/2012 1702   BILITOT 0.4 08/10/2015 1123   BILITOT 0.3 07/22/2014 1113   BILITOT 0.40 07/26/2013 1020   BILITOT 0.3 09/13/2012 1702       Radiological Studies: No results found.  Impression:  #1. TTP in third remission Blood counts have normalized. Now out 2 years from most recent recurrence. Recent transient dip in platelet count likely secondary to viral infection. I will continue to monitor blood work  every  3 months Continue azathioprine 100 mg daily.   #2. Tobacco addiction:  I tried again today and she finally agreed to try smoking cessation again. I'm prescribing Chantix 0.5 mg twice a day as well as a Transderm nicotine patch. #3. Obstructive airway disease secondary to #2  #4. History of epidural abscess requiring emergency drainage and laminectomy with concomitant enterococcal septicemia and endocarditis. 1/12 #5. History of culture-negative sepsis 4/15 #6. Chronic back pain secondary to degenerative arthritis of the spine  CC: Patient Care Team: Kirk Ruths, MD as PCP - General (Unknown Physician Specialty)   Annia Belt, MD 4/18/20173:49 PM

## 2015-11-02 ENCOUNTER — Other Ambulatory Visit (INDEPENDENT_AMBULATORY_CARE_PROVIDER_SITE_OTHER): Payer: Medicare Other

## 2015-11-02 DIAGNOSIS — M3119 Other thrombotic microangiopathy: Secondary | ICD-10-CM

## 2015-11-02 DIAGNOSIS — M311 Thrombotic microangiopathy: Secondary | ICD-10-CM | POA: Diagnosis not present

## 2015-11-03 LAB — CBC WITH DIFFERENTIAL/PLATELET
Basophils Absolute: 0 10*3/uL (ref 0.0–0.2)
Basos: 1 %
EOS (ABSOLUTE): 0.3 10*3/uL (ref 0.0–0.4)
EOS: 7 %
HEMATOCRIT: 45 % (ref 34.0–46.6)
Hemoglobin: 15.6 g/dL (ref 11.1–15.9)
Immature Grans (Abs): 0 10*3/uL (ref 0.0–0.1)
Immature Granulocytes: 1 %
LYMPHS ABS: 1.4 10*3/uL (ref 0.7–3.1)
Lymphs: 33 %
MCH: 32.4 pg (ref 26.6–33.0)
MCHC: 34.7 g/dL (ref 31.5–35.7)
MCV: 93 fL (ref 79–97)
MONOS ABS: 0.4 10*3/uL (ref 0.1–0.9)
Monocytes: 8 %
NEUTROS ABS: 2.1 10*3/uL (ref 1.4–7.0)
Neutrophils: 50 %
Platelets: 142 10*3/uL — ABNORMAL LOW (ref 150–379)
RBC: 4.82 x10E6/uL (ref 3.77–5.28)
RDW: 14.5 % (ref 12.3–15.4)
WBC: 4.2 10*3/uL (ref 3.4–10.8)

## 2015-11-03 LAB — COMPREHENSIVE METABOLIC PANEL
ALBUMIN: 4.2 g/dL (ref 3.5–4.8)
ALK PHOS: 61 IU/L (ref 39–117)
ALT: 12 IU/L (ref 0–32)
AST: 17 IU/L (ref 0–40)
Albumin/Globulin Ratio: 1.9 (ref 1.2–2.2)
BILIRUBIN TOTAL: 0.3 mg/dL (ref 0.0–1.2)
BUN / CREAT RATIO: 17 (ref 12–28)
BUN: 18 mg/dL (ref 8–27)
CHLORIDE: 104 mmol/L (ref 96–106)
CO2: 23 mmol/L (ref 18–29)
Calcium: 9.6 mg/dL (ref 8.7–10.3)
Creatinine, Ser: 1.07 mg/dL — ABNORMAL HIGH (ref 0.57–1.00)
GFR calc Af Amer: 60 mL/min/{1.73_m2} (ref >59)
GFR calc non Af Amer: 52 mL/min/{1.73_m2} — ABNORMAL LOW (ref >59)
GLUCOSE: 117 mg/dL — AB (ref 65–99)
Globulin, Total: 2.2 g/dL (ref 1.5–4.5)
Potassium: 5 mmol/L (ref 3.5–5.2)
SODIUM: 145 mmol/L — AB (ref 134–144)
Total Protein: 6.4 g/dL (ref 6.0–8.5)

## 2015-11-03 LAB — LACTATE DEHYDROGENASE: LDH: 197 IU/L (ref 119–226)

## 2015-11-03 LAB — RETICULOCYTES: Retic Ct Pct: 1.8 % (ref 0.6–2.6)

## 2015-11-11 ENCOUNTER — Telehealth: Payer: Self-pay | Admitting: *Deleted

## 2015-11-11 NOTE — Telephone Encounter (Signed)
-----   Message from Annia Belt, MD sent at 11/04/2015  9:26 AM EDT ----- Call pt: platelet count OK @ 142,000

## 2015-11-11 NOTE — Telephone Encounter (Signed)
Pt called / informed platelet count ok @ 142,000 per Dr Beryle Beams.

## 2016-02-01 ENCOUNTER — Other Ambulatory Visit: Payer: Medicare Other

## 2016-02-02 ENCOUNTER — Other Ambulatory Visit (INDEPENDENT_AMBULATORY_CARE_PROVIDER_SITE_OTHER): Payer: Medicare Other

## 2016-02-02 DIAGNOSIS — M311 Thrombotic microangiopathy: Secondary | ICD-10-CM

## 2016-02-02 DIAGNOSIS — M3119 Other thrombotic microangiopathy: Secondary | ICD-10-CM

## 2016-02-03 LAB — COMPREHENSIVE METABOLIC PANEL
A/G RATIO: 2 (ref 1.2–2.2)
ALBUMIN: 4.3 g/dL (ref 3.5–4.8)
ALK PHOS: 56 IU/L (ref 39–117)
ALT: 13 IU/L (ref 0–32)
AST: 17 IU/L (ref 0–40)
BILIRUBIN TOTAL: 0.4 mg/dL (ref 0.0–1.2)
BUN / CREAT RATIO: 17 (ref 12–28)
BUN: 18 mg/dL (ref 8–27)
CHLORIDE: 103 mmol/L (ref 96–106)
CO2: 21 mmol/L (ref 18–29)
Calcium: 9.8 mg/dL (ref 8.7–10.3)
Creatinine, Ser: 1.08 mg/dL — ABNORMAL HIGH (ref 0.57–1.00)
GFR calc Af Amer: 59 mL/min/{1.73_m2} — ABNORMAL LOW (ref >59)
GFR calc non Af Amer: 51 mL/min/{1.73_m2} — ABNORMAL LOW (ref >59)
GLUCOSE: 106 mg/dL — AB (ref 65–99)
Globulin, Total: 2.2 g/dL (ref 1.5–4.5)
POTASSIUM: 4.7 mmol/L (ref 3.5–5.2)
Sodium: 144 mmol/L (ref 134–144)
Total Protein: 6.5 g/dL (ref 6.0–8.5)

## 2016-02-03 LAB — CBC WITH DIFFERENTIAL/PLATELET
BASOS ABS: 0.1 10*3/uL (ref 0.0–0.2)
Basos: 1 %
EOS (ABSOLUTE): 0.2 10*3/uL (ref 0.0–0.4)
Eos: 4 %
Hematocrit: 42.7 % (ref 34.0–46.6)
Hemoglobin: 14.8 g/dL (ref 11.1–15.9)
Immature Grans (Abs): 0 10*3/uL (ref 0.0–0.1)
Immature Granulocytes: 0 %
LYMPHS ABS: 1.5 10*3/uL (ref 0.7–3.1)
Lymphs: 29 %
MCH: 32 pg (ref 26.6–33.0)
MCHC: 34.7 g/dL (ref 31.5–35.7)
MCV: 92 fL (ref 79–97)
MONOS ABS: 0.4 10*3/uL (ref 0.1–0.9)
Monocytes: 7 %
NEUTROS ABS: 3.1 10*3/uL (ref 1.4–7.0)
Neutrophils: 59 %
Platelets: 147 10*3/uL — ABNORMAL LOW (ref 150–379)
RBC: 4.63 x10E6/uL (ref 3.77–5.28)
RDW: 13.6 % (ref 12.3–15.4)
WBC: 5.3 10*3/uL (ref 3.4–10.8)

## 2016-02-03 LAB — LACTATE DEHYDROGENASE: LDH: 216 IU/L (ref 119–226)

## 2016-02-03 LAB — RETICULOCYTES: RETIC CT PCT: 1.7 % (ref 0.6–2.6)

## 2016-02-04 ENCOUNTER — Telehealth: Payer: Self-pay | Admitting: *Deleted

## 2016-02-04 NOTE — Telephone Encounter (Signed)
Pt called / informed "platelets holding at 147,000; everything else is good" per Dr Beryle Beams. Pt stated wonderful.

## 2016-02-04 NOTE — Telephone Encounter (Signed)
-----   Message from Annia Belt, MD sent at 02/03/2016  9:57 AM EDT ----- Call pt: platelets holding at 147,000; everything else is good

## 2016-02-16 ENCOUNTER — Encounter: Payer: Self-pay | Admitting: Oncology

## 2016-02-16 ENCOUNTER — Ambulatory Visit (INDEPENDENT_AMBULATORY_CARE_PROVIDER_SITE_OTHER): Payer: Medicare Other | Admitting: Oncology

## 2016-02-16 VITALS — BP 125/46 | HR 88 | Temp 97.8°F | Ht 64.0 in | Wt 176.3 lb

## 2016-02-16 DIAGNOSIS — Z8619 Personal history of other infectious and parasitic diseases: Secondary | ICD-10-CM | POA: Diagnosis not present

## 2016-02-16 DIAGNOSIS — M311 Thrombotic microangiopathy: Secondary | ICD-10-CM | POA: Diagnosis not present

## 2016-02-16 DIAGNOSIS — F17211 Nicotine dependence, cigarettes, in remission: Secondary | ICD-10-CM

## 2016-02-16 DIAGNOSIS — J449 Chronic obstructive pulmonary disease, unspecified: Secondary | ICD-10-CM

## 2016-02-16 DIAGNOSIS — M3119 Other thrombotic microangiopathy: Secondary | ICD-10-CM

## 2016-02-16 DIAGNOSIS — Z79899 Other long term (current) drug therapy: Secondary | ICD-10-CM

## 2016-02-16 NOTE — Patient Instructions (Signed)
Continue lab every 3 months next 05/04/16 MD visit 6 months

## 2016-02-18 NOTE — Progress Notes (Signed)
Hematology and Oncology Follow Up Visit  [name] [id number] 1941-03-08 75 y.o. 02/18/2016 11:00 AM   Principle Diagnosis: Encounter Diagnosis  Name Primary?  . 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 Initial diagnosis January 2005. Induced into remission withplasma exchange and steroids then Rituxan consolidation. Second relapse October 2011 treated with plasma exchange, steroids, and Rituxan. Third relapse March 2015. Treated with plasma exchange, steroids, Rituxan, and currently on maintenance immunosuppression with azathioprine 100 mg daily. Last plasma exchange given on 08/29/2013. She had a persistent unexplained elevation of her LDH through June 2015 which eventually came down to normal.  Interim History:   We are now out almost 13 years from her initial presentation with neurologic signs and symptoms and hematologic abnormalities consistent with TTP. At time of her third relapse, I added chronic immunosuppressive therapy with azathioprine and we are now out 2-1/2 years with no signs of further relapse. I was pleasantly surprised when I asked her if she was still smoking. She is finally making an effort to quit. She has a electronic cigarette and is slowly tapering the nicotine concentration down. She is breathing better. She is no longer coughing. Not wheezing. I congratulated her. She has had no new neurologic signs or symptoms and denies any new headache, change in vision, slurred speech, focal weakness, or paresthesias. No cough, dyspnea, ischemic quality chest pain or palpitations. No change in bowel habit. Her grandson is now 22 years old and wide open.  Medications: reviewed  Allergies:  Allergies  Allergen Reactions  . Cefuroxime Axetil Other (See Comments)    "jittery"  . Adhesive [Tape] Other (See Comments)    Blisters, band-aide brand   . Other Other (See Comments)    Wool:  Reaction is hives [employer] tape: Reaction is blistering   . Sulfa Antibiotics Hives  . Sulfa Drugs Cross Reactors Other (See Comments)    unknown    Review of Systems: See interim history Remaining ROS negative:   Physical Exam: Blood pressure (!) 125/46, pulse 88, temperature 97.8 F (36.6 C), temperature source Oral, height 5\' 4"  (1.626 m), weight 176 lb 4.8 oz (80 kg), SpO2 96 %. Wt Readings from Last 3 Encounters:  02/16/16 176 lb 4.8 oz (80 kg)  08/18/15 161 lb 1.6 oz (73.1 kg)  02/03/15 160 lb (72.6 kg)     General appearance: Well-nourished Caucasian woman HENNT: Pharynx no erythema, exudate, mass, or ulcer. No thyromegaly or thyroid nodules Lymph nodes: No cervical, supraclavicular, or axillary lymphadenopathy Breasts:  Lungs: Clear to auscultation, resonant to percussion throughout Heart: Regular rhythm, no murmur, no gallop, no rub, no click, no edema Abdomen: Soft, nontender, normal bowel sounds, no mass, no organomegaly Extremities: No edema, no calf tenderness Musculoskeletal: no joint deformities GU:  Vascular: Carotid pulses 2+, no bruits,  Neurologic: Alert, oriented, PERRLA, optic discs sharp and vessels normal, no hemorrhage or exudate, cranial nerves grossly normal, motor strength 5 over 5, reflexes 1+ symmetric, upper body coordination normal, gait normal, Skin: No rash or ecchymosis  Lab Results: CBC W/Diff    Component Value Date/Time   WBC 5.3 02/02/2016 1044   WBC 5.8 08/18/2015 1038   RBC 4.63 02/02/2016 1044   RBC 4.73 08/18/2015 1038   HGB 15.3 (H) 08/18/2015 1038   HGB 12.6 08/02/2013 1008   HCT 42.7 02/02/2016 1044   HCT 38.8 08/02/2013 1008   PLT 147 (L) 02/02/2016 1044   MCV 92 02/02/2016  1044   MCV 97.2 08/02/2013 1008   MCH 32.0 02/02/2016 1044   MCH 32.3 08/18/2015 1038   MCHC 34.7 02/02/2016 1044   MCHC 33.7 08/18/2015 1038   RDW 13.6 02/02/2016 1044   RDW 14.8 (H) 08/02/2013 1008   LYMPHSABS 1.5 02/02/2016  1044   LYMPHSABS 0.2 (L) 08/02/2013 1008   MONOABS 0.5 08/18/2015 1038   MONOABS 0.7 08/02/2013 1008   EOSABS 0.2 02/02/2016 1044   EOSABS 0.3 09/13/2012 1702   EOSABS 0.1 11/09/2007 1100   BASOSABS 0.1 02/02/2016 1044   BASOSABS 0.0 08/02/2013 1008     Chemistry      Component Value Date/Time   NA 144 02/02/2016 1044   NA 140 07/26/2013 1020   K 4.7 02/02/2016 1044   K 4.6 07/26/2013 1020   CL 103 02/02/2016 1044   CL 108 (H) 09/13/2012 1702   CL 107 05/22/2012 1023   CO2 21 02/02/2016 1044   CO2 22 07/26/2013 1020   BUN 18 02/02/2016 1044   BUN 35.1 (H) 07/26/2013 1020   CREATININE 1.08 (H) 02/02/2016 1044   CREATININE 1.07 07/22/2014 1113   CREATININE 0.9 07/26/2013 1020      Component Value Date/Time   CALCIUM 9.8 02/02/2016 1044   CALCIUM 8.7 07/26/2013 1020   ALKPHOS 56 02/02/2016 1044   ALKPHOS 67 07/26/2013 1020   AST 17 02/02/2016 1044   AST 15 07/26/2013 1020   ALT 13 02/02/2016 1044   ALT 27 07/26/2013 1020   BILITOT 0.4 02/02/2016 1044   BILITOT 0.40 07/26/2013 1020    LDH: 216 lab normal up to 226   Radiological Studies: No results found.  Impression:  #1. 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   #2. Tobacco addiction: She has switched to an electronic cigarette and is tapering the nicotine concentration.  #3. Obstructive airway disease secondary to #2  Already feeling better and less respiratory symptoms off tobacco.  #4. History of epidural abscess requiring emergency drainage and laminectomy with concomitant enterococcal septicemia and endocarditis. 1/12  #5. History of culture-negative sepsis 4/15  #6. Chronic back pain secondary to degenerative arthritis of the spine  CC: Patient Care Team: Kirk Ruths, MD as PCP - General (Unknown Physician Specialty)   Annia Belt, MD 10/19/201711:00 AM

## 2016-04-07 ENCOUNTER — Other Ambulatory Visit: Payer: Self-pay | Admitting: *Deleted

## 2016-04-07 MED ORDER — AZATHIOPRINE 50 MG PO TABS: 100.0000 mg | ORAL_TABLET | Freq: Every day | ORAL | 6 refills | Status: DC

## 2016-04-07 NOTE — Telephone Encounter (Signed)
Refill phoned into pharmacy but, insurance now requires a PA. Will contact insurance later today to obtain one.Despina Hidden Cassady12/7/201711:59 AM

## 2016-04-07 NOTE — Telephone Encounter (Signed)
[name] - pop up says pt does not have a pharmacy. Can you check this out?  Maybe her local pharmacy doesn't have this med. DrG

## 2016-04-07 NOTE — Telephone Encounter (Signed)
.  Received fax from pt's pharmacy with the following message "This prescription was filled today (04/05/2016).  Any refills authorized will be placed on file." Will send to MD for review and additional refills if appropriate. Regenia Skeeter, Darlene Cassady12/7/20179:27 AM

## 2016-04-08 ENCOUNTER — Other Ambulatory Visit: Payer: Self-pay | Admitting: Oncology

## 2016-04-08 NOTE — Telephone Encounter (Signed)
   Pt calling to check on her Medication refill azaTHIOprine (IMURAN) 50 MG tablet.  Patient states she is now completely out.

## 2016-04-11 NOTE — Telephone Encounter (Signed)
I have contacted both pt's insurance and pharmacy regarding a PA.  Pt's medication was "denied" by her insurance on 04/08/2016.  I am awaiting paperwork to process an appeal.

## 2016-04-21 NOTE — Telephone Encounter (Signed)
Follow up call made to pt-no answer-message left on recorder.  I spoke with pt's dtr last week and she stated their was a problem with how the rx was being processed through [employer], but thinks the problem was resolved.  Call made to pt to see if she needed any further assistance, phone call complete.Despina Hidden Cassady12/21/20174:08 PM

## 2016-05-03 ENCOUNTER — Other Ambulatory Visit (INDEPENDENT_AMBULATORY_CARE_PROVIDER_SITE_OTHER): Payer: Medicare Other

## 2016-05-03 DIAGNOSIS — M311 Thrombotic microangiopathy: Secondary | ICD-10-CM

## 2016-05-03 DIAGNOSIS — M3119 Other thrombotic microangiopathy: Secondary | ICD-10-CM

## 2016-05-04 ENCOUNTER — Other Ambulatory Visit: Payer: Medicare Other

## 2016-05-04 ENCOUNTER — Other Ambulatory Visit: Payer: Self-pay | Admitting: Oncology

## 2016-05-04 DIAGNOSIS — M3119 Other thrombotic microangiopathy: Secondary | ICD-10-CM

## 2016-05-04 DIAGNOSIS — M311 Thrombotic microangiopathy: Secondary | ICD-10-CM

## 2016-05-04 DIAGNOSIS — Z862 Personal history of diseases of the blood and blood-forming organs and certain disorders involving the immune mechanism: Secondary | ICD-10-CM

## 2016-05-04 LAB — CBC WITH DIFFERENTIAL/PLATELET
BASOS ABS: 0.1 10*3/uL (ref 0.0–0.2)
BASOS: 1 %
EOS (ABSOLUTE): 0.1 10*3/uL (ref 0.0–0.4)
Eos: 3 %
Hematocrit: 43 % (ref 34.0–46.6)
Hemoglobin: 14.7 g/dL (ref 11.1–15.9)
Immature Grans (Abs): 0 10*3/uL (ref 0.0–0.1)
Immature Granulocytes: 0 %
Lymphocytes Absolute: 1.1 10*3/uL (ref 0.7–3.1)
Lymphs: 20 %
MCH: 31.7 pg (ref 26.6–33.0)
MCHC: 34.2 g/dL (ref 31.5–35.7)
MCV: 93 fL (ref 79–97)
MONOS ABS: 0.8 10*3/uL (ref 0.1–0.9)
Monocytes: 14 %
Neutrophils Absolute: 3.4 10*3/uL (ref 1.4–7.0)
Neutrophils: 62 %
PLATELETS: 132 10*3/uL — AB (ref 150–379)
RBC: 4.64 x10E6/uL (ref 3.77–5.28)
RDW: 13.9 % (ref 12.3–15.4)
WBC: 5.4 10*3/uL (ref 3.4–10.8)

## 2016-05-04 LAB — COMPREHENSIVE METABOLIC PANEL
A/G RATIO: 2 (ref 1.2–2.2)
ALT: 15 IU/L (ref 0–32)
AST: 20 IU/L (ref 0–40)
Albumin: 4.4 g/dL (ref 3.5–4.8)
Alkaline Phosphatase: 59 IU/L (ref 39–117)
BILIRUBIN TOTAL: 0.4 mg/dL (ref 0.0–1.2)
BUN / CREAT RATIO: 22 (ref 12–28)
BUN: 24 mg/dL (ref 8–27)
CALCIUM: 9.7 mg/dL (ref 8.7–10.3)
CHLORIDE: 102 mmol/L (ref 96–106)
CO2: 22 mmol/L (ref 18–29)
Creatinine, Ser: 1.08 mg/dL — ABNORMAL HIGH (ref 0.57–1.00)
GFR, EST AFRICAN AMERICAN: 59 mL/min/{1.73_m2} — AB (ref >59)
GFR, EST NON AFRICAN AMERICAN: 51 mL/min/{1.73_m2} — AB (ref >59)
GLOBULIN, TOTAL: 2.2 g/dL (ref 1.5–4.5)
Glucose: 103 mg/dL — ABNORMAL HIGH (ref 65–99)
POTASSIUM: 4.7 mmol/L (ref 3.5–5.2)
Sodium: 142 mmol/L (ref 134–144)
TOTAL PROTEIN: 6.6 g/dL (ref 6.0–8.5)

## 2016-05-04 LAB — LACTATE DEHYDROGENASE: LDH: 210 IU/L (ref 119–226)

## 2016-05-04 LAB — RETICULOCYTES: RETIC CT PCT: 1.6 % (ref 0.6–2.6)

## 2016-05-05 ENCOUNTER — Telehealth: Payer: Self-pay | Admitting: *Deleted

## 2016-05-05 NOTE — Telephone Encounter (Signed)
-----   Message from Annia Belt, MD sent at 05/04/2016 11:40 AM EST ----- Call pt: platelet count down from 147,0000 to 132,000; all other labs are OK but we should repeat CBC in 2 weeks just to be careful

## 2016-05-05 NOTE — Telephone Encounter (Signed)
Called pt - no answer; unable to left message, no voice mail has been set up.

## 2016-05-05 NOTE — Telephone Encounter (Signed)
Called pt again - no answer; able to leave message. Left message "platelet count down from 147,0000 to 132,000; all other labs are OK but we should repeat CBC in 2 weeks just to be careful " per Dr Beryle Beams. And to call back to schedule the lab appt.

## 2016-05-06 NOTE — Telephone Encounter (Signed)
Pt has a lab appt on 05/17/16.

## 2016-05-13 ENCOUNTER — Telehealth: Payer: Self-pay | Admitting: Internal Medicine

## 2016-05-13 NOTE — Telephone Encounter (Signed)
APT. REMINDER CALL, LMTCB °

## 2016-05-17 ENCOUNTER — Other Ambulatory Visit (INDEPENDENT_AMBULATORY_CARE_PROVIDER_SITE_OTHER): Payer: Medicare Other

## 2016-05-17 DIAGNOSIS — Z862 Personal history of diseases of the blood and blood-forming organs and certain disorders involving the immune mechanism: Secondary | ICD-10-CM

## 2016-05-17 DIAGNOSIS — M311 Thrombotic microangiopathy: Secondary | ICD-10-CM

## 2016-05-17 DIAGNOSIS — M3119 Other thrombotic microangiopathy: Secondary | ICD-10-CM

## 2016-05-17 LAB — SAVE SMEAR

## 2016-05-17 LAB — RETICULOCYTES
RBC.: 4.47 MIL/uL (ref 3.87–5.11)
RETIC COUNT ABSOLUTE: 80.5 10*3/uL (ref 19.0–186.0)
Retic Ct Pct: 1.8 % (ref 0.4–3.1)

## 2016-05-17 LAB — CBC WITH DIFFERENTIAL/PLATELET
BASOS ABS: 0 10*3/uL (ref 0.0–0.1)
Basophils Relative: 1 %
EOS PCT: 4 %
Eosinophils Absolute: 0.2 10*3/uL (ref 0.0–0.7)
HCT: 42 % (ref 36.0–46.0)
Hemoglobin: 14.1 g/dL (ref 12.0–15.0)
LYMPHS PCT: 38 %
Lymphs Abs: 1.6 10*3/uL (ref 0.7–4.0)
MCH: 31.5 pg (ref 26.0–34.0)
MCHC: 33.6 g/dL (ref 30.0–36.0)
MCV: 94 fL (ref 78.0–100.0)
MONO ABS: 0.5 10*3/uL (ref 0.1–1.0)
MONOS PCT: 12 %
Neutro Abs: 1.9 10*3/uL (ref 1.7–7.7)
Neutrophils Relative %: 45 %
PLATELETS: 136 10*3/uL — AB (ref 150–400)
RBC: 4.47 MIL/uL (ref 3.87–5.11)
RDW: 13.6 % (ref 11.5–15.5)
WBC: 4.1 10*3/uL (ref 4.0–10.5)

## 2016-05-20 ENCOUNTER — Telehealth: Payer: Self-pay | Admitting: *Deleted

## 2016-05-20 NOTE — Telephone Encounter (Signed)
-----   Message from Krystal Belt, MD sent at 05/17/2016  6:22 PM EST ----- Call pt: platelets holding at 136,000. Let's repeat in 1 month

## 2016-05-20 NOTE — Telephone Encounter (Signed)
Pt called / informed "platelets holding at 136,000. Let's repeat in 1 month" per Dr Beryle Beams. Stated she had a virus /taking medication prior to last blood work, thinks this could affect results. Lab appt scheduled for Feb 20 @ 1100 AM.

## 2016-06-21 ENCOUNTER — Other Ambulatory Visit (INDEPENDENT_AMBULATORY_CARE_PROVIDER_SITE_OTHER): Payer: Medicare Other

## 2016-06-21 DIAGNOSIS — M3119 Other thrombotic microangiopathy: Secondary | ICD-10-CM

## 2016-06-21 DIAGNOSIS — M311 Thrombotic microangiopathy: Secondary | ICD-10-CM | POA: Diagnosis not present

## 2016-06-22 LAB — LACTATE DEHYDROGENASE: LDH: 216 IU/L (ref 119–226)

## 2016-06-22 LAB — COMPREHENSIVE METABOLIC PANEL
A/G RATIO: 2 (ref 1.2–2.2)
ALBUMIN: 4.3 g/dL (ref 3.5–4.8)
ALK PHOS: 51 IU/L (ref 39–117)
ALT: 14 IU/L (ref 0–32)
AST: 19 IU/L (ref 0–40)
BILIRUBIN TOTAL: 0.5 mg/dL (ref 0.0–1.2)
BUN / CREAT RATIO: 11 — AB (ref 12–28)
BUN: 13 mg/dL (ref 8–27)
CHLORIDE: 103 mmol/L (ref 96–106)
CO2: 20 mmol/L (ref 18–29)
Calcium: 9.5 mg/dL (ref 8.7–10.3)
Creatinine, Ser: 1.16 mg/dL — ABNORMAL HIGH (ref 0.57–1.00)
GFR calc Af Amer: 54 — ABNORMAL LOW (ref >59)
GFR calc non Af Amer: 46 — ABNORMAL LOW (ref >59)
GLOBULIN, TOTAL: 2.2 (ref 1.5–4.5)
Glucose: 111 mg/dL — ABNORMAL HIGH (ref 65–99)
POTASSIUM: 4.6 mmol/L (ref 3.5–5.2)
SODIUM: 143 mmol/L (ref 134–144)
Total Protein: 6.5 g/dL (ref 6.0–8.5)

## 2016-06-22 LAB — CBC WITH DIFFERENTIAL/PLATELET
Basophils Absolute: 0.1 10*3/uL (ref 0.0–0.2)
Basos: 1 %
EOS (ABSOLUTE): 0.2 10*3/uL (ref 0.0–0.4)
Eos: 5 %
HEMATOCRIT: 42.1 % (ref 34.0–46.6)
Hemoglobin: 14.4 g/dL (ref 11.1–15.9)
Immature Grans (Abs): 0 10*3/uL (ref 0.0–0.1)
Immature Granulocytes: 0 %
LYMPHS ABS: 1.3 10*3/uL (ref 0.7–3.1)
Lymphs: 28 %
MCH: 32 pg (ref 26.6–33.0)
MCHC: 34.2 g/dL (ref 31.5–35.7)
MCV: 94 fL (ref 79–97)
MONOS ABS: 0.5 10*3/uL (ref 0.1–0.9)
Monocytes: 10 %
Neutrophils Absolute: 2.6 10*3/uL (ref 1.4–7.0)
Neutrophils: 56 %
Platelets: 128 10*3/uL — ABNORMAL LOW (ref 150–379)
RBC: 4.5 x10E6/uL (ref 3.77–5.28)
RDW: 13.9 % (ref 12.3–15.4)
WBC: 4.7 10*3/uL (ref 3.4–10.8)

## 2016-06-22 LAB — RETICULOCYTES: Retic Ct Pct: 1.7 % (ref 0.6–2.6)

## 2016-06-23 ENCOUNTER — Telehealth: Payer: Self-pay | Admitting: *Deleted

## 2016-06-23 NOTE — Telephone Encounter (Signed)
-----   Message from Annia Belt, MD sent at 06/22/2016 11:34 AM EST ----- Call pt: platelets down slightly from 136,000 to 128,000. Other lab normal. Repeat CBC in one month. If stable, we can then space out lab to every 3 months

## 2016-06-23 NOTE — Telephone Encounter (Signed)
Called pt - no answer; left message "platelets down slightly from 136,000 to 128,000. Other lab normal. Repeat CBC in one month. If stable, we can then space out lab to every 3 months" per Dr Beryle Beams. Has lab appt already scheduled on 4/3; but left message to call if need to re-schedule sooner.

## 2016-08-01 ENCOUNTER — Telehealth: Payer: Self-pay | Admitting: Internal Medicine

## 2016-08-01 NOTE — Telephone Encounter (Signed)
APT. REMINDER CALL, LMTCB °

## 2016-08-02 ENCOUNTER — Other Ambulatory Visit (INDEPENDENT_AMBULATORY_CARE_PROVIDER_SITE_OTHER): Payer: Medicare Other

## 2016-08-02 DIAGNOSIS — M3119 Other thrombotic microangiopathy: Secondary | ICD-10-CM

## 2016-08-02 DIAGNOSIS — M311 Thrombotic microangiopathy: Secondary | ICD-10-CM | POA: Diagnosis not present

## 2016-08-03 ENCOUNTER — Other Ambulatory Visit: Payer: Medicare Other

## 2016-08-03 LAB — CBC WITH DIFFERENTIAL/PLATELET
BASOS ABS: 0.1 10*3/uL (ref 0.0–0.2)
Basos: 1 %
EOS (ABSOLUTE): 0.2 10*3/uL (ref 0.0–0.4)
Eos: 4 %
Hematocrit: 42.5 % (ref 34.0–46.6)
Hemoglobin: 14.7 g/dL (ref 11.1–15.9)
IMMATURE GRANULOCYTES: 0 %
Immature Grans (Abs): 0 10*3/uL (ref 0.0–0.1)
Lymphocytes Absolute: 1.6 10*3/uL (ref 0.7–3.1)
Lymphs: 33 %
MCH: 32.2 pg (ref 26.6–33.0)
MCHC: 34.6 g/dL (ref 31.5–35.7)
MCV: 93 fL (ref 79–97)
MONOS ABS: 0.4 10*3/uL (ref 0.1–0.9)
Monocytes: 9 %
NEUTROS PCT: 53 %
Neutrophils Absolute: 2.6 10*3/uL (ref 1.4–7.0)
PLATELETS: 125 10*3/uL — AB (ref 150–379)
RBC: 4.57 x10E6/uL (ref 3.77–5.28)
RDW: 14 % (ref 12.3–15.4)
WBC: 4.9 10*3/uL (ref 3.4–10.8)

## 2016-08-03 LAB — COMPREHENSIVE METABOLIC PANEL
ALT: 14 IU/L (ref 0–32)
AST: 17 IU/L (ref 0–40)
Albumin/Globulin Ratio: 2.1 (ref 1.2–2.2)
Albumin: 4.5 g/dL (ref 3.5–4.8)
Alkaline Phosphatase: 56 IU/L (ref 39–117)
BUN / CREAT RATIO: 17 (ref 12–28)
BUN: 19 mg/dL (ref 8–27)
Bilirubin Total: 0.3 mg/dL (ref 0.0–1.2)
CO2: 21 mmol/L (ref 18–29)
CREATININE: 1.12 mg/dL — AB (ref 0.57–1.00)
Calcium: 9.8 mg/dL (ref 8.7–10.3)
Chloride: 104 mmol/L (ref 96–106)
GFR calc Af Amer: 56 mL/min/{1.73_m2} — ABNORMAL LOW (ref >59)
GFR, EST NON AFRICAN AMERICAN: 49 mL/min/{1.73_m2} — AB (ref >59)
GLUCOSE: 111 mg/dL — AB (ref 65–99)
Globulin, Total: 2.1 g/dL (ref 1.5–4.5)
Potassium: 4.6 mmol/L (ref 3.5–5.2)
SODIUM: 144 mmol/L (ref 134–144)
Total Protein: 6.6 g/dL (ref 6.0–8.5)

## 2016-08-03 LAB — LACTATE DEHYDROGENASE: LDH: 214 IU/L (ref 119–226)

## 2016-08-03 LAB — RETICULOCYTES: RETIC CT PCT: 1.2 % (ref 0.6–2.6)

## 2016-08-04 ENCOUNTER — Telehealth: Payer: Self-pay | Admitting: *Deleted

## 2016-08-04 NOTE — Telephone Encounter (Signed)
-----   Message from Annia Belt, MD sent at 08/03/2016  4:54 PM EDT ----- Call pt: platelet about same as last time at 125,000. Will need to check again in one month

## 2016-08-04 NOTE — Telephone Encounter (Signed)
Called pt - no answer; left message ": platelet about same as last time at 125,000. Will need to check again in one month" per Dr Beryle Beams. And will see her at her appt this month and call for any questions.

## 2016-08-15 ENCOUNTER — Encounter: Payer: Self-pay | Admitting: Oncology

## 2016-08-15 ENCOUNTER — Ambulatory Visit (INDEPENDENT_AMBULATORY_CARE_PROVIDER_SITE_OTHER): Payer: Medicare Other | Admitting: Oncology

## 2016-08-15 VITALS — BP 128/55 | HR 86 | Temp 98.4°F | Ht 64.0 in | Wt 173.6 lb

## 2016-08-15 DIAGNOSIS — Z882 Allergy status to sulfonamides status: Secondary | ICD-10-CM

## 2016-08-15 DIAGNOSIS — Z8669 Personal history of other diseases of the nervous system and sense organs: Secondary | ICD-10-CM | POA: Diagnosis not present

## 2016-08-15 DIAGNOSIS — J449 Chronic obstructive pulmonary disease, unspecified: Secondary | ICD-10-CM | POA: Diagnosis not present

## 2016-08-15 DIAGNOSIS — Z8679 Personal history of other diseases of the circulatory system: Secondary | ICD-10-CM

## 2016-08-15 DIAGNOSIS — Z91048 Other nonmedicinal substance allergy status: Secondary | ICD-10-CM | POA: Diagnosis not present

## 2016-08-15 DIAGNOSIS — M311 Thrombotic microangiopathy: Secondary | ICD-10-CM

## 2016-08-15 DIAGNOSIS — Z8619 Personal history of other infectious and parasitic diseases: Secondary | ICD-10-CM | POA: Diagnosis not present

## 2016-08-15 DIAGNOSIS — F17290 Nicotine dependence, other tobacco product, uncomplicated: Secondary | ICD-10-CM | POA: Diagnosis not present

## 2016-08-15 DIAGNOSIS — Z881 Allergy status to other antibiotic agents status: Secondary | ICD-10-CM | POA: Diagnosis not present

## 2016-08-15 DIAGNOSIS — Z720 Tobacco use: Secondary | ICD-10-CM

## 2016-08-15 DIAGNOSIS — M47819 Spondylosis without myelopathy or radiculopathy, site unspecified: Secondary | ICD-10-CM | POA: Diagnosis not present

## 2016-08-15 DIAGNOSIS — J431 Panlobular emphysema: Secondary | ICD-10-CM

## 2016-08-15 DIAGNOSIS — M3119 Other thrombotic microangiopathy: Secondary | ICD-10-CM

## 2016-08-15 DIAGNOSIS — Z862 Personal history of diseases of the blood and blood-forming organs and certain disorders involving the immune mechanism: Secondary | ICD-10-CM

## 2016-08-15 NOTE — Progress Notes (Signed)
Hematology and Oncology Follow Up Visit  [name] [id number] 1941-01-31 76 y.o. 08/15/2016 10:37 AM   Principle Diagnosis: Encounter Diagnoses  Name Primary?  . TTP (thrombotic thrombocytopenic purpura) (HCC) Yes  . Panlobular emphysema ([place])   . Tobacco abuse   . 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 Initial diagnosis January 2005. Induced into remission withplasma exchange and steroids then Rituxan consolidation. Second relapse October 2011 treated with plasma exchange, steroids, and Rituxan. Third relapse March 2015. Treated with plasma exchange, steroids, Rituxan, and currently on maintenance immunosuppression with azathioprine 100 mg daily. Last plasma exchange given on 08/29/2013. She had a persistent unexplained elevation of her LDH through June 2015 which eventually came down to normal. Additional medical problems include: 1.  Obstructive airway disease from tobacco abuse 2.  Status post emergency L1 through L4 laminectomy for spontaneous epidural abscess.  Blood cultures grew enterococcus.  Associated endocarditis. 3.  Admission 2 for culture-negative sepsis associated with viral URI , April 2015 4.  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  She is now off cigarettes but she is vaping. She denies any headache, double vision, blurred vision, slurred speech, focal weakness, or paresthesias. Currently no dyspnea at rest. She continues on Imuran 100 mg daily.  Medications: reviewed  Allergies:  Allergies  Allergen Reactions  . Cefuroxime Axetil Other (See Comments)    "jittery"  . Adhesive [Tape] Other (See Comments)    Blisters, band-aide brand   . Other Other (See Comments)    Wool: Reaction is hives [employer] tape: Reaction is blistering   . Sulfa Antibiotics  Hives  . Sulfa Drugs Cross Reactors Other (See Comments)    unknown    Review of Systems: See interim history Remaining ROS negative:   Physical Exam: Height 5\' 4"  (1.626 m). Wt Readings from Last 3 Encounters:  02/16/16 176 lb 4.8 oz (80 kg)  08/18/15 161 lb 1.6 oz (73.1 kg)  02/03/15 160 lb (72.6 kg)     General appearance: Well-nourished Caucasian female HENNT: Pharynx no erythema, exudate, mass, or ulcer. No thyromegaly or thyroid nodules Lymph nodes: No cervical, supraclavicular, or axillary lymphadenopathy Breasts:  Lungs: Clear to auscultation, resonant to percussion throughout Heart: Regular rhythm, no murmur, no gallop, no rub, no click, no edema Abdomen: Soft, nontender, normal bowel sounds, no mass, no organomegaly Extremities: No edema, no calf tenderness Musculoskeletal: no joint deformities GU:  Vascular: Carotid pulses 2+, no bruits, dorsalis pedis pulses 2+ symmetric Neurologic: Alert, oriented, PERRLA, optic discs sharp and vessels normal, no hemorrhage or exudate, cranial nerves grossly normal, motor strength 5 over 5, reflexes 1+ symmetric, upper body coordination normal, gait normal, Skin: No rash or ecchymosis  Lab Results: CBC W/Diff    Component Value Date/Time   WBC 4.9 08/02/2016 1132   WBC 4.1 05/17/2016 1109   RBC 4.57 08/02/2016 1132   RBC 4.47 05/17/2016 1109   RBC 4.47 05/17/2016 1109   HGB 14.1 05/17/2016 1109   HGB 12.6 08/02/2013 1008   HCT 42.5 08/02/2016 1132   HCT 38.8 08/02/2013 1008   PLT 125 (L) 08/02/2016 1132   MCV 93 08/02/2016 1132   MCV 97.2 08/02/2013 1008   MCH 32.2 08/02/2016 1132   MCH 31.5 05/17/2016 1109   MCHC 34.6 08/02/2016 1132   MCHC 33.6 05/17/2016 1109   RDW 14.0  08/02/2016 1132   RDW 14.8 (H) 08/02/2013 1008   LYMPHSABS 1.6 08/02/2016 1132   LYMPHSABS 0.2 (L) 08/02/2013 1008   MONOABS 0.5 05/17/2016 1109   MONOABS 0.7 08/02/2013 1008   EOSABS 0.2 08/02/2016 1132   EOSABS 0.3 09/13/2012 1702   EOSABS  0.1 11/09/2007 1100   BASOSABS 0.1 08/02/2016 1132   BASOSABS 0.0 08/02/2013 1008     Chemistry      Component Value Date/Time   NA 144 08/02/2016 1132   NA 140 07/26/2013 1020   K 4.6 08/02/2016 1132   K 4.6 07/26/2013 1020   CL 104 08/02/2016 1132   CL 108 (H) 09/13/2012 1702   CL 107 05/22/2012 1023   CO2 21 08/02/2016 1132   CO2 22 07/26/2013 1020   BUN 19 08/02/2016 1132   BUN 35.1 (H) 07/26/2013 1020   CREATININE 1.12 (H) 08/02/2016 1132   CREATININE 1.07 07/22/2014 1113   CREATININE 0.9 07/26/2013 1020      Component Value Date/Time   CALCIUM 9.8 08/02/2016 1132   CALCIUM 8.7 07/26/2013 1020   ALKPHOS 56 08/02/2016 1132   ALKPHOS 67 07/26/2013 1020   AST 17 08/02/2016 1132   AST 15 07/26/2013 1020   ALT 14 08/02/2016 1132   ALT 27 07/26/2013 1020   BILITOT 0.3 08/02/2016 1132   BILITOT 0.40 07/26/2013 1020       Radiological Studies: No results found.  Impression:  #1. 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 Platelets slowly trending down over the last 12 months.  LDH remains upper limit of normal.  Bilirubin normal.  I am going to repeat a CBC again in 2 months.  If stable then go back to every 3 month schedule.  She still needs ongoing monitoring.  #2. Tobacco addiction: She has switched to an electronic cigarette and is tapering the nicotine concentration.  #3. Obstructive airway disease secondary to #2  Already feeling better and less respiratory symptoms off tobacco.  #4. History of epidural abscess requiring emergency drainage and laminectomy with concomitant enterococcal septicemia and endocarditis. 1/12  #5. History of culture-negative sepsis 4/15  #6. Chronic back pain secondary to degenerative arthritis of the spine  CC: Patient Care Team: Kirk Ruths, MD as PCP - General (Unknown Physician Specialty)   Murriel Hopper, MD, FACP  Hematology-Oncology/Internal Medicine     4/16/201810:37 AM

## 2016-08-15 NOTE — Patient Instructions (Signed)
Lab on 6/5 then back to every 3 months if stable MD visit in 6 months

## 2016-09-27 ENCOUNTER — Other Ambulatory Visit: Payer: Self-pay

## 2016-09-27 MED ORDER — AZATHIOPRINE 50 MG PO TABS: 100.0000 mg | ORAL_TABLET | Freq: Every day | ORAL | 6 refills | Status: DC

## 2016-09-29 NOTE — Telephone Encounter (Signed)
Azathioprine rx was faxed to [facility] yesterday; w/confirmation.

## 2016-10-04 ENCOUNTER — Other Ambulatory Visit: Payer: Medicare Other

## 2017-04-08 ENCOUNTER — Emergency Department: Payer: Medicare Other

## 2017-04-08 ENCOUNTER — Emergency Department
Admission: EM | Admit: 2017-04-08 | Discharge: 2017-04-09 | Disposition: A | Discharge status: Home / Self Care | Payer: Medicare Other | Attending: Emergency Medicine | Admitting: Emergency Medicine

## 2017-04-08 DIAGNOSIS — R109 Unspecified abdominal pain: Secondary | ICD-10-CM

## 2017-04-08 DIAGNOSIS — J449 Chronic obstructive pulmonary disease, unspecified: Secondary | ICD-10-CM | POA: Diagnosis not present

## 2017-04-08 DIAGNOSIS — R1032 Left lower quadrant pain: Secondary | ICD-10-CM | POA: Diagnosis not present

## 2017-04-08 DIAGNOSIS — Z87891 Personal history of nicotine dependence: Secondary | ICD-10-CM | POA: Insufficient documentation

## 2017-04-08 DIAGNOSIS — F329 Major depressive disorder, single episode, unspecified: Secondary | ICD-10-CM | POA: Diagnosis not present

## 2017-04-08 DIAGNOSIS — M545 Low back pain: Secondary | ICD-10-CM | POA: Diagnosis present

## 2017-04-08 LAB — URINALYSIS, COMPLETE (UACMP) WITH MICROSCOPIC
BACTERIA UA: NONE SEEN
BILIRUBIN URINE: NEGATIVE
Glucose, UA: NEGATIVE mg/dL
Hgb urine dipstick: NEGATIVE
KETONES UR: NEGATIVE mg/dL
Leukocytes, UA: NEGATIVE
Nitrite: NEGATIVE
PH: 5 (ref 5.0–8.0)
Protein, ur: NEGATIVE mg/dL
SQUAMOUS EPITHELIAL / LPF: NONE SEEN
Specific Gravity, Urine: 1.015 (ref 1.005–1.030)

## 2017-04-08 LAB — BASIC METABOLIC PANEL
Anion gap: 13 (ref 5–15)
BUN: 20 mg/dL (ref 6–20)
CALCIUM: 9.8 mg/dL (ref 8.9–10.3)
CO2: 25 mmol/L (ref 22–32)
CREATININE: 1.12 mg/dL — AB (ref 0.44–1.00)
Chloride: 102 mmol/L (ref 101–111)
GFR calc non Af Amer: 47 mL/min — ABNORMAL LOW (ref >60)
GFR, EST AFRICAN AMERICAN: 55 mL/min — AB (ref >60)
Glucose, Bld: 111 mg/dL — ABNORMAL HIGH (ref 65–99)
Potassium: 4.1 mmol/L (ref 3.5–5.1)
SODIUM: 140 mmol/L (ref 135–145)

## 2017-04-08 LAB — CBC
HCT: 41 % (ref 35.0–47.0)
Hemoglobin: 14.1 g/dL (ref 12.0–16.0)
MCH: 32 pg (ref 26.0–34.0)
MCHC: 34.3 g/dL (ref 32.0–36.0)
MCV: 93.3 fL (ref 80.0–100.0)
PLATELETS: 154 10*3/uL (ref 150–440)
RBC: 4.4 MIL/uL (ref 3.80–5.20)
RDW: 13.6 % (ref 11.5–14.5)
WBC: 8.4 10*3/uL (ref 3.6–11.0)

## 2017-04-08 LAB — LACTIC ACID, PLASMA: LACTIC ACID, VENOUS: 1.4 mmol/L (ref 0.5–1.9)

## 2017-04-08 MED ORDER — OXYCODONE-ACETAMINOPHEN 5-325 MG PO TABS
1.0000 | ORAL_TABLET | Freq: Once | ORAL | Status: AC
  Administered 2017-04-08: 1 via ORAL
  Filled 2017-04-08: qty 1

## 2017-04-08 MED ORDER — TRAMADOL HCL 50 MG PO TABS: 50.0000 mg | ORAL_TABLET | Freq: Four times a day (QID) | ORAL | 0 refills | Status: DC | PRN

## 2017-04-08 MED ORDER — POLYETHYLENE GLYCOL 3350 17 G PO PACK: 17.0000 g | PACK | Freq: Every day | ORAL | 0 refills | Status: DC

## 2017-04-08 MED ORDER — IOPAMIDOL (ISOVUE-300) INJECTION 61%
100.0000 mL | Freq: Once | INTRAVENOUS | Status: AC | PRN
  Administered 2017-04-08: 100 mL via INTRAVENOUS

## 2017-04-08 MED ORDER — HYDROMORPHONE HCL 1 MG/ML IJ SOLN
0.5000 mg | Freq: Once | INTRAMUSCULAR | Status: AC
  Administered 2017-04-08: 0.5 mg via INTRAVENOUS
  Filled 2017-04-08: qty 1

## 2017-04-08 MED ORDER — DIAZEPAM 2 MG PO TABS: 2.0000 mg | ORAL_TABLET | Freq: Three times a day (TID) | ORAL | 0 refills | Status: AC | PRN

## 2017-04-08 NOTE — ED Triage Notes (Signed)
Patient c/o left flank pain beginning Sunday. Patient denies N/V, changes to urination

## 2017-04-08 NOTE — ED Notes (Signed)

## 2017-04-08 NOTE — ED Provider Notes (Signed)
[facility] Emergency Department Provider Note       Time seen: ----------------------------------------- 9:55 PM on 04/08/2017 -----------------------------------------   I have reviewed the triage vital signs and the nursing notes.  HISTORY   Chief Complaint Flank Pain    HPI [name] is a 76 y.o. female with a history of COPD, endocarditis, epidural abscess who presents to the ED for severe left-sided low back pain.  Patient states left flank pain began on Sunday.  Currently the pain is 10 out of 10, nothing makes it better or worse.  She denies any fevers, chills, chest pain, shortness of breath, vomiting or dysuria.  She had severe midline back pain from an epidural abscess 6 years ago but this is much more lateral and on the left side.  She denies any numbness or radicular pain.  Past Medical History:  Diagnosis Date  . Abnormal laboratory test 09/10/2013   Persistent elevation LDH  . Acute delirium   . Anemia   . Arthritis   . Bacteremia 03/2011  . Blood dyscrasia   . Blood transfusion   . Bronchitis, chronic obstructive w acute bronchitis ([place]) 08/12/2013  . Chronic back pain   . COPD (chronic obstructive pulmonary disease) ([place])   . Depression    "mild"  . 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 History of TTP (thrombotic thrombocytopenic purpura)   . Hypokalemia 08/08/2013   Steroid related  . Normal echocardiogram 03/15/11  . Oral thrush 08/08/2013  . Septicemia 03/2011  . Spinal stenosis, lumbar   . Tobacco abuse 05/10/2013    Patient Active Problem List   Diagnosis Date Noted  . Abnormal laboratory test 09/10/2013  . Pulmonary edema 08/19/2013  . Bronchitis, chronic obstructive w acute bronchitis ([place]) 08/12/2013  . Fever 08/12/2013  . Oral thrush 08/08/2013  . Hypokalemia 08/08/2013  . History of plasmapheresis 08/08/2013  . TTP (thrombotic thrombocytopenic purpura) ([place]) 07/05/2013  .  Tobacco abuse 05/10/2013  . Enterococcal sepsis ([place]) 06/13/2011  . Endocarditis, bacterial, acute/subacute 06/13/2011  . Gait instability 06/13/2011  . 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 Sepsis(995.91) 03/16/2011  . History of TTP (thrombotic thrombocytopenic purpura) 03/16/2011  . COPD (chronic obstructive pulmonary disease) ([place]) 03/16/2011    Past Surgical History:  Procedure Laterality Date  . CATARACT EXTRACTION W/ INTRAOCULAR LENS  IMPLANT, BILATERAL  ~ 2010  . CESAREAN SECTION  10/09/1976  . EYE SURGERY    . INSERTION OF DIALYSIS CATHETER Left 07/12/2013   Procedure: INSERTION OF DIALYSIS CATHETER   with Ultrasound;  Surgeon: Rosetta Posner, MD;  Location: [place];  Service: Vascular;  Laterality: Left;  . LUMBAR LAMINECTOMY/DECOMPRESSION MICRODISCECTOMY  03/16/2011   Procedure: LUMBAR LAMINECTOMY/DECOMPRESSION MICRODISCECTOMY;  Surgeon: Elaina Hoops;  Location: [place] NEURO ORS;  Service: Neurosurgery;  Laterality: N/A;  . TEE WITHOUT CARDIOVERSION  03/21/2011   Procedure: TRANSESOPHAGEAL ECHOCARDIOGRAM (TEE);  Surgeon: Laverda Page;  Location[facility] ENDOSCOPY;  Service: Cardiovascular;  Laterality: N/A;  TEE for vegetations  . TONSILLECTOMY    . TUBAL LIGATION      Allergies Cefuroxime axetil; Adhesive [tape]; Other; Sulfa antibiotics; and Sulfa drugs cross reactors  Social History Social History   Tobacco Use  . Smoking status: Former Smoker    Years: 55.00    Types: E-cigarettes  . Smokeless tobacco: Never Used  . Tobacco comment: Vapors  Substance Use Topics  . Alcohol use: Yes    Alcohol/week: 4.2 oz  Types: 7 Cans of beer per week    Comment: very seldom.  . Drug use: No    Review of Systems Constitutional: Negative for fever. Eyes: Negative for vision changes ENT:  Negative for congestion, sore throat Cardiovascular: Negative for chest pain. Respiratory: Negative for shortness of breath. Gastrointestinal: Negative for abdominal pain, vomiting and  diarrhea. Genitourinary: Negative for dysuria. Musculoskeletal: Positive for back pain Skin: Negative for rash. Neurological: Negative for headaches, focal weakness or numbness.  All systems negative/normal/unremarkable except as stated in the HPI  ____________________________________________   PHYSICAL EXAM:  VITAL SIGNS: ED Triage Vitals  Enc Vitals Group     BP      Pulse      Resp      Temp      Temp src      SpO2      Weight      Height      Head Circumference      Peak Flow      Pain Score      Pain Loc      Pain Edu?      Excl. in [place]?     Constitutional: Alert and oriented.  Mild distress Eyes: Conjunctivae are normal. Normal extraocular movements. ENT   Head: Normocephalic and atraumatic.   Nose: No congestion/rhinnorhea.   Mouth/Throat: Mucous membranes are moist.   Neck: No stridor. Cardiovascular: Normal rate, regular rhythm. No murmurs, rubs, or gallops. Respiratory: Normal respiratory effort without tachypnea nor retractions. Breath sounds are clear and equal bilaterally. No wheezes/rales/rhonchi. Gastrointestinal: Soft and nontender. Normal bowel sounds Musculoskeletal: Nontender with normal range of motion in extremities. No lower extremity tenderness nor edema. Neurologic:  Normal speech and language. No gross focal neurologic deficits are appreciated.  Skin:  Skin is warm, dry and intact. No rash noted. Psychiatric: Mood and affect are normal. Speech and behavior are normal.  ____________________________________________  ED COURSE:  Pertinent labs & imaging results that were available during my care of the patient were reviewed by me and considered in my medical decision making (see chart for details). Patient presents for left flank pain, we will assess with labs and imaging as indicated.   Procedures ____________________________________________   LABS (pertinent positives/negatives)  Labs Reviewed  URINALYSIS, COMPLETE (UACMP)  WITH MICROSCOPIC - Abnormal; Notable for the following components:      Result Value   Color, Urine YELLOW (*)    APPearance CLEAR (*)    All other components within normal limits  BASIC METABOLIC PANEL - Abnormal; Notable for the following components:   Glucose, Bld 111 (*)    Creatinine, Ser 1.12 (*)    GFR calc non Af Amer 47 (*)    GFR calc Af Amer 55 (*)    All other components within normal limits  CULTURE, BLOOD (ROUTINE X 2)  CULTURE, BLOOD (ROUTINE X 2)  CBC  LACTIC ACID, PLASMA    RADIOLOGY Images were viewed by me  CT of the abdomen and pelvis with IV contrast IMPRESSION: 1. 2 mm nonobstructing LEFT upper pole nephrolithiasis. 2. Moderate amount of retained large bowel stool without bowel obstruction or acute intra-abdominal/pelvic process. 3. **An incidental finding of potential clinical significance has been found. 6.5 cm LEFT adnexal cyst. Recommend pelvic ultrasound. This recommendation follows ACR consensus guidelines: White Paper of the ACR Incidental Findings Committee II on Adnexal Findings. J Am Coll Radiol [phone]. ** ____________________________________________  DIFFERENTIAL DIAGNOSIS   Renal colic, muscle strain, muscle spasm, abscess, pyelonephritis, UTI,  dissection, AAA  FINAL ASSESSMENT AND PLAN  Flank pain  Plan: Patient had presented for worsening flank pain. Patient's labs are reassuring including normal CBC, urine, lactic acid. Patient's imaging was also reassuring.  There is a possibility that she passed a kidney stone.  She does appear somewhat constipated I will encourage MiraLAX for this.  Overall she is stable for outpatient follow-up.   Earleen Newport, MD   Note: This note was generated in part or whole with voice recognition software. Voice recognition is usually quite accurate but there are transcription errors that can and very often do occur. I apologize for any typographical errors that were not detected and  corrected.     Earleen Newport, MD 04/08/17 [phone]

## 2017-04-13 LAB — CULTURE, BLOOD (ROUTINE X 2)
CULTURE: NO GROWTH
CULTURE: NO GROWTH
SPECIAL REQUESTS: ADEQUATE
Special Requests: ADEQUATE

## 2017-05-10 ENCOUNTER — Telehealth: Payer: Self-pay | Admitting: Internal Medicine

## 2017-05-10 ENCOUNTER — Other Ambulatory Visit: Payer: Self-pay | Admitting: Internal Medicine

## 2017-05-10 NOTE — Telephone Encounter (Deleted)
[employer]- [phone] please call for pre authorized. Azathiomrine 50mg . [employer] Member ID: [id number]  Fax# [phone] Pls transfer to [facility]

## 2017-05-10 NOTE — Telephone Encounter (Signed)
[employer] [phone] please call for pre authorization Azathimorine 50 mg [employer] Member ID: [id number] Fax# [phone].  Pls send to [facility]

## 2017-05-10 NOTE — Telephone Encounter (Deleted)
Disregard this is not our patient. Sorry

## 2017-05-10 NOTE — Telephone Encounter (Signed)
Disregard, open in error.

## 2017-05-12 MED ORDER — AZATHIOPRINE 50 MG PO TABS: 100.0000 mg | ORAL_TABLET | Freq: Every day | ORAL | 6 refills | Status: DC

## 2017-05-12 NOTE — Telephone Encounter (Addendum)
Received faxed documentation that pt's PA was denied.  I will follow up with ordering MD to work on an appeal.[place], Darlene Cassady1/11/20194:56 PM

## 2017-06-20 ENCOUNTER — Ambulatory Visit (INDEPENDENT_AMBULATORY_CARE_PROVIDER_SITE_OTHER): Payer: Medicare Other | Admitting: Oncology

## 2017-06-20 ENCOUNTER — Encounter: Payer: Self-pay | Admitting: Oncology

## 2017-06-20 ENCOUNTER — Other Ambulatory Visit: Payer: Self-pay

## 2017-06-20 VITALS — BP 143/57 | HR 92 | Temp 98.0°F | Ht 64.0 in | Wt 176.3 lb

## 2017-06-20 DIAGNOSIS — Z8679 Personal history of other diseases of the circulatory system: Secondary | ICD-10-CM

## 2017-06-20 DIAGNOSIS — Z862 Personal history of diseases of the blood and blood-forming organs and certain disorders involving the immune mechanism: Secondary | ICD-10-CM | POA: Diagnosis not present

## 2017-06-20 DIAGNOSIS — N838 Other noninflammatory disorders of ovary, fallopian tube and broad ligament: Secondary | ICD-10-CM

## 2017-06-20 DIAGNOSIS — Z8739 Personal history of other diseases of the musculoskeletal system and connective tissue: Secondary | ICD-10-CM | POA: Diagnosis not present

## 2017-06-20 DIAGNOSIS — Z79899 Other long term (current) drug therapy: Secondary | ICD-10-CM

## 2017-06-20 DIAGNOSIS — M47819 Spondylosis without myelopathy or radiculopathy, site unspecified: Secondary | ICD-10-CM | POA: Diagnosis not present

## 2017-06-20 DIAGNOSIS — F17290 Nicotine dependence, other tobacco product, uncomplicated: Secondary | ICD-10-CM | POA: Diagnosis not present

## 2017-06-20 DIAGNOSIS — G8929 Other chronic pain: Secondary | ICD-10-CM | POA: Diagnosis not present

## 2017-06-20 DIAGNOSIS — Z882 Allergy status to sulfonamides status: Secondary | ICD-10-CM | POA: Diagnosis not present

## 2017-06-20 DIAGNOSIS — Z8619 Personal history of other infectious and parasitic diseases: Secondary | ICD-10-CM | POA: Diagnosis not present

## 2017-06-20 DIAGNOSIS — Z881 Allergy status to other antibiotic agents status: Secondary | ICD-10-CM

## 2017-06-20 DIAGNOSIS — Z87442 Personal history of urinary calculi: Secondary | ICD-10-CM | POA: Diagnosis not present

## 2017-06-20 DIAGNOSIS — E876 Hypokalemia: Secondary | ICD-10-CM

## 2017-06-20 DIAGNOSIS — Z91048 Other nonmedicinal substance allergy status: Secondary | ICD-10-CM

## 2017-06-20 NOTE — Progress Notes (Signed)
Hematology and Oncology Follow Up Visit  [name] [id number] [birth year] 77 y.o. 06/20/2017 10:15 AM   Principle Diagnosis: TTP in third remission Encounter Diagnosis  Name Primary?    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 Initial diagnosis January 2005. Induced into remission withplasma exchange and steroids then Rituxan consolidation. Second relapse October 2011 treated with plasma exchange, steroids, and Rituxan. Third relapse March 2015. Treated with plasma exchange, steroids, Rituxan, and currently on maintenance immunosuppression with azathioprine 100 mg daily. Last plasma exchange given on 08/29/2013. She had a persistent unexplained elevation of her LDH through June 2015 which eventually came down to normal.   Interim History: She was doing well until April 08, 2017 when she had an emergency room visit to evaluate acute left flank pain.  CT scan showed a 2 mm non-obstructing left upper pole kidney stone.  Large amount of retained stool without any evidence for obstruction or intra-abdominal abscess.  Incidental 6.5 cm left adnexal cyst.  CBC normal at that time with no evidence for recurrent TTP.  Laxative was prescribed and she was discharged.  She really thinks that this was a flareup of her chronic back pain and had nothing to do with constipation.  Her symptoms have subsided.  She is not interesting in having a major back surgery.  She denies any new neurologic symptoms specifically no headache, double vision, blurred vision, focal weakness, dysarthria, or paresthesias.  She continues on Imuran 100 mg daily and has now been in a durable remission for almost 4 years since last recurrence.  Her family is doing well.  Her grandson will be 5 this year.  Medications: reviewed  Allergies:  Allergies  Allergen Reactions  . Cefuroxime Axetil Other (See Comments)    "jittery"  . Adhesive [Tape] Other (See Comments)    Blisters,  band-aide brand   . Other Other (See Comments)    Wool: Reaction is hives [employer] tape: Reaction is blistering   . Sulfa Antibiotics Hives  . Sulfa Drugs Cross Reactors Other (See Comments)    unknown    Review of Systems: See interim history Remaining ROS negative:   Physical Exam: Height 5\' 4"  (1.626 m). Wt Readings from Last 3 Encounters:  04/08/17 173 lb (78.5 kg)  08/15/16 173 lb 9.6 oz (78.7 kg)  02/16/16 176 lb 4.8 oz (80 kg)     General appearance: Well-nourished Caucasian woman HENNT: Pharynx no erythema, exudate, mass, or ulcer. No thyromegaly or thyroid nodules Lymph nodes: No cervical, supraclavicular, or axillary lymphadenopathy Breasts:  Lungs: Clear to auscultation, resonant to percussion throughout Heart: Regular rhythm, no murmur, no gallop, no rub, no click, no edema Abdomen: Soft, nontender, normal bowel sounds, no mass, no organomegaly Extremities: No edema, no calf tenderness Musculoskeletal: no joint deformities GU:  Vascular: Carotid pulses 2+, no bruits, Neurologic: Alert, oriented, PERRLA, optic discs sharp and vessels normal, no hemorrhage or exudate, cranial nerves grossly normal, motor strength 5 over 5, reflexes 1+ symmetric, upper body coordination normal, gait normal, Skin: No rash or ecchymosis  Lab Results: CBC W/Diff    Component Value Date/Time   WBC 8.4 04/08/2017 2155   RBC 4.40 04/08/2017 2155   HGB 14.1 04/08/2017 2155   HGB 14.7 08/02/2016 1132   HGB 12.6 08/02/2013 1008   HCT 41.0 04/08/2017 2155   HCT 42.5 08/02/2016 1132   HCT 38.8 08/02/2013 1008   PLT 154 04/08/2017 2155   PLT 125 (L) 08/02/2016 1132  MCV 93.3 04/08/2017 2155   MCV 93 08/02/2016 1132   MCV 97.2 08/02/2013 1008   MCH 32.0 04/08/2017 2155   MCHC 34.3 04/08/2017 2155   RDW 13.6 04/08/2017 2155   RDW 14.0 08/02/2016 1132   RDW 14.8 (H) 08/02/2013 1008   LYMPHSABS 1.6 08/02/2016 1132   LYMPHSABS 0.2 (L) 08/02/2013 1008   MONOABS  0.5 05/17/2016 1109   MONOABS 0.7 08/02/2013 1008   EOSABS 0.2 08/02/2016 1132   EOSABS 0.3 09/13/2012 1702   EOSABS 0.1 11/09/2007 1100   BASOSABS 0.1 08/02/2016 1132   BASOSABS 0.0 08/02/2013 1008     Chemistry      Component Value Date/Time   NA 140 04/08/2017 2155   NA 144 08/02/2016 1132   NA 140 07/26/2013 1020   K 4.1 04/08/2017 2155   K 4.6 07/26/2013 1020   CL 102 04/08/2017 2155   CL 108 (H) 09/13/2012 1702   CL 107 05/22/2012 1023   CO2 25 04/08/2017 2155   CO2 22 07/26/2013 1020   BUN 20 04/08/2017 2155   BUN 19 08/02/2016 1132   BUN 35.1 (H) 07/26/2013 1020   CREATININE 1.12 (H) 04/08/2017 2155   CREATININE 1.07 07/22/2014 1113   CREATININE 0.9 07/26/2013 1020      Component Value Date/Time   CALCIUM 9.8 04/08/2017 2155   CALCIUM 8.7 07/26/2013 1020   ALKPHOS 56 08/02/2016 1132   ALKPHOS 67 07/26/2013 1020   AST 17 08/02/2016 1132   AST 15 07/26/2013 1020   ALT 14 08/02/2016 1132   ALT 27 07/26/2013 1020   BILITOT 0.3 08/02/2016 1132   BILITOT 0.40 07/26/2013 1020       Radiological Studies: No results found.  Impression:  1.  TTP in third remission She remains stable in a durable remission on Imuran 100 mg daily. I will check labs again today then at 75-month intervals with a visit in 6 months.  She will get the interim counts with her primary care physician. I will be retiring in the spring of next year.  I will see the patient one more time and August of this year and then refer to a another hematology for follow-up.  2.  History of emergency L1 through L4 laminectomy for spontaneous epidural abscess with associated enterococcus septicemia and endocarditis January 2012.  3.  Hospitalization for culture negative sepsis associated with viral URI April 2015  4.  Obstructive airway disease  5.  Chronic back pain due to degenerative arthritis.  6.  Previous tobacco addiction.  She switched to electronic cigarettes.  CC: Patient Care  Team: Kirk Ruths, MD as PCP - General (Unknown Physician Specialty)   Murriel Hopper, MD, FACP  Hematology-Oncology/Internal Medicine     2/19/201910:15 AM

## 2017-06-20 NOTE — Patient Instructions (Signed)
To lab today Return visit 8/19. Lab same day

## 2017-06-21 LAB — CBC WITH DIFFERENTIAL/PLATELET
BASOS: 1 %
Basophils Absolute: 0 10*3/uL (ref 0.0–0.2)
EOS (ABSOLUTE): 0.3 10*3/uL (ref 0.0–0.4)
Eos: 5 %
Hematocrit: 42.1 % (ref 34.0–46.6)
Hemoglobin: 14.1 g/dL (ref 11.1–15.9)
IMMATURE GRANULOCYTES: 0 %
Immature Grans (Abs): 0 10*3/uL (ref 0.0–0.1)
Lymphocytes Absolute: 1.3 10*3/uL (ref 0.7–3.1)
Lymphs: 24 %
MCH: 31.4 pg (ref 26.6–33.0)
MCHC: 33.5 g/dL (ref 31.5–35.7)
MCV: 94 fL (ref 79–97)
MONOS ABS: 0.6 10*3/uL (ref 0.1–0.9)
Monocytes: 11 %
NEUTROS PCT: 59 %
Neutrophils Absolute: 3.2 10*3/uL (ref 1.4–7.0)
Platelets: 141 10*3/uL — ABNORMAL LOW (ref 150–379)
RBC: 4.49 x10E6/uL (ref 3.77–5.28)
RDW: 14.6 % (ref 12.3–15.4)
WBC: 5.3 10*3/uL (ref 3.4–10.8)

## 2017-06-21 LAB — COMPREHENSIVE METABOLIC PANEL
A/G RATIO: 1.7 (ref 1.2–2.2)
ALK PHOS: 64 IU/L (ref 39–117)
ALT: 14 IU/L (ref 0–32)
AST: 16 IU/L (ref 0–40)
Albumin: 4.2 g/dL (ref 3.5–4.8)
BILIRUBIN TOTAL: 0.3 mg/dL (ref 0.0–1.2)
BUN/Creatinine Ratio: 15 (ref 12–28)
BUN: 17 mg/dL (ref 8–27)
CO2: 22 mmol/L (ref 20–29)
Calcium: 9.7 mg/dL (ref 8.7–10.3)
Chloride: 104 mmol/L (ref 96–106)
Creatinine, Ser: 1.14 mg/dL — ABNORMAL HIGH (ref 0.57–1.00)
GFR calc non Af Amer: 47 mL/min/{1.73_m2} — ABNORMAL LOW (ref >59)
GFR, EST AFRICAN AMERICAN: 54 mL/min/{1.73_m2} — AB (ref >59)
GLUCOSE: 125 mg/dL — AB (ref 65–99)
Globulin, Total: 2.5 g/dL (ref 1.5–4.5)
POTASSIUM: 4.2 mmol/L (ref 3.5–5.2)
Sodium: 143 mmol/L (ref 134–144)
Total Protein: 6.7 g/dL (ref 6.0–8.5)

## 2017-06-21 LAB — LACTATE DEHYDROGENASE: LDH: 210 IU/L (ref 119–226)

## 2017-06-21 LAB — RETICULOCYTES: Retic Ct Pct: 1.9 % (ref 0.6–2.6)

## 2017-06-22 ENCOUNTER — Telehealth: Payer: Self-pay | Admitting: *Deleted

## 2017-06-22 NOTE — Telephone Encounter (Signed)
-----   Message from Annia Belt, MD sent at 06/21/2017 10:17 AM EST ----- Call pt: platelets OK @141 ,000

## 2017-06-22 NOTE — Telephone Encounter (Signed)
Called pt - no answer; left message "platelets OK @141 ,000" per Dr Beryle Beams. And call for any questions.

## 2017-10-30 ENCOUNTER — Telehealth: Payer: Self-pay | Admitting: Oncology

## 2017-10-30 ENCOUNTER — Telehealth: Payer: Self-pay | Admitting: Internal Medicine

## 2017-10-30 NOTE — Telephone Encounter (Signed)
Patient is wondering blood work and setting up the appt.

## 2017-10-30 NOTE — Telephone Encounter (Signed)
I will send message to front office/ Susette Racer - pt needs an appt in August w/ labs same day per last note of Dr Beryle Beams.

## 2017-11-16 NOTE — Telephone Encounter (Signed)
Appt has been scheduled for aug.

## 2017-12-18 ENCOUNTER — Ambulatory Visit (INDEPENDENT_AMBULATORY_CARE_PROVIDER_SITE_OTHER): Payer: Medicare Other | Admitting: Oncology

## 2017-12-18 ENCOUNTER — Other Ambulatory Visit: Payer: Self-pay

## 2017-12-18 ENCOUNTER — Encounter: Payer: Self-pay | Admitting: Oncology

## 2017-12-18 VITALS — BP 140/44 | HR 86 | Temp 98.2°F | Ht 64.0 in | Wt 176.0 lb

## 2017-12-18 DIAGNOSIS — G8929 Other chronic pain: Secondary | ICD-10-CM | POA: Diagnosis not present

## 2017-12-18 DIAGNOSIS — M3119 Other thrombotic microangiopathy: Secondary | ICD-10-CM

## 2017-12-18 DIAGNOSIS — M549 Dorsalgia, unspecified: Secondary | ICD-10-CM | POA: Diagnosis not present

## 2017-12-18 DIAGNOSIS — Z882 Allergy status to sulfonamides status: Secondary | ICD-10-CM

## 2017-12-18 DIAGNOSIS — Z8739 Personal history of other diseases of the musculoskeletal system and connective tissue: Secondary | ICD-10-CM

## 2017-12-18 DIAGNOSIS — M311 Thrombotic microangiopathy: Secondary | ICD-10-CM

## 2017-12-18 DIAGNOSIS — G3189 Other specified degenerative diseases of nervous system: Secondary | ICD-10-CM

## 2017-12-18 DIAGNOSIS — Z881 Allergy status to other antibiotic agents status: Secondary | ICD-10-CM

## 2017-12-18 DIAGNOSIS — J449 Chronic obstructive pulmonary disease, unspecified: Secondary | ICD-10-CM

## 2017-12-18 DIAGNOSIS — F17210 Nicotine dependence, cigarettes, uncomplicated: Secondary | ICD-10-CM

## 2017-12-18 DIAGNOSIS — Z91048 Other nonmedicinal substance allergy status: Secondary | ICD-10-CM

## 2017-12-18 MED ORDER — AZATHIOPRINE 50 MG PO TABS: 100.0000 mg | ORAL_TABLET | Freq: Every day | ORAL | 6 refills | Status: DC

## 2017-12-18 NOTE — Progress Notes (Signed)
Hematology and Oncology Follow Up Visit  [name] [id number] Apr 23, 1941 77 y.o. 12/18/2017 6:47 PM   Principle Diagnosis: Encounter Diagnosis  Name Primary?  . 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 Initial diagnosis January 2005. Induced into remission withplasma exchange and steroids then Rituxan consolidation. Second relapse October 2011 treated with plasma exchange, steroids, and Rituxan. Third relapse March 2015. Treated with plasma exchange, steroids, Rituxan, and currently on maintenance immunosuppression with azathioprine 100 mg daily. Last plasma exchange given on 08/29/2013. She had a persistent unexplained elevation of her LDH through June 2015 which eventually came down to normal.   Interim History: She is now out over 4 years from most recent relapse.  She is tolerating Imuran well.  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 She denies any headache, change in vision, slurred speech, focal weakness, or paresthesias. She continues to smoke.  She is trying to substitute cigarettes with vaping.  I informed her of the recent reports of severe lung damage and some people who vape. She has had no interim infections. Husband accompanies her today as usual.  Medications: reviewed  Allergies:  Allergies  Allergen Reactions  . Cefuroxime Axetil Other (See Comments)    "jittery"  . Adhesive [Tape] Other (See Comments)    Blisters, band-aide brand   . Other Other (See Comments)    Wool: Reaction is hives [employer] tape: Reaction is blistering   . Sulfa Antibiotics Hives  . Sulfa Drugs Cross Reactors Other (See Comments)    unknown    Review of Systems: See interim history Remaining ROS negative:   Physical Exam: Blood pressure (!) 140/44, pulse 86, temperature 98.2 F (36.8 C), temperature  source Oral, height 5\' 4"  (1.626 m), weight 176 lb (79.8 kg), SpO2 96 %. Wt Readings from Last 3 Encounters:  12/18/17 176 lb (79.8 kg)  06/20/17 176 lb 4.8 oz (80 kg)  04/08/17 173 lb (78.5 kg)     General appearance: Well-nourished Caucasian woman HENNT: Pharynx no erythema, exudate, mass, or ulcer. No thyromegaly or thyroid nodules Lymph nodes: No cervical, supraclavicular, or axillary lymphadenopathy Breasts: Lungs: Clear to auscultation, resonant to percussion throughout Heart: Regular rhythm, no murmur, no gallop, no rub, no click, no edema Abdomen: Soft, nontender, normal bowel sounds, no mass, no organomegaly Extremities: No edema, no calf tenderness Musculoskeletal: no joint deformities GU:  Vascular: Carotid pulses 2+, no bruits, distal pulses: Dorsalis pedis 1+ symmetric Neurologic: Alert, oriented, PERRLA, , cranial nerves grossly normal, motor strength 5 over 5, reflexes 1+ symmetric, upper body coordination normal, gait normal, Skin: No rash or ecchymosis  Lab Results: CBC W/Diff    Component Value Date/Time   WBC 5.3 06/20/2017 1040   WBC 8.4 04/08/2017 2155   RBC 4.49 06/20/2017 1040   RBC 4.40 04/08/2017 2155   HGB 14.1 06/20/2017 1040   HGB 12.6 08/02/2013 1008   HCT 42.1 06/20/2017 1040   HCT 38.8 08/02/2013 1008   PLT 141 (L) 06/20/2017 1040   MCV 94 06/20/2017 1040   MCV 97.2 08/02/2013 1008   MCH 31.4 06/20/2017 1040   MCH 32.0 04/08/2017 2155   MCHC 33.5 06/20/2017 1040   MCHC 34.3 04/08/2017 2155   RDW 14.6 06/20/2017 1040   RDW 14.8 (H) 08/02/2013 1008   LYMPHSABS 1.3 06/20/2017 1040   LYMPHSABS 0.2 (L) 08/02/2013 1008   MONOABS 0.5  05/17/2016 1109   MONOABS 0.7 08/02/2013 1008   EOSABS 0.3 06/20/2017 1040   EOSABS 0.3 09/13/2012 1702   EOSABS 0.1 11/09/2007 1100   BASOSABS 0.0 06/20/2017 1040   BASOSABS 0.0 08/02/2013 1008     Chemistry      Component Value Date/Time   NA 143 06/20/2017 1040   NA 140 07/26/2013 1020   K 4.2 06/20/2017  1040   K 4.6 07/26/2013 1020   CL 104 06/20/2017 1040   CL 108 (H) 09/13/2012 1702   CL 107 05/22/2012 1023   CO2 22 06/20/2017 1040   CO2 22 07/26/2013 1020   BUN 17 06/20/2017 1040   BUN 35.1 (H) 07/26/2013 1020   CREATININE 1.14 (H) 06/20/2017 1040   CREATININE 1.07 07/22/2014 1113   CREATININE 0.9 07/26/2013 1020      Component Value Date/Time   CALCIUM 9.7 06/20/2017 1040   CALCIUM 8.7 07/26/2013 1020   ALKPHOS 64 06/20/2017 1040   ALKPHOS 67 07/26/2013 1020   AST 16 06/20/2017 1040   AST 15 07/26/2013 1020   ALT 14 06/20/2017 1040   ALT 27 07/26/2013 1020   BILITOT 0.3 06/20/2017 1040   BILITOT 0.40 07/26/2013 1020    Today's lab pending   Radiological Studies: No results found.  Impression:  1.  TTP in third remission Pending today's lab values, no evidence for recurrence. Plan continue Imuran 100 mg daily.  Lab monitoring every 3 months for renal and hepatic function as well as parameters for hemolysis and thrombocytopenia.  2.  COPD with ongoing tobacco abuse. No incentive to stop.  She tried Chantix.  Still on fluoxetine.  3.  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  4.  Culture negative sepsis associated with a viral URI requiring hospitalization April 2015.  5.  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  I will see her again in 6 months then transition her hematology care to Dr. Irene Limbo at [facility].  CC: Patient Care Team: Kirk Ruths, MD as PCP - General (Unknown Physician Specialty)   Murriel Hopper, MD, [place]  Hematology-Oncology/Internal Medicine     8/19/20196:47 PM

## 2017-12-18 NOTE — Patient Instructions (Signed)
Lab in 3 months & again in 6 months MD visit 6 months  Please schedule new patient visit with Dr Irene Limbo or new MD at [facility] center for March or April 2020: TTP in 3rd remission on chronic Imuran

## 2017-12-19 ENCOUNTER — Telehealth: Payer: Self-pay | Admitting: *Deleted

## 2017-12-19 LAB — COMPREHENSIVE METABOLIC PANEL
ALT: 14 IU/L (ref 0–32)
AST: 19 IU/L (ref 0–40)
Albumin/Globulin Ratio: 1.9 (ref 1.2–2.2)
Albumin: 4.3 g/dL (ref 3.5–4.8)
Alkaline Phosphatase: 58 IU/L (ref 39–117)
BILIRUBIN TOTAL: 0.3 mg/dL (ref 0.0–1.2)
BUN/Creatinine Ratio: 18 (ref 12–28)
BUN: 20 mg/dL (ref 8–27)
CHLORIDE: 105 mmol/L (ref 96–106)
CO2: 24 mmol/L (ref 20–29)
Calcium: 9.7 mg/dL (ref 8.7–10.3)
Creatinine, Ser: 1.09 mg/dL — ABNORMAL HIGH (ref 0.57–1.00)
GFR calc non Af Amer: 50 mL/min/{1.73_m2} — ABNORMAL LOW (ref >59)
GFR, EST AFRICAN AMERICAN: 57 mL/min/{1.73_m2} — AB (ref >59)
GLUCOSE: 102 mg/dL — AB (ref 65–99)
Globulin, Total: 2.3 g/dL (ref 1.5–4.5)
Potassium: 4.8 mmol/L (ref 3.5–5.2)
Sodium: 144 mmol/L (ref 134–144)
TOTAL PROTEIN: 6.6 g/dL (ref 6.0–8.5)

## 2017-12-19 LAB — CBC WITH DIFFERENTIAL/PLATELET
BASOS ABS: 0 10*3/uL (ref 0.0–0.2)
BASOS: 1 %
EOS (ABSOLUTE): 0.2 10*3/uL (ref 0.0–0.4)
Eos: 4 %
Hematocrit: 43.1 % (ref 34.0–46.6)
Hemoglobin: 14.5 g/dL (ref 11.1–15.9)
IMMATURE GRANS (ABS): 0 10*3/uL (ref 0.0–0.1)
IMMATURE GRANULOCYTES: 0 %
LYMPHS: 25 %
Lymphocytes Absolute: 1.2 10*3/uL (ref 0.7–3.1)
MCH: 31.3 pg (ref 26.6–33.0)
MCHC: 33.6 g/dL (ref 31.5–35.7)
MCV: 93 fL (ref 79–97)
MONOS ABS: 0.5 10*3/uL (ref 0.1–0.9)
Monocytes: 10 %
NEUTROS PCT: 60 %
Neutrophils Absolute: 3 10*3/uL (ref 1.4–7.0)
PLATELETS: 135 10*3/uL — AB (ref 150–450)
RBC: 4.64 x10E6/uL (ref 3.77–5.28)
RDW: 14.5 % (ref 12.3–15.4)
WBC: 5 10*3/uL (ref 3.4–10.8)

## 2017-12-19 LAB — LACTATE DEHYDROGENASE: LDH: 219 IU/L (ref 119–226)

## 2017-12-19 LAB — RETICULOCYTES: RETIC CT PCT: 1.5 % (ref 0.6–2.6)

## 2017-12-19 NOTE — Telephone Encounter (Signed)
-----   Message from Annia Belt, MD sent at 12/19/2017  8:11 AM EDT ----- Call pt: platelets slightly lower than last time @ 135,000, last one 141,000, but in range of previous values over the last year; everything else good

## 2017-12-19 NOTE — Telephone Encounter (Signed)
Called pt - talked to her daughter who stated pt is sleeping.Informed daughter "platelets slightly lower than last time @ 135,000, last one 141,000, but in range of previous values over the last year; everything else good " per Dr. Beryle Beams; stated she will let her of results.

## 2018-02-25 ENCOUNTER — Encounter: Payer: Self-pay | Admitting: Emergency Medicine

## 2018-02-25 ENCOUNTER — Emergency Department: Payer: Medicare Other

## 2018-02-25 ENCOUNTER — Emergency Department
Admission: EM | Admit: 2018-02-25 | Discharge: 2018-02-25 | Disposition: A | Discharge status: Home / Self Care | Payer: Medicare Other | Attending: Emergency Medicine | Admitting: Emergency Medicine

## 2018-02-25 ENCOUNTER — Other Ambulatory Visit: Payer: Self-pay

## 2018-02-25 DIAGNOSIS — W06XXXA Fall from bed, initial encounter: Secondary | ICD-10-CM | POA: Diagnosis not present

## 2018-02-25 DIAGNOSIS — R270 Ataxia, unspecified: Secondary | ICD-10-CM | POA: Insufficient documentation

## 2018-02-25 DIAGNOSIS — M25511 Pain in right shoulder: Secondary | ICD-10-CM | POA: Diagnosis not present

## 2018-02-25 DIAGNOSIS — Y9384 Activity, sleeping: Secondary | ICD-10-CM | POA: Diagnosis not present

## 2018-02-25 DIAGNOSIS — S161XXA Strain of muscle, fascia and tendon at neck level, initial encounter: Secondary | ICD-10-CM | POA: Insufficient documentation

## 2018-02-25 DIAGNOSIS — M25512 Pain in left shoulder: Secondary | ICD-10-CM | POA: Insufficient documentation

## 2018-02-25 DIAGNOSIS — J449 Chronic obstructive pulmonary disease, unspecified: Secondary | ICD-10-CM | POA: Insufficient documentation

## 2018-02-25 DIAGNOSIS — Z79899 Other long term (current) drug therapy: Secondary | ICD-10-CM | POA: Diagnosis not present

## 2018-02-25 DIAGNOSIS — Y92013 Bedroom of single-family (private) house as the place of occurrence of the external cause: Secondary | ICD-10-CM | POA: Insufficient documentation

## 2018-02-25 DIAGNOSIS — W19XXXA Unspecified fall, initial encounter: Secondary | ICD-10-CM

## 2018-02-25 DIAGNOSIS — Z87891 Personal history of nicotine dependence: Secondary | ICD-10-CM | POA: Insufficient documentation

## 2018-02-25 DIAGNOSIS — Y998 Other external cause status: Secondary | ICD-10-CM | POA: Insufficient documentation

## 2018-02-25 DIAGNOSIS — S199XXA Unspecified injury of neck, initial encounter: Secondary | ICD-10-CM | POA: Diagnosis present

## 2018-02-25 MED ORDER — TRAMADOL HCL 50 MG PO TABS: 50.0000 mg | ORAL_TABLET | Freq: Four times a day (QID) | ORAL | 0 refills | Status: DC | PRN

## 2018-02-25 MED ORDER — PREDNISONE 10 MG (21) PO TBPK: ORAL_TABLET | ORAL | 0 refills | Status: DC

## 2018-02-25 NOTE — ED Triage Notes (Signed)
Pt to ED via POV for fall out of bed. Pt states that she hit her head on the night stand. Pt denies LOC. Pt is not on blood thinners. Pt is c/o pain in the neck side of her neck. Pt is in NAD at this time.

## 2018-02-25 NOTE — Discharge Instructions (Addendum)
Follow-up with your regular doctor if not better in 3 to 5 days.  Return to the emergency department if you are worsening.  Take the medications as prescribed.  Apply ice to all areas.  Follow-up with your regular doctor concerning the chest CT.

## 2018-02-25 NOTE — ED Provider Notes (Signed)
[facility] Emergency Department Provider Note  ____________________________________________   None    (approximate)  I have reviewed the triage vital signs and the nursing notes.   HISTORY  Chief Complaint Fall    HPI [name] is a 77 y.o. female presents emergency department after falling out of the bed on Monday night.  She hit her head on the marble nightstand.  She is not on blood thinners but has a history of TTP.  Her daughter states that her platelets were low around 100 on her last visit to her doctor.  She has been seeing the chiropractor this week.  She has had no relief in the neck.  She states the pain radiates to both shoulders.  She has pain of the left side  of the skull.  She denies any loss of consciousness.  She states she has not had a severe headache since the fall.  She takes tramadol for pain on a as needed basis.  She denies any other injuries at this time.   Past Medical History:  Diagnosis Date  . Abnormal laboratory test 09/10/2013   Persistent elevation LDH  . Acute delirium   . Anemia   . Arthritis   . Bacteremia 03/2011  . Blood dyscrasia   . Blood transfusion   . Bronchitis, chronic obstructive w acute bronchitis ([place]) 08/12/2013  . Chronic back pain   . COPD (chronic obstructive pulmonary disease) ([place])   . Depression    "mild"  . 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 History of TTP (thrombotic thrombocytopenic purpura)   . Hypokalemia 08/08/2013   Steroid related  . Normal echocardiogram 03/15/11  . Oral thrush 08/08/2013  . Septicemia 03/2011  . Spinal stenosis, lumbar   . Tobacco abuse 05/10/2013    Patient Active Problem List   Diagnosis Date Noted  . Abnormal laboratory test 09/10/2013  . Pulmonary edema 08/19/2013  . Bronchitis, chronic obstructive w acute bronchitis ([place]) 08/12/2013  . Fever 08/12/2013  . Oral thrush 08/08/2013  . Hypokalemia 08/08/2013  . History  of plasmapheresis 08/08/2013  . TTP (thrombotic thrombocytopenic purpura) ([place]) 07/05/2013  . Tobacco abuse 05/10/2013  . Enterococcal sepsis ([place]) 06/13/2011  . Endocarditis, bacterial, acute/subacute 06/13/2011  . Gait instability 06/13/2011  . 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 Sepsis(995.91) 03/16/2011  . History of TTP (thrombotic thrombocytopenic purpura) 03/16/2011  . COPD (chronic obstructive pulmonary disease) ([place]) 03/16/2011    Past Surgical History:  Procedure Laterality Date  . CATARACT EXTRACTION W/ INTRAOCULAR LENS  IMPLANT, BILATERAL  ~ 2010  . CESAREAN SECTION  10/09/1976  . EYE SURGERY    . INSERTION OF DIALYSIS CATHETER Left 07/12/2013   Procedure: INSERTION OF DIALYSIS CATHETER   with Ultrasound;  Surgeon: Rosetta Posner, MD;  Location: [facility];  Service: Vascular;  Laterality: Left;  . LUMBAR LAMINECTOMY/DECOMPRESSION MICRODISCECTOMY  03/16/2011   Procedure: LUMBAR LAMINECTOMY/DECOMPRESSION MICRODISCECTOMY;  Surgeon: Elaina Hoops;  Location: [place] [facility]UR[facility]S;  Service: Neurosurgery;  Laterality: N/A;  . TEE WITHOUT CARDIOVERSION  03/21/2011   Procedure: TRANSESOPHAGEAL ECHOCARDIOGRAM (TEE);  Surgeon: Laverda Page;  Location: [facility] ENDOSCOPY;  Service: Cardiovascular;  Laterality: N/A;  TEE for vegetations  . TONSILLECTOMY    . TUBAL LIGATION      Prior to Admission medications   Medication Sig Start Date End Date Taking? Authorizing Provider  azaTHIOprine (IMURAN) 50 MG tablet Take 2 tablets (100 mg total) by mouth daily. 12/18/17  Annia Belt, MD  cholecalciferol (VITAMIN D) 400 UNITS TABS Take 400 Units by mouth.    [provider]  cyanocobalamin 100 MCG tablet Take 1,000 mcg by mouth daily.    [provider]  diazepam (VALIUM) 2 MG tablet Take 1 tablet (2 mg total) by mouth every 8 (eight) hours as needed for muscle spasms. 04/08/17 04/08/18  Earleen Newport, MD  FLUoxetine (PROZAC) 20 MG capsule Take 20 mg by mouth daily.     Kirk Ruths, MD  folic acid (FOLVITE) 1 MG tablet Take 1 mg by mouth daily.    [provider]  gabapentin (NEURONTIN) 400 MG capsule Take 400 mg by mouth 3 (three) times daily.    [provider]  Multiple Vitamin (MULTIVITAMIN) tablet Take 1 tablet by mouth 2 (two) times daily.     [provider]  Nicotine 21-14-7 MG/24HR KIT Use as directed 08/18/15   Annia Belt, MD  phenol-menthol (CEPASTAT) 14.5 MG lozenge Place 1 lozenge inside cheek as needed for sore throat. 08/14/13   Lucious Groves, DO  polyethylene glycol (MIRALAX / GLYCOLAX) packet Take 17 g by mouth daily. 04/08/17   Earleen Newport, MD  potassium chloride SA (K-DUR,KLOR-CON) 20 MEQ tablet Take 20 mEq by mouth daily.  08/08/13   [provider]  predniSONE (STERAPRED UNI-PAK 21 TAB) 10 MG (21) TBPK tablet Take 6 pills on day one then decrease by 1 pill each day 02/25/18   Versie Starks, PA-C  QUEtiapine (SEROQUEL) 25 MG tablet Take 25 mg by mouth at bedtime.    [provider]  tiotropium (SPIRIVA) 18 MCG inhalation capsule Place 18 mcg into inhaler and inhale daily.      [provider]  traMADol (ULTRAM) 50 MG tablet Take 1 tablet (50 mg total) by mouth every 6 (six) hours as needed. 02/25/18   Versie Starks, PA-C    Allergies Cefuroxime axetil; Adhesive [tape]; Other; Sulfa antibiotics; and Sulfa drugs cross reactors  History reviewed. No pertinent family history.  Social History Social History   Tobacco Use  . Smoking status: Former Smoker    Years: 55.00    Types: E-cigarettes  . Smokeless tobacco: Never Used  . Tobacco comment: Vapors  Substance Use Topics  . Alcohol use: Yes    Alcohol/week: 7.0 standard drinks    Types: 7 Cans of beer per week    Comment: Beer a night.  . Drug use: No    Review of Systems  Constitutional: No fever/chills, positive for fall with head injury Eyes: No visual changes. ENT: No sore throat. Respiratory: Denies  cough Gastrointestinal: Denies abdominal pain Genitourinary: Negative for dysuria. Musculoskeletal: Negative for back pain.  Positive for neck pain Skin: Negative for rash.    ____________________________________________   PHYSICAL EXAM:  VITAL SIGNS: ED Triage Vitals  Enc Vitals Group     BP 02/25/18 1441 (!) 125/48     Pulse Rate 02/25/18 1441 83     Resp 02/25/18 1441 16     Temp 02/25/18 1441 98.8 F (37.1 C)     Temp Source 02/25/18 1441 Oral     SpO2 02/25/18 1441 95 %     Weight 02/25/18 1442 180 lb (81.6 kg)     Height --      Head Circumference --      Peak Flow --      Pain Score 02/25/18 1442 10     Pain Loc --  Pain Edu? --      Excl. in [place]? --     Constitutional: Alert and oriented. Well appearing and in no acute distress.  Answers all regions appropriately Eyes: Conjunctivae are normal.  Head: The left side of the base of the skull is tender to palpation Nose: No congestion/rhinnorhea. Mouth/Throat: Mucous membranes are moist.   Neck:  supple no lymphadenopathy noted, cervical tenderness is noted around C5-C6 Cardiovascular: Normal rate, regular rhythm. Heart sounds are normal Respiratory: Normal respiratory effort.  No retractions, lungs c t a  Abd: soft nontender bs normal all 4 quad, no hepatosplenomegaly is noted GU: deferred Musculoskeletal: FROM all extremities, warm and well perfused Neurologic:  Normal speech and language.  Skin:  Skin is warm, dry and intact. No rash noted. Psychiatric: Mood and affect are normal. Speech and behavior are normal.  ____________________________________________   LABS (all labs ordered are listed, but only abnormal results are displayed)  Labs Reviewed - No data to display ____________________________________________   ____________________________________________  RADIOLOGY  CT the head and C-spine are negative for any acute  abnormalities  ____________________________________________   PROCEDURES  Procedure(s) performed: No  Procedures    ____________________________________________   INITIAL IMPRESSION / ASSESSMENT AND PLAN / ED COURSE  Pertinent labs & imaging results that were available during my care of the patient were reviewed by me and considered in my medical decision making (see chart for details).   Patient is a 77 year old female presents emergency department after falling out of the bed on Monday.  This was several days ago.  She has been seeing the chiropractor without any relief of the pain in her neck.  She hit her head on the table and has had neck pain on that side since.  On physical exam the patient is tender along the base of the skull on the left side and the C-spine is tender along C5-C6.  The remainder of the exam is unremarkable.  CT of the head and C-spine were ordered.   CT the head and C-spine are both negative.  CT results were explained to the patient and her daughter.  She was given a prescription for prednisone and tramadol.  She is to follow-up with her regular doctor if not better in 3 to 5 days.  Return emergency department worsening.  They both state they understand will comply.  Due to the densities noted in the apices of the lungs and the patient was a previous smoker she is to follow with her regular doctor with concerns of additional testing.  As part of my medical decision making, I reviewed the following data within the [place] notes reviewed and incorporated, Old chart reviewed, Radiograph reviewed CT of the head and C-spine are negative, Notes from prior ED visits and [place] Controlled Substance Database  ____________________________________________   FINAL CLINICAL IMPRESSION(S) / ED DIAGNOSES  Final diagnoses:  Fall, initial encounter  Acute strain of neck muscle, initial encounter      NEW MEDICATIONS STARTED DURING THIS  VISIT:  Discharge Medication List as of 02/25/2018  4:45 PM    START taking these medications   Details  predniSONE (STERAPRED UNI-PAK 21 TAB) 10 MG (21) TBPK tablet Take 6 pills on day one then decrease by 1 pill each day, Normal         Note:  This document was prepared using Dragon voice recognition software and may include unintentional dictation errors.    Versie Starks, PA-C 02/25/18 1940  Nena Polio, MD 02/26/18 Areta Haber

## 2018-03-07 ENCOUNTER — Other Ambulatory Visit: Payer: Self-pay | Admitting: Oncology

## 2018-03-07 DIAGNOSIS — M311 Thrombotic microangiopathy: Secondary | ICD-10-CM

## 2018-03-07 DIAGNOSIS — M3119 Other thrombotic microangiopathy: Secondary | ICD-10-CM

## 2018-06-04 ENCOUNTER — Other Ambulatory Visit: Payer: Self-pay | Admitting: *Deleted

## 2018-06-04 DIAGNOSIS — M3119 Other thrombotic microangiopathy: Secondary | ICD-10-CM

## 2018-06-04 DIAGNOSIS — M311 Thrombotic microangiopathy: Secondary | ICD-10-CM

## 2018-07-02 ENCOUNTER — Encounter: Payer: Self-pay | Admitting: *Deleted

## 2018-07-17 ENCOUNTER — Other Ambulatory Visit (INDEPENDENT_AMBULATORY_CARE_PROVIDER_SITE_OTHER): Payer: Medicare Other

## 2018-07-17 ENCOUNTER — Other Ambulatory Visit: Payer: Self-pay

## 2018-07-17 DIAGNOSIS — M311 Thrombotic microangiopathy: Secondary | ICD-10-CM | POA: Diagnosis not present

## 2018-07-17 DIAGNOSIS — M3119 Other thrombotic microangiopathy: Secondary | ICD-10-CM

## 2018-07-18 LAB — CBC WITH DIFFERENTIAL/PLATELET
Basophils Absolute: 0.1 10*3/uL (ref 0.0–0.2)
Basos: 1 %
EOS (ABSOLUTE): 0.2 10*3/uL (ref 0.0–0.4)
Eos: 4 %
Hematocrit: 43.4 % (ref 34.0–46.6)
Hemoglobin: 14.8 g/dL (ref 11.1–15.9)
Immature Grans (Abs): 0 10*3/uL (ref 0.0–0.1)
Immature Granulocytes: 1 %
LYMPHS ABS: 1.7 10*3/uL (ref 0.7–3.1)
Lymphs: 30 %
MCH: 31.5 pg (ref 26.6–33.0)
MCHC: 34.1 g/dL (ref 31.5–35.7)
MCV: 92 fL (ref 79–97)
MONOS ABS: 0.6 10*3/uL (ref 0.1–0.9)
Monocytes: 10 %
Neutrophils Absolute: 3.2 10*3/uL (ref 1.4–7.0)
Neutrophils: 54 %
Platelets: 139 10*3/uL — ABNORMAL LOW (ref 150–450)
RBC: 4.7 x10E6/uL (ref 3.77–5.28)
RDW: 13.7 % (ref 11.7–15.4)
WBC: 5.7 10*3/uL (ref 3.4–10.8)

## 2018-07-18 LAB — COMPREHENSIVE METABOLIC PANEL
ALT: 11 IU/L (ref 0–32)
AST: 17 IU/L (ref 0–40)
Albumin/Globulin Ratio: 1.7 (ref 1.2–2.2)
Albumin: 4.3 g/dL (ref 3.7–4.7)
Alkaline Phosphatase: 72 IU/L (ref 39–117)
BUN/Creatinine Ratio: 20 (ref 12–28)
BUN: 19 mg/dL (ref 8–27)
Bilirubin Total: 0.3 mg/dL (ref 0.0–1.2)
CO2: 23 mmol/L (ref 20–29)
Calcium: 9.8 mg/dL (ref 8.7–10.3)
Chloride: 102 mmol/L (ref 96–106)
Creatinine, Ser: 0.97 mg/dL (ref 0.57–1.00)
GFR calc Af Amer: 66 mL/min/{1.73_m2} (ref >59)
GFR calc non Af Amer: 57 mL/min/{1.73_m2} — ABNORMAL LOW (ref >59)
Globulin, Total: 2.6 g/dL (ref 1.5–4.5)
Glucose: 98 mg/dL (ref 65–99)
POTASSIUM: 5.2 mmol/L (ref 3.5–5.2)
Sodium: 143 mmol/L (ref 134–144)
Total Protein: 6.9 g/dL (ref 6.0–8.5)

## 2018-07-18 LAB — RETICULOCYTES: Retic Ct Pct: 1.8 % (ref 0.6–2.6)

## 2018-07-18 LAB — LACTATE DEHYDROGENASE: LDH: 254 IU/L — AB (ref 119–226)

## 2018-07-19 ENCOUNTER — Telehealth: Payer: Self-pay | Admitting: *Deleted

## 2018-07-19 NOTE — Telephone Encounter (Signed)
Pt called / informed "platelets holding at 139,000 stable compared to other results over the last year. Referral made to Dr Benay Spice - you should be getting an appointment soon. " per Dr Beryle Beams. Stated she will see [place] next week.

## 2018-07-19 NOTE — Telephone Encounter (Signed)
-----   Message from Annia Belt, MD sent at 07/18/2018  9:01 AM EDT ----- Call pt: platelets holding at 139,000 stable compared to other results over the last year. Referral made to Dr Benay Spice - you should be getting an appointment soon.

## 2018-07-24 ENCOUNTER — Ambulatory Visit (INDEPENDENT_AMBULATORY_CARE_PROVIDER_SITE_OTHER): Payer: Medicare Other | Admitting: Oncology

## 2018-07-24 ENCOUNTER — Other Ambulatory Visit: Payer: Self-pay

## 2018-07-24 ENCOUNTER — Other Ambulatory Visit: Payer: Self-pay | Admitting: Nurse Practitioner

## 2018-07-24 VITALS — BP 134/49 | HR 88 | Temp 98.2°F | Ht 64.5 in | Wt 174.3 lb

## 2018-07-24 DIAGNOSIS — J449 Chronic obstructive pulmonary disease, unspecified: Secondary | ICD-10-CM

## 2018-07-24 DIAGNOSIS — G8929 Other chronic pain: Secondary | ICD-10-CM | POA: Diagnosis not present

## 2018-07-24 DIAGNOSIS — Z872 Personal history of diseases of the skin and subcutaneous tissue: Secondary | ICD-10-CM

## 2018-07-24 DIAGNOSIS — J431 Panlobular emphysema: Secondary | ICD-10-CM

## 2018-07-24 DIAGNOSIS — Z862 Personal history of diseases of the blood and blood-forming organs and certain disorders involving the immune mechanism: Secondary | ICD-10-CM

## 2018-07-24 DIAGNOSIS — M311 Thrombotic microangiopathy: Secondary | ICD-10-CM

## 2018-07-24 DIAGNOSIS — N200 Calculus of kidney: Secondary | ICD-10-CM

## 2018-07-24 DIAGNOSIS — Z8679 Personal history of other diseases of the circulatory system: Secondary | ICD-10-CM

## 2018-07-24 DIAGNOSIS — Z91048 Other nonmedicinal substance allergy status: Secondary | ICD-10-CM

## 2018-07-24 DIAGNOSIS — F17210 Nicotine dependence, cigarettes, uncomplicated: Secondary | ICD-10-CM

## 2018-07-24 DIAGNOSIS — Z9289 Personal history of other medical treatment: Secondary | ICD-10-CM

## 2018-07-24 DIAGNOSIS — F17290 Nicotine dependence, other tobacco product, uncomplicated: Secondary | ICD-10-CM

## 2018-07-24 DIAGNOSIS — M3119 Other thrombotic microangiopathy: Secondary | ICD-10-CM

## 2018-07-24 DIAGNOSIS — Z79899 Other long term (current) drug therapy: Secondary | ICD-10-CM

## 2018-07-24 DIAGNOSIS — Z881 Allergy status to other antibiotic agents status: Secondary | ICD-10-CM

## 2018-07-24 DIAGNOSIS — M47819 Spondylosis without myelopathy or radiculopathy, site unspecified: Secondary | ICD-10-CM | POA: Diagnosis not present

## 2018-07-24 DIAGNOSIS — Z8619 Personal history of other infectious and parasitic diseases: Secondary | ICD-10-CM

## 2018-07-24 MED ORDER — AZATHIOPRINE 50 MG PO TABS: 100.0000 mg | ORAL_TABLET | Freq: Every day | ORAL | 6 refills | Status: DC

## 2018-07-24 NOTE — Patient Instructions (Signed)
Referral made to Dr Benay Spice at [place] [facility] for your ongoing Hematology care. [place]! It has been a pleasure working with you

## 2018-07-24 NOTE — Progress Notes (Signed)
Hematology and Oncology Follow Up Visit  [name] [id number] 08/18/1940 78 y.o. 07/24/2018 1:32 PM   Principle Diagnosis: Encounter Diagnoses  Name Primary?  . TTP (thrombotic thrombocytopenic purpura) (HCC) Yes  . History of plasmapheresis   . Panlobular emphysema ([place])   Clinical summary: 78 year-old lady with multiply relapsed TTP. Initial diagnosis January 2005. Induced into remission withplasma exchange and steroids then Rituxan consolidation. Second relapse October 2011 treated with plasma exchange, steroids, and Rituxan. Third relapse March 2015. Treated with plasma exchange, steroids, Rituxan, and currently on maintenance immunosuppression with azathioprine 100 mg daily. Last plasma exchange given on 08/29/2013. She had a persistent unexplained elevation of her LDH through June 2015 which eventually came down to normal. Additional medical problems include: 1.  Obstructive airway disease from tobacco abuse 2.  Status post emergency L1 through L4 laminectomy for spontaneous epidural abscess.  Blood cultures grew enterococcus.  Associated endocarditis. 3.  Admission 2 for culture-negative sepsis associated with viral URI , April 2015 4.  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  Nonobstructing renal stones  Interim History: She is doing well.  She denies any neurologic symptoms and specifically no headache, double vision, blurry vision, slurred speech, paresthesias, or focal weakness. She is trying to cut down on her cigarette smoking but still smoking and supplementing with vaping.  I warned her of the health risks of both. She got through the winter with just a minor cold.  Since last visit she did get the herpes zoster vaccine. She is enjoying her 47-year-old grandson now.  Medications: reviewed  Allergies:  Allergies  Allergen Reactions  . Cefuroxime Axetil Other (See Comments)    "jittery"  . Adhesive [Tape] Other (See Comments)   Blisters, band-aide brand   . Other Other (See Comments)    Wool: Reaction is hives [employer] tape: Reaction is blistering   . Sulfa Antibiotics Hives  . Sulfa Drugs Cross Reactors Other (See Comments)    unknown    Review of Systems: See interim history Remaining ROS negative:   Physical Exam: Blood pressure (!) 134/49, pulse 88, temperature 98.2 F (36.8 C), temperature source Oral, height 5' 4.5" (1.638 m), weight 174 lb 4.8 oz (79.1 kg), SpO2 97 %. Wt Readings from Last 3 Encounters:  07/24/18 174 lb 4.8 oz (79.1 kg)  02/25/18 180 lb (81.6 kg)  12/18/17 176 lb (79.8 kg)     General appearance: Well-nourished Caucasian woman HENNT: Pharynx no erythema, exudate, mass, or ulcer. No thyromegaly or thyroid nodules Lymph nodes: No cervical, supraclavicular, or axillary lymphadenopathy Breasts:  Lungs: Clear to auscultation, resonant to percussion throughout Heart: Regular rhythm, no murmur, no gallop, no rub, no click, no edema Abdomen: Soft, nontender, normal bowel sounds, no mass, no organomegaly Extremities: No edema, no calf tenderness Musculoskeletal: no joint deformities GU:  Vascular: Carotid pulses 2+, no bruits, distal pulses: Dorsalis pedis 1+ symmetric Neurologic: Alert, oriented, PERRLA, cranial nerves grossly normal, motor strength 5 over 5, reflexes 1+ symmetric, upper body coordination normal, gait normal, Skin: No rash or ecchymosis  Lab Results: CBC W/Diff    Component Value Date/Time   WBC 5.7 07/17/2018 1134   WBC 8.4 04/08/2017 2155   RBC 4.70 07/17/2018 1134   RBC 4.40 04/08/2017 2155   HGB 14.8 07/17/2018 1134   HGB 12.6 08/02/2013 1008   HCT 43.4 07/17/2018 1134   HCT 38.8 08/02/2013 1008   PLT 139 (L) 07/17/2018 1134   MCV 92 07/17/2018  1134   MCV 97.2 08/02/2013 1008   MCH 31.5 07/17/2018 1134   MCH 32.0 04/08/2017 2155   MCHC 34.1 07/17/2018 1134   MCHC 34.3 04/08/2017 2155   RDW 13.7 07/17/2018 1134   RDW 14.8 (H)  08/02/2013 1008   LYMPHSABS 1.7 07/17/2018 1134   LYMPHSABS 0.2 (L) 08/02/2013 1008   MONOABS 0.5 05/17/2016 1109   MONOABS 0.7 08/02/2013 1008   EOSABS 0.2 07/17/2018 1134   EOSABS 0.3 09/13/2012 1702   EOSABS 0.1 11/09/2007 1100   BASOSABS 0.1 07/17/2018 1134   BASOSABS 0.0 08/02/2013 1008     Chemistry      Component Value Date/Time   NA 143 07/17/2018 1134   NA 140 07/26/2013 1020   K 5.2 07/17/2018 1134   K 4.6 07/26/2013 1020   CL 102 07/17/2018 1134   CL 108 (H) 09/13/2012 1702   CL 107 05/22/2012 1023   CO2 23 07/17/2018 1134   CO2 22 07/26/2013 1020   BUN 19 07/17/2018 1134   BUN 35.1 (H) 07/26/2013 1020   CREATININE 0.97 07/17/2018 1134   CREATININE 1.07 07/22/2014 1113   CREATININE 0.9 07/26/2013 1020      Component Value Date/Time   CALCIUM 9.8 07/17/2018 1134   CALCIUM 8.7 07/26/2013 1020   ALKPHOS 72 07/17/2018 1134   ALKPHOS 67 07/26/2013 1020   AST 17 07/17/2018 1134   AST 15 07/26/2013 1020   ALT 11 07/17/2018 1134   ALT 27 07/26/2013 1020   BILITOT 0.3 07/17/2018 1134   BILITOT 0.40 07/26/2013 1020       Radiological Studies: No results found.  Impression:  1.  TTP in third remission Now out 15 years from initial diagnosis, 9 years from first relapse, and almost 5 years from second relapse.  Platelet count fluctuating but over the last 2 years greater than or equal to 125,000.  Most recent value 139,000 on July 17, 2018. She continues on Imuran 100 mg daily. Hemoglobin, bilirubin, normal.  Mild elevation of LDH.  Normal renal and liver function on azathioprine. Plan: Continue Imuran.  Monitor CBC, LDH, reticulocyte count, and chemistry profile every 4 months.  [name].  2.  COPD  3.  Refractory tobacco abuse  4.  History of epidural abscess requiring emergency laminectomy  5.  Enterococcal sepsis and endocarditis secondary to #4.  6.  Advanced degenerative disc  disease of the spine with associated chronic back pain  CC: Patient Care Team: Kirk Ruths, MD as PCP - General (Unknown Physician Specialty) [place], [place]  Murriel Hopper, MD, [place]  Hematology-Oncology/Internal Medicine     3/24/20201:32 PM

## 2018-07-26 ENCOUNTER — Telehealth: Payer: Self-pay | Admitting: Oncology

## 2018-07-26 NOTE — Telephone Encounter (Signed)
Scheduled appt per 3/24 sch message - unable to reach patient . Sent reminder letter in the mail and left message for patient with appt date and time

## 2018-10-30 ENCOUNTER — Telehealth: Payer: Self-pay | Admitting: *Deleted

## 2018-10-30 NOTE — Telephone Encounter (Signed)
Has chronic back pain with recent flare of pain. PCP ordered Tramadol 50 mg (she is taking 25 mg every 6 hours as needed) and this helps. Asking if OK to take Tramadol and if there is anything else on the market to take that is not a narcotic or NSAID. She reports MD told her not to take ibuprofen or acetaminophen. Prior patient of Dr. Beryle Beams seeing Dr. 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 She is on Imuran 100 mg daily.

## 2018-10-31 NOTE — Telephone Encounter (Signed)
Per Dr. Benay Spice: Madaline Brilliant to continue tramadol prn and Tylenol should be OK to take prn from his standpoint. There are no other options he is aware of that are not NSAIDS or a narcotic. Left this info on her mobile VM and reminded of appointment on 11/23/18

## 2018-11-26 ENCOUNTER — Other Ambulatory Visit: Payer: Self-pay

## 2018-11-26 ENCOUNTER — Telehealth: Payer: Self-pay | Admitting: Oncology

## 2018-11-26 ENCOUNTER — Inpatient Hospital Stay: Payer: Medicare Other | Attending: Oncology | Admitting: Oncology

## 2018-11-26 ENCOUNTER — Inpatient Hospital Stay: Payer: Medicare Other

## 2018-11-26 VITALS — BP 128/59 | HR 89 | Temp 98.5°F | Resp 18 | Ht 64.5 in | Wt 168.3 lb

## 2018-11-26 DIAGNOSIS — F17210 Nicotine dependence, cigarettes, uncomplicated: Secondary | ICD-10-CM | POA: Diagnosis not present

## 2018-11-26 DIAGNOSIS — M545 Low back pain: Secondary | ICD-10-CM | POA: Insufficient documentation

## 2018-11-26 DIAGNOSIS — Z79899 Other long term (current) drug therapy: Secondary | ICD-10-CM | POA: Diagnosis not present

## 2018-11-26 DIAGNOSIS — Z862 Personal history of diseases of the blood and blood-forming organs and certain disorders involving the immune mechanism: Secondary | ICD-10-CM

## 2018-11-26 DIAGNOSIS — J449 Chronic obstructive pulmonary disease, unspecified: Secondary | ICD-10-CM | POA: Diagnosis not present

## 2018-11-26 DIAGNOSIS — G8929 Other chronic pain: Secondary | ICD-10-CM | POA: Insufficient documentation

## 2018-11-26 LAB — CMP (CANCER CENTER ONLY)
ALT: 10 U/L (ref 0–44)
AST: 15 U/L (ref 15–41)
Albumin: 3.8 g/dL (ref 3.5–5.0)
Alkaline Phosphatase: 62 U/L (ref 38–126)
Anion gap: 9 (ref 5–15)
BUN: 21 mg/dL (ref 8–23)
CO2: 27 mmol/L (ref 22–32)
Calcium: 11.3 mg/dL — ABNORMAL HIGH (ref 8.9–10.3)
Chloride: 104 mmol/L (ref 98–111)
Creatinine: 1.11 mg/dL — ABNORMAL HIGH (ref 0.44–1.00)
GFR, Est AFR Am: 56 mL/min — ABNORMAL LOW (ref >60)
GFR, Estimated: 48 mL/min — ABNORMAL LOW (ref >60)
Glucose, Bld: 87 mg/dL (ref 70–99)
Potassium: 4.6 mmol/L (ref 3.5–5.1)
Sodium: 140 mmol/L (ref 135–145)
Total Bilirubin: 0.4 mg/dL (ref 0.3–1.2)
Total Protein: 7.4 g/dL (ref 6.5–8.1)

## 2018-11-26 LAB — CBC WITH DIFFERENTIAL (CANCER CENTER ONLY)
Abs Immature Granulocytes: 0.02 10*3/uL (ref 0.00–0.07)
Basophils Absolute: 0.1 10*3/uL (ref 0.0–0.1)
Basophils Relative: 1 %
Eosinophils Absolute: 0.2 10*3/uL (ref 0.0–0.5)
Eosinophils Relative: 3 %
HCT: 44.7 % (ref 36.0–46.0)
Hemoglobin: 14.8 g/dL (ref 12.0–15.0)
Immature Granulocytes: 0 %
Lymphocytes Relative: 26 %
Lymphs Abs: 1.8 10*3/uL (ref 0.7–4.0)
MCH: 31.6 pg (ref 26.0–34.0)
MCHC: 33.1 g/dL (ref 30.0–36.0)
MCV: 95.5 fL (ref 80.0–100.0)
Monocytes Absolute: 0.7 10*3/uL (ref 0.1–1.0)
Monocytes Relative: 10 %
Neutro Abs: 4.2 10*3/uL (ref 1.7–7.7)
Neutrophils Relative %: 60 %
Platelet Count: 189 10*3/uL (ref 150–400)
RBC: 4.68 MIL/uL (ref 3.87–5.11)
RDW: 13.2 % (ref 11.5–15.5)
WBC Count: 6.9 10*3/uL (ref 4.0–10.5)
nRBC: 0 % (ref 0.0–0.2)

## 2018-11-26 LAB — RETICULOCYTES
Immature Retic Fract: 15.1 % (ref 2.3–15.9)
RBC.: 4.67 MIL/uL (ref 3.87–5.11)
Retic Count, Absolute: 69.1 10*3/uL (ref 19.0–186.0)
Retic Ct Pct: 1.5 % (ref 0.4–3.1)

## 2018-11-26 LAB — LACTATE DEHYDROGENASE: LDH: 227 U/L — ABNORMAL HIGH (ref 98–192)

## 2018-11-26 NOTE — Telephone Encounter (Signed)
Gave avs and calendar ° °

## 2018-11-26 NOTE — Progress Notes (Signed)
[facility] New Patient Consult   Requesting MD: Kirk Ruths, Md [place] [facility],  [place] [address]   [name] 78 y.o.  1940-07-04    Reason for Consult: TTP, multiply relapsed   HPI: Ms.Bodin has been followed by Dr. Beryle Beams since being diagnosed with TTP in 2005.  She was initially treated with plasmapheresis, steroids, and rituximab consolidation.  She relapsed in 2011 and was treated similarly.  She had a third relapse in March 2015 treated with plasma exchange, steroids, rituximab, and then azathioprine maintenance therapy.  She continues azathioprine. She complains of chronic low back pain.  She would like a prescription for tramadol to use as needed.  Past Medical History:  Diagnosis Date  . Abnormal laboratory test 09/10/2013   Persistent elevation LDH  . Acute delirium   . Anemia   . Arthritis   . Bacteremia 03/2011  . Blood dyscrasia   . Blood transfusion   . Bronchitis, chronic obstructive w acute bronchitis ([place]) 08/12/2013  . Chronic back pain   . COPD (chronic obstructive pulmonary disease) ([place])   . Depression    "mild"  . 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 History of TTP (thrombotic thrombocytopenic purpura)   . Hypokalemia 08/08/2013   Steroid related  . Normal echocardiogram 03/15/11  . Oral thrush 08/08/2013  . Septicemia 03/2011  . Spinal stenosis, lumbar   . Tobacco abuse 05/10/2013    Past Surgical History:  Procedure Laterality Date  . CATARACT EXTRACTION W/ INTRAOCULAR LENS  IMPLANT, BILATERAL  ~ 2010  . CESAREAN SECTION  10/09/1976  . EYE SURGERY    . INSERTION OF DIALYSIS CATHETER Left 07/12/2013   Procedure: INSERTION OF DIALYSIS CATHETER   with Ultrasound;  Surgeon: Rosetta Posner, MD;  Location: [facility];  Service: Vascular;  Laterality: Left;  . LUMBAR LAMINECTOMY/DECOMPRESSION MICRODISCECTOMY  03/16/2011   Procedure: LUMBAR  LAMINECTOMY/DECOMPRESSION MICRODISCECTOMY;  Surgeon: Elaina Hoops;  Location: [facility]UR[facility];  Service: Neurosurgery;  Laterality: N/A;  . TEE WITHOUT CARDIOVERSION  03/21/2011   Procedure: TRANSESOPHAGEAL ECHOCARDIOGRAM (TEE);  Surgeon: Laverda Page;  Location: [facility] ENDOSCOPY;  Service: Cardiovascular;  Laterality: N/A;  TEE for vegetations  . TONSILLECTOMY    . TUBAL LIGATION      Medications: Reviewed  Allergies:  Allergies  Allergen Reactions  . Cefuroxime Axetil Other (See Comments)    "jittery"  . Adhesive [Tape] Other (See Comments)    Blisters, band-aide brand   . Other Other (See Comments)    Wool: Reaction is hives [employer] tape: Reaction is blistering   . Sulfa Antibiotics Hives  . Sulfa Drugs Cross Reactors Other (See Comments)    unknown    Family history: Her father died of "cancer ".  Social History:   She lives with her husband, daughter, and grandchild in [place].  She smokes 1 pack of cigarettes per day.  She drinks 1 beer per night.  She has received red cells transfusions in the past, she drinks 1 beer each night  ROS:   Positives include: Low back pain  A complete ROS was otherwise negative.  Physical Exam:  Blood pressure (!) 128/59, pulse 89, temperature 98.5 F (36.9 C), temperature source Oral, resp. rate 18, height 5' 4.5" (1.638 m), weight 168 lb 4.8 oz (76.3 kg), SpO2 97 %.  HEENT: Neck without mass Lungs: Scattered end inspiratory rales,  no respiratory distress Cardiac: Regular rate and rhythm, 2/6 diastolic murmur Abdomen: No hepatosplenomegaly  Vascular: No leg edema Lymph nodes: No cervical, supraclavicular, axillary, or inguinal nodes Neurologic: Alert and oriented Musculoskeletal: No spine tenderness   LAB:  CBC  Lab Results  Component Value Date   WBC 6.9 11/26/2018   HGB 14.8 11/26/2018   HCT 44.7 11/26/2018   MCV 95.5 11/26/2018   PLT 189 11/26/2018   NEUTROABS 4.2 11/26/2018        CMP   Lab Results  Component Value Date   NA 143 07/17/2018   K 5.2 07/17/2018   CL 102 07/17/2018   CO2 23 07/17/2018   GLUCOSE 98 07/17/2018   BUN 19 07/17/2018   CREATININE 0.97 07/17/2018   CALCIUM 9.8 07/17/2018   PROT 6.9 07/17/2018   ALBUMIN 4.3 07/17/2018   AST 17 07/17/2018   ALT 11 07/17/2018   ALKPHOS 72 07/17/2018   BILITOT 0.3 07/17/2018   GFRNONAA 57 (L) 07/17/2018   GFRAA 66 07/17/2018   LDH 227    Assessment/Plan:   1. TTP diagnosed in 2015, treated with plasma exchange, steroids, and rituximab consolidation  Relapse of TTP October 2011-plasma exchange, steroids, rituximab  Relapse of TTP March 2015-plasma exchange, steroids, rituximab, and maintenance azathioprine 2. History of epidural abscess requiring laminectomy 2012 3. 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  COPD   Disposition:   [name] is in clinical remission from TTP.  She will continue azathioprine.  There is chronic mild elevation of the LDH, not significantly changed today.  The calcium level is elevated today.  She takes a multivitamin with calcium and an additional calcium supplement.  The calcium elevation may be related to a peak blood level following calcium dosing today.  She will hold the calcium supplement and return for a chemistry panel in 2 weeks.  She requested a prescription for tramadol to help with chronic back pain.  I recommended she schedule appointment with Dr. Ouida Sills to discuss pain management.  She will return for an office and lab visit in 6 months.  Betsy Coder, MD  11/26/2018, 2:59 PM

## 2019-01-10 ENCOUNTER — Inpatient Hospital Stay: Payer: Medicare Other | Attending: Oncology

## 2019-01-10 ENCOUNTER — Other Ambulatory Visit: Payer: Self-pay

## 2019-01-10 DIAGNOSIS — Z862 Personal history of diseases of the blood and blood-forming organs and certain disorders involving the immune mechanism: Secondary | ICD-10-CM | POA: Insufficient documentation

## 2019-01-10 LAB — CMP (CANCER CENTER ONLY)
ALT: 10 U/L (ref 0–44)
AST: 17 U/L (ref 15–41)
Albumin: 4.2 g/dL (ref 3.5–5.0)
Alkaline Phosphatase: 64 U/L (ref 38–126)
Anion gap: 8 (ref 5–15)
BUN: 25 mg/dL — ABNORMAL HIGH (ref 8–23)
CO2: 29 mmol/L (ref 22–32)
Calcium: 9.4 mg/dL (ref 8.9–10.3)
Chloride: 105 mmol/L (ref 98–111)
Creatinine: 1.31 mg/dL — ABNORMAL HIGH (ref 0.44–1.00)
GFR, Est AFR Am: 46 mL/min — ABNORMAL LOW (ref >60)
GFR, Estimated: 39 mL/min — ABNORMAL LOW (ref >60)
Glucose, Bld: 97 mg/dL (ref 70–99)
Potassium: 5.1 mmol/L (ref 3.5–5.1)
Sodium: 142 mmol/L (ref 135–145)
Total Bilirubin: 0.4 mg/dL (ref 0.3–1.2)
Total Protein: 7.1 g/dL (ref 6.5–8.1)

## 2019-01-11 ENCOUNTER — Encounter: Payer: Self-pay | Admitting: Oncology

## 2019-01-11 ENCOUNTER — Telehealth: Payer: Self-pay

## 2019-01-11 ENCOUNTER — Encounter: Payer: Self-pay | Admitting: *Deleted

## 2019-01-11 NOTE — Telephone Encounter (Signed)
TC to patient per Dr. Benay Spice to let her know that her calcium level is normal, follow-up as scheduled. Pt verbalized understanding. No further problems or concerns at this time.

## 2019-05-27 ENCOUNTER — Inpatient Hospital Stay: Payer: Medicare Other

## 2019-05-27 ENCOUNTER — Inpatient Hospital Stay: Payer: Medicare Other | Attending: Oncology | Admitting: Oncology

## 2019-05-27 ENCOUNTER — Other Ambulatory Visit: Payer: Self-pay

## 2019-05-27 VITALS — BP 142/59 | HR 74 | Temp 98.4°F | Resp 20 | Ht 64.5 in | Wt 167.0 lb

## 2019-05-27 DIAGNOSIS — Z862 Personal history of diseases of the blood and blood-forming organs and certain disorders involving the immune mechanism: Secondary | ICD-10-CM

## 2019-05-27 LAB — CBC WITH DIFFERENTIAL (CANCER CENTER ONLY)
Abs Immature Granulocytes: 0.02 10*3/uL (ref 0.00–0.07)
Basophils Absolute: 0.1 10*3/uL (ref 0.0–0.1)
Basophils Relative: 1 %
Eosinophils Absolute: 0.2 10*3/uL (ref 0.0–0.5)
Eosinophils Relative: 3 %
HCT: 47.1 % — ABNORMAL HIGH (ref 36.0–46.0)
Hemoglobin: 15.6 g/dL — ABNORMAL HIGH (ref 12.0–15.0)
Immature Granulocytes: 0 %
Lymphocytes Relative: 29 %
Lymphs Abs: 1.8 10*3/uL (ref 0.7–4.0)
MCH: 32.2 pg (ref 26.0–34.0)
MCHC: 33.1 g/dL (ref 30.0–36.0)
MCV: 97.3 fL (ref 80.0–100.0)
Monocytes Absolute: 0.6 10*3/uL (ref 0.1–1.0)
Monocytes Relative: 10 %
Neutro Abs: 3.5 10*3/uL (ref 1.7–7.7)
Neutrophils Relative %: 57 %
Platelet Count: 121 10*3/uL — ABNORMAL LOW (ref 150–400)
RBC: 4.84 MIL/uL (ref 3.87–5.11)
RDW: 13.6 % (ref 11.5–15.5)
WBC Count: 6.1 10*3/uL (ref 4.0–10.5)
nRBC: 0 % (ref 0.0–0.2)

## 2019-05-27 LAB — CMP (CANCER CENTER ONLY)
ALT: 14 U/L (ref 0–44)
AST: 20 U/L (ref 15–41)
Albumin: 4.4 g/dL (ref 3.5–5.0)
Alkaline Phosphatase: 68 U/L (ref 38–126)
Anion gap: 8 (ref 5–15)
BUN: 24 mg/dL — ABNORMAL HIGH (ref 8–23)
CO2: 28 mmol/L (ref 22–32)
Calcium: 9.8 mg/dL (ref 8.9–10.3)
Chloride: 105 mmol/L (ref 98–111)
Creatinine: 1.17 mg/dL — ABNORMAL HIGH (ref 0.44–1.00)
GFR, Est AFR Am: 52 mL/min — ABNORMAL LOW (ref >60)
GFR, Estimated: 45 mL/min — ABNORMAL LOW (ref >60)
Glucose, Bld: 101 mg/dL — ABNORMAL HIGH (ref 70–99)
Potassium: 5 mmol/L (ref 3.5–5.1)
Sodium: 141 mmol/L (ref 135–145)
Total Bilirubin: 0.4 mg/dL (ref 0.3–1.2)
Total Protein: 7.5 g/dL (ref 6.5–8.1)

## 2019-05-27 NOTE — Progress Notes (Signed)
  [place] OFFICE VISIT PROGRESS NOTE  I connected with [name] on 05/27/19 at  3:00 PM EST by video and verified that I am speaking with the correct person using two identifiers.   I discussed the limitations, risks, security and privacy concerns of performing an evaluation and management service by telemedicine and the availability of in-person appointments. I also discussed with the patient that there may be a patient responsible charge related to this service. The patient expressed understanding and agreed to proceed.   Patient's location: Office Provider's location: Home    Diagnosis: TTP  INTERVAL HISTORY:   [name] is seen for a scheduled visit.  She feels well.  No bleeding or symptom of thrombosis.  No fever.  No complaint.  Objective:  Vital signs in last 24 hours:  Blood pressure (!) 142/59, pulse 74, temperature 98.4 F (36.9 C), temperature source Temporal, resp. rate 20, height 5' 4.5" (1.638 m), weight 167 lb (75.8 kg), SpO2 97 %.     Lab Results:  Lab Results  Component Value Date   WBC 6.1 05/27/2019   HGB 15.6 (H) 05/27/2019   HCT 47.1 (H) 05/27/2019   MCV 97.3 05/27/2019   PLT 121 (L) 05/27/2019   NEUTROABS 3.5 05/27/2019   Medications: I have reviewed the patient's current medications.  Assessment/Plan: 1. TTP diagnosed in 2015, treated with plasma exchange, steroids, and rituximab consolidation  Relapse of TTP October 2011-plasma exchange, steroids, rituximab  Relapse of TTP March 2015-plasma exchange, steroids, rituximab, and maintenance azathioprine 2. History of epidural abscess requiring laminectomy 2012 3. 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  COPD   Disposition: [name] is in clinical remission from TTP.  She will continue azathioprine.  She has chronic mild thrombocytopenia.  We  will follow up on the chemistry panel and LDH from today.  She will return for an office and lab visit in 6 months. I encouraged [name] to obtain a COVID-19 vaccine.   I discussed the assessment and treatment plan with the patient. The patient was provided an opportunity to ask questions and all were answered. The patient agreed with the plan and demonstrated an understanding of the instructions.   The patient was advised to call back or seek an in-person evaluation if the symptoms worsen or if the condition fails to improve as anticipated.   Betsy Coder ANP/GNP-BC   05/27/2019 3:04 PM

## 2019-05-28 ENCOUNTER — Telehealth: Payer: Self-pay | Admitting: *Deleted

## 2019-05-28 NOTE — Telephone Encounter (Signed)
Left VM for patient to call re: lab results and to confirm that Dr. Frazier Richards is still her PCP.

## 2019-05-28 NOTE — Telephone Encounter (Signed)
-----   Message from Ladell Pier, MD sent at 05/27/2019  3:59 PM EST ----- Please call patient, TTP appears to be in remission, potassium at high end of normal range, looks like she is taking potassium, can probably stop this, please send the chemistry panel to her primary provider with this question

## 2019-05-29 ENCOUNTER — Telehealth: Payer: Self-pay

## 2019-05-29 NOTE — Telephone Encounter (Signed)
TC to pt per Dr Benay Spice to let her know that her TTP appears to be in remission, potassium at high end of normal range, ask if she was taking potassium,she stated that she wasn't. Also faxed over her chemistry panel to her primary provider Frazier Richards (fax# [phone])

## 2019-11-02 ENCOUNTER — Other Ambulatory Visit: Payer: Self-pay

## 2019-11-02 ENCOUNTER — Emergency Department: Payer: Medicare Other

## 2019-11-02 ENCOUNTER — Emergency Department
Admission: EM | Admit: 2019-11-02 | Discharge: 2019-11-02 | Disposition: A | Discharge status: Home / Self Care | Payer: Medicare Other | Attending: Emergency Medicine | Admitting: Emergency Medicine

## 2019-11-02 DIAGNOSIS — J449 Chronic obstructive pulmonary disease, unspecified: Secondary | ICD-10-CM | POA: Diagnosis not present

## 2019-11-02 DIAGNOSIS — R531 Weakness: Secondary | ICD-10-CM | POA: Insufficient documentation

## 2019-11-02 DIAGNOSIS — Z87891 Personal history of nicotine dependence: Secondary | ICD-10-CM | POA: Insufficient documentation

## 2019-11-02 DIAGNOSIS — R251 Tremor, unspecified: Secondary | ICD-10-CM | POA: Diagnosis present

## 2019-11-02 DIAGNOSIS — Z79899 Other long term (current) drug therapy: Secondary | ICD-10-CM | POA: Insufficient documentation

## 2019-11-02 DIAGNOSIS — R29898 Other symptoms and signs involving the musculoskeletal system: Secondary | ICD-10-CM

## 2019-11-02 LAB — COMPREHENSIVE METABOLIC PANEL
ALT: 13 U/L (ref 0–44)
AST: 21 U/L (ref 15–41)
Albumin: 4.1 g/dL (ref 3.5–5.0)
Alkaline Phosphatase: 48 U/L (ref 38–126)
Anion gap: 9 (ref 5–15)
BUN: 22 mg/dL (ref 8–23)
CO2: 28 mmol/L (ref 22–32)
Calcium: 9.4 mg/dL (ref 8.9–10.3)
Chloride: 105 mmol/L (ref 98–111)
Creatinine, Ser: 1.19 mg/dL — ABNORMAL HIGH (ref 0.44–1.00)
GFR calc Af Amer: 51 mL/min — ABNORMAL LOW (ref >60)
GFR calc non Af Amer: 44 mL/min — ABNORMAL LOW (ref >60)
Glucose, Bld: 127 mg/dL — ABNORMAL HIGH (ref 70–99)
Potassium: 4.4 mmol/L (ref 3.5–5.1)
Sodium: 142 mmol/L (ref 135–145)
Total Bilirubin: 0.9 mg/dL (ref 0.3–1.2)
Total Protein: 7.1 g/dL (ref 6.5–8.1)

## 2019-11-02 LAB — CBC WITH DIFFERENTIAL/PLATELET
Abs Immature Granulocytes: 0.03 10*3/uL (ref 0.00–0.07)
Basophils Absolute: 0.1 10*3/uL (ref 0.0–0.1)
Basophils Relative: 1 %
Eosinophils Absolute: 0.2 10*3/uL (ref 0.0–0.5)
Eosinophils Relative: 4 %
HCT: 45.2 % (ref 36.0–46.0)
Hemoglobin: 15.3 g/dL — ABNORMAL HIGH (ref 12.0–15.0)
Immature Granulocytes: 1 %
Lymphocytes Relative: 20 %
Lymphs Abs: 1.2 10*3/uL (ref 0.7–4.0)
MCH: 33 pg (ref 26.0–34.0)
MCHC: 33.8 g/dL (ref 30.0–36.0)
MCV: 97.4 fL (ref 80.0–100.0)
Monocytes Absolute: 0.4 10*3/uL (ref 0.1–1.0)
Monocytes Relative: 8 %
Neutro Abs: 3.8 10*3/uL (ref 1.7–7.7)
Neutrophils Relative %: 66 %
Platelets: 114 10*3/uL — ABNORMAL LOW (ref 150–400)
RBC: 4.64 MIL/uL (ref 3.87–5.11)
RDW: 14.1 % (ref 11.5–15.5)
Smear Review: NORMAL
WBC: 5.7 10*3/uL (ref 4.0–10.5)
nRBC: 0 % (ref 0.0–0.2)

## 2019-11-02 LAB — CK: Total CK: 67 U/L (ref 38–234)

## 2019-11-02 NOTE — ED Provider Notes (Signed)
[facility] Emergency Department Provider Note  ____________________________________________   First MD Initiated Contact with Patient 11/02/19 1304     (approximate)  I have reviewed the triage vital signs and the nursing notes.   HISTORY  Chief Complaint Tremors (left leg )    HPI [name] is a 79 y.o. female with COPD, depression, TTP, TIA who comes in for left leg shaking.  Patient reports starting today she had difficulties with ambulating secondary to her left leg.  She has had about 10 minutes every time she tries to stand up her left leg starts shaking and feels like it is going to give out.  She states that she has never had this happen before.  When she is at rest and laying down she denies any symptoms.  Patient's daughter later came to bedside and gave little bit more clarity.  She states that when she stood up the left leg seem to be shaking and she noted that the left arm had a good grip strength but had a slight pronator drift.  She is also concerned that the tongue might have been deviated to the left.  There is otherwise no other neuro deficits.  She stated that her symptoms seem to have resolved after a few minutes.  She is mostly worried about her TTP being active and want to make sure that labs were drawn versus TIA .  She has had prior back issues but she denies any evidence of cord compression.  No numbness, weakness in the legs, urinary incontinence, rectal tingling.           Past Medical History:  Diagnosis Date  . Abnormal laboratory test 09/10/2013   Persistent elevation LDH  . Acute delirium   . Anemia   . Arthritis   . Bacteremia 03/2011  . Blood dyscrasia   . Blood transfusion   . Bronchitis, chronic obstructive w acute bronchitis ([place]) 08/12/2013  . Chronic back pain   . COPD (chronic obstructive pulmonary disease) ([place])   . Depression    "mild"  . 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 History of TTP (thrombotic thrombocytopenic purpura)   . Hypokalemia 08/08/2013   Steroid related  . Normal echocardiogram 03/15/11  . Oral thrush 08/08/2013  . Septicemia 03/2011  . Spinal stenosis, lumbar   . Tobacco abuse 05/10/2013    Patient Active Problem List   Diagnosis Date Noted  . Abnormal laboratory test 09/10/2013  . Pulmonary edema 08/19/2013  . Bronchitis, chronic obstructive w acute bronchitis ([place]) 08/12/2013  . Fever 08/12/2013  . Oral thrush 08/08/2013  . Hypokalemia 08/08/2013  . History of plasmapheresis 08/08/2013  . 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 Tobacco abuse 05/10/2013  . Enterococcal sepsis ([place]) 06/13/2011  . Endocarditis, bacterial, acute/subacute 06/13/2011  . Gait instability 06/13/2011  . 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 Sepsis(995.91) 03/16/2011  . History of TTP (thrombotic thrombocytopenic purpura) 03/16/2011  . COPD (chronic obstructive pulmonary disease) ([place]) 03/16/2011    Past Surgical History:  Procedure Laterality Date  . CATARACT EXTRACTION W/ INTRAOCULAR LENS  IMPLANT, BILATERAL  ~ 2010  . CESAREAN SECTION  10/09/1976  . EYE SURGERY    . INSERTION OF DIALYSIS CATHETER Left 07/12/2013   Procedure: INSERTION OF DIALYSIS CATHETER   with Ultrasound;  Surgeon: Rosetta Posner, MD;  Location: [facility];  Service: Vascular;  Laterality: Left;  . LUMBAR LAMINECTOMY/DECOMPRESSION MICRODISCECTOMY  03/16/2011  Procedure: LUMBAR LAMINECTOMY/DECOMPRESSION MICRODISCECTOMY;  Surgeon: Elaina Hoops;  Location: [facility];  Service: Neurosurgery;  Laterality: N/A;  . TEE WITHOUT CARDIOVERSION  03/21/2011   Procedure: TRANSESOPHAGEAL ECHOCARDIOGRAM (TEE);  Surgeon: Laverda Page;  Location[facility] ENDOSCOPY;  Service: Cardiovascular;  Laterality: N/A;  TEE for vegetations  . TONSILLECTOMY    . TUBAL LIGATION      Prior to Admission medications   Medication Sig Start Date End Date Taking? Authorizing Provider  azaTHIOprine  (IMURAN) 50 MG tablet Take 2 tablets (100 mg total) by mouth daily. 07/24/18   Annia Belt, MD  cholecalciferol (VITAMIN D) 400 UNITS TABS Take 400 Units by mouth.    [provider]  cyanocobalamin 100 MCG tablet Take 1,000 mcg by mouth daily.    [provider]  FLUoxetine (PROZAC) 20 MG capsule Take 20 mg by mouth daily.     Kirk Ruths, MD  folic acid (FOLVITE) 1 MG tablet Take 1 mg by mouth daily.    [provider]  gabapentin (NEURONTIN) 400 MG capsule Take 400 mg by mouth 3 (three) times daily.    [provider]  Multiple Vitamin (MULTIVITAMIN) tablet Take 1 tablet by mouth daily.     [provider]  polyethylene glycol (MIRALAX / GLYCOLAX) packet Take 17 g by mouth daily. Patient taking differently: Take 17 g by mouth daily as needed.  04/08/17   Earleen Newport, MD  potassium chloride SA (K-DUR,KLOR-CON) 20 MEQ tablet Take 20 mEq by mouth daily.  08/08/13   [provider]  QUEtiapine (SEROQUEL) 25 MG tablet Take 25 mg by mouth at bedtime.    [provider]  tiotropium (SPIRIVA) 18 MCG inhalation capsule Place 18 mcg into inhaler and inhale daily.      [provider]  traMADol (ULTRAM) 50 MG tablet Take 1 tablet (50 mg total) by mouth every 6 (six) hours as needed. Patient not taking: Reported on 11/26/2018 02/25/18   Versie Starks, PA-C    Allergies Cefuroxime axetil, Adhesive [tape], Other, Sulfa antibiotics, and Sulfa drugs cross reactors  No family history on file.  Social History Social History   Tobacco Use  . Smoking status: Former Smoker    Years: 55.00    Types: E-cigarettes  . Smokeless tobacco: Never Used  . Tobacco comment: Vapors  Substance Use Topics  . Alcohol use: Yes    Alcohol/week: 7.0 standard drinks    Types: 7 Cans of beer per week    Comment: Beer a night.  . Drug use: No      Review of Systems Constitutional: No fever/chills Eyes: No visual  changes. ENT: No sore throat. Cardiovascular: Denies chest pain. Respiratory: Denies shortness of breath. Gastrointestinal: No abdominal pain.  No nausea, no vomiting.  No diarrhea.  No constipation. Genitourinary: Negative for dysuria. Musculoskeletal: Negative for back pain. Skin: Negative for rash. Neurological: Positive left leg trembling, possible pronator drift on the left, possible tongue deviation All other ROS negative ____________________________________________   PHYSICAL EXAM:  VITAL SIGNS: ED Triage Vitals  Enc Vitals Group     BP 11/02/19 1258 (!) 129/52     Pulse Rate 11/02/19 1258 76     Resp 11/02/19 1258 18     Temp --      Temp src --      SpO2 11/02/19 1258 99 %     Weight 11/02/19 1259 175 lb (79.4 kg)     Height 11/02/19 1259 5'  4" (1.626 m)     Head Circumference --      Peak Flow --      Pain Score --      Pain Loc --      Pain Edu? --      Excl. in [place]? --     Constitutional: Alert and oriented. Well appearing and in no acute distress. Eyes: Conjunctivae are normal. EOMI. Head: Atraumatic. Nose: No congestion/rhinnorhea. Mouth/Throat: Mucous membranes are moist.   Neck: No stridor. Trachea Midline. FROM Cardiovascular: Normal rate, regular rhythm. Grossly normal heart sounds.  Good peripheral circulation. Respiratory: Normal respiratory effort.  No retractions. Lungs CTAB. Gastrointestinal: Soft and nontender. No distention. No abdominal bruits.  Musculoskeletal: No lower extremity tenderness nor edema.  No joint effusions. Neurologic: Cranial nerves II through XII are intact.  Equal strength in arms and legs.  Equal sensation in arms and legs.  Patient is able to ambulate around the room normally.  Skin:  Skin is warm, dry and intact. No rash noted. Psychiatric: Mood and affect are normal. Speech and behavior are normal. GU: Deferred   ____________________________________________   LABS (all labs ordered are listed, but only abnormal  results are displayed)  Labs Reviewed  CBC WITH DIFFERENTIAL/PLATELET - Abnormal; Notable for the following components:      Result Value   Hemoglobin 15.3 (*)    Platelets 114 (*)    All other components within normal limits  COMPREHENSIVE METABOLIC PANEL - Abnormal; Notable for the following components:   Glucose, Bld 127 (*)    Creatinine, Ser 1.19 (*)    GFR calc non Af Amer 44 (*)    GFR calc Af Amer 51 (*)    All other components within normal limits  CK   ____________________________________________   ED ECG REPORT I, [name], the attending physician, personally viewed and interpreted this ECG.  Normal sinus rate of 74, no ST elevation, T wave inversion in V6, normal intervals ____________________________________________  RADIOLOGY   Official radiology report(s): CT Head Wo Contrast  Result Date: 11/02/2019 CLINICAL DATA:  Status post fall today with a blow to the head. Initial encounter. EXAM: CT HEAD WITHOUT CONTRAST CT CERVICAL SPINE WITHOUT CONTRAST TECHNIQUE: Multidetector CT imaging of the head and cervical spine was performed following the standard protocol without intravenous contrast. Multiplanar CT image reconstructions of the cervical spine were also generated. COMPARISON:  Head and cervical spine CT scans 02/25/2018. FINDINGS: CT HEAD FINDINGS Brain: No evidence of acute infarction, hemorrhage, hydrocephalus, extra-axial collection or mass lesion/mass effect. Mild atrophy and chronic microvascular ischemic disease are unchanged in appearance. Vascular: Atherosclerosis.  No hyperdense vessel. Skull: Intact.  No focal lesion. Sinuses/Orbits: Status post cataract extraction. Otherwise unremarkable. Other: None. CT CERVICAL SPINE FINDINGS Alignment: Trace anterolisthesis C3 on C4 and C4 on C5 is unchanged. Mild reversal of the normal cervical lordosis is also unchanged. Skull base and vertebrae: No acute fracture. No primary bone lesion or focal pathologic process.  Soft tissues and spinal canal: No prevertebral fluid or swelling. No visible canal hematoma. Disc levels: Marked loss of disc space height C5-6 and C6-7 is unchanged. Upper chest: Negative. Other: None. IMPRESSION: No acute abnormality head or cervical spine. Atrophy and chronic microvascular ischemic change. Cervical spondylosis Electronically Signed   By: Inge Rise M.D.   On: 11/02/2019 14:29   CT Cervical Spine Wo Contrast  Result Date: 11/02/2019 CLINICAL DATA:  Status post fall today with a blow to the head. Initial encounter. EXAM:  CT HEAD WITHOUT CONTRAST CT CERVICAL SPINE WITHOUT CONTRAST TECHNIQUE: Multidetector CT imaging of the head and cervical spine was performed following the standard protocol without intravenous contrast. Multiplanar CT image reconstructions of the cervical spine were also generated. COMPARISON:  Head and cervical spine CT scans 02/25/2018. FINDINGS: CT HEAD FINDINGS Brain: No evidence of acute infarction, hemorrhage, hydrocephalus, extra-axial collection or mass lesion/mass effect. Mild atrophy and chronic microvascular ischemic disease are unchanged in appearance. Vascular: Atherosclerosis.  No hyperdense vessel. Skull: Intact.  No focal lesion. Sinuses/Orbits: Status post cataract extraction. Otherwise unremarkable. Other: None. CT CERVICAL SPINE FINDINGS Alignment: Trace anterolisthesis C3 on C4 and C4 on C5 is unchanged. Mild reversal of the normal cervical lordosis is also unchanged. Skull base and vertebrae: No acute fracture. No primary bone lesion or focal pathologic process. Soft tissues and spinal canal: No prevertebral fluid or swelling. No visible canal hematoma. Disc levels: Marked loss of disc space height C5-6 and C6-7 is unchanged. Upper chest: Negative. Other: None. IMPRESSION: No acute abnormality head or cervical spine. Atrophy and chronic microvascular ischemic change. Cervical spondylosis Electronically Signed   By: Inge Rise M.D.   On:  11/02/2019 14:29    ____________________________________________   PROCEDURES  Procedure(s) performed (including Critical Care):  Procedures   ____________________________________________   INITIAL IMPRESSION / ASSESSMENT AND PLAN / ED COURSE  FALLON HAECKER was evaluated in Emergency Department on 11/02/2019 for the symptoms described in the history of present illness. She was evaluated in the context of the global COVID-19 pandemic, which necessitated consideration that the patient might be at risk for infection with the SARS-CoV-2 virus that causes COVID-19. Institutional protocols and algorithms that pertain to the evaluation of patients at risk for COVID-19 are in a state of rapid change based on information released by regulatory bodies including the CDC and federal and state organizations. These policies and algorithms were followed during the patient's care in the ED.     Patient is a well-appearing 79 year old who has a normal NIH stroke scale who comes in with some left-sided leg shaking only that occurred with standing.  However when daughter got there there is also concern for medial little bit of pronator drift in the left arm although no signs of any weakness.  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  Patient does have a history of back surgery but this time she is got good strength and no numbness on that leg.  She denies any symptoms of cord compression.  Do not think we need to do any MRIs given the symptoms have now resolved.  She ambulates around the room normally.  Will get labs also to make sure no signs of electrolyte abnormalities, anemia.  She denies symptoms of UTI.  CT imaging was negative, labs are reassuring.  Family was relieved that there is not signs of flareup of her ITP.    I discussed with patient and the daughter that we typically do admit people for TIA work-ups in order to get echocardiogram, carotid Dopplers, Mri brain and  keep patient on the monitor given their high risk for having another stroke within the next 24 hours.  They stated that due to her ITP that they do not think that they would even consider TPA and she is not any other anti stroke prevention secondary to this.  She has had work-ups previously for TIAs.  They understand the risk and would prefer to go home at this time and to follow-up  with neurology.  The daughter will be with her for the next 24 hours and to return to the ER if she develops recurrent stroke symptoms.  They stated they just want to make sure that it was not a flareup of her ITP. at this time they declined admission and would prefer to go home.  They have a neurologist that they can follow-up with.        ____________________________________________   FINAL CLINICAL IMPRESSION(S) / ED DIAGNOSES   Final diagnoses:  Left leg weakness      MEDICATIONS GIVEN DURING THIS VISIT:  Medications - No data to display   ED Discharge Orders    None       Note:  This document was prepared using Dragon voice recognition software and may include unintentional dictation errors.   Krystal Landa, MD 11/03/19 [phone]

## 2019-11-02 NOTE — Discharge Instructions (Addendum)
It was possible that she had a TIA today.  We offered admission versus going home with following up with her neurology.  You prefer to go home at this time.  Please call them on Tuesday to make a follow-up appointment.  Please return to the ER if she develops any other new neuro symptoms or any other concerns.

## 2019-11-02 NOTE — ED Triage Notes (Signed)
Pt to ED via [facility] from home. Per EMS pt had a tremor in her left leg upon standing lasting about 55mins. Pt stating she fell to the ground and hit her head. Pt denies LOC. Pt VSS with EMS. EMS stroke screen negative.   Pt with hx TIA and TTP. Pt in NAD upon arrival.

## 2019-11-12 ENCOUNTER — Other Ambulatory Visit: Payer: Self-pay | Admitting: Internal Medicine

## 2019-11-12 DIAGNOSIS — M4807 Spinal stenosis, lumbosacral region: Secondary | ICD-10-CM

## 2019-11-25 ENCOUNTER — Other Ambulatory Visit: Payer: Self-pay

## 2019-11-25 ENCOUNTER — Encounter: Payer: Self-pay | Admitting: Nurse Practitioner

## 2019-11-25 ENCOUNTER — Inpatient Hospital Stay: Payer: Medicare Other | Attending: Nurse Practitioner | Admitting: Nurse Practitioner

## 2019-11-25 ENCOUNTER — Inpatient Hospital Stay: Payer: Medicare Other

## 2019-11-25 ENCOUNTER — Telehealth: Payer: Self-pay | Admitting: Nurse Practitioner

## 2019-11-25 VITALS — BP 126/49 | HR 86 | Temp 97.9°F | Resp 18 | Wt 162.9 lb

## 2019-11-25 DIAGNOSIS — G252 Other specified forms of tremor: Secondary | ICD-10-CM | POA: Insufficient documentation

## 2019-11-25 DIAGNOSIS — R7402 Elevation of levels of lactic acid dehydrogenase (LDH): Secondary | ICD-10-CM | POA: Diagnosis not present

## 2019-11-25 DIAGNOSIS — M311 Thrombotic microangiopathy: Secondary | ICD-10-CM | POA: Insufficient documentation

## 2019-11-25 DIAGNOSIS — G8929 Other chronic pain: Secondary | ICD-10-CM | POA: Diagnosis not present

## 2019-11-25 DIAGNOSIS — J449 Chronic obstructive pulmonary disease, unspecified: Secondary | ICD-10-CM | POA: Insufficient documentation

## 2019-11-25 DIAGNOSIS — Z862 Personal history of diseases of the blood and blood-forming organs and certain disorders involving the immune mechanism: Secondary | ICD-10-CM

## 2019-11-25 DIAGNOSIS — Z79899 Other long term (current) drug therapy: Secondary | ICD-10-CM | POA: Insufficient documentation

## 2019-11-25 LAB — CBC WITH DIFFERENTIAL (CANCER CENTER ONLY)
Abs Immature Granulocytes: 0.09 10*3/uL — ABNORMAL HIGH (ref 0.00–0.07)
Basophils Absolute: 0.1 10*3/uL (ref 0.0–0.1)
Basophils Relative: 1 %
Eosinophils Absolute: 0.2 10*3/uL (ref 0.0–0.5)
Eosinophils Relative: 2 %
HCT: 47 % — ABNORMAL HIGH (ref 36.0–46.0)
Hemoglobin: 15.9 g/dL — ABNORMAL HIGH (ref 12.0–15.0)
Immature Granulocytes: 1 %
Lymphocytes Relative: 19 %
Lymphs Abs: 1.5 10*3/uL (ref 0.7–4.0)
MCH: 32.4 pg (ref 26.0–34.0)
MCHC: 33.8 g/dL (ref 30.0–36.0)
MCV: 95.9 fL (ref 80.0–100.0)
Monocytes Absolute: 0.8 10*3/uL (ref 0.1–1.0)
Monocytes Relative: 10 %
Neutro Abs: 5.2 10*3/uL (ref 1.7–7.7)
Neutrophils Relative %: 67 %
Platelet Count: 137 10*3/uL — ABNORMAL LOW (ref 150–400)
RBC: 4.9 MIL/uL (ref 3.87–5.11)
RDW: 14.2 % (ref 11.5–15.5)
WBC Count: 7.8 10*3/uL (ref 4.0–10.5)
nRBC: 0 % (ref 0.0–0.2)

## 2019-11-25 LAB — RETICULOCYTES
Immature Retic Fract: 15.7 % (ref 2.3–15.9)
RBC.: 4.84 MIL/uL (ref 3.87–5.11)
Retic Count, Absolute: 93.4 10*3/uL (ref 19.0–186.0)
Retic Ct Pct: 1.9 % (ref 0.4–3.1)

## 2019-11-25 LAB — CMP (CANCER CENTER ONLY)
ALT: 16 U/L (ref 0–44)
AST: 19 U/L (ref 15–41)
Albumin: 4.1 g/dL (ref 3.5–5.0)
Alkaline Phosphatase: 55 U/L (ref 38–126)
Anion gap: 10 (ref 5–15)
BUN: 20 mg/dL (ref 8–23)
CO2: 25 mmol/L (ref 22–32)
Calcium: 10.7 mg/dL — ABNORMAL HIGH (ref 8.9–10.3)
Chloride: 106 mmol/L (ref 98–111)
Creatinine: 1.21 mg/dL — ABNORMAL HIGH (ref 0.44–1.00)
GFR, Est AFR Am: 50 mL/min — ABNORMAL LOW (ref >60)
GFR, Estimated: 43 mL/min — ABNORMAL LOW (ref >60)
Glucose, Bld: 100 mg/dL — ABNORMAL HIGH (ref 70–99)
Potassium: 4.6 mmol/L (ref 3.5–5.1)
Sodium: 141 mmol/L (ref 135–145)
Total Bilirubin: 0.5 mg/dL (ref 0.3–1.2)
Total Protein: 7.3 g/dL (ref 6.5–8.1)

## 2019-11-25 LAB — LACTATE DEHYDROGENASE: LDH: 240 U/L — ABNORMAL HIGH (ref 98–192)

## 2019-11-25 NOTE — Telephone Encounter (Signed)
Scheduled per 7/26 los. Printed avs and calendar for pt.  

## 2019-11-25 NOTE — Progress Notes (Signed)
  [facility] OFFICE PROGRESS NOTE   Diagnosis: TTP  INTERVAL HISTORY:   [name] returns as scheduled.  She continues Imuran.  Overall she feels well.  No bleeding.  No symptoms of thrombosis.  For the past month or so she has had intermittent tremors of the left leg and periodic falls.  No unusual headaches or vision change.  She was evaluated in the emergency department 11/02/2019.  CT brain and cervical spine with no acute abnormality.  Objective:  Vital signs in last 24 hours:  Blood pressure (!) 126/49, pulse 86, temperature 97.9 F (36.6 C), temperature source Temporal, resp. rate 18, weight 162 lb 14.4 oz (73.9 kg), SpO2 94 %.    Resp: Distant breath sounds.  No respiratory distress. Cardio: Regular rate and rhythm. GI: Abdomen soft and nontender.  No hepatosplenomegaly. Vascular: No leg edema. Neuro: Alert and oriented.  Lower extremity motor strength intact.   Lab Results:  Lab Results  Component Value Date   WBC 7.8 11/25/2019   HGB 15.9 (H) 11/25/2019   HCT 47.0 (H) 11/25/2019   MCV 95.9 11/25/2019   PLT 137 (L) 11/25/2019   NEUTROABS 5.2 11/25/2019    Imaging:  No results found.  Medications: I have reviewed the patient's current medications.  Assessment/Plan: 1. TTP diagnosed in 2015, treated with plasma exchange, steroids, and rituximab consolidation ? 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 Relapse of TTP March 2015-plasma exchange, steroids, rituximab, and maintenance azathioprine 2. History of epidural abscess requiring laminectomy 2012 3. 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  COPD  Disposition: [name] remains in clinical remission from TTP.  She has stable mild chronic elevation of the LDH.  Stable mild thrombocytopenia.  She will continue azathioprine.   Etiology of intermittent left leg tremors, recent falls  unclear.  She has follow-up with Dr. Saintclair Halsted in the near future and plans to discuss this further with him.  She will return for lab and follow-up in 6 months.  Plan reviewed with Dr. Benay Spice.    [name] ANP/GNP-BC   11/25/2019  2:46 PM

## 2019-11-27 ENCOUNTER — Other Ambulatory Visit: Payer: Self-pay

## 2019-11-27 ENCOUNTER — Ambulatory Visit
Admission: RE | Admit: 2019-11-27 | Discharge: 2019-11-27 | Disposition: A | Discharge status: Home / Self Care | Payer: Medicare Other | Source: Ambulatory Visit | Attending: Internal Medicine | Admitting: Internal Medicine

## 2019-11-27 DIAGNOSIS — M4807 Spinal stenosis, lumbosacral region: Secondary | ICD-10-CM | POA: Diagnosis not present

## 2019-12-26 NOTE — Progress Notes (Deleted)
Pt present today as a referral due to having a mass on her left ovary.

## 2019-12-27 ENCOUNTER — Other Ambulatory Visit: Payer: Self-pay

## 2019-12-27 ENCOUNTER — Encounter: Payer: Medicare Other | Admitting: Obstetrics and Gynecology

## 2019-12-27 ENCOUNTER — Telehealth: Payer: Self-pay | Admitting: Obstetrics and Gynecology

## 2019-12-27 NOTE — Telephone Encounter (Signed)
Patient came into the office for her appointment while asking the patient the COVID screening questions she stated that someone in her household had tested positive yesterday. Patient walked in without a mask on and had to be asked to please put one on. Informed patient that she would need to have a negative COVID test before I could get her rescheduled for her appointment. Informed patient we would not be able to see her today due to her being directly exposed to [place].

## 2020-01-09 ENCOUNTER — Encounter: Payer: Self-pay | Admitting: Obstetrics and Gynecology

## 2020-01-31 ENCOUNTER — Ambulatory Visit: Payer: Medicare Other | Admitting: Neurology

## 2020-05-04 ENCOUNTER — Telehealth: Payer: Self-pay | Admitting: Oncology

## 2020-05-04 NOTE — Telephone Encounter (Signed)
Rescheduled appointments on 1/25. Called patient, no answer. Left message with updated appointments date and times.

## 2020-05-26 ENCOUNTER — Ambulatory Visit: Payer: Medicare Other | Admitting: Oncology

## 2020-05-26 ENCOUNTER — Other Ambulatory Visit: Payer: Medicare Other

## 2020-06-08 ENCOUNTER — Inpatient Hospital Stay: Payer: Medicare Other | Admitting: Oncology

## 2020-06-08 ENCOUNTER — Inpatient Hospital Stay: Payer: Medicare Other | Attending: Oncology

## 2020-06-09 ENCOUNTER — Encounter: Payer: Self-pay | Admitting: Nurse Practitioner

## 2020-07-02 ENCOUNTER — Telehealth: Payer: Self-pay | Admitting: Nurse Practitioner

## 2020-07-02 NOTE — Telephone Encounter (Signed)
Rescheduled appointment per 03/02 schedule message. Contacted patient, patient is aware.

## 2020-07-13 ENCOUNTER — Encounter: Payer: Self-pay | Admitting: Nurse Practitioner

## 2020-07-13 ENCOUNTER — Inpatient Hospital Stay (HOSPITAL_BASED_OUTPATIENT_CLINIC_OR_DEPARTMENT_OTHER): Payer: Medicare Other | Admitting: Nurse Practitioner

## 2020-07-13 ENCOUNTER — Inpatient Hospital Stay: Payer: Medicare Other | Attending: Oncology

## 2020-07-13 ENCOUNTER — Other Ambulatory Visit: Payer: Self-pay

## 2020-07-13 VITALS — BP 105/50 | HR 96 | Temp 97.8°F | Resp 18 | Ht 64.0 in | Wt 163.1 lb

## 2020-07-13 DIAGNOSIS — Z79899 Other long term (current) drug therapy: Secondary | ICD-10-CM | POA: Insufficient documentation

## 2020-07-13 DIAGNOSIS — R233 Spontaneous ecchymoses: Secondary | ICD-10-CM | POA: Insufficient documentation

## 2020-07-13 DIAGNOSIS — Z862 Personal history of diseases of the blood and blood-forming organs and certain disorders involving the immune mechanism: Secondary | ICD-10-CM

## 2020-07-13 DIAGNOSIS — M2550 Pain in unspecified joint: Secondary | ICD-10-CM | POA: Insufficient documentation

## 2020-07-13 DIAGNOSIS — G8929 Other chronic pain: Secondary | ICD-10-CM | POA: Insufficient documentation

## 2020-07-13 DIAGNOSIS — M549 Dorsalgia, unspecified: Secondary | ICD-10-CM | POA: Insufficient documentation

## 2020-07-13 DIAGNOSIS — R944 Abnormal results of kidney function studies: Secondary | ICD-10-CM | POA: Insufficient documentation

## 2020-07-13 DIAGNOSIS — J449 Chronic obstructive pulmonary disease, unspecified: Secondary | ICD-10-CM | POA: Insufficient documentation

## 2020-07-13 DIAGNOSIS — R4182 Altered mental status, unspecified: Secondary | ICD-10-CM | POA: Insufficient documentation

## 2020-07-13 DIAGNOSIS — R7402 Elevation of levels of lactic acid dehydrogenase (LDH): Secondary | ICD-10-CM | POA: Insufficient documentation

## 2020-07-13 DIAGNOSIS — R519 Headache, unspecified: Secondary | ICD-10-CM | POA: Insufficient documentation

## 2020-07-13 DIAGNOSIS — M3119 Other thrombotic microangiopathy: Secondary | ICD-10-CM | POA: Insufficient documentation

## 2020-07-13 DIAGNOSIS — R58 Hemorrhage, not elsewhere classified: Secondary | ICD-10-CM | POA: Insufficient documentation

## 2020-07-13 LAB — CMP (CANCER CENTER ONLY)
ALT: 19 U/L (ref 0–44)
AST: 18 U/L (ref 15–41)
Albumin: 3.7 g/dL (ref 3.5–5.0)
Alkaline Phosphatase: 64 U/L (ref 38–126)
Anion gap: 5 (ref 5–15)
BUN: 27 mg/dL — ABNORMAL HIGH (ref 8–23)
CO2: 28 mmol/L (ref 22–32)
Calcium: 9.3 mg/dL (ref 8.9–10.3)
Chloride: 106 mmol/L (ref 98–111)
Creatinine: 1.42 mg/dL — ABNORMAL HIGH (ref 0.44–1.00)
GFR, Estimated: 38 mL/min — ABNORMAL LOW (ref >60)
Glucose, Bld: 99 mg/dL (ref 70–99)
Potassium: 4.8 mmol/L (ref 3.5–5.1)
Sodium: 139 mmol/L (ref 135–145)
Total Bilirubin: 0.5 mg/dL (ref 0.3–1.2)
Total Protein: 7 g/dL (ref 6.5–8.1)

## 2020-07-13 LAB — CBC WITH DIFFERENTIAL (CANCER CENTER ONLY)
Abs Immature Granulocytes: 0.02 10*3/uL (ref 0.00–0.07)
Basophils Absolute: 0 10*3/uL (ref 0.0–0.1)
Basophils Relative: 1 %
Eosinophils Absolute: 0.1 10*3/uL (ref 0.0–0.5)
Eosinophils Relative: 1 %
HCT: 44.7 % (ref 36.0–46.0)
Hemoglobin: 14.7 g/dL (ref 12.0–15.0)
Immature Granulocytes: 0 %
Lymphocytes Relative: 10 %
Lymphs Abs: 0.6 10*3/uL — ABNORMAL LOW (ref 0.7–4.0)
MCH: 32.3 pg (ref 26.0–34.0)
MCHC: 32.9 g/dL (ref 30.0–36.0)
MCV: 98.2 fL (ref 80.0–100.0)
Monocytes Absolute: 0.6 10*3/uL (ref 0.1–1.0)
Monocytes Relative: 10 %
Neutro Abs: 4.6 10*3/uL (ref 1.7–7.7)
Neutrophils Relative %: 78 %
Platelet Count: 81 10*3/uL — ABNORMAL LOW (ref 150–400)
RBC: 4.55 MIL/uL (ref 3.87–5.11)
RDW: 14.6 % (ref 11.5–15.5)
WBC Count: 6 10*3/uL (ref 4.0–10.5)
nRBC: 0 % (ref 0.0–0.2)

## 2020-07-13 LAB — RETICULOCYTES
Immature Retic Fract: 15.3 % (ref 2.3–15.9)
RBC.: 4.51 MIL/uL (ref 3.87–5.11)
Retic Count, Absolute: 73.5 10*3/uL (ref 19.0–186.0)
Retic Ct Pct: 1.6 % (ref 0.4–3.1)

## 2020-07-13 LAB — LACTATE DEHYDROGENASE: LDH: 300 U/L — ABNORMAL HIGH (ref 98–192)

## 2020-07-13 NOTE — Progress Notes (Signed)
  [place] OFFICE PROGRESS NOTE   Diagnosis: TTP  INTERVAL HISTORY:   Ms. Supak returns for follow-up.  She feels well.  No interim illnesses or infections.  No fevers or sweats.  She has a good appetite.  She denies bleeding.  No unusual headaches.  No visual disturbance.  No change in baseline dyspnea and cough which she attributes to smoking.  Objective:  Vital signs in last 24 hours:  Blood pressure (!) 105/50, pulse 96, temperature 97.8 F (36.6 C), temperature source Tympanic, resp. rate 18, height 5\' 4"  (1.626 m), weight 163 lb 1.6 oz (74 kg), SpO2 97 %.    HEENT: Sclera anicteric.  No thrush or ulcers. Lymphatics: No palpable cervical, supraclavicular, axillary or inguinal lymph nodes. Resp: Distant breath sounds.  No respiratory distress. Cardio: Regular rate and rhythm. GI: No hepatosplenomegaly. Vascular: No leg edema. Neuro: Alert and oriented.  Gait normal. Skin: No petechiae.   Lab Results:  Lab Results  Component Value Date   WBC 6.0 07/13/2020   HGB 14.7 07/13/2020   HCT 44.7 07/13/2020   MCV 98.2 07/13/2020   PLT 81 (L) 07/13/2020   NEUTROABS 4.6 07/13/2020    Imaging:  No results found.  Medications: I have reviewed the patient's current medications.  Assessment/Plan: 1. TTP diagnosed in 2005, treated with plasma exchange, steroids, and rituximab consolidation ? 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 Relapse of TTP March 2015-plasma exchange, steroids, rituximab, and maintenance azathioprine 2. History of epidural abscess requiring laminectomy 2012 3. 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 COPD  Disposition: Ms. Huisman appears stable.  She will continue Imuran.  Review of labs from today show progressive thrombocytopenia; hemoglobin, bilirubin, reticulocyte count all in normal range; creatinine slightly  elevated from baseline.  LDH is pending.  I reviewed the above with Dr. Benay Spice.  Ms. Eckles will return for repeat labs at a 6-week interval.  She understands to contact the office in the interim with any problems, specifically bruising/bleeding, fever.  She and her husband expressed understanding.  She continues to smoke.  I offered a referral to the lung cancer screening program.  She is not interested in this at present.  She will return for lab and follow-up in 6 weeks.  We are available to see her sooner if needed.  Plan reviewed with Dr. Benay Spice.    [name] ANP/GNP-BC   07/13/2020  2:46 PM

## 2020-07-14 ENCOUNTER — Telehealth: Payer: Self-pay | Admitting: Oncology

## 2020-07-14 ENCOUNTER — Other Ambulatory Visit: Payer: Self-pay | Admitting: Nurse Practitioner

## 2020-07-14 ENCOUNTER — Telehealth: Payer: Self-pay | Admitting: Nurse Practitioner

## 2020-07-14 DIAGNOSIS — Z862 Personal history of diseases of the blood and blood-forming organs and certain disorders involving the immune mechanism: Secondary | ICD-10-CM

## 2020-07-14 NOTE — Telephone Encounter (Signed)
Scheduled appt per 3/14 los- left message for pt with appt date and time

## 2020-07-14 NOTE — Telephone Encounter (Signed)
Scheduled appt per 3/15 sch msg. Pt. Aware.

## 2020-07-16 ENCOUNTER — Inpatient Hospital Stay: Payer: Medicare Other

## 2020-07-16 ENCOUNTER — Inpatient Hospital Stay (HOSPITAL_COMMUNITY): Payer: Medicare Other

## 2020-07-16 ENCOUNTER — Other Ambulatory Visit: Payer: Self-pay

## 2020-07-16 ENCOUNTER — Telehealth: Payer: Self-pay | Admitting: *Deleted

## 2020-07-16 ENCOUNTER — Inpatient Hospital Stay (HOSPITAL_BASED_OUTPATIENT_CLINIC_OR_DEPARTMENT_OTHER): Payer: Medicare Other | Admitting: Oncology

## 2020-07-16 ENCOUNTER — Inpatient Hospital Stay (HOSPITAL_COMMUNITY)
Admission: AD | Admit: 2020-07-16 | Discharge: 2020-07-21 | DRG: 547 | Disposition: A | Discharge status: Home / Self Care | Payer: Medicare Other | Source: Ambulatory Visit | Attending: Internal Medicine | Admitting: Internal Medicine

## 2020-07-16 ENCOUNTER — Other Ambulatory Visit: Payer: Self-pay | Admitting: Nurse Practitioner

## 2020-07-16 VITALS — BP 117/52 | HR 102 | Temp 99.1°F | Resp 18 | Ht 64.0 in | Wt 160.6 lb

## 2020-07-16 DIAGNOSIS — Z862 Personal history of diseases of the blood and blood-forming organs and certain disorders involving the immune mechanism: Secondary | ICD-10-CM

## 2020-07-16 DIAGNOSIS — Z881 Allergy status to other antibiotic agents status: Secondary | ICD-10-CM | POA: Diagnosis not present

## 2020-07-16 DIAGNOSIS — K5900 Constipation, unspecified: Secondary | ICD-10-CM | POA: Diagnosis present

## 2020-07-16 DIAGNOSIS — Z79899 Other long term (current) drug therapy: Secondary | ICD-10-CM

## 2020-07-16 DIAGNOSIS — F32A Depression, unspecified: Secondary | ICD-10-CM | POA: Diagnosis present

## 2020-07-16 DIAGNOSIS — F39 Unspecified mood [affective] disorder: Secondary | ICD-10-CM

## 2020-07-16 DIAGNOSIS — R6889 Other general symptoms and signs: Secondary | ICD-10-CM | POA: Diagnosis not present

## 2020-07-16 DIAGNOSIS — Z882 Allergy status to sulfonamides status: Secondary | ICD-10-CM

## 2020-07-16 DIAGNOSIS — Z91048 Other nonmedicinal substance allergy status: Secondary | ICD-10-CM

## 2020-07-16 DIAGNOSIS — R6883 Chills (without fever): Secondary | ICD-10-CM | POA: Diagnosis not present

## 2020-07-16 DIAGNOSIS — F17200 Nicotine dependence, unspecified, uncomplicated: Secondary | ICD-10-CM | POA: Diagnosis present

## 2020-07-16 DIAGNOSIS — Y848 Other medical procedures as the cause of abnormal reaction of the patient, or of later complication, without mention of misadventure at the time of the procedure: Secondary | ICD-10-CM | POA: Diagnosis not present

## 2020-07-16 DIAGNOSIS — J449 Chronic obstructive pulmonary disease, unspecified: Secondary | ICD-10-CM | POA: Diagnosis present

## 2020-07-16 DIAGNOSIS — Z20822 Contact with and (suspected) exposure to covid-19: Secondary | ICD-10-CM | POA: Diagnosis present

## 2020-07-16 DIAGNOSIS — T8092XA Unspecified transfusion reaction, initial encounter: Secondary | ICD-10-CM | POA: Diagnosis not present

## 2020-07-16 DIAGNOSIS — M549 Dorsalgia, unspecified: Secondary | ICD-10-CM | POA: Diagnosis present

## 2020-07-16 DIAGNOSIS — J441 Chronic obstructive pulmonary disease with (acute) exacerbation: Secondary | ICD-10-CM | POA: Diagnosis present

## 2020-07-16 DIAGNOSIS — E785 Hyperlipidemia, unspecified: Secondary | ICD-10-CM | POA: Diagnosis present

## 2020-07-16 DIAGNOSIS — M3119 Other thrombotic microangiopathy: Secondary | ICD-10-CM | POA: Diagnosis present

## 2020-07-16 DIAGNOSIS — G8929 Other chronic pain: Secondary | ICD-10-CM | POA: Diagnosis present

## 2020-07-16 DIAGNOSIS — G894 Chronic pain syndrome: Secondary | ICD-10-CM | POA: Diagnosis present

## 2020-07-16 DIAGNOSIS — Z720 Tobacco use: Secondary | ICD-10-CM | POA: Diagnosis present

## 2020-07-16 DIAGNOSIS — F17290 Nicotine dependence, other tobacco product, uncomplicated: Secondary | ICD-10-CM | POA: Diagnosis present

## 2020-07-16 DIAGNOSIS — Z9289 Personal history of other medical treatment: Secondary | ICD-10-CM

## 2020-07-16 HISTORY — PX: IR PERC TUN PERIT CATH WO PORT S&I /IMAG: IMG2327

## 2020-07-16 HISTORY — PX: IR US GUIDE VASC ACCESS LEFT: IMG2389

## 2020-07-16 LAB — RETICULOCYTES
Immature Retic Fract: 17.8 % — ABNORMAL HIGH (ref 2.3–15.9)
RBC.: 4.59 MIL/uL (ref 3.87–5.11)
Retic Count, Absolute: 68.9 10*3/uL (ref 19.0–186.0)
Retic Ct Pct: 1.5 % (ref 0.4–3.1)

## 2020-07-16 LAB — CBC WITH DIFFERENTIAL (CANCER CENTER ONLY)
Abs Immature Granulocytes: 0.05 10*3/uL (ref 0.00–0.07)
Basophils Absolute: 0 10*3/uL (ref 0.0–0.1)
Basophils Relative: 1 %
Eosinophils Absolute: 0 10*3/uL (ref 0.0–0.5)
Eosinophils Relative: 0 %
HCT: 44.6 % (ref 36.0–46.0)
Hemoglobin: 14.7 g/dL (ref 12.0–15.0)
Immature Granulocytes: 2 %
Lymphocytes Relative: 10 %
Lymphs Abs: 0.3 10*3/uL — ABNORMAL LOW (ref 0.7–4.0)
MCH: 31.6 pg (ref 26.0–34.0)
MCHC: 33 g/dL (ref 30.0–36.0)
MCV: 95.9 fL (ref 80.0–100.0)
Monocytes Absolute: 0.2 10*3/uL (ref 0.1–1.0)
Monocytes Relative: 7 %
Neutro Abs: 2.8 10*3/uL (ref 1.7–7.7)
Neutrophils Relative %: 80 %
Platelet Count: 17 10*3/uL — ABNORMAL LOW (ref 150–400)
RBC: 4.65 MIL/uL (ref 3.87–5.11)
RDW: 14.7 % (ref 11.5–15.5)
WBC Count: 3.4 10*3/uL — ABNORMAL LOW (ref 4.0–10.5)
nRBC: 0 % (ref 0.0–0.2)

## 2020-07-16 LAB — PROTIME-INR
INR: 1.1 (ref 0.8–1.2)
Prothrombin Time: 14.1 seconds (ref 11.4–15.2)

## 2020-07-16 LAB — TYPE AND SCREEN
ABO/RH(D): O POS
Antibody Screen: NEGATIVE

## 2020-07-16 LAB — CMP (CANCER CENTER ONLY)
ALT: 32 U/L (ref 0–44)
AST: 48 U/L — ABNORMAL HIGH (ref 15–41)
Albumin: 3.6 g/dL (ref 3.5–5.0)
Alkaline Phosphatase: 51 U/L (ref 38–126)
Anion gap: 8 (ref 5–15)
BUN: 26 mg/dL — ABNORMAL HIGH (ref 8–23)
CO2: 27 mmol/L (ref 22–32)
Calcium: 9 mg/dL (ref 8.9–10.3)
Chloride: 102 mmol/L (ref 98–111)
Creatinine: 1.52 mg/dL — ABNORMAL HIGH (ref 0.44–1.00)
GFR, Estimated: 35 mL/min — ABNORMAL LOW (ref >60)
Glucose, Bld: 195 mg/dL — ABNORMAL HIGH (ref 70–99)
Potassium: 4.5 mmol/L (ref 3.5–5.1)
Sodium: 137 mmol/L (ref 135–145)
Total Bilirubin: 0.9 mg/dL (ref 0.3–1.2)
Total Protein: 6.8 g/dL (ref 6.5–8.1)

## 2020-07-16 LAB — FIBRINOGEN: Fibrinogen: 495 mg/dL — ABNORMAL HIGH (ref 210–475)

## 2020-07-16 LAB — APTT: aPTT: 35 seconds (ref 24–36)

## 2020-07-16 LAB — LACTATE DEHYDROGENASE: LDH: 531 U/L — ABNORMAL HIGH (ref 98–192)

## 2020-07-16 LAB — SAVE SMEAR(SSMR), FOR PROVIDER SLIDE REVIEW

## 2020-07-16 MED ORDER — MORPHINE SULFATE (PF) 2 MG/ML IV SOLN: 2.0000 mg | INTRAVENOUS | Status: DC | PRN

## 2020-07-16 MED ORDER — DOCUSATE SODIUM 100 MG PO CAPS
100.0000 mg | ORAL_CAPSULE | Freq: Two times a day (BID) | ORAL | Status: DC
  Administered 2020-07-17 – 2020-07-21 (×8): 100 mg via ORAL
  Filled 2020-07-16 (×9): qty 1

## 2020-07-16 MED ORDER — GABAPENTIN 300 MG PO CAPS
300.0000 mg | ORAL_CAPSULE | Freq: Two times a day (BID) | ORAL | Status: DC
  Administered 2020-07-16 – 2020-07-21 (×9): 300 mg via ORAL
  Filled 2020-07-16 (×9): qty 1

## 2020-07-16 MED ORDER — ALBUTEROL SULFATE (2.5 MG/3ML) 0.083% IN NEBU: 2.5000 mg | INHALATION_SOLUTION | RESPIRATORY_TRACT | Status: DC | PRN

## 2020-07-16 MED ORDER — HEPARIN SODIUM (PORCINE) 1000 UNIT/ML IJ SOLN
INTRAMUSCULAR | Status: AC
  Filled 2020-07-16: qty 1

## 2020-07-16 MED ORDER — LIDOCAINE HCL 1 % IJ SOLN
INTRAMUSCULAR | Status: DC | PRN
  Administered 2020-07-16: 20 mL

## 2020-07-16 MED ORDER — BISACODYL 5 MG PO TBEC
5.0000 mg | DELAYED_RELEASE_TABLET | Freq: Every day | ORAL | Status: DC | PRN
  Filled 2020-07-16: qty 1

## 2020-07-16 MED ORDER — FLUOXETINE HCL 20 MG PO CAPS
20.0000 mg | ORAL_CAPSULE | Freq: Every day | ORAL | Status: DC
  Administered 2020-07-17 – 2020-07-21 (×4): 20 mg via ORAL
  Filled 2020-07-16 (×5): qty 1

## 2020-07-16 MED ORDER — LACTATED RINGERS IV SOLN: INTRAVENOUS | Status: DC

## 2020-07-16 MED ORDER — LIDOCAINE HCL 1 % IJ SOLN
INTRAMUSCULAR | Status: AC
  Filled 2020-07-16: qty 20

## 2020-07-16 MED ORDER — TRAMADOL HCL 50 MG PO TABS: 50.0000 mg | ORAL_TABLET | Freq: Four times a day (QID) | ORAL | Status: DC | PRN

## 2020-07-16 MED ORDER — TIOTROPIUM BROMIDE MONOHYDRATE 18 MCG IN CAPS: 18.0000 ug | ORAL_CAPSULE | Freq: Every day | RESPIRATORY_TRACT | Status: DC

## 2020-07-16 MED ORDER — ONDANSETRON HCL 4 MG PO TABS
4.0000 mg | ORAL_TABLET | Freq: Four times a day (QID) | ORAL | Status: DC | PRN
  Administered 2020-07-16: 4 mg via ORAL
  Filled 2020-07-16: qty 1

## 2020-07-16 MED ORDER — METHYLPREDNISOLONE SODIUM SUCC 125 MG IJ SOLR
80.0000 mg | Freq: Every day | INTRAMUSCULAR | Status: DC
  Administered 2020-07-16 – 2020-07-20 (×5): 80 mg via INTRAVENOUS
  Filled 2020-07-16 (×4): qty 2

## 2020-07-16 MED ORDER — QUETIAPINE FUMARATE 25 MG PO TABS
25.0000 mg | ORAL_TABLET | Freq: Every day | ORAL | Status: DC
  Administered 2020-07-16 – 2020-07-20 (×5): 25 mg via ORAL
  Filled 2020-07-16 (×5): qty 1

## 2020-07-16 MED ORDER — ROSUVASTATIN CALCIUM 20 MG PO TABS
20.0000 mg | ORAL_TABLET | Freq: Every day | ORAL | Status: DC
  Administered 2020-07-16 – 2020-07-20 (×5): 20 mg via ORAL
  Filled 2020-07-16 (×6): qty 1

## 2020-07-16 MED ORDER — ONDANSETRON HCL 4 MG/2ML IJ SOLN: 4.0000 mg | Freq: Four times a day (QID) | INTRAMUSCULAR | Status: DC | PRN

## 2020-07-16 MED ORDER — ACETAMINOPHEN 650 MG RE SUPP: 650.0000 mg | Freq: Four times a day (QID) | RECTAL | Status: DC | PRN

## 2020-07-16 MED ORDER — HYDRALAZINE HCL 20 MG/ML IJ SOLN: 5.0000 mg | INTRAMUSCULAR | Status: DC | PRN

## 2020-07-16 MED ORDER — GABAPENTIN 400 MG PO CAPS: 400.0000 mg | ORAL_CAPSULE | Freq: Three times a day (TID) | ORAL | Status: DC

## 2020-07-16 MED ORDER — AZATHIOPRINE 50 MG PO TABS: 100.0000 mg | ORAL_TABLET | Freq: Every day | ORAL | Status: DC

## 2020-07-16 MED ORDER — HYDROCODONE-ACETAMINOPHEN 5-325 MG PO TABS: 1.0000 | ORAL_TABLET | ORAL | Status: DC | PRN

## 2020-07-16 MED ORDER — NICOTINE 21 MG/24HR TD PT24
21.0000 mg | MEDICATED_PATCH | Freq: Every day | TRANSDERMAL | Status: DC
  Administered 2020-07-16 – 2020-07-21 (×5): 21 mg via TRANSDERMAL
  Filled 2020-07-16 (×6): qty 1

## 2020-07-16 MED ORDER — SODIUM CHLORIDE 0.9% IV SOLUTION: Freq: Once | INTRAVENOUS | Status: DC

## 2020-07-16 MED ORDER — UMECLIDINIUM BROMIDE 62.5 MCG/INH IN AEPB
1.0000 | INHALATION_SPRAY | Freq: Every day | RESPIRATORY_TRACT | Status: DC
  Administered 2020-07-17 – 2020-07-21 (×4): 1 via RESPIRATORY_TRACT
  Filled 2020-07-16: qty 7

## 2020-07-16 MED ORDER — POLYETHYLENE GLYCOL 3350 17 G PO PACK: 17.0000 g | PACK | Freq: Every day | ORAL | Status: DC | PRN

## 2020-07-16 MED ORDER — SODIUM CHLORIDE 0.9% FLUSH
3.0000 mL | Freq: Two times a day (BID) | INTRAVENOUS | Status: DC
  Administered 2020-07-16 – 2020-07-21 (×7): 3 mL via INTRAVENOUS

## 2020-07-16 MED ORDER — ACETAMINOPHEN 325 MG PO TABS
650.0000 mg | ORAL_TABLET | Freq: Four times a day (QID) | ORAL | Status: DC | PRN
  Administered 2020-07-20: 650 mg via ORAL
  Filled 2020-07-16: qty 2

## 2020-07-16 NOTE — Consult Note (Signed)
   Patient Status: [facility] - In-pt  Assessment and Plan: Patient in need of venous access for plasmapheresis due to relapse of TTP Discussed with [name], NP.  Patient to initiate plasmapheresis this evening.   Temporary catheter is sufficient for current needs.  Spoke with daughter over the phone and patient in IR.  All in agreement to proceed with plasmapheresis catheter placement.  ______________________________________________________________________   History of Present Illness: [name] is a 80 y.o. 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  She presented to Hematology today with nausea, vomiting, headache, joint pain presumably due to thrombocytopenia and TTP relapse.  IR consulted for plasmapheresis catheter placement.   Allergies and medications reviewed.   Review of Systems: A 12 point ROS discussed and pertinent positives are indicated in the HPI above.  All other systems are negative.  Review of Systems  Constitutional: Negative for fatigue and fever.  Respiratory: Negative for cough and shortness of breath.   Cardiovascular: Negative for chest pain.  Gastrointestinal: Positive for nausea and vomiting. Negative for abdominal pain.  Musculoskeletal: Positive for arthralgias.  Psychiatric/Behavioral: Negative for behavioral problems and confusion.    Vital Signs: BP (!) 115/56 (BP Location: Left Arm)   Pulse 98   Temp 99.3 F (37.4 C) (Oral)   Resp 18   SpO2 95%   Physical Exam Vitals and nursing note reviewed.  Constitutional:      General: She is not in acute distress.    Appearance: Normal appearance. She is not ill-appearing.  HENT:     Mouth/Throat:     Mouth: Mucous membranes are moist.     Pharynx: Oropharynx is clear.  Cardiovascular:     Rate and Rhythm: Normal rate.  Pulmonary:     Effort: Pulmonary effort is normal.  Musculoskeletal:        General: Normal range  of motion.     Cervical back: Normal range of motion and neck supple.  Skin:    General: Skin is warm and dry.  Neurological:     General: No focal deficit present.     Mental Status: She is alert and oriented to person, place, and time. Mental status is at baseline.  Psychiatric:        Mood and Affect: Mood normal.        Behavior: Behavior normal.        Thought Content: Thought content normal.        Judgment: Judgment normal.      Imaging reviewed.   Labs:  COAGS: No results for input(s): INR, APTT in the last 8760 hours.  BMP: Recent Labs    11/02/19 1302 11/25/19 1349 07/13/20 1423 07/16/20 1027  NA 142 141 139 137  K 4.4 4.6 4.8 4.5  CL 105 106 106 102  CO2 28 25 28 27   GLUCOSE 127* 100* 99 195*  BUN 22 20 27* 26*  CALCIUM 9.4 10.7* 9.3 9.0  CREATININE 1.19* 1.21* 1.42* 1.52*  GFRNONAA 44* 43* 38* 35*  GFRAA 51* 50*  --   --        Electronically Signed: Docia Barrier, PA 07/16/2020, 4:53 PM   I spent a total of 10 minutes in face to face in clinical consultation, greater than 50% of which was counseling/coordinating care for venous access.

## 2020-07-16 NOTE — H&P (Addendum)
History and Physical    MAJESTY STEHLIN LTJ:[id number] DOB: 01/11/1941 DOA: 07/16/2020  PCP: Kirk Ruths, MD Consultants:  Benay Spice - oncology Patient coming from:  Home - lives with husband, daughter and her husband; NOK: Husband or daughter  Chief Complaint: N/V, ataxia  HPI: [name] is a 80 y.o. 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  Her daughter reports that she has had 5 prior bouts of TTP.  She lost taste/smell last week - this has happened with prior episodes but they also repeatedly tested for COVID.  She felt better and then in the last few days developed fine petechiae on her abdomen; headache; ataxia (listing to the left); and diffuse large joint pains.  She did have some mild LE bruising.  They suspected TTP recurrence and so went to see hematology today.  On prior episodes, she usually needs hospitalization for 72 hours or so and then outpatient phoresis.  She smokes 2-3 ppd and drinks 1 beer per night.    ED Course:  Direct admission from Dr. Benay Spice -  Concern for recurrent TTP.  Needs emergent plasmapheresis.  Dr. Benay Spice to coordinate with nephrology.   Review of Systems: As per HPI; otherwise review of systems reviewed and negative.     Past Medical History:  Diagnosis Date  . Abnormal laboratory test 09/10/2013   Persistent elevation LDH  . Acute delirium   . Anemia   . Arthritis   . Bacteremia 03/2011  . Blood dyscrasia   . Blood transfusion   . Bronchitis, chronic obstructive w acute bronchitis ([place]) 08/12/2013  . Chronic back pain   . COPD (chronic obstructive pulmonary disease) ([place])   . Depression    "mild"  . 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 History of TTP (thrombotic thrombocytopenic purpura)   . Hypokalemia 08/08/2013   Steroid related  . Normal  echocardiogram 03/15/11  . Oral thrush 08/08/2013  . Septicemia 03/2011  . Spinal stenosis, lumbar   . Tobacco abuse 05/10/2013    Past Surgical History:  Procedure Laterality Date  . CATARACT EXTRACTION W/ INTRAOCULAR LENS  IMPLANT, BILATERAL  ~ 2010  . CESAREAN SECTION  10/09/1976  . EYE SURGERY    . INSERTION OF DIALYSIS CATHETER Left 07/12/2013   Procedure: INSERTION OF DIALYSIS CATHETER   with Ultrasound;  Surgeon: Rosetta Posner, MD;  Location: [facility];  Service: Vascular;  Laterality: Left;  . LUMBAR LAMINECTOMY/DECOMPRESSION MICRODISCECTOMY  03/16/2011   Procedure: LUMBAR LAMINECTOMY/DECOMPRESSION MICRODISCECTOMY;  Surgeon: Elaina Hoops;  Location: [facility] NEURO ORS;  Service: Neurosurgery;  Laterality: N/A;  . TEE WITHOUT CARDIOVERSION  03/21/2011   Procedure: TRANSESOPHAGEAL ECHOCARDIOGRAM (TEE);  Surgeon: Laverda Page;  Location[facility] ENDOSCOPY;  Service: Cardiovascular;  Laterality: N/A;  TEE for vegetations  . TONSILLECTOMY    . TUBAL LIGATION      Social History   Socioeconomic History  . Marital status: Married    Spouse name: Not on file  . Number of children: Not on file  . Years of education: Not on file  . Highest education level: Not on file  Occupational History  . Not on file  Tobacco Use  . Smoking status: Former Smoker    Years: 55.00    Types: E-cigarettes  . Smokeless tobacco: Never Used  . Tobacco comment: Vapors  Substance and  Sexual Activity  . Alcohol use: Yes    Alcohol/week: 7.0 standard drinks    Types: 7 Cans of beer per week    Comment: Beer a night.  . Drug use: No  . Sexual activity: Not on file  Other Topics Concern  . Not on file  Social History Narrative  . Not on file   Social Determinants of Health   Financial Resource Strain: Not on file  Food Insecurity: Not on file  Transportation Needs: Not on file  Physical Activity: Not on file  Stress: Not on file  Social Connections: Not on file  Intimate Partner Violence: Not on file     Allergies  Allergen Reactions  . Adhesive [Tape] Other (See Comments)    Blisters, Band-Aids brand rips off the skin- paper tape only!!  . Cefuroxime Axetil Anxiety and Other (See Comments)    Made the patient jittery  . Other Hives and Other (See Comments)    Wool: Reaction is hives [employer] tape: Reaction is blistering   . Sulfa Antibiotics Hives  . Sulfa Drugs Cross Reactors Hives    No family history on file.  Prior to Admission medications   Medication Sig Start Date End Date Taking? Authorizing Provider  azaTHIOprine (IMURAN) 50 MG tablet Take 2 tablets (100 mg total) by mouth daily. 07/24/18   Annia Belt, MD  cholecalciferol (VITAMIN D) 400 UNITS TABS Take 400 Units by mouth.    [provider]  cyanocobalamin 100 MCG tablet Take 1,000 mcg by mouth daily.    [provider]  FLUoxetine (PROZAC) 20 MG capsule Take 20 mg by mouth daily.    Kirk Ruths, MD  folic acid (FOLVITE) 1 MG tablet Take 1 mg by mouth daily.    [provider]  gabapentin (NEURONTIN) 400 MG capsule Take 400 mg by mouth 3 (three) times daily.    [provider]  Multiple Vitamin (MULTIVITAMIN) tablet Take 1 tablet by mouth daily.     [provider]  polyethylene glycol (MIRALAX / GLYCOLAX) packet Take 17 g by mouth daily. Patient taking differently: Take 17 g by mouth daily as needed. 04/08/17   Earleen Newport, MD  potassium chloride SA (K-DUR,KLOR-CON) 20 MEQ tablet Take 20 mEq by mouth daily.  08/08/13   [provider]  QUEtiapine (SEROQUEL) 25 MG tablet Take 25 mg by mouth at bedtime.    [provider]  rosuvastatin (CRESTOR) 20 MG tablet Take 20 mg by mouth at bedtime. 05/22/20   [provider]  tiotropium (SPIRIVA) 18 MCG inhalation capsule Place 18 mcg into inhaler and inhale daily.    [provider]  traMADol (ULTRAM) 50 MG tablet Take 1 tablet (50 mg total) by mouth every  6 (six) hours as needed. 02/25/18   Versie Starks, PA-C    Physical Exam: Vitals:   07/16/20 1506  BP: (!) 115/56  Pulse: 98  Resp: 18  Temp: 99.3 F (37.4 C)  TempSrc: Oral  SpO2: 95%     . General:  Appears calm and comfortable and is in NAD . Eyes:  EOMI, normal lids, iris . ENT:  grossly normal hearing, lips & tongue, mmm; R buccal hematoma with petechiae just above . Neck:  no LAD, masses or thyromegaly . Cardiovascular:  RRR, no m/r/g. No LE edema.  Marland Kitchen Respiratory:   Scattered rhonchi.  Normal respiratory effort. . Abdomen:  soft, NT, ND, NABS . Skin:  Very subtle fine  petechiae on abdomen; mild BLE bruising . Musculoskeletal:  grossly normal tone BUE/BLE, good ROM, no bony abnormality . Psychiatric:  flat mood and affect, speech fluent and appropriate, AOx3 . Neurologic:  CN 2-12 grossly intact, moves all extremities in coordinated fashion    Radiological Exams on Admission: Independently reviewed - see discussion in A/P where applicable  No results found.  EKG: not done   Labs on Admission: I have personally reviewed the available labs and imaging studies at the time of the admission.  Pertinent labs:   Glucose 195 BN 26/Creatinine 1.52/GFR 35 - stable LDH 531; 300 on 3/14 WBC 3.4 Platelets 17; 81 on 3/14; 137 on 7/26   Assessment/Plan Principal Problem:   TTP (thrombotic thrombocytopenic purpura) Active Problems:   COPD (chronic obstructive pulmonary disease) (HCC)   Tobacco abuse   Chronic pain disorder   Mood disorder (HCC)   Recurrent TTP -Patient with multiple prior episodes of TTP requiring plasma exchange, steroids and rituximab -She had recurrent recent symptoms and platelets have declined from -She was sent for direct admission due to concern for recurrent TTP -At the time of my evaluation, she was ready to be taken to IR for catheter placement -Dr. Benay Spice is coordinating plasmapheresis and directing care -She is chronically on  immunosuppressive therapy with Imuran; will hold per Dr. Benay Spice  COPD with ongoing tobacco dependence -Continue Spiriva -Add prn Albuterol nebs -Tobacco Dependence: encourage cessation; this was discussed with the patient and should be reviewed on an ongoing basis.   -Patch ordered at patient request.  H/o epidural abscess, enterococcal sepsis, endocarditis -This was in 2012 after laminectomy -No current evidence of infection  Chronic back pain -I have reviewed this patient in the [place] Controlled Substances Reporting System.  She is receiving medications from only one provider and appears to be taking them as prescribed. -She is not at particularly high risk of opioid misuse, diversion, or overdose. -Continue Ultram  HLD -Continue Crestor  Mood disorder -Continue Prozac, Seroquel     Note: This patient has been tested and is pending for the novel coronavirus COVID-19.    Level of care: Progressive Progressive DVT prophylaxis: SCDs Code Status:  Full - confirmed with patient/family Family Communication: Daughter was present throughout evaluation. Disposition Plan:  The patient is from: home  Anticipated d/c is to: home without [facility] services  Anticipated d/c date will depend on clinical response to treatment, likely at least 72 hours  Patient is currently: acutely ill Consults called: Hematology; IR; Nutrition Admission status:  Admit - It is my clinical opinion that admission to INPATIENT is reasonable and necessary because of the expectation that this patient will require hospital care that crosses at least 2 midnights to treat this condition based on the medical complexity of the problems presented.  Given the aforementioned information, the predictability of an adverse outcome is felt to be significant.    Karmen Bongo MD [facility]   How to contact the [facility] Attending or Consulting provider O[place] or covering provider during after hours [place], for this  patient?  1. Check the care team in [facility] and look for a) attending/consulting [facility] provider listed and b) the [facility] team listed 2. Log into [email or web address] and use [place] universal password to access. If you do not have the password, please contact the hospital operator. 3. Locate the [facility] provider you are looking for under [facility] and page to a number that you can be directly reached. 4. If you  still have difficulty reaching the provider, please page the [facility] (Director on Call) for the Hospitalists listed on amion for assistance.   07/16/2020, 6:21 PM

## 2020-07-16 NOTE — Telephone Encounter (Signed)
Daughter called to report patient developed general malaise and taste alterations on 07/13/20. Vomited on 3/15 and has joint pain in shoulders and elbows (unususal for her). She is weaker and having difficulty getting around the house. Also has a headache.  Home covid test 3/14 pm and 3/15 was negative and today's test was negative as well. Instructed daughter to bring her now for lab/OV (she is being worked in). Scheduling notified.

## 2020-07-16 NOTE — Procedures (Signed)
Interventional Radiology Procedure:   Indications: TTP and needs plasmapheresis  Procedure: Placement of non-tunneled pheresis catheter  Findings: Right IJ was patent but unable to advance wire centrally.  Left IJ access obtained.  24 cm catheter with tip at SVC/RA junction.  Complications: None     EBL: less than 10 ml  Plan: Pheresis catheter is ready to use.    Krystal Older R. Anselm Pancoast, MD  Pager: [phone]

## 2020-07-16 NOTE — Progress Notes (Addendum)
[place] OFFICE PROGRESS NOTE   Diagnosis: TTP  INTERVAL HISTORY:   [name] returns prior to scheduled follow-up due to concern for relapse of TTP.  She is accompanied to today's visit by her daughter.  She had an episode of nausea/vomiting 2 days ago.  She woke up with a headache this morning.  Daughter notes her gait is "off".  She is having pain in multiple joints which is not typical for her.  No visual disturbance.  No fever.  No change in baseline cough and dyspnea.  No bleeding.  No diarrhea.  No urinary symptoms.  Objective:  Vital signs in last 24 hours:  Blood pressure (!) 117/52, pulse (!) 102, temperature 99.1 F (37.3 C), temperature source Tympanic, resp. rate 18, height 5\' 4"  (1.626 m), weight 160 lb 9.6 oz (72.8 kg), SpO2 98 %.    HEENT: Ecchymosis right posterior buccal mucosa. Resp: Faint rhonchi at both lung bases.  No respiratory distress. Cardio: Regular rate and rhythm.  Faint murmur. GI: Abdomen soft and nontender.  No hepatosplenomegaly. Vascular: No leg edema. Neuro: Alert, mildly confused.  She stated the day was Monday.  Seems slow to respond to questions.  Follows commands.  Motor strength 5/5.  Finger-to-nose intact.  Gait disturbance, seems to be drifting to the right. Skin: Petechiae scattered over abdominal wall.  Ecchymoses at the lower leg bilaterally.   Lab Results:  Lab Results  Component Value Date   WBC 3.4 (L) 07/16/2020   HGB 14.7 07/16/2020   HCT 44.6 07/16/2020   MCV 95.9 07/16/2020   PLT 17 (L) 07/16/2020   NEUTROABS 2.8 07/16/2020    Imaging:  No results found.  Medications: I have reviewed the patient's current medications.  Assessment/Plan: 1. TTP diagnosed in 2005, treated with plasma exchange, steroids, and rituximab consolidation ? 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 Relapse of TTP March 2015-plasma exchange, steroids, rituximab, and maintenance azathioprine 2. History  of epidural abscess requiring laminectomy 2012 3. 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 COPD  Disposition: [name] has a history of multiply relapsed TTP.  She presents today with progressive thrombocytopenia, mental status change.  LDH is elevated, creatinine elevated from baseline, total bilirubin and hemoglobin remain normal.  ADAMTS13 pending. She will be admitted for presumed relapse of TTP, initiate pheresis.   Patient seen with Dr. Benay Spice.  [name] ANP/GNP-BC   07/16/2020  12:07 PM  This was a shared visit with [name].  [name] was interviewed and examined.  She has a history of TTP with multiple relapses, last in 2015.  She has been followed by Dr. Beryle Beams.  She is currently maintained on chronic immunosuppressive therapy with Imuran.  She presents today with a complaint of "aching ", altered mental status, ataxia, and a petechial rash.  She denies fever or recent infection.  She reports feeling similar to when she has had previous episodes of TTP.  The platelet count is lower and the LDH is elevated.  Review of the peripheral blood smear reveals a few Helment cells, but there are not florid changes of microangiopathic anemia.  There are increased band forms.  I recommend proceeding with plasma exchange given the likelihood of recurrent TTP.  Additional evaluation with coagulation studies and ADAMTS13 are pending.  I discussed the case with the medical service.  She will be admitted to the hospitalist service at [facility]  We will consult interventional radiologist for placement of a pheresis catheter.  We will contact the hemodialysis unit to request daily 1 volume plasma exchange beginning today.  I was present for greater than 50% of today's visit.  I performed medical decision making.  Julieanne Manson, MD

## 2020-07-17 DIAGNOSIS — M3119 Other thrombotic microangiopathy: Secondary | ICD-10-CM | POA: Diagnosis not present

## 2020-07-17 LAB — CBC WITH DIFFERENTIAL/PLATELET
Abs Immature Granulocytes: 0.09 10*3/uL — ABNORMAL HIGH (ref 0.00–0.07)
Basophils Absolute: 0 10*3/uL (ref 0.0–0.1)
Basophils Relative: 1 %
Eosinophils Absolute: 0 10*3/uL (ref 0.0–0.5)
Eosinophils Relative: 0 %
HCT: 41 % (ref 36.0–46.0)
Hemoglobin: 13.8 g/dL (ref 12.0–15.0)
Immature Granulocytes: 3 %
Lymphocytes Relative: 7 %
Lymphs Abs: 0.2 10*3/uL — ABNORMAL LOW (ref 0.7–4.0)
MCH: 32.3 pg (ref 26.0–34.0)
MCHC: 33.7 g/dL (ref 30.0–36.0)
MCV: 96 fL (ref 80.0–100.0)
Monocytes Absolute: 0.1 10*3/uL (ref 0.1–1.0)
Monocytes Relative: 4 %
Neutro Abs: 2.3 10*3/uL (ref 1.7–7.7)
Neutrophils Relative %: 85 %
Platelets: 9 10*3/uL — CL (ref 150–400)
RBC: 4.27 MIL/uL (ref 3.87–5.11)
RDW: 14.6 % (ref 11.5–15.5)
WBC: 2.7 10*3/uL — ABNORMAL LOW (ref 4.0–10.5)
nRBC: 0 % (ref 0.0–0.2)

## 2020-07-17 LAB — BPAM FFP
Blood Product Expiration Date: 202203212359
Blood Product Expiration Date: 202203212359
ISSUE DATE / TIME: 202203172048
ISSUE DATE / TIME: 202203172323
Unit Type and Rh: 5100
Unit Type and Rh: 9500

## 2020-07-17 LAB — PREPARE FRESH FROZEN PLASMA

## 2020-07-17 LAB — URINALYSIS, ROUTINE W REFLEX MICROSCOPIC
Bilirubin Urine: NEGATIVE
Glucose, UA: NEGATIVE mg/dL
Hgb urine dipstick: NEGATIVE
Ketones, ur: NEGATIVE mg/dL
Leukocytes,Ua: NEGATIVE
Nitrite: NEGATIVE
Protein, ur: 30 mg/dL — AB
Specific Gravity, Urine: 1.021 (ref 1.005–1.030)
pH: 5 (ref 5.0–8.0)

## 2020-07-17 LAB — COMPREHENSIVE METABOLIC PANEL
ALT: 42 U/L (ref 0–44)
AST: 65 U/L — ABNORMAL HIGH (ref 15–41)
Albumin: 3.4 g/dL — ABNORMAL LOW (ref 3.5–5.0)
Alkaline Phosphatase: 48 U/L (ref 38–126)
Anion gap: 7 (ref 5–15)
BUN: 27 mg/dL — ABNORMAL HIGH (ref 8–23)
CO2: 28 mmol/L (ref 22–32)
Calcium: 8.9 mg/dL (ref 8.9–10.3)
Chloride: 99 mmol/L (ref 98–111)
Creatinine, Ser: 1.54 mg/dL — ABNORMAL HIGH (ref 0.44–1.00)
GFR, Estimated: 34 mL/min — ABNORMAL LOW (ref >60)
Glucose, Bld: 204 mg/dL — ABNORMAL HIGH (ref 70–99)
Potassium: 4.9 mmol/L (ref 3.5–5.1)
Sodium: 134 mmol/L — ABNORMAL LOW (ref 135–145)
Total Bilirubin: 1.4 mg/dL — ABNORMAL HIGH (ref 0.3–1.2)
Total Protein: 6.2 g/dL — ABNORMAL LOW (ref 6.5–8.1)

## 2020-07-17 LAB — SARS CORONAVIRUS 2 (TAT 6-24 HRS): SARS Coronavirus 2: NEGATIVE

## 2020-07-17 LAB — LACTATE DEHYDROGENASE: LDH: 484 U/L — ABNORMAL HIGH (ref 98–192)

## 2020-07-17 MED ORDER — NICOTINE POLACRILEX 2 MG MT GUM
2.0000 mg | CHEWING_GUM | OROMUCOSAL | Status: DC | PRN
  Filled 2020-07-17: qty 1

## 2020-07-17 MED ORDER — ACD FORMULA A 0.73-2.45-2.2 GM/100ML VI SOLN
Status: AC
  Filled 2020-07-17: qty 500

## 2020-07-17 MED ORDER — ACD FORMULA A 0.73-2.45-2.2 GM/100ML VI SOLN: 1000.0000 mL | Status: DC

## 2020-07-17 MED ORDER — CALCIUM CARBONATE ANTACID 500 MG PO CHEW
CHEWABLE_TABLET | ORAL | Status: AC
  Administered 2020-07-17: 400 mg via ORAL
  Filled 2020-07-17: qty 2

## 2020-07-17 MED ORDER — CHLORHEXIDINE GLUCONATE CLOTH 2 % EX PADS
6.0000 | MEDICATED_PAD | Freq: Every day | CUTANEOUS | Status: DC
  Administered 2020-07-17 – 2020-07-20 (×4): 6 via TOPICAL

## 2020-07-17 MED ORDER — CALCIUM CARBONATE ANTACID 500 MG PO CHEW
2.0000 | CHEWABLE_TABLET | ORAL | Status: AC
Start: 2020-07-17 — End: 2020-07-17
  Administered 2020-07-17: 400 mg via ORAL

## 2020-07-17 MED ORDER — ADULT MULTIVITAMIN W/MINERALS CH
1.0000 | ORAL_TABLET | Freq: Every day | ORAL | Status: DC
  Administered 2020-07-17 – 2020-07-21 (×4): 1 via ORAL
  Filled 2020-07-17 (×4): qty 1

## 2020-07-17 MED ORDER — ANTICOAGULANT SODIUM CITRATE 4% (200MG/5ML) IV SOLN
5.0000 mL | Freq: Once | Status: AC
  Administered 2020-07-17: 5 mL
  Filled 2020-07-17: qty 5

## 2020-07-17 MED ORDER — ENSURE ENLIVE PO LIQD
237.0000 mL | Freq: Two times a day (BID) | ORAL | Status: DC
  Administered 2020-07-17: 237 mL via ORAL

## 2020-07-17 MED ORDER — DIPHENHYDRAMINE HCL 25 MG PO CAPS: 25.0000 mg | ORAL_CAPSULE | Freq: Four times a day (QID) | ORAL | Status: DC | PRN

## 2020-07-17 MED ORDER — CALCIUM GLUCONATE-NACL 2-0.675 GM/100ML-% IV SOLN
2.0000 g | Freq: Once | INTRAVENOUS | Status: AC
  Administered 2020-07-17: 2000 mg via INTRAVENOUS
  Filled 2020-07-17: qty 100

## 2020-07-17 MED ORDER — ACETAMINOPHEN 325 MG PO TABS: 650.0000 mg | ORAL_TABLET | ORAL | Status: DC | PRN

## 2020-07-17 MED ORDER — DIPHENHYDRAMINE HCL 25 MG PO CAPS
ORAL_CAPSULE | ORAL | Status: AC
  Administered 2020-07-17: 25 mg via ORAL
  Filled 2020-07-17: qty 1

## 2020-07-17 MED ORDER — ACETAMINOPHEN 325 MG PO TABS
ORAL_TABLET | ORAL | Status: AC
  Administered 2020-07-17: 650 mg via ORAL
  Filled 2020-07-17: qty 2

## 2020-07-17 NOTE — Progress Notes (Addendum)
HEMATOLOGY-ONCOLOGY PROGRESS NOTE  SUBJECTIVE: The patient tells me that she feels much better today.  She wants to go home.  States that the achiness has resolved.  No bleeding reported.  PHYSICAL EXAMINATION:  Vitals:   07/17/20 0816 07/17/20 0851  BP:    Pulse: 69   Resp: 16   Temp:  98.4 F (36.9 C)  SpO2: 92%    There were no vitals filed for this visit.  Intake/Output from previous day: 03/17 0701 - 03/18 0700 In: 57 [Blood:371] Out: -   GENERAL:alert, no distress and comfortable HEENT: Ecchymosis right posterior buccal mucosa. Resp: Faint rhonchi at both lung bases.  No respiratory distress. Cardio: Regular rate and rhythm.  Faint murmur. GI: Abdomen soft and nontender.  No hepatosplenomegaly. Vascular: No leg edema. Neuro:  Alert and oriented x3 Skin: Petechiae scattered over abdominal wall.  Ecchymoses at the lower leg bilaterally. Pheresis catheter without erythema or bleeding  LABORATORY DATA:  I have reviewed the data as listed CMP Latest Ref Rng & Units 07/17/2020 07/16/2020 07/13/2020  Glucose 70 - 99 mg/dL 204(H) 195(H) 99  BUN 8 - 23 mg/dL 27(H) 26(H) 27(H)  Creatinine 0.44 - 1.00 mg/dL 1.54(H) 1.52(H) 1.42(H)  Sodium 135 - 145 mmol/L 134(L) 137 139  Potassium 3.5 - 5.1 mmol/L 4.9 4.5 4.8  Chloride 98 - 111 mmol/L 99 102 106  CO2 22 - 32 mmol/L 28 27 28   Calcium 8.9 - 10.3 mg/dL 8.9 9.0 9.3  Total Protein 6.5 - 8.1 g/dL 6.2(L) 6.8 7.0  Total Bilirubin 0.3 - 1.2 mg/dL 1.4(H) 0.9 0.5  Alkaline Phos 38 - 126 U/L 48 51 64  AST 15 - 41 U/L 65(H) 48(H) 18  ALT 0 - 44 U/L 42 32 19    Lab Results  Component Value Date   WBC 2.7 (L) 07/17/2020   HGB 13.8 07/17/2020   HCT 41.0 07/17/2020   MCV 96.0 07/17/2020   PLT 9 (LL) 07/17/2020   NEUTROABS 2.3 07/17/2020    IR US Guide Vasc Access Left  Result Date: 07/17/2020 INDICATION: 80 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 EXAM: FLUOROSCOPIC AND ULTRASOUND GUIDED PLACEMENT OF A  NON-TUNNELED DIALYSIS CATHETER Physician: Stephan Minister. Henn, MD MEDICATIONS: None ANESTHESIA/SEDATION: None FLUOROSCOPY TIME:  Fluoroscopy Time: 2 minutes, 36 seconds, 6 mGy COMPLICATIONS: None immediate. PROCEDURE: The procedure was explained to the patient. The risks and benefits of the procedure were discussed and the patient's questions were addressed. Informed consent was obtained from the patient. The patient was placed supine on the interventional table. Ultrasound confirmed a patent right internal jugular vein. The right neck was prepped and draped in a sterile fashion. The right neck was anesthetized with 1% lidocaine. Maximal barrier sterile technique was utilized including caps, mask, sterile gowns, sterile gloves, sterile drape, hand hygiene and skin antiseptic. A small incision was made with #11 blade scalpel. A 21 gauge needle directed into the right internal vein with ultrasound guidance. A micropuncture dilator set was placed but the wire would not advance centrally. Although the right internal jugular vein was patent, it was small. Eventually, this access site was abandoned. Ultrasound confirmed a patent left internal jugular vein. Ultrasound image was saved for documentation. Left side of the neck was prepped and draped in sterile fashion. Maximal barrier sterile technique was utilized including caps, mask, sterile gowns, sterile gloves, sterile drape, hand hygiene and skin antiseptic. Skin was anesthetized with 1% lidocaine. A small incision was made. Using ultrasound guidance, 21  gauge needle was directed into the left internal jugular vein. Wire was advanced centrally. Micropuncture dilator set was placed. A J wire was advanced into the SVC. Tract was dilated. A 24 cm Mahurkar catheter was selected. The catheter was advanced over a wire and positioned at the superior cavoatrial junction. Fluoroscopic images were obtained for documentation. Both dialysis lumens were found to aspirate and flush well.  The proper amount of heparin was flushed in both lumens. The central venous lumen was flushed with normal saline. Catheter was sutured to skin. FINDINGS: Catheter tip at the superior cavoatrial junction. IMPRESSION: Successful placement of a left jugular non-tunneled dialysis catheter using ultrasound and fluoroscopic guidance. Electronically Signed   By: Markus Daft M.D.   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 EXAM: FLUOROSCOPIC AND ULTRASOUND GUIDED PLACEMENT OF A NON-TUNNELED DIALYSIS CATHETER Physician: Stephan Minister. Henn, MD MEDICATIONS: None ANESTHESIA/SEDATION: None FLUOROSCOPY TIME:  Fluoroscopy Time: 2 minutes, 36 seconds, 6 mGy COMPLICATIONS: None immediate. PROCEDURE: The procedure was explained to the patient. The risks and benefits of the procedure were discussed and the patient's questions were addressed. Informed consent was obtained from the patient. The patient was placed supine on the interventional table. Ultrasound confirmed a patent right internal jugular vein. The right neck was prepped and draped in a sterile fashion. The right neck was anesthetized with 1% lidocaine. Maximal barrier sterile technique was utilized including caps, mask, sterile gowns, sterile gloves, sterile drape, hand hygiene and skin antiseptic. A small incision was made with #11 blade scalpel. A 21 gauge needle directed into the right internal vein with ultrasound guidance. A micropuncture dilator set was placed but the wire would not advance centrally. Although the right internal jugular vein was patent, it was small. Eventually, this access site was abandoned. Ultrasound confirmed a patent left internal jugular vein. Ultrasound image was saved for documentation. Left side of the neck was prepped and draped in sterile fashion. Maximal barrier sterile technique was utilized including caps,  mask, sterile gowns, sterile gloves, sterile drape, hand hygiene and skin antiseptic. Skin was anesthetized with 1% lidocaine. A small incision was made. Using ultrasound guidance, 21 gauge needle was directed into the left internal jugular vein. Wire was advanced centrally. Micropuncture dilator set was placed. A J wire was advanced into the SVC. Tract was dilated. A 24 cm Mahurkar catheter was selected. The catheter was advanced over a wire and positioned at the superior cavoatrial junction. Fluoroscopic images were obtained for documentation. Both dialysis lumens were found to aspirate and flush well. The proper amount of heparin was flushed in both lumens. The central venous lumen was flushed with normal saline. Catheter was sutured to skin. FINDINGS: Catheter tip at the superior cavoatrial junction. IMPRESSION: Successful placement of a left jugular non-tunneled dialysis catheter using ultrasound and fluoroscopic guidance. Electronically Signed   By: Markus Daft M.D.   On: 07/17/2020 08:02    ASSESSMENT AND PLAN: 1. TTP diagnosed in 2005, treated with plasma exchange, steroids, and rituximab consolidation ? 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 Relapse of TTP March 2015-plasma exchange, steroids, rituximab, and maintenance azathioprine ? Admission with severe thrombocytopenia and elevated LDH 07/16/2020, consistent with relapse of TTP  -Steroids/FFP given 07/16/2020  -Daily plasma exchange beginning 07/17/2020 2. History of epidural abscess requiring laminectomy 2012 3. 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 COPD  Ms. Coldren appears improved.  She underwent plasmapheresis earlier today.  She tolerated the procedure well.  Recommend continuation of daily plasmapheresis.  We will follow up on ADAMTS 13 when results are available.  1.  Continue daily plasmapheresis. 2.  We will follow  up on ADAMTS 13 when results are available.   LOS: 1 day   Mikey Bussing, DNP, AGPCNP-BC, AOCNP 07/17/20 Ms.Brekke was interviewed and examined.  Her daughter was at the bedside.  She tolerated the FFP well.  The platelet count is lower today.  The plan is to initiate daily 1 volume plasma exchange this morning.  I recommend continuing daily plasmapheresis until the platelet count normalizes.  She will continue Solu-Medrol.  We will consider adding rituximab next week.  We will follow up on results from ADAMTS13 testing.  I spoke with the dialysis unit staff today and stressed the need for daily plasmapheresis, including Saturday and Sunday.  Hematology will round on her daily.  I was present for greater than 50% of today's visit.  I performed medical decision making.

## 2020-07-17 NOTE — Progress Notes (Signed)
Initial Nutrition Assessment  DOCUMENTATION CODES:   Not applicable  INTERVENTION:   Ensure Enlive po BID, each supplement provides 350 kcal and 20 grams of protein  MVI with Minerals daily   NUTRITION DIAGNOSIS:   Inadequate oral intake related to decreased appetite,acute illness (altered taste) as evidenced by per patient/family report.   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 PMH includes COPD with ongoing tobacco depndence, chronic back pain, endocarditis, recurrent TTP  3/17 IR placed non-tunneled pheresis cath  Plan for plasmapheresis  Per MD note, pt lost her taste and smell last week. COVID tests negative.   Pt currently on regular diet, no recorded po intake.  Current wt 72.8 kg; weight of 74 kg earlier this month and in late July 2021. Weights prior to this around 79-80 kg  Labs: reviewed Meds: solumedrol, LR at 75 ml/hr, colace  NUTRITION - FOCUSED PHYSICAL EXAM:  Deferred, re-attempt on follow-up  Diet Order:   Diet Order            Diet regular Room service appropriate? Yes; Fluid consistency: Thin  Diet effective now                 EDUCATION NEEDS:   Not appropriate for education at this time  Skin:  Skin Assessment: Reviewed RN Assessment  Last BM:  3/16  Height:   Ht Readings from Last 1 Encounters:  07/16/20 5\' 4"  (1.626 m)    Weight:   Wt Readings from Last 1 Encounters:  07/16/20 72.8 kg    BMI:  There is no height or weight on file to calculate BMI.  Estimated Nutritional Needs:   Kcal:  1800-2000  kcals  Protein:  85-100 g  Fluid:  >/= 1.8 L   Kerman Passey MS, RDN, LDN, CNSC Registered Dietitian III Clinical Nutrition RD Pager and On-Call Pager Number Located in [place]

## 2020-07-17 NOTE — Progress Notes (Signed)
PROGRESS NOTE    ACADIA THAMMAVONG  ION:[id number] DOB: [birth year] DOA: 07/16/2020 PCP: Kirk Ruths, MD    No chief complaint on file.   Brief Narrative:   TABATHA RAZZANO is a 80 y.o. 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  Her daughter reports that she has had 5 prior bouts of TTP.  She lost taste/smell last week - this has happened with prior episodes but they also repeatedly tested for COVID.  She felt better and then in the last few days developed fine petechiae on her abdomen; headache; ataxia (listing to the left); and diffuse large joint pains.  She did have some mild LE bruising.  They suspected TTP recurrence and so went to see hematology today.  On prior episodes, she usually needs hospitalization for 72 hours or so and then outpatient phoresis.  She smokes 2-3 ppd and drinks 1 beer per night.    ED Course:  Direct admission from Dr. Benay Spice -  Concern for recurrent TTP.  Needs emergent plasmapheresis.  Dr. Benay Spice to coordinate with nephrology.  Subjective:  She received plasmapheresis this morning, denies headache, no confusion, no fever, no sign of bleeding Husband at bedside  Assessment & Plan:   Principal Problem:   TTP (thrombotic thrombocytopenic purpura) Active Problems:   COPD (chronic obstructive pulmonary disease) (HCC)   Tobacco abuse   Chronic pain disorder   Mood disorder (HCC)  Recurrent TTP -First diagnosed in 2005, received plasma exchange, steroids and rituximab in the past -She received FFP x2, Solu-Medrol, plasmapheresis day 1 this morning -Management per hematology Dr. Benay Spice -fall precaution, bedrest until platelet number improves  COPD, current smoker Stable, no wheezing, no hypoxia Continue home medication Encourage smoking cessation  History of epidural abscess, Enterococcus  sepsis, endocarditis in 2012 after laminectomy No sign of infection currently  Chronic back pain Appears at baseline, continue Ultram   Mood disorder Continue Prozac and Seroquel     Unresulted Labs (From admission, onward)          Start     Ordered   07/17/20 0500  CBC with Differential  Daily,   R      07/16/20 1627   07/17/20 0500  Comprehensive metabolic panel  Daily,   R      07/16/20 1627   07/17/20 0500  Lactate dehydrogenase  Daily,   R      07/16/20 1627   07/16/20 2009  Urinalysis, Complete w Microscopic Urine, Clean Catch  Once,   R        07/16/20 2008            DVT prophylaxis: Place and maintain sequential compression device Start: 07/17/20 1348 SCDs Start: 07/16/20 1808   Code Status: Full Family Communication: Husband at bedside Disposition:   Status is: Inpatient  Dispo: The patient is from: home               Anticipated d/c is to: home              Anticipated d/c date is: TBD,                 Consultants:   Hematology Dr. Benay Spice  Procedures:   Plasmapheresis  Antimicrobials:   . Anti-infectives (From admission, onward)   None          Objective: Vitals:   07/17/20 1028 07/17/20  1047 07/17/20 1056 07/17/20 1138  BP:  (!) 110/53 (!) 122/53 (!) 127/58  Pulse: 69 68  67  Resp: (!) 22 (!) 24  (!) 23  Temp:    99.2 F (37.3 C)  TempSrc:    Oral  SpO2: (!) 89% (!) 89%  94%    Intake/Output Summary (Last 24 hours) at 07/17/2020 1347 Last data filed at 07/17/2020 0058 Gross per 24 hour  Intake 371 ml  Output -  Net 371 ml   There were no vitals filed for this visit.  Examination:  General exam: calm, NAD Respiratory system: Clear to auscultation. Respiratory effort normal. Cardiovascular system: S1 & S2 heard, RRR.  +precordial murmur ( reports chronic), No pedal edema. Gastrointestinal system: Abdomen is nondistended, soft and nontender. . Normal bowel sounds heard. Central nervous system: Alert and oriented.  No focal neurological deficits. Extremities: Symmetric 5 x 5 power. Skin: No rashes, lesions or ulcers Psychiatry: Judgement and insight appear normal. Mood & affect appropriate.     Data Reviewed: I have personally reviewed following labs and imaging studies  CBC: Recent Labs  Lab 07/13/20 1423 07/16/20 1027 07/17/20 0100  WBC 6.0 3.4* 2.7*  NEUTROABS 4.6 2.8 2.3  HGB 14.7 14.7 13.8  HCT 44.7 44.6 41.0  MCV 98.2 95.9 96.0  PLT 81* 17* 9*    Basic Metabolic Panel: Recent Labs  Lab 07/13/20 1423 07/16/20 1027 07/17/20 0100  NA 139 137 134*  K 4.8 4.5 4.9  CL 106 102 99  CO2 28 27 28   GLUCOSE 99 195* 204*  BUN 27* 26* 27*  CREATININE 1.42* 1.52* 1.54*  CALCIUM 9.3 9.0 8.9    GFR: Estimated Creatinine Clearance: 29.4 mL/min (A) (by C-G formula based on SCr of 1.54 mg/dL (H)).  Liver Function Tests: Recent Labs  Lab 07/13/20 1423 07/16/20 1027 07/17/20 0100  AST 18 48* 65*  ALT 19 32 42  ALKPHOS 64 51 48  BILITOT 0.5 0.9 1.4*  PROT 7.0 6.8 6.2*  ALBUMIN 3.7 3.6 3.4*    CBG: No results for input(s): GLUCAP in the last 168 hours.   Recent Results (from the past 240 hour(s))  SARS CORONAVIRUS 2 (TAT 6-24 HRS) Nasopharyngeal Nasopharyngeal Swab     Status: None   Collection Time: 07/16/20  7:26 PM   Specimen: Nasopharyngeal Swab  Result Value Ref Range Status   SARS Coronavirus 2 NEGATIVE NEGATIVE Final    Comment: (NOTE) SARS-CoV-2 target nucleic acids are NOT DETECTED.  The SARS-CoV-2 RNA is generally detectable in upper and lower respiratory specimens during the acute phase of infection. Negative results do not preclude SARS-CoV-2 infection, do not rule out co-infections with other pathogens, and should not be used as the sole basis for treatment or other patient management decisions. Negative results must be combined with clinical observations, patient history, and epidemiological information. The expected result is Negative.  Fact Sheet for  Patients: SugarRoll.be  Fact Sheet for Healthcare Providers: https://www.woods-mathews.com/  This test is not yet approved or cleared by the [place] FDA and  has been authorized for detection and/or diagnosis of SARS-CoV-2 by FDA under an Emergency Use Authorization (EUA). This EUA will remain  in effect (meaning this test can be used) for the duration of the COVID-19 declaration under Se ction 564(b)(1) of the Act, 21 U.S.C. section 360bbb-3(b)(1), unless the authorization is terminated or revoked sooner.  Performed at [facility], [place] [address], [place], [place] [address]  Radiology Studies: IR US Guide Vasc Access Left  Result Date: 07/17/2020 INDICATION: 80 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 EXAM: FLUOROSCOPIC AND ULTRASOUND GUIDED PLACEMENT OF A NON-TUNNELED DIALYSIS CATHETER Physician: Stephan Minister. Henn, MD MEDICATIONS: None ANESTHESIA/SEDATION: None FLUOROSCOPY TIME:  Fluoroscopy Time: 2 minutes, 36 seconds, 6 mGy COMPLICATIONS: None immediate. PROCEDURE: The procedure was explained to the patient. The risks and benefits of the procedure were discussed and the patient's questions were addressed. Informed consent was obtained from the patient. The patient was placed supine on the interventional table. Ultrasound confirmed a patent right internal jugular vein. The right neck was prepped and draped in a sterile fashion. The right neck was anesthetized with 1% lidocaine. Maximal barrier sterile technique was utilized including caps, mask, sterile gowns, sterile gloves, sterile drape, hand hygiene and skin antiseptic. A small incision was made with #11 blade scalpel. A 21 gauge needle directed into the right internal vein with ultrasound guidance. A micropuncture dilator set was placed but the wire would not advance centrally. Although the right internal jugular vein was patent, it was small.  Eventually, this access site was abandoned. Ultrasound confirmed a patent left internal jugular vein. Ultrasound image was saved for documentation. Left side of the neck was prepped and draped in sterile fashion. Maximal barrier sterile technique was utilized including caps, mask, sterile gowns, sterile gloves, sterile drape, hand hygiene and skin antiseptic. Skin was anesthetized with 1% lidocaine. A small incision was made. Using ultrasound guidance, 21 gauge needle was directed into the left internal jugular vein. Wire was advanced centrally. Micropuncture dilator set was placed. A J wire was advanced into the SVC. Tract was dilated. A 24 cm Mahurkar catheter was selected. The catheter was advanced over a wire and positioned at the superior cavoatrial junction. Fluoroscopic images were obtained for documentation. Both dialysis lumens were found to aspirate and flush well. The proper amount of heparin was flushed in both lumens. The central venous lumen was flushed with normal saline. Catheter was sutured to skin. FINDINGS: Catheter tip at the superior cavoatrial junction. IMPRESSION: Successful placement of a left jugular non-tunneled dialysis catheter using ultrasound and fluoroscopic guidance. Electronically Signed   By: Markus Daft M.D.   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 EXAM: FLUOROSCOPIC AND ULTRASOUND GUIDED PLACEMENT OF A NON-TUNNELED DIALYSIS CATHETER Physician: Stephan Minister. Henn, MD MEDICATIONS: None ANESTHESIA/SEDATION: None FLUOROSCOPY TIME:  Fluoroscopy Time: 2 minutes, 36 seconds, 6 mGy COMPLICATIONS: None immediate. PROCEDURE: The procedure was explained to the patient. The risks and benefits of the procedure were discussed and the patient's questions were addressed. Informed consent was obtained from the patient. The patient was placed supine on the  interventional table. Ultrasound confirmed a patent right internal jugular vein. The right neck was prepped and draped in a sterile fashion. The right neck was anesthetized with 1% lidocaine. Maximal barrier sterile technique was utilized including caps, mask, sterile gowns, sterile gloves, sterile drape, hand hygiene and skin antiseptic. A small incision was made with #11 blade scalpel. A 21 gauge needle directed into the right internal vein with ultrasound guidance. A micropuncture dilator set was placed but the wire would not advance centrally. Although the right internal jugular vein was patent, it was small. Eventually, this access site was abandoned. Ultrasound confirmed a patent left internal jugular vein. Ultrasound image was saved for documentation. Left side of  the neck was prepped and draped in sterile fashion. Maximal barrier sterile technique was utilized including caps, mask, sterile gowns, sterile gloves, sterile drape, hand hygiene and skin antiseptic. Skin was anesthetized with 1% lidocaine. A small incision was made. Using ultrasound guidance, 21 gauge needle was directed into the left internal jugular vein. Wire was advanced centrally. Micropuncture dilator set was placed. A J wire was advanced into the SVC. Tract was dilated. A 24 cm Mahurkar catheter was selected. The catheter was advanced over a wire and positioned at the superior cavoatrial junction. Fluoroscopic images were obtained for documentation. Both dialysis lumens were found to aspirate and flush well. The proper amount of heparin was flushed in both lumens. The central venous lumen was flushed with normal saline. Catheter was sutured to skin. FINDINGS: Catheter tip at the superior cavoatrial junction. IMPRESSION: Successful placement of a left jugular non-tunneled dialysis catheter using ultrasound and fluoroscopic guidance. Electronically Signed   By: Markus Daft M.D.   On: 07/17/2020 08:02        Scheduled Meds: . sodium  chloride   Intravenous Once  . Chlorhexidine Gluconate Cloth  6 each Topical Q0600  . docusate sodium  100 mg Oral BID  . FLUoxetine  20 mg Oral Daily  . gabapentin  300 mg Oral BID  . methylPREDNISolone (SOLU-MEDROL) injection  80 mg Intravenous Daily  . nicotine  21 mg Transdermal Daily  . QUEtiapine  25 mg Oral QHS  . rosuvastatin  20 mg Oral QHS  . sodium chloride flush  3 mL Intravenous Q12H  . umeclidinium bromide  1 puff Inhalation Daily   Continuous Infusions: . citrate dextrose    . lactated ringers 75 mL/hr at 07/17/20 0110     LOS: 1 day   Time spent: 2mins Greater than 50% of this time was spent in counseling, explanation of diagnosis, planning of further management, and coordination of care.   Voice Recognition Viviann Spare dictation system was used to create this note, attempts have been made to correct errors. Please contact the author with questions and/or clarifications.   Florencia Reasons, MD PhD FACP [facility]  Available via Epic secure chat 7am-7pm for nonurgent issues Please page for urgent issues To page the attending provider between 7A-7P or the covering provider during after hours 7P-7A, please log into the web site [email or web address] and access using universal [place] password for that web site. If you do not have the password, please call the hospital operator.    07/17/2020, 1:47 PM

## 2020-07-18 DIAGNOSIS — M3119 Other thrombotic microangiopathy: Secondary | ICD-10-CM | POA: Diagnosis not present

## 2020-07-18 LAB — CBC WITH DIFFERENTIAL/PLATELET
Abs Immature Granulocytes: 0.06 10*3/uL (ref 0.00–0.07)
Basophils Absolute: 0 10*3/uL (ref 0.0–0.1)
Basophils Relative: 0 %
Eosinophils Absolute: 0 10*3/uL (ref 0.0–0.5)
Eosinophils Relative: 0 %
HCT: 38.3 % (ref 36.0–46.0)
Hemoglobin: 13.1 g/dL (ref 12.0–15.0)
Immature Granulocytes: 2 %
Lymphocytes Relative: 9 %
Lymphs Abs: 0.4 10*3/uL — ABNORMAL LOW (ref 0.7–4.0)
MCH: 32.3 pg (ref 26.0–34.0)
MCHC: 34.2 g/dL (ref 30.0–36.0)
MCV: 94.6 fL (ref 80.0–100.0)
Monocytes Absolute: 0.3 10*3/uL (ref 0.1–1.0)
Monocytes Relative: 9 %
Neutro Abs: 3 10*3/uL (ref 1.7–7.7)
Neutrophils Relative %: 80 %
Platelets: 20 10*3/uL — CL (ref 150–400)
RBC: 4.05 MIL/uL (ref 3.87–5.11)
RDW: 14.4 % (ref 11.5–15.5)
WBC: 3.8 10*3/uL — ABNORMAL LOW (ref 4.0–10.5)
nRBC: 0 % (ref 0.0–0.2)

## 2020-07-18 LAB — THERAPEUTIC PLASMA EXCHANGE (BLOOD BANK)
Plasma volume needed: 2400
Unit division: 0
Unit division: 0
Unit division: 0
Unit division: 0

## 2020-07-18 LAB — COMPREHENSIVE METABOLIC PANEL
ALT: 45 U/L — ABNORMAL HIGH (ref 0–44)
AST: 107 U/L — ABNORMAL HIGH (ref 15–41)
Albumin: 3.1 g/dL — ABNORMAL LOW (ref 3.5–5.0)
Alkaline Phosphatase: 62 U/L (ref 38–126)
Anion gap: 7 (ref 5–15)
BUN: 32 mg/dL — ABNORMAL HIGH (ref 8–23)
CO2: 29 mmol/L (ref 22–32)
Calcium: 9.2 mg/dL (ref 8.9–10.3)
Chloride: 102 mmol/L (ref 98–111)
Creatinine, Ser: 1.37 mg/dL — ABNORMAL HIGH (ref 0.44–1.00)
GFR, Estimated: 40 mL/min — ABNORMAL LOW (ref >60)
Glucose, Bld: 143 mg/dL — ABNORMAL HIGH (ref 70–99)
Potassium: 6 mmol/L — ABNORMAL HIGH (ref 3.5–5.1)
Sodium: 138 mmol/L (ref 135–145)
Total Bilirubin: 1.6 mg/dL — ABNORMAL HIGH (ref 0.3–1.2)
Total Protein: 5.3 g/dL — ABNORMAL LOW (ref 6.5–8.1)

## 2020-07-18 LAB — LACTATE DEHYDROGENASE: LDH: 672 U/L — ABNORMAL HIGH (ref 98–192)

## 2020-07-18 LAB — GRAM STAIN: Gram Stain: NONE SEEN

## 2020-07-18 MED ORDER — DIPHENHYDRAMINE HCL 25 MG PO CAPS
ORAL_CAPSULE | ORAL | Status: AC
  Administered 2020-07-18: 25 mg via ORAL
  Filled 2020-07-18: qty 1

## 2020-07-18 MED ORDER — LORAZEPAM 0.5 MG PO TABS
0.5000 mg | ORAL_TABLET | Freq: Four times a day (QID) | ORAL | Status: DC | PRN
  Administered 2020-07-18: 0.5 mg via ORAL
  Filled 2020-07-18: qty 1

## 2020-07-18 MED ORDER — CALCIUM CARBONATE ANTACID 500 MG PO CHEW
2.0000 | CHEWABLE_TABLET | ORAL | Status: DC
  Administered 2020-07-18: 400 mg via ORAL
  Filled 2020-07-18: qty 2

## 2020-07-18 MED ORDER — METHYLPREDNISOLONE SODIUM SUCC 125 MG IJ SOLR: 125.0000 mg | INTRAMUSCULAR | Status: AC

## 2020-07-18 MED ORDER — CALCIUM GLUCONATE-NACL 2-0.675 GM/100ML-% IV SOLN
INTRAVENOUS | Status: AC
  Administered 2020-07-18: 2000 mg via INTRAVENOUS
  Filled 2020-07-18: qty 100

## 2020-07-18 MED ORDER — ACD FORMULA A 0.73-2.45-2.2 GM/100ML VI SOLN: 1000.0000 mL | Status: DC

## 2020-07-18 MED ORDER — CALCIUM GLUCONATE-NACL 2-0.675 GM/100ML-% IV SOLN
INTRAVENOUS | Status: AC
  Administered 2020-07-18: 2000 mg
  Filled 2020-07-18: qty 100

## 2020-07-18 MED ORDER — EPINEPHRINE PF 1 MG/ML IJ SOLN: 0.1000 mg | INTRAMUSCULAR | Status: AC

## 2020-07-18 MED ORDER — CALCIUM GLUCONATE-NACL 2-0.675 GM/100ML-% IV SOLN: 2.0000 g | Freq: Once | INTRAVENOUS | Status: AC

## 2020-07-18 MED ORDER — DIPHENHYDRAMINE HCL 25 MG PO CAPS: 25.0000 mg | ORAL_CAPSULE | Freq: Four times a day (QID) | ORAL | Status: DC | PRN

## 2020-07-18 MED ORDER — EPINEPHRINE 1 MG/10ML IJ SOSY
PREFILLED_SYRINGE | INTRAMUSCULAR | Status: AC
  Administered 2020-07-18: 0.2 mg
  Filled 2020-07-18: qty 10

## 2020-07-18 MED ORDER — EPINEPHRINE 0.3 MG/0.3ML IJ SOAJ
0.3000 mg | INTRAMUSCULAR | Status: AC
  Filled 2020-07-18: qty 0.3

## 2020-07-18 MED ORDER — CALCIUM GLUCONATE-NACL 2-0.675 GM/100ML-% IV SOLN
2.0000 g | Freq: Once | INTRAVENOUS | Status: AC
  Administered 2020-07-18: 2000 mg via INTRAVENOUS
  Filled 2020-07-18: qty 100

## 2020-07-18 MED ORDER — CALCIUM GLUCONATE 10 % IV SOLN
2.0000 g | Freq: Once | INTRAVENOUS | Status: DC
  Filled 2020-07-18: qty 20

## 2020-07-18 MED ORDER — ACETAMINOPHEN 325 MG PO TABS
ORAL_TABLET | ORAL | Status: AC
  Administered 2020-07-18: 650 mg via ORAL
  Filled 2020-07-18: qty 2

## 2020-07-18 MED ORDER — ACD FORMULA A 0.73-2.45-2.2 GM/100ML VI SOLN
Status: AC
  Filled 2020-07-18: qty 500

## 2020-07-18 MED ORDER — ACETAMINOPHEN 325 MG PO TABS
650.0000 mg | ORAL_TABLET | ORAL | Status: DC | PRN
  Administered 2020-07-18: 650 mg via ORAL

## 2020-07-18 MED ORDER — METHYLPREDNISOLONE SODIUM SUCC 125 MG IJ SOLR
INTRAMUSCULAR | Status: AC
  Filled 2020-07-18: qty 2

## 2020-07-18 MED ORDER — ACETAMINOPHEN 325 MG PO TABS
ORAL_TABLET | ORAL | Status: AC
  Filled 2020-07-18: qty 2

## 2020-07-18 MED ORDER — ANTICOAGULANT SODIUM CITRATE 4% (200MG/5ML) IV SOLN
5.0000 mL | Status: DC
  Filled 2020-07-18 (×3): qty 5

## 2020-07-18 MED ORDER — ACETAMINOPHEN 325 MG PO TABS: 650.0000 mg | ORAL_TABLET | ORAL | Status: DC | PRN

## 2020-07-18 MED ORDER — CALCIUM GLUCONATE-NACL 2-0.675 GM/100ML-% IV SOLN
2.0000 g | Freq: Once | INTRAVENOUS | Status: AC
  Administered 2020-07-18: 2000 mg via INTRAVENOUS

## 2020-07-18 MED ORDER — DIPHENHYDRAMINE HCL 50 MG/ML IJ SOLN
INTRAMUSCULAR | Status: AC
  Filled 2020-07-18: qty 1

## 2020-07-18 MED ORDER — ACD FORMULA A 0.73-2.45-2.2 GM/100ML VI SOLN
1000.0000 mL | Status: DC
  Administered 2020-07-18: 1000 mL
  Filled 2020-07-18 (×2): qty 1000

## 2020-07-18 MED ORDER — ANTICOAGULANT SODIUM CITRATE 4% (200MG/5ML) IV SOLN
5.0000 mL | Freq: Once | Status: DC
  Administered 2020-07-18: 5 mL
  Filled 2020-07-18 (×2): qty 5

## 2020-07-18 MED ORDER — CALCIUM CARBONATE ANTACID 500 MG PO CHEW
CHEWABLE_TABLET | ORAL | Status: AC
  Administered 2020-07-18: 400 mg via ORAL
  Filled 2020-07-18: qty 2

## 2020-07-18 NOTE — Progress Notes (Signed)
PROGRESS NOTE    [name]  ZOX:[id number] DOB: October 15, 1940 DOA: 07/16/2020 PCP: Kirk Ruths, MD    No chief complaint on file.   Brief Narrative:   [name] is a 80 y.o. 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  Her daughter reports that she has had 5 prior bouts of TTP.  She lost taste/smell last week - this has happened with prior episodes but they also repeatedly tested for COVID.  She felt better and then in the last few days developed fine petechiae on her abdomen; headache; ataxia (listing to the left); and diffuse large joint pains.  She did have some mild LE bruising.  They suspected TTP recurrence and so went to see hematology today.  On prior episodes, she usually needs hospitalization for 72 hours or so and then outpatient phoresis.  She smokes 2-3 ppd and drinks 1 beer per night.    ED Course:  Direct admission from Dr. Benay Spice -  Concern for recurrent TTP.  Needs emergent plasmapheresis.  Dr. Benay Spice to coordinate with nephrology.  Subjective:   plasmapheresis stopped due tthis morning due to suspected transfusion reaction,  She improved after Benadryl ,solumedrol, epi, calcium gluconate  She currently denies headache, no confusion, no fever, no sign of bleeding Daughter at bedside  Assessment & Plan:   Principal Problem:   TTP (thrombotic thrombocytopenic purpura) Active Problems:   COPD (chronic obstructive pulmonary disease) (HCC)   Tobacco abuse   Chronic pain disorder   Mood disorder (HCC)  Recurrent TTP -First diagnosed in 2005, received plasma exchange, steroids and rituximab in the past -She received FFP x2, Solu-Medrol, plasmapheresis day 1 started on 3/18 -Platelet number improving, however other parameters are concerning -Management per hematology  -fall precaution, bedrest until  platelet number improves  Possible transfusion reaction Symptom appear resolved quickly after Benadryl, steroid, epi, calcium gluconate -Blood culture obtained -Sample sent to blood bank for transfusion reaction investigation -She is to transfer to progressive unit for close monitoring -Nephrology and critical care input appreciated  COPD, current smoker Stable, no wheezing, no hypoxia Continue home medication Encourage smoking cessation  History of epidural abscess, Enterococcus sepsis, endocarditis in 2012 after laminectomy No sign of infection currently  Chronic back pain Appears at baseline, continue Ultram   Mood disorder Continue Prozac and Seroquel     Unresulted Labs (From admission, onward)          Start     Ordered   07/18/20 1117  Culture, blood (routine x 2)  BLOOD CULTURE X 2,   R (with TIMED occurrences)      07/18/20 1115   07/18/20 1115  Transfusion reaction  Once,   STAT        07/18/20 1115   07/17/20 0500  CBC with Differential  Daily,   R      07/16/20 1627   07/17/20 0500  Comprehensive metabolic panel  Daily,   R      07/16/20 1627   07/17/20 0500  Lactate dehydrogenase  Daily,   R      07/16/20 1627   07/16/20 2009  Urinalysis, Complete w Microscopic Urine, Clean Catch  Once,   R        07/16/20 2008            DVT prophylaxis: Place and maintain sequential compression device Start: 07/17/20 1348 SCDs Start:  07/16/20 1808   Code Status: Full Family Communication: Daughter at bedside Disposition:   Status is: Inpatient  Dispo: The patient is from: home               Anticipated d/c is to: home              Anticipated d/c date is: TBD,                 Consultants:   Hematology   Nephrology  Critical care  Procedures:   Plasmapheresis  Antimicrobials:   . Anti-infectives (From admission, onward)   None          Objective: Vitals:   07/18/20 0929 07/18/20 0938 07/18/20 1055 07/18/20 1110  BP:  (!) 122/58  (!) 136/50 (!) 126/52  Pulse: 69 72 86 85  Resp: 20 (!) 25 (!) 24 (!) 27  Temp: 98.3 F (36.8 C)     TempSrc: Oral     SpO2: (!) 88% (!) 89% 99% 93%    Intake/Output Summary (Last 24 hours) at 07/18/2020 1149 Last data filed at 07/18/2020 0700 Gross per 24 hour  Intake 1546.73 ml  Output 700 ml  Net 846.73 ml   There were no vitals filed for this visit.  Examination:  General exam: calm, NAD Respiratory system: Clear to auscultation. Respiratory effort normal. Cardiovascular system: S1 & S2 heard, RRR.  +precordial murmur ( reports chronic), No pedal edema. Gastrointestinal system: Abdomen is nondistended, soft and nontender. . Normal bowel sounds heard. Central nervous system: Alert and oriented. No focal neurological deficits. Extremities: Symmetric 5 x 5 power. Skin: No rashes, lesions or ulcers Psychiatry: Judgement and insight appear normal. Mood & affect appropriate.     Data Reviewed: I have personally reviewed following labs and imaging studies  CBC: Recent Labs  Lab 07/13/20 1423 07/16/20 1027 07/17/20 0100 07/18/20 0007  WBC 6.0 3.4* 2.7* 3.8*  NEUTROABS 4.6 2.8 2.3 3.0  HGB 14.7 14.7 13.8 13.1  HCT 44.7 44.6 41.0 38.3  MCV 98.2 95.9 96.0 94.6  PLT 81* 17* 9* 20*    Basic Metabolic Panel: Recent Labs  Lab 07/13/20 1423 07/16/20 1027 07/17/20 0100 07/18/20 0007  NA 139 137 134* 138  K 4.8 4.5 4.9 6.0*  CL 106 102 99 102  CO2 28 27 28 29   GLUCOSE 99 195* 204* 143*  BUN 27* 26* 27* 32*  CREATININE 1.42* 1.52* 1.54* 1.37*  CALCIUM 9.3 9.0 8.9 9.2    GFR: Estimated Creatinine Clearance: 33.1 mL/min (A) (by C-G formula based on SCr of 1.37 mg/dL (H)).  Liver Function Tests: Recent Labs  Lab 07/13/20 1423 07/16/20 1027 07/17/20 0100 07/18/20 0007  AST 18 48* 65* 107*  ALT 19 32 42 45*  ALKPHOS 64 51 48 62  BILITOT 0.5 0.9 1.4* 1.6*  PROT 7.0 6.8 6.2* 5.3*  ALBUMIN 3.7 3.6 3.4* 3.1*    CBG: No results for input(s): GLUCAP in the last  168 hours.   Recent Results (from the past 240 hour(s))  SARS CORONAVIRUS 2 (TAT 6-24 HRS) Nasopharyngeal Nasopharyngeal Swab     Status: None   Collection Time: 07/16/20  7:26 PM   Specimen: Nasopharyngeal Swab  Result Value Ref Range Status   SARS Coronavirus 2 NEGATIVE NEGATIVE Final    Comment: (NOTE) SARS-CoV-2 target nucleic acids are NOT DETECTED.  The SARS-CoV-2 RNA is generally detectable in upper and lower respiratory specimens during the acute phase of infection. Negative results do not preclude SARS-CoV-2  infection, do not rule out co-infections with other pathogens, and should not be used as the sole basis for treatment or other patient management decisions. Negative results must be combined with clinical observations, patient history, and epidemiological information. The expected result is Negative.  Fact Sheet for Patients: SugarRoll.be  Fact Sheet for Healthcare Providers: https://www.woods-mathews.com/  This test is not yet approved or cleared by the [place] FDA and  has been authorized for detection and/or diagnosis of SARS-CoV-2 by FDA under an Emergency Use Authorization (EUA). This EUA will remain  in effect (meaning this test can be used) for the duration of the COVID-19 declaration under Se ction 564(b)(1) of the Act, 21 U.S.C. section 360bbb-3(b)(1), unless the authorization is terminated or revoked sooner.  Performed at [facility], [place] [address], [place], [place] [address]          Radiology Studies: IR US Guide Vasc Access Left  Result Date: 07/17/2020 INDICATION: 80 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 EXAM: FLUOROSCOPIC AND ULTRASOUND GUIDED PLACEMENT OF A NON-TUNNELED DIALYSIS CATHETER Physician: Stephan Minister. Henn, MD MEDICATIONS: None ANESTHESIA/SEDATION: None FLUOROSCOPY TIME:  Fluoroscopy Time: 2 minutes, 36 seconds, 6 mGy COMPLICATIONS: None immediate.  PROCEDURE: The procedure was explained to the patient. The risks and benefits of the procedure were discussed and the patient's questions were addressed. Informed consent was obtained from the patient. The patient was placed supine on the interventional table. Ultrasound confirmed a patent right internal jugular vein. The right neck was prepped and draped in a sterile fashion. The right neck was anesthetized with 1% lidocaine. Maximal barrier sterile technique was utilized including caps, mask, sterile gowns, sterile gloves, sterile drape, hand hygiene and skin antiseptic. A small incision was made with #11 blade scalpel. A 21 gauge needle directed into the right internal vein with ultrasound guidance. A micropuncture dilator set was placed but the wire would not advance centrally. Although the right internal jugular vein was patent, it was small. Eventually, this access site was abandoned. Ultrasound confirmed a patent left internal jugular vein. Ultrasound image was saved for documentation. Left side of the neck was prepped and draped in sterile fashion. Maximal barrier sterile technique was utilized including caps, mask, sterile gowns, sterile gloves, sterile drape, hand hygiene and skin antiseptic. Skin was anesthetized with 1% lidocaine. A small incision was made. Using ultrasound guidance, 21 gauge needle was directed into the left internal jugular vein. Wire was advanced centrally. Micropuncture dilator set was placed. A J wire was advanced into the SVC. Tract was dilated. A 24 cm Mahurkar catheter was selected. The catheter was advanced over a wire and positioned at the superior cavoatrial junction. Fluoroscopic images were obtained for documentation. Both dialysis lumens were found to aspirate and flush well. The proper amount of heparin was flushed in both lumens. The central venous lumen was flushed with normal saline. Catheter was sutured to skin. FINDINGS: Catheter tip at the superior cavoatrial  junction. IMPRESSION: Successful placement of a left jugular non-tunneled dialysis catheter using ultrasound and fluoroscopic guidance. Electronically Signed   By: Markus Daft M.D.   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 EXAM: FLUOROSCOPIC AND ULTRASOUND GUIDED PLACEMENT OF A NON-TUNNELED DIALYSIS CATHETER Physician: Stephan Minister. Henn, MD MEDICATIONS: None ANESTHESIA/SEDATION: None FLUOROSCOPY TIME:  Fluoroscopy Time: 2 minutes, 36 seconds, 6 mGy COMPLICATIONS: None immediate. PROCEDURE: The procedure was  explained to the patient. The risks and benefits of the procedure were discussed and the patient's questions were addressed. Informed consent was obtained from the patient. The patient was placed supine on the interventional table. Ultrasound confirmed a patent right internal jugular vein. The right neck was prepped and draped in a sterile fashion. The right neck was anesthetized with 1% lidocaine. Maximal barrier sterile technique was utilized including caps, mask, sterile gowns, sterile gloves, sterile drape, hand hygiene and skin antiseptic. A small incision was made with #11 blade scalpel. A 21 gauge needle directed into the right internal vein with ultrasound guidance. A micropuncture dilator set was placed but the wire would not advance centrally. Although the right internal jugular vein was patent, it was small. Eventually, this access site was abandoned. Ultrasound confirmed a patent left internal jugular vein. Ultrasound image was saved for documentation. Left side of the neck was prepped and draped in sterile fashion. Maximal barrier sterile technique was utilized including caps, mask, sterile gowns, sterile gloves, sterile drape, hand hygiene and skin antiseptic. Skin was anesthetized with 1% lidocaine. A small incision was made. Using ultrasound guidance, 21 gauge  needle was directed into the left internal jugular vein. Wire was advanced centrally. Micropuncture dilator set was placed. A J wire was advanced into the SVC. Tract was dilated. A 24 cm Mahurkar catheter was selected. The catheter was advanced over a wire and positioned at the superior cavoatrial junction. Fluoroscopic images were obtained for documentation. Both dialysis lumens were found to aspirate and flush well. The proper amount of heparin was flushed in both lumens. The central venous lumen was flushed with normal saline. Catheter was sutured to skin. FINDINGS: Catheter tip at the superior cavoatrial junction. IMPRESSION: Successful placement of a left jugular non-tunneled dialysis catheter using ultrasound and fluoroscopic guidance. Electronically Signed   By: Markus Daft M.D.   On: 07/17/2020 08:02        Scheduled Meds: . sodium chloride   Intravenous Once  . acetaminophen      . calcium carbonate  2 tablet Oral Q3H  . Chlorhexidine Gluconate Cloth  6 each Topical Q0600  . diphenhydrAMINE      . diphenhydrAMINE      . docusate sodium  100 mg Oral BID  . EPINEPHrine  0.1 mg Intravenous STAT  . EPINEPHrine  0.3 mg Intramuscular STAT  . feeding supplement  237 mL Oral BID BM  . FLUoxetine  20 mg Oral Daily  . gabapentin  300 mg Oral BID  . methylPREDNISolone (SOLU-MEDROL) injection  125 mg Intravenous STAT  . methylPREDNISolone (SOLU-MEDROL) injection  80 mg Intravenous Daily  . multivitamin with minerals  1 tablet Oral Daily  . nicotine  21 mg Transdermal Daily  . QUEtiapine  25 mg Oral QHS  . rosuvastatin  20 mg Oral QHS  . sodium chloride flush  3 mL Intravenous Q12H  . umeclidinium bromide  1 puff Inhalation Daily   Continuous Infusions: . anticoagulant sodium citrate    . calcium gluconate    . calcium gluconate    . citrate dextrose    . citrate dextrose    . lactated ringers 75 mL/hr at 07/18/20 0509     LOS: 2 days   Time spent: 32mins, case discussed with  nephrology, critical care, hematology Greater than 50% of this time was spent in counseling, explanation of diagnosis, planning of further management, and coordination of care.   Voice Recognition Viviann Spare dictation system was used to create this  note, attempts have been made to correct errors. Please contact the author with questions and/or clarifications.   Florencia Reasons, MD PhD FACP [facility]  Available via Epic secure chat 7am-7pm for nonurgent issues Please page for urgent issues To page the attending provider between 7A-7P or the covering provider during after hours 7P-7A, please log into the web site [email or web address] and access using universal [place] password for that web site. If you do not have the password, please call the hospital operator.    07/18/2020, 11:49 AM

## 2020-07-18 NOTE — Progress Notes (Signed)
[name]   DOB:1940/08/03   JI#:[id number]   FBP#:[id number]  Subjective:  I was called by the pheresis nurse this AM to tell me the patient had a reaction to pheresis, about half-way through the cycle. The blood products were sent downstairs and pheresis stopped. The patient recovered quickly. When I came to see the patient in her room however she refused to see me--or anyone. Spoke to patient's daughter and nurse.   Objective:  Vitals:   07/18/20 1110 07/18/20 1237  BP: (!) 126/52 (!) 117/49  Pulse: 85 78  Resp: (!) 27 (!) 22  Temp:  100.1 F (37.8 C)  SpO2: 93% 92%    There is no height or weight on file to calculate BMI.  Intake/Output Summary (Last 24 hours) at 07/18/2020 1304 Last data filed at 07/18/2020 0700 Gross per 24 hour  Intake 1546.73 ml  Output 700 ml  Net 846.73 ml      CBG (last 3)  No results for input(s): GLUCAP in the last 72 hours.   Labs:  Lab Results  Component Value Date   WBC 3.8 (L) 07/18/2020   HGB 13.1 07/18/2020   HCT 38.3 07/18/2020   MCV 94.6 07/18/2020   PLT 20 (LL) 07/18/2020   NEUTROABS 3.0 07/18/2020    @LASTCHEMISTRY @  Urine Studies No results for input(s): UHGB, CRYS in the last 72 hours.  Invalid input(s): UACOL, UAPR, USPG, UPH, UTP, UGL, UKET, UBIL, UNIT, UROB, ULEU, UEPI, UWBC, [place], [place], [place], [place], [place]  Basic Metabolic Panel: Recent Labs  Lab 07/13/20 1423 07/16/20 1027 07/17/20 0100 07/18/20 0007  NA 139 137 134* 138  K 4.8 4.5 4.9 6.0*  CL 106 102 99 102  CO2 28 27 28 29   GLUCOSE 99 195* 204* 143*  BUN 27* 26* 27* 32*  CREATININE 1.42* 1.52* 1.54* 1.37*  CALCIUM 9.3 9.0 8.9 9.2   GFR Estimated Creatinine Clearance: 33.1 mL/min (A) (by C-G formula based on SCr of 1.37 mg/dL (H)). Liver Function Tests: Recent Labs  Lab 07/13/20 1423 07/16/20 1027 07/17/20 0100 07/18/20 0007  AST 18 48* 65* 107*  ALT 19 32 42 45*  ALKPHOS 64 51 48 62  BILITOT 0.5 0.9 1.4* 1.6*  PROT 7.0 6.8 6.2* 5.3*   ALBUMIN 3.7 3.6 3.4* 3.1*   No results for input(s): LIPASE, AMYLASE in the last 168 hours. No results for input(s): AMMONIA in the last 168 hours. Coagulation profile Recent Labs  Lab 07/16/20 1833  INR 1.1    CBC: Recent Labs  Lab 07/13/20 1423 07/16/20 1027 07/17/20 0100 07/18/20 0007  WBC 6.0 3.4* 2.7* 3.8*  NEUTROABS 4.6 2.8 2.3 3.0  HGB 14.7 14.7 13.8 13.1  HCT 44.7 44.6 41.0 38.3  MCV 98.2 95.9 96.0 94.6  PLT 81* 17* 9* 20*   Cardiac Enzymes: No results for input(s): CKTOTAL, CKMB, CKMBINDEX, TROPONINI in the last 168 hours. BNP: Invalid input(s): POCBNP CBG: No results for input(s): GLUCAP in the last 168 hours. D-Dimer No results for input(s): DDIMER in the last 72 hours. Hgb A1c No results for input(s): HGBA1C in the last 72 hours. Lipid Profile No results for input(s): CHOL, HDL, LDLCALC, TRIG, CHOLHDL, LDLDIRECT in the last 72 hours. Thyroid function studies No results for input(s): TSH, T4TOTAL, T3FREE, THYROIDAB in the last 72 hours.  Invalid input(s): FREET3 Anemia work up Recent Labs    07/16/20 1028  RETICCTPCT 1.5   Microbiology Recent Results (from the past 240 hour(s))  SARS CORONAVIRUS 2 (TAT 6-24  HRS) Nasopharyngeal Nasopharyngeal Swab     Status: None   Collection Time: 07/16/20  7:26 PM   Specimen: Nasopharyngeal Swab  Result Value Ref Range Status   SARS Coronavirus 2 NEGATIVE NEGATIVE Final    Comment: (NOTE) SARS-CoV-2 target nucleic acids are NOT DETECTED.  The SARS-CoV-2 RNA is generally detectable in upper and lower respiratory specimens during the acute phase of infection. Negative results do not preclude SARS-CoV-2 infection, do not rule out co-infections with other pathogens, and should not be used as the sole basis for treatment or other patient management decisions. Negative results must be combined with clinical observations, patient history, and epidemiological information. The expected result is Negative.  Fact  Sheet for Patients: SugarRoll.be  Fact Sheet for Healthcare Providers: https://www.woods-mathews.com/  This test is not yet approved or cleared by the [place] FDA and  has been authorized for detection and/or diagnosis of SARS-CoV-2 by FDA under an Emergency Use Authorization (EUA). This EUA will remain  in effect (meaning this test can be used) for the duration of the COVID-19 declaration under Se ction 564(b)(1) of the Act, 21 U.S.C. section 360bbb-3(b)(1), unless the authorization is terminated or revoked sooner.  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 EXAM: FLUOROSCOPIC AND ULTRASOUND GUIDED PLACEMENT OF A NON-TUNNELED DIALYSIS CATHETER Physician: Stephan Minister. Henn, MD MEDICATIONS: None ANESTHESIA/SEDATION: None FLUOROSCOPY TIME:  Fluoroscopy Time: 2 minutes, 36 seconds, 6 mGy COMPLICATIONS: None immediate. PROCEDURE: The procedure was explained to the patient. The risks and benefits of the procedure were discussed and the patient's questions were addressed. Informed consent was obtained from the patient. The patient was placed supine on the interventional table. Ultrasound confirmed a patent right internal jugular vein. The right neck was prepped and draped in a sterile fashion. The right neck was anesthetized with 1% lidocaine. Maximal barrier sterile technique was utilized including caps, mask, sterile gowns, sterile gloves, sterile drape, hand hygiene and skin antiseptic. A small incision was made with #11 blade scalpel. A 21 gauge needle directed into the right internal vein with ultrasound guidance. A micropuncture dilator set was placed but the wire would not advance centrally. Although the right internal jugular vein was patent, it was small. Eventually,  this access site was abandoned. Ultrasound confirmed a patent left internal jugular vein. Ultrasound image was saved for documentation. Left side of the neck was prepped and draped in sterile fashion. Maximal barrier sterile technique was utilized including caps, mask, sterile gowns, sterile gloves, sterile drape, hand hygiene and skin antiseptic. Skin was anesthetized with 1% lidocaine. A small incision was made. Using ultrasound guidance, 21 gauge needle was directed into the left internal jugular vein. Wire was advanced centrally. Micropuncture dilator set was placed. A J wire was advanced into the SVC. Tract was dilated. A 24 cm Mahurkar catheter was selected. The catheter was advanced over a wire and positioned at the superior cavoatrial junction. Fluoroscopic images were obtained for documentation. Both dialysis lumens were found to aspirate and flush well. The proper amount of heparin was flushed in both lumens. The central venous lumen was flushed with normal saline. Catheter was sutured to skin. FINDINGS: Catheter tip at the superior cavoatrial junction. IMPRESSION: Successful placement of a left jugular non-tunneled dialysis catheter using ultrasound and fluoroscopic guidance. Electronically Signed   By:  Markus Daft M.D.   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 EXAM: FLUOROSCOPIC AND ULTRASOUND GUIDED PLACEMENT OF A NON-TUNNELED DIALYSIS CATHETER Physician: Stephan Minister. Henn, MD MEDICATIONS: None ANESTHESIA/SEDATION: None FLUOROSCOPY TIME:  Fluoroscopy Time: 2 minutes, 36 seconds, 6 mGy COMPLICATIONS: None immediate. PROCEDURE: The procedure was explained to the patient. The risks and benefits of the procedure were discussed and the patient's questions were addressed. Informed consent was obtained from the patient. The patient was placed supine on the interventional table.  Ultrasound confirmed a patent right internal jugular vein. The right neck was prepped and draped in a sterile fashion. The right neck was anesthetized with 1% lidocaine. Maximal barrier sterile technique was utilized including caps, mask, sterile gowns, sterile gloves, sterile drape, hand hygiene and skin antiseptic. A small incision was made with #11 blade scalpel. A 21 gauge needle directed into the right internal vein with ultrasound guidance. A micropuncture dilator set was placed but the wire would not advance centrally. Although the right internal jugular vein was patent, it was small. Eventually, this access site was abandoned. Ultrasound confirmed a patent left internal jugular vein. Ultrasound image was saved for documentation. Left side of the neck was prepped and draped in sterile fashion. Maximal barrier sterile technique was utilized including caps, mask, sterile gowns, sterile gloves, sterile drape, hand hygiene and skin antiseptic. Skin was anesthetized with 1% lidocaine. A small incision was made. Using ultrasound guidance, 21 gauge needle was directed into the left internal jugular vein. Wire was advanced centrally. Micropuncture dilator set was placed. A J wire was advanced into the SVC. Tract was dilated. A 24 cm Mahurkar catheter was selected. The catheter was advanced over a wire and positioned at the superior cavoatrial junction. Fluoroscopic images were obtained for documentation. Both dialysis lumens were found to aspirate and flush well. The proper amount of heparin was flushed in both lumens. The central venous lumen was flushed with normal saline. Catheter was sutured to skin. FINDINGS: Catheter tip at the superior cavoatrial junction. IMPRESSION: Successful placement of a left jugular non-tunneled dialysis catheter using ultrasound and fluoroscopic guidance. Electronically Signed   By: Markus Daft M.D.   On: 07/17/2020 08:02    Assessment: 80 y.o. [place], Nice woman  1. TTP diagnosed  in 2005, treated with plasma exchange, steroids, and rituximab consolidation ? 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 Relapse of TTP March 2015-plasma exchange, steroids, rituximab, and maintenance azathioprine ? Admission with severe thrombocytopenia and elevated LDH 07/16/2020, consistent with relapse of TTP       -Steroids/FFP given 07/16/2020       -Daily plasma exchange beginning 07/17/2020 2. History of epidural abscess requiring laminectomy 2012 3. 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 COPD    Plan:  [name] had a reaction during pheresis this AM. According to the daughter the patient felt heat and chills and a sudden headache. She never lost consciousness, did not have stridorous breathing, and recovered quickly per the pheresis nurse. Patient is agreeable to a second try this afternoon and I have discussed with pheresis, who are also agreeable.  Platelets slightly better at 20K, other parameters of concern.  Will add lorazepam given patient's stress at this time.   Chauncey Cruel, MD 07/18/2020  1:04 PM Medical Oncology and  Hematology [facility] [place], [place] [address] Tel. [phone]    Fax. [phone]

## 2020-07-18 NOTE — Progress Notes (Signed)
I was present in the dialysis unit when pt started having the acute onset of rigors/ shaking chills during TPE.   On my assessment, pt was intensely uncomfortable with uncontrollable rigors consistent with impending anaphylactic reaction vs transfusion reaction vs endotoxin reaction.  RRT activated.    TPE was emergently stopped and blood was not returned.    Pt was tachycardic, normotensive, O2 sat decreased and placed on 2L o2.  No signs of angioedema.  Axillary temp 99.6 degrees F.  She was placed on O2 and given 50 mg IV benadryl, 125 mg IV solumedrol, 2 g calcium gluconate, and 0.1 IV epi x 2.  Rigors resolved with the above measures.    The entire TPE setup (FFP bags and waste bags) has been preserved to take down to the blood bank to assess for transfusion reaction.    Blood cultures ordered as well.    Dr Erlinda Hong informed.  Remainder of pt's care (including decision to resume TPE with a new setup today) deferred to heme/ onc (Dr Jana Hakim today).    Please call with questions.    Madelon Lips MD [facility] pgr [phone]

## 2020-07-18 NOTE — Progress Notes (Signed)
Pt alert and oriented x4. Off unit to dialysis.

## 2020-07-18 NOTE — Progress Notes (Signed)
Called and gave report to [place] on [facility]. (Rm 23)

## 2020-07-18 NOTE — Significant Event (Signed)
Rapid Response Event Note   Reason for Call :  Possible reaction during pheresis  Initial Focused Assessment:  Responding to a call from HD about a patient who has a history of TTP and is undergoing TPE.  The patient suddenly became suddenly uncomfortable and had chills.  Nephrology was bedside when this transpired.  The TPE was stopped, tagged, and sent down for further review.  The patient received 50 mg IV Benadryl, 125 mg solumedrol, 2 g of Ca Gluconate, and 0.2 epi.    The patient does not complain of chest pain or shortness of breath.  No stridor auscultated.  No angioedema noted on examination.  Patient denies any tingling in her mouth or airway and she does not feel that her lips or tongue are swelling.  This patient has had a reaction similar to this when she was receiving TPE.  She was given steroids and the reaction passed.    BP 126/52 ECG 85 RR 27 O2 93% 5 L O2 Temp 99.6 axillary   Interventions:  50 IV benadryl 125 mg Solumedrol 2 g Ca Gluconate 0.2 Epi  Vitals taken  Plan of Care:  Patient has orders for progressive care bed but we are awaiting a unit to notify when there is availability.  Upon discussion with PCCM and Nephrology, the patient was returned to 6N on telemetry monitoring with q1 hr vitals.     Event Summary:   MD Notified: Neprology and PCCM bedside Call Time: [place] Time: [place] End Time: [place], RN

## 2020-07-18 NOTE — Progress Notes (Signed)
Therapeutic Plasma exchange complete. No signs or symptoms of reaction observed. Vitals stable. No pain Tolerated treatment well

## 2020-07-18 NOTE — Consult Note (Signed)
NAME:  [name], MRN:  [id number], DOB:  1940-11-08, LOS: 2 ADMISSION DATE:  07/16/2020, CONSULTATION DATE:  07/18/2020 REFERRING MD:  Dr. Erlinda Hong, CHIEF COMPLAINT:  ? 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  Symptoms, except diffuse joint pain, similar to prior presentations of TTP.  She was seen by oncology with recommendations for admission and plasmapheresis and solumedrol.  Noted to have thrombocytopenia on admit, elevated LDH, AKI, with pending ADAMTS13 pending, normal Hgb and t.bili. A left IJ temp HD cath was placed by IR on 3/17 and she was started on plasmapheresis 3/18.  Patient reports symptomatic improvement since starting.    PCCM called to bedside on 3/19 in hemodialysis unit for concern of anaphylaxis.  Patient was starting on 8th of 12 bags of TPE when she developed acute rigors and shaking.  Nephrology was in department, concerned for impending anaphylaxis reaction vs possible transfusion vs endotoxin reaction and initiated rapid response. TPE was stopped, blood not returned, and entire TPE sent to blood bank to assess for transfusion reaction.  Axillary temp 99.6, no symptoms of angioedema, she was tachycardic but normotensive, and placed on 2L .  She was treated with benadryl, solumedrol, calcium gluconate, and epi 0.1 mg IV x 2 with complete resolution of symptoms. Blood cultures sent additionally.  Patient currently denies any SOB, dysphagia, rash, itching, nausea, or chest pain.  Has a congested  cough which is near her baseline.  [name] tells me that she previously had a reaction during TPE once.  Stated her face felt numb, it was stopped and she was given benadryl and sent to the ER.   PCCM asked to evaluate if ICU is warranted.   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 Relapse of TTP March 2015-plasma exchange, steroids, rituximab, and maintenance azathioprine  Significant Hospital Events: Including procedures, antibiotic start and stop dates in addition to other pertinent events   . 3/17 admitted [place] with suspected relapse of TTP . 3/18 TPE started . 3/19 reaction during 2nd TPE, PCCM consulted   Interim History / Subjective:   Objective   Blood pressure (!) 122/58, pulse 72, temperature 98.3 F (36.8 C), temperature source Oral, resp. rate (!) 25, SpO2 (!) 89 %.        Intake/Output Summary (Last 24 hours) at 07/18/2020 1046 Last data filed at 07/18/2020 0700 Gross per 24 hour  Intake 1546.73 ml  Output 700 ml  Net 846.73 ml   There were no vitals filed for this visit.  Examination: General:  Chronically ill appearing elderly female lying in bed in NAD HEENT: MM pink/moist, no stridor Neuro: Awake, oriented x 3, MAE CV: NSR 80's PULM:  Non labored, diminished throughout, faint exp wheeze in uppers, congested dry  cough GI: obese, soft, +bs Extremities: warm/dry, no LE edema  Skin: some petechiae to abd area, no urticaria, no joint pain/ swelling/ warmth or erythema  Labs/imaging personally reviewed    Resolved Hospital Problem list    Assessment & Plan:   Suspected allergic vs transfusion reaction vs endotoxin reaction with day 2 of TPE for presumed TTP  relapse  - resolution of symptoms after stopping TPE, benadryl, solumedrol, calcium gluconate, and epi 0.1 mg IV x 2  - remains hemodynamically stable, no signs of impending airway compromise, angioedema or stridor -  Does not need ICU at this time, but needs PCU rather than returning to medical bed, continue telemetry and SpO2 monitoring - TPE set up sent for assessment of transfusion reaction - follow BCx - further treatment deferred to primary team and oncology   Remainder per primary team.  Nothing further to add.  PCCM will sign off.  Please do not hesitate to call us back if we can be of any further assistance.   Labs   CBC: Recent Labs  Lab 07/13/20 1423 07/16/20 1027 07/17/20 0100 07/18/20 0007  WBC 6.0 3.4* 2.7* 3.8*  NEUTROABS 4.6 2.8 2.3 3.0  HGB 14.7 14.7 13.8 13.1  HCT 44.7 44.6 41.0 38.3  MCV 98.2 95.9 96.0 94.6  PLT 81* 17* 9* 20*    Basic Metabolic Panel: Recent Labs  Lab 07/13/20 1423 07/16/20 1027 07/17/20 0100 07/18/20 0007  NA 139 137 134* 138  K 4.8 4.5 4.9 6.0*  CL 106 102 99 102  CO2 28 27 28 29   GLUCOSE 99 195* 204* 143*  BUN 27* 26* 27* 32*  CREATININE 1.42* 1.52* 1.54* 1.37*  CALCIUM 9.3 9.0 8.9 9.2   GFR: Estimated Creatinine Clearance: 33.1 mL/min (A) (by C-G formula based on SCr of 1.37 mg/dL (H)). Recent Labs  Lab 07/13/20 1423 07/16/20 1027 07/17/20 0100 07/18/20 0007  WBC 6.0 3.4* 2.7* 3.8*    Liver Function Tests: Recent Labs  Lab 07/13/20 1423 07/16/20 1027 07/17/20 0100 07/18/20 0007  AST 18 48* 65* 107*  ALT 19 32 42 45*  ALKPHOS 64 51 48 62  BILITOT 0.5 0.9 1.4* 1.6*  PROT 7.0 6.8 6.2* 5.3*  ALBUMIN 3.7 3.6 3.4* 3.1*   No results for input(s): LIPASE, AMYLASE in the last 168 hours. No results for input(s): AMMONIA in the last 168 hours.  ABG    Component Value Date/Time   PHART 7.343 (L) 08/19/2013 1546   PCO2ART 41.8 08/19/2013 1546   PO2ART 60.0 (L) 08/19/2013 1546   HCO3 22.4 08/19/2013 1546   TCO2  22 08/29/2013 1423   ACIDBASEDEF 3.0 (H) 08/19/2013 1546   O2SAT 87.0 08/19/2013 1546     Coagulation Profile: Recent Labs  Lab 07/16/20 1833  INR 1.1    Cardiac Enzymes: No results for input(s): CKTOTAL, CKMB, CKMBINDEX, TROPONINI in the last 168 hours.  HbA1C: Hemoglobin A1C  Date/Time Value Ref Range Status  09/14/2012 05:44 AM 5.4 4.2 - 6.3 % Final    Comment:    The American Diabetes Association recommends that a primary goal of therapy should be <7% and that physicians should reevaluate the treatment regimen in patients with HbA1c values consistently >8%.    Hgb A1c MFr Bld  Date/Time Value Ref Range Status  08/20/2013 01:35 PM 5.8 (H) <5.7 % Final    Comment:    (NOTE)  According to the ADA Clinical Practice Recommendations for 2011, when HbA1c is used as a screening test:  >=6.5%   Diagnostic of Diabetes Mellitus           (if abnormal result is confirmed) 5.7-6.4%   Increased risk of developing Diabetes Mellitus References:Diagnosis and Classification of Diabetes Mellitus,Diabetes LOVF,6433,29(JJOAC 1):S62-S69 and Standards of Medical Care in         Diabetes - 2011,Diabetes ZYSA,6301,60 (Suppl 1):S11-S61.    CBG: No results for input(s): GLUCAP in the last 168 hours.  Review of Systems:   As per HPI  Past Medical History:  She,  has a past medical history of Abnormal laboratory test (09/10/2013), Acute delirium, Anemia, Arthritis, Bacteremia (03/2011), Blood dyscrasia, Blood transfusion, Bronchitis, chronic obstructive w acute bronchitis ([place]) (08/12/2013), Chronic back pain, COPD (chronic obstructive pulmonary disease) ([place]), Depression, History of plasmapheresis (08/08/2013), History of TTP (thrombotic thrombocytopenic purpura), Hypokalemia (08/08/2013), Normal echocardiogram (03/15/11), Oral thrush (08/08/2013), Septicemia (03/2011), Spinal stenosis, lumbar, and Tobacco abuse (05/10/2013).    Surgical History:   Past Surgical History:  Procedure Laterality Date  . CATARACT EXTRACTION W/ INTRAOCULAR LENS  IMPLANT, BILATERAL  ~ 2010  . CESAREAN SECTION  10/09/1976  . EYE SURGERY    . INSERTION OF DIALYSIS CATHETER Left 07/12/2013   Procedure: INSERTION OF DIALYSIS CATHETER   with Ultrasound;  Surgeon: Rosetta Posner, MD;  Location[facility] OR;  Service: Vascular;  Laterality: Left;  . IR PERC TUN PERIT CATH WO PORT S&I Dartha Lodge  07/16/2020  . IR US GUIDE VASC ACCESS LEFT  07/16/2020  . LUMBAR LAMINECTOMY/DECOMPRESSION MICRODISCECTOMY  03/16/2011   Procedure: LUMBAR LAMINECTOMY/DECOMPRESSION MICRODISCECTOMY;  Surgeon: Elaina Hoops;  Location: [place] NEURO ORS;  Service: Neurosurgery;  Laterality: N/A;  . TEE WITHOUT CARDIOVERSION  03/21/2011   Procedure: TRANSESOPHAGEAL ECHOCARDIOGRAM (TEE);  Surgeon: Laverda Page;  Location: [facility] ENDOSCOPY;  Service: Cardiovascular;  Laterality: N/A;  TEE for vegetations  . TONSILLECTOMY    . TUBAL LIGATION       Social History:   reports that she has quit smoking. Her smoking use included e-cigarettes. She quit after 55.00 years of use. She has never used smokeless tobacco. She reports current alcohol use of about 7.0 standard drinks of alcohol per week. She reports that she does not use drugs.   Family History:  Her family history is not on file.   Allergies Allergies  Allergen Reactions  . Adhesive [Tape] Other (See Comments)    Blisters, Band-Aids brand rips off the skin- paper tape only!!  . Cefuroxime Axetil Anxiety and Other (See Comments)    Made the patient jittery  . Other Hives and Other (See Comments)    [place]: Reaction is hives [employer] tape: Reaction is blistering   . Sulfa Antibiotics Hives  . Sulfa Drugs Cross Reactors Hives     Home Medications  Prior to Admission medications   Medication Sig Start Date End Date Taking? Authorizing Provider  ascorbic acid (VITAMIN C) 500 MG tablet Take 500 mg by mouth  daily.   Yes [provider]  azaTHIOprine (IMURAN) 50 MG tablet Take 2 tablets (100 mg total) by mouth daily. 07/24/18  Yes Annia Belt, MD  cholecalciferol (VITAMIN D) 400 UNITS TABS Take 400 Units by mouth daily.   Yes [provider]  cyanocobalamin 1000 MCG tablet Take 1,000 mcg by mouth daily.   Yes [provider]  FLUoxetine (PROZAC) 20 MG capsule Take 20 mg by mouth daily.  Yes Kirk Ruths, MD  gabapentin (NEURONTIN) 600 MG tablet Take 600 mg by mouth 3 (three) times daily.   Yes [provider]  Multiple Vitamin (MULTIVITAMIN) tablet Take 1 tablet by mouth daily.    Yes [provider]  Nicotine (NICODERM CQ TD) Place 1 patch onto the skin See admin instructions. Apply 1 patch onto the skin daily- remove as directed- ONLY WHEN HOSPITALIZED   Yes [provider]  polyethylene glycol (MIRALAX / GLYCOLAX) packet Take 17 g by mouth daily. Patient taking differently: No sig reported 04/08/17  Yes Earleen Newport, MD  QUEtiapine (SEROQUEL) 25 MG tablet Take 25 mg by mouth at bedtime.   Yes [provider]  rosuvastatin (CRESTOR) 20 MG tablet Take 20 mg by mouth at bedtime. 05/22/20  Yes [provider]  tiotropium (SPIRIVA) 18 MCG inhalation capsule Place 18 mcg into inhaler and inhale daily.   Yes [provider]  traMADol (ULTRAM) 50 MG tablet Take 1 tablet (50 mg total) by mouth every 6 (six) hours as needed. Patient taking differently: Take 50 mg by mouth 2 (two) times daily. 02/25/18  Yes Fisher, Linden Dolin, PA-C  folic acid (FOLVITE) 1 MG tablet Take 1 mg by mouth daily.    [provider]           Kennieth Rad, ACNP [facility] 07/18/2020, 12:09 PM

## 2020-07-18 NOTE — Progress Notes (Signed)
Patient to dialysis with transport

## 2020-07-19 DIAGNOSIS — M3119 Other thrombotic microangiopathy: Secondary | ICD-10-CM | POA: Diagnosis not present

## 2020-07-19 LAB — THERAPEUTIC PLASMA EXCHANGE (BLOOD BANK)
Plasma Exchange: 2399
Plasma volume needed: 2400
Plasma volume needed: 2400
Unit division: 0
Unit division: 0
Unit division: 0
Unit division: 0
Unit division: 0
Unit division: 0
Unit division: 0
Unit division: 0
Unit division: 0
Unit division: 0
Unit division: 0

## 2020-07-19 LAB — COMPREHENSIVE METABOLIC PANEL
ALT: 52 U/L — ABNORMAL HIGH (ref 0–44)
AST: 58 U/L — ABNORMAL HIGH (ref 15–41)
Albumin: 2.8 g/dL — ABNORMAL LOW (ref 3.5–5.0)
Alkaline Phosphatase: 40 U/L (ref 38–126)
Anion gap: 7 (ref 5–15)
BUN: 27 mg/dL — ABNORMAL HIGH (ref 8–23)
CO2: 33 mmol/L — ABNORMAL HIGH (ref 22–32)
Calcium: 9.5 mg/dL (ref 8.9–10.3)
Chloride: 99 mmol/L (ref 98–111)
Creatinine, Ser: 1.22 mg/dL — ABNORMAL HIGH (ref 0.44–1.00)
GFR, Estimated: 45 mL/min — ABNORMAL LOW (ref >60)
Glucose, Bld: 100 mg/dL — ABNORMAL HIGH (ref 70–99)
Potassium: 4.6 mmol/L (ref 3.5–5.1)
Sodium: 139 mmol/L (ref 135–145)
Total Bilirubin: 0.8 mg/dL (ref 0.3–1.2)
Total Protein: 5.2 g/dL — ABNORMAL LOW (ref 6.5–8.1)

## 2020-07-19 LAB — CBC WITH DIFFERENTIAL/PLATELET
Abs Immature Granulocytes: 0.05 10*3/uL (ref 0.00–0.07)
Basophils Absolute: 0 10*3/uL (ref 0.0–0.1)
Basophils Relative: 0 %
Eosinophils Absolute: 0 10*3/uL (ref 0.0–0.5)
Eosinophils Relative: 0 %
HCT: 35.6 % — ABNORMAL LOW (ref 36.0–46.0)
Hemoglobin: 11.9 g/dL — ABNORMAL LOW (ref 12.0–15.0)
Immature Granulocytes: 1 %
Lymphocytes Relative: 10 %
Lymphs Abs: 0.6 10*3/uL — ABNORMAL LOW (ref 0.7–4.0)
MCH: 32 pg (ref 26.0–34.0)
MCHC: 33.4 g/dL (ref 30.0–36.0)
MCV: 95.7 fL (ref 80.0–100.0)
Monocytes Absolute: 0.4 10*3/uL (ref 0.1–1.0)
Monocytes Relative: 7 %
Neutro Abs: 4.8 10*3/uL (ref 1.7–7.7)
Neutrophils Relative %: 82 %
Platelets: 40 10*3/uL — ABNORMAL LOW (ref 150–400)
RBC: 3.72 MIL/uL — ABNORMAL LOW (ref 3.87–5.11)
RDW: 14.5 % (ref 11.5–15.5)
WBC: 5.9 10*3/uL (ref 4.0–10.5)
nRBC: 0 % (ref 0.0–0.2)

## 2020-07-19 LAB — URINALYSIS, ROUTINE W REFLEX MICROSCOPIC
Bilirubin Urine: NEGATIVE
Glucose, UA: NEGATIVE mg/dL
Hgb urine dipstick: NEGATIVE
Ketones, ur: NEGATIVE mg/dL
Leukocytes,Ua: NEGATIVE
Nitrite: NEGATIVE
Protein, ur: NEGATIVE mg/dL
Specific Gravity, Urine: 1.015 (ref 1.005–1.030)
pH: 6 (ref 5.0–8.0)

## 2020-07-19 LAB — LACTATE DEHYDROGENASE: LDH: 195 U/L — ABNORMAL HIGH (ref 98–192)

## 2020-07-19 MED ORDER — ACETAMINOPHEN 325 MG PO TABS
650.0000 mg | ORAL_TABLET | ORAL | Status: DC | PRN
  Administered 2020-07-19: 650 mg via ORAL

## 2020-07-19 MED ORDER — ACD FORMULA A 0.73-2.45-2.2 GM/100ML VI SOLN
1000.0000 mL | Status: DC
  Administered 2020-07-19: 1000 mL
  Filled 2020-07-19: qty 1000

## 2020-07-19 MED ORDER — POLYETHYLENE GLYCOL 3350 17 G PO PACK: 17.0000 g | PACK | Freq: Every day | ORAL | Status: DC | PRN

## 2020-07-19 MED ORDER — ACETAMINOPHEN 325 MG PO TABS
ORAL_TABLET | ORAL | Status: AC
  Filled 2020-07-19: qty 2

## 2020-07-19 MED ORDER — DIPHENHYDRAMINE HCL 25 MG PO CAPS
ORAL_CAPSULE | ORAL | Status: AC
  Filled 2020-07-19: qty 1

## 2020-07-19 MED ORDER — ANTICOAGULANT SODIUM CITRATE 4% (200MG/5ML) IV SOLN
5.0000 mL | Freq: Once | Status: AC
  Administered 2020-07-19: 5 mL
  Filled 2020-07-19: qty 5

## 2020-07-19 MED ORDER — CALCIUM CARBONATE ANTACID 500 MG PO CHEW
2.0000 | CHEWABLE_TABLET | ORAL | Status: AC
  Administered 2020-07-19: 400 mg via ORAL

## 2020-07-19 MED ORDER — CALCIUM GLUCONATE-NACL 2-0.675 GM/100ML-% IV SOLN
2.0000 g | Freq: Once | INTRAVENOUS | Status: AC
  Administered 2020-07-19: 2000 mg via INTRAVENOUS
  Filled 2020-07-19: qty 100

## 2020-07-19 MED ORDER — CALCIUM GLUCONATE-NACL 2-0.675 GM/100ML-% IV SOLN
INTRAVENOUS | Status: AC
  Filled 2020-07-19: qty 100

## 2020-07-19 MED ORDER — ACD FORMULA A 0.73-2.45-2.2 GM/100ML VI SOLN
Status: AC
  Filled 2020-07-19: qty 500

## 2020-07-19 MED ORDER — CALCIUM CARBONATE ANTACID 500 MG PO CHEW
CHEWABLE_TABLET | ORAL | Status: AC
  Filled 2020-07-19: qty 2

## 2020-07-19 MED ORDER — DIPHENHYDRAMINE HCL 25 MG PO CAPS
25.0000 mg | ORAL_CAPSULE | Freq: Four times a day (QID) | ORAL | Status: DC | PRN
  Administered 2020-07-19: 25 mg via ORAL

## 2020-07-19 NOTE — Progress Notes (Signed)
Pt completed TPE treatment without issue.

## 2020-07-19 NOTE — Progress Notes (Signed)
MADELEINE FENN   DOB:[birth year]   LH#:[id number]   LXB#:[id number]  Subjective:  Ms Bognar allowed me to see her today. She was siting on the edge of the bed. Nurse in room. Patient tells me she had a "seizure" with her first pheresis yesterday (see yesterday's note-- no seizure activity noted by staff and patient never lost consciousness). The second pheresis she received yesterday went "great." She is costipated. Daughter not in room   Objective: White woman examined sitting on edge of bed Vitals:   07/19/20 0519 07/19/20 0746  BP: (!) 118/55   Pulse:    Resp: 20   Temp: 98.4 F (36.9 C)   SpO2: 93% 92%    There is no height or weight on file to calculate BMI.  Intake/Output Summary (Last 24 hours) at 07/19/2020 0846 Last data filed at 07/19/2020 0500 Gross per 24 hour  Intake 290 ml  Output 900 ml  Net -610 ml    Lungs no rales or rhonchi Heart regular rate and rhythm Abd soft, nontender Neuro: nonfocal, well oriented Breasts: Deferred    CBG (last 3)  No results for input(s): GLUCAP in the last 72 hours.   Labs:  Lab Results  Component Value Date   WBC 5.9 07/19/2020   HGB 11.9 (L) 07/19/2020   HCT 35.6 (L) 07/19/2020   MCV 95.7 07/19/2020   PLT 40 (L) 07/19/2020   NEUTROABS 4.8 07/19/2020    @LASTCHEMISTRY @  Urine Studies No results for input(s): UHGB, CRYS in the last 72 hours.  Invalid input(s): UACOL, UAPR, USPG, UPH, UTP, UGL, UKET, UBIL, UNIT, UROB, [place], UEPI, UWBC, [place], [place], [place], [place], [place]  Basic Metabolic Panel: Recent Labs  Lab 07/13/20 1423 07/16/20 1027 07/17/20 0100 07/18/20 0007 07/19/20 0540  NA 139 137 134* 138 139  K 4.8 4.5 4.9 6.0* 4.6  CL 106 102 99 102 99  CO2 28 27 28 29  33*  GLUCOSE 99 195* 204* 143* 100*  BUN 27* 26* 27* 32* 27*  CREATININE 1.42* 1.52* 1.54* 1.37* 1.22*  CALCIUM 9.3 9.0 8.9 9.2 9.5   GFR Estimated Creatinine Clearance: 37.1 mL/min (A) (by C-G formula based on SCr of 1.22 mg/dL (H)). Liver Function  Tests: Recent Labs  Lab 07/13/20 1423 07/16/20 1027 07/17/20 0100 07/18/20 0007 07/19/20 0540  AST 18 48* 65* 107* 58*  ALT 19 32 42 45* 52*  ALKPHOS 64 51 48 62 40  BILITOT 0.5 0.9 1.4* 1.6* 0.8  PROT 7.0 6.8 6.2* 5.3* 5.2*  ALBUMIN 3.7 3.6 3.4* 3.1* 2.8*   No results for input(s): LIPASE, AMYLASE in the last 168 hours. No results for input(s): AMMONIA in the last 168 hours. Coagulation profile Recent Labs  Lab 07/16/20 1833  INR 1.1    CBC: Recent Labs  Lab 07/13/20 1423 07/16/20 1027 07/17/20 0100 07/18/20 0007 07/19/20 0540  WBC 6.0 3.4* 2.7* 3.8* 5.9  NEUTROABS 4.6 2.8 2.3 3.0 4.8  HGB 14.7 14.7 13.8 13.1 11.9*  HCT 44.7 44.6 41.0 38.3 35.6*  MCV 98.2 95.9 96.0 94.6 95.7  PLT 81* 17* 9* 20* 40*   Cardiac Enzymes: No results for input(s): CKTOTAL, CKMB, CKMBINDEX, TROPONINI in the last 168 hours. BNP: Invalid input(s): POCBNP CBG: No results for input(s): GLUCAP in the last 168 hours. D-Dimer No results for input(s): DDIMER in the last 72 hours. Hgb A1c No results for input(s): HGBA1C in the last 72 hours. Lipid Profile No results for input(s): CHOL, HDL, LDLCALC, TRIG, CHOLHDL, LDLDIRECT in the  last 72 hours. Thyroid function studies No results for input(s): TSH, T4TOTAL, T3FREE, THYROIDAB in the last 72 hours.  Invalid input(s): FREET3 Anemia work up Recent Labs    07/16/20 1028  RETICCTPCT 1.5   Microbiology Recent Results (from the past 240 hour(s))  SARS CORONAVIRUS 2 (TAT 6-24 HRS) Nasopharyngeal Nasopharyngeal Swab     Status: None   Collection Time: 07/16/20  7:26 PM   Specimen: Nasopharyngeal Swab  Result Value Ref Range Status   SARS Coronavirus 2 NEGATIVE NEGATIVE Final    Comment: (NOTE) SARS-CoV-2 target nucleic acids are NOT DETECTED.  The SARS-CoV-2 RNA is generally detectable in upper and lower respiratory specimens during the acute phase of infection. Negative results do not preclude SARS-CoV-2 infection, do not rule  out co-infections with other pathogens, and should not be used as the sole basis for treatment or other patient management decisions. Negative results must be combined with clinical observations, patient history, and epidemiological information. The expected result is Negative.  Fact Sheet for Patients: SugarRoll.be  Fact Sheet for Healthcare Providers: https://www.woods-mathews.com/  This test is not yet approved or cleared by the [place] FDA and  has been authorized for detection and/or diagnosis of SARS-CoV-2 by FDA under an Emergency Use Authorization (EUA). This EUA will remain  in effect (meaning this test can be used) for the duration of the COVID-19 declaration under Se ction 564(b)(1) of the Act, 21 U.S.C. section 360bbb-3(b)(1), unless the authorization is terminated or revoked sooner.  Performed at [facility], [place] [address], [place], [place] [address]   Gram stain     Status: None   Collection Time: 07/18/20 11:11 AM   Specimen: Fluid  Result Value Ref Range Status   Specimen Description FLUID  Final   Special Requests PLASMA UNIT [id number]  Final   Gram Stain   Final    NO WBC SEEN NO ORGANISMS SEEN Performed at [facility], [place] [address] [place], [place] [address]    Report Status 07/18/2020 FINAL  Final  Culture, body fluid w Gram Stain-bottle     Status: None (Preliminary result)   Collection Time: 07/18/20 11:11 AM   Specimen: Fluid  Result Value Ref Range Status   Specimen Description FLUID  Final   Special Requests   Final    PLASMA UNIT [id number] Performed at [facility], [place] [address], [place], [place] [address]    Culture PENDING  Incomplete   Report Status PENDING  Incomplete      Studies:  No results found.  Assessment: 80 y.o. [place], [place] woman  1. TTP diagnosed in 2005, treated with plasma exchange, steroids, and rituximab consolidation ? Relapse of TTP  October 2011-plasma exchange, steroids, rituximab ? Relapse of TTP March 2015-plasma exchange, steroids, rituximab, and maintenance azathioprine ? Admission with severe thrombocytopenia and elevated LDH 07/16/2020, consistent with relapse of TTP       -Steroids/FFP given 07/16/2020       -Daily plasma exchange beginning 07/17/2020 2. History of epidural abscess requiring laminectomy 2012 3. 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 COPD    Plan:  [name] tolerated her 2d pheresis yesterday w/o event. I let her know the HD team brought an extra nurse today just so we would be able to pherese her on Sunday--I am very grateful to [place] [employer] for accomplishing that. I gave her a copy of her  labs which show platelets up to 40 K, improving LFTs and creatinine.  The patient states "Dr Beryle Beams would let me go home even if the numbers were not perfect." I assured her Dr Eddie North will discharge her as soon as it is safe for him to do so.  She will have some docusate today and I asked her to let us know if there was anything ele we could do to make her stay here easier  Plan is to continue daily pheresis as ordered. Appreciate hospitalist's help to this patient and her family!   Chauncey Cruel, MD 07/19/2020  8:46 AM Medical Oncology and Hematology [facility] [address] [place], [place] [address] Tel. [phone]    Fax. [phone]

## 2020-07-19 NOTE — Progress Notes (Signed)
PROGRESS NOTE    [name]  CZY:[id number] DOB: 1941-03-27 DOA: 07/16/2020 PCP: Kirk Ruths, MD    No chief complaint on file.   Brief Narrative:   [name] is a 80 y.o. 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  Her daughter reports that she has had 5 prior bouts of TTP.  She lost taste/smell last week - this has happened with prior episodes but they also repeatedly tested for COVID.  She felt better and then in the last few days developed fine petechiae on her abdomen; headache; ataxia (listing to the left); and diffuse large joint pains.  She did have some mild LE bruising.  They suspected TTP recurrence and so went to see hematology today.  On prior episodes, she usually needs hospitalization for 72 hours or so and then outpatient phoresis.  She smokes 2-3 ppd and drinks 1 beer per night.  ED Course:  Direct admission from Dr. Benay Spice -  Admit for  recurrent TTP.  Needs emergent plasmapheresis.  Dr. Benay Spice managing   Subjective:     TPE terminated early yesterday morning due to possible reaction, she tolerated TPE yesterday evening and this morning She currently denies headache, no confusion, no fever, no sign of bleeding She state is feeling better and is  Asking  when she can go home  Assessment & Plan:   Principal Problem:   TTP (thrombotic thrombocytopenic purpura) Active Problems:   COPD (chronic obstructive pulmonary disease) (HCC)   Tobacco abuse   Chronic pain disorder   Mood disorder (HCC)  Recurrent TTP -First diagnosed in 2005, received plasma exchange, steroids and rituximab in the past -She received FFP x2, Solu-Medrol, plasmapheresis daily started on 3/18 -Platelet nadir at 9 , platelet number improving, today is 40, other parameters improving as well -Management per hematology  -fall  precaution, bedrest until platelet number improves  Possible transfusion reaction vs endotoxin Vs TTP symptoms -Plasmapheresis stopped due to acute onset of rigors/ shaking chills during TPE on 3/19 am -Symptom resolved quickly after Benadryl, steroid, epi, calcium gluconate, critical care consulted -Sample sent to blood bank for transfusion reaction investigation, TPE set up culture in process, Blood culture obtained,  --She is to transfer to progressive unit for close monitoring -no issues with TPE on 3/19 pm and 3/20 am -Nephrology and critical care input appreciated  COPD, current smoker Stable, no wheezing, no hypoxia Continue home medication Encourage smoking cessation  History of epidural abscess, Enterococcus sepsis, endocarditis in 2012 after laminectomy No sign of infection currently  Chronic back pain Appears at baseline, continue Ultram  Constipation; started on MiraLAX  Mood disorder Continue Prozac and Seroquel     Unresulted Labs (From admission, onward)          Start     Ordered   07/18/20 1117  Culture, blood (routine x 2)  BLOOD CULTURE X 2,   R (with TIMED occurrences)      07/18/20 1115   07/17/20 0500  CBC with Differential  Daily,   R      07/16/20 1627   07/17/20 0500  Comprehensive metabolic panel  Daily,   R      07/16/20 1627   07/17/20 0500  Lactate dehydrogenase  Daily,   R      07/16/20 1627   07/16/20 2009  Urinalysis, Complete w Microscopic Urine, Clean Catch  Once,   R        07/16/20 2008            DVT prophylaxis: Place and maintain sequential compression device Start: 07/17/20 1348 SCDs Start: 07/16/20 1808   Code Status: Full Family Communication: Husband at bedside on 3/18, daughter at bedside on 3/19 Disposition:   Status is: Inpatient  Dispo: The patient is from: home               Anticipated d/c is to: home              Anticipated d/c date is: 24 to 48 hours, need hematology clearance                 Consultants:   Hematology   Nephrology  Critical care  Procedures:   Plasmapheresis  Antimicrobials:   . Anti-infectives (From admission, onward)   None          Objective: Vitals:   07/19/20 1231 07/19/20 1240 07/19/20 1249 07/19/20 1252  BP: (!) 130/55 (!) 136/51 (!) 130/51 (!) 132/56  Pulse: 63 62 63 68  Resp:      Temp: 98.7 F (37.1 C) 98.7 F (37.1 C) 98.7 F (37.1 C) 98.6 F (37 C)  TempSrc:      SpO2:    98%  Weight:        Intake/Output Summary (Last 24 hours) at 07/19/2020 1349 Last data filed at 07/19/2020 0900 Gross per 24 hour  Intake 530 ml  Output 900 ml  Net -370 ml   Filed Weights   07/19/20 1058  Weight: 73 kg    Examination:  General exam: calm, NAD Respiratory system: Clear to auscultation. Respiratory effort normal. Cardiovascular system: S1 & S2 heard, RRR.  +precordial murmur ( reports chronic), No pedal edema. Gastrointestinal system: Abdomen is nondistended, soft and nontender. . Normal bowel sounds heard. Central nervous system: Alert and oriented. No focal neurological deficits. Extremities: Symmetric 5 x 5 power. Skin: No rashes, lesions or ulcers Psychiatry: Judgement and insight appear normal. Mood & affect appropriate.     Data Reviewed: I have personally reviewed following labs and imaging studies  CBC: Recent Labs  Lab 07/13/20 1423 07/16/20 1027 07/17/20 0100 07/18/20 0007 07/19/20 0540  WBC 6.0 3.4* 2.7* 3.8* 5.9  NEUTROABS 4.6 2.8 2.3 3.0 4.8  HGB 14.7 14.7 13.8 13.1 11.9*  HCT 44.7 44.6 41.0 38.3 35.6*  MCV 98.2 95.9 96.0 94.6 95.7  PLT 81* 17* 9* 20* 40*    Basic Metabolic Panel: Recent Labs  Lab 07/13/20 1423 07/16/20 1027 07/17/20 0100 07/18/20 0007 07/19/20 0540  NA 139 137 134* 138 139  K 4.8 4.5 4.9 6.0* 4.6  CL 106 102 99 102 99  CO2 28 27 28 29  33*  GLUCOSE 99 195* 204* 143* 100*  BUN 27* 26* 27* 32* 27*  CREATININE 1.42* 1.52* 1.54* 1.37* 1.22*  CALCIUM 9.3 9.0 8.9 9.2 9.5     GFR: Estimated Creatinine Clearance: 37.2 mL/min (A) (by C-G formula based on SCr of 1.22 mg/dL (H)).  Liver Function Tests: Recent Labs  Lab 07/13/20 1423 07/16/20 1027 07/17/20 0100 07/18/20 0007 07/19/20 0540  AST 18 48* 65* 107* 58*  ALT 19 32 42 45* 52*  ALKPHOS 64 51 48 62 40  BILITOT 0.5 0.9 1.4* 1.6* 0.8  PROT 7.0 6.8 6.2* 5.3* 5.2*  ALBUMIN 3.7 3.6 3.4* 3.1* 2.8*    CBG: No results for input(s): GLUCAP in the last 168  hours.   Recent Results (from the past 240 hour(s))  SARS CORONAVIRUS 2 (TAT 6-24 HRS) Nasopharyngeal Nasopharyngeal Swab     Status: None   Collection Time: 07/16/20  7:26 PM   Specimen: Nasopharyngeal Swab  Result Value Ref Range Status   SARS Coronavirus 2 NEGATIVE NEGATIVE Final    Comment: (NOTE) SARS-CoV-2 target nucleic acids are NOT DETECTED.  The SARS-CoV-2 RNA is generally detectable in upper and lower respiratory specimens during the acute phase of infection. Negative results do not preclude SARS-CoV-2 infection, do not rule out co-infections with other pathogens, and should not be used as the sole basis for treatment or other patient management decisions. Negative results must be combined with clinical observations, patient history, and epidemiological information. The expected result is Negative.  Fact Sheet for Patients: SugarRoll.be  Fact Sheet for Healthcare Providers: https://www.woods-mathews.com/  This test is not yet approved or cleared by the [place] FDA and  has been authorized for detection and/or diagnosis of SARS-CoV-2 by FDA under an Emergency Use Authorization (EUA). This EUA will remain  in effect (meaning this test can be used) for the duration of the COVID-19 declaration under Se ction 564(b)(1) of the Act, 21 U.S.C. section 360bbb-3(b)(1), unless the authorization is terminated or revoked sooner.  Performed at [facility], [place] [address],  [place], [place] [address]   Gram stain     Status: None   Collection Time: 07/18/20 11:11 AM   Specimen: Fluid  Result Value Ref Range Status   Specimen Description FLUID  Final   Special Requests PLASMA UNIT [id number]  Final   Gram Stain   Final    NO WBC SEEN NO ORGANISMS SEEN Performed at [facility], [place] [address], [place], [place] [address]    Report Status 07/18/2020 FINAL  Final  Culture, body fluid w Gram Stain-bottle     Status: None (Preliminary result)   Collection Time: 07/18/20 11:11 AM   Specimen: Fluid  Result Value Ref Range Status   Specimen Description FLUID  Final   Special Requests   Final    PLASMA UNIT [id number] Performed at [facility], [place] [address] [place], [place] [address]    Culture PENDING  Incomplete   Report Status PENDING  Incomplete         Radiology Studies: No results found.      Scheduled Meds: . calcium carbonate  2 tablet Oral Q3H  . Chlorhexidine Gluconate Cloth  6 each Topical Q0600  . docusate sodium  100 mg Oral BID  . feeding supplement  237 mL Oral BID BM  . FLUoxetine  20 mg Oral Daily  . gabapentin  300 mg Oral BID  . methylPREDNISolone (SOLU-MEDROL) injection  80 mg Intravenous Daily  . multivitamin with minerals  1 tablet Oral Daily  . nicotine  21 mg Transdermal Daily  . QUEtiapine  25 mg Oral QHS  . rosuvastatin  20 mg Oral QHS  . sodium chloride flush  3 mL Intravenous Q12H  . umeclidinium bromide  1 puff Inhalation Daily   Continuous Infusions: . calcium gluconate    . citrate dextrose    . lactated ringers 75 mL/hr at 07/18/20 0509     LOS: 3 days   Time spent: 66mins, case discussed with  Hematology Dr. Jana Hakim Greater than 50% of this time was spent in counseling, explanation of diagnosis, planning of further management, and coordination of care.   Voice Recognition Viviann Spare dictation system  was used to create this note, attempts have been made to correct errors. Please contact  the author with questions and/or clarifications.   Florencia Reasons, MD PhD FACP [facility]  Available via Epic secure chat 7am-7pm for nonurgent issues Please page for urgent issues To page the attending provider between 7A-7P or the covering provider during after hours 7P-7A, please log into the web site [email or web address] and access using universal [place] password for that web site. If you do not have the password, please call the hospital operator.    07/19/2020, 1:49 PM

## 2020-07-20 DIAGNOSIS — M3119 Other thrombotic microangiopathy: Secondary | ICD-10-CM | POA: Diagnosis not present

## 2020-07-20 LAB — CBC WITH DIFFERENTIAL/PLATELET
Abs Immature Granulocytes: 0.13 10*3/uL — ABNORMAL HIGH (ref 0.00–0.07)
Basophils Absolute: 0 10*3/uL (ref 0.0–0.1)
Basophils Relative: 1 %
Eosinophils Absolute: 0 10*3/uL (ref 0.0–0.5)
Eosinophils Relative: 0 %
HCT: 33.9 % — ABNORMAL LOW (ref 36.0–46.0)
Hemoglobin: 11.3 g/dL — ABNORMAL LOW (ref 12.0–15.0)
Immature Granulocytes: 2 %
Lymphocytes Relative: 21 %
Lymphs Abs: 1.5 10*3/uL (ref 0.7–4.0)
MCH: 32.1 pg (ref 26.0–34.0)
MCHC: 33.3 g/dL (ref 30.0–36.0)
MCV: 96.3 fL (ref 80.0–100.0)
Monocytes Absolute: 0.7 10*3/uL (ref 0.1–1.0)
Monocytes Relative: 10 %
Neutro Abs: 4.7 10*3/uL (ref 1.7–7.7)
Neutrophils Relative %: 66 %
Platelets: 65 10*3/uL — ABNORMAL LOW (ref 150–400)
RBC: 3.52 MIL/uL — ABNORMAL LOW (ref 3.87–5.11)
RDW: 14.2 % (ref 11.5–15.5)
WBC: 7.1 10*3/uL (ref 4.0–10.5)
nRBC: 0 % (ref 0.0–0.2)

## 2020-07-20 LAB — THERAPEUTIC PLASMA EXCHANGE (BLOOD BANK)
Plasma Exchange: 2600
Unit division: 0
Unit division: 0

## 2020-07-20 LAB — COMPREHENSIVE METABOLIC PANEL
ALT: 61 U/L — ABNORMAL HIGH (ref 0–44)
AST: 63 U/L — ABNORMAL HIGH (ref 15–41)
Albumin: 2.7 g/dL — ABNORMAL LOW (ref 3.5–5.0)
Alkaline Phosphatase: 55 U/L (ref 38–126)
Anion gap: 4 — ABNORMAL LOW (ref 5–15)
BUN: 31 mg/dL — ABNORMAL HIGH (ref 8–23)
CO2: 34 mmol/L — ABNORMAL HIGH (ref 22–32)
Calcium: 9.1 mg/dL (ref 8.9–10.3)
Chloride: 101 mmol/L (ref 98–111)
Creatinine, Ser: 1.27 mg/dL — ABNORMAL HIGH (ref 0.44–1.00)
GFR, Estimated: 43 mL/min — ABNORMAL LOW (ref >60)
Glucose, Bld: 88 mg/dL (ref 70–99)
Potassium: 4.1 mmol/L (ref 3.5–5.1)
Sodium: 139 mmol/L (ref 135–145)
Total Bilirubin: 0.6 mg/dL (ref 0.3–1.2)
Total Protein: 4.7 g/dL — ABNORMAL LOW (ref 6.5–8.1)

## 2020-07-20 LAB — LACTATE DEHYDROGENASE: LDH: 207 U/L — ABNORMAL HIGH (ref 98–192)

## 2020-07-20 MED ORDER — CALCIUM GLUCONATE-NACL 2-0.675 GM/100ML-% IV SOLN
INTRAVENOUS | Status: AC
  Administered 2020-07-20: 2000 mg via INTRAVENOUS
  Filled 2020-07-20: qty 100

## 2020-07-20 MED ORDER — ACETAMINOPHEN 325 MG PO TABS
ORAL_TABLET | ORAL | Status: AC
  Administered 2020-07-20: 650 mg via ORAL
  Filled 2020-07-20: qty 2

## 2020-07-20 MED ORDER — ANTICOAGULANT SODIUM CITRATE 4% (200MG/5ML) IV SOLN
5.0000 mL | Freq: Once | Status: DC
  Filled 2020-07-20 (×2): qty 5

## 2020-07-20 MED ORDER — ACD FORMULA A 0.73-2.45-2.2 GM/100ML VI SOLN
1000.0000 mL | Status: DC
  Filled 2020-07-20: qty 1000

## 2020-07-20 MED ORDER — DIPHENHYDRAMINE HCL 25 MG PO CAPS: 25.0000 mg | ORAL_CAPSULE | Freq: Four times a day (QID) | ORAL | Status: DC | PRN

## 2020-07-20 MED ORDER — CALCIUM CARBONATE ANTACID 500 MG PO CHEW: 2.0000 | CHEWABLE_TABLET | ORAL | Status: AC

## 2020-07-20 MED ORDER — ACD FORMULA A 0.73-2.45-2.2 GM/100ML VI SOLN
Status: AC
  Administered 2020-07-20: 1000 mL
  Filled 2020-07-20: qty 500

## 2020-07-20 MED ORDER — DIPHENHYDRAMINE HCL 25 MG PO CAPS
ORAL_CAPSULE | ORAL | Status: AC
  Administered 2020-07-20: 25 mg via ORAL
  Filled 2020-07-20: qty 1

## 2020-07-20 MED ORDER — CALCIUM CARBONATE ANTACID 500 MG PO CHEW
CHEWABLE_TABLET | ORAL | Status: AC
  Administered 2020-07-20: 400 mg via ORAL
  Filled 2020-07-20: qty 4

## 2020-07-20 MED ORDER — CALCIUM GLUCONATE-NACL 2-0.675 GM/100ML-% IV SOLN
2.0000 g | Freq: Once | INTRAVENOUS | Status: AC
  Filled 2020-07-20: qty 100

## 2020-07-20 MED ORDER — ACETAMINOPHEN 325 MG PO TABS: 650.0000 mg | ORAL_TABLET | ORAL | Status: DC | PRN

## 2020-07-20 MED ORDER — PREDNISONE 50 MG PO TABS
60.0000 mg | ORAL_TABLET | Freq: Every day | ORAL | Status: DC
  Administered 2020-07-21: 60 mg via ORAL
  Filled 2020-07-20: qty 1

## 2020-07-20 NOTE — Progress Notes (Signed)
PROGRESS NOTE    [name]  GDJ:[id number] DOB: 08/10/1940 DOA: 07/16/2020 PCP: Kirk Ruths, MD   Brief Narrative: Rin Krystal Waughis a 80 y.o.femalewith medical history significant ofCOPD; chronic back pain; h/o epidural abscess, enterococcal sepsis and endocarditis (2012); and recurrent TTP presented with n/v and ataxia.Her daughter reported that she has had 5 prior bouts of TTP. She developed fine petechiae on her abdomen; headache; ataxia (listing to the left); and diffuse large joint pains. She did have some mild LE bruising. They suspected TTP recurrence and so went to her hematologist Dr Learta Codding.  We recommended direct admission for emergent plasmapheresis.  Platelets level improving.  Plan for another session of plasmapheresis today.  Possible plan for discharge tomorrow   Assessment & Plan:   Principal Problem:   TTP (thrombotic thrombocytopenic purpura) Active Problems:   COPD (chronic obstructive pulmonary disease) (HCC)   Tobacco abuse   Chronic pain disorder   Mood disorder (HCC)  Recurrent TTP -First diagnosed in 2005, received plasma exchange, steroids and rituximab in the past -She received FFP x2, Solu-Medrol, plasmapheresis daily started on 3/18 -Platelet nadir at 9 , platelet number improving, today is 65, other parameters improving as well -Management per hematology  -Plan for another session plasmapheresis today.  Target platelets is 100,000  Possible transfusion reaction vs endotoxin Vs TTP symptoms -Plasmapheresis stopped due to acute onset of rigors/ shaking chills during TPE on 3/19 am -Symptom resolved quickly after Benadryl, steroid, epi, calcium gluconate, critical care consulted -Sample sent to blood bank for transfusion reaction investigation, TPE set up culture in process, Blood culture obtained,  --She was  transferred to progressive unit for close monitoring -no issues with TPE on 3/19 pm and 3/20 am  COPD, current  smoker Stable, no wheezing, no hypoxia Continue home medicationd Encouraged smoking cessation  History of epidural abscess, Enterococcus sepsis, endocarditis in 2012 after laminectomy No sign of infection currently  Chronic back pain Appears at baseline, continue Ultram  Constipation; started on MiraLAX  Mood disorder Continue Prozac and Seroquel  Nutrition Problem: Inadequate oral intake Etiology: decreased appetite,acute illness (altered taste)      DVT prophylaxis:SCD Code Status: Full Family Communication: daughter at bedside Status is: Inpatient  Remains inpatient appropriate because:Inpatient level of care appropriate due to severity of illness   Dispo: The patient is from: Home              Anticipated d/c is to: Home              Patient currently is not medically stable to d/c.   Difficult to place patient No    Consultants: Hematology,nephrology  Procedures:Plasmapheresis  Antimicrobials:  Anti-infectives (From admission, onward)   None      Subjective:  Patient seen and examined the bedside this morning.  Hemodynamically stable.  Comfortable denies any issues  Objective: Vitals:   07/20/20 0026 07/20/20 0444 07/20/20 0447 07/20/20 0841  BP: (!) 119/46  131/72   Pulse:      Resp: 20  16   Temp: 98.4 F (36.9 C)  98.7 F (37.1 C)   TempSrc: Oral  Oral   SpO2: 92%  94% (!) 89%  Weight:  76.2 kg      Intake/Output Summary (Last 24 hours) at 07/20/2020 1120 Last data filed at 07/20/2020 0444 Gross per 24 hour  Intake 390 ml  Output 900 ml  Net -510 ml   Filed Weights   07/19/20 1058 07/20/20 0444  Weight: 73 kg  76.2 kg    Examination:  General exam: pleasant female HEENT:Dialysis catheter on the right neck Respiratory system: Bilateral equal air entry, normal vesicular breath sounds, no wheezes or crackles  Cardiovascular system: S1 & S2 heard, RRR. No JVD, murmurs, rubs, gallops or clicks. No pedal edema. Gastrointestinal  system: Abdomen is nondistended, soft and nontender. No organomegaly or masses felt. Normal bowel sounds heard. Central nervous system: Alert and oriented. No focal neurological deficits. Extremities: No edema, no clubbing ,no cyanosis Skin: No rashes, lesions or ulcers,no icterus ,no pallor  Data Reviewed: I have personally reviewed following labs and imaging studies  CBC: Recent Labs  Lab 07/16/20 1027 07/17/20 0100 07/18/20 0007 07/19/20 0540 07/20/20 0423  WBC 3.4* 2.7* 3.8* 5.9 7.1  NEUTROABS 2.8 2.3 3.0 4.8 4.7  HGB 14.7 13.8 13.1 11.9* 11.3*  HCT 44.6 41.0 38.3 35.6* 33.9*  MCV 95.9 96.0 94.6 95.7 96.3  PLT 17* 9* 20* 40* 65*   Basic Metabolic Panel: Recent Labs  Lab 07/16/20 1027 07/17/20 0100 07/18/20 0007 07/19/20 0540 07/20/20 0423  NA 137 134* 138 139 139  K 4.5 4.9 6.0* 4.6 4.1  CL 102 99 102 99 101  CO2 27 28 29  33* 34*  GLUCOSE 195* 204* 143* 100* 88  BUN 26* 27* 32* 27* 31*  CREATININE 1.52* 1.54* 1.37* 1.22* 1.27*  CALCIUM 9.0 8.9 9.2 9.5 9.1   GFR: Estimated Creatinine Clearance: 36.5 mL/min (A) (by C-G formula based on SCr of 1.27 mg/dL (H)). Liver Function Tests: Recent Labs  Lab 07/16/20 1027 07/17/20 0100 07/18/20 0007 07/19/20 0540 07/20/20 0423  AST 48* 65* 107* 58* 63*  ALT 32 42 45* 52* 61*  ALKPHOS 51 48 62 40 55  BILITOT 0.9 1.4* 1.6* 0.8 0.6  PROT 6.8 6.2* 5.3* 5.2* 4.7*  ALBUMIN 3.6 3.4* 3.1* 2.8* 2.7*   No results for input(s): LIPASE, AMYLASE in the last 168 hours. No results for input(s): AMMONIA in the last 168 hours. Coagulation Profile: Recent Labs  Lab 07/16/20 1833  INR 1.1   Cardiac Enzymes: No results for input(s): CKTOTAL, CKMB, CKMBINDEX, TROPONINI in the last 168 hours. BNP (last 3 results) No results for input(s): PROBNP in the last 8760 hours. HbA1C: No results for input(s): HGBA1C in the last 72 hours. CBG: No results for input(s): GLUCAP in the last 168 hours. Lipid Profile: No results for  input(s): CHOL, HDL, LDLCALC, TRIG, CHOLHDL, LDLDIRECT in the last 72 hours. Thyroid Function Tests: No results for input(s): TSH, T4TOTAL, FREET4, T3FREE, THYROIDAB in the last 72 hours. Anemia Panel: No results for input(s): VITAMINB12, FOLATE, FERRITIN, TIBC, IRON, RETICCTPCT in the last 72 hours. Sepsis Labs: No results for input(s): PROCALCITON, LATICACIDVEN in the last 168 hours.  Recent Results (from the past 240 hour(s))  SARS CORONAVIRUS 2 (TAT 6-24 HRS) Nasopharyngeal Nasopharyngeal Swab     Status: None   Collection Time: 07/16/20  7:26 PM   Specimen: Nasopharyngeal Swab  Result Value Ref Range Status   SARS Coronavirus 2 NEGATIVE NEGATIVE Final    Comment: (NOTE) SARS-CoV-2 target nucleic acids are NOT DETECTED.  The SARS-CoV-2 RNA is generally detectable in upper and lower respiratory specimens during the acute phase of infection. Negative results do not preclude SARS-CoV-2 infection, do not rule out co-infections with other pathogens, and should not be used as the sole basis for treatment or other patient management decisions. Negative results must be combined with clinical observations, patient history, and epidemiological information. The expected result is Negative.  Fact Sheet for Patients: SugarRoll.be  Fact Sheet for Healthcare Providers: https://www.woods-mathews.com/  This test is not yet approved or cleared by the [place] FDA and  has been authorized for detection and/or diagnosis of SARS-CoV-2 by FDA under an Emergency Use Authorization (EUA). This EUA will remain  in effect (meaning this test can be used) for the duration of the COVID-19 declaration under Se ction 564(b)(1) of the Act, 21 U.S.C. section 360bbb-3(b)(1), unless the authorization is terminated or revoked sooner.  Performed at [facility], [place] [address] [place], [place] [address]   Gram stain     Status: None   Collection Time:  07/18/20 11:11 AM   Specimen: Fluid  Result Value Ref Range Status   Specimen Description FLUID  Final   Special Requests PLASMA UNIT [id number]  Final   Gram Stain   Final    NO WBC SEEN NO ORGANISMS SEEN Performed at [facility], [place] [address] [place], [place] [address]    Report Status 07/18/2020 FINAL  Final  Culture, body fluid w Gram Stain-bottle     Status: None (Preliminary result)   Collection Time: 07/18/20 11:11 AM   Specimen: Fluid  Result Value Ref Range Status   Specimen Description FLUID  Final   Special Requests PLASMA UNIT [id number]  Final   Culture   Final    NO GROWTH 2 DAYS Performed at [facility], [place] [address], [place], [place] [address]    Report Status PENDING  Incomplete  Culture, blood (routine x 2)     Status: None (Preliminary result)   Collection Time: 07/18/20 12:56 PM   Specimen: BLOOD  Result Value Ref Range Status   Specimen Description BLOOD RIGHT ANTECUBITAL  Final   Special Requests   Final    BOTTLES DRAWN AEROBIC AND ANAEROBIC Blood Culture adequate volume   Culture   Final    NO GROWTH 2 DAYS Performed at [facility], [place] [address] [place], [place] [address]    Report Status PENDING  Incomplete  Culture, blood (routine x 2)     Status: None (Preliminary result)   Collection Time: 07/18/20  1:02 PM   Specimen: BLOOD RIGHT HAND  Result Value Ref Range Status   Specimen Description BLOOD RIGHT HAND  Final   Special Requests   Final    BOTTLES DRAWN AEROBIC AND ANAEROBIC Blood Culture adequate volume   Culture   Final    NO GROWTH 2 DAYS Performed at [facility], [place] [address], [place], [place] [address]    Report Status PENDING  Incomplete         Radiology Studies: No results found.      Scheduled Meds: . Chlorhexidine Gluconate Cloth  6 each Topical Q0600  . docusate sodium  100 mg Oral BID  . feeding supplement  237 mL Oral BID BM  . FLUoxetine  20 mg Oral Daily  .  gabapentin  300 mg Oral BID  . multivitamin with minerals  1 tablet Oral Daily  . nicotine  21 mg Transdermal Daily  . [START ON 07/21/2020] predniSONE  60 mg Oral Q breakfast  . QUEtiapine  25 mg Oral QHS  . rosuvastatin  20 mg Oral QHS  . sodium chloride flush  3 mL Intravenous Q12H  . umeclidinium bromide  1 puff Inhalation Daily   Continuous Infusions: . citrate dextrose    . lactated ringers 75 mL/hr at 07/18/20 [phone]  LOS: 4 days    Time spent: 25 mins.More than 50% of that time was spent in counseling and/or coordination of care.      Shelly Coss, MD [facility] P3/21/2022, 11:20 AM

## 2020-07-20 NOTE — Progress Notes (Addendum)
HEMATOLOGY-ONCOLOGY PROGRESS NOTE  SUBJECTIVE: The patient offers no complaints today.  No bleeding reported.  PHYSICAL EXAMINATION:  Vitals:   07/20/20 0447 07/20/20 0841  BP: 131/72   Pulse:    Resp: 16   Temp: 98.7 F (37.1 C)   SpO2: 94% (!) 89%   Filed Weights   07/19/20 1058 07/20/20 0444  Weight: 73 kg 76.2 kg    Intake/Output from previous day: 03/20 0701 - 03/21 0700 In: 630 [P.O.:630] Out: 900 [Urine:900]  GENERAL:alert, no distress and comfortable HEENT: No thrush Resp:  Clear to auscultation bilaterally. No respiratory distress. Cardio: Regular rate and rhythm.  Faint murmur. GI: Abdomen soft and nontender.  No hepatosplenomegaly. Vascular: No leg edema. Neuro:  Alert and oriented x3 Skin: Petechiae scattered over abdominal wall.  Ecchymoses at the lower leg bilaterally. Pheresis catheter without erythema or bleeding  LABORATORY DATA:  I have reviewed the data as listed CMP Latest Ref Rng & Units 07/20/2020 07/19/2020 07/18/2020  Glucose 70 - 99 mg/dL 88 100(H) 143(H)  BUN 8 - 23 mg/dL 31(H) 27(H) 32(H)  Creatinine 0.44 - 1.00 mg/dL 1.27(H) 1.22(H) 1.37(H)  Sodium 135 - 145 mmol/L 139 139 138  Potassium 3.5 - 5.1 mmol/L 4.1 4.6 6.0(H)  Chloride 98 - 111 mmol/L 101 99 102  CO2 22 - 32 mmol/L 34(H) 33(H) 29  Calcium 8.9 - 10.3 mg/dL 9.1 9.5 9.2  Total Protein 6.5 - 8.1 g/dL 4.7(L) 5.2(L) 5.3(L)  Total Bilirubin 0.3 - 1.2 mg/dL 0.6 0.8 1.6(H)  Alkaline Phos 38 - 126 U/L 55 40 62  AST 15 - 41 U/L 63(H) 58(H) 107(H)  ALT 0 - 44 U/L 61(H) 52(H) 45(H)    Lab Results  Component Value Date   WBC 7.1 07/20/2020   HGB 11.3 (L) 07/20/2020   HCT 33.9 (L) 07/20/2020   MCV 96.3 07/20/2020   PLT 65 (L) 07/20/2020   NEUTROABS 4.7 07/20/2020    IR US Guide Vasc Access Left  Result Date: 07/17/2020 INDICATION: 80 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 EXAM: FLUOROSCOPIC AND ULTRASOUND GUIDED PLACEMENT OF A NON-TUNNELED DIALYSIS  CATHETER Physician: Stephan Minister. Henn, MD MEDICATIONS: None ANESTHESIA/SEDATION: None FLUOROSCOPY TIME:  Fluoroscopy Time: 2 minutes, 36 seconds, 6 mGy COMPLICATIONS: None immediate. PROCEDURE: The procedure was explained to the patient. The risks and benefits of the procedure were discussed and the patient's questions were addressed. Informed consent was obtained from the patient. The patient was placed supine on the interventional table. Ultrasound confirmed a patent right internal jugular vein. The right neck was prepped and draped in a sterile fashion. The right neck was anesthetized with 1% lidocaine. Maximal barrier sterile technique was utilized including caps, mask, sterile gowns, sterile gloves, sterile drape, hand hygiene and skin antiseptic. A small incision was made with #11 blade scalpel. A 21 gauge needle directed into the right internal vein with ultrasound guidance. A micropuncture dilator set was placed but the wire would not advance centrally. Although the right internal jugular vein was patent, it was small. Eventually, this access site was abandoned. Ultrasound confirmed a patent left internal jugular vein. Ultrasound image was saved for documentation. Left side of the neck was prepped and draped in sterile fashion. Maximal barrier sterile technique was utilized including caps, mask, sterile gowns, sterile gloves, sterile drape, hand hygiene and skin antiseptic. Skin was anesthetized with 1% lidocaine. A small incision was made. Using ultrasound guidance, 21 gauge needle was directed into the left internal jugular vein. Wire was  advanced centrally. Micropuncture dilator set was placed. A J wire was advanced into the SVC. Tract was dilated. A 24 cm Mahurkar catheter was selected. The catheter was advanced over a wire and positioned at the superior cavoatrial junction. Fluoroscopic images were obtained for documentation. Both dialysis lumens were found to aspirate and flush well. The proper amount of  heparin was flushed in both lumens. The central venous lumen was flushed with normal saline. Catheter was sutured to skin. FINDINGS: Catheter tip at the superior cavoatrial junction. IMPRESSION: Successful placement of a left jugular non-tunneled dialysis catheter using ultrasound and fluoroscopic guidance. Electronically Signed   By: Markus Daft M.D.   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 EXAM: FLUOROSCOPIC AND ULTRASOUND GUIDED PLACEMENT OF A NON-TUNNELED DIALYSIS CATHETER Physician: Stephan Minister. Henn, MD MEDICATIONS: None ANESTHESIA/SEDATION: None FLUOROSCOPY TIME:  Fluoroscopy Time: 2 minutes, 36 seconds, 6 mGy COMPLICATIONS: None immediate. PROCEDURE: The procedure was explained to the patient. The risks and benefits of the procedure were discussed and the patient's questions were addressed. Informed consent was obtained from the patient. The patient was placed supine on the interventional table. Ultrasound confirmed a patent right internal jugular vein. The right neck was prepped and draped in a sterile fashion. The right neck was anesthetized with 1% lidocaine. Maximal barrier sterile technique was utilized including caps, mask, sterile gowns, sterile gloves, sterile drape, hand hygiene and skin antiseptic. A small incision was made with #11 blade scalpel. A 21 gauge needle directed into the right internal vein with ultrasound guidance. A micropuncture dilator set was placed but the wire would not advance centrally. Although the right internal jugular vein was patent, it was small. Eventually, this access site was abandoned. Ultrasound confirmed a patent left internal jugular vein. Ultrasound image was saved for documentation. Left side of the neck was prepped and draped in sterile fashion. Maximal barrier sterile technique was utilized including caps, mask, sterile gowns,  sterile gloves, sterile drape, hand hygiene and skin antiseptic. Skin was anesthetized with 1% lidocaine. A small incision was made. Using ultrasound guidance, 21 gauge needle was directed into the left internal jugular vein. Wire was advanced centrally. Micropuncture dilator set was placed. A J wire was advanced into the SVC. Tract was dilated. A 24 cm Mahurkar catheter was selected. The catheter was advanced over a wire and positioned at the superior cavoatrial junction. Fluoroscopic images were obtained for documentation. Both dialysis lumens were found to aspirate and flush well. The proper amount of heparin was flushed in both lumens. The central venous lumen was flushed with normal saline. Catheter was sutured to skin. FINDINGS: Catheter tip at the superior cavoatrial junction. IMPRESSION: Successful placement of a left jugular non-tunneled dialysis catheter using ultrasound and fluoroscopic guidance. Electronically Signed   By: Markus Daft M.D.   On: 07/17/2020 08:02    ASSESSMENT AND PLAN: 1. TTP diagnosed in 2005, treated with plasma exchange, steroids, and rituximab consolidation ? 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 Relapse of TTP March 2015-plasma exchange, steroids, rituximab, and maintenance azathioprine ? Admission with severe thrombocytopenia and elevated LDH 07/16/2020,ADAMTS13 activity less than 2%, consistent with relapse of TTP  -Steroids/FFP given 07/16/2020  -Daily plasma exchange beginning 07/17/2020 2. History of epidural abscess requiring laminectomy 2012 3. 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  COPD  Ms. Boehlke appears improved.  She is scheduled to undergo her fourth plasmapheresis today.  Plan is for continuation of daily plasmapheresis through at least 3/22.  Thereafter, we will consider weaning her plasmapheresis down to Monday, Wednesday, Friday.  We will  also consider her for outpatient rituximab.  We will follow up on ADAMTS 13 when results are available.  Will discontinue Solu-Medrol after today's dose and change to prednisone 60 mg daily starting 3/22.  Recommendations: 1.  Continue daily plasmapheresis. 2.  Discontinue Solu-Medrol after today's dose and change to prednisone 60 mg daily starting 3/22. 3.  We will follow up on ADAMTS 13 when results are available. 4.  We will consider her for outpatient rituximab.   LOS: 4 days   Mikey Bussing, DNP, AGPCNP-BC, AOCNP 07/20/20 Ms.Winiarski interviewed and examined.  She has completed 3 daily plasmapheresis procedures and she is maintained on Solu-Medrol.  Her clinical status has improved.  The markers of hemolysis and platelet count are improved.  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  The plan is to continue daily plasmapheresis until the platelet count has normalized.  She will then be changed to an every other day regimen as an outpatient.  We will plan for rituximab as an outpatient.  Solu-Medrol will be changed to oral dosing beginning tomorrow.  I was present for greater than 50% of today's visit.  I performed medical decision making.

## 2020-07-20 NOTE — Plan of Care (Signed)

## 2020-07-20 NOTE — Care Management Important Message (Signed)
Important Message  Patient Details  Name: [name] MRN: [id number] Date of Birth: May 18, 1940   Medicare Important Message Given:  Yes     Shelda Altes 07/20/2020, 10:53 AM

## 2020-07-21 DIAGNOSIS — M3119 Other thrombotic microangiopathy: Secondary | ICD-10-CM | POA: Diagnosis not present

## 2020-07-21 LAB — THERAPEUTIC PLASMA EXCHANGE (BLOOD BANK)
Plasma Exchange: 2402
Plasma volume needed: 2402
Unit division: 0
Unit division: 0
Unit division: 0

## 2020-07-21 LAB — CBC WITH DIFFERENTIAL/PLATELET
Abs Immature Granulocytes: 0.25 10*3/uL — ABNORMAL HIGH (ref 0.00–0.07)
Basophils Absolute: 0 10*3/uL (ref 0.0–0.1)
Basophils Relative: 0 %
Eosinophils Absolute: 0 10*3/uL (ref 0.0–0.5)
Eosinophils Relative: 0 %
HCT: 33.4 % — ABNORMAL LOW (ref 36.0–46.0)
Hemoglobin: 11.2 g/dL — ABNORMAL LOW (ref 12.0–15.0)
Immature Granulocytes: 3 %
Lymphocytes Relative: 33 %
Lymphs Abs: 3.1 10*3/uL (ref 0.7–4.0)
MCH: 32.1 pg (ref 26.0–34.0)
MCHC: 33.5 g/dL (ref 30.0–36.0)
MCV: 95.7 fL (ref 80.0–100.0)
Monocytes Absolute: 0.6 10*3/uL (ref 0.1–1.0)
Monocytes Relative: 6 %
Neutro Abs: 5.4 10*3/uL (ref 1.7–7.7)
Neutrophils Relative %: 58 %
Platelets: 98 10*3/uL — ABNORMAL LOW (ref 150–400)
RBC: 3.49 MIL/uL — ABNORMAL LOW (ref 3.87–5.11)
RDW: 13.9 % (ref 11.5–15.5)
WBC: 9.3 10*3/uL (ref 4.0–10.5)
nRBC: 0 % (ref 0.0–0.2)

## 2020-07-21 LAB — COMPREHENSIVE METABOLIC PANEL
ALT: 72 U/L — ABNORMAL HIGH (ref 0–44)
AST: 57 U/L — ABNORMAL HIGH (ref 15–41)
Albumin: 2.6 g/dL — ABNORMAL LOW (ref 3.5–5.0)
Alkaline Phosphatase: 43 U/L (ref 38–126)
Anion gap: 5 (ref 5–15)
BUN: 28 mg/dL — ABNORMAL HIGH (ref 8–23)
CO2: 33 mmol/L — ABNORMAL HIGH (ref 22–32)
Calcium: 8.9 mg/dL (ref 8.9–10.3)
Chloride: 102 mmol/L (ref 98–111)
Creatinine, Ser: 1.13 mg/dL — ABNORMAL HIGH (ref 0.44–1.00)
GFR, Estimated: 50 mL/min — ABNORMAL LOW (ref >60)
Glucose, Bld: 105 mg/dL — ABNORMAL HIGH (ref 70–99)
Potassium: 4.1 mmol/L (ref 3.5–5.1)
Sodium: 140 mmol/L (ref 135–145)
Total Bilirubin: 0.7 mg/dL (ref 0.3–1.2)
Total Protein: 4.4 g/dL — ABNORMAL LOW (ref 6.5–8.1)

## 2020-07-21 LAB — LACTATE DEHYDROGENASE: LDH: 176 U/L (ref 98–192)

## 2020-07-21 LAB — ADAMTS13 ANTIBODY: ADAMTS13 Antibody: 16 Units/mL — ABNORMAL HIGH (ref <12)

## 2020-07-21 LAB — ADAMTS13 ACTIVITY: Adamts 13 Activity: <2 % — CL (ref >66.8)

## 2020-07-21 MED ORDER — CALCIUM GLUCONATE-NACL 2-0.675 GM/100ML-% IV SOLN: 2.0000 g | Freq: Once | INTRAVENOUS | Status: AC

## 2020-07-21 MED ORDER — CALCIUM CARBONATE ANTACID 500 MG PO CHEW
2.0000 | CHEWABLE_TABLET | ORAL | Status: AC
  Administered 2020-07-21 (×2): 400 mg via ORAL
  Filled 2020-07-21: qty 2

## 2020-07-21 MED ORDER — ACETAMINOPHEN 325 MG PO TABS
ORAL_TABLET | ORAL | Status: AC
  Filled 2020-07-21: qty 2

## 2020-07-21 MED ORDER — ACETAMINOPHEN 325 MG PO TABS
650.0000 mg | ORAL_TABLET | ORAL | Status: DC | PRN
  Administered 2020-07-21: 650 mg via ORAL

## 2020-07-21 MED ORDER — CALCIUM GLUCONATE-NACL 2-0.675 GM/100ML-% IV SOLN
INTRAVENOUS | Status: AC
  Administered 2020-07-21: 2000 mg via INTRAVENOUS
  Filled 2020-07-21: qty 100

## 2020-07-21 MED ORDER — ACD FORMULA A 0.73-2.45-2.2 GM/100ML VI SOLN: 1000.0000 mL | Status: DC

## 2020-07-21 MED ORDER — CALCIUM CARBONATE ANTACID 500 MG PO CHEW
CHEWABLE_TABLET | ORAL | Status: AC
  Filled 2020-07-21: qty 2

## 2020-07-21 MED ORDER — ACD FORMULA A 0.73-2.45-2.2 GM/100ML VI SOLN
Status: AC
  Administered 2020-07-21: 1000 mL
  Filled 2020-07-21: qty 500

## 2020-07-21 MED ORDER — DIPHENHYDRAMINE HCL 25 MG PO CAPS
ORAL_CAPSULE | ORAL | Status: AC
  Filled 2020-07-21: qty 1

## 2020-07-21 MED ORDER — PREDNISONE 20 MG PO TABS: 60.0000 mg | ORAL_TABLET | Freq: Every day | ORAL | 0 refills | Status: AC

## 2020-07-21 MED ORDER — DIPHENHYDRAMINE HCL 25 MG PO CAPS
25.0000 mg | ORAL_CAPSULE | Freq: Four times a day (QID) | ORAL | Status: DC | PRN
  Administered 2020-07-21: 25 mg via ORAL

## 2020-07-21 MED ORDER — ANTICOAGULANT SODIUM CITRATE 4% (200MG/5ML) IV SOLN
5.0000 mL | Status: DC
  Filled 2020-07-21: qty 5

## 2020-07-21 NOTE — Discharge Summary (Signed)
Physician Discharge Summary  [name] UXN:[id number] DOB: 07-24-1940 DOA: 07/16/2020  PCP: Kirk Ruths, MD  Admit date: 07/16/2020 Discharge date: 07/21/2020  Admitted From: Home Disposition:  Home  Discharge Condition:Stable CODE STATUS:FULL Diet recommendation: Heart Healthy    Brief/Interim Summary: Patient is a 80 y.o.femalewith medical history significant ofCOPD; chronic back pain; h/o epidural abscess, enterococcal sepsis and endocarditis (2012); and recurrent TTP presented with n/v and ataxia.Her daughter reported that she has had 5 prior bouts of TTP. She developed fine petechiae on her abdomen; headache; ataxia (listing to the left); and diffuse large joint pains. She did have some mild LE bruising. They suspected TTP recurrence and so went to her hematologist Dr Learta Codding.  She was here with  direct admission for emergent plasmapheresis and had several sessions .  Platelets level improving.  She is medically stable for discharge today with plan for outpatient plasmapheresis from tomorrow.  Following problems were addressed during her  Hospitalization:  Recurrent TTP -First diagnosed in 2005, received plasma exchange, steroids and rituximab in the past -She received FFP x2, Solu-Medrol, plasmapheresisdailystarted on 3/18 -Platelet nadir at 9,platelet number improving,today is 95,other parameters improving as well -Recommend outpatient plasmapheresis this week on Wednesday, Thursday, Saturday and then next week on Monday, Wednesday, Friday. -Continue prednisone 60 mg daily. -Plan for  outpatient rituximab in oncology/hematology office next week. -Imuran discontinued  Possible transfusion reactionvs endotoxin Vs TTP symptoms -Symptom resolved quickly after Benadryl, steroid, epi, calcium gluconate  COPD, current smoker Stable, no wheezing, no hypoxia Continue home medications Encouraged smoking cessation  History of epidural abscess,  Enterococcus sepsis, endocarditis in 2012 after laminectomy No sign of infection currently  Chronic back pain Appears at baseline, continue Ultram  Constipation;treated with bowel regimen  Mood disorder Continue Prozac and Seroquel    Discharge Diagnoses:  Principal Problem:   TTP (thrombotic thrombocytopenic purpura) Active Problems:   COPD (chronic obstructive pulmonary disease) (HCC)   Tobacco abuse   Chronic pain disorder   Mood disorder [facility])    Discharge Instructions  Discharge Instructions    Diet - low sodium heart healthy   Complete by: As directed    Discharge instructions   Complete by: As directed    1)Please follow up tomorrow for plasmapheresis. We are recommending  plasmapheresis this week on Wednesday, Thursday, Saturday and then next week on Monday, Wednesday, Friday. 2)  Continue prednisone 60 mg daily. 3)  Your hematologist will arrange for outpatient rituximab in the office next week. 4)Follow up with your PCP in 2 weeks   Increase activity slowly   Complete by: As directed      Allergies as of 07/21/2020      Reactions   Adhesive [tape] Other (See Comments)   Blisters, Band-Aids brand rips off the skin- paper tape only!!   Cefuroxime Axetil Anxiety, Other (See Comments)   Made the patient jittery   Other Hives, Other (See Comments)   Wool: Reaction is hives [employer] tape: Reaction is blistering   Sulfa Antibiotics Hives   Sulfa Drugs Cross Reactors Hives      Medication List    STOP taking these medications   azaTHIOprine 50 MG tablet Commonly known as: IMURAN     TAKE these medications   ascorbic acid 500 MG tablet Commonly known as: VITAMIN C Take 500 mg by mouth daily.   cholecalciferol 10 MCG (400 UNIT) Tabs tablet Commonly known as: VITAMIN D3 Take 400 Units by mouth daily.   cyanocobalamin  1000 MCG tablet Take 1,000 mcg by mouth daily.   FLUoxetine 20 MG capsule Commonly known as: PROZAC Take 20  mg by mouth daily.   folic acid 1 MG tablet Commonly known as: FOLVITE Take 1 mg by mouth daily.   gabapentin 600 MG tablet Commonly known as: NEURONTIN Take 600 mg by mouth 3 (three) times daily.   multivitamin tablet Take 1 tablet by mouth daily.   NICODERM CQ TD Place 1 patch onto the skin See admin instructions. Apply 1 patch onto the skin daily- remove as directed- ONLY WHEN HOSPITALIZED   polyethylene glycol 17 g packet Commonly known as: MIRALAX / GLYCOLAX Take 17 g by mouth daily. What changed:   when to take this  reasons to take this   predniSONE 20 MG tablet Commonly known as: DELTASONE Take 3 tablets (60 mg total) by mouth daily with breakfast for 14 days. Start taking on: July 22, 2020   QUEtiapine 25 MG tablet Commonly known as: SEROQUEL Take 25 mg by mouth at bedtime.   rosuvastatin 20 MG tablet Commonly known as: CRESTOR Take 20 mg by mouth at bedtime.   tiotropium 18 MCG inhalation capsule Commonly known as: SPIRIVA Place 18 mcg into inhaler and inhale daily.   traMADol 50 MG tablet Commonly known as: ULTRAM Take 1 tablet (50 mg total) by mouth every 6 (six) hours as needed. What changed: when to take this       Follow-up Information    Kirk Ruths, MD. Schedule an appointment as soon as possible for a visit in 1 week(s).   Specialty: Internal Medicine Contact information: [place] [address] [phone]              Allergies  Allergen Reactions  . Adhesive [Tape] Other (See Comments)    Blisters, Band-Aids brand rips off the skin- paper tape only!!  . Cefuroxime Axetil Anxiety and Other (See Comments)    Made the patient jittery  . Other Hives and Other (See Comments)    Wool: Reaction is hives [employer] tape: Reaction is blistering   . Sulfa Antibiotics Hives  . Sulfa Drugs Cross Reactors Hives     Consultations:  Hematology   Procedures/Studies: IR US Guide Vasc Access Left  Result Date: 07/17/2020 INDICATION: 80 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 EXAM: FLUOROSCOPIC AND ULTRASOUND GUIDED PLACEMENT OF A NON-TUNNELED DIALYSIS CATHETER Physician: Stephan Minister. Henn, MD MEDICATIONS: None ANESTHESIA/SEDATION: None FLUOROSCOPY TIME:  Fluoroscopy Time: 2 minutes, 36 seconds, 6 mGy COMPLICATIONS: None immediate. PROCEDURE: The procedure was explained to the patient. The risks and benefits of the procedure were discussed and the patient's questions were addressed. Informed consent was obtained from the patient. The patient was placed supine on the interventional table. Ultrasound confirmed a patent right internal jugular vein. The right neck was prepped and draped in a sterile fashion. The right neck was anesthetized with 1% lidocaine. Maximal barrier sterile technique was utilized including caps, mask, sterile gowns, sterile gloves, sterile drape, hand hygiene and skin antiseptic. A small incision was made with #11 blade scalpel. A 21 gauge needle directed into the right internal vein with ultrasound guidance. A micropuncture dilator set was placed but the wire would not advance centrally. Although the right internal jugular vein was patent, it was small. Eventually, this access site was abandoned. Ultrasound confirmed a patent left internal jugular vein. Ultrasound image was saved for documentation. Left  side of the neck was prepped and draped in sterile fashion. Maximal barrier sterile technique was utilized including caps, mask, sterile gowns, sterile gloves, sterile drape, hand hygiene and skin antiseptic. Skin was anesthetized with 1% lidocaine. A small incision was made. Using ultrasound guidance, 21 gauge needle was directed into the left internal jugular vein. Wire was advanced centrally. Micropuncture dilator set was placed. A J wire was advanced into the SVC. Tract  was dilated. A 24 cm Mahurkar catheter was selected. The catheter was advanced over a wire and positioned at the superior cavoatrial junction. Fluoroscopic images were obtained for documentation. Both dialysis lumens were found to aspirate and flush well. The proper amount of heparin was flushed in both lumens. The central venous lumen was flushed with normal saline. Catheter was sutured to skin. FINDINGS: Catheter tip at the superior cavoatrial junction. IMPRESSION: Successful placement of a left jugular non-tunneled dialysis catheter using ultrasound and fluoroscopic guidance. Electronically Signed   By: Markus Daft M.D.   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 EXAM: FLUOROSCOPIC AND ULTRASOUND GUIDED PLACEMENT OF A NON-TUNNELED DIALYSIS CATHETER Physician: Stephan Minister. Henn, MD MEDICATIONS: None ANESTHESIA/SEDATION: None FLUOROSCOPY TIME:  Fluoroscopy Time: 2 minutes, 36 seconds, 6 mGy COMPLICATIONS: None immediate. PROCEDURE: The procedure was explained to the patient. The risks and benefits of the procedure were discussed and the patient's questions were addressed. Informed consent was obtained from the patient. The patient was placed supine on the interventional table. Ultrasound confirmed a patent right internal jugular vein. The right neck was prepped and draped in a sterile fashion. The right neck was anesthetized with 1% lidocaine. Maximal barrier sterile technique was utilized including caps, mask, sterile gowns, sterile gloves, sterile drape, hand hygiene and skin antiseptic. A small incision was made with #11 blade scalpel. A 21 gauge needle directed into the right internal vein with ultrasound guidance. A micropuncture dilator set was placed but the wire would not advance centrally. Although the right internal jugular vein was patent, it was small. Eventually, this  access site was abandoned. Ultrasound confirmed a patent left internal jugular vein. Ultrasound image was saved for documentation. Left side of the neck was prepped and draped in sterile fashion. Maximal barrier sterile technique was utilized including caps, mask, sterile gowns, sterile gloves, sterile drape, hand hygiene and skin antiseptic. Skin was anesthetized with 1% lidocaine. A small incision was made. Using ultrasound guidance, 21 gauge needle was directed into the left internal jugular vein. Wire was advanced centrally. Micropuncture dilator set was placed. A J wire was advanced into the SVC. Tract was dilated. A 24 cm Mahurkar catheter was selected. The catheter was advanced over a wire and positioned at the superior cavoatrial junction. Fluoroscopic images were obtained for documentation. Both dialysis lumens were found to aspirate and flush well. The proper amount of heparin was flushed in both lumens. The central venous lumen was flushed with normal saline. Catheter was sutured to skin. FINDINGS: Catheter tip at the superior cavoatrial junction. IMPRESSION: Successful placement of a left jugular non-tunneled dialysis catheter using ultrasound and fluoroscopic guidance. Electronically Signed   By: Markus Daft M.D.   On: 07/17/2020 08:02      Subjective: Patient seen and examined the bedside this morning.  Hemodynamically stable for discharge today after plasmapheresis.  Discharge Exam: Vitals:   07/21/20 1119 07/21/20 1200  BP: (!) 138/56 (!) 143/50  Pulse: 63 62  Resp: (!) 21 18  Temp:  98.1 F (36.7 C)  SpO2: 91% 93%   Vitals:   07/21/20 1044 07/21/20 1103 07/21/20 1119 07/21/20 1200  BP: (!) 140/53 (!) 134/48 (!) 138/56 (!) 143/50  Pulse: 61 61 63 62  Resp: (!) 24 (!) 22 (!) 21 18  Temp:    98.1 F (36.7 C)  TempSrc:    Oral  SpO2: 91% 90% 91% 93%  Weight:        General: Pt is alert, awake, not in acute distress Cardiovascular: RRR, S1/S2 +, no rubs, no  gallops Respiratory: CTA bilaterally, no wheezing, no rhonchi Abdominal: Soft, NT, ND, bowel sounds + Extremities: no edema, no cyanosis    The results of significant diagnostics from this hospitalization (including imaging, microbiology, ancillary and laboratory) are listed below for reference.     Microbiology: Recent Results (from the past 240 hour(s))  SARS CORONAVIRUS 2 (TAT 6-24 HRS) Nasopharyngeal Nasopharyngeal Swab     Status: None   Collection Time: 07/16/20  7:26 PM   Specimen: Nasopharyngeal Swab  Result Value Ref Range Status   SARS Coronavirus 2 NEGATIVE NEGATIVE Final    Comment: (NOTE) SARS-CoV-2 target nucleic acids are NOT DETECTED.  The SARS-CoV-2 RNA is generally detectable in upper and lower respiratory specimens during the acute phase of infection. Negative results do not preclude SARS-CoV-2 infection, do not rule out co-infections with other pathogens, and should not be used as the sole basis for treatment or other patient management decisions. Negative results must be combined with clinical observations, patient history, and epidemiological information. The expected result is Negative.  Fact Sheet for Patients: SugarRoll.be  Fact Sheet for Healthcare Providers: https://www.woods-mathews.com/  This test is not yet approved or cleared by the [place] FDA and  has been authorized for detection and/or diagnosis of SARS-CoV-2 by FDA under an Emergency Use Authorization (EUA). This EUA will remain  in effect (meaning this test can be used) for the duration of the COVID-19 declaration under Se ction 564(b)(1) of the Act, 21 U.S.C. section 360bbb-3(b)(1), unless the authorization is terminated or revoked sooner.  Performed at [facility], [place] [address] [place], [place] [address]   Gram stain     Status: None   Collection Time: 07/18/20 11:11 AM   Specimen: Fluid  Result Value Ref Range Status    Specimen Description FLUID  Final   Special Requests PLASMA UNIT [id number]  Final   Gram Stain   Final    NO WBC SEEN NO ORGANISMS SEEN Performed at [facility], [place] [address] [place], [place] [address]    Report Status 07/18/2020 FINAL  Final  Culture, body fluid w Gram Stain-bottle     Status: None (Preliminary result)   Collection Time: 07/18/20 11:11 AM   Specimen: Fluid  Result Value Ref Range Status   Specimen Description FLUID  Final   Special Requests PLASMA UNIT [id number]  Final   Culture   Final    NO GROWTH 3 DAYS Performed at [facility], [place] [address] [place], [place] [address]    Report Status PENDING  Incomplete  Culture, blood (routine x 2)     Status: None (Preliminary result)   Collection Time: 07/18/20 12:56 PM   Specimen: BLOOD  Result Value Ref Range Status   Specimen Description BLOOD RIGHT ANTECUBITAL  Final   Special Requests   Final    BOTTLES DRAWN AEROBIC AND ANAEROBIC Blood  Culture adequate volume   Culture   Final    NO GROWTH 3 DAYS Performed at [facility], [place] [address] [place], [place] [address]    Report Status PENDING  Incomplete  Culture, blood (routine x 2)     Status: None (Preliminary result)   Collection Time: 07/18/20  1:02 PM   Specimen: BLOOD RIGHT HAND  Result Value Ref Range Status   Specimen Description BLOOD RIGHT HAND  Final   Special Requests   Final    BOTTLES DRAWN AEROBIC AND ANAEROBIC Blood Culture adequate volume   Culture   Final    NO GROWTH 3 DAYS Performed at [facility], [place] [address] [place], [place] [address]    Report Status PENDING  Incomplete     Labs: BNP (last 3 results) No results for input(s): BNP in the last 8760 hours. Basic Metabolic Panel: Recent Labs  Lab 07/17/20 0100 07/18/20 0007 07/19/20 0540 07/20/20 0423 07/21/20 0426  NA 134* 138 139 139 140  K 4.9 6.0* 4.6 4.1 4.1  CL 99 102 99 101 102  CO2 28 29 33* 34* 33*  GLUCOSE 204* 143* 100* 88  105*  BUN 27* 32* 27* 31* 28*  CREATININE 1.54* 1.37* 1.22* 1.27* 1.13*  CALCIUM 8.9 9.2 9.5 9.1 8.9   Liver Function Tests: Recent Labs  Lab 07/17/20 0100 07/18/20 0007 07/19/20 0540 07/20/20 0423 07/21/20 0426  AST 65* 107* 58* 63* 57*  ALT 42 45* 52* 61* 72*  ALKPHOS 48 62 40 55 43  BILITOT 1.4* 1.6* 0.8 0.6 0.7  PROT 6.2* 5.3* 5.2* 4.7* 4.4*  ALBUMIN 3.4* 3.1* 2.8* 2.7* 2.6*   No results for input(s): LIPASE, AMYLASE in the last 168 hours. No results for input(s): AMMONIA in the last 168 hours. CBC: Recent Labs  Lab 07/17/20 0100 07/18/20 0007 07/19/20 0540 07/20/20 0423 07/21/20 0426  WBC 2.7* 3.8* 5.9 7.1 9.3  NEUTROABS 2.3 3.0 4.8 4.7 5.4  HGB 13.8 13.1 11.9* 11.3* 11.2*  HCT 41.0 38.3 35.6* 33.9* 33.4*  MCV 96.0 94.6 95.7 96.3 95.7  PLT 9* 20* 40* 65* 98*   Cardiac Enzymes: No results for input(s): CKTOTAL, CKMB, CKMBINDEX, TROPONINI in the last 168 hours. BNP: Invalid input(s): POCBNP CBG: No results for input(s): GLUCAP in the last 168 hours. D-Dimer No results for input(s): DDIMER in the last 72 hours. Hgb A1c No results for input(s): HGBA1C in the last 72 hours. Lipid Profile No results for input(s): CHOL, HDL, LDLCALC, TRIG, CHOLHDL, LDLDIRECT in the last 72 hours. Thyroid function studies No results for input(s): TSH, T4TOTAL, T3FREE, THYROIDAB in the last 72 hours.  Invalid input(s): FREET3 Anemia work up No results for input(s): VITAMINB12, FOLATE, FERRITIN, TIBC, IRON, RETICCTPCT in the last 72 hours. Urinalysis    Component Value Date/Time   COLORURINE YELLOW 07/19/2020 0542   APPEARANCEUR CLEAR 07/19/2020 0542   LABSPEC 1.015 07/19/2020 0542   PHURINE 6.0 07/19/2020 0542   GLUCOSEU NEGATIVE 07/19/2020 0542   HGBUR NEGATIVE 07/19/2020 0542   BILIRUBINUR NEGATIVE 07/19/2020 0542   KETONESUR NEGATIVE 07/19/2020 0542   PROTEINUR NEGATIVE 07/19/2020 0542   UROBILINOGEN 1.0 08/19/2013 1225   NITRITE NEGATIVE 07/19/2020 0542    LEUKOCYTESUR NEGATIVE 07/19/2020 0542   Sepsis Labs Invalid input(s): PROCALCITONIN,  WBC,  LACTICIDVEN Microbiology Recent Results (from the past 240 hour(s))  SARS CORONAVIRUS 2 (TAT 6-24 HRS) Nasopharyngeal Nasopharyngeal Swab     Status: None   Collection Time: 07/16/20  7:26 PM  Specimen: Nasopharyngeal Swab  Result Value Ref Range Status   SARS Coronavirus 2 NEGATIVE NEGATIVE Final    Comment: (NOTE) SARS-CoV-2 target nucleic acids are NOT DETECTED.  The SARS-CoV-2 RNA is generally detectable in upper and lower respiratory specimens during the acute phase of infection. Negative results do not preclude SARS-CoV-2 infection, do not rule out co-infections with other pathogens, and should not be used as the sole basis for treatment or other patient management decisions. Negative results must be combined with clinical observations, patient history, and epidemiological information. The expected result is Negative.  Fact Sheet for Patients: SugarRoll.be  Fact Sheet for Healthcare Providers: https://www.woods-mathews.com/  This test is not yet approved or cleared by the [place] FDA and  has been authorized for detection and/or diagnosis of SARS-CoV-2 by FDA under an Emergency Use Authorization (EUA). This EUA will remain  in effect (meaning this test can be used) for the duration of the COVID-19 declaration under Se ction 564(b)(1) of the Act, 21 U.S.C. section 360bbb-3(b)(1), unless the authorization is terminated or revoked sooner.  Performed at [facility], [place] [address], [place], [place] [address]   Gram stain     Status: None   Collection Time: 07/18/20 11:11 AM   Specimen: Fluid  Result Value Ref Range Status   Specimen Description FLUID  Final   Special Requests PLASMA UNIT [id number]  Final   Gram Stain   Final    NO WBC SEEN NO ORGANISMS SEEN Performed at [facility], [place] [address]  [place], [place] [address]    Report Status 07/18/2020 FINAL  Final  Culture, body fluid w Gram Stain-bottle     Status: None (Preliminary result)   Collection Time: 07/18/20 11:11 AM   Specimen: Fluid  Result Value Ref Range Status   Specimen Description FLUID  Final   Special Requests PLASMA UNIT [id number]  Final   Culture   Final    NO GROWTH 3 DAYS Performed at [facility], [place] [address] [place], [place] [address]    Report Status PENDING  Incomplete  Culture, blood (routine x 2)     Status: None (Preliminary result)   Collection Time: 07/18/20 12:56 PM   Specimen: BLOOD  Result Value Ref Range Status   Specimen Description BLOOD RIGHT ANTECUBITAL  Final   Special Requests   Final    BOTTLES DRAWN AEROBIC AND ANAEROBIC Blood Culture adequate volume   Culture   Final    NO GROWTH 3 DAYS Performed at [facility], [place] [address], [place], [place] [address]    Report Status PENDING  Incomplete  Culture, blood (routine x 2)     Status: None (Preliminary result)   Collection Time: 07/18/20  1:02 PM   Specimen: BLOOD RIGHT HAND  Result Value Ref Range Status   Specimen Description BLOOD RIGHT HAND  Final   Special Requests   Final    BOTTLES DRAWN AEROBIC AND ANAEROBIC Blood Culture adequate volume   Culture   Final    NO GROWTH 3 DAYS Performed at [facility], [place] [address] [place], [place] [address]    Report Status PENDING  Incomplete    Please note: You were cared for by a hospitalist during your hospital stay. Once you are discharged, your primary care physician will handle any further medical issues. Please note that NO REFILLS for any discharge medications will be authorized once you are discharged, as it is imperative that  you return to your primary care physician (or establish a relationship with a primary care physician if you do not have one) for your post hospital discharge needs so that they can reassess your need for medications and monitor  your lab values.    Time coordinating discharge: 40 minutes  SIGNED:   Shelly Coss, MD  [facility] 07/21/2020, 3:51 PM Pager [phone]  If 7PM-7AM, please contact night-coverage [email or web address] Password [email or web address]

## 2020-07-21 NOTE — Plan of Care (Signed)

## 2020-07-21 NOTE — Progress Notes (Addendum)
Nutrition Follow-up  DOCUMENTATION CODES:   Not applicable  INTERVENTION:   -Continue Ensure Enlive po BID, each supplement provides 350 kcal and 20 grams of protein -Continue MVI with minerals daily  NUTRITION DIAGNOSIS:   Inadequate oral intake related to decreased appetite,acute illness (altered taste) as evidenced by per patient/family report.  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 PMH includes COPD with ongoing tobacco depndence, chronic back pain, endocarditis, recurrent TTP  3/17 IR placed non-tunneled pheresis cath  Reviewed I/O's: +4.5 L x 24 hours and +4.8 L since admission  Spoke with pt in HD suite. She reports her appetite has improved since admission, however, reports that she feels constipated and is not eating as much as she usually does (noted meal completion 50-100%). Noted pt is on colace and last BM was 07/19/20. She typically has a BM one a day at baseline without the use of laxatives or stools softeners ("it's because I'm not active here").  She reports that she does not like the vanilla Ensure supplements, but is open to take it if another flavor is available.   Per pt, she denies any taste changes except for one days PTA, which she attributes to allergy symptoms. She typically has a very good appetite PTA. Per pt, she does not have a set meal schedule and eats "all kinds of regular food" at home. She denies any changes in diet PTA.   Per pt, her UBW is around 178#. She denies weight loss. Pt shares that she is  extremely active and spends most of her time in her garden.   Discussed importance of good meal and supplement intake to promote healing.   Labs reviewed.   NUTRITION - FOCUSED PHYSICAL EXAM:  Flowsheet Row Most Recent Value  Orbital Region No depletion  Upper Arm Region No depletion  Thoracic and Lumbar Region No depletion  Buccal Region No depletion  Temple Region No depletion  Clavicle Bone Region No depletion  Clavicle and Acromion Bone Region No depletion  Scapular Bone Region No depletion  Dorsal Hand No depletion  Patellar Region No depletion  Anterior Thigh Region No depletion  Posterior Calf Region No depletion  Edema (RD Assessment) Mild  Hair Reviewed  Eyes Reviewed  Mouth Reviewed  Skin Reviewed  Nails Reviewed       Diet Order:   Diet Order            Diet regular Room service appropriate? Yes; Fluid consistency: Thin  Diet effective now                 EDUCATION NEEDS:   Not appropriate for education at this time  Skin:  Skin Assessment: Reviewed RN Assessment  Last BM:  3/16  Height:   Ht Readings from Last 1 Encounters:  07/16/20 5\' 4"  (1.626 m)    Weight:   Wt Readings from Last 1 Encounters:  07/21/20 76.7 kg   BMI:  Body mass index is 29.01 kg/m.  Estimated Nutritional Needs:   Kcal:  1800-2000 kcals  Protein:  85-100 g  Fluid:  >/= 1.8 L    Rory Montel W,  RD, LDN, [place] Registered Dietitian II Certified Diabetes Care and Education Specialist Please refer to [facility] for RD and/or RD on-call/weekend/after hours pager

## 2020-07-21 NOTE — Progress Notes (Signed)
D/C instructions given and reviewed. Tele removed. Husband at bedside to transport home.

## 2020-07-21 NOTE — Progress Notes (Addendum)
HEMATOLOGY-ONCOLOGY PROGRESS NOTE  SUBJECTIVE: The patient offers no complaints today.  No bleeding reported.  She just finished plasmapheresis at the time my visit.  She hopes to go home.  PHYSICAL EXAMINATION:  Vitals:   07/21/20 0045 07/21/20 0527  BP: (!) 125/58 (!) 121/49  Pulse: (!) 54 (!) 56  Resp: 18 18  Temp: (!) 97.5 F (36.4 C) 98.2 F (36.8 C)  SpO2: 93% 92%   Filed Weights   07/19/20 1058 07/20/20 0444 07/21/20 0045  Weight: 73 kg 76.2 kg 76.7 kg    Intake/Output from previous day: 03/21 0701 - 03/22 0700 In: 4465.1 [P.O.:240; I.V.:4225.1] Out: -   GENERAL:alert, no distress and comfortable HEENT: No thrush Resp:  Clear to auscultation bilaterally. No respiratory distress. Cardio: Regular rate and rhythm.  Faint murmur. GI: Abdomen soft and nontender.  No hepatosplenomegaly. Vascular: No leg edema. Neuro:  Alert and oriented x3 Skin: Petechiae scattered over abdominal wall.  Ecchymoses at the lower leg bilaterally. Pheresis catheter without erythema or bleeding  LABORATORY DATA:  I have reviewed the data as listed CMP Latest Ref Rng & Units 07/21/2020 07/20/2020 07/19/2020  Glucose 70 - 99 mg/dL 105(H) 88 100(H)  BUN 8 - 23 mg/dL 28(H) 31(H) 27(H)  Creatinine 0.44 - 1.00 mg/dL 1.13(H) 1.27(H) 1.22(H)  Sodium 135 - 145 mmol/L 140 139 139  Potassium 3.5 - 5.1 mmol/L 4.1 4.1 4.6  Chloride 98 - 111 mmol/L 102 101 99  CO2 22 - 32 mmol/L 33(H) 34(H) 33(H)  Calcium 8.9 - 10.3 mg/dL 8.9 9.1 9.5  Total Protein 6.5 - 8.1 g/dL 4.4(L) 4.7(L) 5.2(L)  Total Bilirubin 0.3 - 1.2 mg/dL 0.7 0.6 0.8  Alkaline Phos 38 - 126 U/L 43 55 40  AST 15 - 41 U/L 57(H) 63(H) 58(H)  ALT 0 - 44 U/L 72(H) 61(H) 52(H)    Lab Results  Component Value Date   WBC 9.3 07/21/2020   HGB 11.2 (L) 07/21/2020   HCT 33.4 (L) 07/21/2020   MCV 95.7 07/21/2020   PLT 98 (L) 07/21/2020   NEUTROABS PENDING 07/21/2020    IR US Guide Vasc Access Left  Result Date: 07/17/2020 INDICATION: 79  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 EXAM: FLUOROSCOPIC AND ULTRASOUND GUIDED PLACEMENT OF A NON-TUNNELED DIALYSIS CATHETER Physician: Stephan Minister. Henn, MD MEDICATIONS: None ANESTHESIA/SEDATION: None FLUOROSCOPY TIME:  Fluoroscopy Time: 2 minutes, 36 seconds, 6 mGy COMPLICATIONS: None immediate. PROCEDURE: The procedure was explained to the patient. The risks and benefits of the procedure were discussed and the patient's questions were addressed. Informed consent was obtained from the patient. The patient was placed supine on the interventional table. Ultrasound confirmed a patent right internal jugular vein. The right neck was prepped and draped in a sterile fashion. The right neck was anesthetized with 1% lidocaine. Maximal barrier sterile technique was utilized including caps, mask, sterile gowns, sterile gloves, sterile drape, hand hygiene and skin antiseptic. A small incision was made with #11 blade scalpel. A 21 gauge needle directed into the right internal vein with ultrasound guidance. A micropuncture dilator set was placed but the wire would not advance centrally. Although the right internal jugular vein was patent, it was small. Eventually, this access site was abandoned. Ultrasound confirmed a patent left internal jugular vein. Ultrasound image was saved for documentation. Left side of the neck was prepped and draped in sterile fashion. Maximal barrier sterile technique was utilized including caps, mask, sterile gowns, sterile gloves, sterile drape, hand hygiene and skin  antiseptic. Skin was anesthetized with 1% lidocaine. A small incision was made. Using ultrasound guidance, 21 gauge needle was directed into the left internal jugular vein. Wire was advanced centrally. Micropuncture dilator set was placed. A J wire was advanced into the SVC. Tract was dilated. A 24 cm Mahurkar catheter was selected. The catheter was advanced over a wire and positioned at the superior cavoatrial  junction. Fluoroscopic images were obtained for documentation. Both dialysis lumens were found to aspirate and flush well. The proper amount of heparin was flushed in both lumens. The central venous lumen was flushed with normal saline. Catheter was sutured to skin. FINDINGS: Catheter tip at the superior cavoatrial junction. IMPRESSION: Successful placement of a left jugular non-tunneled dialysis catheter using ultrasound and fluoroscopic guidance. Electronically Signed   By: Markus Daft M.D.   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 EXAM: FLUOROSCOPIC AND ULTRASOUND GUIDED PLACEMENT OF A NON-TUNNELED DIALYSIS CATHETER Physician: Stephan Minister. Henn, MD MEDICATIONS: None ANESTHESIA/SEDATION: None FLUOROSCOPY TIME:  Fluoroscopy Time: 2 minutes, 36 seconds, 6 mGy COMPLICATIONS: None immediate. PROCEDURE: The procedure was explained to the patient. The risks and benefits of the procedure were discussed and the patient's questions were addressed. Informed consent was obtained from the patient. The patient was placed supine on the interventional table. Ultrasound confirmed a patent right internal jugular vein. The right neck was prepped and draped in a sterile fashion. The right neck was anesthetized with 1% lidocaine. Maximal barrier sterile technique was utilized including caps, mask, sterile gowns, sterile gloves, sterile drape, hand hygiene and skin antiseptic. A small incision was made with #11 blade scalpel. A 21 gauge needle directed into the right internal vein with ultrasound guidance. A micropuncture dilator set was placed but the wire would not advance centrally. Although the right internal jugular vein was patent, it was small. Eventually, this access site was abandoned. Ultrasound confirmed a patent left internal jugular vein. Ultrasound image was saved for documentation. Left  side of the neck was prepped and draped in sterile fashion. Maximal barrier sterile technique was utilized including caps, mask, sterile gowns, sterile gloves, sterile drape, hand hygiene and skin antiseptic. Skin was anesthetized with 1% lidocaine. A small incision was made. Using ultrasound guidance, 21 gauge needle was directed into the left internal jugular vein. Wire was advanced centrally. Micropuncture dilator set was placed. A J wire was advanced into the SVC. Tract was dilated. A 24 cm Mahurkar catheter was selected. The catheter was advanced over a wire and positioned at the superior cavoatrial junction. Fluoroscopic images were obtained for documentation. Both dialysis lumens were found to aspirate and flush well. The proper amount of heparin was flushed in both lumens. The central venous lumen was flushed with normal saline. Catheter was sutured to skin. FINDINGS: Catheter tip at the superior cavoatrial junction. IMPRESSION: Successful placement of a left jugular non-tunneled dialysis catheter using ultrasound and fluoroscopic guidance. Electronically Signed   By: Markus Daft M.D.   On: 07/17/2020 08:02    ASSESSMENT AND PLAN: 1. TTP diagnosed in 2005, treated with plasma exchange, steroids, and rituximab consolidation ? 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 Relapse of TTP March 2015-plasma exchange, steroids, rituximab, and maintenance azathioprine ? Admission with severe thrombocytopenia and elevated LDH 07/16/2020,[id number] activity less than 2%, consistent with relapse of TTP  -Steroids/FFP given 07/16/2020  -Daily plasma exchange beginning 07/17/2020 2.  History of epidural abscess requiring laminectomy 2012 3. 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 COPD  [name] appears improved.  She received her fifth plasmapheresis today.  Recommend plasmapheresis this week on  Wednesday, Thursday, and Saturday and then begin Monday, Wednesday, Friday next week.  We are working on scheduling her for outpatient rituximab next week.  Continue prednisone 60 mg daily.  Recommendations: 1.  Recommend plasmapheresis this week on Wednesday, Thursday, Saturday and then next week on Monday, Wednesday, Friday. 2.  Continue prednisone 60 mg daily. 3.  We will arrange for outpatient rituximab in our office next week.   LOS: 5 days   Mikey Bussing, DNP, AGPCNP-BC, AOCNP 07/21/20   [name] was interviewed and examined.  She appears stable.  She is tolerating plasmapheresis well.  The platelet count continues to increase.  Markers of hemolysis have improved.  She will begin a prednisone taper today.  I recommend continuing plasmapheresis.  She can be discharged to home to continue outpatient plasmapheresis starting tomorrow.  Rituximab will be given at the Cancer center on 07/28/2020 or 07/30/2020.  She will be seen for follow-up at the Cancer center on the day of rituximab therapy.  I was present for greater than 50% of today's visit.  I performed medical decision making.

## 2020-07-22 ENCOUNTER — Other Ambulatory Visit: Payer: Self-pay | Admitting: Oncology

## 2020-07-22 ENCOUNTER — Non-Acute Institutional Stay (HOSPITAL_COMMUNITY)
Admission: RE | Admit: 2020-07-22 | Discharge: 2020-07-22 | Disposition: A | Discharge status: Home / Self Care | Payer: Medicare Other | Source: Ambulatory Visit | Attending: Oncology | Admitting: Oncology

## 2020-07-22 DIAGNOSIS — M3119 Other thrombotic microangiopathy: Secondary | ICD-10-CM | POA: Diagnosis present

## 2020-07-22 LAB — BASIC METABOLIC PANEL
Anion gap: 6 (ref 5–15)
BUN: 23 mg/dL (ref 8–23)
CO2: 32 mmol/L (ref 22–32)
Calcium: 9 mg/dL (ref 8.9–10.3)
Chloride: 100 mmol/L (ref 98–111)
Creatinine, Ser: 1.14 mg/dL — ABNORMAL HIGH (ref 0.44–1.00)
GFR, Estimated: 49 mL/min — ABNORMAL LOW (ref >60)
Glucose, Bld: 102 mg/dL — ABNORMAL HIGH (ref 70–99)
Potassium: 3.6 mmol/L (ref 3.5–5.1)
Sodium: 138 mmol/L (ref 135–145)

## 2020-07-22 LAB — CBC
HCT: 35.5 % — ABNORMAL LOW (ref 36.0–46.0)
Hemoglobin: 11.6 g/dL — ABNORMAL LOW (ref 12.0–15.0)
MCH: 32 pg (ref 26.0–34.0)
MCHC: 32.7 g/dL (ref 30.0–36.0)
MCV: 98.1 fL (ref 80.0–100.0)
Platelets: 155 10*3/uL (ref 150–400)
RBC: 3.62 MIL/uL — ABNORMAL LOW (ref 3.87–5.11)
RDW: 14.1 % (ref 11.5–15.5)
WBC: 10.8 10*3/uL — ABNORMAL HIGH (ref 4.0–10.5)
nRBC: 0 % (ref 0.0–0.2)

## 2020-07-22 MED ORDER — ACD FORMULA A 0.73-2.45-2.2 GM/100ML VI SOLN: 1000.0000 mL | Status: DC

## 2020-07-22 MED ORDER — CALCIUM CARBONATE ANTACID 500 MG PO CHEW
CHEWABLE_TABLET | ORAL | Status: AC
  Administered 2020-07-22: 400 mg via ORAL
  Filled 2020-07-22: qty 4

## 2020-07-22 MED ORDER — ACD FORMULA A 0.73-2.45-2.2 GM/100ML VI SOLN
Status: AC
  Administered 2020-07-22: 1000 mL
  Filled 2020-07-22: qty 500

## 2020-07-22 MED ORDER — CALCIUM GLUCONATE-NACL 2-0.675 GM/100ML-% IV SOLN: 2.0000 g | Freq: Once | INTRAVENOUS | Status: AC

## 2020-07-22 MED ORDER — DIPHENHYDRAMINE HCL 25 MG PO CAPS
ORAL_CAPSULE | ORAL | Status: AC
  Administered 2020-07-22: 25 mg via ORAL
  Filled 2020-07-22: qty 1

## 2020-07-22 MED ORDER — CALCIUM CARBONATE ANTACID 500 MG PO CHEW: 2.0000 | CHEWABLE_TABLET | ORAL | Status: DC

## 2020-07-22 MED ORDER — ACETAMINOPHEN 325 MG PO TABS: 650.0000 mg | ORAL_TABLET | ORAL | Status: DC | PRN

## 2020-07-22 MED ORDER — CALCIUM GLUCONATE-NACL 2-0.675 GM/100ML-% IV SOLN
INTRAVENOUS | Status: AC
  Administered 2020-07-22: 2000 mg via INTRAVENOUS
  Filled 2020-07-22: qty 100

## 2020-07-22 MED ORDER — DIPHENHYDRAMINE HCL 25 MG PO CAPS: 25.0000 mg | ORAL_CAPSULE | Freq: Four times a day (QID) | ORAL | Status: DC | PRN

## 2020-07-22 MED ORDER — ANTICOAGULANT SODIUM CITRATE 4% (200MG/5ML) IV SOLN
5.0000 mL | Freq: Once | Status: DC
  Filled 2020-07-22: qty 5

## 2020-07-22 MED ORDER — ACETAMINOPHEN 325 MG PO TABS
ORAL_TABLET | ORAL | Status: AC
  Administered 2020-07-22: 650 mg via ORAL
  Filled 2020-07-22: qty 2

## 2020-07-22 NOTE — Progress Notes (Signed)
Pt was accompanied to the main entrance on a wheel chair by Dara Hoyer, Secretary. Pt denies pain, n/v, dizziness, paresthesia. Pt was made aware that we can only order the plasma when she is admitted. Hence it will take sometime for the blood bank to thaw the plasma after the order is placed. Advised she she has to be prepared to wait for the plasma to be ready.

## 2020-07-23 ENCOUNTER — Encounter: Payer: Self-pay | Admitting: *Deleted

## 2020-07-23 ENCOUNTER — Non-Acute Institutional Stay (HOSPITAL_COMMUNITY)
Admission: RE | Admit: 2020-07-23 | Discharge: 2020-07-23 | Disposition: A | Discharge status: Home / Self Care | Payer: Medicare Other | Source: Ambulatory Visit | Attending: Oncology | Admitting: Oncology

## 2020-07-23 DIAGNOSIS — M3119 Other thrombotic microangiopathy: Secondary | ICD-10-CM | POA: Diagnosis not present

## 2020-07-23 LAB — THERAPEUTIC PLASMA EXCHANGE (BLOOD BANK)
Plasma Exchange: 2600
Plasma Exchange: 2402
Plasma volume needed: 2600
Plasma volume needed: 2402
Unit division: 0
Unit division: 0
Unit division: 0
Unit division: 0
Unit division: 0
Unit division: 0
Unit division: 0
Unit division: 0
Unit division: 0
Unit division: 0

## 2020-07-23 LAB — BASIC METABOLIC PANEL
Anion gap: 6 (ref 5–15)
BUN: 21 mg/dL (ref 8–23)
CO2: 31 mmol/L (ref 22–32)
Calcium: 8.6 mg/dL — ABNORMAL LOW (ref 8.9–10.3)
Chloride: 102 mmol/L (ref 98–111)
Creatinine, Ser: 1.19 mg/dL — ABNORMAL HIGH (ref 0.44–1.00)
GFR, Estimated: 47 mL/min — ABNORMAL LOW (ref >60)
Glucose, Bld: 98 mg/dL (ref 70–99)
Potassium: 4.1 mmol/L (ref 3.5–5.1)
Sodium: 139 mmol/L (ref 135–145)

## 2020-07-23 LAB — CULTURE, BLOOD (ROUTINE X 2)
Culture: NO GROWTH
Culture: NO GROWTH
Special Requests: ADEQUATE
Special Requests: ADEQUATE

## 2020-07-23 LAB — HEPATITIS B CORE ANTIBODY, TOTAL: Hep B Core Total Ab: NONREACTIVE

## 2020-07-23 LAB — CULTURE, BODY FLUID W GRAM STAIN -BOTTLE: Culture: NO GROWTH

## 2020-07-23 LAB — HEPATITIS B SURFACE ANTIGEN: Hepatitis B Surface Ag: NONREACTIVE

## 2020-07-23 LAB — HEPATITIS B SURFACE ANTIBODY,QUALITATIVE: Hep B S Ab: REACTIVE — AB

## 2020-07-23 LAB — TRANSFUSION REACTION
DAT C3: NEGATIVE
Post RXN DAT IgG: NEGATIVE

## 2020-07-23 MED ORDER — DIPHENHYDRAMINE HCL 25 MG PO CAPS
ORAL_CAPSULE | ORAL | Status: AC
  Administered 2020-07-23: 25 mg via ORAL
  Filled 2020-07-23: qty 1

## 2020-07-23 MED ORDER — ANTICOAGULANT SODIUM CITRATE 4% (200MG/5ML) IV SOLN
5.0000 mL | Freq: Once | Status: AC
  Administered 2020-07-23: 5 mL
  Filled 2020-07-23: qty 5

## 2020-07-23 MED ORDER — CALCIUM CARBONATE ANTACID 500 MG PO CHEW
2.0000 | CHEWABLE_TABLET | ORAL | Status: AC
  Administered 2020-07-23: 400 mg via ORAL

## 2020-07-23 MED ORDER — CALCIUM CARBONATE ANTACID 500 MG PO CHEW
CHEWABLE_TABLET | ORAL | Status: AC
  Administered 2020-07-23: 400 mg via ORAL
  Filled 2020-07-23: qty 2

## 2020-07-23 MED ORDER — ACETAMINOPHEN 325 MG PO TABS
ORAL_TABLET | ORAL | Status: AC
  Administered 2020-07-23: 650 mg via ORAL
  Filled 2020-07-23: qty 2

## 2020-07-23 MED ORDER — ACD FORMULA A 0.73-2.45-2.2 GM/100ML VI SOLN
Status: AC
  Filled 2020-07-23: qty 500

## 2020-07-23 MED ORDER — ACETAMINOPHEN 325 MG PO TABS: 650.0000 mg | ORAL_TABLET | ORAL | Status: DC | PRN

## 2020-07-23 MED ORDER — ACD FORMULA A 0.73-2.45-2.2 GM/100ML VI SOLN: 1000.0000 mL | Status: DC

## 2020-07-23 MED ORDER — DIPHENHYDRAMINE HCL 25 MG PO CAPS: 25.0000 mg | ORAL_CAPSULE | Freq: Four times a day (QID) | ORAL | Status: DC | PRN

## 2020-07-23 MED ORDER — CALCIUM GLUCONATE-NACL 2-0.675 GM/100ML-% IV SOLN: 2.0000 g | Freq: Once | INTRAVENOUS | Status: AC

## 2020-07-23 MED ORDER — CALCIUM GLUCONATE-NACL 2-0.675 GM/100ML-% IV SOLN
INTRAVENOUS | Status: AC
  Administered 2020-07-23: 2000 mg via INTRAVENOUS
  Filled 2020-07-23: qty 100

## 2020-07-23 NOTE — Progress Notes (Signed)
Daughter, Cherre Robins brought FMLA form in for completion and requests it be completed by 08/01/20 and she will pick it up. Forwarded to [place], Therapist, sports to complete.

## 2020-07-23 NOTE — Progress Notes (Signed)
Therapeutic Plasma Exchange completed w/o issue. Delay in treatment start today due to machine malfunction. Resolved timely due to maintenance in house. Patient departed unit alert, oriented vitals stable w/o complaint via wheelchair by this RN. Next treatment on 07/25/20.

## 2020-07-24 ENCOUNTER — Other Ambulatory Visit: Payer: Self-pay | Admitting: Oncology

## 2020-07-24 ENCOUNTER — Other Ambulatory Visit: Payer: Self-pay | Admitting: *Deleted

## 2020-07-24 LAB — THERAPEUTIC PLASMA EXCHANGE (BLOOD BANK)
Plasma Exchange: 2400
Plasma volume needed: 2400
Unit division: 0
Unit division: 0
Unit division: 0
Unit division: 0
Unit division: 0

## 2020-07-25 ENCOUNTER — Inpatient Hospital Stay (HOSPITAL_COMMUNITY): Admission: AD | Admit: 2020-07-25 | Payer: Medicare Other | Source: Ambulatory Visit | Admitting: Oncology

## 2020-07-25 ENCOUNTER — Non-Acute Institutional Stay (HOSPITAL_COMMUNITY)
Admission: EM | Admit: 2020-07-25 | Discharge: 2020-07-25 | Disposition: A | Discharge status: Home / Self Care | Payer: Medicare Other | Attending: Oncology | Admitting: Oncology

## 2020-07-25 DIAGNOSIS — M3119 Other thrombotic microangiopathy: Secondary | ICD-10-CM | POA: Diagnosis not present

## 2020-07-25 LAB — BASIC METABOLIC PANEL
Anion gap: 7 (ref 5–15)
BUN: 27 mg/dL — ABNORMAL HIGH (ref 8–23)
CO2: 28 mmol/L (ref 22–32)
Calcium: 8.8 mg/dL — ABNORMAL LOW (ref 8.9–10.3)
Chloride: 104 mmol/L (ref 98–111)
Creatinine, Ser: 1.27 mg/dL — ABNORMAL HIGH (ref 0.44–1.00)
GFR, Estimated: 43 mL/min — ABNORMAL LOW (ref >60)
Glucose, Bld: 115 mg/dL — ABNORMAL HIGH (ref 70–99)
Potassium: 4.2 mmol/L (ref 3.5–5.1)
Sodium: 139 mmol/L (ref 135–145)

## 2020-07-25 LAB — LACTATE DEHYDROGENASE: LDH: 237 U/L — ABNORMAL HIGH (ref 98–192)

## 2020-07-25 LAB — FIBRINOGEN: Fibrinogen: 196 mg/dL — ABNORMAL LOW (ref 210–475)

## 2020-07-25 MED ORDER — CALCIUM GLUCONATE-NACL 2-0.675 GM/100ML-% IV SOLN
INTRAVENOUS | Status: AC
  Administered 2020-07-25: 2000 mg
  Filled 2020-07-25: qty 100

## 2020-07-25 MED ORDER — CALCIUM CARBONATE ANTACID 500 MG PO CHEW
2.0000 | CHEWABLE_TABLET | ORAL | Status: DC
  Administered 2020-07-25: 400 mg via ORAL

## 2020-07-25 MED ORDER — CALCIUM GLUCONATE-NACL 2-0.675 GM/100ML-% IV SOLN: 2.0000 g | Freq: Once | INTRAVENOUS | Status: DC

## 2020-07-25 MED ORDER — ACD FORMULA A 0.73-2.45-2.2 GM/100ML VI SOLN: 1000.0000 mL | Status: DC

## 2020-07-25 MED ORDER — DIPHENHYDRAMINE HCL 25 MG PO CAPS
25.0000 mg | ORAL_CAPSULE | Freq: Four times a day (QID) | ORAL | Status: DC | PRN
Start: 2020-07-25 — End: 2020-07-25

## 2020-07-25 MED ORDER — CALCIUM CARBONATE ANTACID 500 MG PO CHEW
CHEWABLE_TABLET | ORAL | Status: AC
  Filled 2020-07-25: qty 1

## 2020-07-25 MED ORDER — ACETAMINOPHEN 325 MG PO TABS
ORAL_TABLET | ORAL | Status: AC
  Filled 2020-07-25: qty 2

## 2020-07-25 MED ORDER — ANTICOAGULANT SODIUM CITRATE 4% (200MG/5ML) IV SOLN
5.0000 mL | Freq: Once | Status: AC
  Administered 2020-07-25: 5 mL
  Filled 2020-07-25: qty 5

## 2020-07-25 MED ORDER — DIPHENHYDRAMINE HCL 25 MG PO CAPS
ORAL_CAPSULE | ORAL | Status: AC
  Administered 2020-07-25: 25 mg
  Filled 2020-07-25: qty 1

## 2020-07-25 MED ORDER — ACD FORMULA A 0.73-2.45-2.2 GM/100ML VI SOLN
Status: AC
  Filled 2020-07-25: qty 500

## 2020-07-25 MED ORDER — ACETAMINOPHEN 325 MG PO TABS
650.0000 mg | ORAL_TABLET | ORAL | Status: DC | PRN
  Administered 2020-07-25: 650 mg via ORAL

## 2020-07-25 NOTE — Progress Notes (Signed)
Treatment dc, tolerated plex with no issues or complaints. vss entire treatment, ldh, fibrinogen and bmet sent to lab, pt to dc home with daughter, escorted by staff in w/c to entrance, stable at dc.

## 2020-07-26 LAB — THERAPEUTIC PLASMA EXCHANGE (BLOOD BANK)
Plasma Exchange: 2522
Plasma volume needed: 2489
Unit division: 0
Unit division: 0
Unit division: 0
Unit division: 0

## 2020-07-27 ENCOUNTER — Telehealth: Payer: Self-pay | Admitting: Oncology

## 2020-07-27 ENCOUNTER — Non-Acute Institutional Stay (HOSPITAL_COMMUNITY)
Admission: RE | Admit: 2020-07-27 | Discharge: 2020-07-27 | Disposition: A | Discharge status: Home / Self Care | Payer: Medicare Other | Source: Ambulatory Visit | Attending: Oncology | Admitting: Oncology

## 2020-07-27 ENCOUNTER — Inpatient Hospital Stay: Payer: Medicare Other

## 2020-07-27 ENCOUNTER — Other Ambulatory Visit: Payer: Self-pay | Admitting: *Deleted

## 2020-07-27 DIAGNOSIS — M3119 Other thrombotic microangiopathy: Secondary | ICD-10-CM | POA: Diagnosis not present

## 2020-07-27 DIAGNOSIS — Z862 Personal history of diseases of the blood and blood-forming organs and certain disorders involving the immune mechanism: Secondary | ICD-10-CM

## 2020-07-27 LAB — CBC
HCT: 37.5 % (ref 36.0–46.0)
Hemoglobin: 12.1 g/dL (ref 12.0–15.0)
MCH: 31.8 pg (ref 26.0–34.0)
MCHC: 32.3 g/dL (ref 30.0–36.0)
MCV: 98.7 fL (ref 80.0–100.0)
Platelets: 231 10*3/uL (ref 150–400)
RBC: 3.8 MIL/uL — ABNORMAL LOW (ref 3.87–5.11)
RDW: 14.8 % (ref 11.5–15.5)
WBC: 12.4 10*3/uL — ABNORMAL HIGH (ref 4.0–10.5)
nRBC: 0 % (ref 0.0–0.2)

## 2020-07-27 LAB — COMPREHENSIVE METABOLIC PANEL
ALT: 93 U/L — ABNORMAL HIGH (ref 0–44)
AST: 55 U/L — ABNORMAL HIGH (ref 15–41)
Albumin: 3 g/dL — ABNORMAL LOW (ref 3.5–5.0)
Alkaline Phosphatase: 53 U/L (ref 38–126)
Anion gap: 8 (ref 5–15)
BUN: 24 mg/dL — ABNORMAL HIGH (ref 8–23)
CO2: 25 mmol/L (ref 22–32)
Calcium: 8.2 mg/dL — ABNORMAL LOW (ref 8.9–10.3)
Chloride: 105 mmol/L (ref 98–111)
Creatinine, Ser: 1.13 mg/dL — ABNORMAL HIGH (ref 0.44–1.00)
GFR, Estimated: 50 mL/min — ABNORMAL LOW (ref >60)
Glucose, Bld: 113 mg/dL — ABNORMAL HIGH (ref 70–99)
Potassium: 4.4 mmol/L (ref 3.5–5.1)
Sodium: 138 mmol/L (ref 135–145)
Total Bilirubin: 0.7 mg/dL (ref 0.3–1.2)
Total Protein: 5.3 g/dL — ABNORMAL LOW (ref 6.5–8.1)

## 2020-07-27 LAB — LACTATE DEHYDROGENASE: LDH: 220 U/L — ABNORMAL HIGH (ref 98–192)

## 2020-07-27 MED ORDER — ACETAMINOPHEN 325 MG PO TABS: 650.0000 mg | ORAL_TABLET | ORAL | Status: DC | PRN

## 2020-07-27 MED ORDER — ACD FORMULA A 0.73-2.45-2.2 GM/100ML VI SOLN: 1000.0000 mL | Status: DC

## 2020-07-27 MED ORDER — DIPHENHYDRAMINE HCL 25 MG PO CAPS
ORAL_CAPSULE | ORAL | Status: AC
  Administered 2020-07-27: 25 mg via ORAL
  Filled 2020-07-27: qty 1

## 2020-07-27 MED ORDER — CALCIUM GLUCONATE-NACL 2-0.675 GM/100ML-% IV SOLN: 2.0000 g | Freq: Once | INTRAVENOUS | Status: DC

## 2020-07-27 MED ORDER — ACD FORMULA A 0.73-2.45-2.2 GM/100ML VI SOLN
Status: AC
  Filled 2020-07-27: qty 500

## 2020-07-27 MED ORDER — DIPHENHYDRAMINE HCL 25 MG PO CAPS: 25.0000 mg | ORAL_CAPSULE | Freq: Four times a day (QID) | ORAL | Status: DC | PRN

## 2020-07-27 MED ORDER — ACETAMINOPHEN 325 MG PO TABS
ORAL_TABLET | ORAL | Status: AC
  Administered 2020-07-27: 650 mg via ORAL
  Filled 2020-07-27: qty 2

## 2020-07-27 MED ORDER — CALCIUM GLUCONATE-NACL 2-0.675 GM/100ML-% IV SOLN
INTRAVENOUS | Status: AC
  Administered 2020-07-27: 2000 mg via INTRAVENOUS
  Filled 2020-07-27: qty 100

## 2020-07-27 MED ORDER — ANTICOAGULANT SODIUM CITRATE 4% (200MG/5ML) IV SOLN
5.0000 mL | Status: DC
  Filled 2020-07-27: qty 5

## 2020-07-27 MED ORDER — CALCIUM CARBONATE ANTACID 500 MG PO CHEW: 2.0000 | CHEWABLE_TABLET | ORAL | Status: DC

## 2020-07-27 MED ORDER — CALCIUM CARBONATE ANTACID 500 MG PO CHEW
CHEWABLE_TABLET | ORAL | Status: AC
  Administered 2020-07-27: 400 mg via ORAL
  Filled 2020-07-27: qty 4

## 2020-07-27 NOTE — Progress Notes (Signed)
Per MD: added fibrinogen to labs on 3/28

## 2020-07-27 NOTE — Telephone Encounter (Signed)
I mailed updated appointment calendar to patient w/ change of location  

## 2020-07-27 NOTE — Progress Notes (Signed)
Pt completed TPE without complications; pt denies pain, n/v, SOB, dizziness or paresthesia. Pt accompanied to the main entrance to her awaiting daughter.

## 2020-07-28 LAB — THERAPEUTIC PLASMA EXCHANGE (BLOOD BANK)
Plasma Exchange: 2489
Plasma volume needed: 2489
Unit division: 0
Unit division: 0
Unit division: 0
Unit division: 0
Unit division: 0

## 2020-07-29 ENCOUNTER — Telehealth: Payer: Self-pay

## 2020-07-29 ENCOUNTER — Non-Acute Institutional Stay (HOSPITAL_COMMUNITY)
Admission: RE | Admit: 2020-07-29 | Discharge: 2020-07-29 | Disposition: A | Discharge status: Home / Self Care | Payer: Medicare Other | Source: Ambulatory Visit | Attending: Oncology | Admitting: Oncology

## 2020-07-29 DIAGNOSIS — M3119 Other thrombotic microangiopathy: Secondary | ICD-10-CM | POA: Diagnosis present

## 2020-07-29 LAB — CBC
HCT: 37.8 % (ref 36.0–46.0)
Hemoglobin: 12.4 g/dL (ref 12.0–15.0)
MCH: 32.5 pg (ref 26.0–34.0)
MCHC: 32.8 g/dL (ref 30.0–36.0)
MCV: 99 fL (ref 80.0–100.0)
Platelets: 210 10*3/uL (ref 150–400)
RBC: 3.82 MIL/uL — ABNORMAL LOW (ref 3.87–5.11)
RDW: 15 % (ref 11.5–15.5)
WBC: 9.7 10*3/uL (ref 4.0–10.5)
nRBC: 0 % (ref 0.0–0.2)

## 2020-07-29 LAB — COMPREHENSIVE METABOLIC PANEL
ALT: 60 U/L — ABNORMAL HIGH (ref 0–44)
AST: 40 U/L (ref 15–41)
Albumin: 3 g/dL — ABNORMAL LOW (ref 3.5–5.0)
Alkaline Phosphatase: 64 U/L (ref 38–126)
Anion gap: 7 (ref 5–15)
BUN: 25 mg/dL — ABNORMAL HIGH (ref 8–23)
CO2: 29 mmol/L (ref 22–32)
Calcium: 8.8 mg/dL — ABNORMAL LOW (ref 8.9–10.3)
Chloride: 103 mmol/L (ref 98–111)
Creatinine, Ser: 1.32 mg/dL — ABNORMAL HIGH (ref 0.44–1.00)
GFR, Estimated: 41 mL/min — ABNORMAL LOW (ref >60)
Glucose, Bld: 111 mg/dL — ABNORMAL HIGH (ref 70–99)
Potassium: 3.6 mmol/L (ref 3.5–5.1)
Sodium: 139 mmol/L (ref 135–145)
Total Bilirubin: 0.5 mg/dL (ref 0.3–1.2)
Total Protein: 5.2 g/dL — ABNORMAL LOW (ref 6.5–8.1)

## 2020-07-29 LAB — LACTATE DEHYDROGENASE: LDH: 218 U/L — ABNORMAL HIGH (ref 98–192)

## 2020-07-29 MED ORDER — DIPHENHYDRAMINE HCL 25 MG PO CAPS
25.0000 mg | ORAL_CAPSULE | Freq: Four times a day (QID) | ORAL | Status: DC | PRN
Start: 2020-07-29 — End: 2020-07-29

## 2020-07-29 MED ORDER — DIPHENHYDRAMINE HCL 25 MG PO CAPS
ORAL_CAPSULE | ORAL | Status: AC
  Administered 2020-07-29: 25 mg via ORAL
  Filled 2020-07-29: qty 1

## 2020-07-29 MED ORDER — ACETAMINOPHEN 325 MG PO TABS: 650.0000 mg | ORAL_TABLET | ORAL | Status: DC | PRN

## 2020-07-29 MED ORDER — ACETAMINOPHEN 325 MG PO TABS
ORAL_TABLET | ORAL | Status: AC
  Administered 2020-07-29: 650 mg via ORAL
  Filled 2020-07-29: qty 2

## 2020-07-29 MED ORDER — ACD FORMULA A 0.73-2.45-2.2 GM/100ML VI SOLN
Status: AC
  Administered 2020-07-29: 1000 mL
  Filled 2020-07-29: qty 500

## 2020-07-29 MED ORDER — ACD FORMULA A 0.73-2.45-2.2 GM/100ML VI SOLN: 1000.0000 mL | Status: DC

## 2020-07-29 MED ORDER — CALCIUM CARBONATE ANTACID 500 MG PO CHEW
CHEWABLE_TABLET | ORAL | Status: AC
  Administered 2020-07-29: 400 mg via ORAL
  Filled 2020-07-29: qty 4

## 2020-07-29 MED ORDER — CALCIUM CARBONATE ANTACID 500 MG PO CHEW: 2.0000 | CHEWABLE_TABLET | ORAL | Status: DC

## 2020-07-29 MED ORDER — CALCIUM GLUCONATE-NACL 2-0.675 GM/100ML-% IV SOLN
INTRAVENOUS | Status: AC
  Administered 2020-07-29: 2000 mg via INTRAVENOUS
  Filled 2020-07-29: qty 100

## 2020-07-29 MED ORDER — CALCIUM GLUCONATE-NACL 2-0.675 GM/100ML-% IV SOLN: 2.0000 g | Freq: Once | INTRAVENOUS | Status: DC

## 2020-07-29 MED ORDER — ANTICOAGULANT SODIUM CITRATE 4% (200MG/5ML) IV SOLN
5.0000 mL | Freq: Once | Status: DC
  Filled 2020-07-29: qty 5

## 2020-07-29 NOTE — Telephone Encounter (Signed)
Spoke with patient's daughter Gwinda Passe regarding infusion scheduled for tomorrow 07/30/2020 at 7:30 am for Rituxan.  She verbalized an understanding. I apologized to her that she did not get a phone call from the scheduler.

## 2020-07-29 NOTE — Progress Notes (Signed)
Tx completed without complications; Pt denies pain, dizziness, SOB, or n/v. Pt concerned about the swelling in the lower extremities, called Dr. Betsy Coder to report the finding; also reported the HD catheter issues resluting in frequent machine stoppages. Dr. Betsy Coder reduced the Prednisone to 40mg  and stated he would assess her feet on 07/30/2020 during the MD visit at his office.

## 2020-07-30 ENCOUNTER — Inpatient Hospital Stay: Payer: Medicare Other

## 2020-07-30 ENCOUNTER — Inpatient Hospital Stay (HOSPITAL_BASED_OUTPATIENT_CLINIC_OR_DEPARTMENT_OTHER): Payer: Medicare Other | Admitting: Oncology

## 2020-07-30 ENCOUNTER — Other Ambulatory Visit: Payer: Self-pay | Admitting: Oncology

## 2020-07-30 ENCOUNTER — Telehealth: Payer: Self-pay

## 2020-07-30 ENCOUNTER — Other Ambulatory Visit: Payer: Self-pay

## 2020-07-30 VITALS — BP 109/65 | HR 80 | Temp 99.2°F | Resp 18

## 2020-07-30 DIAGNOSIS — R519 Headache, unspecified: Secondary | ICD-10-CM | POA: Insufficient documentation

## 2020-07-30 DIAGNOSIS — M2550 Pain in unspecified joint: Secondary | ICD-10-CM | POA: Diagnosis not present

## 2020-07-30 DIAGNOSIS — R4182 Altered mental status, unspecified: Secondary | ICD-10-CM | POA: Insufficient documentation

## 2020-07-30 DIAGNOSIS — R233 Spontaneous ecchymoses: Secondary | ICD-10-CM | POA: Diagnosis not present

## 2020-07-30 DIAGNOSIS — R58 Hemorrhage, not elsewhere classified: Secondary | ICD-10-CM | POA: Insufficient documentation

## 2020-07-30 DIAGNOSIS — Z79899 Other long term (current) drug therapy: Secondary | ICD-10-CM | POA: Diagnosis not present

## 2020-07-30 DIAGNOSIS — M549 Dorsalgia, unspecified: Secondary | ICD-10-CM | POA: Diagnosis not present

## 2020-07-30 DIAGNOSIS — M3119 Other thrombotic microangiopathy: Secondary | ICD-10-CM | POA: Diagnosis not present

## 2020-07-30 DIAGNOSIS — R7402 Elevation of levels of lactic acid dehydrogenase (LDH): Secondary | ICD-10-CM | POA: Insufficient documentation

## 2020-07-30 DIAGNOSIS — J449 Chronic obstructive pulmonary disease, unspecified: Secondary | ICD-10-CM | POA: Diagnosis not present

## 2020-07-30 DIAGNOSIS — Z862 Personal history of diseases of the blood and blood-forming organs and certain disorders involving the immune mechanism: Secondary | ICD-10-CM

## 2020-07-30 DIAGNOSIS — G8929 Other chronic pain: Secondary | ICD-10-CM | POA: Diagnosis not present

## 2020-07-30 DIAGNOSIS — R944 Abnormal results of kidney function studies: Secondary | ICD-10-CM | POA: Diagnosis not present

## 2020-07-30 LAB — THERAPEUTIC PLASMA EXCHANGE (BLOOD BANK)
Plasma volume needed: 2489
Unit division: 0
Unit division: 0
Unit division: 0
Unit division: 0

## 2020-07-30 MED ORDER — DIPHENHYDRAMINE HCL 25 MG PO CAPS
ORAL_CAPSULE | ORAL | Status: AC
  Filled 2020-07-30: qty 2

## 2020-07-30 MED ORDER — ANTICOAGULANT SODIUM CITRATE 4% (200MG/5ML) IV SOLN
5.0000 mL | Freq: Once | Status: AC
  Administered 2020-07-30: 5 mL
  Filled 2020-07-30: qty 5

## 2020-07-30 MED ORDER — DIPHENHYDRAMINE HCL 25 MG PO CAPS
50.0000 mg | ORAL_CAPSULE | Freq: Once | ORAL | Status: AC
  Administered 2020-07-30: 50 mg via ORAL

## 2020-07-30 MED ORDER — ACETAMINOPHEN 325 MG PO TABS
650.0000 mg | ORAL_TABLET | Freq: Once | ORAL | Status: AC
  Administered 2020-07-30: 650 mg via ORAL

## 2020-07-30 MED ORDER — SODIUM CHLORIDE 0.9 % IV SOLN
375.0000 mg/m2 | Freq: Once | INTRAVENOUS | Status: AC
  Administered 2020-07-30: 700 mg via INTRAVENOUS
  Filled 2020-07-30: qty 50

## 2020-07-30 MED ORDER — ACETAMINOPHEN 325 MG PO TABS
ORAL_TABLET | ORAL | Status: AC
  Filled 2020-07-30: qty 2

## 2020-07-30 MED ORDER — SODIUM CHLORIDE 0.9 % IV SOLN
Freq: Once | INTRAVENOUS | Status: AC
  Filled 2020-07-30: qty 250

## 2020-07-30 MED ORDER — SODIUM CHLORIDE 0.9% FLUSH
10.0000 mL | INTRAVENOUS | Status: DC | PRN
  Administered 2020-07-30: 10 mL
  Filled 2020-07-30: qty 10

## 2020-07-30 NOTE — Telephone Encounter (Signed)
TC to [facility], Spoke with Charge Nurse Vonshell to inform her. Dr. Benay Spice would like  Pt to receive plasmapheresis for 4/4,4/6, 4/8 with CBC, Cmet, LPH, with each treatment. As well as Fibrinogen on 4/1. Vonshell  verbalized understanding.

## 2020-07-30 NOTE — Progress Notes (Signed)
  [facility] OFFICE PROGRESS NOTE   Diagnosis: TTP  INTERVAL HISTORY:   [name] was discharged from the hospital on 07/21/2020.  She has continued outpatient plasmapheresis on an every other day schedule.  She reports tolerating the plasmapheresis well.  Her only complaint is foot swelling.  Prednisone was decreased to 40 mg yesterday.  Objective:  Vital signs in last 24 hours:  There were no vitals taken for this visit.    HEENT: No thrush Resp: Scattered bilateral inspiratory/expiratory rhonchi and wheeze, no respiratory distress Cardio: Regular rate and rhythm GI: No hepatosplenomegaly, nontender Vascular: Trace lower leg/pedal pitting edema bilaterally   Left neck a pheresis catheter site without erythema  Lab Results:  Lab Results  Component Value Date   WBC 9.7 07/29/2020   HGB 12.4 07/29/2020   HCT 37.8 07/29/2020   MCV 99.0 07/29/2020   PLT 210 07/29/2020   NEUTROABS 5.4 07/21/2020    CMP  Lab Results  Component Value Date   NA 139 07/29/2020   K 3.6 07/29/2020   CL 103 07/29/2020   CO2 29 07/29/2020   GLUCOSE 111 (H) 07/29/2020   BUN 25 (H) 07/29/2020   CREATININE 1.32 (H) 07/29/2020   CALCIUM 8.8 (L) 07/29/2020   PROT 5.2 (L) 07/29/2020   ALBUMIN 3.0 (L) 07/29/2020   AST 40 07/29/2020   ALT 60 (H) 07/29/2020   ALKPHOS 64 07/29/2020   BILITOT 0.5 07/29/2020   GFRNONAA 41 (L) 07/29/2020   GFRAA 50 (L) 11/25/2019    Medications: I have reviewed the patient's current medications.   Assessment/Plan: 1. TTP diagnosed in 2005, treated with plasma exchange, steroids, and rituximab consolidation ? 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 Relapse of TTP March 2015-plasma exchange, steroids, rituximab, and maintenance azathioprine ? Admission with severe thrombocytopenia and elevated LDH 07/16/2020,ADAMTS13 activity less than 2%, consistent with relapse of TTP       -Steroids/FFP given 07/16/2020       -Daily  plasma exchange beginning 07/17/2020, 7 days, then qod starting 3/24, discharge 07/21/2020 on prednisone 60 mg daily  -Prednisone taper to 40 mg daily 07/29/2020 2. History of epidural abscess requiring laminectomy 2012 3. 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 COPD    Disposition: [name] has relapsed TTP.  She has responded to steroids and plasma exchange.  The plan is to begin rituximab today.  She reports tolerating rituximab well in the past.  The platelet count has normalized.  The LDH was mildly elevated on 07/29/2020.  She will complete another plasmapheresis tomorrow.  She will be scheduled for plasmapheresis on 08/03/2020 and 08/05/2020.  She will return for an office visit and rituximab on 08/06/2020.  She remains off of Imuran.  We will continue a slow prednisone taper.  Krystal Coder, MD  07/30/2020  8:19 AM

## 2020-07-30 NOTE — Patient Instructions (Signed)
Rituximab Injection What is this medicine? RITUXIMAB (ri TUX i mab) is a monoclonal antibody. It is used to treat certain types of cancer like non-Hodgkin lymphoma and chronic lymphocytic leukemia. It is also used to treat rheumatoid arthritis, granulomatosis with polyangiitis, microscopic polyangiitis, and pemphigus vulgaris. This medicine may be used for other purposes; ask your health care provider or pharmacist if you have questions. COMMON BRAND NAME(S): RIABNI, Rituxan, RUXIENCE What should I tell my health care provider before I take this medicine? They need to know if you have any of these conditions:  chest pain  heart disease  infection especially a viral infection such as chickenpox, cold sores, hepatitis B, or herpes  immune system problems  irregular heartbeat or rhythm  kidney disease  low blood counts (white cells, platelets, or red cells)  lung disease  recent or upcoming vaccine  an unusual or allergic reaction to rituximab, other medicines, foods, dyes, or preservatives  pregnant or trying to get pregnant  breast-feeding How should I use this medicine? This medicine is injected into a vein. It is given by a health care provider in a hospital or clinic setting. A special MedGuide will be given to you before each treatment. Be sure to read this information carefully each time. Talk to your health care provider about the use of this medicine in children. While this drug may be prescribed for children as young as 2 years for selected conditions, precautions do apply. Overdosage: If you think you have taken too much of this medicine contact a poison control center or emergency room at once. NOTE: This medicine is only for you. Do not share this medicine with others. What if I miss a dose? Keep appointments for follow-up doses. It is important not to miss your dose. Call your health care provider if you are unable to keep an appointment. What may interact with this  medicine? Do not take this medicine with any of the following medicines:  live vaccines This medicine may also interact with the following medicines:  cisplatin This list may not describe all possible interactions. Give your health care provider a list of all the medicines, herbs, non-prescription drugs, or dietary supplements you use. Also tell them if you smoke, drink alcohol, or use illegal drugs. Some items may interact with your medicine. What should I watch for while using this medicine? Your condition will be monitored carefully while you are receiving this medicine. You may need blood work done while you are taking this medicine. This medicine can cause serious infusion reactions. To reduce the risk your health care provider may give you other medicines to take before receiving this one. Be sure to follow the directions from your health care provider. This medicine may increase your risk of getting an infection. Call your health care provider for advice if you get a fever, chills, sore throat, or other symptoms of a cold or flu. Do not treat yourself. Try to avoid being around people who are sick. Call your health care provider if you are around anyone with measles, chickenpox, or if you develop sores or blisters that do not heal properly. Avoid taking medicines that contain aspirin, acetaminophen, ibuprofen, naproxen, or ketoprofen unless instructed by your health care provider. These medicines may hide a fever. This medicine may cause serious skin reactions. They can happen weeks to months after starting the medicine. Contact your health care provider right away if you notice fevers or flu-like symptoms with a rash. The rash may be red   or purple and then turn into blisters or peeling of the skin. Or, you might notice a red rash with swelling of the face, lips or lymph nodes in your neck or under your arms. In some patients, this medicine may cause a serious brain infection that may cause  death. If you have any problems seeing, thinking, speaking, walking, or standing, tell your healthcare professional right away. If you cannot reach your healthcare professional, urgently seek other source of medical care. Do not become pregnant while taking this medicine or for at least 12 months after stopping it. Women should inform their health care provider if they wish to become pregnant or think they might be pregnant. There is potential for serious harm to an unborn child. Talk to your health care provider for more information. Women should use a reliable form of birth control while taking this medicine and for 12 months after stopping it. Do not breast-feed while taking this medicine or for at least 6 months after stopping it. What side effects may I notice from receiving this medicine? Side effects that you should report to your health care provider as soon as possible:  allergic reactions (skin rash, itching or hives; swelling of the face, lips, or tongue)  diarrhea  edema (sudden weight gain; swelling of the ankles, feet, hands or other unusual swelling; trouble breathing)  fast, irregular heartbeat  heart attack (trouble breathing; pain or tightness in the chest, neck, back or arms; unusually weak or tired)  infection (fever, chills, cough, sore throat, pain or trouble passing urine)  kidney injury (trouble passing urine or change in the amount of urine)  liver injury (dark yellow or brown urine; general ill feeling or flu-like symptoms; loss of appetite, right upper belly pain; unusually weak or tired, yellowing of the eyes or skin)  low blood pressure (dizziness; feeling faint or lightheaded, falls; unusually weak or tired)  low red blood cell counts (trouble breathing; feeling faint; lightheaded, falls; unusually weak or tired)  mouth sores  redness, blistering, peeling, or loosening of the skin, including inside the mouth  stomach pain  unusual bruising or  bleeding  wheezing (trouble breathing with loud or whistling sounds)  vomiting Side effects that usually do not require medical attention (report to your health care provider if they continue or are bothersome):  headache  joint pain  muscle cramps, pain  nausea This list may not describe all possible side effects. Call your doctor for medical advice about side effects. You may report side effects to FDA at 1-800-FDA-1088. Where should I keep my medicine? This medicine is given in a hospital or clinic. It will not be stored at home. NOTE: This sheet is a summary. It may not cover all possible information. If you have questions about this medicine, talk to your doctor, pharmacist, or health care provider.  2021 Elsevier/Gold Standard (2020-01-30 21:35:50)

## 2020-07-31 ENCOUNTER — Non-Acute Institutional Stay (HOSPITAL_COMMUNITY)
Admission: RE | Admit: 2020-07-31 | Discharge: 2020-07-31 | Disposition: A | Discharge status: Home / Self Care | Payer: Medicare Other | Source: Ambulatory Visit | Attending: Oncology | Admitting: Oncology

## 2020-07-31 DIAGNOSIS — M3119 Other thrombotic microangiopathy: Secondary | ICD-10-CM | POA: Diagnosis not present

## 2020-07-31 LAB — COMPREHENSIVE METABOLIC PANEL
ALT: 36 U/L (ref 0–44)
AST: 26 U/L (ref 15–41)
Albumin: 3.1 g/dL — ABNORMAL LOW (ref 3.5–5.0)
Alkaline Phosphatase: 59 U/L (ref 38–126)
Anion gap: 12 (ref 5–15)
BUN: 22 mg/dL (ref 8–23)
CO2: 27 mmol/L (ref 22–32)
Calcium: 8.1 mg/dL — ABNORMAL LOW (ref 8.9–10.3)
Chloride: 102 mmol/L (ref 98–111)
Creatinine, Ser: 1.26 mg/dL — ABNORMAL HIGH (ref 0.44–1.00)
GFR, Estimated: 44 mL/min — ABNORMAL LOW (ref >60)
Glucose, Bld: 157 mg/dL — ABNORMAL HIGH (ref 70–99)
Potassium: 3.8 mmol/L (ref 3.5–5.1)
Sodium: 141 mmol/L (ref 135–145)
Total Bilirubin: 0.5 mg/dL (ref 0.3–1.2)
Total Protein: 5.4 g/dL — ABNORMAL LOW (ref 6.5–8.1)

## 2020-07-31 LAB — CBC
HCT: 38.8 % (ref 36.0–46.0)
Hemoglobin: 12.7 g/dL (ref 12.0–15.0)
MCH: 32.2 pg (ref 26.0–34.0)
MCHC: 32.7 g/dL (ref 30.0–36.0)
MCV: 98.2 fL (ref 80.0–100.0)
Platelets: 184 10*3/uL (ref 150–400)
RBC: 3.95 MIL/uL (ref 3.87–5.11)
RDW: 14.9 % (ref 11.5–15.5)
WBC: 8.3 10*3/uL (ref 4.0–10.5)
nRBC: 0 % (ref 0.0–0.2)

## 2020-07-31 LAB — LACTATE DEHYDROGENASE: LDH: 215 U/L — ABNORMAL HIGH (ref 98–192)

## 2020-07-31 LAB — FIBRINOGEN: Fibrinogen: 308 mg/dL (ref 210–475)

## 2020-07-31 MED ORDER — CALCIUM GLUCONATE-NACL 2-0.675 GM/100ML-% IV SOLN: 2.0000 g | Freq: Once | INTRAVENOUS | Status: DC

## 2020-07-31 MED ORDER — ANTICOAGULANT SODIUM CITRATE 4% (200MG/5ML) IV SOLN
5.0000 mL | Freq: Once | Status: AC
  Administered 2020-07-31: 5 mL
  Filled 2020-07-31: qty 5

## 2020-07-31 MED ORDER — ACD FORMULA A 0.73-2.45-2.2 GM/100ML VI SOLN: 1000.0000 mL | Status: DC

## 2020-07-31 MED ORDER — CALCIUM CARBONATE ANTACID 500 MG PO CHEW
CHEWABLE_TABLET | ORAL | Status: AC
  Administered 2020-07-31: 400 mg via ORAL
  Filled 2020-07-31: qty 2

## 2020-07-31 MED ORDER — ACETAMINOPHEN 325 MG PO TABS
ORAL_TABLET | ORAL | Status: AC
  Administered 2020-07-31: 650 mg via ORAL
  Filled 2020-07-31: qty 2

## 2020-07-31 MED ORDER — DIPHENHYDRAMINE HCL 25 MG PO CAPS
25.0000 mg | ORAL_CAPSULE | Freq: Four times a day (QID) | ORAL | Status: DC | PRN
Start: 2020-07-31 — End: 2020-07-31

## 2020-07-31 MED ORDER — ACD FORMULA A 0.73-2.45-2.2 GM/100ML VI SOLN
Status: AC
  Filled 2020-07-31: qty 500

## 2020-07-31 MED ORDER — ACETAMINOPHEN 325 MG PO TABS: 650.0000 mg | ORAL_TABLET | ORAL | Status: DC | PRN

## 2020-07-31 MED ORDER — DIPHENHYDRAMINE HCL 25 MG PO CAPS
ORAL_CAPSULE | ORAL | Status: AC
  Administered 2020-07-31: 25 mg via ORAL
  Filled 2020-07-31: qty 1

## 2020-07-31 MED ORDER — CALCIUM CARBONATE ANTACID 500 MG PO CHEW
2.0000 | CHEWABLE_TABLET | ORAL | Status: AC
  Administered 2020-07-31: 400 mg via ORAL

## 2020-07-31 MED ORDER — CALCIUM GLUCONATE-NACL 2-0.675 GM/100ML-% IV SOLN
INTRAVENOUS | Status: AC
  Filled 2020-07-31: qty 100

## 2020-07-31 NOTE — Progress Notes (Signed)
Therapeutic Plasma Exchange treatment complete. No  Complications. Patient departed unit via wheelchair alert  Oriented vitals stable w/o complaint by [name] [employer]. Patient aware next treatment in 4/4/222

## 2020-08-01 ENCOUNTER — Other Ambulatory Visit: Payer: Self-pay | Admitting: Oncology

## 2020-08-01 LAB — THERAPEUTIC PLASMA EXCHANGE (BLOOD BANK)
Plasma Exchange: 2500
Plasma volume needed: 2500
Unit division: 0
Unit division: 0
Unit division: 0
Unit division: 0
Unit division: 0

## 2020-08-03 ENCOUNTER — Non-Acute Institutional Stay (HOSPITAL_COMMUNITY)
Admission: RE | Admit: 2020-08-03 | Discharge: 2020-08-03 | Disposition: A | Discharge status: Home / Self Care | Payer: Medicare Other | Source: Ambulatory Visit | Attending: Oncology | Admitting: Oncology

## 2020-08-03 ENCOUNTER — Telehealth: Payer: Self-pay | Admitting: *Deleted

## 2020-08-03 DIAGNOSIS — M3119 Other thrombotic microangiopathy: Secondary | ICD-10-CM | POA: Diagnosis not present

## 2020-08-03 LAB — COMPREHENSIVE METABOLIC PANEL
ALT: 32 U/L (ref 0–44)
AST: 26 U/L (ref 15–41)
Albumin: 3 g/dL — ABNORMAL LOW (ref 3.5–5.0)
Alkaline Phosphatase: 61 U/L (ref 38–126)
Anion gap: 11 (ref 5–15)
BUN: 30 mg/dL — ABNORMAL HIGH (ref 8–23)
CO2: 26 mmol/L (ref 22–32)
Calcium: 8.4 mg/dL — ABNORMAL LOW (ref 8.9–10.3)
Chloride: 104 mmol/L (ref 98–111)
Creatinine, Ser: 1.13 mg/dL — ABNORMAL HIGH (ref 0.44–1.00)
GFR, Estimated: 50 mL/min — ABNORMAL LOW (ref >60)
Glucose, Bld: 106 mg/dL — ABNORMAL HIGH (ref 70–99)
Potassium: 4.4 mmol/L (ref 3.5–5.1)
Sodium: 141 mmol/L (ref 135–145)
Total Bilirubin: 0.6 mg/dL (ref 0.3–1.2)
Total Protein: 5.5 g/dL — ABNORMAL LOW (ref 6.5–8.1)

## 2020-08-03 LAB — CBC
HCT: 37.7 % (ref 36.0–46.0)
Hemoglobin: 12.3 g/dL (ref 12.0–15.0)
MCH: 33.2 pg (ref 26.0–34.0)
MCHC: 32.6 g/dL (ref 30.0–36.0)
MCV: 101.6 fL — ABNORMAL HIGH (ref 80.0–100.0)
Platelets: 126 10*3/uL — ABNORMAL LOW (ref 150–400)
RBC: 3.71 MIL/uL — ABNORMAL LOW (ref 3.87–5.11)
RDW: 15.7 % — ABNORMAL HIGH (ref 11.5–15.5)
WBC: 8.1 10*3/uL (ref 4.0–10.5)
nRBC: 0 % (ref 0.0–0.2)

## 2020-08-03 LAB — FIBRINOGEN: Fibrinogen: 244 mg/dL (ref 210–475)

## 2020-08-03 LAB — LACTATE DEHYDROGENASE: LDH: 242 U/L — ABNORMAL HIGH (ref 98–192)

## 2020-08-03 MED ORDER — DIPHENHYDRAMINE HCL 25 MG PO CAPS: 25.0000 mg | ORAL_CAPSULE | Freq: Four times a day (QID) | ORAL | Status: DC | PRN

## 2020-08-03 MED ORDER — CALCIUM CARBONATE ANTACID 500 MG PO CHEW: 2.0000 | CHEWABLE_TABLET | ORAL | Status: DC

## 2020-08-03 MED ORDER — CALCIUM GLUCONATE-NACL 2-0.675 GM/100ML-% IV SOLN: 2.0000 g | Freq: Once | INTRAVENOUS | Status: AC

## 2020-08-03 MED ORDER — ACETAMINOPHEN 325 MG PO TABS
ORAL_TABLET | ORAL | Status: AC
  Administered 2020-08-03: 650 mg via ORAL
  Filled 2020-08-03: qty 2

## 2020-08-03 MED ORDER — CALCIUM GLUCONATE-NACL 2-0.675 GM/100ML-% IV SOLN
INTRAVENOUS | Status: AC
  Administered 2020-08-03: 2000 mg via INTRAVENOUS
  Filled 2020-08-03: qty 100

## 2020-08-03 MED ORDER — ANTICOAGULANT SODIUM CITRATE 4% (200MG/5ML) IV SOLN
5.0000 mL | Freq: Once | Status: AC
  Administered 2020-08-03: 5 mL
  Filled 2020-08-03: qty 5

## 2020-08-03 MED ORDER — ACD FORMULA A 0.73-2.45-2.2 GM/100ML VI SOLN
Status: AC
  Administered 2020-08-03: 1000 mL
  Filled 2020-08-03: qty 500

## 2020-08-03 MED ORDER — ACD FORMULA A 0.73-2.45-2.2 GM/100ML VI SOLN: 1000.0000 mL | Status: DC

## 2020-08-03 MED ORDER — DIPHENHYDRAMINE HCL 25 MG PO CAPS
ORAL_CAPSULE | ORAL | Status: AC
  Administered 2020-08-03: 25 mg via ORAL
  Filled 2020-08-03: qty 1

## 2020-08-03 MED ORDER — ACETAMINOPHEN 325 MG PO TABS: 650.0000 mg | ORAL_TABLET | ORAL | Status: DC | PRN

## 2020-08-03 MED ORDER — CALCIUM CARBONATE ANTACID 500 MG PO CHEW
CHEWABLE_TABLET | ORAL | Status: AC
  Administered 2020-08-03: 400 mg via ORAL
  Filled 2020-08-03: qty 2

## 2020-08-03 NOTE — Progress Notes (Signed)
Therapeutic Plasma Exchange complete. Tolerated well. No complications. Removed right neck dressing. Bruising on site observed. Left neck catheter dressing changed. Patient advised MD appointment on Friday is scheduled to be here @ 0800. Paged MD to clarify awaiting response.Patient departed unit alert oriented vitals stable w/o complaint.

## 2020-08-03 NOTE — Telephone Encounter (Signed)
Notified daughter that FMLA form has been completed and faxed to [employer], att: Candise Bowens at [phone] with confirmation receipt noted. Will keep copy for daughter to pick up this week and sent copy to HIM to scan. Informed daughter that dialysis has been notified for need for plasma pheresis again on 4/6 and 4/8, based on how she is doing. On schedule for Rituxan on 4/8--per Dr. Benay Spice, try to move to 4/7 if possible. Scheduling message sent.

## 2020-08-04 LAB — THERAPEUTIC PLASMA EXCHANGE (BLOOD BANK)
Plasma volume needed: 2500
Unit division: 0
Unit division: 0
Unit division: 0
Unit division: 0

## 2020-08-05 ENCOUNTER — Non-Acute Institutional Stay (HOSPITAL_COMMUNITY)
Admission: RE | Admit: 2020-08-05 | Discharge: 2020-08-05 | Disposition: A | Discharge status: Home / Self Care | Payer: Medicare Other | Source: Ambulatory Visit | Attending: Oncology | Admitting: Oncology

## 2020-08-05 DIAGNOSIS — M3119 Other thrombotic microangiopathy: Secondary | ICD-10-CM | POA: Diagnosis present

## 2020-08-05 LAB — CBC
HCT: 41.1 % (ref 36.0–46.0)
Hemoglobin: 13.2 g/dL (ref 12.0–15.0)
MCH: 32.2 pg (ref 26.0–34.0)
MCHC: 32.1 g/dL (ref 30.0–36.0)
MCV: 100.2 fL — ABNORMAL HIGH (ref 80.0–100.0)
Platelets: 95 10*3/uL — ABNORMAL LOW (ref 150–400)
RBC: 4.1 MIL/uL (ref 3.87–5.11)
RDW: 15.7 % — ABNORMAL HIGH (ref 11.5–15.5)
WBC: 9.9 10*3/uL (ref 4.0–10.5)
nRBC: 0 % (ref 0.0–0.2)

## 2020-08-05 LAB — COMPREHENSIVE METABOLIC PANEL
ALT: 40 U/L (ref 0–44)
AST: 41 U/L (ref 15–41)
Albumin: 3.3 g/dL — ABNORMAL LOW (ref 3.5–5.0)
Alkaline Phosphatase: 76 U/L (ref 38–126)
Anion gap: 8 (ref 5–15)
BUN: 26 mg/dL — ABNORMAL HIGH (ref 8–23)
CO2: 28 mmol/L (ref 22–32)
Calcium: 8.8 mg/dL — ABNORMAL LOW (ref 8.9–10.3)
Chloride: 100 mmol/L (ref 98–111)
Creatinine, Ser: 1.12 mg/dL — ABNORMAL HIGH (ref 0.44–1.00)
GFR, Estimated: 50 mL/min — ABNORMAL LOW (ref >60)
Glucose, Bld: 126 mg/dL — ABNORMAL HIGH (ref 70–99)
Potassium: 4.5 mmol/L (ref 3.5–5.1)
Sodium: 136 mmol/L (ref 135–145)
Total Bilirubin: 0.7 mg/dL (ref 0.3–1.2)
Total Protein: 5.4 g/dL — ABNORMAL LOW (ref 6.5–8.1)

## 2020-08-05 LAB — LACTATE DEHYDROGENASE: LDH: 273 U/L — ABNORMAL HIGH (ref 98–192)

## 2020-08-05 MED ORDER — ACD FORMULA A 0.73-2.45-2.2 GM/100ML VI SOLN: 1000.0000 mL | Status: DC

## 2020-08-05 MED ORDER — ANTICOAGULANT SODIUM CITRATE 4% (200MG/5ML) IV SOLN
5.0000 mL | Freq: Once | Status: DC
  Filled 2020-08-05: qty 5

## 2020-08-05 MED ORDER — DIPHENHYDRAMINE HCL 25 MG PO CAPS
ORAL_CAPSULE | ORAL | Status: AC
  Administered 2020-08-05: 25 mg via ORAL
  Filled 2020-08-05: qty 1

## 2020-08-05 MED ORDER — ACETAMINOPHEN 325 MG PO TABS: 650.0000 mg | ORAL_TABLET | ORAL | Status: DC | PRN

## 2020-08-05 MED ORDER — ANTICOAGULANT SODIUM CITRATE 4% (200MG/5ML) IV SOLN
5.0000 mL | Status: DC
  Filled 2020-08-05: qty 5

## 2020-08-05 MED ORDER — CALCIUM GLUCONATE-NACL 2-0.675 GM/100ML-% IV SOLN: 2.0000 g | Freq: Once | INTRAVENOUS | Status: DC

## 2020-08-05 MED ORDER — DIPHENHYDRAMINE HCL 25 MG PO CAPS: 25.0000 mg | ORAL_CAPSULE | Freq: Four times a day (QID) | ORAL | Status: DC | PRN

## 2020-08-05 MED ORDER — ACETAMINOPHEN 325 MG PO TABS
ORAL_TABLET | ORAL | Status: AC
  Administered 2020-08-05: 650 mg via ORAL
  Filled 2020-08-05: qty 2

## 2020-08-05 MED ORDER — CALCIUM GLUCONATE-NACL 2-0.675 GM/100ML-% IV SOLN
INTRAVENOUS | Status: AC
  Administered 2020-08-05: 2000 mg via INTRAVENOUS_CENTRAL
  Filled 2020-08-05: qty 100

## 2020-08-05 MED ORDER — CALCIUM CARBONATE ANTACID 500 MG PO CHEW
CHEWABLE_TABLET | ORAL | Status: AC
  Administered 2020-08-05: 400 mg via ORAL
  Filled 2020-08-05: qty 4

## 2020-08-05 MED ORDER — ANTICOAGULANT SODIUM CITRATE 4% (200MG/5ML) IV SOLN: 5.0000 mL | Freq: Once | Status: DC

## 2020-08-05 MED ORDER — CALCIUM CARBONATE ANTACID 500 MG PO CHEW
2.0000 | CHEWABLE_TABLET | ORAL | Status: DC
Start: 2020-08-05 — End: 2020-08-05

## 2020-08-05 MED ORDER — ACD FORMULA A 0.73-2.45-2.2 GM/100ML VI SOLN
Status: AC
  Administered 2020-08-05: 1000 mL
  Filled 2020-08-05: qty 500

## 2020-08-05 MED ORDER — CALCIUM CARBONATE ANTACID 500 MG PO CHEW: 2.0000 | CHEWABLE_TABLET | ORAL | Status: DC

## 2020-08-05 NOTE — Progress Notes (Signed)
tx completed without complications; pt denies pain, n/v, dizziness, SOB, dizziness, weakness or paresthesia. Pt accompanied to the main entrance by Willia Craze, CRRT on a wheelchair to her waiting daughter.

## 2020-08-06 ENCOUNTER — Inpatient Hospital Stay: Payer: Medicare Other | Attending: Oncology | Admitting: Oncology

## 2020-08-06 ENCOUNTER — Inpatient Hospital Stay: Payer: Medicare Other

## 2020-08-06 ENCOUNTER — Telehealth: Payer: Self-pay | Admitting: Oncology

## 2020-08-06 ENCOUNTER — Other Ambulatory Visit: Payer: Self-pay

## 2020-08-06 VITALS — BP 139/57 | HR 98 | Temp 98.1°F | Resp 18 | Ht 64.0 in | Wt 163.0 lb

## 2020-08-06 VITALS — BP 143/60 | HR 86 | Temp 97.3°F | Resp 20

## 2020-08-06 DIAGNOSIS — J449 Chronic obstructive pulmonary disease, unspecified: Secondary | ICD-10-CM | POA: Diagnosis not present

## 2020-08-06 DIAGNOSIS — Z862 Personal history of diseases of the blood and blood-forming organs and certain disorders involving the immune mechanism: Secondary | ICD-10-CM

## 2020-08-06 DIAGNOSIS — R269 Unspecified abnormalities of gait and mobility: Secondary | ICD-10-CM | POA: Diagnosis not present

## 2020-08-06 DIAGNOSIS — G8929 Other chronic pain: Secondary | ICD-10-CM | POA: Insufficient documentation

## 2020-08-06 DIAGNOSIS — M3119 Other thrombotic microangiopathy: Secondary | ICD-10-CM | POA: Diagnosis present

## 2020-08-06 DIAGNOSIS — R7402 Elevation of levels of lactic acid dehydrogenase (LDH): Secondary | ICD-10-CM | POA: Diagnosis not present

## 2020-08-06 DIAGNOSIS — Z5112 Encounter for antineoplastic immunotherapy: Secondary | ICD-10-CM | POA: Diagnosis present

## 2020-08-06 DIAGNOSIS — Z79899 Other long term (current) drug therapy: Secondary | ICD-10-CM | POA: Insufficient documentation

## 2020-08-06 DIAGNOSIS — M549 Dorsalgia, unspecified: Secondary | ICD-10-CM | POA: Insufficient documentation

## 2020-08-06 LAB — THERAPEUTIC PLASMA EXCHANGE (BLOOD BANK)
Plasma Exchange: 2328
Plasma volume needed: 2328
Unit division: 0
Unit division: 0
Unit division: 0
Unit division: 0
Unit division: 0
Unit division: 0

## 2020-08-06 MED ORDER — ACETAMINOPHEN 325 MG PO TABS
650.0000 mg | ORAL_TABLET | Freq: Once | ORAL | Status: AC
  Administered 2020-08-06: 650 mg via ORAL

## 2020-08-06 MED ORDER — DIPHENHYDRAMINE HCL 25 MG PO CAPS
ORAL_CAPSULE | ORAL | Status: AC
  Filled 2020-08-06: qty 2

## 2020-08-06 MED ORDER — ACETAMINOPHEN 325 MG PO TABS
ORAL_TABLET | ORAL | Status: AC
  Filled 2020-08-06: qty 2

## 2020-08-06 MED ORDER — SODIUM CHLORIDE 0.9 % IV SOLN
375.0000 mg/m2 | Freq: Once | INTRAVENOUS | Status: AC
  Administered 2020-08-06: 700 mg via INTRAVENOUS
  Filled 2020-08-06: qty 50

## 2020-08-06 MED ORDER — DIPHENHYDRAMINE HCL 25 MG PO CAPS
50.0000 mg | ORAL_CAPSULE | Freq: Once | ORAL | Status: AC
  Administered 2020-08-06: 50 mg via ORAL

## 2020-08-06 MED ORDER — SODIUM CHLORIDE 0.9 % IV SOLN
Freq: Once | INTRAVENOUS | Status: AC
  Filled 2020-08-06: qty 250

## 2020-08-06 NOTE — Telephone Encounter (Signed)
Scheduled appt per 4/7 los - gave patient schedule in infusion.

## 2020-08-06 NOTE — Progress Notes (Signed)
  [facility] OFFICE PROGRESS NOTE   Diagnosis: TTP  INTERVAL HISTORY:   [name] returns as scheduled.  She is here with her daughter.  She completed rituximab therapy on 07/30/2020.  No symptom of an allergic reaction.  She feels well.  No fever.  No complaint. She continued plasmapheresis on 07/31/2020, 08/03/2020, and 08/05/2020.  Objective:  Vital signs in last 24 hours:  Blood pressure (!) 139/57, pulse 98, temperature 98.1 F (36.7 C), temperature source Tympanic, resp. rate 18, height 5\' 4"  (1.626 m), weight 163 lb (73.9 kg), SpO2 97 %.    HEENT: No thrush Resp: Lungs clear bilaterally Cardio: Regular rate and rhythm GI: No hepatosplenomegaly Vascular: Trace pedal edema bilaterally   Portacath/PICC-without erythema  Lab Results:  Lab Results  Component Value Date   WBC 9.9 08/05/2020   HGB 13.2 08/05/2020   HCT 41.1 08/05/2020   MCV 100.2 (H) 08/05/2020   PLT 95 (L) 08/05/2020   NEUTROABS 5.4 07/21/2020    CMP  Lab Results  Component Value Date   NA 136 08/05/2020   K 4.5 08/05/2020   CL 100 08/05/2020   CO2 28 08/05/2020   GLUCOSE 126 (H) 08/05/2020   BUN 26 (H) 08/05/2020   CREATININE 1.12 (H) 08/05/2020   CALCIUM 8.8 (L) 08/05/2020   PROT 5.4 (L) 08/05/2020   ALBUMIN 3.3 (L) 08/05/2020   AST 41 08/05/2020   ALT 40 08/05/2020   ALKPHOS 76 08/05/2020   BILITOT 0.7 08/05/2020   GFRNONAA 50 (L) 08/05/2020   GFRAA 50 (L) 11/25/2019     Medications: I have reviewed the patient's current medications.   Assessment/Plan: 1. TTP diagnosed in 2005, treated with plasma exchange, steroids, and rituximab consolidation ? 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 Relapse of TTP March 2015-plasma exchange, steroids, rituximab, and maintenance azathioprine ? Admission with severe thrombocytopenia and elevated LDH 07/16/2020,ADAMTS13 activity less than 2%, consistent with relapse of TTP       -Steroids/FFP given 07/16/2020        -Daily plasma exchange beginning 07/17/2020, 7 days, then qod starting 3/24, discharge 07/21/2020 on prednisone 60 mg daily  -Prednisone taper to 40 mg daily 07/29/2020       -rituximab 07/30/2020, 08/06/2020       -plasmapheresis on 07/31/2020, 08/03/2020, 08/05/2020, and 08/07/2020 2. History of epidural abscess requiring laminectomy 2012 3. 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 COPD    Disposition: [name] appears well.  She tolerated the first treatment with rituximab without acute toxicity.  She will complete week #2 rituximab today.  The platelet count is slightly lower this week and the LDH is mildly elevated.  She will complete another treatment of plasmapheresis today.  She will continue prednisone at a dose of 40 mg daily.  We will check a CBC, chemistry panel, and LDH tomorrow.  We will arrange for additional plasmapheresis depending on tomorrow's labs.  She will return for an office visit and rituximab in 1 week.  Krystal Coder, MD  08/06/2020  11:08 AM

## 2020-08-07 ENCOUNTER — Non-Acute Institutional Stay (HOSPITAL_COMMUNITY)
Admission: RE | Admit: 2020-08-07 | Discharge: 2020-08-07 | Disposition: A | Discharge status: Home / Self Care | Payer: Medicare Other | Source: Ambulatory Visit | Attending: Oncology | Admitting: Oncology

## 2020-08-07 ENCOUNTER — Other Ambulatory Visit: Payer: Medicare Other

## 2020-08-07 ENCOUNTER — Other Ambulatory Visit: Payer: Self-pay

## 2020-08-07 ENCOUNTER — Ambulatory Visit: Payer: Medicare Other | Admitting: Oncology

## 2020-08-07 ENCOUNTER — Ambulatory Visit: Payer: Medicare Other

## 2020-08-07 ENCOUNTER — Non-Acute Institutional Stay (HOSPITAL_COMMUNITY): Admission: RE | Admit: 2020-08-07 | Payer: Medicare Other | Source: Ambulatory Visit | Admitting: Oncology

## 2020-08-07 ENCOUNTER — Other Ambulatory Visit: Payer: Self-pay | Admitting: Nurse Practitioner

## 2020-08-07 DIAGNOSIS — M3119 Other thrombotic microangiopathy: Secondary | ICD-10-CM | POA: Diagnosis present

## 2020-08-07 LAB — COMPREHENSIVE METABOLIC PANEL
ALT: 37 U/L (ref 0–44)
AST: 35 U/L (ref 15–41)
Albumin: 3.3 g/dL — ABNORMAL LOW (ref 3.5–5.0)
Alkaline Phosphatase: 59 U/L (ref 38–126)
Anion gap: 5 (ref 5–15)
BUN: 25 mg/dL — ABNORMAL HIGH (ref 8–23)
CO2: 30 mmol/L (ref 22–32)
Calcium: 8.9 mg/dL (ref 8.9–10.3)
Chloride: 104 mmol/L (ref 98–111)
Creatinine, Ser: 1.1 mg/dL — ABNORMAL HIGH (ref 0.44–1.00)
GFR, Estimated: 51 mL/min — ABNORMAL LOW (ref >60)
Glucose, Bld: 99 mg/dL (ref 70–99)
Potassium: 4 mmol/L (ref 3.5–5.1)
Sodium: 139 mmol/L (ref 135–145)
Total Bilirubin: 0.4 mg/dL (ref 0.3–1.2)
Total Protein: 5.5 g/dL — ABNORMAL LOW (ref 6.5–8.1)

## 2020-08-07 LAB — CBC
HCT: 41 % (ref 36.0–46.0)
Hemoglobin: 13.3 g/dL (ref 12.0–15.0)
MCH: 32.4 pg (ref 26.0–34.0)
MCHC: 32.4 g/dL (ref 30.0–36.0)
MCV: 99.8 fL (ref 80.0–100.0)
Platelets: 83 10*3/uL — ABNORMAL LOW (ref 150–400)
RBC: 4.11 MIL/uL (ref 3.87–5.11)
RDW: 15.8 % — ABNORMAL HIGH (ref 11.5–15.5)
WBC: 10.9 10*3/uL — ABNORMAL HIGH (ref 4.0–10.5)
nRBC: 0 % (ref 0.0–0.2)

## 2020-08-07 LAB — LACTATE DEHYDROGENASE: LDH: 233 U/L — ABNORMAL HIGH (ref 98–192)

## 2020-08-07 MED ORDER — DIPHENHYDRAMINE HCL 25 MG PO CAPS: 25.0000 mg | ORAL_CAPSULE | Freq: Four times a day (QID) | ORAL | Status: DC | PRN

## 2020-08-07 MED ORDER — ANTICOAGULANT SODIUM CITRATE 4% (200MG/5ML) IV SOLN
5.0000 mL | Freq: Once | Status: AC
  Administered 2020-08-07: 5 mL
  Filled 2020-08-07: qty 5

## 2020-08-07 MED ORDER — CALCIUM GLUCONATE-NACL 2-0.675 GM/100ML-% IV SOLN
INTRAVENOUS | Status: AC
  Administered 2020-08-07: 2000 mg via INTRAVENOUS
  Filled 2020-08-07: qty 100

## 2020-08-07 MED ORDER — CALCIUM CARBONATE ANTACID 500 MG PO CHEW
CHEWABLE_TABLET | ORAL | Status: AC
  Administered 2020-08-07: 400 mg via ORAL
  Filled 2020-08-07: qty 4

## 2020-08-07 MED ORDER — ACD FORMULA A 0.73-2.45-2.2 GM/100ML VI SOLN
Status: AC
  Administered 2020-08-07: 1000 mL
  Filled 2020-08-07: qty 500

## 2020-08-07 MED ORDER — PREDNISONE 20 MG PO TABS: 20.0000 mg | ORAL_TABLET | Freq: Two times a day (BID) | ORAL | 1 refills | Status: DC

## 2020-08-07 MED ORDER — ACETAMINOPHEN 325 MG PO TABS
ORAL_TABLET | ORAL | Status: AC
  Administered 2020-08-07: 650 mg via ORAL
  Filled 2020-08-07: qty 2

## 2020-08-07 MED ORDER — ACD FORMULA A 0.73-2.45-2.2 GM/100ML VI SOLN: 1000.0000 mL | Status: DC

## 2020-08-07 MED ORDER — ACETAMINOPHEN 325 MG PO TABS: 650.0000 mg | ORAL_TABLET | ORAL | Status: DC | PRN

## 2020-08-07 MED ORDER — CALCIUM GLUCONATE-NACL 2-0.675 GM/100ML-% IV SOLN: 2.0000 g | Freq: Once | INTRAVENOUS | Status: AC

## 2020-08-07 MED ORDER — CALCIUM CARBONATE ANTACID 500 MG PO CHEW
2.0000 | CHEWABLE_TABLET | ORAL | Status: AC
  Administered 2020-08-07: 400 mg via ORAL

## 2020-08-07 MED ORDER — DIPHENHYDRAMINE HCL 25 MG PO CAPS
ORAL_CAPSULE | ORAL | Status: AC
  Administered 2020-08-07: 25 mg via ORAL
  Filled 2020-08-07: qty 1

## 2020-08-07 NOTE — Progress Notes (Signed)
Pt denies pain, n/v, SOB, dizziness, weakness ot paresthesia; I accompanied to the main entrance on a wheelchair to her awaiting daughter.

## 2020-08-08 LAB — THERAPEUTIC PLASMA EXCHANGE (BLOOD BANK)
Plasma Exchange: 2200
Plasma volume needed: 2200
Unit division: 0
Unit division: 0

## 2020-08-09 ENCOUNTER — Other Ambulatory Visit: Payer: Self-pay | Admitting: Oncology

## 2020-08-10 ENCOUNTER — Inpatient Hospital Stay: Payer: Medicare Other

## 2020-08-10 ENCOUNTER — Telehealth: Payer: Self-pay | Admitting: *Deleted

## 2020-08-10 ENCOUNTER — Other Ambulatory Visit: Payer: Self-pay

## 2020-08-10 DIAGNOSIS — Z862 Personal history of diseases of the blood and blood-forming organs and certain disorders involving the immune mechanism: Secondary | ICD-10-CM

## 2020-08-10 DIAGNOSIS — Z5112 Encounter for antineoplastic immunotherapy: Secondary | ICD-10-CM | POA: Diagnosis not present

## 2020-08-10 DIAGNOSIS — M3119 Other thrombotic microangiopathy: Secondary | ICD-10-CM

## 2020-08-10 LAB — CBC WITH DIFFERENTIAL (CANCER CENTER ONLY)
Abs Immature Granulocytes: 0.25 10*3/uL — ABNORMAL HIGH (ref 0.00–0.07)
Basophils Absolute: 0.1 10*3/uL (ref 0.0–0.1)
Basophils Relative: 1 %
Eosinophils Absolute: 0.1 10*3/uL (ref 0.0–0.5)
Eosinophils Relative: 1 %
HCT: 43.8 % (ref 36.0–46.0)
Hemoglobin: 13.8 g/dL (ref 12.0–15.0)
Immature Granulocytes: 3 %
Lymphocytes Relative: 35 %
Lymphs Abs: 3.5 10*3/uL (ref 0.7–4.0)
MCH: 31.9 pg (ref 26.0–34.0)
MCHC: 31.5 g/dL (ref 30.0–36.0)
MCV: 101.4 fL — ABNORMAL HIGH (ref 80.0–100.0)
Monocytes Absolute: 0.7 10*3/uL (ref 0.1–1.0)
Monocytes Relative: 7 %
Neutro Abs: 5.4 10*3/uL (ref 1.7–7.7)
Neutrophils Relative %: 53 %
Platelet Count: 81 10*3/uL — ABNORMAL LOW (ref 150–400)
RBC: 4.32 MIL/uL (ref 3.87–5.11)
RDW: 16.1 % — ABNORMAL HIGH (ref 11.5–15.5)
WBC Count: 10 10*3/uL (ref 4.0–10.5)
nRBC: 0 % (ref 0.0–0.2)

## 2020-08-10 LAB — CMP (CANCER CENTER ONLY)
ALT: 43 U/L (ref 0–44)
AST: 39 U/L (ref 15–41)
Albumin: 3.7 g/dL (ref 3.5–5.0)
Alkaline Phosphatase: 68 U/L (ref 38–126)
Anion gap: 5 (ref 5–15)
BUN: 33 mg/dL — ABNORMAL HIGH (ref 8–23)
CO2: 29 mmol/L (ref 22–32)
Calcium: 8.6 mg/dL — ABNORMAL LOW (ref 8.9–10.3)
Chloride: 107 mmol/L (ref 98–111)
Creatinine: 1.24 mg/dL — ABNORMAL HIGH (ref 0.44–1.00)
GFR, Estimated: 45 mL/min — ABNORMAL LOW (ref >60)
Glucose, Bld: 135 mg/dL — ABNORMAL HIGH (ref 70–99)
Potassium: 5 mmol/L (ref 3.5–5.1)
Sodium: 141 mmol/L (ref 135–145)
Total Bilirubin: 0.5 mg/dL (ref 0.3–1.2)
Total Protein: 5.9 g/dL — ABNORMAL LOW (ref 6.5–8.1)

## 2020-08-10 LAB — LACTATE DEHYDROGENASE: LDH: 290 U/L — ABNORMAL HIGH (ref 98–192)

## 2020-08-10 LAB — FIBRINOGEN: Fibrinogen: 228 mg/dL (ref 210–475)

## 2020-08-10 NOTE — Telephone Encounter (Signed)
Provided husband w/platelet and Hgb result and that MD considers this stable. F/U for OV and Rituxan on 09/12/20 at 0830 as scheduled. He will pass message on to patient-she was out shopping.

## 2020-08-10 NOTE — Telephone Encounter (Signed)
-----   Message from Ladell Pier, MD sent at 08/10/2020  1:39 PM EDT ----- Please call patient, platelets are stable, hemoglobin is normal, continue prednisone at the current dose, follow-up for an office visit in [place] as scheduled

## 2020-08-11 ENCOUNTER — Other Ambulatory Visit: Payer: Self-pay | Admitting: *Deleted

## 2020-08-11 MED ORDER — PREDNISONE 20 MG PO TABS: 20.0000 mg | ORAL_TABLET | Freq: Two times a day (BID) | ORAL | 1 refills | Status: DC

## 2020-08-13 ENCOUNTER — Inpatient Hospital Stay: Payer: Medicare Other

## 2020-08-13 ENCOUNTER — Other Ambulatory Visit: Payer: Self-pay

## 2020-08-13 ENCOUNTER — Encounter: Payer: Self-pay | Admitting: Nurse Practitioner

## 2020-08-13 ENCOUNTER — Other Ambulatory Visit: Payer: Self-pay | Admitting: Nurse Practitioner

## 2020-08-13 ENCOUNTER — Inpatient Hospital Stay (HOSPITAL_BASED_OUTPATIENT_CLINIC_OR_DEPARTMENT_OTHER): Payer: Medicare Other | Admitting: Nurse Practitioner

## 2020-08-13 VITALS — BP 149/62 | HR 91 | Temp 97.8°F | Resp 18 | Ht 64.0 in | Wt 161.8 lb

## 2020-08-13 VITALS — BP 144/67 | HR 85 | Temp 98.5°F | Resp 20

## 2020-08-13 DIAGNOSIS — M3119 Other thrombotic microangiopathy: Secondary | ICD-10-CM

## 2020-08-13 DIAGNOSIS — J449 Chronic obstructive pulmonary disease, unspecified: Secondary | ICD-10-CM

## 2020-08-13 DIAGNOSIS — Z862 Personal history of diseases of the blood and blood-forming organs and certain disorders involving the immune mechanism: Secondary | ICD-10-CM

## 2020-08-13 DIAGNOSIS — Z5112 Encounter for antineoplastic immunotherapy: Secondary | ICD-10-CM | POA: Diagnosis not present

## 2020-08-13 LAB — CBC WITH DIFFERENTIAL (CANCER CENTER ONLY)
Abs Immature Granulocytes: 0.22 10*3/uL — ABNORMAL HIGH (ref 0.00–0.07)
Basophils Absolute: 0 10*3/uL (ref 0.0–0.1)
Basophils Relative: 0 %
Eosinophils Absolute: 0 10*3/uL (ref 0.0–0.5)
Eosinophils Relative: 0 %
HCT: 40.7 % (ref 36.0–46.0)
Hemoglobin: 13.5 g/dL (ref 12.0–15.0)
Immature Granulocytes: 2 %
Lymphocytes Relative: 20 %
Lymphs Abs: 1.9 10*3/uL (ref 0.7–4.0)
MCH: 32.4 pg (ref 26.0–34.0)
MCHC: 33.2 g/dL (ref 30.0–36.0)
MCV: 97.6 fL (ref 80.0–100.0)
Monocytes Absolute: 0.6 10*3/uL (ref 0.1–1.0)
Monocytes Relative: 7 %
Neutro Abs: 6.6 10*3/uL (ref 1.7–7.7)
Neutrophils Relative %: 71 %
Platelet Count: 83 10*3/uL — ABNORMAL LOW (ref 150–400)
RBC: 4.17 MIL/uL (ref 3.87–5.11)
RDW: 15.9 % — ABNORMAL HIGH (ref 11.5–15.5)
WBC Count: 9.4 10*3/uL (ref 4.0–10.5)
nRBC: 0.2 % (ref 0.0–0.2)

## 2020-08-13 LAB — CMP (CANCER CENTER ONLY)
ALT: 37 U/L (ref 0–44)
AST: 29 U/L (ref 15–41)
Albumin: 3.8 g/dL (ref 3.5–5.0)
Alkaline Phosphatase: 75 U/L (ref 38–126)
Anion gap: 8 (ref 5–15)
BUN: 29 mg/dL — ABNORMAL HIGH (ref 8–23)
CO2: 26 mmol/L (ref 22–32)
Calcium: 9 mg/dL (ref 8.9–10.3)
Chloride: 104 mmol/L (ref 98–111)
Creatinine: 1.06 mg/dL — ABNORMAL HIGH (ref 0.44–1.00)
GFR, Estimated: 54 mL/min — ABNORMAL LOW (ref >60)
Glucose, Bld: 92 mg/dL (ref 70–99)
Potassium: 4.2 mmol/L (ref 3.5–5.1)
Sodium: 138 mmol/L (ref 135–145)
Total Bilirubin: 0.6 mg/dL (ref 0.3–1.2)
Total Protein: 6.1 g/dL — ABNORMAL LOW (ref 6.5–8.1)

## 2020-08-13 LAB — LACTATE DEHYDROGENASE
LDH: 351 U/L — ABNORMAL HIGH (ref 98–192)
LDH: 328 U/L — ABNORMAL HIGH (ref 98–192)

## 2020-08-13 MED ORDER — ACETAMINOPHEN 325 MG PO TABS
650.0000 mg | ORAL_TABLET | Freq: Once | ORAL | Status: AC
Start: 2020-08-13 — End: 2020-08-13
  Administered 2020-08-13: 650 mg via ORAL

## 2020-08-13 MED ORDER — ACETAMINOPHEN 325 MG PO TABS
ORAL_TABLET | ORAL | Status: AC
  Filled 2020-08-13: qty 2

## 2020-08-13 MED ORDER — HEPARIN SOD (PORK) LOCK FLUSH 100 UNIT/ML IV SOLN
250.0000 [IU] | Freq: Once | INTRAVENOUS | Status: AC | PRN
  Administered 2020-08-13: 250 [IU]
  Filled 2020-08-13: qty 5

## 2020-08-13 MED ORDER — SODIUM CHLORIDE 0.9 % IV SOLN
Freq: Once | INTRAVENOUS | Status: AC
  Filled 2020-08-13: qty 250

## 2020-08-13 MED ORDER — SODIUM CHLORIDE 0.9% FLUSH
10.0000 mL | INTRAVENOUS | Status: DC | PRN
  Administered 2020-08-13: 10 mL
  Filled 2020-08-13: qty 10

## 2020-08-13 MED ORDER — SODIUM CHLORIDE 0.9 % IV SOLN
375.0000 mg/m2 | Freq: Once | INTRAVENOUS | Status: AC
  Administered 2020-08-13: 700 mg via INTRAVENOUS
  Filled 2020-08-13: qty 50

## 2020-08-13 MED ORDER — DIPHENHYDRAMINE HCL 25 MG PO CAPS
50.0000 mg | ORAL_CAPSULE | Freq: Once | ORAL | Status: AC
  Administered 2020-08-13: 50 mg via ORAL

## 2020-08-13 MED ORDER — DIPHENHYDRAMINE HCL 25 MG PO CAPS
ORAL_CAPSULE | ORAL | Status: AC
  Filled 2020-08-13: qty 2

## 2020-08-13 NOTE — Patient Instructions (Signed)
[facility] Discharge Instructions for Patients Receiving Chemotherapy  Today you received the following chemotherapy agents Rituxan  If you develop any problems after your infusion please call the [facility]:  *FEVER GREATER THAN 100.5 F  *CHILLS WITH OR WITHOUT FEVER  NAUSEA AND VOMITING THAT IS NOT CONTROLLED WITH YOUR NAUSEA MEDICATION  *UNUSUAL SHORTNESS OF BREATH  *UNUSUAL BRUISING OR BLEEDING  TENDERNESS IN MOUTH AND THROAT WITH OR WITHOUT PRESENCE OF ULCERS  *URINARY PROBLEMS  *BOWEL PROBLEMS  UNUSUAL RASH Items with * indicate a potential emergency and should be followed up as soon as possible.  Feel free to call the clinic should you have any questions or concerns at The clinic phone number is [phone].  Please show the [place] at check-in to the Emergency Department and triage nurse.

## 2020-08-13 NOTE — Progress Notes (Signed)
  [facility] OFFICE PROGRESS NOTE   Diagnosis: TTP  INTERVAL HISTORY:   [name] returns as scheduled.  She completed week 2 rituximab 08/06/2020.  She reports feeling "fine".  No bleeding.  No fever.  No unusual headaches.  No visual disturbance.  No change in baseline balance issues which has been a chronic problem since back surgery.  Objective:  Vital signs in last 24 hours:  Blood pressure (!) 149/62, pulse 91, temperature 97.8 F (36.6 C), temperature source Tympanic, resp. rate 18, height 5\' 4"  (1.626 m), weight 161 lb 12.8 oz (73.4 kg), SpO2 98 %.    HEENT: No thrush or ulcers. Resp: Lungs clear bilaterally. Cardio: Regular rate and rhythm. GI: No hepatosplenomegaly. Vascular: Trace bilateral lower leg edema. Neuro: Alert and oriented.  Motor strength 5/5.    Lab Results:  Lab Results  Component Value Date   WBC 9.4 08/13/2020   HGB 13.5 08/13/2020   HCT 40.7 08/13/2020   MCV 97.6 08/13/2020   PLT 83 (L) 08/13/2020   NEUTROABS 6.6 08/13/2020    Imaging:  No results found.  Medications: I have reviewed the patient's current medications.  Assessment/Plan: 1. TTP diagnosed in 2005, treated with plasma exchange, steroids, and rituximab consolidation ? 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 Relapse of TTP March 2015-plasma exchange, steroids, rituximab, and maintenance azathioprine ? Admission with severe thrombocytopenia and elevated LDH 07/16/2020,ADAMTS13 activity less than 2%,consistent with relapse of TTP -Steroids/FFP given 07/16/2020 -Daily plasma exchange beginning 07/17/2020, 7 days, then qod starting 3/24, discharge 07/21/2020 on prednisone 60 mg daily       -Prednisone taper to 40 mg daily 07/29/2020       -rituximab 07/30/2020, 08/06/2020, 08/13/2020       -plasmapheresis on 07/31/2020, 08/03/2020, 08/05/2020, and 08/07/2020 2. History of epidural abscess requiring laminectomy 2012 3. Enterococcal sepsis and  endocarditis 2012 secondary to #2 4. 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 COPD  Disposition: [name] appears stable.  She has completed 2 cycles of weekly Rituxan.  Plan to proceed with week 3 today as scheduled.  We reviewed the CBC from today.  Platelet count is stable.  We will follow-up on the chemistry panel and LDH from today.  She will continue prednisone 40 mg daily.  She will return for lab, follow-up, Rituxan in 1 week.  She will contact the office in the interim with any problems.  Plan reviewed with Dr. Benay Spice.    [name] ANP/GNP-BC   08/13/2020  9:34 AM

## 2020-08-14 ENCOUNTER — Telehealth: Payer: Self-pay

## 2020-08-14 ENCOUNTER — Other Ambulatory Visit: Payer: Self-pay | Admitting: Nurse Practitioner

## 2020-08-14 DIAGNOSIS — M3119 Other thrombotic microangiopathy: Secondary | ICD-10-CM

## 2020-08-14 NOTE — Telephone Encounter (Signed)
-----   Message from [name], NP sent at 08/13/2020  4:54 PM EDT ----- Please let her know the LDH was a little higher today.  Dr. Benay Spice would like her to come in for repeat labs on 08/17/2020.

## 2020-08-14 NOTE — Telephone Encounter (Signed)
Pt aware of most recent labs and return appt for labs Monday

## 2020-08-14 NOTE — Telephone Encounter (Signed)
This nurse called spoke with nurse(maria, james) concerning flushing pts catheter advised to flush at time of use check and change dressing as needed with each visit call for any further advice or direction or any difficulty with cath [phone]

## 2020-08-17 ENCOUNTER — Telehealth: Payer: Self-pay

## 2020-08-17 ENCOUNTER — Inpatient Hospital Stay (HOSPITAL_BASED_OUTPATIENT_CLINIC_OR_DEPARTMENT_OTHER): Payer: Medicare Other | Admitting: Oncology

## 2020-08-17 ENCOUNTER — Other Ambulatory Visit: Payer: Self-pay

## 2020-08-17 ENCOUNTER — Telehealth: Payer: Self-pay | Admitting: *Deleted

## 2020-08-17 DIAGNOSIS — M3119 Other thrombotic microangiopathy: Secondary | ICD-10-CM

## 2020-08-17 DIAGNOSIS — Z452 Encounter for adjustment and management of vascular access device: Secondary | ICD-10-CM

## 2020-08-17 DIAGNOSIS — Z5112 Encounter for antineoplastic immunotherapy: Secondary | ICD-10-CM | POA: Diagnosis not present

## 2020-08-17 LAB — CBC WITH DIFFERENTIAL (CANCER CENTER ONLY)
Abs Immature Granulocytes: 0.25 10*3/uL — ABNORMAL HIGH (ref 0.00–0.07)
Basophils Absolute: 0 10*3/uL (ref 0.0–0.1)
Basophils Relative: 0 %
Eosinophils Absolute: 0 10*3/uL (ref 0.0–0.5)
Eosinophils Relative: 0 %
HCT: 42.8 % (ref 36.0–46.0)
Hemoglobin: 13.8 g/dL (ref 12.0–15.0)
Immature Granulocytes: 3 %
Lymphocytes Relative: 11 %
Lymphs Abs: 1 10*3/uL (ref 0.7–4.0)
MCH: 31.8 pg (ref 26.0–34.0)
MCHC: 32.2 g/dL (ref 30.0–36.0)
MCV: 98.6 fL (ref 80.0–100.0)
Monocytes Absolute: 0.5 10*3/uL (ref 0.1–1.0)
Monocytes Relative: 6 %
Neutro Abs: 7.1 10*3/uL (ref 1.7–7.7)
Neutrophils Relative %: 80 %
Platelet Count: 64 10*3/uL — ABNORMAL LOW (ref 150–400)
RBC: 4.34 MIL/uL (ref 3.87–5.11)
RDW: 15.5 % (ref 11.5–15.5)
WBC Count: 8.9 10*3/uL (ref 4.0–10.5)
nRBC: 0 % (ref 0.0–0.2)

## 2020-08-17 LAB — CMP (CANCER CENTER ONLY)
ALT: 35 U/L (ref 0–44)
AST: 27 U/L (ref 15–41)
Albumin: 3.9 g/dL (ref 3.5–5.0)
Alkaline Phosphatase: 67 U/L (ref 38–126)
Anion gap: 8 (ref 5–15)
BUN: 35 mg/dL — ABNORMAL HIGH (ref 8–23)
CO2: 28 mmol/L (ref 22–32)
Calcium: 9.4 mg/dL (ref 8.9–10.3)
Chloride: 102 mmol/L (ref 98–111)
Creatinine: 1.23 mg/dL — ABNORMAL HIGH (ref 0.44–1.00)
GFR, Estimated: 45 mL/min — ABNORMAL LOW (ref >60)
Glucose, Bld: 124 mg/dL — ABNORMAL HIGH (ref 70–99)
Potassium: 5.1 mmol/L (ref 3.5–5.1)
Sodium: 138 mmol/L (ref 135–145)
Total Bilirubin: 0.6 mg/dL (ref 0.3–1.2)
Total Protein: 6.2 g/dL — ABNORMAL LOW (ref 6.5–8.1)

## 2020-08-17 LAB — RETIC PANEL
Immature Retic Fract: 15.6 % (ref 2.3–15.9)
RBC.: 4.34 MIL/uL (ref 3.87–5.11)
Retic Count, Absolute: 141.5 10*3/uL (ref 19.0–186.0)
Retic Ct Pct: 3.3 % — ABNORMAL HIGH (ref 0.4–3.1)
Reticulocyte Hemoglobin: 35.5 pg (ref >27.9)

## 2020-08-17 LAB — LACTATE DEHYDROGENASE: LDH: 389 U/L — ABNORMAL HIGH (ref 98–192)

## 2020-08-17 MED ORDER — HEPARIN SOD (PORK) LOCK FLUSH 100 UNIT/ML IV SOLN
250.0000 [IU] | Freq: Once | INTRAVENOUS | Status: AC
Start: 2020-08-17 — End: 2020-08-17
  Administered 2020-08-17: 250 [IU] via INTRAVENOUS
  Filled 2020-08-17: qty 5

## 2020-08-17 MED ORDER — SODIUM CHLORIDE 0.9% FLUSH
10.0000 mL | Freq: Once | INTRAVENOUS | Status: AC
Start: 2020-08-17 — End: 2020-08-17
  Administered 2020-08-17: 10 mL via INTRAVENOUS
  Filled 2020-08-17: qty 10

## 2020-08-17 NOTE — Telephone Encounter (Signed)
Notified patient of lab results and to call for bleeding. She denies any bleeding. Per Dr. Benay Spice: Increase Prednisone back to 60 mg/day. F/U as scheduled on 08/20/20. She agrees to do so. MD requested add on lab of [id number]. Unable to add on; will collect on 08/20/20

## 2020-08-17 NOTE — Telephone Encounter (Signed)
Return T/C to Pt's daughter Cherre Robins who stated that her parents were not clear about lab results informed Pt's daughter that Pt's platelet count was 64 and per Dr Benay Spice Pt will increase prednisone to 60 mg a day. Pt will be seen on Thursday. Pt's daughter verbalized understanding.

## 2020-08-20 ENCOUNTER — Other Ambulatory Visit: Payer: Self-pay

## 2020-08-20 ENCOUNTER — Inpatient Hospital Stay (HOSPITAL_BASED_OUTPATIENT_CLINIC_OR_DEPARTMENT_OTHER): Payer: Medicare Other | Admitting: Oncology

## 2020-08-20 ENCOUNTER — Inpatient Hospital Stay: Payer: Medicare Other

## 2020-08-20 VITALS — BP 153/61 | HR 76 | Temp 98.9°F | Resp 20

## 2020-08-20 VITALS — BP 144/65 | HR 96 | Temp 97.8°F | Resp 20 | Ht 64.0 in | Wt 160.8 lb

## 2020-08-20 DIAGNOSIS — Z862 Personal history of diseases of the blood and blood-forming organs and certain disorders involving the immune mechanism: Secondary | ICD-10-CM

## 2020-08-20 DIAGNOSIS — M3119 Other thrombotic microangiopathy: Secondary | ICD-10-CM

## 2020-08-20 DIAGNOSIS — Z5112 Encounter for antineoplastic immunotherapy: Secondary | ICD-10-CM | POA: Diagnosis not present

## 2020-08-20 LAB — CMP (CANCER CENTER ONLY)
ALT: 41 U/L (ref 0–44)
AST: 30 U/L (ref 15–41)
Albumin: 3.8 g/dL (ref 3.5–5.0)
Alkaline Phosphatase: 63 U/L (ref 38–126)
Anion gap: 10 (ref 5–15)
BUN: 31 mg/dL — ABNORMAL HIGH (ref 8–23)
CO2: 25 mmol/L (ref 22–32)
Calcium: 9.2 mg/dL (ref 8.9–10.3)
Chloride: 103 mmol/L (ref 98–111)
Creatinine: 1.05 mg/dL — ABNORMAL HIGH (ref 0.44–1.00)
GFR, Estimated: 54 mL/min — ABNORMAL LOW (ref >60)
Glucose, Bld: 211 mg/dL — ABNORMAL HIGH (ref 70–99)
Potassium: 4.2 mmol/L (ref 3.5–5.1)
Sodium: 138 mmol/L (ref 135–145)
Total Bilirubin: 0.6 mg/dL (ref 0.3–1.2)
Total Protein: 6.1 g/dL — ABNORMAL LOW (ref 6.5–8.1)

## 2020-08-20 LAB — CBC WITH DIFFERENTIAL (CANCER CENTER ONLY)
Abs Immature Granulocytes: 0.25 10*3/uL — ABNORMAL HIGH (ref 0.00–0.07)
Basophils Absolute: 0 10*3/uL (ref 0.0–0.1)
Basophils Relative: 0 %
Eosinophils Absolute: 0 10*3/uL (ref 0.0–0.5)
Eosinophils Relative: 0 %
HCT: 41.9 % (ref 36.0–46.0)
Hemoglobin: 14 g/dL (ref 12.0–15.0)
Immature Granulocytes: 2 %
Lymphocytes Relative: 6 %
Lymphs Abs: 0.6 10*3/uL — ABNORMAL LOW (ref 0.7–4.0)
MCH: 32.3 pg (ref 26.0–34.0)
MCHC: 33.4 g/dL (ref 30.0–36.0)
MCV: 96.8 fL (ref 80.0–100.0)
Monocytes Absolute: 0.4 10*3/uL (ref 0.1–1.0)
Monocytes Relative: 4 %
Neutro Abs: 9 10*3/uL — ABNORMAL HIGH (ref 1.7–7.7)
Neutrophils Relative %: 88 %
Platelet Count: 63 10*3/uL — ABNORMAL LOW (ref 150–400)
RBC: 4.33 MIL/uL (ref 3.87–5.11)
RDW: 15.4 % (ref 11.5–15.5)
WBC Count: 10.4 10*3/uL (ref 4.0–10.5)
nRBC: 0 % (ref 0.0–0.2)

## 2020-08-20 LAB — LACTATE DEHYDROGENASE: LDH: 401 U/L — ABNORMAL HIGH (ref 98–192)

## 2020-08-20 MED ORDER — HEPARIN SOD (PORK) LOCK FLUSH 100 UNIT/ML IV SOLN
250.0000 [IU] | Freq: Once | INTRAVENOUS | Status: AC | PRN
  Administered 2020-08-20: 250 [IU]
  Filled 2020-08-20: qty 5

## 2020-08-20 MED ORDER — DIPHENHYDRAMINE HCL 25 MG PO CAPS
50.0000 mg | ORAL_CAPSULE | Freq: Once | ORAL | Status: AC
  Administered 2020-08-20: 50 mg via ORAL

## 2020-08-20 MED ORDER — SODIUM CHLORIDE 0.9 % IV SOLN
375.0000 mg/m2 | Freq: Once | INTRAVENOUS | Status: AC
  Administered 2020-08-20: 700 mg via INTRAVENOUS
  Filled 2020-08-20: qty 20

## 2020-08-20 MED ORDER — ACETAMINOPHEN 325 MG PO TABS
ORAL_TABLET | ORAL | Status: AC
  Filled 2020-08-20: qty 2

## 2020-08-20 MED ORDER — SODIUM CHLORIDE 0.9% FLUSH
10.0000 mL | INTRAVENOUS | Status: DC | PRN
  Administered 2020-08-20: 10 mL via INTRAVENOUS
  Filled 2020-08-20: qty 10

## 2020-08-20 MED ORDER — SODIUM CHLORIDE 0.9% FLUSH
10.0000 mL | INTRAVENOUS | Status: DC | PRN
  Administered 2020-08-20: 10 mL
  Filled 2020-08-20: qty 10

## 2020-08-20 MED ORDER — DIPHENHYDRAMINE HCL 25 MG PO CAPS
ORAL_CAPSULE | ORAL | Status: AC
  Filled 2020-08-20: qty 1

## 2020-08-20 MED ORDER — SODIUM CHLORIDE 0.9 % IV SOLN
Freq: Once | INTRAVENOUS | Status: AC
Start: 2020-08-20 — End: 2020-08-20
  Filled 2020-08-20: qty 250

## 2020-08-20 MED ORDER — ACETAMINOPHEN 325 MG PO TABS
650.0000 mg | ORAL_TABLET | Freq: Once | ORAL | Status: AC
  Administered 2020-08-20: 650 mg via ORAL

## 2020-08-20 NOTE — Progress Notes (Signed)
  [facility] OFFICE PROGRESS NOTE   Diagnosis: TTP  INTERVAL HISTORY:   [name] returns as scheduled.  She feels well.  No fever or neurologic symptoms.  No symptom of allergic reaction with rituximab.  She is now maintained on prednisone at a dose of 60 mg daily.  Objective:  Vital signs in last 24 hours:  Blood pressure (!) 144/65, pulse 96, temperature 97.8 F (36.6 C), temperature source Tympanic, resp. rate 20, height 5\' 4"  (1.626 m), weight 160 lb 12.8 oz (72.9 kg), SpO2 99 %.    HEENT: No thrush Resp: Scattered bilateral expiratory wheeze, no respiratory distress Cardio: Regular rate and rhythm, 2/6 diastolic murmur GI: No hepatosplenomegaly Vascular: No leg edema   Portacath/PICC-without erythema  Lab Results:  Lab Results  Component Value Date   WBC 10.4 08/20/2020   HGB 14.0 08/20/2020   HCT 41.9 08/20/2020   MCV 96.8 08/20/2020   PLT 63 (L) 08/20/2020   NEUTROABS 9.0 (H) 08/20/2020    CMP  Lab Results  Component Value Date   NA 138 08/17/2020   K 5.1 08/17/2020   CL 102 08/17/2020   CO2 28 08/17/2020   GLUCOSE 124 (H) 08/17/2020   BUN 35 (H) 08/17/2020   CREATININE 1.23 (H) 08/17/2020   CALCIUM 9.4 08/17/2020   PROT 6.2 (L) 08/17/2020   ALBUMIN 3.9 08/17/2020   AST 27 08/17/2020   ALT 35 08/17/2020   ALKPHOS 67 08/17/2020   BILITOT 0.6 08/17/2020   GFRNONAA 45 (L) 08/17/2020   GFRAA 50 (L) 11/25/2019     Medications: I have reviewed the patient's current medications.   Assessment/Plan: 1. TTP diagnosed in 2005, treated with plasma exchange, steroids, and rituximab consolidation ? 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 Relapse of TTP March 2015-plasma exchange, steroids, rituximab, and maintenance azathioprine ? Admission with severe thrombocytopenia and elevated LDH 07/16/2020,ADAMTS13 activity less than 2%,consistent with relapse of TTP -Steroids/FFP given 07/16/2020 -Daily plasma  exchange beginning 07/17/2020, 7 days, then qod starting 3/24, discharge 07/21/2020 on prednisone 60 mg daily       -Prednisone taper to 40 mg daily 07/29/2020       -rituximab 07/30/2020, 08/06/2020, 08/13/2020, 08/20/2020       -plasmapheresis on 07/31/2020, 08/03/2020, 08/05/2020, and 08/07/2020       -prednisone increased to 60 mg daily 08/17/2020 2. History of epidural abscess requiring laminectomy 2012 3. 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 COPD    Disposition: [name] appears stable.  The platelet count has been lower and the LDH higher over the past week to 10 days.  We increased the prednisone dose earlier this week.  She will complete week #4 of rituximab today.  I am concerned the TTP remains active.  We will resume plasmapheresis and add Caplacizumab if the platelet count is lower next week.  She will call for new symptoms.  [name] will return for an office visit on 08/24/2020.  Betsy Coder, MD  08/20/2020  8:59 AM

## 2020-08-20 NOTE — Patient Instructions (Signed)
[facility] Discharge Instructions for Patients Receiving Chemotherapy  Today you received the following chemotherapy agents - Rituxan  If you develop nausea and vomiting that is not controlled by your nausea medication, call the clinic.   BELOW ARE SYMPTOMS THAT SHOULD BE REPORTED IMMEDIATELY:  *FEVER GREATER THAN 100.5 F  *CHILLS WITH OR WITHOUT FEVER  NAUSEA AND VOMITING THAT IS NOT CONTROLLED WITH YOUR NAUSEA MEDICATION  *UNUSUAL SHORTNESS OF BREATH  *UNUSUAL BRUISING OR BLEEDING  TENDERNESS IN MOUTH AND THROAT WITH OR WITHOUT PRESENCE OF ULCERS  *URINARY PROBLEMS  *BOWEL PROBLEMS  UNUSUAL RASH Items with * indicate a potential emergency and should be followed up as soon as possible.  Feel free to call the clinic should you have any questions or concerns. The clinic phone number is [phone].  Please show the CHEMO ALERT CARD at check-in to the Emergency Department and triage nurse.   

## 2020-08-24 ENCOUNTER — Other Ambulatory Visit: Payer: Medicare Other

## 2020-08-24 ENCOUNTER — Other Ambulatory Visit: Payer: Self-pay

## 2020-08-24 ENCOUNTER — Inpatient Hospital Stay: Payer: Medicare Other

## 2020-08-24 ENCOUNTER — Inpatient Hospital Stay (HOSPITAL_BASED_OUTPATIENT_CLINIC_OR_DEPARTMENT_OTHER): Payer: Medicare Other | Admitting: Nurse Practitioner

## 2020-08-24 ENCOUNTER — Ambulatory Visit: Payer: Medicare Other | Admitting: Oncology

## 2020-08-24 ENCOUNTER — Non-Acute Institutional Stay (HOSPITAL_COMMUNITY)
Admission: RE | Admit: 2020-08-24 | Discharge: 2020-08-24 | Disposition: A | Discharge status: Still In Hospital | Payer: Medicare Other | Source: Other Acute Inpatient Hospital | Attending: Oncology | Admitting: Oncology

## 2020-08-24 VITALS — BP 156/67 | HR 67 | Temp 98.0°F | Resp 18 | Ht 64.0 in | Wt 161.2 lb

## 2020-08-24 DIAGNOSIS — M3119 Other thrombotic microangiopathy: Secondary | ICD-10-CM

## 2020-08-24 DIAGNOSIS — Z862 Personal history of diseases of the blood and blood-forming organs and certain disorders involving the immune mechanism: Secondary | ICD-10-CM

## 2020-08-24 DIAGNOSIS — Z5112 Encounter for antineoplastic immunotherapy: Secondary | ICD-10-CM | POA: Diagnosis not present

## 2020-08-24 LAB — CMP (CANCER CENTER ONLY)
ALT: 41 U/L (ref 0–44)
AST: 33 U/L (ref 15–41)
Albumin: 3.7 g/dL (ref 3.5–5.0)
Alkaline Phosphatase: 63 U/L (ref 38–126)
Anion gap: 7 (ref 5–15)
BUN: 32 mg/dL — ABNORMAL HIGH (ref 8–23)
CO2: 29 mmol/L (ref 22–32)
Calcium: 9.3 mg/dL (ref 8.9–10.3)
Chloride: 103 mmol/L (ref 98–111)
Creatinine: 1.14 mg/dL — ABNORMAL HIGH (ref 0.44–1.00)
GFR, Estimated: 49 mL/min — ABNORMAL LOW (ref >60)
Glucose, Bld: 107 mg/dL — ABNORMAL HIGH (ref 70–99)
Potassium: 5.4 mmol/L — ABNORMAL HIGH (ref 3.5–5.1)
Sodium: 139 mmol/L (ref 135–145)
Total Bilirubin: 0.7 mg/dL (ref 0.3–1.2)
Total Protein: 5.8 g/dL — ABNORMAL LOW (ref 6.5–8.1)

## 2020-08-24 LAB — RETIC PANEL
Immature Retic Fract: 23.2 % — ABNORMAL HIGH (ref 2.3–15.9)
RBC.: 4.3 MIL/uL (ref 3.87–5.11)
Retic Count, Absolute: 139.8 10*3/uL (ref 19.0–186.0)
Retic Ct Pct: 3.3 % — ABNORMAL HIGH (ref 0.4–3.1)
Reticulocyte Hemoglobin: 36.4 pg (ref >27.9)

## 2020-08-24 LAB — CBC WITH DIFFERENTIAL (CANCER CENTER ONLY)
Abs Immature Granulocytes: 0.65 10*3/uL — ABNORMAL HIGH (ref 0.00–0.07)
Basophils Absolute: 0.1 10*3/uL (ref 0.0–0.1)
Basophils Relative: 1 %
Eosinophils Absolute: 0 10*3/uL (ref 0.0–0.5)
Eosinophils Relative: 0 %
HCT: 43.6 % (ref 36.0–46.0)
Hemoglobin: 14.2 g/dL (ref 12.0–15.0)
Immature Granulocytes: 5 %
Lymphocytes Relative: 9 %
Lymphs Abs: 1.1 10*3/uL (ref 0.7–4.0)
MCH: 32.4 pg (ref 26.0–34.0)
MCHC: 32.6 g/dL (ref 30.0–36.0)
MCV: 99.5 fL (ref 80.0–100.0)
Monocytes Absolute: 1.1 10*3/uL — ABNORMAL HIGH (ref 0.1–1.0)
Monocytes Relative: 9 %
Neutro Abs: 9.1 10*3/uL — ABNORMAL HIGH (ref 1.7–7.7)
Neutrophils Relative %: 76 %
Platelet Count: 35 10*3/uL — ABNORMAL LOW (ref 150–400)
RBC: 4.38 MIL/uL (ref 3.87–5.11)
RDW: 15.4 % (ref 11.5–15.5)
WBC Count: 12 10*3/uL — ABNORMAL HIGH (ref 4.0–10.5)
nRBC: 0.2 % (ref 0.0–0.2)

## 2020-08-24 LAB — LACTATE DEHYDROGENASE: LDH: 528 U/L — ABNORMAL HIGH (ref 98–192)

## 2020-08-24 MED ORDER — DIPHENHYDRAMINE HCL 25 MG PO CAPS
ORAL_CAPSULE | ORAL | Status: AC
  Administered 2020-08-24: 25 mg via ORAL
  Filled 2020-08-24: qty 1

## 2020-08-24 MED ORDER — CAPLACIZUMAB-YHDP 11 MG IJ KIT: 11.0000 mg | PACK | Freq: Every day | INTRAMUSCULAR | Status: DC

## 2020-08-24 MED ORDER — ACD FORMULA A 0.73-2.45-2.2 GM/100ML VI SOLN: 1000.0000 mL | Status: DC

## 2020-08-24 MED ORDER — ACETAMINOPHEN 325 MG PO TABS
ORAL_TABLET | ORAL | Status: AC
  Administered 2020-08-24: 650 mg via ORAL
  Filled 2020-08-24: qty 2

## 2020-08-24 MED ORDER — ACD FORMULA A 0.73-2.45-2.2 GM/100ML VI SOLN
Status: AC
  Administered 2020-08-24: 1000 mL
  Filled 2020-08-24: qty 500

## 2020-08-24 MED ORDER — SODIUM CHLORIDE 0.9% FLUSH
10.0000 mL | Freq: Once | INTRAVENOUS | Status: AC
  Administered 2020-08-24: 10 mL via INTRAVENOUS
  Filled 2020-08-24: qty 10

## 2020-08-24 MED ORDER — CALCIUM GLUCONATE-NACL 2-0.675 GM/100ML-% IV SOLN
INTRAVENOUS | Status: AC
  Administered 2020-08-24: 2000 mg via INTRAVENOUS
  Filled 2020-08-24: qty 100

## 2020-08-24 MED ORDER — HEPARIN SOD (PORK) LOCK FLUSH 100 UNIT/ML IV SOLN
250.0000 [IU] | Freq: Once | INTRAVENOUS | Status: AC
  Administered 2020-08-24: 250 [IU] via INTRAVENOUS
  Filled 2020-08-24: qty 5

## 2020-08-24 MED ORDER — CAPLACIZUMAB-YHDP 11 MG IJ KIT
11.0000 mg | PACK | Freq: Once | INTRAMUSCULAR | Status: AC
  Administered 2020-08-24: 11 mg via SUBCUTANEOUS
  Filled 2020-08-24: qty 1

## 2020-08-24 MED ORDER — CAPLACIZUMAB (CABLIVI) IV SYRINGE
11.0000 mg | INJECTION | Freq: Once | INTRAMUSCULAR | Status: AC
Start: 2020-08-24 — End: 2020-08-24
  Administered 2020-08-24: 11 mg via INTRAVENOUS
  Filled 2020-08-24: qty 1

## 2020-08-24 MED ORDER — ANTICOAGULANT SODIUM CITRATE 4% (200MG/5ML) IV SOLN
5.0000 mL | Freq: Once | Status: AC
  Administered 2020-08-24: 5 mL
  Filled 2020-08-24: qty 5

## 2020-08-24 MED ORDER — CALCIUM GLUCONATE-NACL 2-0.675 GM/100ML-% IV SOLN: 2.0000 g | Freq: Once | INTRAVENOUS | Status: AC

## 2020-08-24 MED ORDER — CALCIUM CARBONATE ANTACID 500 MG PO CHEW: 2.0000 | CHEWABLE_TABLET | ORAL | Status: DC

## 2020-08-24 MED ORDER — CALCIUM CARBONATE ANTACID 500 MG PO CHEW
CHEWABLE_TABLET | ORAL | Status: AC
  Administered 2020-08-24: 400 mg via ORAL
  Filled 2020-08-24: qty 2

## 2020-08-24 MED ORDER — DIPHENHYDRAMINE HCL 25 MG PO CAPS: 25.0000 mg | ORAL_CAPSULE | Freq: Four times a day (QID) | ORAL | Status: DC | PRN

## 2020-08-24 MED ORDER — ACETAMINOPHEN 325 MG PO TABS: 650.0000 mg | ORAL_TABLET | ORAL | Status: DC | PRN

## 2020-08-24 NOTE — Progress Notes (Addendum)
[facility] OFFICE PROGRESS NOTE   Diagnosis: TTP  INTERVAL HISTORY:   [name] returns as scheduled.  Prednisone dose was increased to 60 mg daily beginning 08/17/2020.  She had bleeding when she "bit" her lip yesterday.  No other bleeding.  No unusual headaches.  No double vision.  Lines appear "smudged".  Objective:  Vital signs in last 24 hours:  Blood pressure (!) 156/67, pulse 67, temperature 98 F (36.7 C), temperature source Tympanic, resp. rate 18, height 5\' 4"  (1.626 m), weight 161 lb 3.2 oz (73.1 kg), SpO2 97 %.    HEENT: No thrush. Resp: Lungs clear bilaterally. Cardio: Regular rate and rhythm.  GI: Abdomen soft and nontender.  No hepatosplenomegaly. Vascular: No leg edema. Neuro: Alert and oriented. Skin: Small ecchymosis mid abdomen.  Petechiae scattered over the abdominal wall. Pheresis catheter without erythema.   Lab Results:  Lab Results  Component Value Date   WBC 10.4 08/20/2020   HGB 14.0 08/20/2020   HCT 41.9 08/20/2020   MCV 96.8 08/20/2020   PLT 63 (L) 08/20/2020   NEUTROABS 9.0 (H) 08/20/2020    Imaging:  No results found.  Medications: I have reviewed the patient's current medications.  Assessment/Plan: 1. TTP diagnosed in 2005, treated with plasma exchange, steroids, and rituximab consolidation ? 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 Relapse of TTP March 2015-plasma exchange, steroids, rituximab, and maintenance azathioprine ? Admission with severe thrombocytopenia and elevated LDH 07/16/2020,ADAMTS13 activity less than 2%,consistent with relapse of TTP -Steroids/FFP given 07/16/2020 -Daily plasma exchange beginning 07/17/2020, 7 days, then qod starting 3/24, discharge 07/21/2020 on prednisone 60 mg daily -Prednisone taper to 40 mg daily 07/29/2020 -rituximab 07/30/2020, 08/06/2020, 08/13/2020, 08/20/2020 -plasmapheresis on 07/31/2020, 08/03/2020, 08/05/2020, and 08/07/2020        -prednisone increased to 60 mg daily 08/17/2020  -Plasmapheresis 08/24/2020  -caplacizumab initiated 08/24/2020 2. History of epidural abscess requiring laminectomy 2012 3. 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 COPD  Disposition: [name] appears unchanged.  Review of the CBC shows the platelet count is lower, 35,000 today.  Hemoglobin remains in normal range.  ADAMTS13 activity from 08/20/2020 returned at 4.2%.  Dr. Benay Spice recommends resuming daily pheresis beginning today as well as beginning caplizumab.  She agrees to proceed. Potential side effects including allergic reaction, bleeding were reviewed.  [name] will continue prednisone 60 mg daily.  We will see her in follow-up here on 4/28 or 08/28/2020.  She will contact the office in the interim with any problems.  Patient seen with Dr. Benay Spice.    [name] ANP/GNP-BC   08/24/2020  10:31 AM  This was a shared visit with [name].  [name] has relapse of TTP despite rituximab and ongoing steroid use.  The platelet count is significantly lower today with elevation of the LDH and the ADAMTS13 activity from 08/20/2020 returned low.  The plan is to resume daily plasma exchange.  She will continue prednisone. Caplizumab will be added.  We discussed the plan with [name] and her daughter.  She agrees to proceed.  Arrangements were made for plasmapheresis to begin today.  We will arrange for daily labs as she undergoes plasmapheresis this week.  She will be scheduled for an office visit later this week.   We discussed additional potential salvage interventions including cyclophosphamide and splenectomy.  Orders were entered for plasmapheresis and caplizumab.  I was present for  greater than 50% of today's visit.  I perform medical decision making.  Julieanne Manson, MD

## 2020-08-24 NOTE — Progress Notes (Signed)
Therapeutic Plasma Exchange complete. Tolerated well. Patient departed unit via wheelchair by this RN  to a waiting car, vitals stable alert oriented w/o complaint.

## 2020-08-25 ENCOUNTER — Telehealth: Payer: Self-pay

## 2020-08-25 ENCOUNTER — Non-Acute Institutional Stay (HOSPITAL_COMMUNITY)
Admission: RE | Admit: 2020-08-25 | Discharge: 2020-08-25 | Disposition: A | Discharge status: Home / Self Care | Payer: Medicare Other | Source: Ambulatory Visit | Attending: Oncology | Admitting: Oncology

## 2020-08-25 DIAGNOSIS — M3119 Other thrombotic microangiopathy: Secondary | ICD-10-CM | POA: Diagnosis present

## 2020-08-25 LAB — THERAPEUTIC PLASMA EXCHANGE (BLOOD BANK)
Plasma volume needed: 2204
Unit division: 0
Unit division: 0
Unit division: 0
Unit division: 0
Unit division: 0
Unit division: 0
Unit division: 0

## 2020-08-25 LAB — ADAMTS13 ANTIBODY: ADAMTS13 Antibody: 25 Units/mL — ABNORMAL HIGH (ref <12)

## 2020-08-25 LAB — CBC
HCT: 39 % (ref 36.0–46.0)
Hemoglobin: 12.5 g/dL (ref 12.0–15.0)
MCH: 32.4 pg (ref 26.0–34.0)
MCHC: 32.1 g/dL (ref 30.0–36.0)
MCV: 101 fL — ABNORMAL HIGH (ref 80.0–100.0)
Platelets: 55 10*3/uL — ABNORMAL LOW (ref 150–400)
RBC: 3.86 MIL/uL — ABNORMAL LOW (ref 3.87–5.11)
RDW: 15.4 % (ref 11.5–15.5)
WBC: 10.2 10*3/uL (ref 4.0–10.5)
nRBC: 0 % (ref 0.0–0.2)

## 2020-08-25 LAB — ADAMTS13 ACTIVITY: Adamts 13 Activity: 4.2 % — CL (ref >66.8)

## 2020-08-25 LAB — BASIC METABOLIC PANEL
Anion gap: 8 (ref 5–15)
BUN: 30 mg/dL — ABNORMAL HIGH (ref 8–23)
CO2: 30 mmol/L (ref 22–32)
Calcium: 9.2 mg/dL (ref 8.9–10.3)
Chloride: 102 mmol/L (ref 98–111)
Creatinine, Ser: 1.41 mg/dL — ABNORMAL HIGH (ref 0.44–1.00)
GFR, Estimated: 38 mL/min — ABNORMAL LOW (ref >60)
Glucose, Bld: 104 mg/dL — ABNORMAL HIGH (ref 70–99)
Potassium: 4.6 mmol/L (ref 3.5–5.1)
Sodium: 140 mmol/L (ref 135–145)

## 2020-08-25 LAB — LACTATE DEHYDROGENASE: LDH: 187 U/L (ref 98–192)

## 2020-08-25 MED ORDER — DIPHENHYDRAMINE HCL 25 MG PO CAPS: 25.0000 mg | ORAL_CAPSULE | Freq: Four times a day (QID) | ORAL | Status: DC | PRN

## 2020-08-25 MED ORDER — ACETAMINOPHEN 325 MG PO TABS
ORAL_TABLET | ORAL | Status: AC
  Administered 2020-08-25: 650 mg via ORAL
  Filled 2020-08-25: qty 2

## 2020-08-25 MED ORDER — ACD FORMULA A 0.73-2.45-2.2 GM/100ML VI SOLN
Status: AC
  Filled 2020-08-25: qty 500

## 2020-08-25 MED ORDER — ACETAMINOPHEN 325 MG PO TABS: 650.0000 mg | ORAL_TABLET | ORAL | Status: DC | PRN

## 2020-08-25 MED ORDER — ANTICOAGULANT SODIUM CITRATE 4% (200MG/5ML) IV SOLN
5.0000 mL | Freq: Once | Status: AC
  Administered 2020-08-25: 5 mL
  Filled 2020-08-25: qty 5

## 2020-08-25 MED ORDER — CALCIUM CARBONATE ANTACID 500 MG PO CHEW: 2.0000 | CHEWABLE_TABLET | ORAL | Status: DC

## 2020-08-25 MED ORDER — CAPLACIZUMAB-YHDP 11 MG IJ KIT
11.0000 mg | PACK | Freq: Every day | INTRAMUSCULAR | Status: DC
  Administered 2020-08-25: 11 mg via SUBCUTANEOUS
  Filled 2020-08-25: qty 1

## 2020-08-25 MED ORDER — DIPHENHYDRAMINE HCL 25 MG PO CAPS
ORAL_CAPSULE | ORAL | Status: AC
  Administered 2020-08-25: 25 mg via ORAL
  Filled 2020-08-25: qty 1

## 2020-08-25 MED ORDER — CALCIUM CARBONATE ANTACID 500 MG PO CHEW
CHEWABLE_TABLET | ORAL | Status: AC
  Administered 2020-08-25: 400 mg via ORAL
  Filled 2020-08-25: qty 2

## 2020-08-25 MED ORDER — CALCIUM GLUCONATE-NACL 2-0.675 GM/100ML-% IV SOLN
INTRAVENOUS | Status: AC
  Administered 2020-08-25: 2000 mg via INTRAVENOUS
  Filled 2020-08-25: qty 100

## 2020-08-25 MED ORDER — CALCIUM GLUCONATE-NACL 2-0.675 GM/100ML-% IV SOLN: 2.0000 g | Freq: Once | INTRAVENOUS | Status: AC

## 2020-08-25 MED ORDER — ACD FORMULA A 0.73-2.45-2.2 GM/100ML VI SOLN: 1000.0000 mL | Status: DC

## 2020-08-25 NOTE — Progress Notes (Signed)
Therapeutic Plasma Exchange. Patient departed unit via wheelchair by this RN to a waiting car alert, oriented vital stable w/o complaint. Patient is aware to come back tomorrow by 8am.

## 2020-08-25 NOTE — Progress Notes (Signed)
Per provider I did call dialysis unit and give the order for daily cbc cmp ldh and fibrogen on 08/27/20 orders were received no concerns or questions voiced  [phone] pt unit)

## 2020-08-25 NOTE — Telephone Encounter (Signed)
Spoke with nurse taking care of pt ,Magda Paganini confirmed requested labs were conpleted CBC CMP LDH and sent to lab no results returned [phone]

## 2020-08-26 ENCOUNTER — Non-Acute Institutional Stay (HOSPITAL_COMMUNITY)
Admission: RE | Admit: 2020-08-26 | Discharge: 2020-08-26 | Disposition: A | Discharge status: Home / Self Care | Payer: Medicare Other | Source: Ambulatory Visit | Attending: Oncology | Admitting: Oncology

## 2020-08-26 DIAGNOSIS — M3119 Other thrombotic microangiopathy: Secondary | ICD-10-CM

## 2020-08-26 LAB — THERAPEUTIC PLASMA EXCHANGE (BLOOD BANK)
Plasma Exchange: 2203
Plasma volume needed: 2200
Unit division: 0
Unit division: 0
Unit division: 0
Unit division: 0
Unit division: 0

## 2020-08-26 LAB — BASIC METABOLIC PANEL
Anion gap: 10 (ref 5–15)
BUN: 32 mg/dL — ABNORMAL HIGH (ref 8–23)
CO2: 30 mmol/L (ref 22–32)
Calcium: 8.8 mg/dL — ABNORMAL LOW (ref 8.9–10.3)
Chloride: 101 mmol/L (ref 98–111)
Creatinine, Ser: 1.2 mg/dL — ABNORMAL HIGH (ref 0.44–1.00)
GFR, Estimated: 46 mL/min — ABNORMAL LOW (ref >60)
Glucose, Bld: 140 mg/dL — ABNORMAL HIGH (ref 70–99)
Potassium: 4.4 mmol/L (ref 3.5–5.1)
Sodium: 141 mmol/L (ref 135–145)

## 2020-08-26 LAB — CBC
HCT: 41.9 % (ref 36.0–46.0)
Hemoglobin: 13.6 g/dL (ref 12.0–15.0)
MCH: 33 pg (ref 26.0–34.0)
MCHC: 32.5 g/dL (ref 30.0–36.0)
MCV: 101.7 fL — ABNORMAL HIGH (ref 80.0–100.0)
Platelets: 83 10*3/uL — ABNORMAL LOW (ref 150–400)
RBC: 4.12 MIL/uL (ref 3.87–5.11)
RDW: 15.5 % (ref 11.5–15.5)
WBC: 10.3 10*3/uL (ref 4.0–10.5)
nRBC: 0 % (ref 0.0–0.2)

## 2020-08-26 LAB — LACTATE DEHYDROGENASE: LDH: 288 U/L — ABNORMAL HIGH (ref 98–192)

## 2020-08-26 MED ORDER — CAPLACIZUMAB-YHDP 11 MG IJ KIT
11.0000 mg | PACK | Freq: Every day | INTRAMUSCULAR | Status: DC
Start: 2020-08-26 — End: 2020-08-27
  Administered 2020-08-26: 11 mg via SUBCUTANEOUS
  Filled 2020-08-26: qty 1

## 2020-08-26 MED ORDER — DIPHENHYDRAMINE HCL 25 MG PO CAPS: 25.0000 mg | ORAL_CAPSULE | Freq: Four times a day (QID) | ORAL | Status: DC | PRN

## 2020-08-26 MED ORDER — CALCIUM GLUCONATE-NACL 2-0.675 GM/100ML-% IV SOLN
INTRAVENOUS | Status: AC
  Administered 2020-08-26: 2000 mg via INTRAVENOUS
  Filled 2020-08-26: qty 100

## 2020-08-26 MED ORDER — ACETAMINOPHEN 325 MG PO TABS: 650.0000 mg | ORAL_TABLET | ORAL | Status: DC | PRN

## 2020-08-26 MED ORDER — ACETAMINOPHEN 325 MG PO TABS
ORAL_TABLET | ORAL | Status: AC
  Administered 2020-08-26: 650 mg via ORAL
  Filled 2020-08-26: qty 2

## 2020-08-26 MED ORDER — DIPHENHYDRAMINE HCL 25 MG PO CAPS
ORAL_CAPSULE | ORAL | Status: AC
  Administered 2020-08-26: 25 mg via ORAL
  Filled 2020-08-26: qty 1

## 2020-08-26 MED ORDER — ANTICOAGULANT SODIUM CITRATE 4% (200MG/5ML) IV SOLN
5.0000 mL | Freq: Once | Status: DC
  Filled 2020-08-26: qty 5

## 2020-08-26 MED ORDER — ACD FORMULA A 0.73-2.45-2.2 GM/100ML VI SOLN: 1000.0000 mL | Status: DC

## 2020-08-26 MED ORDER — CALCIUM CARBONATE ANTACID 500 MG PO CHEW
CHEWABLE_TABLET | ORAL | Status: AC
  Filled 2020-08-26: qty 4

## 2020-08-26 MED ORDER — ACD FORMULA A 0.73-2.45-2.2 GM/100ML VI SOLN
Status: AC
  Administered 2020-08-26: 1000 mL
  Filled 2020-08-26: qty 500

## 2020-08-26 MED ORDER — CALCIUM CARBONATE ANTACID 500 MG PO CHEW: 2.0000 | CHEWABLE_TABLET | ORAL | Status: DC

## 2020-08-26 MED ORDER — CALCIUM GLUCONATE-NACL 2-0.675 GM/100ML-% IV SOLN: 2.0000 g | Freq: Once | INTRAVENOUS | Status: AC

## 2020-08-26 NOTE — Progress Notes (Signed)
Pt tx completed; pt denies any pain, n/v, dizziness, pain and paresthesia or weak. Pt escorted to the main entrance to her awaiting husband by Bevelyn Ngo, Tech.

## 2020-08-27 ENCOUNTER — Non-Acute Institutional Stay (HOSPITAL_COMMUNITY)
Admission: RE | Admit: 2020-08-27 | Discharge: 2020-08-27 | Disposition: A | Discharge status: Home / Self Care | Payer: Medicare Other | Source: Ambulatory Visit | Attending: Oncology | Admitting: Oncology

## 2020-08-27 DIAGNOSIS — M3119 Other thrombotic microangiopathy: Secondary | ICD-10-CM | POA: Diagnosis present

## 2020-08-27 LAB — CBC
HCT: 40.5 % (ref 36.0–46.0)
Hemoglobin: 13.1 g/dL (ref 12.0–15.0)
MCH: 32.9 pg (ref 26.0–34.0)
MCHC: 32.3 g/dL (ref 30.0–36.0)
MCV: 101.8 fL — ABNORMAL HIGH (ref 80.0–100.0)
Platelets: 99 10*3/uL — ABNORMAL LOW (ref 150–400)
RBC: 3.98 MIL/uL (ref 3.87–5.11)
RDW: 15.3 % (ref 11.5–15.5)
WBC: 11.4 10*3/uL — ABNORMAL HIGH (ref 4.0–10.5)
nRBC: 0.2 % (ref 0.0–0.2)

## 2020-08-27 LAB — THERAPEUTIC PLASMA EXCHANGE (BLOOD BANK)
Plasma Exchange: 2202
Plasma volume needed: 2202
Unit division: 0
Unit division: 0
Unit division: 0
Unit division: 0
Unit division: 0

## 2020-08-27 LAB — BASIC METABOLIC PANEL
Anion gap: 7 (ref 5–15)
BUN: 32 mg/dL — ABNORMAL HIGH (ref 8–23)
CO2: 33 mmol/L — ABNORMAL HIGH (ref 22–32)
Calcium: 8.8 mg/dL — ABNORMAL LOW (ref 8.9–10.3)
Chloride: 101 mmol/L (ref 98–111)
Creatinine, Ser: 1.18 mg/dL — ABNORMAL HIGH (ref 0.44–1.00)
GFR, Estimated: 47 mL/min — ABNORMAL LOW (ref >60)
Glucose, Bld: 117 mg/dL — ABNORMAL HIGH (ref 70–99)
Potassium: 4.3 mmol/L (ref 3.5–5.1)
Sodium: 141 mmol/L (ref 135–145)

## 2020-08-27 LAB — FIBRINOGEN: Fibrinogen: 210 mg/dL (ref 210–475)

## 2020-08-27 MED ORDER — CALCIUM CARBONATE ANTACID 500 MG PO CHEW: 2.0000 | CHEWABLE_TABLET | ORAL | Status: DC

## 2020-08-27 MED ORDER — CALCIUM GLUCONATE-NACL 2-0.675 GM/100ML-% IV SOLN: 2.0000 g | Freq: Once | INTRAVENOUS | Status: AC

## 2020-08-27 MED ORDER — DIPHENHYDRAMINE HCL 25 MG PO CAPS
ORAL_CAPSULE | ORAL | Status: AC
  Administered 2020-08-27: 25 mg via ORAL
  Filled 2020-08-27: qty 1

## 2020-08-27 MED ORDER — CALCIUM GLUCONATE-NACL 2-0.675 GM/100ML-% IV SOLN
INTRAVENOUS | Status: AC
  Administered 2020-08-27: 2000 mg via INTRAVENOUS
  Filled 2020-08-27: qty 100

## 2020-08-27 MED ORDER — ACETAMINOPHEN 325 MG PO TABS: 650.0000 mg | ORAL_TABLET | ORAL | Status: DC | PRN

## 2020-08-27 MED ORDER — CAPLACIZUMAB-YHDP 11 MG IJ KIT
11.0000 mg | PACK | Freq: Once | INTRAMUSCULAR | Status: DC
  Filled 2020-08-27: qty 1

## 2020-08-27 MED ORDER — ACD FORMULA A 0.73-2.45-2.2 GM/100ML VI SOLN
Status: AC
  Administered 2020-08-27: 1000 mL
  Filled 2020-08-27: qty 500

## 2020-08-27 MED ORDER — ANTICOAGULANT SODIUM CITRATE 4% (200MG/5ML) IV SOLN
5.0000 mL | Freq: Once | Status: DC
  Filled 2020-08-27: qty 5

## 2020-08-27 MED ORDER — ACETAMINOPHEN 325 MG PO TABS
ORAL_TABLET | ORAL | Status: AC
  Administered 2020-08-27: 650 mg via ORAL
  Filled 2020-08-27: qty 2

## 2020-08-27 MED ORDER — ACD FORMULA A 0.73-2.45-2.2 GM/100ML VI SOLN: 1000.0000 mL | Status: DC

## 2020-08-27 MED ORDER — DIPHENHYDRAMINE HCL 25 MG PO CAPS: 25.0000 mg | ORAL_CAPSULE | Freq: Four times a day (QID) | ORAL | Status: DC | PRN

## 2020-08-27 MED ORDER — CALCIUM CARBONATE ANTACID 500 MG PO CHEW
CHEWABLE_TABLET | ORAL | Status: AC
  Administered 2020-08-27: 400 mg via ORAL
  Filled 2020-08-27: qty 4

## 2020-08-27 NOTE — Progress Notes (Signed)
tx completed without complications; pt denies pain, n/v, SOB, weakness, or paresthesia. I accompanied the patient to the main entrance to her waiting husband.

## 2020-08-28 ENCOUNTER — Other Ambulatory Visit: Payer: Self-pay | Admitting: Nurse Practitioner

## 2020-08-28 ENCOUNTER — Non-Acute Institutional Stay (HOSPITAL_COMMUNITY)
Admission: RE | Admit: 2020-08-28 | Discharge: 2020-08-28 | Disposition: A | Discharge status: Home / Self Care | Payer: Medicare Other | Source: Ambulatory Visit | Attending: Oncology | Admitting: Oncology

## 2020-08-28 ENCOUNTER — Other Ambulatory Visit: Payer: Self-pay | Admitting: Pharmacist

## 2020-08-28 ENCOUNTER — Other Ambulatory Visit (HOSPITAL_COMMUNITY): Payer: Self-pay

## 2020-08-28 ENCOUNTER — Inpatient Hospital Stay (HOSPITAL_BASED_OUTPATIENT_CLINIC_OR_DEPARTMENT_OTHER): Payer: Medicare Other | Admitting: Nurse Practitioner

## 2020-08-28 ENCOUNTER — Ambulatory Visit: Payer: Medicare Other | Admitting: Nurse Practitioner

## 2020-08-28 DIAGNOSIS — Z5112 Encounter for antineoplastic immunotherapy: Secondary | ICD-10-CM | POA: Diagnosis not present

## 2020-08-28 DIAGNOSIS — M3119 Other thrombotic microangiopathy: Secondary | ICD-10-CM | POA: Diagnosis present

## 2020-08-28 LAB — THERAPEUTIC PLASMA EXCHANGE (BLOOD BANK)
Plasma Exchange: 2200
Plasma volume needed: 2200
Unit division: 0
Unit division: 0
Unit division: 0
Unit division: 0

## 2020-08-28 LAB — LACTATE DEHYDROGENASE: LDH: 222 U/L — ABNORMAL HIGH (ref 98–192)

## 2020-08-28 LAB — BASIC METABOLIC PANEL
Anion gap: 9 (ref 5–15)
BUN: 31 mg/dL — ABNORMAL HIGH (ref 8–23)
CO2: 33 mmol/L — ABNORMAL HIGH (ref 22–32)
Calcium: 8.5 mg/dL — ABNORMAL LOW (ref 8.9–10.3)
Chloride: 97 mmol/L — ABNORMAL LOW (ref 98–111)
Creatinine, Ser: 1.02 mg/dL — ABNORMAL HIGH (ref 0.44–1.00)
GFR, Estimated: 56 mL/min — ABNORMAL LOW (ref >60)
Glucose, Bld: 87 mg/dL (ref 70–99)
Potassium: 4.4 mmol/L (ref 3.5–5.1)
Sodium: 139 mmol/L (ref 135–145)

## 2020-08-28 LAB — CBC
HCT: 41.4 % (ref 36.0–46.0)
Hemoglobin: 13.1 g/dL (ref 12.0–15.0)
MCH: 32 pg (ref 26.0–34.0)
MCHC: 31.6 g/dL (ref 30.0–36.0)
MCV: 101.2 fL — ABNORMAL HIGH (ref 80.0–100.0)
Platelets: 127 10*3/uL — ABNORMAL LOW (ref 150–400)
RBC: 4.09 MIL/uL (ref 3.87–5.11)
RDW: 15.2 % (ref 11.5–15.5)
WBC: 11.5 10*3/uL — ABNORMAL HIGH (ref 4.0–10.5)
nRBC: 0 % (ref 0.0–0.2)

## 2020-08-28 LAB — FIBRINOGEN: Fibrinogen: 199 mg/dL — ABNORMAL LOW (ref 210–475)

## 2020-08-28 MED ORDER — ANTICOAGULANT SODIUM CITRATE 4% (200MG/5ML) IV SOLN
5.0000 mL | Freq: Once | Status: AC
  Administered 2020-08-28: 5 mL
  Filled 2020-08-28: qty 5

## 2020-08-28 MED ORDER — CALCIUM GLUCONATE-NACL 2-0.675 GM/100ML-% IV SOLN
INTRAVENOUS | Status: AC
  Administered 2020-08-28: 2000 mg via INTRAVENOUS
  Filled 2020-08-28: qty 100

## 2020-08-28 MED ORDER — ACD FORMULA A 0.73-2.45-2.2 GM/100ML VI SOLN
Status: AC
  Administered 2020-08-28: 1000 mL
  Filled 2020-08-28: qty 500

## 2020-08-28 MED ORDER — ACD FORMULA A 0.73-2.45-2.2 GM/100ML VI SOLN: 1000.0000 mL | Status: DC

## 2020-08-28 MED ORDER — CALCIUM GLUCONATE-NACL 2-0.675 GM/100ML-% IV SOLN: 2.0000 g | Freq: Once | INTRAVENOUS | Status: AC

## 2020-08-28 MED ORDER — CAPLACIZUMAB-YHDP 11 MG IJ KIT: 11.0000 mg | PACK | Freq: Every day | INTRAMUSCULAR | 0 refills | Status: DC

## 2020-08-28 MED ORDER — ACETAMINOPHEN 325 MG PO TABS: 650.0000 mg | ORAL_TABLET | ORAL | Status: DC | PRN

## 2020-08-28 MED ORDER — CAPLACIZUMAB-YHDP 11 MG IJ KIT
11.0000 mg | PACK | Freq: Once | INTRAMUSCULAR | Status: AC
  Administered 2020-08-28: 11 mg via SUBCUTANEOUS
  Filled 2020-08-28: qty 1

## 2020-08-28 MED ORDER — DIPHENHYDRAMINE HCL 25 MG PO CAPS
ORAL_CAPSULE | ORAL | Status: AC
  Administered 2020-08-28: 25 mg via ORAL
  Filled 2020-08-28: qty 1

## 2020-08-28 MED ORDER — DIPHENHYDRAMINE HCL 25 MG PO CAPS: 25.0000 mg | ORAL_CAPSULE | Freq: Four times a day (QID) | ORAL | Status: DC | PRN

## 2020-08-28 MED ORDER — CALCIUM CARBONATE ANTACID 500 MG PO CHEW
CHEWABLE_TABLET | ORAL | Status: AC
  Administered 2020-08-28: 400 mg via ORAL
  Filled 2020-08-28: qty 2

## 2020-08-28 MED ORDER — ACETAMINOPHEN 325 MG PO TABS
ORAL_TABLET | ORAL | Status: AC
  Administered 2020-08-28: 650 mg via ORAL
  Filled 2020-08-28: qty 2

## 2020-08-28 MED ORDER — CAPLACIZUMAB-YHDP 11 MG IJ KIT
11.0000 mg | PACK | Freq: Every day | INTRAMUSCULAR | 0 refills | Status: DC
  Filled 2020-08-28: qty 28, 28d supply, fill #0

## 2020-08-28 MED ORDER — CALCIUM CARBONATE ANTACID 500 MG PO CHEW: 2.0000 | CHEWABLE_TABLET | ORAL | Status: AC

## 2020-08-28 NOTE — Progress Notes (Signed)
Oral Oncology Pharmacist Encounter  Given limited distribution nature of caplacizumab injection, prescription redirected to [facility] by [employer] for processing and dispensing. Patient information and insurance shared with pharmacy for processing.  Leron Croak, PharmD, BCPS Hematology/Oncology Clinical Pharmacist [facility] [phone] 08/28/2020 3:14 PM

## 2020-08-28 NOTE — Progress Notes (Signed)
Therapeutic Plasma Exchange complete. Patient departed unit alert oriented vitals stable w/o complaint via wheelchair by this RN to a waiting car.

## 2020-08-28 NOTE — Progress Notes (Addendum)
[facility] OFFICE PROGRESS NOTE   Diagnosis: TTP  INTERVAL HISTORY:   Ms. Chou returns as scheduled.  Platelet count returned lower, 35,000, 08/24/2020.  Daily pheresis reinitiated.  Caplacizumab initiated.  No signs of allergic reaction.  No rash.  She denies bleeding.  No unusual headaches.  No diplopia.  No change in baseline gait/balance.  No fever.  She denies nausea/vomiting.  No diarrhea.  She reports a good appetite.  Objective:  Vital signs in last 24 hours:  Blood pressure (!) 151/62, pulse 94, temperature 98 F (36.7 C), temperature source Oral, resp. rate 18, height 5\' 4"  (1.626 m), weight 164 lb 6.4 oz (74.6 kg), SpO2 95 %.    HEENT: No thrush.  No bleeding in oral cavity. Resp: Lungs clear bilaterally. Cardio: Regular rate and rhythm. GI: Abdomen soft and nontender.  No hepatosplenomegaly. Vascular: Trace lower leg edema bilaterally. Neuro: Alert and oriented.  Follows commands. Skin: No rash. Pheresis catheter without erythema.  Lab Results:  Lab Results  Component Value Date   WBC 11.5 (H) 08/28/2020   HGB 13.1 08/28/2020   HCT 41.4 08/28/2020   MCV 101.2 (H) 08/28/2020   PLT 127 (L) 08/28/2020   NEUTROABS 9.1 (H) 08/24/2020    Imaging:  No results found.  Medications: I have reviewed the patient's current medications.  Assessment/Plan: 1. TTP diagnosed in 2005, treated with plasma exchange, steroids, and rituximab consolidation ? 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 Relapse of TTP March 2015-plasma exchange, steroids, rituximab, and maintenance azathioprine ? Admission with severe thrombocytopenia and elevated LDH 07/16/2020, ADAMTS13 activity less than 2%,consistent with relapse of TTP -Steroids/FFP given 07/16/2020 -Daily plasma exchange beginning 07/17/2020, 7 days, then qod starting 3/24, discharge 07/21/2020 on prednisone 60 mg daily -Prednisone taper to 40 mg daily  07/29/2020 -rituximab 07/30/2020, 08/06/2020, 08/13/2020, 08/20/2020 -plasmapheresis on 07/31/2020, 08/03/2020, 08/05/2020, and 08/07/2020 -prednisone increased to 60 mg daily 08/17/2020       -Plasmapheresis 08/24/2020, 08/25/2020, 08/26/2020, 08/27/2020, 08/28/2020, 08/29/2020       -caplacizumab initiated 08/24/2020  -Decrease prednisone to 40 mg daily 2. History of epidural abscess requiring laminectomy 2012 3. 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 COPD   Disposition: Ms. Lysne appears stable.  Plasmapheresis was resumed on 08/24/2020 due to relapse of TTP despite rituximab and ongoing steroids.  Caplacizumab initiated 08/24/2020.  We again reviewed potential toxicities associated with Caplacizumab.  She agrees to proceed.  Plan for plasmapheresis tomorrow, 08/29/2020, then begin a Monday Wednesday Friday schedule on 08/31/2020.  Continue daily Caplacizumab, plan for home administration when she is not receiving plasmapheresis.  Decrease prednisone to 40 mg daily.  We will see her in follow-up on 09/03/2020.  I contacted Ms. Toman's daughter by phone after the office visit today and reviewed the above.  Patient seen with Dr. Benay Spice.    [name] ANP/GNP-BC   08/28/2020  2:27 PM This was a shared visit with [name].  Ms. Rutten appears to be tolerating the plasmapheresis and caplacizumab well.  The platelet count and LDH have improved.  The plan is to continue caplacizumab.  Plasmapheresis will be changed to an every other day schedule beginning on 08/29/2020.  We are arranging for home caplacizumab therapy to begin at the completion of plasmapheresis.  She will continue a prednisone taper.  She will be scheduled for an office visit next week.  I was present for greater than  50% of today's visit.  I performed medical decision making.  Julieanne Manson, MD

## 2020-08-29 ENCOUNTER — Non-Acute Institutional Stay (HOSPITAL_COMMUNITY)
Admission: RE | Admit: 2020-08-29 | Discharge: 2020-08-29 | Disposition: A | Discharge status: Home / Self Care | Payer: Medicare Other | Source: Intra-hospital | Attending: Oncology | Admitting: Oncology

## 2020-08-29 DIAGNOSIS — M3119 Other thrombotic microangiopathy: Secondary | ICD-10-CM | POA: Diagnosis present

## 2020-08-29 LAB — THERAPEUTIC PLASMA EXCHANGE (BLOOD BANK)
Plasma Exchange: 2345
Plasma volume needed: 2345
Unit division: 0
Unit division: 0
Unit division: 0
Unit division: 0
Unit division: 0

## 2020-08-29 LAB — CBC
HCT: 41.7 % (ref 36.0–46.0)
Hemoglobin: 13.5 g/dL (ref 12.0–15.0)
MCH: 32.8 pg (ref 26.0–34.0)
MCHC: 32.4 g/dL (ref 30.0–36.0)
MCV: 101.2 fL — ABNORMAL HIGH (ref 80.0–100.0)
Platelets: 150 10*3/uL (ref 150–400)
RBC: 4.12 MIL/uL (ref 3.87–5.11)
RDW: 15.4 % (ref 11.5–15.5)
WBC: 13.2 10*3/uL — ABNORMAL HIGH (ref 4.0–10.5)
nRBC: 0 % (ref 0.0–0.2)

## 2020-08-29 LAB — BASIC METABOLIC PANEL
Anion gap: 9 (ref 5–15)
BUN: 38 mg/dL — ABNORMAL HIGH (ref 8–23)
CO2: 31 mmol/L (ref 22–32)
Calcium: 8.9 mg/dL (ref 8.9–10.3)
Chloride: 100 mmol/L (ref 98–111)
Creatinine, Ser: 1.15 mg/dL — ABNORMAL HIGH (ref 0.44–1.00)
GFR, Estimated: 49 mL/min — ABNORMAL LOW (ref >60)
Glucose, Bld: 119 mg/dL — ABNORMAL HIGH (ref 70–99)
Potassium: 3.9 mmol/L (ref 3.5–5.1)
Sodium: 140 mmol/L (ref 135–145)

## 2020-08-29 LAB — LACTATE DEHYDROGENASE: LDH: 220 U/L — ABNORMAL HIGH (ref 98–192)

## 2020-08-29 MED ORDER — ACD FORMULA A 0.73-2.45-2.2 GM/100ML VI SOLN
Status: AC
  Administered 2020-08-29: 1000 mL
  Filled 2020-08-29: qty 500

## 2020-08-29 MED ORDER — CALCIUM GLUCONATE-NACL 2-0.675 GM/100ML-% IV SOLN: 2.0000 g | Freq: Once | INTRAVENOUS | Status: AC

## 2020-08-29 MED ORDER — CALCIUM GLUCONATE-NACL 2-0.675 GM/100ML-% IV SOLN
INTRAVENOUS | Status: AC
  Administered 2020-08-29: 2000 mg via INTRAVENOUS
  Filled 2020-08-29: qty 100

## 2020-08-29 MED ORDER — CAPLACIZUMAB-YHDP 11 MG IJ KIT
11.0000 mg | PACK | Freq: Once | INTRAMUSCULAR | Status: AC
  Administered 2020-08-29: 11 mg via SUBCUTANEOUS
  Filled 2020-08-29: qty 1

## 2020-08-29 MED ORDER — CALCIUM CARBONATE ANTACID 500 MG PO CHEW
2.0000 | CHEWABLE_TABLET | ORAL | Status: AC
  Administered 2020-08-29: 400 mg via ORAL

## 2020-08-29 MED ORDER — ACETAMINOPHEN 325 MG PO TABS
ORAL_TABLET | ORAL | Status: AC
  Administered 2020-08-29: 650 mg via ORAL
  Filled 2020-08-29: qty 2

## 2020-08-29 MED ORDER — ACD FORMULA A 0.73-2.45-2.2 GM/100ML VI SOLN: 1000.0000 mL | Status: DC

## 2020-08-29 MED ORDER — ANTICOAGULANT SODIUM CITRATE 4% (200MG/5ML) IV SOLN
5.0000 mL | Freq: Once | Status: DC
  Filled 2020-08-29: qty 5

## 2020-08-29 MED ORDER — DIPHENHYDRAMINE HCL 25 MG PO CAPS
ORAL_CAPSULE | ORAL | Status: AC
  Administered 2020-08-29: 25 mg via ORAL
  Filled 2020-08-29: qty 1

## 2020-08-29 MED ORDER — CALCIUM CARBONATE ANTACID 500 MG PO CHEW
CHEWABLE_TABLET | ORAL | Status: AC
  Administered 2020-08-29: 400 mg via ORAL
  Filled 2020-08-29: qty 4

## 2020-08-29 MED ORDER — ACETAMINOPHEN 325 MG PO TABS: 650.0000 mg | ORAL_TABLET | ORAL | Status: DC | PRN

## 2020-08-29 MED ORDER — DIPHENHYDRAMINE HCL 25 MG PO CAPS: 25.0000 mg | ORAL_CAPSULE | Freq: Four times a day (QID) | ORAL | Status: DC | PRN

## 2020-08-29 NOTE — Progress Notes (Signed)
Tx completed without any complications; pt denies pain, dizziness, paresthesia, n/v or SOB. I accompanied the pt to the main entrance to her waiting daughter.

## 2020-08-30 LAB — THERAPEUTIC PLASMA EXCHANGE (BLOOD BANK)
Plasma volume needed: 2300
Unit division: 0
Unit division: 0
Unit division: 0
Unit division: 0
Unit division: 0

## 2020-08-31 ENCOUNTER — Telehealth: Payer: Self-pay | Admitting: Nurse Practitioner

## 2020-08-31 ENCOUNTER — Other Ambulatory Visit (HOSPITAL_COMMUNITY): Payer: Self-pay

## 2020-08-31 ENCOUNTER — Non-Acute Institutional Stay (HOSPITAL_COMMUNITY)
Admission: RE | Admit: 2020-08-31 | Discharge: 2020-08-31 | Disposition: A | Discharge status: Home / Self Care | Payer: Medicare Other | Source: Ambulatory Visit | Attending: Oncology | Admitting: Oncology

## 2020-08-31 DIAGNOSIS — M3119 Other thrombotic microangiopathy: Secondary | ICD-10-CM | POA: Diagnosis present

## 2020-08-31 LAB — BASIC METABOLIC PANEL
Anion gap: 10 (ref 5–15)
BUN: 35 mg/dL — ABNORMAL HIGH (ref 8–23)
CO2: 29 mmol/L (ref 22–32)
Calcium: 8.8 mg/dL — ABNORMAL LOW (ref 8.9–10.3)
Chloride: 101 mmol/L (ref 98–111)
Creatinine, Ser: 1.14 mg/dL — ABNORMAL HIGH (ref 0.44–1.00)
GFR, Estimated: 49 mL/min — ABNORMAL LOW (ref >60)
Glucose, Bld: 121 mg/dL — ABNORMAL HIGH (ref 70–99)
Potassium: 4.6 mmol/L (ref 3.5–5.1)
Sodium: 140 mmol/L (ref 135–145)

## 2020-08-31 LAB — CBC
HCT: 40.9 % (ref 36.0–46.0)
Hemoglobin: 13.2 g/dL (ref 12.0–15.0)
MCH: 32.9 pg (ref 26.0–34.0)
MCHC: 32.3 g/dL (ref 30.0–36.0)
MCV: 102 fL — ABNORMAL HIGH (ref 80.0–100.0)
Platelets: 177 10*3/uL (ref 150–400)
RBC: 4.01 MIL/uL (ref 3.87–5.11)
RDW: 15.3 % (ref 11.5–15.5)
WBC: 12 10*3/uL — ABNORMAL HIGH (ref 4.0–10.5)
nRBC: 0.2 % (ref 0.0–0.2)

## 2020-08-31 LAB — LACTATE DEHYDROGENASE: LDH: 215 U/L — ABNORMAL HIGH (ref 98–192)

## 2020-08-31 MED ORDER — CALCIUM CARBONATE ANTACID 500 MG PO CHEW
2.0000 | CHEWABLE_TABLET | ORAL | Status: AC
  Administered 2020-08-31: 400 mg via ORAL

## 2020-08-31 MED ORDER — CAPLACIZUMAB-YHDP 11 MG IJ KIT: 11.0000 mg | PACK | Freq: Every day | INTRAMUSCULAR | 0 refills | Status: DC

## 2020-08-31 MED ORDER — ACD FORMULA A 0.73-2.45-2.2 GM/100ML VI SOLN: 1000.0000 mL | Status: DC

## 2020-08-31 MED ORDER — CAPLACIZUMAB-YHDP 11 MG IJ KIT
11.0000 mg | PACK | Freq: Once | INTRAMUSCULAR | Status: AC
  Administered 2020-08-31: 11 mg via SUBCUTANEOUS
  Filled 2020-08-31: qty 1

## 2020-08-31 MED ORDER — CALCIUM CARBONATE ANTACID 500 MG PO CHEW
CHEWABLE_TABLET | ORAL | Status: AC
  Administered 2020-08-31: 400 mg via ORAL
  Filled 2020-08-31: qty 4

## 2020-08-31 MED ORDER — DIPHENHYDRAMINE HCL 25 MG PO CAPS: 25.0000 mg | ORAL_CAPSULE | Freq: Four times a day (QID) | ORAL | Status: DC | PRN

## 2020-08-31 MED ORDER — CALCIUM GLUCONATE-NACL 2-0.675 GM/100ML-% IV SOLN
2.0000 g | Freq: Once | INTRAVENOUS | Status: AC
Start: 2020-08-31 — End: 2020-08-31

## 2020-08-31 MED ORDER — ACETAMINOPHEN 325 MG PO TABS: 650.0000 mg | ORAL_TABLET | ORAL | Status: DC | PRN

## 2020-08-31 MED ORDER — ACETAMINOPHEN 325 MG PO TABS
ORAL_TABLET | ORAL | Status: AC
  Administered 2020-08-31: 650 mg via ORAL
  Filled 2020-08-31: qty 2

## 2020-08-31 MED ORDER — DIPHENHYDRAMINE HCL 25 MG PO CAPS
ORAL_CAPSULE | ORAL | Status: AC
  Administered 2020-08-31: 25 mg via ORAL
  Filled 2020-08-31: qty 1

## 2020-08-31 MED ORDER — ACD FORMULA A 0.73-2.45-2.2 GM/100ML VI SOLN
Status: AC
  Administered 2020-08-31: 1000 mL
  Filled 2020-08-31: qty 500

## 2020-08-31 MED ORDER — ANTICOAGULANT SODIUM CITRATE 4% (200MG/5ML) IV SOLN
5.0000 mL | Freq: Once | Status: DC
  Filled 2020-08-31: qty 5

## 2020-08-31 MED ORDER — CALCIUM GLUCONATE-NACL 2-0.675 GM/100ML-% IV SOLN
INTRAVENOUS | Status: AC
  Administered 2020-08-31: 2000 mg via INTRAVENOUS
  Filled 2020-08-31: qty 100

## 2020-08-31 NOTE — Addendum Note (Signed)
Addended byBritt Boozer on: 08/31/2020 11:48 AM   Modules accepted: Orders

## 2020-08-31 NOTE — Progress Notes (Signed)
Oral Oncology Pharmacist Encounter  Notified patient's insurance requires caplacizumab be filled through [facility] [employer] Prescription redirected to [facility] for dispensing - patient information and insurance shared with pharmacy for processing.   Leron Croak, PharmD, BCPS Hematology/Oncology Clinical Pharmacist [facility] [phone] 08/31/2020 11:47 AM

## 2020-08-31 NOTE — Progress Notes (Signed)
tx completed; Pt denies any pain, dizziness, SOB, n/v, states she feels more steady on her feet than when she came in. Pt accompanied to the main entrance by Dara Hoyer, Secretary.

## 2020-09-01 ENCOUNTER — Telehealth: Payer: Self-pay

## 2020-09-01 LAB — THERAPEUTIC PLASMA EXCHANGE (BLOOD BANK)
Plasma Exchange: 2200
Plasma volume needed: 2200
Unit division: 0
Unit division: 0
Unit division: 0

## 2020-09-01 NOTE — Telephone Encounter (Signed)
-----   Message from Ladell Pier, MD sent at 08/31/2020  7:54 PM EDT ----- Add fibrinogen to labs at dialysis unit 5/4

## 2020-09-01 NOTE — Telephone Encounter (Addendum)
TC to [facility] spoke with [place] to add Fibrinogen level to lab orders. Order added.

## 2020-09-02 ENCOUNTER — Telehealth: Payer: Self-pay

## 2020-09-02 ENCOUNTER — Non-Acute Institutional Stay (HOSPITAL_COMMUNITY)
Admission: RE | Admit: 2020-09-02 | Discharge: 2020-09-02 | Disposition: A | Discharge status: Home / Self Care | Payer: Medicare Other | Source: Ambulatory Visit | Attending: Oncology | Admitting: Oncology

## 2020-09-02 DIAGNOSIS — M3119 Other thrombotic microangiopathy: Secondary | ICD-10-CM | POA: Diagnosis not present

## 2020-09-02 LAB — BASIC METABOLIC PANEL
Anion gap: 7 (ref 5–15)
BUN: 29 mg/dL — ABNORMAL HIGH (ref 8–23)
CO2: 28 mmol/L (ref 22–32)
Calcium: 8.3 mg/dL — ABNORMAL LOW (ref 8.9–10.3)
Chloride: 102 mmol/L (ref 98–111)
Creatinine, Ser: 1.12 mg/dL — ABNORMAL HIGH (ref 0.44–1.00)
GFR, Estimated: 50 mL/min — ABNORMAL LOW (ref >60)
Glucose, Bld: 120 mg/dL — ABNORMAL HIGH (ref 70–99)
Potassium: 4.3 mmol/L (ref 3.5–5.1)
Sodium: 137 mmol/L (ref 135–145)

## 2020-09-02 LAB — LACTATE DEHYDROGENASE: LDH: 258 U/L — ABNORMAL HIGH (ref 98–192)

## 2020-09-02 LAB — CBC
HCT: 39.4 % (ref 36.0–46.0)
Hemoglobin: 12.6 g/dL (ref 12.0–15.0)
MCH: 32.8 pg (ref 26.0–34.0)
MCHC: 32 g/dL (ref 30.0–36.0)
MCV: 102.6 fL — ABNORMAL HIGH (ref 80.0–100.0)
Platelets: 183 10*3/uL (ref 150–400)
RBC: 3.84 MIL/uL — ABNORMAL LOW (ref 3.87–5.11)
RDW: 15.3 % (ref 11.5–15.5)
WBC: 9.5 10*3/uL (ref 4.0–10.5)
nRBC: 0.2 % (ref 0.0–0.2)

## 2020-09-02 LAB — FIBRINOGEN: Fibrinogen: 310 mg/dL (ref 210–475)

## 2020-09-02 MED ORDER — CALCIUM GLUCONATE-NACL 2-0.675 GM/100ML-% IV SOLN
2.0000 g | Freq: Once | INTRAVENOUS | Status: AC
Start: 2020-09-02 — End: 2020-09-02

## 2020-09-02 MED ORDER — ACETAMINOPHEN 325 MG PO TABS
ORAL_TABLET | ORAL | Status: AC
  Administered 2020-09-02: 650 mg via ORAL
  Filled 2020-09-02: qty 2

## 2020-09-02 MED ORDER — CALCIUM GLUCONATE-NACL 2-0.675 GM/100ML-% IV SOLN
INTRAVENOUS | Status: AC
  Administered 2020-09-02: 2000 mg via INTRAVENOUS
  Filled 2020-09-02: qty 100

## 2020-09-02 MED ORDER — DIPHENHYDRAMINE HCL 25 MG PO CAPS
ORAL_CAPSULE | ORAL | Status: AC
  Administered 2020-09-02: 25 mg via ORAL
  Filled 2020-09-02: qty 1

## 2020-09-02 MED ORDER — DIPHENHYDRAMINE HCL 25 MG PO CAPS: 25.0000 mg | ORAL_CAPSULE | Freq: Four times a day (QID) | ORAL | Status: DC | PRN

## 2020-09-02 MED ORDER — ACD FORMULA A 0.73-2.45-2.2 GM/100ML VI SOLN: 1000.0000 mL | Status: DC

## 2020-09-02 MED ORDER — ANTICOAGULANT SODIUM CITRATE 4% (200MG/5ML) IV SOLN
5.0000 mL | Freq: Once | Status: DC
  Filled 2020-09-02: qty 5

## 2020-09-02 MED ORDER — ACETAMINOPHEN 325 MG PO TABS: 650.0000 mg | ORAL_TABLET | ORAL | Status: DC | PRN

## 2020-09-02 MED ORDER — CAPLACIZUMAB-YHDP 11 MG IJ KIT
11.0000 mg | PACK | Freq: Once | INTRAMUSCULAR | Status: AC
  Administered 2020-09-02: 11 mg via SUBCUTANEOUS
  Filled 2020-09-02: qty 1

## 2020-09-02 MED ORDER — CALCIUM CARBONATE ANTACID 500 MG PO CHEW
CHEWABLE_TABLET | ORAL | Status: AC
  Administered 2020-09-02: 400 mg via ORAL
  Filled 2020-09-02: qty 2

## 2020-09-02 MED ORDER — ACD FORMULA A 0.73-2.45-2.2 GM/100ML VI SOLN
Status: AC
  Administered 2020-09-02: 1000 mL
  Filled 2020-09-02: qty 500

## 2020-09-02 MED ORDER — CALCIUM CARBONATE ANTACID 500 MG PO CHEW: 2.0000 | CHEWABLE_TABLET | ORAL | Status: AC

## 2020-09-02 NOTE — Telephone Encounter (Signed)
This nurse called placed lab orders to dialysis as requested by provider; Fibrinogen and ADAMTS13

## 2020-09-02 NOTE — Telephone Encounter (Signed)
-----   Message from Lisa K Thomas, NP sent at 09/02/2020  7:39 AM EDT ----- Please call dialysis and request fibrinogen and ADAMTS13 activity be drawn today  

## 2020-09-02 NOTE — Progress Notes (Signed)
Therapeutic Plasma Exchange complete. Patient departed unit via wheelchair by [facility] to a waiting car  alert oriented vitals stable w/o complaint. Faxed documents as requested. Patient is aware to return to [place] for treatment on 09/05/19.

## 2020-09-02 NOTE — Telephone Encounter (Signed)
-----   Message from [name], NP sent at 09/02/2020  7:39 AM EDT ----- Please call dialysis and request fibrinogen and ADAMTS13 activity be drawn today

## 2020-09-03 ENCOUNTER — Telehealth: Payer: Self-pay | Admitting: *Deleted

## 2020-09-03 ENCOUNTER — Inpatient Hospital Stay: Payer: Medicare Other | Admitting: Oncology

## 2020-09-03 ENCOUNTER — Other Ambulatory Visit: Payer: Self-pay

## 2020-09-03 DIAGNOSIS — M3119 Other thrombotic microangiopathy: Secondary | ICD-10-CM

## 2020-09-03 LAB — THERAPEUTIC PLASMA EXCHANGE (BLOOD BANK)
Plasma volume needed: 2371
Unit division: 0
Unit division: 0
Unit division: 0
Unit division: 0

## 2020-09-03 NOTE — Progress Notes (Signed)
Labs entered to be obtained tomorrow

## 2020-09-03 NOTE — Telephone Encounter (Signed)
Called patient to f/u on "no show" today. Left voice mail. Called daughter, Gwinda Passe and she reports they were not aware of her having an appointment. Expresses concern that she has missed dose on 5/1, 5/3 and today of the [place].  Provided her with the phone # for case manager, Hulen Shouts to call, since CM was going to call to arrange delivery. Phone #[phone]. Agrees to see Dr. Benay Spice next week. Scheduling message sent.

## 2020-09-04 ENCOUNTER — Encounter: Payer: Self-pay | Admitting: *Deleted

## 2020-09-04 ENCOUNTER — Non-Acute Institutional Stay (HOSPITAL_COMMUNITY)
Admission: RE | Admit: 2020-09-04 | Discharge: 2020-09-04 | Disposition: A | Discharge status: Home / Self Care | Payer: Medicare Other | Source: Ambulatory Visit | Attending: Oncology | Admitting: Oncology

## 2020-09-04 ENCOUNTER — Other Ambulatory Visit: Payer: Self-pay | Admitting: *Deleted

## 2020-09-04 ENCOUNTER — Encounter: Payer: Self-pay | Admitting: Oncology

## 2020-09-04 DIAGNOSIS — M3119 Other thrombotic microangiopathy: Secondary | ICD-10-CM

## 2020-09-04 LAB — COMPREHENSIVE METABOLIC PANEL
ALT: 40 U/L (ref 0–44)
AST: 28 U/L (ref 15–41)
Albumin: 3.1 g/dL — ABNORMAL LOW (ref 3.5–5.0)
Alkaline Phosphatase: 50 U/L (ref 38–126)
Anion gap: 6 (ref 5–15)
BUN: 24 mg/dL — ABNORMAL HIGH (ref 8–23)
CO2: 31 mmol/L (ref 22–32)
Calcium: 8.8 mg/dL — ABNORMAL LOW (ref 8.9–10.3)
Chloride: 103 mmol/L (ref 98–111)
Creatinine, Ser: 1.06 mg/dL — ABNORMAL HIGH (ref 0.44–1.00)
GFR, Estimated: 54 mL/min — ABNORMAL LOW (ref >60)
Glucose, Bld: 88 mg/dL (ref 70–99)
Potassium: 4.1 mmol/L (ref 3.5–5.1)
Sodium: 140 mmol/L (ref 135–145)
Total Bilirubin: 0.6 mg/dL (ref 0.3–1.2)
Total Protein: 5.3 g/dL — ABNORMAL LOW (ref 6.5–8.1)

## 2020-09-04 LAB — FIBRINOGEN: Fibrinogen: 315 mg/dL (ref 210–475)

## 2020-09-04 LAB — CBC
HCT: 39.9 % (ref 36.0–46.0)
Hemoglobin: 13 g/dL (ref 12.0–15.0)
MCH: 32.8 pg (ref 26.0–34.0)
MCHC: 32.6 g/dL (ref 30.0–36.0)
MCV: 100.8 fL — ABNORMAL HIGH (ref 80.0–100.0)
Platelets: 203 10*3/uL (ref 150–400)
RBC: 3.96 MIL/uL (ref 3.87–5.11)
RDW: 15 % (ref 11.5–15.5)
WBC: 11.1 10*3/uL — ABNORMAL HIGH (ref 4.0–10.5)
nRBC: 0 % (ref 0.0–0.2)

## 2020-09-04 MED ORDER — CALCIUM GLUCONATE-NACL 2-0.675 GM/100ML-% IV SOLN
INTRAVENOUS | Status: AC
  Administered 2020-09-04: 2000 mg via INTRAVENOUS
  Filled 2020-09-04: qty 100

## 2020-09-04 MED ORDER — ACETAMINOPHEN 325 MG PO TABS
ORAL_TABLET | ORAL | Status: AC
  Administered 2020-09-04: 650 mg via ORAL
  Filled 2020-09-04: qty 2

## 2020-09-04 MED ORDER — ACD FORMULA A 0.73-2.45-2.2 GM/100ML VI SOLN: 1000.0000 mL | Status: DC

## 2020-09-04 MED ORDER — DIPHENHYDRAMINE HCL 25 MG PO CAPS: 25.0000 mg | ORAL_CAPSULE | Freq: Four times a day (QID) | ORAL | Status: DC | PRN

## 2020-09-04 MED ORDER — CAPLACIZUMAB-YHDP 11 MG IJ KIT
11.0000 mg | PACK | Freq: Once | INTRAMUSCULAR | Status: AC
  Administered 2020-09-04: 11 mg via SUBCUTANEOUS
  Filled 2020-09-04 (×2): qty 1

## 2020-09-04 MED ORDER — CALCIUM CARBONATE ANTACID 500 MG PO CHEW
CHEWABLE_TABLET | ORAL | Status: AC
  Administered 2020-09-04: 400 mg via ORAL
  Filled 2020-09-04: qty 4

## 2020-09-04 MED ORDER — ANTICOAGULANT SODIUM CITRATE 4% (200MG/5ML) IV SOLN
5.0000 mL | Freq: Once | Status: AC
  Administered 2020-09-04: 5 mL
  Filled 2020-09-04 (×2): qty 5

## 2020-09-04 MED ORDER — ACETAMINOPHEN 325 MG PO TABS: 650.0000 mg | ORAL_TABLET | ORAL | Status: DC | PRN

## 2020-09-04 MED ORDER — ACD FORMULA A 0.73-2.45-2.2 GM/100ML VI SOLN
Status: AC
  Administered 2020-09-04: 1000 mL
  Filled 2020-09-04: qty 500

## 2020-09-04 MED ORDER — CALCIUM GLUCONATE-NACL 2-0.675 GM/100ML-% IV SOLN: 2.0000 g | Freq: Once | INTRAVENOUS | Status: AC

## 2020-09-04 MED ORDER — DIPHENHYDRAMINE HCL 25 MG PO CAPS
ORAL_CAPSULE | ORAL | Status: AC
  Administered 2020-09-04: 25 mg via ORAL
  Filled 2020-09-04: qty 1

## 2020-09-04 MED ORDER — CALCIUM CARBONATE ANTACID 500 MG PO CHEW
2.0000 | CHEWABLE_TABLET | ORAL | Status: AC
  Administered 2020-09-04: 400 mg via ORAL

## 2020-09-04 NOTE — Progress Notes (Signed)
Tx completed; Pt denies pain, n/v, paresthesia, dizziness, or weakness.  I  accompanied the patient to the main entrance to her waiting daughter.

## 2020-09-04 NOTE — Progress Notes (Signed)
Call from charge nurse at hemodialysis unit at [phone] requesting confirmation that today is last scheduled day of pheresis. Confirmed w/Dr. Benay Spice, and that she needs labs on Monday. High priority scheduling message sent.

## 2020-09-05 LAB — THERAPEUTIC PLASMA EXCHANGE (BLOOD BANK)
Plasma Exchange: 2300
Plasma volume needed: 2300
Unit division: 0
Unit division: 0
Unit division: 0
Unit division: 0

## 2020-09-05 LAB — ADAMTS13 ACTIVITY: Adamts 13 Activity: 37.1 % — ABNORMAL LOW (ref >66.8)

## 2020-09-05 LAB — ADAMTS13 ACTIVITY REFLEX

## 2020-09-07 ENCOUNTER — Inpatient Hospital Stay: Payer: Medicare Other | Attending: Oncology

## 2020-09-07 ENCOUNTER — Other Ambulatory Visit: Payer: Self-pay

## 2020-09-07 ENCOUNTER — Telehealth: Payer: Self-pay | Admitting: Oncology

## 2020-09-07 ENCOUNTER — Inpatient Hospital Stay (HOSPITAL_BASED_OUTPATIENT_CLINIC_OR_DEPARTMENT_OTHER): Payer: Medicare Other | Admitting: Nurse Practitioner

## 2020-09-07 ENCOUNTER — Inpatient Hospital Stay: Payer: Medicare Other

## 2020-09-07 VITALS — BP 115/49 | HR 100 | Temp 98.2°F | Resp 20

## 2020-09-07 VITALS — BP 118/60 | HR 99 | Temp 97.8°F | Resp 18 | Ht 64.0 in

## 2020-09-07 DIAGNOSIS — M549 Dorsalgia, unspecified: Secondary | ICD-10-CM | POA: Insufficient documentation

## 2020-09-07 DIAGNOSIS — R531 Weakness: Secondary | ICD-10-CM | POA: Diagnosis not present

## 2020-09-07 DIAGNOSIS — Z862 Personal history of diseases of the blood and blood-forming organs and certain disorders involving the immune mechanism: Secondary | ICD-10-CM

## 2020-09-07 DIAGNOSIS — J449 Chronic obstructive pulmonary disease, unspecified: Secondary | ICD-10-CM | POA: Insufficient documentation

## 2020-09-07 DIAGNOSIS — R7402 Elevation of levels of lactic acid dehydrogenase (LDH): Secondary | ICD-10-CM | POA: Diagnosis not present

## 2020-09-07 DIAGNOSIS — G8929 Other chronic pain: Secondary | ICD-10-CM | POA: Insufficient documentation

## 2020-09-07 DIAGNOSIS — Z79899 Other long term (current) drug therapy: Secondary | ICD-10-CM | POA: Diagnosis not present

## 2020-09-07 DIAGNOSIS — M3119 Other thrombotic microangiopathy: Secondary | ICD-10-CM | POA: Insufficient documentation

## 2020-09-07 LAB — LACTATE DEHYDROGENASE: LDH: 236 U/L — ABNORMAL HIGH (ref 98–192)

## 2020-09-07 LAB — CBC WITH DIFFERENTIAL (CANCER CENTER ONLY)
Abs Immature Granulocytes: 0.36 10*3/uL — ABNORMAL HIGH (ref 0.00–0.07)
Basophils Absolute: 0.1 10*3/uL (ref 0.0–0.1)
Basophils Relative: 1 %
Eosinophils Absolute: 0.1 10*3/uL (ref 0.0–0.5)
Eosinophils Relative: 1 %
HCT: 41.2 % (ref 36.0–46.0)
Hemoglobin: 13.1 g/dL (ref 12.0–15.0)
Immature Granulocytes: 5 %
Lymphocytes Relative: 15 %
Lymphs Abs: 1.2 10*3/uL (ref 0.7–4.0)
MCH: 32 pg (ref 26.0–34.0)
MCHC: 31.8 g/dL (ref 30.0–36.0)
MCV: 100.7 fL — ABNORMAL HIGH (ref 80.0–100.0)
Monocytes Absolute: 0.3 10*3/uL (ref 0.1–1.0)
Monocytes Relative: 4 %
Neutro Abs: 5.9 10*3/uL (ref 1.7–7.7)
Neutrophils Relative %: 74 %
Platelet Count: 178 10*3/uL (ref 150–400)
RBC: 4.09 MIL/uL (ref 3.87–5.11)
RDW: 15.5 % (ref 11.5–15.5)
WBC Count: 7.9 10*3/uL (ref 4.0–10.5)
nRBC: 0.9 % — ABNORMAL HIGH (ref 0.0–0.2)

## 2020-09-07 LAB — CMP (CANCER CENTER ONLY)
ALT: 44 U/L (ref 0–44)
AST: 28 U/L (ref 15–41)
Albumin: 3.3 g/dL — ABNORMAL LOW (ref 3.5–5.0)
Alkaline Phosphatase: 53 U/L (ref 38–126)
Anion gap: 7 (ref 5–15)
BUN: 22 mg/dL (ref 8–23)
CO2: 30 mmol/L (ref 22–32)
Calcium: 8.1 mg/dL — ABNORMAL LOW (ref 8.9–10.3)
Chloride: 102 mmol/L (ref 98–111)
Creatinine: 1.12 mg/dL — ABNORMAL HIGH (ref 0.44–1.00)
GFR, Estimated: 50 mL/min — ABNORMAL LOW (ref >60)
Glucose, Bld: 161 mg/dL — ABNORMAL HIGH (ref 70–99)
Potassium: 4 mmol/L (ref 3.5–5.1)
Sodium: 139 mmol/L (ref 135–145)
Total Bilirubin: 0.4 mg/dL (ref 0.3–1.2)
Total Protein: 5.7 g/dL — ABNORMAL LOW (ref 6.5–8.1)

## 2020-09-07 LAB — RETICULOCYTES
Immature Retic Fract: 25.3 % — ABNORMAL HIGH (ref 2.3–15.9)
RBC.: 4.11 MIL/uL (ref 3.87–5.11)
Retic Count, Absolute: 108.9 10*3/uL (ref 19.0–186.0)
Retic Ct Pct: 2.7 % (ref 0.4–3.1)

## 2020-09-07 MED ORDER — SODIUM CHLORIDE 0.9% FLUSH
10.0000 mL | Freq: Once | INTRAVENOUS | Status: AC
Start: 2020-09-07 — End: 2020-09-07
  Administered 2020-09-07: 10 mL via INTRAVENOUS
  Filled 2020-09-07: qty 10

## 2020-09-07 MED ORDER — PREDNISONE 10 MG PO TABS: 30.0000 mg | ORAL_TABLET | Freq: Every day | ORAL | 1 refills | Status: DC

## 2020-09-07 MED ORDER — HEPARIN SOD (PORK) LOCK FLUSH 100 UNIT/ML IV SOLN
250.0000 [IU] | Freq: Once | INTRAVENOUS | Status: AC
  Administered 2020-09-07: 250 [IU] via INTRAVENOUS
  Filled 2020-09-07: qty 5

## 2020-09-07 MED ORDER — HEPARIN SOD (PORK) LOCK FLUSH 100 UNIT/ML IV SOLN
500.0000 [IU] | Freq: Once | INTRAVENOUS | Status: DC
  Filled 2020-09-07: qty 5

## 2020-09-07 NOTE — Progress Notes (Addendum)
[facility] OFFICE PROGRESS NOTE   Diagnosis: TTP  INTERVAL HISTORY:   Ms. Weinheimer returns prior to scheduled follow-up.  Her daughter contacted the office earlier today to request an appointment for evaluation while here for blood work.  Last plasmapheresis 09/04/2020.  Current prednisone dose 40 mg daily.  Daughter reports Cablivi given on Saturday, 09/05/2020.  She held [place] on 09/06/2020 due to bruising along the left side of her face and left upper outer leg, both results of a fall earlier in the week.  Daughter reports small bruises at [place] injection sites.  Ms. Nagele in general feels weak, especially in the legs.  She slept "all day yesterday".  No fever or chills.  She states her breathing is "fine".  Blurry vision.  No diplopia.  No headaches.  Objective:  Vital signs in last 24 hours:  Blood pressure 118/60, pulse 99, temperature 97.8 F (36.6 C), temperature source Oral, resp. rate 18, height 5\' 4"  (1.626 m), SpO2 96 %.    HEENT: Small ulceration left buccal mucosa.  No active bleeding or thrush. Resp: Bilateral wheezes.  No respiratory distress. Cardio: Regular rate and rhythm. GI: Abdomen soft and nontender.  No hepatosplenomegaly. Vascular: Pitting edema lower leg bilaterally. Neuro: Alert and oriented.  Follows commands.  Upper extremity motor strength intact.  Mild weakness proximal leg bilaterally. Skin: A few small ecchymoses at the abdominal wall.  Resolving ecchymosis along the left side of the face.  Large ecchymosis with early signs of resolution left upper lateral leg. Left neck catheter without erythema.   Lab Results:  Lab Results  Component Value Date   WBC 7.9 09/07/2020   HGB 13.1 09/07/2020   HCT 41.2 09/07/2020   MCV 100.7 (H) 09/07/2020   PLT 178 09/07/2020   NEUTROABS 5.9 09/07/2020    Imaging:  No results found.  Medications: I have reviewed the patient's current medications.  Assessment/Plan: 1. TTP diagnosed in 2005,  treated with plasma exchange, steroids, and rituximab consolidation ? 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 Relapse of TTP March 2015-plasma exchange, steroids, rituximab, and maintenance azathioprine ? Admission with severe thrombocytopenia and elevated LDH 07/16/2020, ADAMTS13 activity less than 2%,consistent with relapse of TTP -Steroids/FFP given 07/16/2020 -Daily plasma exchange beginning 07/17/2020, 7 days, then qod starting 3/24, discharge 07/21/2020 on prednisone 60 mg daily -Prednisone taper to 40 mg daily 07/29/2020 -rituximab 07/30/2020, 08/06/2020, 08/13/2020, 08/20/2020 -plasmapheresis on 07/31/2020, 08/03/2020, 08/05/2020, and 08/07/2020 -prednisone increased to 60 mg daily 08/17/2020 -Plasmapheresis 08/24/2020, 08/25/2020, 08/26/2020, 08/27/2020, 08/28/2020, 08/29/2020, 08/31/2020, 09/02/2020, 09/04/2020 -caplacizumabinitiated 08/24/2020       -Decrease prednisone to 40 mg daily 08/28/2020  -ADAMTS13 37% on 09/04/2020  -Prednisone decreased to 30 mg daily 09/07/2020  -Continue caplacizumab 2. History of epidural abscess requiring laminectomy 2012 3. 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 COPD   Disposition: Ms. Round appears stable.  Last plasmapheresis 09/04/2020.  Review of labs from today show platelet count and hemoglobin remain in normal range, LDH mildly elevated.  She will decrease prednisone to 30 mg daily.  New prescription sent to her pharmacy.  Monitor off of plasmapheresis.  We discussed removing the pheresis catheter due to infection risk.  She would like to leave the catheter in place for now.  She understands to contact the office with fever, chills, other signs of infection.  Recommend resume daily caplacizumab.  The bruising appears to mainly be related  to a fall, not spontaneous. Ms. Stepien and her daughter agree with the above.   The  leg weakness, which is proximal on exam, may be related to steroids.  Prednisone will be tapered as above.  We are making a referral to physical and Occupational Therapy.  We discussed ambulating with a walker.  She will return for lab and follow-up on 09/10/2020.  We are available to see her sooner if needed.  Patient seen with Dr. Benay Spice.    [name] ANP/GNP-BC   09/07/2020  2:34 PM  Ms. Mcphee was interviewed and examined.  We reviewed today's labs with Ms. Elicker and her daughter.  The platelet count and markers of hemolysis have improved over the past 2 weeks.  She is now followed off of plasmapheresis and continues caplacizumab.  Easy bruising at injection sites is likely related to caplacizumab with the large ecchymoses at the left face and thigh being related to the recent fall.  I suspect her general weakness is related to deconditioning and steroid use.  She would like to keep the pheresis catheter in place for now.  She does not have symptoms to suggest a catheter infection.  I was present for greater than 50% of today's visit.  I performed medical decision making.  Julieanne Manson, MD

## 2020-09-07 NOTE — Telephone Encounter (Signed)
Called and spoke with daughter regarding appointments added.  She requested to speak with nurse. IN basket message was sent to [place] [place]. Per 5/6 sch msg

## 2020-09-08 ENCOUNTER — Other Ambulatory Visit: Payer: Self-pay | Admitting: *Deleted

## 2020-09-08 DIAGNOSIS — M3119 Other thrombotic microangiopathy: Secondary | ICD-10-CM

## 2020-09-08 NOTE — Progress Notes (Signed)
Faxed referral order for home PT evaluation to [facility] at [phone]. Deconditioning and falls. Noted to please call daughter w/1st visit appointment.

## 2020-09-09 ENCOUNTER — Other Ambulatory Visit: Payer: Self-pay | Admitting: Nurse Practitioner

## 2020-09-09 DIAGNOSIS — M3119 Other thrombotic microangiopathy: Secondary | ICD-10-CM

## 2020-09-10 ENCOUNTER — Inpatient Hospital Stay: Payer: Medicare Other

## 2020-09-10 ENCOUNTER — Inpatient Hospital Stay (HOSPITAL_BASED_OUTPATIENT_CLINIC_OR_DEPARTMENT_OTHER): Payer: Medicare Other | Admitting: Oncology

## 2020-09-10 ENCOUNTER — Other Ambulatory Visit: Payer: Self-pay

## 2020-09-10 VITALS — BP 143/53 | HR 95 | Temp 98.6°F | Resp 19 | Ht 64.0 in | Wt 163.8 lb

## 2020-09-10 DIAGNOSIS — J449 Chronic obstructive pulmonary disease, unspecified: Secondary | ICD-10-CM | POA: Diagnosis not present

## 2020-09-10 DIAGNOSIS — M3119 Other thrombotic microangiopathy: Secondary | ICD-10-CM

## 2020-09-10 DIAGNOSIS — Z79899 Other long term (current) drug therapy: Secondary | ICD-10-CM

## 2020-09-10 LAB — CBC WITH DIFFERENTIAL (CANCER CENTER ONLY)
Abs Immature Granulocytes: 0.21 10*3/uL — ABNORMAL HIGH (ref 0.00–0.07)
Basophils Absolute: 0 10*3/uL (ref 0.0–0.1)
Basophils Relative: 0 %
Eosinophils Absolute: 0.1 10*3/uL (ref 0.0–0.5)
Eosinophils Relative: 1 %
HCT: 37.9 % (ref 36.0–46.0)
Hemoglobin: 12.2 g/dL (ref 12.0–15.0)
Immature Granulocytes: 2 %
Lymphocytes Relative: 10 %
Lymphs Abs: 0.9 10*3/uL (ref 0.7–4.0)
MCH: 32.2 pg (ref 26.0–34.0)
MCHC: 32.2 g/dL (ref 30.0–36.0)
MCV: 100 fL (ref 80.0–100.0)
Monocytes Absolute: 0.6 10*3/uL (ref 0.1–1.0)
Monocytes Relative: 6 %
Neutro Abs: 7.3 10*3/uL (ref 1.7–7.7)
Neutrophils Relative %: 81 %
Platelet Count: 183 10*3/uL (ref 150–400)
RBC: 3.79 MIL/uL — ABNORMAL LOW (ref 3.87–5.11)
RDW: 15.1 % (ref 11.5–15.5)
WBC Count: 9.1 10*3/uL (ref 4.0–10.5)
nRBC: 0.3 % — ABNORMAL HIGH (ref 0.0–0.2)

## 2020-09-10 LAB — CMP (CANCER CENTER ONLY)
ALT: 42 U/L (ref 0–44)
AST: 28 U/L (ref 15–41)
Albumin: 3.6 g/dL (ref 3.5–5.0)
Alkaline Phosphatase: 59 U/L (ref 38–126)
Anion gap: 6 (ref 5–15)
BUN: 25 mg/dL — ABNORMAL HIGH (ref 8–23)
CO2: 29 mmol/L (ref 22–32)
Calcium: 8.6 mg/dL — ABNORMAL LOW (ref 8.9–10.3)
Chloride: 103 mmol/L (ref 98–111)
Creatinine: 0.89 mg/dL (ref 0.44–1.00)
GFR, Estimated: >60 mL/min (ref >60)
Glucose, Bld: 127 mg/dL — ABNORMAL HIGH (ref 70–99)
Potassium: 4.2 mmol/L (ref 3.5–5.1)
Sodium: 138 mmol/L (ref 135–145)
Total Bilirubin: 0.4 mg/dL (ref 0.3–1.2)
Total Protein: 6 g/dL — ABNORMAL LOW (ref 6.5–8.1)

## 2020-09-10 LAB — LACTATE DEHYDROGENASE: LDH: 258 U/L — ABNORMAL HIGH (ref 98–192)

## 2020-09-10 LAB — RETICULOCYTES
Immature Retic Fract: 22.9 % — ABNORMAL HIGH (ref 2.3–15.9)
RBC.: 3.88 MIL/uL (ref 3.87–5.11)
Retic Count, Absolute: 121.8 10*3/uL (ref 19.0–186.0)
Retic Ct Pct: 3.1 % (ref 0.4–3.1)

## 2020-09-10 MED ORDER — HEPARIN SOD (PORK) LOCK FLUSH 100 UNIT/ML IV SOLN
250.0000 [IU] | Freq: Once | INTRAVENOUS | Status: AC
  Administered 2020-09-10: 250 [IU] via INTRAVENOUS
  Filled 2020-09-10: qty 5

## 2020-09-10 MED ORDER — SODIUM CHLORIDE 0.9% FLUSH
10.0000 mL | Freq: Once | INTRAVENOUS | Status: AC
Start: 2020-09-10 — End: 2020-09-10
  Administered 2020-09-10: 10 mL via INTRAVENOUS
  Filled 2020-09-10: qty 10

## 2020-09-10 MED ORDER — HEPARIN SOD (PORK) LOCK FLUSH 100 UNIT/ML IV SOLN
500.0000 [IU] | Freq: Once | INTRAVENOUS | Status: DC
  Filled 2020-09-10: qty 5

## 2020-09-10 NOTE — Progress Notes (Signed)
  [facility] OFFICE PROGRESS NOTE   Diagnosis: TTP  INTERVAL HISTORY:    [name] returns for a scheduled visit.  She reports feeling well.  No bleeding except for minor bleeding when she blows her nose.  Ecchymoses are resolving.  She continues caplacizumab.  No fever.  Objective:  Vital signs in last 24 hours:  Blood pressure (!) 143/53, pulse 95, temperature 98.6 F (37 C), temperature source Oral, resp. rate 19, height 5\' 4"  (1.626 m), weight 163 lb 12.8 oz (74.3 kg), SpO2 95 %.    HEENT: No thrush Resp: Bilateral inspiratory/expiratory wheeze, no respiratory distress Cardio: Regular rate and rhythm GI: No hepatosplenomegaly Vascular: 1+ pitting edema at the lower leg and foot bilaterally   Skin: Resolving ecchymoses at the left face, abdominal wall, and the left upper thigh  Portacath/PICC-without erythema  Lab Results:  Lab Results  Component Value Date   WBC 9.1 09/10/2020   HGB 12.2 09/10/2020   HCT 37.9 09/10/2020   MCV 100.0 09/10/2020   PLT 183 09/10/2020   NEUTROABS 7.3 09/10/2020    CMP  Lab Results  Component Value Date   NA 138 09/10/2020   K 4.2 09/10/2020   CL 103 09/10/2020   CO2 29 09/10/2020   GLUCOSE 127 (H) 09/10/2020   BUN 25 (H) 09/10/2020   CREATININE 0.89 09/10/2020   CALCIUM 8.6 (L) 09/10/2020   PROT 6.0 (L) 09/10/2020   ALBUMIN 3.6 09/10/2020   AST 28 09/10/2020   ALT 42 09/10/2020   ALKPHOS 59 09/10/2020   BILITOT 0.4 09/10/2020   GFRNONAA >60 09/10/2020   GFRAA 50 (L) 11/25/2019     Medications: I have reviewed the patient's current medications.   Assessment/Plan: 1. TTP diagnosed in 2005, treated with plasma exchange, steroids, and rituximab consolidation ? 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 Relapse of TTP March 2015-plasma exchange, steroids, rituximab, and maintenance azathioprine ? Admission with severe thrombocytopenia and elevated LDH 07/16/2020, ADAMTS13 activity  less than 2%,consistent with relapse of TTP -Steroids/FFP given 07/16/2020 -Daily plasma exchange beginning 07/17/2020, 7 days, then qod starting 3/24, discharge 07/21/2020 on prednisone 60 mg daily -Prednisone taper to 40 mg daily 07/29/2020 -rituximab 07/30/2020, 08/06/2020, 08/13/2020, 08/20/2020 -plasmapheresis on 07/31/2020, 08/03/2020, 08/05/2020, and 08/07/2020 -prednisone increased to 60 mg daily 08/17/2020 -Plasmapheresis 08/24/2020, 08/25/2020, 08/26/2020, 08/27/2020, 08/28/2020, 08/29/2020, 08/31/2020, 09/02/2020, 09/04/2020 -caplacizumabinitiated 08/24/2020       -Decrease prednisone to 40 mg daily 08/28/2020  -ADAMTS13 37% on 09/04/2020  -Prednisone decreased to 30 mg daily 09/07/2020  -Continue caplacizumab 2. History of epidural abscess requiring laminectomy 2012 3. 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 COPD     Disposition: Her performance status has improved compared to when we saw her earlier this week.  She appears to be tolerating the caplacizumab well.  She will continue prednisone at a dose of 30 mg daily.  The platelet count remains in the normal range.  She will remain off of plasmapheresis.  [name] would like to keep the dialysis catheter in place for now.  She will return for a lab visit and catheter flush on 09/14/2020.  She will be scheduled for an office and lab visit on 09/18/2020.  Krystal Coder, MD  09/10/2020  4:23 PM

## 2020-09-14 ENCOUNTER — Inpatient Hospital Stay: Payer: Medicare Other

## 2020-09-14 ENCOUNTER — Other Ambulatory Visit: Payer: Self-pay

## 2020-09-14 ENCOUNTER — Telehealth: Payer: Self-pay | Admitting: *Deleted

## 2020-09-14 DIAGNOSIS — M3119 Other thrombotic microangiopathy: Secondary | ICD-10-CM

## 2020-09-14 LAB — CBC WITH DIFFERENTIAL (CANCER CENTER ONLY)
Abs Immature Granulocytes: 0.34 10*3/uL — ABNORMAL HIGH (ref 0.00–0.07)
Basophils Absolute: 0.1 10*3/uL (ref 0.0–0.1)
Basophils Relative: 1 %
Eosinophils Absolute: 0 10*3/uL (ref 0.0–0.5)
Eosinophils Relative: 0 %
HCT: 38.7 % (ref 36.0–46.0)
Hemoglobin: 12.1 g/dL (ref 12.0–15.0)
Immature Granulocytes: 4 %
Lymphocytes Relative: 10 %
Lymphs Abs: 0.9 10*3/uL (ref 0.7–4.0)
MCH: 31.4 pg (ref 26.0–34.0)
MCHC: 31.3 g/dL (ref 30.0–36.0)
MCV: 100.5 fL — ABNORMAL HIGH (ref 80.0–100.0)
Monocytes Absolute: 0.5 10*3/uL (ref 0.1–1.0)
Monocytes Relative: 5 %
Neutro Abs: 7.7 10*3/uL (ref 1.7–7.7)
Neutrophils Relative %: 80 %
Platelet Count: 201 10*3/uL (ref 150–400)
RBC: 3.85 MIL/uL — ABNORMAL LOW (ref 3.87–5.11)
RDW: 15.4 % (ref 11.5–15.5)
WBC Count: 9.5 10*3/uL (ref 4.0–10.5)
nRBC: 0.3 % — ABNORMAL HIGH (ref 0.0–0.2)

## 2020-09-14 LAB — CMP (CANCER CENTER ONLY)
ALT: 43 U/L (ref 0–44)
AST: 29 U/L (ref 15–41)
Albumin: 3.6 g/dL (ref 3.5–5.0)
Alkaline Phosphatase: 54 U/L (ref 38–126)
Anion gap: 9 (ref 5–15)
BUN: 31 mg/dL — ABNORMAL HIGH (ref 8–23)
CO2: 28 mmol/L (ref 22–32)
Calcium: 8.7 mg/dL — ABNORMAL LOW (ref 8.9–10.3)
Chloride: 104 mmol/L (ref 98–111)
Creatinine: 0.99 mg/dL (ref 0.44–1.00)
GFR, Estimated: 58 mL/min — ABNORMAL LOW (ref >60)
Glucose, Bld: 108 mg/dL — ABNORMAL HIGH (ref 70–99)
Potassium: 4.4 mmol/L (ref 3.5–5.1)
Sodium: 141 mmol/L (ref 135–145)
Total Bilirubin: 0.4 mg/dL (ref 0.3–1.2)
Total Protein: 5.8 g/dL — ABNORMAL LOW (ref 6.5–8.1)

## 2020-09-14 LAB — LACTATE DEHYDROGENASE: LDH: 272 U/L — ABNORMAL HIGH (ref 98–192)

## 2020-09-14 NOTE — Patient Instructions (Signed)
Central Line, Adult A central line is a long, thin tube (catheter) that can be used to collect blood for testing or to give medicine through a vein. The tip of the central line ends in a large vein just above the heart (vena cava). A central line may be placed because:  You need to get medicines or fluids through an IV for a long period of time.  You need nutrition but cannot eat or absorb nutrients.  The veins in your hands or arms are difficult to use for IV access.  You need a blood transfusion.  You need chemotherapy or dialysis. Types of central lines There are four main types of central lines:  Peripherally inserted central catheter (PICC) line. This type is used for access of one week or longer. It can be used to draw blood and give fluids or medicines. A PICC looks like an IV tube, but it goes up the arm to the heart. It is usually inserted in the upper arm and taped in place on the arm.  Tunneled central line. This type is used for long-term therapy and dialysis. It is placed in a large vein in the neck, chest, or groin. It is inserted through a small incision made over the vein, and then it is advanced to the heart. It is tunneled under the skin and brought out through a second incision.  Non-tunneled central line. This type is used for short-term access, usually for a maximum of 7 days. It is often used in the emergency department. It is inserted in the neck, chest, or groin.  Implanted port. This type is used for long-term therapy. It can stay in place longer than other types of central lines. It is normally inserted in the upper chest, but it can also be placed in the upper arm or the abdomen. It is inserted and removed with surgery, and it is accessed using a needle. The type of central line that you receive depends on how long you will need it, your medical condition, and the condition of your veins.   Tell a health care provider about:  Any allergies you have.  All  medicines you are taking, including vitamins, herbs, eye drops, creams, and over-the-counter medicines.  Any problems you or family members have had with anesthetic medicines.  Any blood disorders you have.  Any surgeries you have had.  Any medical conditions you have.  Whether you are pregnant or may be pregnant. What are the risks? Generally, placement and use of a central line is safe. However, problems may occur, including:  Infection.  A blood clot that blocks the central line or forms in the vein and travels to the heart.  Bleeding from the place where the central line was inserted.  Developing a hole or crack within the central line. If this happens, the central line will need to be replaced.  Central line failure.  The catheter moving or coming out of place. What happens before the procedure? Medicines Ask your health care provider about:  Changing or stopping your regular medicines. This is especially important if you are taking diabetes medicines or blood thinners.  Taking medicines such as aspirin and ibuprofen. These medicines can thin your blood. Do not take these medicines unless your health care provider tells you to take them.  Taking over-the-counter medicines, vitamins, herbs, and supplements. General instructions  Follow instructions from your health care provider about eating or drinking restrictions.  Ask your health care provider: ? How your   procedure site will be marked. ? What steps will be taken to help prevent infection. These steps may include:  Removing hair at the procedure site.  Washing skin with a germ-killing soap.  Plan to have a responsible adult take you home from the hospital or clinic.  If you will be going home right after the procedure, plan to have a responsible adult care for you for the time you are told. This is important. What happens during the procedure? The procedure will vary depending on the type of central line being  placed. In general:  An IV will be inserted into one of your veins.  You will be given one or more of the following: ? A medicine to help you relax (sedative). ? A medicine to numb the area (local anesthetic).  Your skin will be cleaned with a germ-killing (antiseptic) solution, and you may be covered with sterile drapes.  Your blood pressure, heart rate, breathing rate, and blood oxygen level will be monitored during the procedure.  The central line catheter will be inserted into the vein and advanced to the correct spot. The health care provider may use X-ray equipment to help guide the catheter to the right place.  A bandage (dressing) will be placed over the insertion area. The procedure may vary among health care providers and hospitals. What can I expect after the procedure?  Your blood pressure, heart rate, breathing rate, and blood oxygen level will be monitored until you leave the hospital or clinic.  Antiseptic caps may be placed on the ends of the central line tubing.  If you were given a sedative during the procedure, it can affect you for several hours. Do not drive or operate machinery until your health care provider says that it is safe. Follow these instructions at home: Flushing and cleaning the central line  Follow instructions from your health care provider about flushing and cleaning the central line and the area around it.  Only use sterile supplies to flush the central line. Use supplies from your health care provider, a pharmacy, or another source that is recommended by your health care provider.  Before you flush the central line or clean the central line or the area around it: ? Wash your hands with soap and water for at least 20 seconds. If soap and water are not available, use alcohol-based hand sanitizer. ? Clean the central line hub with rubbing alcohol. Unless directed otherwise by the manufacturer's instructions, scrub using a twisting motion and rub for  10 to 15 seconds or for 30 twists. Be sure you scrub the top of the hub, not just the sides. Never reuse alcohol pads. Let the hub dry before use. Prevent it from touching anything while drying.   Caring for the incision or central line site  Check your incision or central line site every day for signs of infection. Check for: ? Redness, swelling, or pain. ? Fluid or blood. ? Warmth. ? Pus or a bad smell.  Keep the insertion site of your central line clean and dry at all times.  Change your dressing only as told by your health care provider.  Keep your dressing dry. If it gets wet, have it changed as soon as possible. General instructions  Follow instructions from your health care provider for the type of device that you have.  Keep the tube clamped, unless it is being used.  If the central line accidentally gets pulled on, make sure: ? The dressing is okay. ?   There is no bleeding. ? The line has not been pulled out.  Do not use scissors or sharp objects near the tube.  Do not take baths, swim, or use a hot tub until your health care provider approves. Ask your health care provider if you may take showers. You may only be allowed to take sponge baths.  Ask your health care provider what activities are safe for you. You may be restricted from lifting or making repetitive arm movements on the side of your central line.  Take over-the-counter and prescription medicines only as told by your health care provider.  Keep all follow-up visits. This is important. Storage and disposal of supplies  Keep your supplies in a clean, dry location.  Throw away any used syringes in a disposal container that is meant for sharp items (sharps container). You can buy a sharps container from a pharmacy, or you can make one by using an empty hard plastic bottle with a cover.  Place any used dressings or infusion bags into a plastic bag. Throw that bag in the trash. Contact a health care provider  if:  You have redness, swelling, or pain around your insertion site.  You have fluid or blood coming from your insertion site.  Your insertion site feels warm to the touch.  You have pus or a bad smell coming from your insertion site. Get help right away if:  You have: ? A fever or chills. ? Shortness of breath. ? Chest pain or a racing heartbeat. ? Swelling in your neck, face, chest, or arm on the side of your central line.  You feel dizzy or you faint.  Your incision or central line site has red streaks spreading away from the area.  Your incision or central line site is bleeding and does not stop.  Your central line is difficult to flush or will not flush.  You do not get a blood return from the central line.  Your central line gets loose or damaged or comes out.  Your catheter leaks when flushed or when fluids are infused into it. Summary  A central line is a long, thin tube (catheter) that can be used to give medicine through a vein.  Follow specific instructions from your health care provider for the type of device that you have.  Keep the insertion site of your central line clean and dry at all times.  Keep the tube clamped, unless it is being used. This information is not intended to replace advice given to you by your health care provider. Make sure you discuss any questions you have with your health care provider. Document Revised: 12/19/2019 Document Reviewed: 12/19/2019 Elsevier Patient Education  2021 [employer]  

## 2020-09-14 NOTE — Telephone Encounter (Signed)
Left VM with Esmond Camper, liason for [facility] to f/u on conversation from 09/10/20. What is status of PT referral?

## 2020-09-14 NOTE — Telephone Encounter (Signed)
Notified by Esmond Camper w/[facility] that they are not able to provide home PT for her due to low staffing in [place] area. Patient notified. Faxed referral to [facility]

## 2020-09-15 LAB — ADAMTS13 ANTIBODY: ADAMTS13 Antibody: 2 Units/mL (ref <12)

## 2020-09-15 LAB — ADAMTS13 ACTIVITY: Adamts 13 Activity: 23.6 % — CL (ref >66.8)

## 2020-09-17 ENCOUNTER — Other Ambulatory Visit: Payer: Self-pay

## 2020-09-17 ENCOUNTER — Inpatient Hospital Stay: Payer: Medicare Other

## 2020-09-17 ENCOUNTER — Encounter: Payer: Self-pay | Admitting: *Deleted

## 2020-09-17 ENCOUNTER — Inpatient Hospital Stay (HOSPITAL_BASED_OUTPATIENT_CLINIC_OR_DEPARTMENT_OTHER): Payer: Medicare Other | Admitting: Oncology

## 2020-09-17 VITALS — BP 132/55 | HR 100 | Temp 97.8°F | Resp 18 | Ht 64.0 in | Wt 161.0 lb

## 2020-09-17 DIAGNOSIS — J449 Chronic obstructive pulmonary disease, unspecified: Secondary | ICD-10-CM

## 2020-09-17 DIAGNOSIS — G8929 Other chronic pain: Secondary | ICD-10-CM | POA: Diagnosis not present

## 2020-09-17 DIAGNOSIS — M3119 Other thrombotic microangiopathy: Secondary | ICD-10-CM

## 2020-09-17 DIAGNOSIS — M549 Dorsalgia, unspecified: Secondary | ICD-10-CM | POA: Diagnosis not present

## 2020-09-17 LAB — CBC WITH DIFFERENTIAL (CANCER CENTER ONLY)
Abs Immature Granulocytes: 0.4 10*3/uL — ABNORMAL HIGH (ref 0.00–0.07)
Basophils Absolute: 0.1 10*3/uL (ref 0.0–0.1)
Basophils Relative: 1 %
Eosinophils Absolute: 0.1 10*3/uL (ref 0.0–0.5)
Eosinophils Relative: 1 %
HCT: 36.8 % (ref 36.0–46.0)
Hemoglobin: 11.5 g/dL — ABNORMAL LOW (ref 12.0–15.0)
Immature Granulocytes: 4 %
Lymphocytes Relative: 11 %
Lymphs Abs: 1.2 10*3/uL (ref 0.7–4.0)
MCH: 31.5 pg (ref 26.0–34.0)
MCHC: 31.3 g/dL (ref 30.0–36.0)
MCV: 100.8 fL — ABNORMAL HIGH (ref 80.0–100.0)
Monocytes Absolute: 0.4 10*3/uL (ref 0.1–1.0)
Monocytes Relative: 4 %
Neutro Abs: 8.3 10*3/uL — ABNORMAL HIGH (ref 1.7–7.7)
Neutrophils Relative %: 79 %
Platelet Count: 218 10*3/uL (ref 150–400)
RBC: 3.65 MIL/uL — ABNORMAL LOW (ref 3.87–5.11)
RDW: 15.4 % (ref 11.5–15.5)
WBC Count: 10.5 10*3/uL (ref 4.0–10.5)
nRBC: 0.7 % — ABNORMAL HIGH (ref 0.0–0.2)

## 2020-09-17 LAB — CMP (CANCER CENTER ONLY)
ALT: 47 U/L — ABNORMAL HIGH (ref 0–44)
AST: 32 U/L (ref 15–41)
Albumin: 3.5 g/dL (ref 3.5–5.0)
Alkaline Phosphatase: 50 U/L (ref 38–126)
Anion gap: 6 (ref 5–15)
BUN: 31 mg/dL — ABNORMAL HIGH (ref 8–23)
CO2: 30 mmol/L (ref 22–32)
Calcium: 8.9 mg/dL (ref 8.9–10.3)
Chloride: 103 mmol/L (ref 98–111)
Creatinine: 0.98 mg/dL (ref 0.44–1.00)
GFR, Estimated: 59 mL/min — ABNORMAL LOW (ref >60)
Glucose, Bld: 104 mg/dL — ABNORMAL HIGH (ref 70–99)
Potassium: 4.9 mmol/L (ref 3.5–5.1)
Sodium: 139 mmol/L (ref 135–145)
Total Bilirubin: 0.4 mg/dL (ref 0.3–1.2)
Total Protein: 6.1 g/dL — ABNORMAL LOW (ref 6.5–8.1)

## 2020-09-17 LAB — LACTATE DEHYDROGENASE: LDH: 253 U/L — ABNORMAL HIGH (ref 98–192)

## 2020-09-17 MED ORDER — SODIUM CHLORIDE 0.9% FLUSH
10.0000 mL | Freq: Once | INTRAVENOUS | Status: AC
Start: 2020-09-17 — End: 2020-09-17
  Administered 2020-09-17: 10 mL via INTRAVENOUS
  Filled 2020-09-17: qty 10

## 2020-09-17 MED ORDER — HEPARIN SOD (PORK) LOCK FLUSH 100 UNIT/ML IV SOLN
250.0000 [IU] | Freq: Once | INTRAVENOUS | Status: AC
Start: 2020-09-17 — End: 2020-09-17
  Administered 2020-09-17: 250 [IU] via INTRAVENOUS
  Filled 2020-09-17: qty 5

## 2020-09-17 NOTE — Progress Notes (Signed)
Informed today by [name] that they will not be able to service patient.  Sent email to [facility] with [facility] to determine this agency can see her.

## 2020-09-17 NOTE — Progress Notes (Signed)
[facility] OFFICE PROGRESS NOTE   Diagnosis: TTP  INTERVAL HISTORY:   [name] returns as scheduled.  She is here today with her husband.  Her daughter is present by telephone.  She reports malaise.  She had bleeding at the left lower gumline this week.  She bruises easily.  No other bleeding.  No pain at the left neck catheter site.  She continues prednisone at a dose of 30 mg daily.  Objective:  Vital signs in last 24 hours:  Blood pressure (!) 132/55, pulse 100, temperature 97.8 F (36.6 C), temperature source Oral, resp. rate 18, height 5\' 4"  (1.626 m), weight 161 lb (73 kg), SpO2 93 %.    HEENT: No thrush or bleeding Resp: Distant breath sounds, scattered coarse rhonchi, no respiratory distress Cardio: Regular rate and rhythm, 2/6 diastolic murmur GI: No hepatosplenomegaly Vascular: Trace edema at the left greater than right lower leg and ankle  Skin: Few ecchymoses over the trunk and extremities  Portacath/PICC-without erythema  Lab Results:  Lab Results  Component Value Date   WBC 10.5 09/17/2020   HGB 11.5 (L) 09/17/2020   HCT 36.8 09/17/2020   MCV 100.8 (H) 09/17/2020   PLT 218 09/17/2020   NEUTROABS 8.3 (H) 09/17/2020    CMP  Lab Results  Component Value Date   NA 141 09/14/2020   K 4.4 09/14/2020   CL 104 09/14/2020   CO2 28 09/14/2020   GLUCOSE 108 (H) 09/14/2020   BUN 31 (H) 09/14/2020   CREATININE 0.99 09/14/2020   CALCIUM 8.7 (L) 09/14/2020   PROT 5.8 (L) 09/14/2020   ALBUMIN 3.6 09/14/2020   AST 29 09/14/2020   ALT 43 09/14/2020   ALKPHOS 54 09/14/2020   BILITOT 0.4 09/14/2020   GFRNONAA 58 (L) 09/14/2020   GFRAA 50 (L) 11/25/2019  AdamTS 13 activity on 09/10/2020- 23.6%  Medications: I have reviewed the patient's current medications.   Assessment/Plan: 1. TTP diagnosed in 2005, treated with plasma exchange, steroids, and rituximab consolidation ? 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 Relapse of TTP March 2015-plasma exchange, steroids, rituximab, and maintenance azathioprine ? Admission with severe thrombocytopenia and elevated LDH 07/16/2020, ADAMTS13 activity less than 2%,consistent with relapse of TTP -Steroids/FFP given 07/16/2020 -Daily plasma exchange beginning 07/17/2020, 7 days, then qod starting 3/24, discharge 07/21/2020 on prednisone 60 mg daily -Prednisone taper to 40 mg daily 07/29/2020 -rituximab 07/30/2020, 08/06/2020, 08/13/2020, 08/20/2020 -plasmapheresis on 07/31/2020, 08/03/2020, 08/05/2020, and 08/07/2020 -prednisone increased to 60 mg daily 08/17/2020 -Plasmapheresis 08/24/2020, 08/25/2020, 08/26/2020, 08/27/2020, 08/28/2020, 08/29/2020, 08/31/2020, 09/02/2020, 09/04/2020 -caplacizumabinitiated 08/24/2020       -Decrease prednisone to 40 mg daily 08/28/2020  -ADAMTS13 37% on 09/04/2020  -Prednisone decreased to 30 mg daily 09/07/2020  -Continue caplacizumab       -prednisone decreased to 20 mg daily beginning 09/18/2020 2. History of epidural abscess requiring laminectomy 2012 3. 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 COPD      Disposition: [name] appears unchanged.  The platelet count remains normal.  The LDH remains elevated and the ADAMTS13 activity is low.  The plan is to continue caplacizumab.  The prednisone will be tapered to 20 mg daily.  She would like to keep the pheresis catheter in place for now.  She will return for a lab visit and catheter flush on 09/21/2020.  She will be scheduled for an office visit in 1 week.  [name] will call for new symptoms.  Betsy Coder, MD  09/17/2020  12:28 PM

## 2020-09-21 ENCOUNTER — Other Ambulatory Visit: Payer: Self-pay

## 2020-09-21 ENCOUNTER — Inpatient Hospital Stay: Payer: Medicare Other

## 2020-09-21 DIAGNOSIS — D62 Acute posthemorrhagic anemia: Secondary | ICD-10-CM | POA: Diagnosis not present

## 2020-09-21 DIAGNOSIS — M3119 Other thrombotic microangiopathy: Secondary | ICD-10-CM

## 2020-09-21 LAB — CBC WITH DIFFERENTIAL (CANCER CENTER ONLY)
Abs Immature Granulocytes: 0.25 10*3/uL — ABNORMAL HIGH (ref 0.00–0.07)
Basophils Absolute: 0.1 10*3/uL (ref 0.0–0.1)
Basophils Relative: 1 %
Eosinophils Absolute: 0.1 10*3/uL (ref 0.0–0.5)
Eosinophils Relative: 1 %
HCT: 31.8 % — ABNORMAL LOW (ref 36.0–46.0)
Hemoglobin: 10.2 g/dL — ABNORMAL LOW (ref 12.0–15.0)
Immature Granulocytes: 3 %
Lymphocytes Relative: 29 %
Lymphs Abs: 2.5 10*3/uL (ref 0.7–4.0)
MCH: 32 pg (ref 26.0–34.0)
MCHC: 32.1 g/dL (ref 30.0–36.0)
MCV: 99.7 fL (ref 80.0–100.0)
Monocytes Absolute: 0.7 10*3/uL (ref 0.1–1.0)
Monocytes Relative: 8 %
Neutro Abs: 5 10*3/uL (ref 1.7–7.7)
Neutrophils Relative %: 58 %
Platelet Count: 249 10*3/uL (ref 150–400)
RBC: 3.19 MIL/uL — ABNORMAL LOW (ref 3.87–5.11)
RDW: 15.5 % (ref 11.5–15.5)
WBC Count: 8.6 10*3/uL (ref 4.0–10.5)
nRBC: 1.3 % — ABNORMAL HIGH (ref 0.0–0.2)

## 2020-09-21 LAB — CMP (CANCER CENTER ONLY)
ALT: 28 U/L (ref 0–44)
AST: 22 U/L (ref 15–41)
Albumin: 3.5 g/dL (ref 3.5–5.0)
Alkaline Phosphatase: 37 U/L — ABNORMAL LOW (ref 38–126)
Anion gap: 7 (ref 5–15)
BUN: 30 mg/dL — ABNORMAL HIGH (ref 8–23)
CO2: 28 mmol/L (ref 22–32)
Calcium: 8.9 mg/dL (ref 8.9–10.3)
Chloride: 105 mmol/L (ref 98–111)
Creatinine: 0.93 mg/dL (ref 0.44–1.00)
GFR, Estimated: >60 mL/min (ref >60)
Glucose, Bld: 97 mg/dL (ref 70–99)
Potassium: 4.2 mmol/L (ref 3.5–5.1)
Sodium: 140 mmol/L (ref 135–145)
Total Bilirubin: 0.4 mg/dL (ref 0.3–1.2)
Total Protein: 6.1 g/dL — ABNORMAL LOW (ref 6.5–8.1)

## 2020-09-21 LAB — LACTATE DEHYDROGENASE: LDH: 221 U/L — ABNORMAL HIGH (ref 98–192)

## 2020-09-23 ENCOUNTER — Telehealth: Payer: Self-pay | Admitting: *Deleted

## 2020-09-23 ENCOUNTER — Other Ambulatory Visit: Payer: Self-pay | Admitting: Nurse Practitioner

## 2020-09-23 ENCOUNTER — Other Ambulatory Visit: Payer: Self-pay

## 2020-09-23 ENCOUNTER — Emergency Department (HOSPITAL_COMMUNITY)
Admission: EM | Admit: 2020-09-23 | Discharge: 2020-09-23 | Disposition: A | Discharge status: Home / Self Care | Payer: Medicare Other | Source: Home / Self Care | Attending: Emergency Medicine | Admitting: Emergency Medicine

## 2020-09-23 ENCOUNTER — Telehealth: Payer: Self-pay | Admitting: Nurse Practitioner

## 2020-09-23 ENCOUNTER — Emergency Department (HOSPITAL_COMMUNITY): Payer: Medicare Other

## 2020-09-23 DIAGNOSIS — R5381 Other malaise: Secondary | ICD-10-CM | POA: Insufficient documentation

## 2020-09-23 DIAGNOSIS — J449 Chronic obstructive pulmonary disease, unspecified: Secondary | ICD-10-CM | POA: Insufficient documentation

## 2020-09-23 DIAGNOSIS — Z79899 Other long term (current) drug therapy: Secondary | ICD-10-CM | POA: Insufficient documentation

## 2020-09-23 DIAGNOSIS — M3119 Other thrombotic microangiopathy: Secondary | ICD-10-CM

## 2020-09-23 DIAGNOSIS — Z20822 Contact with and (suspected) exposure to covid-19: Secondary | ICD-10-CM | POA: Insufficient documentation

## 2020-09-23 DIAGNOSIS — Z87891 Personal history of nicotine dependence: Secondary | ICD-10-CM | POA: Insufficient documentation

## 2020-09-23 DIAGNOSIS — R531 Weakness: Secondary | ICD-10-CM | POA: Insufficient documentation

## 2020-09-23 LAB — CBC WITH DIFFERENTIAL/PLATELET
Abs Immature Granulocytes: 0.16 10*3/uL — ABNORMAL HIGH (ref 0.00–0.07)
Basophils Absolute: 0 10*3/uL (ref 0.0–0.1)
Basophils Relative: 1 %
Eosinophils Absolute: 0 10*3/uL (ref 0.0–0.5)
Eosinophils Relative: 1 %
HCT: 29.7 % — ABNORMAL LOW (ref 36.0–46.0)
Hemoglobin: 9.2 g/dL — ABNORMAL LOW (ref 12.0–15.0)
Immature Granulocytes: 2 %
Lymphocytes Relative: 27 %
Lymphs Abs: 2.1 10*3/uL (ref 0.7–4.0)
MCH: 32.6 pg (ref 26.0–34.0)
MCHC: 31 g/dL (ref 30.0–36.0)
MCV: 105.3 fL — ABNORMAL HIGH (ref 80.0–100.0)
Monocytes Absolute: 0.8 10*3/uL (ref 0.1–1.0)
Monocytes Relative: 10 %
Neutro Abs: 4.8 10*3/uL (ref 1.7–7.7)
Neutrophils Relative %: 59 %
Platelets: 233 10*3/uL (ref 150–400)
RBC: 2.82 MIL/uL — ABNORMAL LOW (ref 3.87–5.11)
RDW: 15.9 % — ABNORMAL HIGH (ref 11.5–15.5)
WBC: 8 10*3/uL (ref 4.0–10.5)
nRBC: 0.6 % — ABNORMAL HIGH (ref 0.0–0.2)

## 2020-09-23 LAB — COMPREHENSIVE METABOLIC PANEL
ALT: 38 U/L (ref 0–44)
AST: 30 U/L (ref 15–41)
Albumin: 3.1 g/dL — ABNORMAL LOW (ref 3.5–5.0)
Alkaline Phosphatase: 37 U/L — ABNORMAL LOW (ref 38–126)
Anion gap: 4 — ABNORMAL LOW (ref 5–15)
BUN: 25 mg/dL — ABNORMAL HIGH (ref 8–23)
CO2: 26 mmol/L (ref 22–32)
Calcium: 8.7 mg/dL — ABNORMAL LOW (ref 8.9–10.3)
Chloride: 109 mmol/L (ref 98–111)
Creatinine, Ser: 0.84 mg/dL (ref 0.44–1.00)
GFR, Estimated: >60 mL/min (ref >60)
Glucose, Bld: 104 mg/dL — ABNORMAL HIGH (ref 70–99)
Potassium: 3.9 mmol/L (ref 3.5–5.1)
Sodium: 139 mmol/L (ref 135–145)
Total Bilirubin: 0.6 mg/dL (ref 0.3–1.2)
Total Protein: 5.6 g/dL — ABNORMAL LOW (ref 6.5–8.1)

## 2020-09-23 LAB — LIPASE, BLOOD: Lipase: 35 U/L (ref 11–51)

## 2020-09-23 LAB — LACTIC ACID, PLASMA: Lactic Acid, Venous: 1.2 mmol/L (ref 0.5–1.9)

## 2020-09-23 LAB — LACTATE DEHYDROGENASE: LDH: 214 U/L — ABNORMAL HIGH (ref 98–192)

## 2020-09-23 LAB — FIBRINOGEN: Fibrinogen: 449 mg/dL (ref 210–475)

## 2020-09-23 LAB — RETICULOCYTES
Immature Retic Fract: 37.6 % — ABNORMAL HIGH (ref 2.3–15.9)
RBC.: 2.84 MIL/uL — ABNORMAL LOW (ref 3.87–5.11)
Retic Count, Absolute: 235.4 10*3/uL — ABNORMAL HIGH (ref 19.0–186.0)
Retic Ct Pct: 8.3 % — ABNORMAL HIGH (ref 0.4–3.1)

## 2020-09-23 LAB — SARS CORONAVIRUS 2 (TAT 6-24 HRS): SARS Coronavirus 2: NEGATIVE

## 2020-09-23 LAB — ETHANOL: Alcohol, Ethyl (B): <10 mg/dL (ref <10)

## 2020-09-23 MED ORDER — SODIUM CHLORIDE 0.9 % IV BOLUS
500.0000 mL | Freq: Once | INTRAVENOUS | Status: AC
Start: 2020-09-23 — End: 2020-09-23
  Administered 2020-09-23: 500 mL via INTRAVENOUS

## 2020-09-23 NOTE — Telephone Encounter (Signed)
I spoke with [name]'s daughter, [name].  She reports [name] was discharged from the emergency room.  Further reports [name] is very weak.  We offered an appointment tomorrow morning.  They will do the best they can to get her here before lunch but due to her weakness and the distance they live from our office it is difficult.  She understands to seek emergency evaluation if she becomes unstable/condition deteriorates overnight.  She inquired about a direct admission.  I told her the best course of action would be to go back to the emergency room as the ER physician's note from today indicates admission was recommended.

## 2020-09-23 NOTE — Telephone Encounter (Signed)
Daughter, Gwinda Passe called to report patient has been in bed for 78 hours and so weak she can't get OOB to ambulate to toilet. Has been incontinent of urine. Won't eat. Has not given her the Prednisone or Cablivi for 48 hours. Called EMS today and she is now at [facility] emergency room.  MD notified.

## 2020-09-23 NOTE — Telephone Encounter (Signed)
Message from [employer] to inquire if caplaxiqumab/Cablivi will continue another month? If so, please send new order to fax [phone] or call with new script and results of last ADMATS13 Activity test.

## 2020-09-23 NOTE — Discharge Instructions (Addendum)
As discussed, with your weakness and anemia is very important that you follow-up with your physicians for appropriate ongoing management.  Do not hesitate to return here if you develop new, or concerning changes at all.

## 2020-09-23 NOTE — ED Triage Notes (Addendum)
PT BIB [facility] for generalized weakness associated with hx of TTP and a "flushing" that occurred on Monday. Chart states that she receives infusions. Pt also complains of right hip pain.  Last oral intake was some soup last  Night. Pt has a catheter in her left IJ.  It is described as a triple lumen hemodialysis catheter in chart.

## 2020-09-23 NOTE — ED Provider Notes (Signed)
[facility] EMERGENCY DEPARTMENT Provider Note   CSN: [id number] Arrival date & time: 09/23/20  1194     History Chief Complaint  Patient presents with  . Weakness    [name] is a 80 y.o. female.  HPI Patient with multiple medical issues including TTP presents initially alone, but then with her husband who assists with the history. She presents due to worsening generalized weakness, fatigue, malaise. She has no focal pain, dyspnea, but notes that over the past week she has had progressive decline in functionality.  Husband notes the patient is now sleeping up to 20 hours a day, has minimal capacity to perform ADL.  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  She has had episodes with relapses going back 17 years, and is currently undergoing plasma exchange.  She also recently started a new monoclonal antibody therapy. No reported fever, fall, vomiting, diarrhea.  Patient has had hip pain in the past, but none currently. Additional details on chart review from the patient's oncologist, with assessment plan as below: Assessment/Plan: 1. TTP diagnosed in 2005, treated with plasma exchange, steroids, and rituximab consolidation ? 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 Relapse of TTP March 2015-plasma exchange, steroids, rituximab, and maintenance azathioprine ? Admission with severe thrombocytopenia and elevated LDH 07/16/2020, ADAMTS13 activity less than 2%, consistent with relapse of TTP       -Steroids/FFP given 07/16/2020       -Daily plasma exchange beginning 07/17/2020, 7 days, then qod starting 3/24, discharge 07/21/2020 on prednisone 60 mg daily       -Prednisone taper to 40 mg daily 07/29/2020       -rituximab 07/30/2020, 08/06/2020, 08/13/2020, 08/20/2020       -plasmapheresis on 07/31/2020, 08/03/2020, 08/05/2020, and 08/07/2020       -prednisone increased to 60 mg daily 08/17/2020       -Plasmapheresis 08/24/2020, 08/25/2020,  08/26/2020, 08/27/2020, 08/28/2020, 08/29/2020, 08/31/2020, 09/02/2020, 09/04/2020       -caplacizumab initiated 08/24/2020       -Decrease prednisone to 40 mg daily 08/28/2020       -ADAMTS13 37% on 09/04/2020       -Prednisone decreased to 30 mg daily 09/07/2020       -Continue caplacizumab       -prednisone decreased to 20 mg daily beginning 09/18/2020 2. History of epidural abscess requiring laminectomy 2012 3. 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 COPD     Past Medical History:  Diagnosis Date  . Abnormal laboratory test 09/10/2013   Persistent elevation LDH  . Acute delirium   . Anemia   . Arthritis   . Bacteremia 03/2011  . Blood dyscrasia   . Blood transfusion   . Bronchitis, chronic obstructive w acute bronchitis ([place]) 08/12/2013  . Chronic back pain   . COPD (chronic obstructive pulmonary disease) ([place])   . Depression    "mild"  . 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 History of TTP (thrombotic thrombocytopenic purpura)   . Hypokalemia 08/08/2013   Steroid related  . Normal echocardiogram 03/15/11  . Oral thrush 08/08/2013  . Septicemia 03/2011  . Spinal stenosis, lumbar   . Tobacco abuse 05/10/2013    Patient Active Problem List   Diagnosis Date Noted  . Chronic pain disorder 07/16/2020  . Mood disorder ([place]) 07/16/2020  . Abnormal laboratory test 09/10/2013  .  Pulmonary edema 08/19/2013  . Bronchitis, chronic obstructive w acute bronchitis ([place]) 08/12/2013  . Fever 08/12/2013  . Oral thrush 08/08/2013  . Hypokalemia 08/08/2013  . History of plasmapheresis 08/08/2013  . 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 Tobacco abuse 05/10/2013  . Enterococcal sepsis ([place]) 06/13/2011  . Endocarditis, bacterial, acute/subacute 06/13/2011  . Gait instability 06/13/2011  . 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 Sepsis(995.91) 03/16/2011  . History of TTP  (thrombotic thrombocytopenic purpura) 03/16/2011  . COPD (chronic obstructive pulmonary disease) ([place]) 03/16/2011    Past Surgical History:  Procedure Laterality Date  . CATARACT EXTRACTION W/ INTRAOCULAR LENS  IMPLANT, BILATERAL  ~ 2010  . CESAREAN SECTION  10/09/1976  . EYE SURGERY    . INSERTION OF DIALYSIS CATHETER Left 07/12/2013   Procedure: INSERTION OF DIALYSIS CATHETER   with Ultrasound;  Surgeon: Rosetta Posner, MD;  Location: [facility] OR;  Service: Vascular;  Laterality: Left;  . IR PERC TUN PERIT CATH WO PORT S&I Dartha Lodge  07/16/2020  . IR US GUIDE VASC ACCESS LEFT  07/16/2020  . LUMBAR LAMINECTOMY/DECOMPRESSION MICRODISCECTOMY  03/16/2011   Procedure: LUMBAR LAMINECTOMY/DECOMPRESSION MICRODISCECTOMY;  Surgeon: Elaina Hoops;  Location: [facility]UR[facility] ORS;  Service: Neurosurgery;  Laterality: N/A;  . TEE WITHOUT CARDIOVERSION  03/21/2011   Procedure: TRANSESOPHAGEAL ECHOCARDIOGRAM (TEE);  Surgeon: Laverda Page;  Location: [facility] ENDOSCOPY;  Service: Cardiovascular;  Laterality: N/A;  TEE for vegetations  . TONSILLECTOMY    . TUBAL LIGATION       OB History   No obstetric history on file.     No family history on file.  Social History   Tobacco Use  . Smoking status: Former Smoker    Years: 55.00    Types: E-cigarettes  . Smokeless tobacco: Never Used  . Tobacco comment: Vapors  Substance Use Topics  . Alcohol use: Yes    Alcohol/week: 7.0 standard drinks    Types: 7 Cans of beer per week    Comment: Beer a night.  . Drug use: No    Home Medications Prior to Admission medications   Medication Sig Start Date End Date Taking? Authorizing Provider  ascorbic acid (VITAMIN C) 500 MG tablet Take 500 mg by mouth daily.    [provider]  azaTHIOprine (IMURAN) 50 MG tablet Take 2 tablets by mouth daily. 07/31/20   [provider]  caplacizumab (CABLIVI) 11 MG KIT injection Inject 11 mg into the skin daily. 08/31/20   [name], NP  cholecalciferol (VITAMIN D) 400  UNITS TABS Take 400 Units by mouth daily.    [provider]  cyanocobalamin 1000 MCG tablet Take 1,000 mcg by mouth daily.    [provider]  FLUoxetine (PROZAC) 20 MG capsule Take 20 mg by mouth daily.    Kirk Ruths, MD  folic acid (FOLVITE) 1 MG tablet Take 1 mg by mouth daily.    [provider]  gabapentin (NEURONTIN) 600 MG tablet Take 600 mg by mouth 3 (three) times daily.    [provider]  Multiple Vitamin (MULTIVITAMIN) tablet Take 1 tablet by mouth daily.     [provider]  Nicotine (NICODERM CQ TD) Place 1 patch onto the skin See admin instructions. Apply 1 patch onto the skin daily- remove as directed- ONLY WHEN HOSPITALIZED    [provider]  polyethylene glycol (MIRALAX / GLYCOLAX) packet Take 17 g by mouth daily. Patient taking differently: Take 17 g by mouth daily as needed for mild  constipation (MIX AND DRINK). 04/08/17   Earleen Newport, MD  predniSONE (DELTASONE) 10 MG tablet Take 3 tablets (30 mg total) by mouth daily with breakfast. 09/07/20   [name], NP  QUEtiapine (SEROQUEL) 25 MG tablet Take 25 mg by mouth at bedtime.    [provider]  rosuvastatin (CRESTOR) 20 MG tablet Take 20 mg by mouth at bedtime. 05/22/20   [provider]  tiotropium (SPIRIVA) 18 MCG inhalation capsule Place 18 mcg into inhaler and inhale daily.    [provider]  traMADol (ULTRAM) 50 MG tablet Take 1 tablet (50 mg total) by mouth every 6 (six) hours as needed. Patient taking differently: Take 50 mg by mouth 2 (two) times daily. 02/25/18   Versie Starks, PA-C    Allergies    Adhesive [tape], Cefuroxime axetil, Other, Sulfa antibiotics, and Sulfa drugs cross reactors  Review of Systems   Review of Systems  Constitutional:       Per HPI, otherwise negative  HENT:       Per HPI, otherwise negative  Respiratory:       Per HPI, otherwise negative  Cardiovascular:       Per HPI,  otherwise negative  Gastrointestinal: Negative for vomiting.  Endocrine:       Negative aside from HPI  Genitourinary:       Neg aside from HPI   Musculoskeletal:       Per HPI, otherwise negative  Skin: Negative.   Neurological: Positive for weakness. Negative for syncope.    Physical Exam Updated Vital Signs BP (!) 131/56   Pulse 94   Temp 98.7 F (37.1 C) (Oral)   Resp 20   SpO2 95%   Physical Exam Vitals and nursing note reviewed.  Constitutional:      General: She is not in acute distress.    Appearance: She is well-developed.  HENT:     Head: Normocephalic and atraumatic.  Eyes:     Conjunctiva/sclera: Conjunctivae normal.  Neck:     Comments: Left plasmapheresis catheter in place no surrounding erythema, bleeding, discharge Cardiovascular:     Rate and Rhythm: Normal rate and regular rhythm.  Pulmonary:     Effort: Pulmonary effort is normal. No respiratory distress.     Breath sounds: Normal breath sounds. No stridor.  Abdominal:     General: There is no distension.     Tenderness: There is no abdominal tenderness. There is no guarding.  Musculoskeletal:        General: No deformity.  Skin:    General: Skin is warm and dry.  Neurological:     Mental Status: She is alert and oriented to person, place, and time.     Cranial Nerves: No cranial nerve deficit.  Psychiatric:     Comments: Withdrawn, but appropriate     ED Results / Procedures / Treatments   Labs (all labs ordered are listed, but only abnormal results are displayed) Labs Reviewed  COMPREHENSIVE METABOLIC PANEL - Abnormal; Notable for the following components:      Result Value   Glucose, Bld 104 (*)    BUN 25 (*)    Calcium 8.7 (*)    Total Protein 5.6 (*)    Albumin 3.1 (*)    Alkaline Phosphatase 37 (*)    Anion gap 4 (*)    All other components within normal limits  CBC WITH DIFFERENTIAL/PLATELET - Abnormal; Notable for the following components:   RBC 2.82 (*)  Hemoglobin 9.2  (*)    HCT 29.7 (*)    MCV 105.3 (*)    RDW 15.9 (*)    nRBC 0.6 (*)    Abs Immature Granulocytes 0.16 (*)    All other components within normal limits  LACTATE DEHYDROGENASE - Abnormal; Notable for the following components:   LDH 214 (*)    All other components within normal limits  RETICULOCYTES - Abnormal; Notable for the following components:   Retic Ct Pct 8.3 (*)    RBC. 2.84 (*)    Retic Count, Absolute 235.4 (*)    Immature Retic Fract 37.6 (*)    All other components within normal limits  SARS CORONAVIRUS 2 (TAT 6-24 HRS)  ETHANOL  LIPASE, BLOOD  LACTIC ACID, PLASMA  FIBRINOGEN  LACTIC ACID, PLASMA  HAPTOGLOBIN  SAVE SMEAR (SSMR)    EKG None  Radiology DG Chest Port 1 View  Result Date: 09/23/2020 CLINICAL DATA:  Weakness EXAM: PORTABLE CHEST 1 VIEW COMPARISON:  Prior chest x-ray 08/20/2013 FINDINGS: Left IJ approach hemodialysis catheter. The catheter tip projects over the mid SVC. Stable cardiac and mediastinal contours. No pleural effusion or pulmonary edema. No evidence of airspace opacity or pneumothorax. No acute osseous abnormality. IMPRESSION: No active disease. Left IJ hemodialysis catheter.  Catheter tip overlies the mid SVC. Electronically Signed   By: Jacqulynn Cadet M.D.   On: 09/23/2020 11:11    Procedures Procedures   Medications Ordered in ED Medications  sodium chloride 0.9 % bolus 500 mL (0 mLs Intravenous Stopped 09/23/20 1237)    ED Course  I have reviewed the triage vital signs and the nursing notes.  Pertinent labs & imaging results that were available during my care of the patient were reviewed by me and considered in my medical decision making (see chart for details).   Update:, Patient in similar condition  I discussed the patient's case with her hematologist.  With consideration of the patient's TTP, new monoclonal antibody therapy, history of relapsing, remitting disease, we discussed labs.  Given some consideration of hemolysis,  reticulocyte count added to her initial labs.  Update: I discussed initial results with the patient and her husband.  I discussed recommendation for admission given her weakness, though her initial studies are most notable for anemia, drop hemoglobin 11.5, now 9.2.  Patient has strong preference for discharge, states that she does not want to stay in the facility any longer, wants to follow-up with her oncologist.  Patient has capacity to make this request.  4:03 PM Patient has departed, but I discussed her case again with her oncologist.  Patient is scheduled for follow-up tomorrow.  We discussed results again.   L female with ongoing therapy for TTP presents with weakness.  Patient is found to have anemia, but seemingly has appropriate reticulocyte evidence for attempted compensation.  Symptoms may be secondary to new medication reaction versus symptomatic anemia.  No evidence for obvious source of bleeding.  No evidence for infection, though occult processes remain a consideration. Patient requested discharge with close outpatient follow-up, and this was accommodated. MDM Rules/Calculators/A&P MDM Number of Diagnoses or Management Options Weakness: new, needed workup   Amount and/or Complexity of Data Reviewed Clinical lab tests: ordered and reviewed Tests in the radiology section of CPT: ordered and reviewed Tests in the medicine section of CPT: reviewed and ordered Decide to obtain previous medical records or to obtain history from someone other than the patient: yes Obtain history from someone other than the patient:  yes Review and summarize past medical records: yes Discuss the patient with other providers: yes Independent visualization of images, tracings, or specimens: yes  Risk of Complications, Morbidity, and/or Mortality Presenting problems: high Diagnostic procedures: high Management options: high  Critical Care Total time providing critical care: < 30 minutes  Patient  Progress Patient progress: stable   Final Clinical Impression(s) / ED Diagnoses Final diagnoses:  Weakness     Carmin Muskrat, MD 09/23/20 1605

## 2020-09-24 ENCOUNTER — Inpatient Hospital Stay: Payer: Medicare Other

## 2020-09-24 ENCOUNTER — Encounter (HOSPITAL_COMMUNITY): Payer: Self-pay | Admitting: Family Medicine

## 2020-09-24 ENCOUNTER — Inpatient Hospital Stay (HOSPITAL_COMMUNITY): Payer: Medicare Other

## 2020-09-24 ENCOUNTER — Inpatient Hospital Stay (HOSPITAL_BASED_OUTPATIENT_CLINIC_OR_DEPARTMENT_OTHER): Payer: Medicare Other | Admitting: Oncology

## 2020-09-24 ENCOUNTER — Inpatient Hospital Stay: Payer: Medicare Other | Admitting: Nurse Practitioner

## 2020-09-24 ENCOUNTER — Inpatient Hospital Stay (HOSPITAL_BASED_OUTPATIENT_CLINIC_OR_DEPARTMENT_OTHER): Payer: Medicare Other | Admitting: Nurse Practitioner

## 2020-09-24 ENCOUNTER — Inpatient Hospital Stay (HOSPITAL_COMMUNITY)
Admission: AD | Admit: 2020-09-24 | Discharge: 2020-09-27 | DRG: 811 | Disposition: A | Discharge status: Home / Self Care | Payer: Medicare Other | Source: Ambulatory Visit | Attending: Family Medicine | Admitting: Family Medicine

## 2020-09-24 ENCOUNTER — Encounter: Payer: Self-pay | Admitting: Nurse Practitioner

## 2020-09-24 VITALS — BP 117/50 | HR 109 | Temp 97.7°F | Resp 18 | Ht 64.0 in

## 2020-09-24 DIAGNOSIS — T380X5A Adverse effect of glucocorticoids and synthetic analogues, initial encounter: Secondary | ICD-10-CM | POA: Diagnosis present

## 2020-09-24 DIAGNOSIS — M3119 Other thrombotic microangiopathy: Secondary | ICD-10-CM

## 2020-09-24 DIAGNOSIS — Z20822 Contact with and (suspected) exposure to covid-19: Secondary | ICD-10-CM | POA: Diagnosis present

## 2020-09-24 DIAGNOSIS — T50995A Adverse effect of other drugs, medicaments and biological substances, initial encounter: Secondary | ICD-10-CM | POA: Diagnosis present

## 2020-09-24 DIAGNOSIS — D500 Iron deficiency anemia secondary to blood loss (chronic): Secondary | ICD-10-CM | POA: Diagnosis not present

## 2020-09-24 DIAGNOSIS — D62 Acute posthemorrhagic anemia: Principal | ICD-10-CM | POA: Diagnosis present

## 2020-09-24 DIAGNOSIS — K922 Gastrointestinal hemorrhage, unspecified: Secondary | ICD-10-CM | POA: Diagnosis present

## 2020-09-24 DIAGNOSIS — Z888 Allergy status to other drugs, medicaments and biological substances status: Secondary | ICD-10-CM

## 2020-09-24 DIAGNOSIS — Z961 Presence of intraocular lens: Secondary | ICD-10-CM | POA: Diagnosis present

## 2020-09-24 DIAGNOSIS — K449 Diaphragmatic hernia without obstruction or gangrene: Secondary | ICD-10-CM | POA: Diagnosis present

## 2020-09-24 DIAGNOSIS — F17200 Nicotine dependence, unspecified, uncomplicated: Secondary | ICD-10-CM | POA: Diagnosis present

## 2020-09-24 DIAGNOSIS — J449 Chronic obstructive pulmonary disease, unspecified: Secondary | ICD-10-CM | POA: Diagnosis present

## 2020-09-24 DIAGNOSIS — Z882 Allergy status to sulfonamides status: Secondary | ICD-10-CM

## 2020-09-24 DIAGNOSIS — N281 Cyst of kidney, acquired: Secondary | ICD-10-CM | POA: Diagnosis present

## 2020-09-24 DIAGNOSIS — Y929 Unspecified place or not applicable: Secondary | ICD-10-CM | POA: Diagnosis not present

## 2020-09-24 DIAGNOSIS — F32A Depression, unspecified: Secondary | ICD-10-CM | POA: Diagnosis present

## 2020-09-24 DIAGNOSIS — R2681 Unsteadiness on feet: Secondary | ICD-10-CM | POA: Diagnosis present

## 2020-09-24 DIAGNOSIS — R262 Difficulty in walking, not elsewhere classified: Secondary | ICD-10-CM | POA: Diagnosis present

## 2020-09-24 DIAGNOSIS — Z87891 Personal history of nicotine dependence: Secondary | ICD-10-CM

## 2020-09-24 DIAGNOSIS — Z7952 Long term (current) use of systemic steroids: Secondary | ICD-10-CM | POA: Diagnosis not present

## 2020-09-24 DIAGNOSIS — J441 Chronic obstructive pulmonary disease with (acute) exacerbation: Secondary | ICD-10-CM | POA: Diagnosis present

## 2020-09-24 DIAGNOSIS — M549 Dorsalgia, unspecified: Secondary | ICD-10-CM | POA: Diagnosis present

## 2020-09-24 DIAGNOSIS — N949 Unspecified condition associated with female genital organs and menstrual cycle: Secondary | ICD-10-CM

## 2020-09-24 DIAGNOSIS — Z862 Personal history of diseases of the blood and blood-forming organs and certain disorders involving the immune mechanism: Secondary | ICD-10-CM

## 2020-09-24 DIAGNOSIS — K222 Esophageal obstruction: Secondary | ICD-10-CM | POA: Diagnosis present

## 2020-09-24 DIAGNOSIS — Z9842 Cataract extraction status, left eye: Secondary | ICD-10-CM

## 2020-09-24 DIAGNOSIS — Z9841 Cataract extraction status, right eye: Secondary | ICD-10-CM | POA: Diagnosis not present

## 2020-09-24 DIAGNOSIS — Z720 Tobacco use: Secondary | ICD-10-CM | POA: Diagnosis present

## 2020-09-24 DIAGNOSIS — G894 Chronic pain syndrome: Secondary | ICD-10-CM | POA: Diagnosis present

## 2020-09-24 DIAGNOSIS — I700 Atherosclerosis of aorta: Secondary | ICD-10-CM | POA: Diagnosis present

## 2020-09-24 DIAGNOSIS — J189 Pneumonia, unspecified organism: Secondary | ICD-10-CM

## 2020-09-24 LAB — CMP (CANCER CENTER ONLY)
ALT: 37 U/L (ref 0–44)
AST: 25 U/L (ref 15–41)
Albumin: 3.6 g/dL (ref 3.5–5.0)
Alkaline Phosphatase: 41 U/L (ref 38–126)
Anion gap: 9 (ref 5–15)
BUN: 24 mg/dL — ABNORMAL HIGH (ref 8–23)
CO2: 25 mmol/L (ref 22–32)
Calcium: 8.3 mg/dL — ABNORMAL LOW (ref 8.9–10.3)
Chloride: 106 mmol/L (ref 98–111)
Creatinine: 0.73 mg/dL (ref 0.44–1.00)
GFR, Estimated: >60 mL/min (ref >60)
Glucose, Bld: 102 mg/dL — ABNORMAL HIGH (ref 70–99)
Potassium: 3.9 mmol/L (ref 3.5–5.1)
Sodium: 140 mmol/L (ref 135–145)
Total Bilirubin: 0.5 mg/dL (ref 0.3–1.2)
Total Protein: 5.9 g/dL — ABNORMAL LOW (ref 6.5–8.1)

## 2020-09-24 LAB — CBC WITH DIFFERENTIAL (CANCER CENTER ONLY)
Abs Immature Granulocytes: 0.15 10*3/uL — ABNORMAL HIGH (ref 0.00–0.07)
Basophils Absolute: 0.1 10*3/uL (ref 0.0–0.1)
Basophils Relative: 1 %
Eosinophils Absolute: 0.1 10*3/uL (ref 0.0–0.5)
Eosinophils Relative: 1 %
HCT: 28.7 % — ABNORMAL LOW (ref 36.0–46.0)
Hemoglobin: 8.9 g/dL — ABNORMAL LOW (ref 12.0–15.0)
Immature Granulocytes: 2 %
Lymphocytes Relative: 25 %
Lymphs Abs: 2 10*3/uL (ref 0.7–4.0)
MCH: 31.9 pg (ref 26.0–34.0)
MCHC: 31 g/dL (ref 30.0–36.0)
MCV: 102.9 fL — ABNORMAL HIGH (ref 80.0–100.0)
Monocytes Absolute: 0.8 10*3/uL (ref 0.1–1.0)
Monocytes Relative: 9 %
Neutro Abs: 5.1 10*3/uL (ref 1.7–7.7)
Neutrophils Relative %: 62 %
Platelet Count: 251 10*3/uL (ref 150–400)
RBC: 2.79 MIL/uL — ABNORMAL LOW (ref 3.87–5.11)
RDW: 16.2 % — ABNORMAL HIGH (ref 11.5–15.5)
WBC Count: 8.1 10*3/uL (ref 4.0–10.5)
nRBC: 0.5 % — ABNORMAL HIGH (ref 0.0–0.2)

## 2020-09-24 LAB — DIRECT ANTIGLOBULIN TEST (NOT AT ARMC)
DAT, IgG: NEGATIVE
DAT, complement: NEGATIVE

## 2020-09-24 LAB — RETIC PANEL
Immature Retic Fract: 36.7 % — ABNORMAL HIGH (ref 2.3–15.9)
RBC.: 2.77 MIL/uL — ABNORMAL LOW (ref 3.87–5.11)
Retic Count, Absolute: 241.5 10*3/uL — ABNORMAL HIGH (ref 19.0–186.0)
Retic Ct Pct: 8.7 % — ABNORMAL HIGH (ref 0.4–3.1)
Reticulocyte Hemoglobin: 30.7 pg (ref >27.9)

## 2020-09-24 LAB — LACTATE DEHYDROGENASE: LDH: 230 U/L — ABNORMAL HIGH (ref 98–192)

## 2020-09-24 LAB — SAMPLE TO BLOOD BANK

## 2020-09-24 LAB — HEMOGLOBIN AND HEMATOCRIT, BLOOD
HCT: 24.6 % — ABNORMAL LOW (ref 36.0–46.0)
Hemoglobin: 7.8 g/dL — ABNORMAL LOW (ref 12.0–15.0)

## 2020-09-24 LAB — HAPTOGLOBIN: Haptoglobin: 214 mg/dL (ref 42–346)

## 2020-09-24 LAB — SAVE SMEAR(SSMR), FOR PROVIDER SLIDE REVIEW

## 2020-09-24 MED ORDER — IOHEXOL 9 MG/ML PO SOLN
ORAL | Status: AC
  Filled 2020-09-24: qty 1000

## 2020-09-24 MED ORDER — TIOTROPIUM BROMIDE MONOHYDRATE 18 MCG IN CAPS: 18.0000 ug | ORAL_CAPSULE | Freq: Every day | RESPIRATORY_TRACT | Status: DC

## 2020-09-24 MED ORDER — ONDANSETRON HCL 4 MG PO TABS: 4.0000 mg | ORAL_TABLET | Freq: Four times a day (QID) | ORAL | Status: DC | PRN

## 2020-09-24 MED ORDER — GABAPENTIN 300 MG PO CAPS
600.0000 mg | ORAL_CAPSULE | Freq: Three times a day (TID) | ORAL | Status: DC
  Administered 2020-09-24 – 2020-09-27 (×8): 600 mg via ORAL
  Filled 2020-09-24 (×8): qty 2

## 2020-09-24 MED ORDER — ACETAMINOPHEN 650 MG RE SUPP: 650.0000 mg | Freq: Four times a day (QID) | RECTAL | Status: DC | PRN

## 2020-09-24 MED ORDER — QUETIAPINE FUMARATE 25 MG PO TABS
25.0000 mg | ORAL_TABLET | Freq: Every day | ORAL | Status: DC
  Administered 2020-09-24 – 2020-09-26 (×3): 25 mg via ORAL
  Filled 2020-09-24 (×3): qty 1

## 2020-09-24 MED ORDER — ASCORBIC ACID 500 MG PO TABS
500.0000 mg | ORAL_TABLET | Freq: Every day | ORAL | Status: DC
  Administered 2020-09-24 – 2020-09-27 (×4): 500 mg via ORAL
  Filled 2020-09-24 (×4): qty 1

## 2020-09-24 MED ORDER — SODIUM CHLORIDE 0.9% FLUSH: 10.0000 mL | INTRAVENOUS | Status: DC | PRN

## 2020-09-24 MED ORDER — UMECLIDINIUM BROMIDE 62.5 MCG/INH IN AEPB
1.0000 | INHALATION_SPRAY | Freq: Every day | RESPIRATORY_TRACT | Status: DC
  Administered 2020-09-25 – 2020-09-26 (×2): 1 via RESPIRATORY_TRACT

## 2020-09-24 MED ORDER — FOLIC ACID 1 MG PO TABS
1.0000 mg | ORAL_TABLET | Freq: Every day | ORAL | Status: DC
  Administered 2020-09-24 – 2020-09-27 (×3): 1 mg via ORAL
  Filled 2020-09-24 (×3): qty 1

## 2020-09-24 MED ORDER — IOHEXOL 9 MG/ML PO SOLN
500.0000 mL | ORAL | Status: AC
  Administered 2020-09-24: 500 mL via ORAL

## 2020-09-24 MED ORDER — ROSUVASTATIN CALCIUM 20 MG PO TABS
20.0000 mg | ORAL_TABLET | Freq: Every day | ORAL | Status: DC
  Administered 2020-09-24 – 2020-09-26 (×3): 20 mg via ORAL
  Filled 2020-09-24 (×3): qty 1

## 2020-09-24 MED ORDER — UMECLIDINIUM BROMIDE 62.5 MCG/INH IN AEPB
1.0000 | INHALATION_SPRAY | Freq: Every day | RESPIRATORY_TRACT | Status: DC
  Filled 2020-09-24: qty 7

## 2020-09-24 MED ORDER — PREDNISONE 5 MG PO TABS: 30.0000 mg | ORAL_TABLET | Freq: Every day | ORAL | Status: DC

## 2020-09-24 MED ORDER — POLYETHYLENE GLYCOL 3350 17 G PO PACK
17.0000 g | PACK | Freq: Every day | ORAL | Status: DC | PRN
  Administered 2020-09-25: 17 g via ORAL
  Filled 2020-09-24: qty 1

## 2020-09-24 MED ORDER — TRAMADOL HCL 50 MG PO TABS: 50.0000 mg | ORAL_TABLET | Freq: Four times a day (QID) | ORAL | Status: DC | PRN

## 2020-09-24 MED ORDER — ACETAMINOPHEN 325 MG PO TABS: 650.0000 mg | ORAL_TABLET | Freq: Four times a day (QID) | ORAL | Status: DC | PRN

## 2020-09-24 MED ORDER — SODIUM CHLORIDE 0.9% FLUSH
10.0000 mL | Freq: Two times a day (BID) | INTRAVENOUS | Status: DC
  Administered 2020-09-26: 10 mL

## 2020-09-24 MED ORDER — NICOTINE 14 MG/24HR TD PT24
14.0000 mg | MEDICATED_PATCH | Freq: Every day | TRANSDERMAL | Status: DC
  Administered 2020-09-24 – 2020-09-27 (×4): 14 mg via TRANSDERMAL
  Filled 2020-09-24 (×4): qty 1

## 2020-09-24 MED ORDER — CHLORHEXIDINE GLUCONATE CLOTH 2 % EX PADS
6.0000 | MEDICATED_PAD | Freq: Every day | CUTANEOUS | Status: DC
  Administered 2020-09-25 – 2020-09-27 (×2): 6 via TOPICAL

## 2020-09-24 MED ORDER — FLUOXETINE HCL 20 MG PO CAPS
20.0000 mg | ORAL_CAPSULE | Freq: Every day | ORAL | Status: DC
  Administered 2020-09-25 – 2020-09-27 (×3): 20 mg via ORAL
  Filled 2020-09-24 (×4): qty 1

## 2020-09-24 MED ORDER — SODIUM CHLORIDE 0.9 % IV SOLN: INTRAVENOUS | Status: AC

## 2020-09-24 MED ORDER — ONDANSETRON HCL 4 MG/2ML IJ SOLN: 4.0000 mg | Freq: Four times a day (QID) | INTRAMUSCULAR | Status: DC | PRN

## 2020-09-24 MED ORDER — VITAMIN B-12 1000 MCG PO TABS
1000.0000 ug | ORAL_TABLET | Freq: Every day | ORAL | Status: DC
  Administered 2020-09-24 – 2020-09-27 (×4): 1000 ug via ORAL
  Filled 2020-09-24 (×4): qty 1

## 2020-09-24 MED ORDER — AZATHIOPRINE 50 MG PO TABS: 100.0000 mg | ORAL_TABLET | Freq: Every day | ORAL | Status: DC

## 2020-09-24 MED ORDER — DOCUSATE SODIUM 100 MG PO CAPS
100.0000 mg | ORAL_CAPSULE | Freq: Two times a day (BID) | ORAL | Status: DC
  Administered 2020-09-24 – 2020-09-27 (×6): 100 mg via ORAL
  Filled 2020-09-24 (×6): qty 1

## 2020-09-24 NOTE — H&P (Signed)
History and Physical    [name]:[id number] DOB: 22-Jun-1940 DOA: 09/24/2020  PCP: Kirk Ruths, MD   Patient coming from:  Home  I have personally briefly reviewed patient's old medical records in [place]  Chief Complaint: Generalized weakness,  fatigue and decreased appetite.  HPI: [name] is a 80 y.o. female with medical history significant of TTP, multiple hospitalization for thrombocytopenia requiring plasmapheresis, recurrent relapses, chronic pain syndrome, COPD, gait instability, tobacco abuse was sent from oncologist office for acute blood loss anemia.  Patient reports having generalized weakness, fatigue and decreased appetite for 1 week after she was started on a new medication by her hematologist for TTP(Cablivi).  Patient reports her symptoms has been progressive and getting worse.  She denies any hematemesis, melena, blood in the stools. She reports mild abdominal pain associated with nausea, started yesterday.   She presented in the emergency department yesterday, she was hemodynamically stable,  her labs were normal except hemoglobin was 9.2. Patient was discharged from ED since she has appointment with hematologist in the morning today.  She was very tired and unable to walk in the mroning.  Her labs in oncologist office shows hemoglobin dropped from 9.2->8.7 and her stool for occult blood was positive.  She was sent in the hospital for direct admit and was recommended to have a GI evaluation.  ED Course: Patient is hemodynamically stable. Temp 98.8, HR 95, RR 20, BP 114/53, SPO2 94% Labs include: Sodium 140, potassium 3.9, chloride 106, bicarb 25, glucose 102, BUN 24, creatinine 0.73, calcium 8.3, anion gap 9, alkaline phosphatase 41, albumin 3.6, lipase 35, AST 25, ALT 37, total protein 5.9, total bilirubin 0.5, LDH 230, WBC 8.9, hemoglobin 8.9, hematocrit 28.7 MCV 109, platelet 251, reticulocyte count 241.5, chest x-ray no acute  abnormalities. Stool for occult blood positive CT abdomen and pelvis ordered and pending.   Review of Systems:  Review of Systems  Constitutional: Positive for malaise/fatigue.  HENT: Negative.   Eyes: Negative.   Respiratory: Negative.   Cardiovascular: Negative.   Gastrointestinal: Positive for abdominal pain, blood in stool, constipation and nausea.  Genitourinary: Negative.   Musculoskeletal: Positive for myalgias.  Skin: Negative.   Neurological: Positive for weakness.  Endo/Heme/Allergies: Negative.   Psychiatric/Behavioral: Negative.      Past Medical History:  Diagnosis Date  . Abnormal laboratory test 09/10/2013   Persistent elevation LDH  . Acute delirium   . Anemia   . Arthritis   . Bacteremia 03/2011  . Blood dyscrasia   . Blood transfusion   . Bronchitis, chronic obstructive w acute bronchitis ([place]) 08/12/2013  . Chronic back pain   . COPD (chronic obstructive pulmonary disease) ([place])   . Depression    "mild"  . 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 History of TTP (thrombotic thrombocytopenic purpura)   . Hypokalemia 08/08/2013   Steroid related  . Normal echocardiogram 03/15/11  . Oral thrush 08/08/2013  . Septicemia 03/2011  . Spinal stenosis, lumbar   . Tobacco abuse 05/10/2013    Past Surgical History:  Procedure Laterality Date  . CATARACT EXTRACTION W/ INTRAOCULAR LENS  IMPLANT, BILATERAL  ~ 2010  . CESAREAN SECTION  10/09/1976  . EYE SURGERY    . INSERTION OF DIALYSIS CATHETER Left 07/12/2013   Procedure: INSERTION OF DIALYSIS CATHETER   with Ultrasound;  Surgeon: Rosetta Posner, MD;  Location: [place];  Service: Vascular;  Laterality: Left;  .  IR PERC TUN PERIT CATH WO PORT S&I Dartha Lodge  07/16/2020  . IR US GUIDE VASC ACCESS LEFT  07/16/2020  . LUMBAR LAMINECTOMY/DECOMPRESSION MICRODISCECTOMY  03/16/2011   Procedure: LUMBAR LAMINECTOMY/DECOMPRESSION MICRODISCECTOMY;  Surgeon: Elaina Hoops;  Location: [facility] NEURO ORS;  Service:  Neurosurgery;  Laterality: N/A;  . TEE WITHOUT CARDIOVERSION  03/21/2011   Procedure: TRANSESOPHAGEAL ECHOCARDIOGRAM (TEE);  Surgeon: Laverda Page;  Location: [facility] ENDOSCOPY;  Service: Cardiovascular;  Laterality: N/A;  TEE for vegetations  . TONSILLECTOMY    . TUBAL LIGATION       reports that she has quit smoking. Her smoking use included e-cigarettes. She quit after 55.00 years of use. She has never used smokeless tobacco. She reports current alcohol use of about 7.0 standard drinks of alcohol per week. She reports that she does not use drugs.  Allergies  Allergen Reactions  . Adhesive [Tape] Other (See Comments)    Blisters, Band-Aids brand rips off the skin- paper tape only!!  . Cefuroxime Axetil Anxiety and Other (See Comments)    Made the patient jittery  . Other Hives and Other (See Comments)    Wool: Reaction is hives [employer] tape: Reaction is blistering   . Sulfa Antibiotics Hives  . Sulfa Drugs Cross Reactors Hives    No family history on file.  Family history reviewed and not pertinent .  Prior to Admission medications   Medication Sig Start Date End Date Taking? Authorizing Provider  ascorbic acid (VITAMIN C) 500 MG tablet Take 500 mg by mouth daily.    [provider]  azaTHIOprine (IMURAN) 50 MG tablet Take 2 tablets by mouth daily. 07/31/20   [provider]  caplacizumab (CABLIVI) 11 MG KIT injection Inject 11 mg into the skin daily. 08/31/20   [name], NP  cholecalciferol (VITAMIN D) 400 UNITS TABS Take 400 Units by mouth daily.    [provider]  cyanocobalamin 1000 MCG tablet Take 1,000 mcg by mouth daily.    [provider]  FLUoxetine (PROZAC) 20 MG capsule Take 20 mg by mouth daily.    Kirk Ruths, MD  folic acid (FOLVITE) 1 MG tablet Take 1 mg by mouth daily.    [provider]  gabapentin (NEURONTIN) 600 MG tablet Take 600 mg by mouth 3 (three) times daily.    [provider]  Multiple Vitamin (MULTIVITAMIN) tablet Take 1 tablet by mouth daily.     [provider]  Nicotine (NICODERM CQ TD) Place 1 patch onto the skin See admin instructions. Apply 1 patch onto the skin daily- remove as directed- ONLY WHEN HOSPITALIZED    [provider]  polyethylene glycol (MIRALAX / GLYCOLAX) packet Take 17 g by mouth daily. Patient taking differently: Take 17 g by mouth daily as needed for mild constipation (MIX AND DRINK). 04/08/17   Earleen Newport, MD  predniSONE (DELTASONE) 10 MG tablet Take 3 tablets (30 mg total) by mouth daily with breakfast. 09/07/20   [name], NP  QUEtiapine (SEROQUEL) 25 MG tablet Take 25 mg by mouth at bedtime.    [provider]  rosuvastatin (CRESTOR) 20 MG tablet Take 20 mg by mouth at bedtime. 05/22/20   [provider]  tiotropium (SPIRIVA) 18 MCG inhalation capsule Place 18 mcg into inhaler and inhale daily.    [provider]  traMADol (ULTRAM) 50 MG tablet Take 1 tablet (50 mg total) by mouth every 6 (six) hours as needed. Patient  taking differently: Take 50 mg by mouth 2 (two) times daily. 02/25/18   Versie Starks, PA-C    Physical Exam: Vitals:   09/24/20 1700 09/24/20 1701  BP: (!) 116/51 (!) 114/53  Pulse: 95   Resp: 20 20  Temp: 98.8 F (37.1 C) 98.8 F (37.1 C)  TempSrc: Oral Oral  SpO2: 94% 94%    Constitutional: NAD, calm, comfortable Vitals:   09/24/20 1700 09/24/20 1701  BP: (!) 116/51 (!) 114/53  Pulse: 95   Resp: 20 20  Temp: 98.8 F (37.1 C) 98.8 F (37.1 C)  TempSrc: Oral Oral  SpO2: 94% 94%   Eyes: PERRL, lids and conjunctivae normal ENMT: Mucous membranes are moist. Posterior pharynx clear of any exudate or lesions.Normal dentition.  Neck: normal, supple, no masses, no thyromegaly, Central line noted. Respiratory: clear to auscultation bilaterally, no wheezing, no crackles. Normal respiratory effort. No accessory muscle use.   Cardiovascular: Regular rate and rhythm, no murmurs / rubs / gallops. No extremity edema. 2+ pedal pulses. No carotid bruits.  Abdomen:  Mild tenderness+, no masses palpated. No hepatosplenomegaly. Bowel sounds positive. No signs of bleeding noted. Musculoskeletal: no clubbing / cyanosis. No joint deformity upper and lower extremities. Good ROM, no contractures. Normal muscle tone.  Skin: no rashes, lesions, ulcers. No induration Neurologic: CN 2-12 grossly intact. Sensation intact, DTR normal. Strength 5/5 in all 4.  Psychiatric: Normal judgment and insight. Alert and oriented x 3. Normal mood.   Labs on Admission: I have personally reviewed following labs and imaging studies  CBC: Recent Labs  Lab 09/21/20 1331 09/23/20 0956 09/24/20 0947  WBC 8.6 8.0 8.1  NEUTROABS 5.0 4.8 5.1  HGB 10.2* 9.2* 8.9*  HCT 31.8* 29.7* 28.7*  MCV 99.7 105.3* 102.9*  PLT 249 233 858   Basic Metabolic Panel: Recent Labs  Lab 09/21/20 1331 09/23/20 0956 09/24/20 0947  NA 140 139 140  K 4.2 3.9 3.9  CL 105 109 106  CO2 '28 26 25  ' GLUCOSE 97 104* 102*  BUN 30* 25* 24*  CREATININE 0.93 0.84 0.73  CALCIUM 8.9 8.7* 8.3*   GFR: Estimated Creatinine Clearance: 56.7 mL/min (by C-G formula based on SCr of 0.73 mg/dL). Liver Function Tests: Recent Labs  Lab 09/21/20 1331 09/23/20 0956 09/24/20 0947  AST '22 30 25  ' ALT 28 38 37  ALKPHOS 37* 37* 41  BILITOT 0.4 0.6 0.5  PROT 6.1* 5.6* 5.9*  ALBUMIN 3.5 3.1* 3.6   Recent Labs  Lab 09/23/20 0956  LIPASE 35   No results for input(s): AMMONIA in the last 168 hours. Coagulation Profile: No results for input(s): INR, PROTIME in the last 168 hours. Cardiac Enzymes: No results for input(s): CKTOTAL, CKMB, CKMBINDEX, TROPONINI in the last 168 hours. BNP (last 3 results) No results for input(s): PROBNP in the last 8760 hours. HbA1C: No results for input(s): HGBA1C in the last 72 hours. CBG: No results for input(s): GLUCAP in the last 168  hours. Lipid Profile: No results for input(s): CHOL, HDL, LDLCALC, TRIG, CHOLHDL, LDLDIRECT in the last 72 hours. Thyroid Function Tests: No results for input(s): TSH, T4TOTAL, FREET4, T3FREE, THYROIDAB in the last 72 hours. Anemia Panel: Recent Labs    09/23/20 1313 09/24/20 0947  RETICCTPCT 8.3* 8.7*   Urine analysis:    Component Value Date/Time   COLORURINE YELLOW 07/19/2020 0542   APPEARANCEUR CLEAR 07/19/2020 0542   LABSPEC 1.015 07/19/2020 0542   PHURINE 6.0 07/19/2020 0542   GLUCOSEU NEGATIVE 07/19/2020 0542  HGBUR NEGATIVE 07/19/2020 0542   BILIRUBINUR NEGATIVE 07/19/2020 0542   KETONESUR NEGATIVE 07/19/2020 0542   PROTEINUR NEGATIVE 07/19/2020 0542   UROBILINOGEN 1.0 08/19/2013 1225   NITRITE NEGATIVE 07/19/2020 0542   LEUKOCYTESUR NEGATIVE 07/19/2020 0542    Radiological Exams on Admission: DG Chest Port 1 View  Result Date: 09/23/2020 CLINICAL DATA:  Weakness EXAM: PORTABLE CHEST 1 VIEW COMPARISON:  Prior chest x-ray 08/20/2013 FINDINGS: Left IJ approach hemodialysis catheter. The catheter tip projects over the mid SVC. Stable cardiac and mediastinal contours. No pleural effusion or pulmonary edema. No evidence of airspace opacity or pneumothorax. No acute osseous abnormality. IMPRESSION: No active disease. Left IJ hemodialysis catheter.  Catheter tip overlies the mid SVC. Electronically Signed   By: Jacqulynn Cadet M.D.   On: 09/23/2020 11:11    EKG: Independently reviewed.  Sinus rhythm, multiple PVCs.  Assessment/Plan Principal Problem:   Blood loss anemia Active Problems:   History of TTP (thrombotic thrombocytopenic purpura)   COPD (chronic obstructive pulmonary disease) (HCC)   Gait instability   Tobacco abuse   Chronic pain disorder    Acute blood loss anemia: Patient presented with generalized weakness, fatigue, nausea. Patient was recently started on new medication Cablivi by hematologist for TTP Her Hb before medication on 5/22 >11.5,  dropped to  >8.9 today. Patient denies any hematemesis, melena or blood in the stools. He reports worsening weakness, unable to walk. She is found to have stool positive for occult blood in oncologist office. Hematologist reports most common side effects for Cabllivi is spontaneous bleeding. Patient reports mild abdominal pain, but denies any vomiting. Will obtain CT abdomen and pelvis without contrast to rule out bleeding. Monitor H&H every 6 hours. Transfuse if hemoglobin drops below 8. GI consulted, Dr. Michail Sermon with [facility] physician notified.  TTP: Patient reports recurrent hospitalizations and relapses for TTP. Patient does have a port for plasmapheresis. She was recently started on new medication causing generalized weakness. Will hold prednisone, Cablivi and imuran. Hematology consulted Dr. Benay Spice will see the patient tomorrow.  Chronic pain syndrome: Continue home medications Continue tramadol and  gabapentin.  Tobacco abuse: Continue nicotine patch.   COPD: Continue home inhalers.  Not in exacerbation.  Depression: Continue fluoxetine, Seroquel.  Generalized weakness/fatigue: Could be secondary to anemia. PT and OT evaluation.    DVT prophylaxis: SCDS Code Status: Full code. Family Communication: Husband at bedside.  I spoke with daughter on the phone Disposition Plan:   Status is: Inpatient  Remains inpatient appropriate because:Inpatient level of care appropriate due to severity of illness   Dispo: The patient is from: Home              Anticipated d/c is to: Home              Patient currently is not medically stable to d/c.   Difficult to place patient No   Consults called: GI , Hematology Admission status: Inpatient   Shawna Clamp MD [facility]   If 7PM-7AM, please contact night-coverage [email or web address]   09/24/2020, 5:24 PM

## 2020-09-24 NOTE — Progress Notes (Signed)
Patient will be admitted. Patient is hungry and wanting to eat. [name] NP recommended that the patient stay on clear liquids until seen by hospitalist. Pt's daughter is going to bring patient a salad to eat due to patient being hungry.

## 2020-09-24 NOTE — Progress Notes (Addendum)
[place] OFFICE PROGRESS NOTE   Diagnosis: TTP  INTERVAL HISTORY:   [name] returns prior to scheduled follow-up for evaluation of fatigue/weakness.  She was seen in the emergency department yesterday.  Hemoglobin returned at 9.2.  Platelet count in normal range.  Reticulocyte count elevated, LDH mildly elevated and bilirubin normal.  She is not aware of bleeding.  Specifically no black stools.  Some nausea this morning.  No change in baseline shortness of breath.  She has had no further falls.  No fever.  She discontinued Cablivi Sunday.  Objective:  Vital signs in last 24 hours:  Blood pressure 117/90, pulse (!) 109, temperature 97.7 F (36.5 C), temperature source Oral, resp. rate 18, height 5\' 4"  (1.626 m), SpO2 96 %.   Resp: Coarse inspiratory/expiratory wheezes.  No respiratory distress. Cardio: Regular rate and rhythm. GI: Abdomen soft and nontender.  Stool is dark brown, Hemoccult test diffusely positive for blood. Vascular: No leg edema.  Catheter left neck without erythema.  Lab Results:  Lab Results  Component Value Date   WBC 8.1 09/24/2020   HGB 8.9 (L) 09/24/2020   HCT 28.7 (L) 09/24/2020   MCV 102.9 (H) 09/24/2020   PLT 251 09/24/2020   NEUTROABS 5.1 09/24/2020  Peripheral blood smear: #1 White blood cells-rare myelocyte, morphology otherwise unremarkable; #2 platelets-normal in number, few giant platelets, no clumps; #3 red blood cells-increased polychromasia, rare nucleated red blood cell, rare ovalocytes, teardrops and helmet cells, no schistocytes  Imaging:  DG Chest Port 1 View  Result Date: 09/23/2020 CLINICAL DATA:  Weakness EXAM: PORTABLE CHEST 1 VIEW COMPARISON:  Prior chest x-ray 08/20/2013 FINDINGS: Left IJ approach hemodialysis catheter. The catheter tip projects over the mid SVC. Stable cardiac and mediastinal contours. No pleural effusion or pulmonary edema. No evidence of airspace opacity or pneumothorax. No acute osseous  abnormality. IMPRESSION: No active disease. Left IJ hemodialysis catheter.  Catheter tip overlies the mid SVC. Electronically Signed   By: Jacqulynn Cadet M.D.   On: 09/23/2020 11:11    Medications: I have reviewed the patient's current medications.  Assessment/Plan: 1. TTP diagnosed in 2005, treated with plasma exchange, steroids, and rituximab consolidation ? 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 Relapse of TTP March 2015-plasma exchange, steroids, rituximab, and maintenance azathioprine ? Admission with severe thrombocytopenia and elevated LDH 07/16/2020, ADAMTS13 activity less than 2%,consistent with relapse of TTP -Steroids/FFP given 07/16/2020 -Daily plasma exchange beginning 07/17/2020, 7 days, then qod starting 3/24, discharge 07/21/2020 on prednisone 60 mg daily -Prednisone taper to 40 mg daily 07/29/2020 -rituximab 07/30/2020, 08/06/2020, 08/13/2020, 08/20/2020 -plasmapheresis on 07/31/2020, 08/03/2020, 08/05/2020, and 08/07/2020 -prednisone increased to 60 mg daily 08/17/2020 -Plasmapheresis 08/24/2020,08/25/2020, 08/26/2020, 08/27/2020, 08/28/2020,08/29/2020, 08/31/2020, 09/02/2020, 09/04/2020 -caplacizumabinitiated 08/24/2020 -Decrease prednisone to 40 mg daily 08/28/2020       -ADAMTS13 37% on 09/04/2020       -Prednisone decreased to 30 mg daily 09/07/2020       -Continue caplacizumab       -prednisone decreased to 20 mg daily beginning 09/18/2020 2. History of epidural abscess requiring laminectomy 2012 3. 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 COPD 6. Anemia, progressive- stool positive for blood   Disposition: [name] has a long history of TTP, multiply relapsed, started on [facility] 08/24/2020.  She presents today with continued weakness/fatigue.  She has progressive anemia, normal platelet count, normal bilirubin, mildly elevated  LDH, increased reticulocyte count.  Peripheral blood smear is not suggestive of TTP as etiology of anemia.  Stool Hemoccult diffusely positive for blood.  Last dose of Cablivi 09/20/2020.  Arrangements are being made for admission to [facility] for gastrointestinal bleeding.  Dr. Benay Spice has spoken with Dr. Marylyn Ishihara.  Patient seen with Dr. Benay Spice.   [name] ANP/GNP-BC   09/24/2020  11:22 AM This was a shared visit with [name].  [name] was interviewed and examined.  She has developed progressive anemia over the past 2 weeks.  The platelet count remains in the normal range.  Markers of hemolysis remain improved.  She is mounting a reticulocytosis.  I reviewed the peripheral blood smear today.  I do not see schistocytes.  I suspect the anemia secondary to GI bleeding while on caplacizumab.  She last took caplacizumab on 09/20/2020.  She is also at risk for bleeding due to recent steroid therapy.  I recommend hospital admission for observation, transfusion support, and GI consultation.  I was present for greater than 50% of today's visit.  I performed medical decision making.  Julieanne Manson, MD

## 2020-09-25 ENCOUNTER — Encounter (HOSPITAL_COMMUNITY): Payer: Self-pay | Admitting: Family Medicine

## 2020-09-25 ENCOUNTER — Other Ambulatory Visit: Payer: Self-pay

## 2020-09-25 ENCOUNTER — Encounter: Payer: Self-pay | Admitting: Oncology

## 2020-09-25 DIAGNOSIS — I700 Atherosclerosis of aorta: Secondary | ICD-10-CM

## 2020-09-25 DIAGNOSIS — N949 Unspecified condition associated with female genital organs and menstrual cycle: Secondary | ICD-10-CM

## 2020-09-25 DIAGNOSIS — M3119 Other thrombotic microangiopathy: Secondary | ICD-10-CM | POA: Diagnosis not present

## 2020-09-25 DIAGNOSIS — D62 Acute posthemorrhagic anemia: Principal | ICD-10-CM

## 2020-09-25 HISTORY — DX: Unspecified condition associated with female genital organs and menstrual cycle: N94.9

## 2020-09-25 LAB — PREPARE RBC (CROSSMATCH)

## 2020-09-25 LAB — CBC
HCT: 24.4 % — ABNORMAL LOW (ref 36.0–46.0)
Hemoglobin: 7.6 g/dL — ABNORMAL LOW (ref 12.0–15.0)
MCH: 32.3 pg (ref 26.0–34.0)
MCHC: 31.1 g/dL (ref 30.0–36.0)
MCV: 103.8 fL — ABNORMAL HIGH (ref 80.0–100.0)
Platelets: 204 10*3/uL (ref 150–400)
RBC: 2.35 MIL/uL — ABNORMAL LOW (ref 3.87–5.11)
RDW: 16.3 % — ABNORMAL HIGH (ref 11.5–15.5)
WBC: 7.1 10*3/uL (ref 4.0–10.5)
nRBC: 0.4 % — ABNORMAL HIGH (ref 0.0–0.2)

## 2020-09-25 LAB — HEMOGLOBIN AND HEMATOCRIT, BLOOD
HCT: 22.9 % — ABNORMAL LOW (ref 36.0–46.0)
HCT: 28.7 % — ABNORMAL LOW (ref 36.0–46.0)
Hemoglobin: 7 g/dL — ABNORMAL LOW (ref 12.0–15.0)
Hemoglobin: 8.9 g/dL — ABNORMAL LOW (ref 12.0–15.0)

## 2020-09-25 LAB — ADAMTS13 ANTIBODY: ADAMTS13 Antibody: 2 Units/mL (ref <12)

## 2020-09-25 LAB — COMPREHENSIVE METABOLIC PANEL
ALT: 40 U/L (ref 0–44)
AST: 30 U/L (ref 15–41)
Albumin: 3 g/dL — ABNORMAL LOW (ref 3.5–5.0)
Alkaline Phosphatase: 35 U/L — ABNORMAL LOW (ref 38–126)
Anion gap: 6 (ref 5–15)
BUN: 23 mg/dL (ref 8–23)
CO2: 24 mmol/L (ref 22–32)
Calcium: 8.7 mg/dL — ABNORMAL LOW (ref 8.9–10.3)
Chloride: 111 mmol/L (ref 98–111)
Creatinine, Ser: 0.73 mg/dL (ref 0.44–1.00)
GFR, Estimated: >60 mL/min (ref >60)
Glucose, Bld: 98 mg/dL (ref 70–99)
Potassium: 3.9 mmol/L (ref 3.5–5.1)
Sodium: 141 mmol/L (ref 135–145)
Total Bilirubin: 0.4 mg/dL (ref 0.3–1.2)
Total Protein: 5.4 g/dL — ABNORMAL LOW (ref 6.5–8.1)

## 2020-09-25 LAB — ADAMTS13 ACTIVITY: Adamts 13 Activity: 16.4 % (ref >66.8)

## 2020-09-25 MED ORDER — SODIUM CHLORIDE 0.9% IV SOLUTION: Freq: Once | INTRAVENOUS | Status: AC

## 2020-09-25 MED ORDER — ALTEPLASE 2 MG IJ SOLR
2.0000 mg | Freq: Once | INTRAMUSCULAR | Status: AC
  Administered 2020-09-25: 2 mg
  Filled 2020-09-25: qty 2

## 2020-09-25 MED ORDER — PANTOPRAZOLE SODIUM 40 MG IV SOLR
40.0000 mg | Freq: Two times a day (BID) | INTRAVENOUS | Status: DC
  Administered 2020-09-25: 40 mg via INTRAVENOUS
  Filled 2020-09-25: qty 40

## 2020-09-25 MED ORDER — PANTOPRAZOLE SODIUM 40 MG PO TBEC
40.0000 mg | DELAYED_RELEASE_TABLET | Freq: Two times a day (BID) | ORAL | Status: DC
  Administered 2020-09-26 (×2): 40 mg via ORAL
  Filled 2020-09-25 (×2): qty 1

## 2020-09-25 NOTE — Telephone Encounter (Signed)
Notified that patient will not be continuing the [facility].

## 2020-09-25 NOTE — Consult Note (Signed)
Referring Provider: [facility] Primary Care Physician:  Kirk Ruths, MD Primary Gastroenterologist:  Althia Forts  Reason for Consultation:  Anemia, heme-positive stool  HPI: [name] is a 80 y.o. female with past medical history of TTP, chronic pain, and COPD presenting for consultation of anemia and heme-positive stool.  Patient presented to the ED on 5/25 after progressive weakness and lethargy.  On arrival, hemoglobin was 9.2, which has steadily decreased to 7.6.  She denies any overt melena or hematochezia, though was heme positive.  She further denies nausea, vomiting, abdominal pain (with the exception of injection site tenderness).  Has had a poor appetite over the last several days but felt hungry and had a breakfast of eggs, oatmeal, and peaches today. Denies dysphagia or GERD.  She is followed by hematology for TTP.  She has started on Cablivi, which can cause spontaneous bleeding, particularly within first 7 days after administration.   She has also been on prednisone.  Denies NSAID, ASA, or blood thinner use.  Denies family history of gastrointestinal disorders or malignancy.  Per chart review, last colonoscopy 2010, which was reportedly normal, though results not available via chart review. No prior EGD.  Past Medical History:  Diagnosis Date  . Abnormal laboratory test 09/10/2013   Persistent elevation LDH  . Acute delirium   . Anemia   . Arthritis   . Bacteremia 03/2011  . Blood dyscrasia   . Blood transfusion   . Bronchitis, chronic obstructive w acute bronchitis ([place]) 08/12/2013  . Chronic back pain   . COPD (chronic obstructive pulmonary disease) ([place])   . Depression    "mild"  . 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 History of TTP (thrombotic thrombocytopenic purpura)   . Hypokalemia 08/08/2013   Steroid related  . Normal echocardiogram 03/15/11  . Oral thrush 08/08/2013  . Septicemia 03/2011  . Spinal stenosis, lumbar   .  Tobacco abuse 05/10/2013    Past Surgical History:  Procedure Laterality Date  . CATARACT EXTRACTION W/ INTRAOCULAR LENS  IMPLANT, BILATERAL  ~ 2010  . CESAREAN SECTION  10/09/1976  . EYE SURGERY    . INSERTION OF DIALYSIS CATHETER Left 07/12/2013   Procedure: INSERTION OF DIALYSIS CATHETER   with Ultrasound;  Surgeon: Rosetta Posner, MD;  Location: [facility] OR;  Service: Vascular;  Laterality: Left;  . IR PERC TUN PERIT CATH WO PORT S&I Dartha Lodge  07/16/2020  . IR US GUIDE VASC ACCESS LEFT  07/16/2020  . LUMBAR LAMINECTOMY/DECOMPRESSION MICRODISCECTOMY  03/16/2011   Procedure: LUMBAR LAMINECTOMY/DECOMPRESSION MICRODISCECTOMY;  Surgeon: Elaina Hoops;  Location: [place] NEURO ORS;  Service: Neurosurgery;  Laterality: N/A;  . TEE WITHOUT CARDIOVERSION  03/21/2011   Procedure: TRANSESOPHAGEAL ECHOCARDIOGRAM (TEE);  Surgeon: Laverda Page;  Location: [facility] ENDOSCOPY;  Service: Cardiovascular;  Laterality: N/A;  TEE for vegetations  . TONSILLECTOMY    . TUBAL LIGATION      Prior to Admission medications   Medication Sig Start Date End Date Taking? Authorizing Provider  ascorbic acid (VITAMIN C) 500 MG tablet Take 500 mg by mouth daily.   Yes [provider]  azaTHIOprine (IMURAN) 50 MG tablet Take 100 tablets by mouth daily. 07/31/20  Yes [provider]  cholecalciferol (VITAMIN D) 400 UNITS TABS Take 400 Units by mouth daily.   Yes [provider]  cyanocobalamin 1000 MCG tablet Take 1,000 mcg by mouth daily.   Yes [provider]  FLUoxetine (PROZAC) 20 MG capsule  Take 20 mg by mouth daily.   Yes Kirk Ruths, MD  folic acid (FOLVITE) 1 MG tablet Take 1 mg by mouth daily.   Yes [provider]  gabapentin (NEURONTIN) 600 MG tablet Take 600 mg by mouth 3 (three) times daily.   Yes [provider]  Multiple Vitamin (MULTIVITAMIN) tablet Take 1 tablet by mouth daily.    Yes [provider]  Nicotine (NICODERM CQ TD) Place 1 patch onto the skin  See admin instructions. Apply 1 patch onto the skin daily- remove as directed- ONLY WHEN HOSPITALIZED   Yes [provider]  polyethylene glycol (MIRALAX / GLYCOLAX) packet Take 17 g by mouth daily. Patient taking differently: Take 17 g by mouth daily as needed for mild constipation (MIX AND DRINK). 04/08/17  Yes Earleen Newport, MD  predniSONE (DELTASONE) 10 MG tablet Take 3 tablets (30 mg total) by mouth daily with breakfast. 09/07/20  Yes [name], NP  QUEtiapine (SEROQUEL) 25 MG tablet Take 25 mg by mouth at bedtime.   Yes [provider]  rosuvastatin (CRESTOR) 20 MG tablet Take 20 mg by mouth at bedtime. 05/22/20  Yes [provider]  tiotropium (SPIRIVA) 18 MCG inhalation capsule Place 18 mcg into inhaler and inhale daily.   Yes [provider]  traMADol (ULTRAM) 50 MG tablet Take 1 tablet (50 mg total) by mouth every 6 (six) hours as needed. Patient taking differently: Take 50 mg by mouth 2 (two) times daily. 02/25/18  Yes Fisher, Linden Dolin, PA-C  caplacizumab (CABLIVI) 11 MG KIT injection Inject 11 mg into the skin daily. 08/31/20   [name], NP    Scheduled Meds: . sodium chloride   Intravenous Once  . ascorbic acid  500 mg Oral Daily  . Chlorhexidine Gluconate Cloth  6 each Topical Daily  . docusate sodium  100 mg Oral BID  . FLUoxetine  20 mg Oral Daily  . folic acid  1 mg Oral Daily  . gabapentin  600 mg Oral TID  . nicotine  14 mg Transdermal Daily  . QUEtiapine  25 mg Oral QHS  . rosuvastatin  20 mg Oral QHS  . sodium chloride flush  10-40 mL Intracatheter Q12H  . umeclidinium bromide  1 puff Inhalation Daily  . cyanocobalamin  1,000 mcg Oral Daily   Continuous Infusions: PRN Meds:.acetaminophen **OR** acetaminophen, ondansetron **OR** ondansetron (ZOFRAN) IV, polyethylene glycol, sodium chloride flush, traMADol  Allergies as of 09/24/2020 - Review Complete 09/24/2020  Allergen Reaction Noted  . Adhesive [tape] Other (See  Comments) 05/05/2011  . Cefuroxime axetil Anxiety and Other (See Comments) 11/05/2012  . Other Hives and Other (See Comments) 03/16/2011  . Sulfa antibiotics Hives 03/16/2011  . Sulfa drugs cross reactors Hives 03/16/2011    History reviewed. No pertinent family history.  Social History   Socioeconomic History  . Marital status: Married    Spouse name: Not on file  . Number of children: Not on file  . Years of education: Not on file  . Highest education level: Not on file  Occupational History  . Not on file  Tobacco Use  . Smoking status: Former Smoker    Years: 55.00    Types: E-cigarettes  . Smokeless tobacco: Never Used  . Tobacco comment: Vapors  Substance and Sexual Activity  . Alcohol use: Yes    Alcohol/week: 7.0 standard drinks    Types: 7 Cans of beer per week    Comment: Beer a night.  Marland Kitchen  Drug use: No  . Sexual activity: Not on file  Other Topics Concern  . Not on file  Social History Narrative  . Not on file   Social Determinants of Health   Financial Resource Strain: Not on file  Food Insecurity: Not on file  Transportation Needs: Not on file  Physical Activity: Not on file  Stress: Not on file  Social Connections: Not on file  Intimate Partner Violence: Not on file    Review of Systems: Review of Systems  Constitutional: Positive for malaise/fatigue. Negative for chills and fever.  HENT: Negative for sinus pain and sore throat.   Eyes: Negative for pain and redness.  Respiratory: Negative for cough and shortness of breath.   Cardiovascular: Negative for chest pain and palpitations.  Gastrointestinal: Negative for abdominal pain, blood in stool, constipation, diarrhea, heartburn, melena, nausea and vomiting.  Genitourinary: Negative for flank pain and hematuria.  Musculoskeletal: Positive for falls. Negative for back pain.  Skin: Negative for itching and rash.  Neurological: Positive for weakness. Negative for loss of consciousness.   Endo/Heme/Allergies: Negative for polydipsia. Does not bruise/bleed easily.  Psychiatric/Behavioral: Negative for substance abuse. The patient is not nervous/anxious.      Physical Exam: Vital signs: Vitals:   09/25/20 0440 09/25/20 0749  BP: (!) 122/47   Pulse: 94   Resp: 17   Temp: 98.5 F (36.9 C)   SpO2: 97% 98%   Last BM Date: 09/18/20 Physical Exam Vitals reviewed.  Constitutional:      General: She is sleeping. She is not in acute distress. HENT:     Head: Normocephalic and atraumatic.     Nose: Nose normal. No congestion.     Mouth/Throat:     Mouth: Mucous membranes are moist.     Pharynx: Oropharynx is clear.  Eyes:     General: No scleral icterus.    Extraocular Movements: Extraocular movements intact.  Cardiovascular:     Rate and Rhythm: Normal rate and regular rhythm.  Pulmonary:     Effort: Pulmonary effort is normal. No respiratory distress.     Breath sounds: Wheezing (mild expiratory) present.  Abdominal:     General: Bowel sounds are normal. There is no distension.     Palpations: Abdomen is soft. There is no mass.     Tenderness: There is no abdominal tenderness. There is no guarding or rebound.     Hernia: No hernia is present.  Musculoskeletal:        General: No swelling.     Cervical back: Normal range of motion and neck supple.  Skin:    General: Skin is warm and dry.  Neurological:     General: No focal deficit present.     Mental Status: She is oriented to person, place, and time. She is lethargic.  Psychiatric:        Mood and Affect: Mood normal.        Behavior: Behavior normal. Behavior is cooperative.      GI:  Lab Results: Recent Labs    09/23/20 0956 09/24/20 0947 09/24/20 1844 09/25/20 0451  WBC 8.0 8.1  --  7.1  HGB 9.2* 8.9* 7.8* 7.6*  HCT 29.7* 28.7* 24.6* 24.4*  PLT 233 251  --  204   BMET Recent Labs    09/23/20 0956 09/24/20 0947 09/25/20 0451  NA 139 140 141  K 3.9 3.9 3.9  CL 109 106 111  CO2 '26  25 24  ' GLUCOSE 104* 102* 98  BUN 25* 24* 23  CREATININE 0.84 0.73 0.73  CALCIUM 8.7* 8.3* 8.7*   LFT Recent Labs    09/25/20 0451  PROT 5.4*  ALBUMIN 3.0*  AST 30  ALT 40  ALKPHOS 35*  BILITOT 0.4   PT/INR No results for input(s): LABPROT, INR in the last 72 hours.   Studies/Results: CT ABDOMEN PELVIS WO CONTRAST  Result Date: 09/24/2020 CLINICAL DATA:  Acute abdominal pain. EXAM: CT ABDOMEN AND PELVIS WITHOUT CONTRAST TECHNIQUE: Multidetector CT imaging of the abdomen and pelvis was performed following the standard protocol without IV contrast. COMPARISON:  Abdominopelvic CT 04/08/2017 FINDINGS: Lower chest: No basilar airspace disease or pleural effusion. Normal heart size. Trace pericardial effusion. Hepatobiliary: No focal liver abnormality on unenhanced exam. Punctate calcification within the gallbladder likely gallstone. No pericholecystic inflammation. No biliary dilatation. Pancreas: No ductal dilatation or inflammation. Spleen: Normal in size without focal abnormality. Adrenals/Urinary Tract: Normal adrenal glands. No hydronephrosis. 3 mm nonobstructing stone in the lower right kidney. There are small renal cysts. Mild left renal parenchymal thinning. Partially distended urinary bladder. No bladder wall thickening. Stomach/Bowel: Unremarkable stomach. Tiny intramural lipoma within the fourth portion of the duodenum is unchanged from prior, nonobstructing and of no clinical significance. No small bowel obstruction or inflammation. Administered enteric contrast reaches the colon. Normal air-filled appendix. Moderate colonic stool burden. No colonic wall thickening or inflammation. Vascular/Lymphatic: Advanced aortic atherosclerosis. Aortic tortuosity. No aneurysm. There is no bulky abdominopelvic adenopathy. Reproductive: The previous 6.5 cm left adnexal cyst has diminished in size, currently measuring 4.8 x 4.7 cm, however has increased in density and no longer measures simple fluid.  Probable punctate uterine fibroid. Right ovary is quiescent. Other: Small fat containing umbilical hernia. No ascites or free fluid. Musculoskeletal: Diffuse degenerative change throughout the spine. Ill T12 inferior endplate compression fracture, chronic and unchanged from previous. Levo scoliotic curvature of the lumbar spine. Hemi transitional lumbosacral anatomy. No acute osseous abnormalities are seen. IMPRESSION: 1. No acute abnormality in the abdomen/pelvis. 2. Decreased size of previous 6.5 cm left adnexal cyst, currently measuring 4.8 x 4.7 cm, however has increased in density and no longer measures simple fluid. This may represent interval hemorrhage within cyst. Because this lesion is not adequately characterized, prompt [place] is recommended for further evaluation. Note: This recommendation does not apply to premenarchal patients and to those with increased risk (genetic, family history, elevated tumor markers or other high-risk factors) of ovarian cancer. Reference: JACR 2020 Feb; 17(2):248-254 3. Nonobstructing right renal stone. 4. Cholelithiasis without gallbladder inflammation. Aortic Atherosclerosis (ICD10-I70.0). Electronically Signed   By: Keith Rake M.D.   On: 09/24/2020 22:55   X-ray chest PA and lateral  Result Date: 09/24/2020 CLINICAL DATA:  Pneumonia. EXAM: CHEST - 2 VIEW COMPARISON:  Radiograph yesterday. FINDINGS: Left-sided dialysis catheter remains in place. Mild bronchial thickening without focal airspace disease. Normal heart size and mediastinal contours. No pleural fluid or pneumothorax. No acute osseous abnormalities are seen. IMPRESSION: Mild bronchial thickening without focal airspace disease. Electronically Signed   By: Keith Rake M.D.   On: 09/24/2020 22:56   DG Chest Port 1 View  Result Date: 09/23/2020 CLINICAL DATA:  Weakness EXAM: PORTABLE CHEST 1 VIEW COMPARISON:  Prior chest x-ray 08/20/2013 FINDINGS: Left IJ approach hemodialysis catheter. The catheter  tip projects over the mid SVC. Stable cardiac and mediastinal contours. No pleural effusion or pulmonary edema. No evidence of airspace opacity or pneumothorax. No acute osseous abnormality. IMPRESSION: No active disease. Left IJ hemodialysis catheter.  Catheter  tip overlies the mid SVC. Electronically Signed   By: Jacqulynn Cadet M.D.   On: 09/23/2020 11:11    Impression: Anemia, heme-positive stool -Hgb 7.6, decreased from baseline 12.1 -Recently had elevated BUN of 30-31, though has now normalized to 23  TTP, followed by hematology: has started on Cablivi, which can cause spontaneous bleeding, particularly within first 7 days after administration.  Also on prednisone  Plan: Discussed options with patient and patient's family at bedside.  Discussed proceeding with EGD to rule out upper GI source of bleeding.  Patient's daughter and patient's state they prefer to hold off on any procedures at this time and prefer conservative management.  Discussed that if progressive drop in hemoglobin or overt bleeding, would recommend EGD.  At that time, patient and patient's daughter state they would be willing to rediscuss.  Start Protonix 40 mg IV BID.  Continue to monitor H&H with transfusion as needed to maintain Hgb >7.  [facility] will follow.   LOS: 1 day   Salley Slaughter  PA-C 09/25/2020, 9:11 AM  Contact #  [phone]

## 2020-09-25 NOTE — Progress Notes (Addendum)
HEMATOLOGY-ONCOLOGY PROGRESS NOTE  SUBJECTIVE: [name] was admitted secondary to symptomatic anemia and heme positive stools.  Feels better this morning.  No bleeding reported.  PHYSICAL EXAMINATION:  Vitals:   09/25/20 0440 09/25/20 0749  BP: (!) 122/47   Pulse: 94   Resp: 17   Temp: 98.5 F (36.9 C)   SpO2: 97% 98%   There were no vitals filed for this visit.  Intake/Output from previous day: 05/26 0701 - 05/27 0700 In: 0  Out: 500 [Urine:500]  GENERAL:alert, no distress and comfortable LUNGS: Coarse wheezing HEART: regular rate & rhythm and no murmurs ABDOMEN:abdomen soft, non-tender and normal bowel sounds NEURO: alert & oriented x 3 with fluent speech  Catheter left neck without erythema  LABORATORY DATA:  I have reviewed the data as listed CMP Latest Ref Rng & Units 09/25/2020 09/24/2020 09/23/2020  Glucose 70 - 99 mg/dL 98 102(H) 104(H)  BUN 8 - 23 mg/dL 23 24(H) 25(H)  Creatinine 0.44 - 1.00 mg/dL 0.73 0.73 0.84  Sodium 135 - 145 mmol/L 141 140 139  Potassium 3.5 - 5.1 mmol/L 3.9 3.9 3.9  Chloride 98 - 111 mmol/L 111 106 109  CO2 22 - 32 mmol/L 24 25 26   Calcium 8.9 - 10.3 mg/dL 8.7(L) 8.3(L) 8.7(L)  Total Protein 6.5 - 8.1 g/dL 5.4(L) 5.9(L) 5.6(L)  Total Bilirubin 0.3 - 1.2 mg/dL 0.4 0.5 0.6  Alkaline Phos 38 - 126 U/L 35(L) 41 37(L)  AST 15 - 41 U/L 30 25 30   ALT 0 - 44 U/L 40 37 38    Lab Results  Component Value Date   WBC 7.1 09/25/2020   HGB 7.6 (L) 09/25/2020   HCT 24.4 (L) 09/25/2020   MCV 103.8 (H) 09/25/2020   PLT 204 09/25/2020   NEUTROABS 5.1 09/24/2020    CT ABDOMEN PELVIS WO CONTRAST  Result Date: 09/24/2020 CLINICAL DATA:  Acute abdominal pain. EXAM: CT ABDOMEN AND PELVIS WITHOUT CONTRAST TECHNIQUE: Multidetector CT imaging of the abdomen and pelvis was performed following the standard protocol without IV contrast. COMPARISON:  Abdominopelvic CT 04/08/2017 FINDINGS: Lower chest: No basilar airspace disease or pleural effusion. Normal  heart size. Trace pericardial effusion. Hepatobiliary: No focal liver abnormality on unenhanced exam. Punctate calcification within the gallbladder likely gallstone. No pericholecystic inflammation. No biliary dilatation. Pancreas: No ductal dilatation or inflammation. Spleen: Normal in size without focal abnormality. Adrenals/Urinary Tract: Normal adrenal glands. No hydronephrosis. 3 mm nonobstructing stone in the lower right kidney. There are small renal cysts. Mild left renal parenchymal thinning. Partially distended urinary bladder. No bladder wall thickening. Stomach/Bowel: Unremarkable stomach. Tiny intramural lipoma within the fourth portion of the duodenum is unchanged from prior, nonobstructing and of no clinical significance. No small bowel obstruction or inflammation. Administered enteric contrast reaches the colon. Normal air-filled appendix. Moderate colonic stool burden. No colonic wall thickening or inflammation. Vascular/Lymphatic: Advanced aortic atherosclerosis. Aortic tortuosity. No aneurysm. There is no bulky abdominopelvic adenopathy. Reproductive: The previous 6.5 cm left adnexal cyst has diminished in size, currently measuring 4.8 x 4.7 cm, however has increased in density and no longer measures simple fluid. Probable punctate uterine fibroid. Right ovary is quiescent. Other: Small fat containing umbilical hernia. No ascites or free fluid. Musculoskeletal: Diffuse degenerative change throughout the spine. Ill T12 inferior endplate compression fracture, chronic and unchanged from previous. Levo scoliotic curvature of the lumbar spine. Hemi transitional lumbosacral anatomy. No acute osseous abnormalities are seen. IMPRESSION: 1. No acute abnormality in the abdomen/pelvis. 2. Decreased size of previous 6.5  cm left adnexal cyst, currently measuring 4.8 x 4.7 cm, however has increased in density and no longer measures simple fluid. This may represent interval hemorrhage within cyst. Because this  lesion is not adequately characterized, prompt [place] is recommended for further evaluation. Note: This recommendation does not apply to premenarchal patients and to those with increased risk (genetic, family history, elevated tumor markers or other high-risk factors) of ovarian cancer. Reference: JACR 2020 Feb; 17(2):248-254 3. Nonobstructing right renal stone. 4. Cholelithiasis without gallbladder inflammation. Aortic Atherosclerosis (ICD10-I70.0). Electronically Signed   By: Keith Rake M.D.   On: 09/24/2020 22:55   X-ray chest PA and lateral  Result Date: 09/24/2020 CLINICAL DATA:  Pneumonia. EXAM: CHEST - 2 VIEW COMPARISON:  Radiograph yesterday. FINDINGS: Left-sided dialysis catheter remains in place. Mild bronchial thickening without focal airspace disease. Normal heart size and mediastinal contours. No pleural fluid or pneumothorax. No acute osseous abnormalities are seen. IMPRESSION: Mild bronchial thickening without focal airspace disease. Electronically Signed   By: Keith Rake M.D.   On: 09/24/2020 22:56   DG Chest Port 1 View  Result Date: 09/23/2020 CLINICAL DATA:  Weakness EXAM: PORTABLE CHEST 1 VIEW COMPARISON:  Prior chest x-ray 08/20/2013 FINDINGS: Left IJ approach hemodialysis catheter. The catheter tip projects over the mid SVC. Stable cardiac and mediastinal contours. No pleural effusion or pulmonary edema. No evidence of airspace opacity or pneumothorax. No acute osseous abnormality. IMPRESSION: No active disease. Left IJ hemodialysis catheter.  Catheter tip overlies the mid SVC. Electronically Signed   By: Jacqulynn Cadet M.D.   On: 09/23/2020 11:11    ASSESSMENT AND PLAN: 1. TTP diagnosed in 2005, treated with plasma exchange, steroids, and rituximab consolidation ? 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 Relapse of TTP March 2015-plasma exchange, steroids, rituximab, and maintenance azathioprine ? Admission with severe thrombocytopenia and  elevated LDH 07/16/2020, ADAMTS13 activity less than 2%,consistent with relapse of TTP -Steroids/FFP given 07/16/2020 -Daily plasma exchange beginning 07/17/2020, 7 days, then qod starting 3/24, discharge 07/21/2020 on prednisone 60 mg daily -Prednisone taper to 40 mg daily 07/29/2020 -rituximab 07/30/2020, 08/06/2020, 08/13/2020, 08/20/2020 -plasmapheresis on 07/31/2020, 08/03/2020, 08/05/2020, and 08/07/2020 -prednisone increased to 60 mg daily 08/17/2020 -Plasmapheresis 08/24/2020,08/25/2020, 08/26/2020, 08/27/2020, 08/28/2020,08/29/2020, 08/31/2020, 09/02/2020, 09/04/2020 -caplacizumabinitiated 08/24/2020 -Decrease prednisone to 40 mg daily 08/28/2020 -ADAMTS13 37% on 09/04/2020 -Prednisone decreased to 30 mg daily 09/07/2020 -Continue caplacizumab -prednisone decreased to 20 mg daily beginning 09/18/2020 2. History of epidural abscess requiring laminectomy 2012 3. 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 COPD 6. Anemia, progressive- stool positive for blood 7. Hospital admission 09/24/2020- symptomatic anemia, heme positive stools  [name] is admitted secondary to symptomatic anemia with heme positive stools.  GI consult is currently pending.  CT of the abdomen/pelvis without acute abnormality.  The patient has heme positive stools which may be related to her long-term prednisone use and caplacizumab.  Bleeding can be a side effect of caplacizumab.  Hemoglobin this morning is down to 7.6.  We will plan to transfuse 1 unit PRBCs.  Platelets are 204,000.  She has no evidence of TTP.  No schistocytes seen on peripheral blood smear and LDH is decreasing.  She does have some increased reticulocytes which could be appropriate response to GI blood loss.  Recommendations: 1.  Transfuse 2 units PRBCs today. 2.  GI consult, await recommendations. 3.  Monitor daily  CBC and transfuse for symptomatic anemia. 4.  Hold  Caplacizumab and prednisone 5.  Ask interventional radiology to remove the a pheresis catheter   LOS: 1 day   Mikey Bussing, DNP, AGPCNP-BC, AOCNP 09/25/20 Ms. Bushee was interviewed and examined.  She is admitted with severe symptomatic anemia.  The anemia is most likely related to GI bleeding while on caplacizumab.  She is also at risk for bleeding secondary to steroid use.  The CT yesterday also revealed a possible hemorrhagic left adnexal cyst.  I think it is unlikely the TTP is relapsing as she did not have significant anemia with the most recent relapse of TTP and the platelet count remains in the normal range.  Hematology will see her daily.  I was present for greater than 50% of today's visit.  I performed medical decision making.

## 2020-09-25 NOTE — Hospital Course (Addendum)
80 year old woman PMH TTP followed by oncology s.  Seen by oncology with seen by her oncologist and started on caplacizumab 08/24/2020.  Seen by oncology 5/26, will be anemic, directly admitted for evaluation for GI bleed.  A & P  Acute blood loss anemia thought secondary to GI bleed.  Possibly related to prednisone and bleeding as a side effect of caplacizumab. CT no acute abnormality in the abdomen or pelvis.   -- Hgb up to 8.3 after PRBC --GI following, pt has declined EGD for now.  TTP followed by oncology, started on caplacizumab 08/24/2020 --Per oncology not an active issue. Plts stable  Decreased size left adnexal cyst but increased density.  --Consider outpatient ultrasound.  Aortic atherosclerosis --no treatment warranted

## 2020-09-25 NOTE — Progress Notes (Addendum)
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  Seen by oncology with seen by her oncologist and started on caplacizumab 08/24/2020.  Seen by oncology 5/26, will be anemic, directly admitted for evaluation for GI bleed.  A & P  Acute blood loss anemia thought secondary to GI bleed.  Possibly related to prednisone and bleeding as a side effect of caplacizumab. CT no acute abnormality in the abdomen or pelvis.   -- Hemoglobin trending down.  No overt bleeding noted.  Plan for PRBC transfusion as per oncology.  Follow-up posttransfusion CBC. --GI following, pt has declined EGD for now.  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  --Consider outpatient ultrasound.  Aortic atherosclerosis --no treatment warranted  Disposition Plan:  Discussion:   Status is: Inpatient  Remains inpatient appropriate because:Inpatient level of care appropriate due to severity of illness   Dispo: The patient is from: Home              Anticipated d/c is to: Home              Patient currently is not medically stable to d/c.   Difficult to place patient No  DVT prophylaxis: SCDs Start: 09/24/20 1717   Code Status: Full Code Level of care: Med-Surg Family Communication: husband at bedside  Murray Hodgkins, MD  [facility] Direct contact: see [email or web address] (further directions at bottom of note if needed) 7PM-7AM contact night coverage as at bottom of note 09/25/2020, 3:11 PM  LOS: 1 day   Significant Hospital Events   . 5/26 admit GIB   Consults:  . GI . Oncology    Procedures:  .   Significant Diagnostic Tests:  Marland Kitchen    Micro Data:  . 5/26 BC   Antimicrobials:  .   Interval History/Subjective  CC: f/u bleeding  Feels fine today, no bleeding,  hopes to go home soon  Objective   Vitals:  Vitals:   09/25/20 1410 09/25/20 1425  BP: (!) 117/49 (!) 115/45  Pulse: (!) 106 (!) 103  Resp: 20 20  Temp: 98.9 F (37.2 C) 98.4 F (36.9 C)  SpO2: 94% 91%    Exam:  Constitutional:   . Appears calm and comfortable ENMT:  . grossly normal hearing  Respiratory:  . CTA bilaterally, no w/r/r.  . Respiratory effort normal. Cardiovascular:  . RRR, no m/r/g . No LE extremity edema   Abdomen:  . soft Psychiatric:  . Mental status o Mood, affect appropriate  I have personally reviewed the following:   Today's Data  . Hgb 7.6 > 7.0  Scheduled Meds: . ascorbic acid  500 mg Oral Daily  . Chlorhexidine Gluconate Cloth  6 each Topical Daily  . docusate sodium  100 mg Oral BID  . FLUoxetine  20 mg Oral Daily  . folic acid  1 mg Oral Daily  . gabapentin  600 mg Oral TID  . nicotine  14 mg Transdermal Daily  . pantoprazole (PROTONIX) IV  40 mg Intravenous Q12H  . QUEtiapine  25 mg Oral QHS  . rosuvastatin  20 mg Oral QHS  . sodium chloride flush  10-40 mL Intracatheter Q12H  . umeclidinium bromide  1 puff Inhalation Daily  . cyanocobalamin  1,000 mcg Oral Daily   Continuous Infusions:  Principal  Problem:   Acute blood loss anemia Active Problems:   History of TTP (thrombotic thrombocytopenic purpura)   COPD (chronic obstructive pulmonary disease) (HCC)   Gait instability   Tobacco abuse   TTP (thrombotic thrombocytopenic purpura)   Chronic pain disorder   Adnexal cyst   Aortic atherosclerosis (HCC)   LOS: 1 day   How to contact the [facility] Attending or Consulting provider 7A - 7P or covering provider during after hours [place], for this patient?  1. Check the care team in [facility] and look for a) attending/consulting TRH provider listed and b) the [facility] team listed 2. Log into [email or web address] and use [place]'s universal password to access. If you do not have the password, please contact the hospital operator. 3. Locate the  [facility] provider you are looking for under [facility] and page to a number that you can be directly reached. 4. If you still have difficulty reaching the provider, please page the [facility] (Director on Call) for the Hospitalists listed on amion for assistance.

## 2020-09-26 ENCOUNTER — Inpatient Hospital Stay (HOSPITAL_COMMUNITY): Payer: Medicare Other

## 2020-09-26 DIAGNOSIS — M3119 Other thrombotic microangiopathy: Secondary | ICD-10-CM | POA: Diagnosis not present

## 2020-09-26 DIAGNOSIS — D62 Acute posthemorrhagic anemia: Secondary | ICD-10-CM | POA: Diagnosis not present

## 2020-09-26 HISTORY — PX: IR RADIOLOGIST EVAL & MGMT: IMG5224

## 2020-09-26 LAB — CBC WITH DIFFERENTIAL/PLATELET
Abs Immature Granulocytes: 0.13 10*3/uL — ABNORMAL HIGH (ref 0.00–0.07)
Abs Immature Granulocytes: 0.16 10*3/uL — ABNORMAL HIGH (ref 0.00–0.07)
Basophils Absolute: 0 10*3/uL (ref 0.0–0.1)
Basophils Absolute: 0 10*3/uL (ref 0.0–0.1)
Basophils Relative: 1 %
Basophils Relative: 1 %
Eosinophils Absolute: 0.1 10*3/uL (ref 0.0–0.5)
Eosinophils Absolute: 0.1 10*3/uL (ref 0.0–0.5)
Eosinophils Relative: 1 %
Eosinophils Relative: 1 %
HCT: 26.5 % — ABNORMAL LOW (ref 36.0–46.0)
HCT: 25 % — ABNORMAL LOW (ref 36.0–46.0)
Hemoglobin: 8.3 g/dL — ABNORMAL LOW (ref 12.0–15.0)
Hemoglobin: 7.8 g/dL — ABNORMAL LOW (ref 12.0–15.0)
Immature Granulocytes: 2 %
Immature Granulocytes: 3 %
Lymphocytes Relative: 30 %
Lymphocytes Relative: 34 %
Lymphs Abs: 2.1 10*3/uL (ref 0.7–4.0)
Lymphs Abs: 2.2 10*3/uL (ref 0.7–4.0)
MCH: 31.6 pg (ref 26.0–34.0)
MCH: 31.1 pg (ref 26.0–34.0)
MCHC: 31.3 g/dL (ref 30.0–36.0)
MCHC: 31.2 g/dL (ref 30.0–36.0)
MCV: 100.8 fL — ABNORMAL HIGH (ref 80.0–100.0)
MCV: 99.6 fL (ref 80.0–100.0)
Monocytes Absolute: 0.7 10*3/uL (ref 0.1–1.0)
Monocytes Absolute: 0.8 10*3/uL (ref 0.1–1.0)
Monocytes Relative: 11 %
Monocytes Relative: 12 %
Neutro Abs: 3.9 10*3/uL (ref 1.7–7.7)
Neutro Abs: 3.2 10*3/uL (ref 1.7–7.7)
Neutrophils Relative %: 55 %
Neutrophils Relative %: 49 %
Platelets: 175 10*3/uL (ref 150–400)
Platelets: 174 10*3/uL (ref 150–400)
RBC: 2.63 MIL/uL — ABNORMAL LOW (ref 3.87–5.11)
RBC: 2.51 MIL/uL — ABNORMAL LOW (ref 3.87–5.11)
RDW: 17.4 % — ABNORMAL HIGH (ref 11.5–15.5)
RDW: 17.2 % — ABNORMAL HIGH (ref 11.5–15.5)
WBC: 6.9 10*3/uL (ref 4.0–10.5)
WBC: 6.5 10*3/uL (ref 4.0–10.5)
nRBC: 0.9 % — ABNORMAL HIGH (ref 0.0–0.2)
nRBC: 0.5 % — ABNORMAL HIGH (ref 0.0–0.2)

## 2020-09-26 LAB — HEMOGLOBIN AND HEMATOCRIT, BLOOD
HCT: 26.5 % — ABNORMAL LOW (ref 36.0–46.0)
HCT: 26.1 % — ABNORMAL LOW (ref 36.0–46.0)
Hemoglobin: 8.4 g/dL — ABNORMAL LOW (ref 12.0–15.0)
Hemoglobin: 8.3 g/dL — ABNORMAL LOW (ref 12.0–15.0)

## 2020-09-26 MED ORDER — SODIUM CHLORIDE 0.9 % IV SOLN: INTRAVENOUS | Status: DC

## 2020-09-26 NOTE — Progress Notes (Signed)
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  Seen by oncology with seen by her oncologist and started on caplacizumab 08/24/2020.  Seen by oncology 5/26, will be anemic, directly admitted for evaluation for GI bleed.  A & P  Acute blood loss anemia thought secondary to GI bleed.  Possibly related to prednisone and bleeding as a side effect of caplacizumab. CT no acute abnormality in the abdomen or pelvis.   -- Hgb up to 8.3 after PRBC --GI following, pt has declined EGD for now.  TTP followed by oncology, started on caplacizumab 08/24/2020 --Per oncology not an active issue. Plts stable  Decreased size left adnexal cyst but increased density.  --Consider outpatient ultrasound.  Aortic atherosclerosis --no treatment warranted  Disposition Plan:  Discussion: feels well, monitor Hgb, home when cleared by oncology, GI, hopefully 5/29  Status is: Inpatient  Remains inpatient appropriate because:Inpatient level of care appropriate due to severity of illness   Dispo: The patient is from: Home              Anticipated d/c is to: Home              Patient currently is not medically stable to d/c.   Difficult to place patient No  DVT prophylaxis: SCDs Start: 09/24/20 1717   Code Status: Full Code Level of care: Med-Surg Family Communication: husband at bedside  Murray Hodgkins, MD  [facility] Direct contact: see [email or web address] (further directions at bottom of note if needed) 7PM-7AM contact night coverage as at bottom of note 09/26/2020, 1:55 PM  LOS: 2 days   Significant Hospital Events   . 5/26 admit GIB   Consults:  . GI . Oncology    Procedures:  .   Significant Diagnostic Tests:  Marland Kitchen    Micro Data:  . 5/26 BC   Antimicrobials:  .   Interval History/Subjective  CC: f/u bleeding  No complaints.  No bleeding.  Eating  fine.  Objective   Vitals:  Vitals:   09/26/20 0802 09/26/20 1351  BP:  117/60  Pulse:  97  Resp:  17  Temp:  98.1 F (36.7 C)  SpO2: 93% 98%    Exam:  Constitutional:   . Appears calm and comfortable. Sitting up eating lunch. ENMT:  . grossly normal hearing  Respiratory:  . CTA bilaterally, no w/r/r.  . Respiratory effort normal.  Cardiovascular:  . RRR, no m/r/g . No LE extremity edema   Psychiatric:  . Mental status o Mood, affect appropriate  I have personally reviewed the following:   Today's Data  . Hgb up to 8.3 after PRBC and stable  Scheduled Meds: . ascorbic acid  500 mg Oral Daily  . Chlorhexidine Gluconate Cloth  6 each Topical Daily  . docusate sodium  100 mg Oral BID  . FLUoxetine  20 mg Oral Daily  . folic acid  1 mg Oral Daily  . gabapentin  600 mg Oral TID  . nicotine  14 mg Transdermal Daily  . pantoprazole  40 mg Oral BID AC  . QUEtiapine  25 mg Oral QHS  . rosuvastatin  20 mg Oral QHS  . sodium chloride flush  10-40 mL Intracatheter Q12H  . umeclidinium bromide  1 puff Inhalation Daily  . cyanocobalamin  1,000 mcg Oral Daily   Continuous Infusions:  Principal Problem:   Acute blood loss  anemia Active Problems:   History of TTP (thrombotic thrombocytopenic purpura)   COPD (chronic obstructive pulmonary disease) (HCC)   Gait instability   Tobacco abuse   TTP (thrombotic thrombocytopenic purpura)   Chronic pain disorder   Adnexal cyst   Aortic atherosclerosis (HCC)   LOS: 2 days   How to contact the [facility] Attending or Consulting provider [place] or covering provider during after hours [place], for this patient?  1. Check the care team in [facility] and look for a) attending/consulting TRH provider listed and b) the [facility] team listed 2. Log into [email or web address] and use [place]'s universal password to access. If you do not have the password, please contact the hospital operator. 3. Locate the [facility] provider you are looking for under [facility] and page to a number that you can be directly reached. 4. If you still have difficulty reaching the provider, please page the [facility] (Director on Call) for the Hospitalists listed on amion for assistance.

## 2020-09-26 NOTE — Progress Notes (Signed)
Patient's hemoglobin is stable.  I have cut back the patient's blood drawing to twice a day.  After discussion with her covering hematologist, who strongly recommended endoscopic evaluation, the patient is agreeable to proceeding with that exam, which will be scheduled for tomorrow.  Petra Kuba, purpose, and risks reviewed with patient and husband at bedside, and they are agreeable to proceed, recognizing that there is a relatively low likelihood we will find a specific, treatable localized lesion to account for her bleeding..  The patient has never had colonoscopy so, depending on the endoscopic findings and the patient's clinical evolution following discharge (for example presence or absence of ongoing blood in the stool, normalization or failure to normalize her hemoglobin), we might have to consider colonoscopy as an outpatient subsequently.  Cleotis Nipper, M.D. Pager [phone] If no answer or after 5 PM call [phone]

## 2020-09-26 NOTE — Progress Notes (Signed)
HEMATOLOGY-ONCOLOGY PROGRESS NOTE  SUBJECTIVE: [name] reports that she feels well today.  She currently denies any fevers, chills, sweats, nausea, or diarrhea.  She denies any overt signs of bleeding, though she knows she has not had a bowel movement since admission.  She is here to go home unfortunately her hemoglobin trended down to 8.3 from 8.9 yesterday evening.  I had the opportunity to speak with her and her daughter and asked them to reconsider GI intervention in order to assure that there is no source of bleeding that can be intervened upon.  She currently denies any fevers, chills, sweats, nausea, vomiting or diarrhea.  PHYSICAL EXAMINATION:  Vitals:   09/26/20 0802 09/26/20 1351  BP:  117/60  Pulse:  97  Resp:  17  Temp:  98.1 F (36.7 C)  SpO2: 93% 98%   There were no vitals filed for this visit.  Intake/Output from previous day: 05/27 0701 - 05/28 0700 In: 1038.8 [P.O.:600; I.V.:49.2; Blood:389.6] Out: 1500 [Urine:1500]  GENERAL:alert, no distress and comfortable LUNGS: clear to ascultation bilaterally.  HEART: regular rate & rhythm and no murmurs ABDOMEN:abdomen soft, non-tender and normal bowel sounds NEURO: alert & oriented x 3 with fluent speech  Catheter left neck without erythema  LABORATORY DATA:  I have reviewed the data as listed CMP Latest Ref Rng & Units 09/25/2020 09/24/2020 09/23/2020  Glucose 70 - 99 mg/dL 98 102(H) 104(H)  BUN 8 - 23 mg/dL 23 24(H) 25(H)  Creatinine 0.44 - 1.00 mg/dL 0.73 0.73 0.84  Sodium 135 - 145 mmol/L 141 140 139  Potassium 3.5 - 5.1 mmol/L 3.9 3.9 3.9  Chloride 98 - 111 mmol/L 111 106 109  CO2 22 - 32 mmol/L 24 25 26   Calcium 8.9 - 10.3 mg/dL 8.7(L) 8.3(L) 8.7(L)  Total Protein 6.5 - 8.1 g/dL 5.4(L) 5.9(L) 5.6(L)  Total Bilirubin 0.3 - 1.2 mg/dL 0.4 0.5 0.6  Alkaline Phos 38 - 126 U/L 35(L) 41 37(L)  AST 15 - 41 U/L 30 25 30   ALT 0 - 44 U/L 40 37 38    Lab Results  Component Value Date   WBC 6.9 09/26/2020   HGB 8.3  (L) 09/26/2020   HCT 26.5 (L) 09/26/2020   MCV 100.8 (H) 09/26/2020   PLT 175 09/26/2020   NEUTROABS 3.9 09/26/2020    CT ABDOMEN PELVIS WO CONTRAST  Result Date: 09/24/2020 CLINICAL DATA:  Acute abdominal pain. EXAM: CT ABDOMEN AND PELVIS WITHOUT CONTRAST TECHNIQUE: Multidetector CT imaging of the abdomen and pelvis was performed following the standard protocol without IV contrast. COMPARISON:  Abdominopelvic CT 04/08/2017 FINDINGS: Lower chest: No basilar airspace disease or pleural effusion. Normal heart size. Trace pericardial effusion. Hepatobiliary: No focal liver abnormality on unenhanced exam. Punctate calcification within the gallbladder likely gallstone. No pericholecystic inflammation. No biliary dilatation. Pancreas: No ductal dilatation or inflammation. Spleen: Normal in size without focal abnormality. Adrenals/Urinary Tract: Normal adrenal glands. No hydronephrosis. 3 mm nonobstructing stone in the lower right kidney. There are small renal cysts. Mild left renal parenchymal thinning. Partially distended urinary bladder. No bladder wall thickening. Stomach/Bowel: Unremarkable stomach. Tiny intramural lipoma within the fourth portion of the duodenum is unchanged from prior, nonobstructing and of no clinical significance. No small bowel obstruction or inflammation. Administered enteric contrast reaches the colon. Normal air-filled appendix. Moderate colonic stool burden. No colonic wall thickening or inflammation. Vascular/Lymphatic: Advanced aortic atherosclerosis. Aortic tortuosity. No aneurysm. There is no bulky abdominopelvic adenopathy. Reproductive: The previous 6.5 cm left adnexal cyst has diminished  in size, currently measuring 4.8 x 4.7 cm, however has increased in density and no longer measures simple fluid. Probable punctate uterine fibroid. Right ovary is quiescent. Other: Small fat containing umbilical hernia. No ascites or free fluid. Musculoskeletal: Diffuse degenerative change  throughout the spine. Ill T12 inferior endplate compression fracture, chronic and unchanged from previous. Levo scoliotic curvature of the lumbar spine. Hemi transitional lumbosacral anatomy. No acute osseous abnormalities are seen. IMPRESSION: 1. No acute abnormality in the abdomen/pelvis. 2. Decreased size of previous 6.5 cm left adnexal cyst, currently measuring 4.8 x 4.7 cm, however has increased in density and no longer measures simple fluid. This may represent interval hemorrhage within cyst. Because this lesion is not adequately characterized, prompt [place] is recommended for further evaluation. Note: This recommendation does not apply to premenarchal patients and to those with increased risk (genetic, family history, elevated tumor markers or other high-risk factors) of ovarian cancer. Reference: JACR 2020 Feb; 17(2):248-254 3. Nonobstructing right renal stone. 4. Cholelithiasis without gallbladder inflammation. Aortic Atherosclerosis (ICD10-I70.0). Electronically Signed   By: Keith Rake M.D.   On: 09/24/2020 22:55   X-ray chest PA and lateral  Result Date: 09/24/2020 CLINICAL DATA:  Pneumonia. EXAM: CHEST - 2 VIEW COMPARISON:  Radiograph yesterday. FINDINGS: Left-sided dialysis catheter remains in place. Mild bronchial thickening without focal airspace disease. Normal heart size and mediastinal contours. No pleural fluid or pneumothorax. No acute osseous abnormalities are seen. IMPRESSION: Mild bronchial thickening without focal airspace disease. Electronically Signed   By: Keith Rake M.D.   On: 09/24/2020 22:56   DG Chest Port 1 View  Result Date: 09/23/2020 CLINICAL DATA:  Weakness EXAM: PORTABLE CHEST 1 VIEW COMPARISON:  Prior chest x-ray 08/20/2013 FINDINGS: Left IJ approach hemodialysis catheter. The catheter tip projects over the mid SVC. Stable cardiac and mediastinal contours. No pleural effusion or pulmonary edema. No evidence of airspace opacity or pneumothorax. No acute osseous  abnormality. IMPRESSION: No active disease. Left IJ hemodialysis catheter.  Catheter tip overlies the mid SVC. Electronically Signed   By: Jacqulynn Cadet M.D.   On: 09/23/2020 11:11    ASSESSMENT AND PLAN: 1. TTP diagnosed in 2005, treated with plasma exchange, steroids, and rituximab consolidation ? 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 Relapse of TTP March 2015-plasma exchange, steroids, rituximab, and maintenance azathioprine ? Admission with severe thrombocytopenia and elevated LDH 07/16/2020, ADAMTS13 activity less than 2%,consistent with relapse of TTP -Steroids/FFP given 07/16/2020 -Daily plasma exchange beginning 07/17/2020, 7 days, then qod starting 3/24, discharge 07/21/2020 on prednisone 60 mg daily -Prednisone taper to 40 mg daily 07/29/2020 -rituximab 07/30/2020, 08/06/2020, 08/13/2020, 08/20/2020 -plasmapheresis on 07/31/2020, 08/03/2020, 08/05/2020, and 08/07/2020 -prednisone increased to 60 mg daily 08/17/2020 -Plasmapheresis 08/24/2020,08/25/2020, 08/26/2020, 08/27/2020, 08/28/2020,08/29/2020, 08/31/2020, 09/02/2020, 09/04/2020 -caplacizumabinitiated 08/24/2020 -Decrease prednisone to 40 mg daily 08/28/2020 -ADAMTS13 37% on 09/04/2020 -Prednisone decreased to 30 mg daily 09/07/2020 -Continue caplacizumab -prednisone decreased to 20 mg daily beginning 09/18/2020 2. History of epidural abscess requiring laminectomy 2012 3. 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 COPD 6. Anemia, progressive- stool positive for blood 7. Hospital admission 09/24/2020- symptomatic anemia, heme positive stools  Ms. Fredericks is admitted secondary to symptomatic anemia with heme positive stools.  GI consult is currently pending.  CT of the abdomen/pelvis without acute abnormality.  The patient has heme positive stools which may be related  to her long-term prednisone use and caplacizumab.  Bleeding can be a side  effect of caplacizumab.  Hemoglobin this morning is up to 8.3.  No need for a transfusion today.  Platelets are 175,000.  She has no evidence of TTP.  No schistocytes seen on prior peripheral blood smear and LDH is decreasing.  She does have some increased reticulocytes which could be appropriate response to GI blood loss.  Recommendations: 1.  No need for transfusion today with Hgb >8.0 2.  GI consulted, but patient/daughter decline endoscopy at this time. Strongly recommend the patient reconsider and have this procedure done 3.  Monitor daily CBC and transfuse for symptomatic anemia or Hgb <7.0. 4.  Hold Caplacizumab and prednisone 5.  Pheresis catheter has been removed   LOS: 2 days   Ledell Peoples, MD Department of Hematology/Oncology [place] [facility] Phone: [phone] Pager: [phone] Email: [email or web address]

## 2020-09-26 NOTE — Procedures (Signed)
Pre procedural Dx: plasma pheresis Post procedural Dx: Same  Successful removal of nontunneled catheter. Catheter removed intact   EBL: None No immediate complications.  Please see imaging section of Epic for full dictation.  Jacqualine Mau NP 09/26/2020 3:02 PM

## 2020-09-27 ENCOUNTER — Inpatient Hospital Stay (HOSPITAL_COMMUNITY): Payer: Medicare Other | Admitting: Anesthesiology

## 2020-09-27 ENCOUNTER — Encounter (HOSPITAL_COMMUNITY): Payer: Self-pay | Admitting: Family Medicine

## 2020-09-27 ENCOUNTER — Encounter (HOSPITAL_COMMUNITY)
Admission: AD | Disposition: A | Discharge status: Home / Self Care | Payer: Self-pay | Source: Ambulatory Visit | Attending: Family Medicine

## 2020-09-27 DIAGNOSIS — N949 Unspecified condition associated with female genital organs and menstrual cycle: Secondary | ICD-10-CM | POA: Diagnosis not present

## 2020-09-27 DIAGNOSIS — D500 Iron deficiency anemia secondary to blood loss (chronic): Secondary | ICD-10-CM | POA: Diagnosis not present

## 2020-09-27 DIAGNOSIS — D62 Acute posthemorrhagic anemia: Secondary | ICD-10-CM | POA: Diagnosis not present

## 2020-09-27 DIAGNOSIS — M3119 Other thrombotic microangiopathy: Secondary | ICD-10-CM | POA: Diagnosis not present

## 2020-09-27 HISTORY — PX: ESOPHAGOGASTRODUODENOSCOPY (EGD) WITH PROPOFOL: SHX5813

## 2020-09-27 LAB — CBC WITH DIFFERENTIAL/PLATELET
Abs Immature Granulocytes: 0.12 10*3/uL — ABNORMAL HIGH (ref 0.00–0.07)
Basophils Absolute: 0 10*3/uL (ref 0.0–0.1)
Basophils Relative: 1 %
Eosinophils Absolute: 0.1 10*3/uL (ref 0.0–0.5)
Eosinophils Relative: 2 %
HCT: 26.2 % — ABNORMAL LOW (ref 36.0–46.0)
Hemoglobin: 8 g/dL — ABNORMAL LOW (ref 12.0–15.0)
Immature Granulocytes: 2 %
Lymphocytes Relative: 36 %
Lymphs Abs: 2.3 10*3/uL (ref 0.7–4.0)
MCH: 30.7 pg (ref 26.0–34.0)
MCHC: 30.5 g/dL (ref 30.0–36.0)
MCV: 100.4 fL — ABNORMAL HIGH (ref 80.0–100.0)
Monocytes Absolute: 0.8 10*3/uL (ref 0.1–1.0)
Monocytes Relative: 12 %
Neutro Abs: 3.1 10*3/uL (ref 1.7–7.7)
Neutrophils Relative %: 47 %
Platelets: 180 10*3/uL (ref 150–400)
RBC: 2.61 MIL/uL — ABNORMAL LOW (ref 3.87–5.11)
RDW: 17 % — ABNORMAL HIGH (ref 11.5–15.5)
WBC: 6.4 10*3/uL (ref 4.0–10.5)
nRBC: 0.5 % — ABNORMAL HIGH (ref 0.0–0.2)

## 2020-09-27 SURGERY — ESOPHAGOGASTRODUODENOSCOPY (EGD) WITH PROPOFOL
Anesthesia: Monitor Anesthesia Care

## 2020-09-27 MED ORDER — PROPOFOL 500 MG/50ML IV EMUL
INTRAVENOUS | Status: DC | PRN
  Administered 2020-09-27: 120 ug/kg/min via INTRAVENOUS

## 2020-09-27 MED ORDER — SODIUM CHLORIDE 0.9 % IV SOLN: INTRAVENOUS | Status: DC

## 2020-09-27 MED ORDER — TRAMADOL HCL 50 MG PO TABS: 50.0000 mg | ORAL_TABLET | Freq: Two times a day (BID) | ORAL | Status: DC

## 2020-09-27 MED ORDER — LIDOCAINE HCL (CARDIAC) PF 100 MG/5ML IV SOSY
PREFILLED_SYRINGE | INTRAVENOUS | Status: DC | PRN
  Administered 2020-09-27: 100 mg via INTRAVENOUS

## 2020-09-27 MED ORDER — PROPOFOL 10 MG/ML IV BOLUS
INTRAVENOUS | Status: DC | PRN
  Administered 2020-09-27: 20 mg via INTRAVENOUS
  Administered 2020-09-27 (×3): 10 mg via INTRAVENOUS

## 2020-09-27 MED ORDER — PHENYLEPHRINE 40 MCG/ML (10ML) SYRINGE FOR IV PUSH (FOR BLOOD PRESSURE SUPPORT)
PREFILLED_SYRINGE | INTRAVENOUS | Status: DC | PRN
  Administered 2020-09-27 (×3): 80 ug via INTRAVENOUS

## 2020-09-27 SURGICAL SUPPLY — 15 items

## 2020-09-27 NOTE — Progress Notes (Signed)
HEMATOLOGY-ONCOLOGY PROGRESS NOTE  SUBJECTIVE: [name] reports that she feels well today.  She is accompanied by her daughter today.  She reports that she is very eager to go home.  She had a bowel movement this morning which was "small and black".  She notes that it was not tarry or loose.  She has not been having any issues with lightheadedness, dizziness, or shortness of breath.  She currently denies any fevers, chills, sweats, nausea, vomiting or diarrhea.  She and her daughter are requesting discharge today.  PHYSICAL EXAMINATION:  Vitals:   09/27/20 0843 09/27/20 0853  BP: (!) 102/33 (!) 115/41  Pulse: 85 88  Resp: 19 (!) 24  Temp:    SpO2: 95% 99%   Filed Weights   09/27/20 0746  Weight: 161 lb (73 kg)    Intake/Output from previous day: 05/28 0701 - 05/29 0700 In: 840 [P.O.:840] Out: 1100 [Urine:1100]  GENERAL:alert, no distress and comfortable LUNGS: clear to ascultation bilaterally.  HEART: regular rate & rhythm and no murmurs ABDOMEN:abdomen soft, non-tender and normal bowel sounds NEURO: alert & oriented x 3 with fluent speech  Catheter left neck without erythema  LABORATORY DATA:  I have reviewed the data as listed CMP Latest Ref Rng & Units 09/25/2020 09/24/2020 09/23/2020  Glucose 70 - 99 mg/dL 98 102(H) 104(H)  BUN 8 - 23 mg/dL 23 24(H) 25(H)  Creatinine 0.44 - 1.00 mg/dL 0.73 0.73 0.84  Sodium 135 - 145 mmol/L 141 140 139  Potassium 3.5 - 5.1 mmol/L 3.9 3.9 3.9  Chloride 98 - 111 mmol/L 111 106 109  CO2 22 - 32 mmol/L 24 25 26   Calcium 8.9 - 10.3 mg/dL 8.7(L) 8.3(L) 8.7(L)  Total Protein 6.5 - 8.1 g/dL 5.4(L) 5.9(L) 5.6(L)  Total Bilirubin 0.3 - 1.2 mg/dL 0.4 0.5 0.6  Alkaline Phos 38 - 126 U/L 35(L) 41 37(L)  AST 15 - 41 U/L 30 25 30   ALT 0 - 44 U/L 40 37 38    Lab Results  Component Value Date   WBC 6.4 09/27/2020   HGB 8.0 (L) 09/27/2020   HCT 26.2 (L) 09/27/2020   MCV 100.4 (H) 09/27/2020   PLT 180 09/27/2020   NEUTROABS 3.1 09/27/2020     CT ABDOMEN PELVIS WO CONTRAST  Result Date: 09/24/2020 CLINICAL DATA:  Acute abdominal pain. EXAM: CT ABDOMEN AND PELVIS WITHOUT CONTRAST TECHNIQUE: Multidetector CT imaging of the abdomen and pelvis was performed following the standard protocol without IV contrast. COMPARISON:  Abdominopelvic CT 04/08/2017 FINDINGS: Lower chest: No basilar airspace disease or pleural effusion. Normal heart size. Trace pericardial effusion. Hepatobiliary: No focal liver abnormality on unenhanced exam. Punctate calcification within the gallbladder likely gallstone. No pericholecystic inflammation. No biliary dilatation. Pancreas: No ductal dilatation or inflammation. Spleen: Normal in size without focal abnormality. Adrenals/Urinary Tract: Normal adrenal glands. No hydronephrosis. 3 mm nonobstructing stone in the lower right kidney. There are small renal cysts. Mild left renal parenchymal thinning. Partially distended urinary bladder. No bladder wall thickening. Stomach/Bowel: Unremarkable stomach. Tiny intramural lipoma within the fourth portion of the duodenum is unchanged from prior, nonobstructing and of no clinical significance. No small bowel obstruction or inflammation. Administered enteric contrast reaches the colon. Normal air-filled appendix. Moderate colonic stool burden. No colonic wall thickening or inflammation. Vascular/Lymphatic: Advanced aortic atherosclerosis. Aortic tortuosity. No aneurysm. There is no bulky abdominopelvic adenopathy. Reproductive: The previous 6.5 cm left adnexal cyst has diminished in size, currently measuring 4.8 x 4.7 cm, however has increased in density and  no longer measures simple fluid. Probable punctate uterine fibroid. Right ovary is quiescent. Other: Small fat containing umbilical hernia. No ascites or free fluid. Musculoskeletal: Diffuse degenerative change throughout the spine. Ill T12 inferior endplate compression fracture, chronic and unchanged from previous. Levo scoliotic  curvature of the lumbar spine. Hemi transitional lumbosacral anatomy. No acute osseous abnormalities are seen. IMPRESSION: 1. No acute abnormality in the abdomen/pelvis. 2. Decreased size of previous 6.5 cm left adnexal cyst, currently measuring 4.8 x 4.7 cm, however has increased in density and no longer measures simple fluid. This may represent interval hemorrhage within cyst. Because this lesion is not adequately characterized, prompt [place] is recommended for further evaluation. Note: This recommendation does not apply to premenarchal patients and to those with increased risk (genetic, family history, elevated tumor markers or other high-risk factors) of ovarian cancer. Reference: JACR 2020 Feb; 17(2):248-254 3. Nonobstructing right renal stone. 4. Cholelithiasis without gallbladder inflammation. Aortic Atherosclerosis (ICD10-I70.0). Electronically Signed   By: Keith Rake M.D.   On: 09/24/2020 22:55   X-ray chest PA and lateral  Result Date: 09/24/2020 CLINICAL DATA:  Pneumonia. EXAM: CHEST - 2 VIEW COMPARISON:  Radiograph yesterday. FINDINGS: Left-sided dialysis catheter remains in place. Mild bronchial thickening without focal airspace disease. Normal heart size and mediastinal contours. No pleural fluid or pneumothorax. No acute osseous abnormalities are seen. IMPRESSION: Mild bronchial thickening without focal airspace disease. Electronically Signed   By: Keith Rake M.D.   On: 09/24/2020 22:56   DG Chest Port 1 View  Result Date: 09/23/2020 CLINICAL DATA:  Weakness EXAM: PORTABLE CHEST 1 VIEW COMPARISON:  Prior chest x-ray 08/20/2013 FINDINGS: Left IJ approach hemodialysis catheter. The catheter tip projects over the mid SVC. Stable cardiac and mediastinal contours. No pleural effusion or pulmonary edema. No evidence of airspace opacity or pneumothorax. No acute osseous abnormality. IMPRESSION: No active disease. Left IJ hemodialysis catheter.  Catheter tip overlies the mid SVC.  Electronically Signed   By: Jacqulynn Cadet M.D.   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 No longer needed. Request is for catheter removal. EXAM: REMOVAL TUNNELED CENTRAL VENOUS CATHETER MEDICATIONS: No antibiotic was indicated for this procedure. ANESTHESIA/SEDATION: Moderate (conscious) sedation was not employed during this procedure. FLUOROSCOPY TIME:  Fluoroscopy Time: none COMPLICATIONS: None immediate. PROCEDURE: Informed verbal consent was obtained from the patient after a thorough discussion of the procedural risks, benefits and alternatives. All questions were addressed. Maximal Sterile Barrier Technique was utilized including caps, mask, sterile gowns, sterile gloves, sterile drape, hand hygiene and skin antiseptic. A timeout was performed prior to the initiation of the procedure. The patient's right neck and catheter was prepped and draped in a normal sterile fashion. Heparin was removed from both ports of catheter. The sutures were cut and the catheter was removed in it's entirety. Pressure was held till hemostasis was obtained. A sterile dressing was applied. The patient tolerated the procedure well with no immediate complications. IMPRESSION: Successful non tunneled dialysis catheter removal as described above. Read by: Rushie Nyhan, NP Electronically Signed   By: Sandi Mariscal M.D.   On: 09/26/2020 15:06    ASSESSMENT AND PLAN: 1. TTP diagnosed in 2005, treated with plasma exchange, steroids, and rituximab consolidation ? 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 Relapse of TTP March 2015-plasma exchange, steroids, rituximab, and maintenance azathioprine ? Admission with severe thrombocytopenia and elevated LDH 07/16/2020,  ADAMTS13 activity less than 2%,consistent with relapse of TTP -Steroids/FFP given  07/16/2020 -Daily plasma exchange beginning 07/17/2020, 7 days, then qod starting 3/24, discharge 07/21/2020 on prednisone 60 mg daily -Prednisone taper to 40 mg daily 07/29/2020 -rituximab 07/30/2020, 08/06/2020, 08/13/2020, 08/20/2020 -plasmapheresis on 07/31/2020, 08/03/2020, 08/05/2020, and 08/07/2020 -prednisone increased to 60 mg daily 08/17/2020 -Plasmapheresis 08/24/2020,08/25/2020, 08/26/2020, 08/27/2020, 08/28/2020,08/29/2020, 08/31/2020, 09/02/2020, 09/04/2020 -caplacizumabinitiated 08/24/2020 -Decrease prednisone to 40 mg daily 08/28/2020 -ADAMTS13 37% on 09/04/2020 -Prednisone decreased to 30 mg daily 09/07/2020 -Continue caplacizumab -prednisone decreased to 20 mg daily beginning 09/18/2020 2. History of epidural abscess requiring laminectomy 2012 3. 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 COPD 6. Anemia, progressive- stool positive for blood 7. Hospital admission 09/24/2020- symptomatic anemia, heme positive stools  [name] is admitted secondary to symptomatic anemia with heme positive stools.  GI consult is currently pending.  CT of the abdomen/pelvis without acute abnormality.  The patient has heme positive stools which may be related to her long-term prednisone use and caplacizumab.  Bleeding can be a side effect of caplacizumab.  Hemoglobin this morning is up to 8.3.  No need for a transfusion today.  Platelets are 175,000.  She has no evidence of TTP.  No schistocytes seen on prior peripheral blood smear and LDH is decreasing.  She does have some increased reticulocytes which could be appropriate response to GI blood loss.  Recommendations: 1.  No need for transfusion today with Hgb >8.0 2.  GI performed endoscopy, no clear source of bleeding noted.  3.  Hgb is stable today at 8.0 Vitals stable.  4.  Hold Caplacizumab and prednisone 5.  Pheresis  catheter has been removed 6. Given the lack of a clear source of bleeding and stable labs the patient and her daughter are requesting d/c. I think it is reasonable to continue her workup in the outpatient setting given her clinical stability.    LOS: 3 days   Ledell Peoples, MD Department of Hematology/Oncology [place] at [facility] Phone: [phone] Pager: [phone] Email: [email or web address]

## 2020-09-27 NOTE — Discharge Summary (Signed)
Physician Discharge Summary  [name] YCX:[id number] DOB: Sep 27, 1940 DOA: 09/24/2020  PCP: Kirk Ruths, MD  Admit date: 09/24/2020 Discharge date: 09/27/2020  Recommendations for Outpatient Follow-up:    The patient has never had colon cancer screening.  She should discuss with her primary physician and her oncologist whether she wants to have any sort of screening.  Options might include Cologuard, annual Hemoccult testing, virtual colonoscopy, or a standard colonoscopy.  TTP followed by oncology, started on caplacizumab 08/24/2020 -- Prednisone, Imuran, caplacizumab on hold, deferred to Dr. Benay Spice.   Decreased size of previous 6.5 cm left adnexal cyst, currently measuring 4.8 x 4.7 cm, however has increased in density and no longer measures simple fluid. This may represent interval hemorrhage within cyst. Because this lesion is not adequately characterized, prompt [place] is recommended for further evaluation. Note: This recommendation does not apply to premenarchal patients and to those with increased risk (genetic, family history, elevated tumor markers or other high-risk factors) of ovarian cancer. Reference: JACR 2020 Feb; 17(2):248-254  Aortic atherosclerosis --no treatment warranted   Follow-up Information    Kirk Ruths, MD. Schedule an appointment as soon as possible for a visit in 2 week(s).   Specialty: Internal Medicine Contact information: [place] [phone] [phone]        Ladell Pier, MD Follow up.   Specialty: Oncology Why: Office will call you with appointment Contact information: [place] [address] [phone]                Discharge Diagnoses: Principal diagnosis is #1 Principal Problem:   Acute blood loss anemia Active Problems:   History of TTP (thrombotic thrombocytopenic purpura)   COPD (chronic obstructive pulmonary disease)  (HCC)   Gait instability   Tobacco abuse   TTP (thrombotic thrombocytopenic purpura)   Chronic pain disorder   Adnexal cyst   Aortic atherosclerosis ([place])  Discharge Condition: improved Disposition: home  Diet recommendation:  Diet Orders (From admission, onward)    Start     Ordered   09/27/20 0932  Diet regular Room service appropriate? Yes; Fluid consistency: Thin  Diet effective now       Question Answer Comment  Room service appropriate? Yes   Fluid consistency: Thin      09/27/20 0931   09/27/20 0000  Diet general        09/27/20 1052           Filed Weights   09/27/20 0746  Weight: 73 kg    HPI/Hospital Course:   80 year old woman PMH TTP followed by oncology. Was seen by her oncologist and started on caplacizumab 08/24/2020.  Seen by oncology 5/26, noted to be anemic, directly admitted for evaluation for GI bleed.  No evident bleeding during hospitalization other stools were black.  Responded well to the transfusion.  Seen by GI with unremarkable endoscopy.  GI bleed thought to be side effect caplacizumab.  This was stopped as was prednisone and Imuran on discharge, can resume necessary agents per hematology in the outpatient setting.  Message sent to Dr. Benay Spice to apprise him of discharge and help medications which can be restarted at his discretion.  Acute blood loss anemia thought secondary to GI bleed.  Presumed related to prednisone and bleeding as a side effect of caplacizumab. CT no acute abnormality in the abdomen or pelvis.   --Hemoglobin appropriately increased after PRBC transfusion. -- EGD unrevealing.  Cleared for  discharge by gastroenterology.  No need for PPI or other antiepileptic therapy.  Consider outpatient colon cancer screening.  TTP followed by oncology, started on caplacizumab 08/24/2020 --Per oncology not an active issue. Plts stable  Decreased size left adnexal cyst but increased density.  --Consider outpatient ultrasound.  Aortic  atherosclerosis --no treatment warranted   Consults:  . GI . Hematology    Procedures:  . EGD Impression:               - No active bleeding or blood in stomach, nor any                            prospective bleeding site, seen on this exam.                           - Non-obstructing and mild Schatzki ring.                           - 2 cm hiatal hernia.                           - Normal stomach.                           - Normal examined duodenum.                           - No specimens collected.  Today's assessment: S: CC: f/u bleeding  Feels well, no bleeding. Wants to go home.  O: Vitals:  Vitals:   09/27/20 0843 09/27/20 0853  BP: (!) 102/33 (!) 115/41  Pulse: 85 88  Resp: 19 (!) 24  Temp:    SpO2: 95% 99%    Constitutional:  . Appears calm and comfortable ENMT:  . grossly normal hearing  Respiratory:  . CTA bilaterally, no w/r/r.  . Respiratory effort normal. Cardiovascular:  . RRR, no m/r/g Psychiatric:  . Mental status o Mood, affect appropriate  Hgb stable 8.0  Discharge Instructions  Discharge Instructions    Diet general   Complete by: As directed    Discharge instructions   Complete by: As directed    Call your physician or seek immediate medical attention for bleeding, pain, swelling, confusion, fever or worsening of condition.   Increase activity slowly   Complete by: As directed      Allergies as of 09/27/2020      Reactions   Adhesive [tape] Other (See Comments)   Blisters, Band-Aids brand rips off the skin- paper tape only!!   Cefuroxime Axetil Anxiety, Other (See Comments)   Made the patient jittery   Other Hives, Other (See Comments)   Wool: Reaction is hives [employer] tape: Reaction is blistering   Sulfa Antibiotics Hives   Sulfa Drugs Cross Reactors Hives      Medication List    STOP taking these medications   azaTHIOprine 50 MG tablet Commonly known as: IMURAN   caplacizumab 11 MG Kit  injection Commonly known as: CABLIVI   predniSONE 10 MG tablet Commonly known as: DELTASONE     TAKE these medications   ascorbic acid 500 MG tablet Commonly known as: VITAMIN C Take 500 mg by mouth daily.   cholecalciferol 10 MCG (400 UNIT) Tabs tablet  Commonly known as: VITAMIN D3 Take 400 Units by mouth daily.   cyanocobalamin 1000 MCG tablet Take 1,000 mcg by mouth daily.   FLUoxetine 20 MG capsule Commonly known as: PROZAC Take 20 mg by mouth daily.   folic acid 1 MG tablet Commonly known as: FOLVITE Take 1 mg by mouth daily.   gabapentin 600 MG tablet Commonly known as: NEURONTIN Take 600 mg by mouth 3 (three) times daily.   multivitamin tablet Take 1 tablet by mouth daily.   NICODERM CQ TD Place 1 patch onto the skin See admin instructions. Apply 1 patch onto the skin daily- remove as directed- ONLY WHEN HOSPITALIZED   polyethylene glycol 17 g packet Commonly known as: MIRALAX / GLYCOLAX Take 17 g by mouth daily. What changed:   when to take this  reasons to take this   QUEtiapine 25 MG tablet Commonly known as: SEROQUEL Take 25 mg by mouth at bedtime.   rosuvastatin 20 MG tablet Commonly known as: CRESTOR Take 20 mg by mouth at bedtime.   tiotropium 18 MCG inhalation capsule Commonly known as: SPIRIVA Place 18 mcg into inhaler and inhale daily.   traMADol 50 MG tablet Commonly known as: ULTRAM Take 1 tablet (50 mg total) by mouth 2 (two) times daily.      Allergies  Allergen Reactions  . Adhesive [Tape] Other (See Comments)    Blisters, Band-Aids brand rips off the skin- paper tape only!!  . Cefuroxime Axetil Anxiety and Other (See Comments)    Made the patient jittery  . Other Hives and Other (See Comments)    Wool: Reaction is hives [employer] tape: Reaction is blistering   . Sulfa Antibiotics Hives  . Sulfa Drugs Cross Reactors Hives    The results of significant diagnostics from this hospitalization (including  imaging, microbiology, ancillary and laboratory) are listed below for reference.    Significant Diagnostic Studies: CT ABDOMEN PELVIS WO CONTRAST  Result Date: 09/24/2020 CLINICAL DATA:  Acute abdominal pain. EXAM: CT ABDOMEN AND PELVIS WITHOUT CONTRAST TECHNIQUE: Multidetector CT imaging of the abdomen and pelvis was performed following the standard protocol without IV contrast. COMPARISON:  Abdominopelvic CT 04/08/2017 FINDINGS: Lower chest: No basilar airspace disease or pleural effusion. Normal heart size. Trace pericardial effusion. Hepatobiliary: No focal liver abnormality on unenhanced exam. Punctate calcification within the gallbladder likely gallstone. No pericholecystic inflammation. No biliary dilatation. Pancreas: No ductal dilatation or inflammation. Spleen: Normal in size without focal abnormality. Adrenals/Urinary Tract: Normal adrenal glands. No hydronephrosis. 3 mm nonobstructing stone in the lower right kidney. There are small renal cysts. Mild left renal parenchymal thinning. Partially distended urinary bladder. No bladder wall thickening. Stomach/Bowel: Unremarkable stomach. Tiny intramural lipoma within the fourth portion of the duodenum is unchanged from prior, nonobstructing and of no clinical significance. No small bowel obstruction or inflammation. Administered enteric contrast reaches the colon. Normal air-filled appendix. Moderate colonic stool burden. No colonic wall thickening or inflammation. Vascular/Lymphatic: Advanced aortic atherosclerosis. Aortic tortuosity. No aneurysm. There is no bulky abdominopelvic adenopathy. Reproductive: The previous 6.5 cm left adnexal cyst has diminished in size, currently measuring 4.8 x 4.7 cm, however has increased in density and no longer measures simple fluid. Probable punctate uterine fibroid. Right ovary is quiescent. Other: Small fat containing umbilical hernia. No ascites or free fluid. Musculoskeletal: Diffuse degenerative change throughout  the spine. Ill T12 inferior endplate compression fracture, chronic and unchanged from previous. Levo scoliotic curvature of the lumbar spine. Hemi transitional lumbosacral anatomy. No acute  osseous abnormalities are seen. IMPRESSION: 1. No acute abnormality in the abdomen/pelvis. 2. Decreased size of previous 6.5 cm left adnexal cyst, currently measuring 4.8 x 4.7 cm, however has increased in density and no longer measures simple fluid. This may represent interval hemorrhage within cyst. Because this lesion is not adequately characterized, prompt [place] is recommended for further evaluation. Note: This recommendation does not apply to premenarchal patients and to those with increased risk (genetic, family history, elevated tumor markers or other high-risk factors) of ovarian cancer. Reference: JACR 2020 Feb; 17(2):248-254 3. Nonobstructing right renal stone. 4. Cholelithiasis without gallbladder inflammation. Aortic Atherosclerosis (ICD10-I70.0). Electronically Signed   By: Keith Rake M.D.   On: 09/24/2020 22:55   X-ray chest PA and lateral  Result Date: 09/24/2020 CLINICAL DATA:  Pneumonia. EXAM: CHEST - 2 VIEW COMPARISON:  Radiograph yesterday. FINDINGS: Left-sided dialysis catheter remains in place. Mild bronchial thickening without focal airspace disease. Normal heart size and mediastinal contours. No pleural fluid or pneumothorax. No acute osseous abnormalities are seen. IMPRESSION: Mild bronchial thickening without focal airspace disease. Electronically Signed   By: Keith Rake M.D.   On: 09/24/2020 22:56   DG Chest Port 1 View  Result Date: 09/23/2020 CLINICAL DATA:  Weakness EXAM: PORTABLE CHEST 1 VIEW COMPARISON:  Prior chest x-ray 08/20/2013 FINDINGS: Left IJ approach hemodialysis catheter. The catheter tip projects over the mid SVC. Stable cardiac and mediastinal contours. No pleural effusion or pulmonary edema. No evidence of airspace opacity or pneumothorax. No acute osseous abnormality.  IMPRESSION: No active disease. Left IJ hemodialysis catheter.  Catheter tip overlies the mid SVC. Electronically Signed   By: Jacqulynn Cadet M.D.   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 No longer needed. Request is for catheter removal. EXAM: REMOVAL TUNNELED CENTRAL VENOUS CATHETER MEDICATIONS: No antibiotic was indicated for this procedure. ANESTHESIA/SEDATION: Moderate (conscious) sedation was not employed during this procedure. FLUOROSCOPY TIME:  Fluoroscopy Time: none COMPLICATIONS: None immediate. PROCEDURE: Informed verbal consent was obtained from the patient after a thorough discussion of the procedural risks, benefits and alternatives. All questions were addressed. Maximal Sterile Barrier Technique was utilized including caps, mask, sterile gowns, sterile gloves, sterile drape, hand hygiene and skin antiseptic. A timeout was performed prior to the initiation of the procedure. The patient's right neck and catheter was prepped and draped in a normal sterile fashion. Heparin was removed from both ports of catheter. The sutures were cut and the catheter was removed in it's entirety. Pressure was held till hemostasis was obtained. A sterile dressing was applied. The patient tolerated the procedure well with no immediate complications. IMPRESSION: Successful non tunneled dialysis catheter removal as described above. Read by: Rushie Nyhan, NP Electronically Signed   By: Sandi Mariscal M.D.   On: 09/26/2020 15:06    Microbiology: Recent Results (from the past 240 hour(s))  SARS CORONAVIRUS 2 (TAT 6-24 HRS) Nasopharyngeal Nasopharyngeal Swab     Status: None   Collection Time: 09/23/20  9:57 AM   Specimen: Nasopharyngeal Swab  Result Value Ref Range Status   SARS Coronavirus 2 NEGATIVE NEGATIVE Final    Comment: (NOTE) SARS-CoV-2 target nucleic acids are NOT  DETECTED.  The SARS-CoV-2 RNA is generally detectable in upper and lower respiratory specimens during the acute phase of infection. Negative results do not preclude SARS-CoV-2 infection, do not rule out co-infections with other pathogens, and should not be  used as the sole basis for treatment or other patient management decisions. Negative results must be combined with clinical observations, patient history, and epidemiological information. The expected result is Negative.  Fact Sheet for Patients: SugarRoll.be  Fact Sheet for Healthcare Providers: https://www.woods-mathews.com/  This test is not yet approved or cleared by the [place] FDA and  has been authorized for detection and/or diagnosis of SARS-CoV-2 by FDA under an Emergency Use Authorization (EUA). This EUA will remain  in effect (meaning this test can be used) for the duration of the COVID-19 declaration under Se ction 564(b)(1) of the Act, 21 U.S.C. section 360bbb-3(b)(1), unless the authorization is terminated or revoked sooner.  Performed at [facility], [place] [address], [place], [place] [address]   Culture, Blood     Status: None (Preliminary result)   Collection Time: 09/24/20  9:49 AM   Specimen: Right Antecubital; Blood  Result Value Ref Range Status   Specimen Description   Final    RIGHT ANTECUBITAL Performed at [facility], [address] [place], [place] [address]    Special Requests   Final    Blood Culture adequate volume Performed at [facility], [address] [place], [place] [address]    Culture   Final    NO GROWTH 3 DAYS Performed at [facility], [place] [address], [place], [place] [address]    Report Status PENDING  Incomplete     Labs: Basic Metabolic Panel: Recent Labs  Lab 09/21/20 1331 09/23/20 0956 09/24/20 0947 09/25/20 0451  NA 140 139 140 141  K 4.2 3.9 3.9 3.9  CL 105 109  106 111  CO2 '28 26 25 24  ' GLUCOSE 97 104* 102* 98  BUN 30* 25* 24* 23  CREATININE 0.93 0.84 0.73 0.73  CALCIUM 8.9 8.7* 8.3* 8.7*   Liver Function Tests: Recent Labs  Lab 09/21/20 1331 09/23/20 0956 09/24/20 0947 09/25/20 0451  AST '22 30 25 30  ' ALT 28 38 37 40  ALKPHOS 37* 37* 41 35*  BILITOT 0.4 0.6 0.5 0.4  PROT 6.1* 5.6* 5.9* 5.4*  ALBUMIN 3.5 3.1* 3.6 3.0*   Recent Labs  Lab 09/23/20 0956  LIPASE 35   CBC: Recent Labs  Lab 09/23/20 0956 09/24/20 0947 09/24/20 1844 09/25/20 0451 09/25/20 1203 09/26/20 0056 09/26/20 0636 09/26/20 1011 09/26/20 1603 09/27/20 0428  WBC 8.0 8.1  --  7.1  --   --   --  6.9 6.5 6.4  NEUTROABS 4.8 5.1  --   --   --   --   --  3.9 3.2 3.1  HGB 9.2* 8.9*   < > 7.6*   < > 8.4* 8.3* 8.3* 7.8* 8.0*  HCT 29.7* 28.7*   < > 24.4*   < > 26.5* 26.1* 26.5* 25.0* 26.2*  MCV 105.3* 102.9*  --  103.8*  --   --   --  100.8* 99.6 100.4*  PLT 233 251  --  204  --   --   --  175 174 180   < > = values in this interval not displayed.    Principal Problem:   Acute blood loss anemia Active Problems:   History of TTP (thrombotic thrombocytopenic purpura)   COPD (chronic obstructive pulmonary disease) (HCC)   Gait instability   Tobacco abuse   TTP (thrombotic thrombocytopenic purpura)   Chronic pain disorder   Adnexal cyst   Aortic atherosclerosis ([place])  Time coordinating discharge: 35 minutes  Signed:  Murray Hodgkins, MD  [facility]  09/27/2020, 10:52 AM

## 2020-09-27 NOTE — Op Note (Signed)
[facility] Patient Name: [name] Procedure Date: 09/27/2020 MRN: [id number] Attending MD: Ronald Lobo , MD Date of Birth: 15-May-1940 CSN: [id number] Age: 80 Admit Type: Inpatient Procedure:                Upper GI endoscopy Indications:              Acute post hemorrhagic anemia with subclinical                            blood loss and anemia with anemic symptoms after                            starting Cablivi for TTP, Heme positive stool Providers:                Ronald Lobo, MD, Nelia Shi, RN, Tyna Jaksch Technician Referring MD:              Medicines:                Monitored Anesthesia Care Complications:            No immediate complications. Estimated Blood Loss:     Estimated blood loss: none. Procedure:                Pre-Anesthesia Assessment:                           - Prior to the procedure, a History and Physical                            was performed, and patient medications and                            allergies were reviewed. The patient's tolerance of                            previous anesthesia was also reviewed. The risks                            and benefits of the procedure and the sedation                            options and risks were discussed with the patient.                            All questions were answered, and informed consent                            was obtained. Prior Anticoagulants: The patient has                            taken no previous anticoagulant or antiplatelet  agents. ASA Grade Assessment: III - A patient with                            severe systemic disease. After reviewing the risks                            and benefits, the patient was deemed in                            satisfactory condition to undergo the procedure.                           After obtaining informed consent, the endoscope was                             passed under direct vision. Throughout the                            procedure, the patient's blood pressure, pulse, and                            oxygen saturations were monitored continuously. The                            [id number]-H190 ([id number]) Olympus gastroscope was                            introduced through the mouth, and advanced to the                            second part of duodenum. The upper GI endoscopy was                            accomplished without difficulty. The patient                            tolerated the procedure well. Scope In: Scope Out: Findings:      A non-obstructing and mild Schatzki ring was found at the       gastroesophageal junction.      A 2 cm hiatal hernia was present.      The exam of the esophagus was otherwise normal.      The entire examined stomach was normal.      The cardia and gastric fundus were normal on retroflexion.      There is no endoscopic evidence of bleeding, erythema or inflammatory       changes suggestive of gastritis, inflammation, mucosal abnormalities,       ulceration, angiodysplasia or erosion in the entire examined stomach.      The examined duodenum was normal. Impression:               - No active bleeding or blood in stomach, nor any                            prospective bleeding site, seen on this exam.                           -  Non-obstructing and mild Schatzki ring.                           - 2 cm hiatal hernia.                           - Normal stomach.                           - Normal examined duodenum.                           - No specimens collected. Moderate Sedation:      This patient was sedated with monitored anesthesia care, not moderate       sedation. Recommendation:           - Observe patient's clinical course. No need for                            ongoing antipeptic therapy. Procedure Code(s):        --- Professional ---                           [phone],  Esophagogastroduodenoscopy, flexible,                            transoral; diagnostic, including collection of                            specimen(s) by brushing or washing, when performed                            (separate procedure) Diagnosis Code(s):        --- Professional ---                           D62, Acute posthemorrhagic anemia                           R19.5, Other fecal abnormalities CPT copyright 2019 American Medical Association. All rights reserved. The codes documented in this report are preliminary and upon coder review may  be revised to meet current compliance requirements. Ronald Lobo, MD 09/27/2020 8:41:11 AM This report has been signed electronically. Number of Addenda: 0

## 2020-09-27 NOTE — Progress Notes (Signed)
Patient's hemoglobin is stable.   Her EGD was well-tolerated, and essentially negative (small hiatal hernia with Schatzki's ring seen).  No prospective source of bleeding was identified.  Impression:    1.  Recent subclinical GI blood loss.  2.  Posthemorrhagic anemia, moderate, stable hemoglobin following transfusion.  3.  No source of bleeding seen in upper GI tract.  Suspect this was a medication side effect from her Cablivi.  Recommendations:  1.  Okay for discharge from GI tract standpoint  2.  No need for ongoing pantoprazole or other antipeptic therapy post discharge  3.  The patient has never had colon cancer screening.  She should discuss with her primary physician and her oncologist whether she wants to have any sort of screening.  Options might include Cologuard, annual Hemoccult testing, virtual colonoscopy, or a standard colonoscopy.  4.  I will sign off.  Please call me if you have questions or if you would like to discuss her case.  Cleotis Nipper, M.D. Pager [phone] If no answer or after 5 PM call [phone]

## 2020-09-27 NOTE — Anesthesia Postprocedure Evaluation (Signed)
Anesthesia Post Note  Patient: [name]  Procedure(s) Performed: ESOPHAGOGASTRODUODENOSCOPY (EGD) WITH PROPOFOL (N/A )     Patient location during evaluation: PACU Anesthesia Type: MAC Level of consciousness: awake and alert Pain management: pain level controlled Vital Signs Assessment: post-procedure vital signs reviewed and stable Respiratory status: spontaneous breathing, nonlabored ventilation and respiratory function stable Cardiovascular status: blood pressure returned to baseline and stable Postop Assessment: no apparent nausea or vomiting Anesthetic complications: no   No complications documented.  Last Vitals:  Vitals:   09/27/20 0746 09/27/20 0832  BP: (!) 133/43 (!) 94/26  Pulse:  88  Resp: 17 20  Temp: 37.2 C 37.1 C  SpO2: 95% 100%    Last Pain:  Vitals:   09/27/20 0832  TempSrc: Oral  PainSc: [name]

## 2020-09-27 NOTE — Anesthesia Preprocedure Evaluation (Addendum)
Anesthesia Evaluation  Patient identified by MRN, date of birth, ID band Patient awake    Reviewed: Allergy & Precautions, NPO status , Patient's Chart, lab work & pertinent test results  Airway Mallampati: III  TM Distance: >3 FB Neck ROM: Full    Dental no notable dental hx. (+) Teeth Intact, Dental Advisory Given   Pulmonary COPD,  COPD inhaler, former smoker,  Uses spiriva, last use yesterday    Pulmonary exam normal breath sounds clear to auscultation       Cardiovascular +CHF (grade 1 diastolic dysfunction)  Normal cardiovascular exam+ Valvular Problems/Murmurs (mild AI) AI  Rhythm:Regular Rate:Normal  Echo 2015: - Left ventricle: The cavity size was normal. Wall thickness  was increased in a pattern of moderate LVH. Systolic  function was normal. The estimated ejection fraction was  in the range of 55% to 60%. Wall motion was normal; there  were no regional wall motion abnormalities. Doppler  parameters are consistent with abnormal left ventricular  relaxation (grade 1 diastolic dysfunction).  - Aortic valve: Mild regurgitation.  - Left atrium: The atrium was mildly dilated.    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   Anesthesia Other Findings   Reproductive/Obstetrics negative OB ROS                            Anesthesia Physical Anesthesia Plan  ASA: III  Anesthesia Plan: MAC   Post-op Pain Management:    Induction:   PONV Risk Score and Plan: 2 and Propofol infusion and  TIVA  Airway Management Planned: Natural Airway and Simple Face Mask  Additional Equipment: None  Intra-op Plan:   Post-operative Plan:   Informed Consent: I have reviewed the patients History and Physical, chart, labs and discussed the procedure including the risks, benefits and alternatives for the proposed anesthesia with the patient or authorized representative who has indicated his/her understanding and acceptance.     Dental advisory given  Plan Discussed with: CRNA  Anesthesia Plan Comments:        Anesthesia Quick Evaluation

## 2020-09-27 NOTE — Transfer of Care (Signed)
Immediate Anesthesia Transfer of Care Note  Patient: [name]  Procedure(s) Performed: ESOPHAGOGASTRODUODENOSCOPY (EGD) WITH PROPOFOL (N/A )  Patient Location: Endoscopy Unit  Anesthesia Type:MAC  Level of Consciousness: awake, alert , oriented and patient cooperative  Airway & Oxygen Therapy: Patient Spontanous Breathing and Patient connected to face mask oxygen  Post-op Assessment: Report given to RN and Post -op Vital signs reviewed and stable  Post vital signs: Reviewed and stable  Last Vitals:  Vitals Value Taken Time  BP 94/26 0830  Temp    Pulse 88 09/27/20 0831  Resp 20 09/27/20 0831  SpO2 100 % 09/27/20 0831  Vitals shown include unvalidated device data.  Last Pain:  Vitals:   09/27/20 0746  TempSrc: Oral  PainSc: 2       Patients Stated Pain Goal: 2 (52/17/47 1595)  Complications: No complications documented.

## 2020-09-27 NOTE — Progress Notes (Signed)
Spoke with Dr. Sarajane Jews on the unit regarding the pt need for a PIV. MD states she will be discharging and currently does not need IV access.

## 2020-09-27 NOTE — Plan of Care (Signed)
Instructions were reviewed with patient. All questions were answered. Patient was transported to main entrance by wheelchair. ° °

## 2020-09-27 NOTE — Interval H&P Note (Signed)
History and Physical Interval Note:  09/27/2020 7:54 AM  [name]  has presented today for surgery, with the diagnosis of anemia, heme positive stool.  The various methods of treatment have been discussed with the patient and family. After consideration of risks, benefits and other options for treatment, the patient has consented to  Procedure(s): ESOPHAGOGASTRODUODENOSCOPY (EGD) WITH PROPOFOL (N/A) as a surgical intervention.  The patient's history has been reviewed, patient examined, no change in status, stable for surgery.  I have reviewed the patient's chart and labs.  Questions were answered to the patient's satisfaction.     [name]

## 2020-09-28 ENCOUNTER — Encounter (HOSPITAL_COMMUNITY): Payer: Self-pay | Admitting: Gastroenterology

## 2020-09-28 LAB — TYPE AND SCREEN
ABO/RH(D): O POS
Antibody Screen: NEGATIVE
Unit division: 0

## 2020-09-28 LAB — BPAM RBC
Blood Product Expiration Date: 202206282359
ISSUE DATE / TIME: 202205271405
Unit Type and Rh: 5100

## 2020-09-29 ENCOUNTER — Other Ambulatory Visit: Payer: Self-pay | Admitting: Nurse Practitioner

## 2020-09-29 ENCOUNTER — Telehealth: Payer: Self-pay | Admitting: *Deleted

## 2020-09-29 DIAGNOSIS — M3119 Other thrombotic microangiopathy: Secondary | ICD-10-CM

## 2020-09-29 LAB — CULTURE, BLOOD (SINGLE)
Culture: NO GROWTH
Special Requests: ADEQUATE

## 2020-09-29 NOTE — Telephone Encounter (Signed)
Called the patient's daughter to schedule the new patient referral appt. Patient's daughter unaware of referral and would like to discuss with Dr Benay Spice and [name] APP tomorrow; ask that the office call back at the end of the week

## 2020-09-30 ENCOUNTER — Other Ambulatory Visit: Payer: Self-pay | Admitting: Nurse Practitioner

## 2020-09-30 ENCOUNTER — Other Ambulatory Visit: Payer: Self-pay

## 2020-09-30 ENCOUNTER — Encounter: Payer: Self-pay | Admitting: *Deleted

## 2020-09-30 ENCOUNTER — Inpatient Hospital Stay: Payer: Medicare Other | Attending: Oncology | Admitting: Nurse Practitioner

## 2020-09-30 ENCOUNTER — Inpatient Hospital Stay: Payer: Medicare Other

## 2020-09-30 VITALS — BP 117/56 | HR 97 | Temp 97.6°F | Resp 18

## 2020-09-30 DIAGNOSIS — Z882 Allergy status to sulfonamides status: Secondary | ICD-10-CM | POA: Diagnosis not present

## 2020-09-30 DIAGNOSIS — M3119 Other thrombotic microangiopathy: Secondary | ICD-10-CM

## 2020-09-30 DIAGNOSIS — D649 Anemia, unspecified: Secondary | ICD-10-CM | POA: Insufficient documentation

## 2020-09-30 DIAGNOSIS — N83202 Unspecified ovarian cyst, left side: Secondary | ICD-10-CM | POA: Insufficient documentation

## 2020-09-30 DIAGNOSIS — Z806 Family history of leukemia: Secondary | ICD-10-CM | POA: Insufficient documentation

## 2020-09-30 DIAGNOSIS — Z8249 Family history of ischemic heart disease and other diseases of the circulatory system: Secondary | ICD-10-CM | POA: Diagnosis not present

## 2020-09-30 DIAGNOSIS — Z888 Allergy status to other drugs, medicaments and biological substances status: Secondary | ICD-10-CM | POA: Diagnosis not present

## 2020-09-30 DIAGNOSIS — Z803 Family history of malignant neoplasm of breast: Secondary | ICD-10-CM | POA: Insufficient documentation

## 2020-09-30 DIAGNOSIS — Z862 Personal history of diseases of the blood and blood-forming organs and certain disorders involving the immune mechanism: Secondary | ICD-10-CM | POA: Diagnosis not present

## 2020-09-30 DIAGNOSIS — Z79899 Other long term (current) drug therapy: Secondary | ICD-10-CM | POA: Diagnosis not present

## 2020-09-30 DIAGNOSIS — J449 Chronic obstructive pulmonary disease, unspecified: Secondary | ICD-10-CM | POA: Insufficient documentation

## 2020-09-30 DIAGNOSIS — N393 Stress incontinence (female) (male): Secondary | ICD-10-CM | POA: Insufficient documentation

## 2020-09-30 DIAGNOSIS — M549 Dorsalgia, unspecified: Secondary | ICD-10-CM | POA: Insufficient documentation

## 2020-09-30 DIAGNOSIS — G8929 Other chronic pain: Secondary | ICD-10-CM | POA: Diagnosis not present

## 2020-09-30 DIAGNOSIS — F17210 Nicotine dependence, cigarettes, uncomplicated: Secondary | ICD-10-CM | POA: Insufficient documentation

## 2020-09-30 LAB — CBC WITH DIFFERENTIAL (CANCER CENTER ONLY)
Abs Immature Granulocytes: 0.05 10*3/uL (ref 0.00–0.07)
Basophils Absolute: 0.1 10*3/uL (ref 0.0–0.1)
Basophils Relative: 1 %
Eosinophils Absolute: 0.1 10*3/uL (ref 0.0–0.5)
Eosinophils Relative: 1 %
HCT: 30.9 % — ABNORMAL LOW (ref 36.0–46.0)
Hemoglobin: 9.5 g/dL — ABNORMAL LOW (ref 12.0–15.0)
Immature Granulocytes: 1 %
Lymphocytes Relative: 33 %
Lymphs Abs: 2.7 10*3/uL (ref 0.7–4.0)
MCH: 30.4 pg (ref 26.0–34.0)
MCHC: 30.7 g/dL (ref 30.0–36.0)
MCV: 98.7 fL (ref 80.0–100.0)
Monocytes Absolute: 0.9 10*3/uL (ref 0.1–1.0)
Monocytes Relative: 10 %
Neutro Abs: 4.6 10*3/uL (ref 1.7–7.7)
Neutrophils Relative %: 54 %
Platelet Count: 225 10*3/uL (ref 150–400)
RBC: 3.13 MIL/uL — ABNORMAL LOW (ref 3.87–5.11)
RDW: 16.3 % — ABNORMAL HIGH (ref 11.5–15.5)
WBC Count: 8.3 10*3/uL (ref 4.0–10.5)
nRBC: 0.2 % (ref 0.0–0.2)

## 2020-09-30 LAB — CMP (CANCER CENTER ONLY)
ALT: 21 U/L (ref 0–44)
AST: 20 U/L (ref 15–41)
Albumin: 3.4 g/dL — ABNORMAL LOW (ref 3.5–5.0)
Alkaline Phosphatase: 46 U/L (ref 38–126)
Anion gap: 7 (ref 5–15)
BUN: 19 mg/dL (ref 8–23)
CO2: 27 mmol/L (ref 22–32)
Calcium: 8.8 mg/dL — ABNORMAL LOW (ref 8.9–10.3)
Chloride: 107 mmol/L (ref 98–111)
Creatinine: 0.77 mg/dL (ref 0.44–1.00)
GFR, Estimated: >60 mL/min (ref >60)
Glucose, Bld: 84 mg/dL (ref 70–99)
Potassium: 4.6 mmol/L (ref 3.5–5.1)
Sodium: 141 mmol/L (ref 135–145)
Total Bilirubin: 0.4 mg/dL (ref 0.3–1.2)
Total Protein: 5.6 g/dL — ABNORMAL LOW (ref 6.5–8.1)

## 2020-09-30 LAB — RETIC PANEL
Immature Retic Fract: 35 % — ABNORMAL HIGH (ref 2.3–15.9)
RBC.: 3.11 MIL/uL — ABNORMAL LOW (ref 3.87–5.11)
Retic Count, Absolute: 192 10*3/uL — ABNORMAL HIGH (ref 19.0–186.0)
Retic Ct Pct: 6.3 % — ABNORMAL HIGH (ref 0.4–3.1)
Reticulocyte Hemoglobin: 24.5 pg — ABNORMAL LOW (ref >27.9)

## 2020-09-30 LAB — SAMPLE TO BLOOD BANK

## 2020-09-30 LAB — SAVE SMEAR(SSMR), FOR PROVIDER SLIDE REVIEW

## 2020-09-30 LAB — LACTATE DEHYDROGENASE: LDH: 284 U/L — ABNORMAL HIGH (ref 98–192)

## 2020-09-30 NOTE — Progress Notes (Addendum)
[facility] OFFICE PROGRESS NOTE   Diagnosis: TTP  INTERVAL HISTORY:   [name] returns as scheduled.  She is feeling better.  Stools are lighter in color.  Appetite has improved.  No headaches.  Vision is better.  Objective:  Vital signs in last 24 hours:  Blood pressure (!) 117/56, pulse 97, temperature 97.6 F (36.4 C), temperature source Temporal, resp. rate 18, SpO2 97 %.    HEENT: No thrush or ulcers.  No bleeding within the oral cavity. Resp: Distant breath sounds, lungs are clear.  No respiratory distress. Cardio: Regular rate and rhythm. GI: No hepatosplenomegaly.  Abdomen nontender. Vascular: Trace edema lower leg bilaterally. Neuro: Alert and oriented. Skin: Resolving ecchymosis right antecubital fossa.   Lab Results:  Lab Results  Component Value Date   WBC 8.3 09/30/2020   HGB 9.5 (L) 09/30/2020   HCT 30.9 (L) 09/30/2020   MCV 98.7 09/30/2020   PLT 225 09/30/2020   NEUTROABS 4.6 09/30/2020   Blood smear: The platelets appear normal in number.  The white cell morphology is unremarkable.  The polychromasia is mildly increased.  Few ovalocytes and teardrops.  No schistocytes.  Few spherocytes. Imaging:  No results found.  Medications: I have reviewed the patient's current medications.  Assessment/Plan: 1. TTP diagnosed in 2005, treated with plasma exchange, steroids, and rituximab consolidation ? 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 Relapse of TTP March 2015-plasma exchange, steroids, rituximab, and maintenance azathioprine ? Admission with severe thrombocytopenia and elevated LDH 07/16/2020, ADAMTS13 activity less than 2%,consistent with relapse of TTP -Steroids/FFP given 07/16/2020 -Daily plasma exchange beginning 07/17/2020, 7 days, then qod starting 3/24, discharge 07/21/2020 on prednisone 60 mg daily -Prednisone taper to 40 mg daily 07/29/2020 -rituximab 07/30/2020, 08/06/2020,  08/13/2020, 08/20/2020 -plasmapheresis on 07/31/2020, 08/03/2020, 08/05/2020, and 08/07/2020 -prednisone increased to 60 mg daily 08/17/2020 -Plasmapheresis 08/24/2020,08/25/2020, 08/26/2020, 08/27/2020, 08/28/2020,08/29/2020, 08/31/2020, 09/02/2020, 09/04/2020 -caplacizumabinitiated 08/24/2020 -Decrease prednisone to 40 mg daily 08/28/2020 -ADAMTS13 37% on 09/04/2020 -Prednisone decreased to 30 mg daily 09/07/2020 -Continue caplacizumab -prednisone decreased to 20 mg daily beginning 09/18/2020  -Cablivi discontinued 09/21/2020   -prednisone discontinued 09/24/2020 2. History of epidural abscess requiring laminectomy 2012 3. 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 COPD 6. Anemia, progressive-stool positive for blood 7. Hospital admission 09/24/2020- symptomatic anemia, heme positive stools; transfused 1 unit of blood 09/25/2020; EGD 09/27/2020 by Dr. Cristina Gong- no active bleeding or blood in the stomach, nor any prospective bleeding site, seen on exam.   Disposition: [name] appears improved.  She was hospitalized last week with progressive anemia, heme positive stool.  Cablivi discontinued.  No source of blood loss on EGD.  She is off of prednisone.  Pheresis catheter has been removed.  Markers of hemolysis continue to be improved.  Plan to continue to follow with observation.  She will return for a CBC in 1 week in 2 weeks.  We will see her in follow-up in 3 weeks.  She will contact the office in the interim with any problems.  Patient seen with Dr. Benay Spice.    [name] ANP/GNP-BC   09/30/2020  3:27 PM  This was a shared visit with [name].  [name] appears well.  The hemoglobin is higher compared to when she left the hospital.  The platelet count remains normal.  The LDH is elevated and she is mounting a reticulocytosis.  I reviewed the peripheral blood smear today.  She remains at risk for relapse of TTP.  We will follow the CBC closely over the next few weeks.  We will refer her for second opinion if she has progressive anemia or thrombocytopenia.  She will remain off of Cablivi.  I was present for greater than 50% of today's visit.  I performed medical decision making.  Julieanne Manson, MD

## 2020-10-01 ENCOUNTER — Other Ambulatory Visit: Payer: Self-pay | Admitting: Nurse Practitioner

## 2020-10-01 ENCOUNTER — Encounter: Payer: Self-pay | Admitting: *Deleted

## 2020-10-01 ENCOUNTER — Encounter: Payer: Self-pay | Admitting: Oncology

## 2020-10-01 ENCOUNTER — Telehealth: Payer: Self-pay | Admitting: *Deleted

## 2020-10-01 DIAGNOSIS — M3119 Other thrombotic microangiopathy: Secondary | ICD-10-CM

## 2020-10-01 NOTE — Progress Notes (Signed)
CRITICAL VALUE STICKER  CRITICAL VALUE: ADAMTS13 Activity 11.5  RECEIVER (on-site recipient of call): Tonya C.   DATE & TIME NOTIFIED: 10/01/20 @ [place] (representative from lab): Silva Bandy  MD NOTIFIED: Dr. Benay Spice  TIME OF NOTIFICATION: 0806  RESPONSE: Resume Prednisone at 20 mg daily and refer to [facility] hematology service.

## 2020-10-01 NOTE — Progress Notes (Signed)
I contacted [name]'s daughter [name] this morning.  Dr. Benay Spice recommends resuming prednisone 20 mg daily.  We are making a referral to the hematology service at [facility].  She is on Seroquel which has been associated with TTP.  [name] reports she has been on Seroquel for sleep for probably 7+ years.  Dr. Benay Spice would like her to follow-up with the prescriber and taper off if possible.  Referral made to GYN oncology to evaluate left adnexal cyst.  She understands the above.

## 2020-10-01 NOTE — Progress Notes (Signed)
Faxed referral order, demographics, last office note, med list and recent labs including ADAMST13 activity result of 11.3 to [facility] [phone]. Noted to call daughter with appointment.

## 2020-10-01 NOTE — Telephone Encounter (Signed)
Called the patient's daughter and she is unaware of the referral. Explained that I would have Dr Gearldine Shown reach out to her.

## 2020-10-02 ENCOUNTER — Telehealth: Payer: Self-pay | Admitting: Hematology and Oncology

## 2020-10-02 ENCOUNTER — Encounter: Payer: Self-pay | Admitting: Hematology and Oncology

## 2020-10-02 ENCOUNTER — Encounter: Payer: Self-pay | Admitting: Oncology

## 2020-10-02 LAB — ADAMTS13 ACTIVITY
Adamts 13 Activity: 11.3 % — CL (ref >66.8)
Adamts 13 Activity: 10.4 % (ref >66.8)

## 2020-10-02 LAB — ADAMTS13 ANTIBODY
ADAMTS13 Antibody: 3 Units/mL (ref <12)
ADAMTS13 Antibody: 3 Units/mL (ref <12)

## 2020-10-02 NOTE — Telephone Encounter (Signed)
I called patient at all available numbers and left a voicemail to call us back. Her ADAMTS 13 activity is very low, however this doesn't appear unusual for her. Hb is better, T bili is normal, so no concern for major hemolysisi LDH mildly elevated. It looks like this is a chronic problem with intermittent worsening. I will try to reach her again in AM.  Marcellus Pulliam

## 2020-10-02 NOTE — Telephone Encounter (Signed)
Thanks, she has an ap[pt on 6/8 with Dr Berline Lopes. I moved her lab appt to after her appt with [place] here.   Sharyn Lull

## 2020-10-02 NOTE — Telephone Encounter (Signed)
Called the patient's daughter and scheduled a new patient appt for 7/8 with lab to follow

## 2020-10-03 ENCOUNTER — Encounter: Payer: Self-pay | Admitting: Oncology

## 2020-10-05 ENCOUNTER — Encounter: Payer: Self-pay | Admitting: *Deleted

## 2020-10-05 NOTE — Progress Notes (Signed)
Faxed 09/30/20 results of ADAMTS13 Antibody and Activity to [facility] at [phone]. Spoke w/triage RN, Amy and she reports that Dr. Florene Glen is not seeing TTP and they are working on getting her in to see Dr. Hart Robinsons or Dr. Griffin Basil.

## 2020-10-06 ENCOUNTER — Encounter: Payer: Self-pay | Admitting: Gynecologic Oncology

## 2020-10-07 ENCOUNTER — Other Ambulatory Visit: Payer: Medicare Other

## 2020-10-07 ENCOUNTER — Inpatient Hospital Stay: Payer: Medicare Other

## 2020-10-07 ENCOUNTER — Other Ambulatory Visit: Payer: Self-pay

## 2020-10-07 ENCOUNTER — Telehealth: Payer: Self-pay | Admitting: Nurse Practitioner

## 2020-10-07 ENCOUNTER — Inpatient Hospital Stay (HOSPITAL_BASED_OUTPATIENT_CLINIC_OR_DEPARTMENT_OTHER): Payer: Medicare Other | Admitting: Gynecologic Oncology

## 2020-10-07 ENCOUNTER — Encounter: Payer: Self-pay | Admitting: Gynecologic Oncology

## 2020-10-07 VITALS — BP 126/55 | HR 99 | Temp 97.5°F | Resp 18 | Ht 64.0 in | Wt 159.4 lb

## 2020-10-07 DIAGNOSIS — N83202 Unspecified ovarian cyst, left side: Secondary | ICD-10-CM

## 2020-10-07 DIAGNOSIS — Z862 Personal history of diseases of the blood and blood-forming organs and certain disorders involving the immune mechanism: Secondary | ICD-10-CM

## 2020-10-07 DIAGNOSIS — M3119 Other thrombotic microangiopathy: Secondary | ICD-10-CM | POA: Diagnosis not present

## 2020-10-07 DIAGNOSIS — N949 Unspecified condition associated with female genital organs and menstrual cycle: Secondary | ICD-10-CM

## 2020-10-07 LAB — CBC WITH DIFFERENTIAL (CANCER CENTER ONLY)
Abs Immature Granulocytes: 0.06 10*3/uL (ref 0.00–0.07)
Basophils Absolute: 0.1 10*3/uL (ref 0.0–0.1)
Basophils Relative: 1 %
Eosinophils Absolute: 0.1 10*3/uL (ref 0.0–0.5)
Eosinophils Relative: 1 %
HCT: 34.1 % — ABNORMAL LOW (ref 36.0–46.0)
Hemoglobin: 10.7 g/dL — ABNORMAL LOW (ref 12.0–15.0)
Immature Granulocytes: 1 %
Lymphocytes Relative: 30 %
Lymphs Abs: 2.4 10*3/uL (ref 0.7–4.0)
MCH: 30.2 pg (ref 26.0–34.0)
MCHC: 31.4 g/dL (ref 30.0–36.0)
MCV: 96.3 fL (ref 80.0–100.0)
Monocytes Absolute: 0.8 10*3/uL (ref 0.1–1.0)
Monocytes Relative: 10 %
Neutro Abs: 4.7 10*3/uL (ref 1.7–7.7)
Neutrophils Relative %: 57 %
Platelet Count: 298 10*3/uL (ref 150–400)
RBC: 3.54 MIL/uL — ABNORMAL LOW (ref 3.87–5.11)
RDW: 16 % — ABNORMAL HIGH (ref 11.5–15.5)
WBC Count: 8.1 10*3/uL (ref 4.0–10.5)
nRBC: 0 % (ref 0.0–0.2)

## 2020-10-07 LAB — CMP (CANCER CENTER ONLY)
ALT: 17 U/L (ref 0–44)
AST: 17 U/L (ref 15–41)
Albumin: 3.3 g/dL — ABNORMAL LOW (ref 3.5–5.0)
Alkaline Phosphatase: 55 U/L (ref 38–126)
Anion gap: 10 (ref 5–15)
BUN: 20 mg/dL (ref 8–23)
CO2: 28 mmol/L (ref 22–32)
Calcium: 9.5 mg/dL (ref 8.9–10.3)
Chloride: 106 mmol/L (ref 98–111)
Creatinine: 1.05 mg/dL — ABNORMAL HIGH (ref 0.44–1.00)
GFR, Estimated: 54 mL/min — ABNORMAL LOW (ref >60)
Glucose, Bld: 96 mg/dL (ref 70–99)
Potassium: 4.3 mmol/L (ref 3.5–5.1)
Sodium: 144 mmol/L (ref 135–145)
Total Bilirubin: 0.3 mg/dL (ref 0.3–1.2)
Total Protein: 6.1 g/dL — ABNORMAL LOW (ref 6.5–8.1)

## 2020-10-07 LAB — RETIC PANEL
Immature Retic Fract: 33.3 % — ABNORMAL HIGH (ref 2.3–15.9)
RBC.: 3.54 MIL/uL — ABNORMAL LOW (ref 3.87–5.11)
Retic Count, Absolute: 224.4 10*3/uL — ABNORMAL HIGH (ref 19.0–186.0)
Retic Ct Pct: 6.3 % — ABNORMAL HIGH (ref 0.4–3.1)
Reticulocyte Hemoglobin: 30.9 pg (ref >27.9)

## 2020-10-07 LAB — LACTATE DEHYDROGENASE: LDH: 268 U/L — ABNORMAL HIGH (ref 98–192)

## 2020-10-07 NOTE — Patient Instructions (Signed)
It was a pleasure meeting you today.  I will call you once I have reviewed her ultrasound images and have the results back.  As we discussed, my concern regarding your cyst on the ovary is very low.  Compared to your CT scan back in 2018, the cyst has decreased in size.  CT scan is not a very good way to look at the female organs.  It very well may be that you had some bleeding into the cyst; however, given its small size, it is unlikely that you blood much into the ovary.  The ultrasound will let us assure that there has been no significant change in the size of this cyst and assess whether there are any findings that are concerning.

## 2020-10-07 NOTE — Progress Notes (Signed)
GYNECOLOGIC ONCOLOGY NEW PATIENT CONSULTATION   Patient Name: [name]  Patient Age: 80 y.o. Date of Service: 10/07/20 Referring Provider: [name] NP  Primary Care Provider: Kirk Ruths, MD Consulting Provider: Jeral Pinch, MD   Assessment/Plan:  Postmenopausal patient with at least a 4-year history of an adnexal mass in the setting of significant comorbidities.  I reviewed most recent CT findings with the patient as well as her daughter.  Her adnexal mass was present on imaging that is visible in our system in 2018.  The adnexal cyst is slightly decreased in size on recent imaging compared to 2018.  We discussed that the concern for a borderline or malignant condition is very low given no other intra-abdominal and pelvic findings as well as stable to decreased size of this mass.  There was some question about whether there was potentially hemorrhage into the cyst given change in density on CT scan.  I discussed the limitations of CT and looking at gynecologic organs.  Patient has an MRI that is outside of the [facility] system in mid-2021.  This was to look at her lumbar spine.  There is some documentation and notes in Care Everywhere that her adnexal mass was seen on MRI and recommendation made by the radiologist at that point to follow-up with pelvic ultrasound.  I am unable to see the MRI images and never got a pelvic ultrasound.  Given recent anemia requiring hospitalization, I recommend pelvic ultrasound to reassess her adnexal mass.  It is possible that she had some bleeding into the mass although given its size, it is unlikely that she had much hemorrhage in this location.  Ultrasound will allow [place] to see if there has been significant change in the size of her adnexal lesion and to assure that there are no concerning features/characteristics.  If, as I suspect, it is similar in size to her recent CT scan and there are no concerning features, I do not think that she needs  continued imaging surveillance.  These recommendations were discussed with both the patient and her daughter who were in agreement.  I will call the patient once I have reviewed her ultrasound images.  A copy of this note was sent to the patient's referring provider.   60 minutes of total time was spent for this patient encounter, including preparation, face-to-face counseling with the patient and coordination of care, and documentation of the encounter.  Jeral Pinch, MD  Division of Gynecologic Oncology  Department of Obstetrics and Gynecology  [facility]  ___________________________________________  Chief Complaint: Chief Complaint  Patient presents with  . Adnexal cyst    History of Present Illness:  [name] is a 80 y.o. y.o. female who is seen in consultation at the request of [name] for an evaluation of an adnexal mass.  The patient presents today with her daughter.  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  She was recently admitted in the setting of acute blood loss anemia.  She was admitted for management including GI evaluation for possible GI bleed.  Endoscopy was unremarkable.  Ultimately, it was felt to be that the caplacizumab had caused her anemia.  Since her discharge, she reports overall doing well.  She has had some issues with constipation for the last several weeks.  She is taking stool softeners without much improvement in her bowel function.  She notes overall some fluctuation in her appetite.  She has had some intermittent nausea  since she began having recent downtrending of her hemoglobin.  She denies any emesis.  She has occasional stress incontinence, otherwise denies urinary symptoms.  The patient has had an adnexal mass noted back in imaging in 2018.  More recently, within care everywhere, I can see notes that the mass was again seen on MRI last summer.  At that time, pelvic ultrasound was recommended as  well as referral to GYN.  Unfortunately, at the time of her appointment with GYN, the patient had family members who tested positive for COVID and the appointment was canceled.  She has been menopausal since the age of 29 or so.  She has a remote history of heavy menses that would cause significant anemia.  Since menopause, she denies any vaginal discharge or vaginal bleeding.  She denies any pelvic or abdominal pain.  Patient lives in [place] with her husband as well as her daughter, daughter son and daughter's husband.  She continues to use tobacco, smoking at least 2 packs a day.  PAST MEDICAL HISTORY:  Past Medical History:  Diagnosis Date  . Abnormal laboratory test 09/10/2013   Persistent elevation LDH  . Acute delirium   . Adnexal cyst 09/25/2020  . Anemia   . Arthritis   . Bacteremia 03/2011  . Blood dyscrasia   . Blood transfusion   . Bronchitis, chronic obstructive w acute bronchitis ([place]) 08/12/2013  . Chronic back pain   . COPD (chronic obstructive pulmonary disease) ([place])   . Depression    "mild"  . 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 History of TTP (thrombotic thrombocytopenic purpura)   . Hypokalemia 08/08/2013   Steroid related  . Normal echocardiogram 03/15/11  . Oral thrush 08/08/2013  . Septicemia 03/2011  . Spinal stenosis, lumbar   . Tobacco abuse 05/10/2013     PAST SURGICAL HISTORY:  Past Surgical History:  Procedure Laterality Date  . CATARACT EXTRACTION W/ INTRAOCULAR LENS  IMPLANT, BILATERAL  ~ 2010  . CESAREAN SECTION  10/09/1976  . ESOPHAGOGASTRODUODENOSCOPY (EGD) WITH PROPOFOL N/A 09/27/2020   Procedure: ESOPHAGOGASTRODUODENOSCOPY (EGD) WITH PROPOFOL;  Surgeon: Ronald Lobo, MD;  Location: [facility] ENDOSCOPY;  Service: Endoscopy;  Laterality: N/A;  . EYE SURGERY    . INSERTION OF DIALYSIS CATHETER Left 07/12/2013   Procedure: INSERTION OF DIALYSIS CATHETER   with Ultrasound;  Surgeon: Rosetta Posner, MD;  Location[facility] OR;   Service: Vascular;  Laterality: Left;  . IR PERC TUN PERIT CATH WO PORT S&I Dartha Lodge  07/16/2020  . IR RADIOLOGIST EVAL & MGMT  09/26/2020  . IR US GUIDE VASC ACCESS LEFT  07/16/2020  . LUMBAR LAMINECTOMY/DECOMPRESSION MICRODISCECTOMY  03/16/2011   Procedure: LUMBAR LAMINECTOMY/DECOMPRESSION MICRODISCECTOMY;  Surgeon: Elaina Hoops;  Location: [place] [facility]URO ORS;  Service: Neurosurgery;  Laterality: N/A;  . TEE WITHOUT CARDIOVERSION  03/21/2011   Procedure: TRANSESOPHAGEAL ECHOCARDIOGRAM (TEE);  Surgeon: Laverda Page;  Location[facility] ENDOSCOPY;  Service: Cardiovascular;  Laterality: N/A;  TEE for vegetations  . TONSILLECTOMY    . TUBAL LIGATION      OB/GYN HISTORY:  OB History  Gravida Para Term Preterm AB Living  1 1          SAB IAB Ectopic Multiple Live Births               # Outcome Date GA Lbr Len/2nd Weight Sex Delivery Anes PTL Lv  1 Para  No LMP recorded. Patient is postmenopausal.  Age at menarche: 59 Age at menopause: 72 Hx of HRT: Denies Hx of STDs: Denies Last pap: Approximately 10 years ago History of abnormal pap smears: Denies  SCREENING STUDIES:  Last mammogram: 2016  Last colonoscopy: n/a, had endoscopy recently   MEDICATIONS: Outpatient Encounter Medications as of 10/07/2020  Medication Sig  . ascorbic acid (VITAMIN C) 500 MG tablet Take 500 mg by mouth daily.  . cholecalciferol (VITAMIN D) 400 UNITS TABS Take 400 Units by mouth daily.  . cyanocobalamin 1000 MCG tablet Take 1,000 mcg by mouth daily.  Marland Kitchen FLUoxetine (PROZAC) 20 MG capsule Take 20 mg by mouth daily.  . folic acid (FOLVITE) 1 MG tablet Take 1 mg by mouth daily.  Marland Kitchen gabapentin (NEURONTIN) 600 MG tablet Take 600 mg by mouth 3 (three) times daily.  . Multiple Vitamin (MULTIVITAMIN) tablet Take 1 tablet by mouth daily.   . polyethylene glycol (MIRALAX / GLYCOLAX) packet Take 17 g by mouth daily. (Patient taking differently: Take 17 g by mouth daily as needed for mild constipation (MIX AND DRINK).)   . predniSONE (DELTASONE) 20 MG tablet Take 20 mg by mouth daily with breakfast.  . QUEtiapine (SEROQUEL) 25 MG tablet Take 25 mg by mouth at bedtime.  . rosuvastatin (CRESTOR) 20 MG tablet Take 20 mg by mouth at bedtime.  Marland Kitchen tiotropium (SPIRIVA) 18 MCG inhalation capsule Place 18 mcg into inhaler and inhale daily.  . traMADol (ULTRAM) 50 MG tablet Take 1 tablet (50 mg total) by mouth 2 (two) times daily.  . Nicotine (NICODERM CQ TD) Place 1 patch onto the skin See admin instructions. Apply 1 patch onto the skin daily- remove as directed- ONLY WHEN HOSPITALIZED (Patient not taking: Reported on 10/06/2020)   No facility-administered encounter medications on file as of 10/07/2020.    ALLERGIES:  Allergies  Allergen Reactions  . Adhesive [Tape] Other (See Comments)    Blisters, Band-Aids brand rips off the skin- paper tape only!!  . Cefuroxime Axetil Anxiety and Other (See Comments)    Made the patient jittery  . Other Hives and Other (See Comments)    Wool: Reaction is hives [employer] tape: Reaction is blistering   . Sulfa Antibiotics Hives  . Sulfa Drugs Cross Reactors Hives     FAMILY HISTORY:  Family History  Problem Relation Age of Onset  . Heart disease Mother   . Leukemia Sister   . Breast cancer Neg Hx   . Colon cancer Neg Hx   . Ovarian cancer Neg Hx   . Uterine cancer Neg Hx   . Cervical cancer Neg Hx      SOCIAL HISTORY:  Social Connections: Not on file    REVIEW OF SYSTEMS:  + Appetite changes, fatigue, vision problems, leg swelling, constipation, bruising/bleeding easily Denies fevers, chills, unexplained weight changes. Denies hearing loss, neck lumps or masses, mouth sores, ringing in ears or voice changes. Denies cough or wheezing.  Denies shortness of breath. Denies chest pain or palpitations. Denies abdominal distention, pain, blood in stools, diarrhea, nausea, vomiting, or early satiety. Denies pain with intercourse, dysuria, frequency,  hematuria or incontinence. Denies hot flashes, pelvic pain, vaginal bleeding or vaginal discharge.   Denies joint pain, back pain or muscle pain/cramps. Denies itching, rash, or wounds. Denies dizziness, headaches, numbness or seizures. Denies swollen lymph nodes or glands. Denies anxiety, depression, confusion, or decreased concentration.  Physical Exam:  Vital Signs for this encounter:  Blood pressure Marland Kitchen)  126/55, pulse 99, temperature (!) 97.5 F (36.4 C), temperature source Tympanic, resp. rate 18, height 5\' 4"  (1.626 m), weight 159 lb 6.4 oz (72.3 kg), SpO2 97 %. Body mass index is 27.36 kg/m. General: Alert, oriented, no acute distress.  HEENT: Normocephalic, atraumatic. Sclera anicteric.  Chest: Mildly decreased breath sounds at lung bases, otherwise clear to auscultation bilaterally. No wheezes, rhonchi. Cardiovascular: Regular rate and rhythm, no murmurs, rubs, or gallops.  Abdomen: Normoactive bowel sounds. Soft, nondistended, nontender to palpation. No masses or hepatosplenomegaly appreciated. No palpable fluid wave.  Extremities: Grossly normal range of motion. Warm, well perfused.  Trace edema bilaterally.  Skin: No rashes or lesions.  Lymphatics: No cervical, supraclavicular, or inguinal adenopathy.  GU:  Normal external female genitalia.  No lesions. No discharge or bleeding.             Bladder/urethra:  No lesions or masses, well supported bladder             Vagina: Moderately atrophic, no masses or lesions.             Cervix: Normal appearing, no lesions.  Atrophic.             Uterus: Small, mobile, no parametrial involvement or nodularity.             Adnexa: Mobile fullness in the cul-de-sac along the midline, no nodularity.  Rectal: Confirms these findings.  LABORATORY AND RADIOLOGIC DATA:  Outside medical records were reviewed to synthesize the above history, along with the history and physical obtained during the visit.   Lab Results  Component Value Date    WBC 8.1 10/07/2020   HGB 10.7 (L) 10/07/2020   HCT 34.1 (L) 10/07/2020   PLT 298 10/07/2020   GLUCOSE 96 10/07/2020   CHOL 147 09/14/2012   TRIG 471 (H) 09/14/2012   HDL 31 (L) 09/14/2012   LDLCALC SEE COMMENT 09/14/2012   ALT 17 10/07/2020   AST 17 10/07/2020   NA 144 10/07/2020   K 4.3 10/07/2020   CL 106 10/07/2020   CREATININE 1.05 (H) 10/07/2020   BUN 20 10/07/2020   CO2 28 10/07/2020   INR 1.1 07/16/2020   HGBA1C 5.8 (H) 08/20/2013   CT A/P on 09/24/20: 1. No acute abnormality in the abdomen/pelvis. 2. Decreased size of previous 6.5 cm left adnexal cyst, currently measuring 4.8 x 4.7 cm, however has increased in density and no longer measures simple fluid. This may represent interval hemorrhage within cyst. Because this lesion is not adequately characterized, prompt [place] is recommended for further evaluation. Note: This recommendation does not apply to premenarchal patients and to those with increased risk (genetic, family history, elevated tumor markers or other high-risk factors) of ovarian cancer. Reference: JACR 2020 Feb; 17(2):248-254 3. Nonobstructing right renal stone. 4. Cholelithiasis without gallbladder inflammation.  CT A/P on 04/08/17: 1. 2 mm nonobstructing LEFT upper pole nephrolithiasis. 2. Moderate amount of retained large bowel stool without bowel obstruction or acute intra-abdominal/pelvic process. 3. **An incidental finding of potential clinical significance has been found. 6.5 cm LEFT adnexal cyst. Recommend pelvic ultrasound. This recommendation follows ACR consensus guidelines: White Paper of the ACR Incidental Findings Committee II on Adnexal Findings. J Am Coll Radiol [phone].

## 2020-10-07 NOTE — Telephone Encounter (Signed)
I reviewed today's lab results with Ms. Ditullio's daughter.  Plan follow-up as scheduled.

## 2020-10-13 ENCOUNTER — Inpatient Hospital Stay: Payer: Medicare Other

## 2020-10-13 ENCOUNTER — Ambulatory Visit (HOSPITAL_COMMUNITY): Payer: Medicare Other | Attending: Gynecologic Oncology

## 2020-10-14 ENCOUNTER — Other Ambulatory Visit: Payer: Medicare Other

## 2020-10-14 ENCOUNTER — Telehealth: Payer: Self-pay

## 2020-10-14 NOTE — Telephone Encounter (Signed)
LM for [name] requesting that she call back to Dr. Charisse March office to discuss rescheduling the lab work and [place] scheduled for today.

## 2020-10-16 ENCOUNTER — Encounter: Payer: Self-pay | Admitting: Oncology

## 2020-10-16 NOTE — Telephone Encounter (Signed)
LM for Ms Hillman and husband that the [place] was r/s to 10-23-20 at1:30 at [place] to admitting to check in at 1:15. If they have any questions or concerns regarding the appointment, call the office at [phone]

## 2020-10-23 ENCOUNTER — Inpatient Hospital Stay (HOSPITAL_BASED_OUTPATIENT_CLINIC_OR_DEPARTMENT_OTHER): Payer: Medicare Other | Admitting: Oncology

## 2020-10-23 ENCOUNTER — Other Ambulatory Visit: Payer: Self-pay

## 2020-10-23 ENCOUNTER — Inpatient Hospital Stay: Payer: Medicare Other

## 2020-10-23 ENCOUNTER — Ambulatory Visit (HOSPITAL_COMMUNITY)
Admission: RE | Admit: 2020-10-23 | Discharge: 2020-10-23 | Disposition: A | Discharge status: Home / Self Care | Payer: Medicare Other | Source: Ambulatory Visit | Attending: Gynecologic Oncology | Admitting: Gynecologic Oncology

## 2020-10-23 VITALS — BP 133/52 | HR 94 | Temp 97.8°F | Resp 18 | Ht 64.0 in | Wt 160.6 lb

## 2020-10-23 DIAGNOSIS — M3119 Other thrombotic microangiopathy: Secondary | ICD-10-CM | POA: Diagnosis not present

## 2020-10-23 DIAGNOSIS — Z862 Personal history of diseases of the blood and blood-forming organs and certain disorders involving the immune mechanism: Secondary | ICD-10-CM

## 2020-10-23 DIAGNOSIS — N949 Unspecified condition associated with female genital organs and menstrual cycle: Secondary | ICD-10-CM | POA: Insufficient documentation

## 2020-10-23 LAB — CBC WITH DIFFERENTIAL (CANCER CENTER ONLY)
Abs Immature Granulocytes: 0.03 10*3/uL (ref 0.00–0.07)
Basophils Absolute: 0.1 10*3/uL (ref 0.0–0.1)
Basophils Relative: 1 %
Eosinophils Absolute: 0.2 10*3/uL (ref 0.0–0.5)
Eosinophils Relative: 2 %
HCT: 41.8 % (ref 36.0–46.0)
Hemoglobin: 12.9 g/dL (ref 12.0–15.0)
Immature Granulocytes: 0 %
Lymphocytes Relative: 38 %
Lymphs Abs: 3 10*3/uL (ref 0.7–4.0)
MCH: 29.3 pg (ref 26.0–34.0)
MCHC: 30.9 g/dL (ref 30.0–36.0)
MCV: 95 fL (ref 80.0–100.0)
Monocytes Absolute: 0.7 10*3/uL (ref 0.1–1.0)
Monocytes Relative: 9 %
Neutro Abs: 4 10*3/uL (ref 1.7–7.7)
Neutrophils Relative %: 50 %
Platelet Count: 186 10*3/uL (ref 150–400)
RBC: 4.4 MIL/uL (ref 3.87–5.11)
RDW: 14.7 % (ref 11.5–15.5)
WBC Count: 7.9 10*3/uL (ref 4.0–10.5)
nRBC: 0 % (ref 0.0–0.2)

## 2020-10-23 LAB — RETIC PANEL
Immature Retic Fract: 19.1 % — ABNORMAL HIGH (ref 2.3–15.9)
RBC.: 4.38 MIL/uL (ref 3.87–5.11)
Retic Count, Absolute: 134.9 10*3/uL (ref 19.0–186.0)
Retic Ct Pct: 3.1 % (ref 0.4–3.1)
Reticulocyte Hemoglobin: 29.1 pg (ref >27.9)

## 2020-10-23 LAB — LACTATE DEHYDROGENASE: LDH: 181 U/L (ref 98–192)

## 2020-10-23 NOTE — Progress Notes (Signed)
[place] OFFICE PROGRESS NOTE   Diagnosis: TTP  INTERVAL HISTORY:    [name] returns as scheduled.  She continues prednisone at a dose of 20 mg daily.  No bleeding.  No complaint.  She continues smoking.  She was evaluated by Dr. Sherral Hammers at [facility] on 10/12/2020.  The ADAMTS13 activity returned at 49% without a measurable inhibitor.  Dr. Sherral Hammers recommends we consider a repeat course of rituximab every 3 to 4 months.  Objective:  Vital signs in last 24 hours:  Blood pressure (!) 133/52, pulse 94, temperature 97.8 F (36.6 C), temperature source Oral, resp. rate 18, height 5\' 4"  (1.626 m), weight 160 lb 9.6 oz (72.8 kg), SpO2 97 %.    HEENT: Mild white coat over the tongue, no buccal thrush Resp: Scattered wheeze and rhonchi, no respiratory distress Cardio: Regular rate and rhythm GI: No hepatosplenomegaly Vascular: No leg edema    Lab Results:  Lab Results  Component Value Date   WBC 7.9 10/23/2020   HGB 12.9 10/23/2020   HCT 41.8 10/23/2020   MCV 95.0 10/23/2020   PLT 186 10/23/2020   NEUTROABS 4.0 10/23/2020    CMP  Lab Results  Component Value Date   NA 144 10/07/2020   K 4.3 10/07/2020   CL 106 10/07/2020   CO2 28 10/07/2020   GLUCOSE 96 10/07/2020   BUN 20 10/07/2020   CREATININE 1.05 (H) 10/07/2020   CALCIUM 9.5 10/07/2020   PROT 6.1 (L) 10/07/2020   ALBUMIN 3.3 (L) 10/07/2020   AST 17 10/07/2020   ALT 17 10/07/2020   ALKPHOS 55 10/07/2020   BILITOT 0.3 10/07/2020   GFRNONAA 54 (L) 10/07/2020   GFRAA 50 (L) 11/25/2019    No results found for: CEA1  Lab Results  Component Value Date   INR 1.1 07/16/2020    Imaging:  No results found.  Medications: I have reviewed the patient's current medications.   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-plasma exchange, steroids, rituximab Relapse of TTP March 2015-plasma exchange,  steroids, rituximab, and maintenance azathioprine Admission with severe thrombocytopenia and elevated LDH 07/16/2020, ADAMTS13 activity less than 2%, consistent with relapse of TTP       -Steroids/FFP given 07/16/2020       -Daily plasma exchange beginning 07/17/2020, 7 days, then qod starting 3/24, discharge 07/21/2020 on prednisone 60 mg daily       -Prednisone taper to 40 mg daily 07/29/2020       -rituximab 07/30/2020, 08/06/2020, 08/13/2020, 08/20/2020       -plasmapheresis on 07/31/2020, 08/03/2020, 08/05/2020, and 08/07/2020       -prednisone increased to 60 mg daily 08/17/2020       -Plasmapheresis 08/24/2020, 08/25/2020, 08/26/2020, 08/27/2020, 08/28/2020, 08/29/2020, 08/31/2020, 09/02/2020, 09/04/2020       -caplacizumab initiated 08/24/2020       -Decrease prednisone to 40 mg daily 08/28/2020       -ADAMTS13 37% on 09/04/2020       -Prednisone decreased to 30 mg daily 09/07/2020       -Continue caplacizumab       -prednisone decreased to 20 mg daily beginning 09/18/2020  -Cablivi discontinued 09/21/2020   -prednisone discontinued 09/24/2020, resumed at a dose of 20 mg daily on 10/01/2020       -prednisone taper to 10 mg daily 10/23/2020 History of epidural abscess requiring laminectomy 2012 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 Cristina Gong- no active bleeding or blood in the stomach, nor any prospective bleeding site, seen on exam.     Disposition: [name] appears stable.  The platelet count, hemoglobin, and LDH are in the normal range today.  She will decrease her prednisone to 10 mg daily.  She will return for an office visit in 2 weeks.  We will repeat the ADAMTS13 activity when she is here in 3 weeks.  Betsy Coder, MD  10/23/2020  1:36 PM

## 2020-10-28 ENCOUNTER — Telehealth: Payer: Self-pay | Admitting: Gynecologic Oncology

## 2020-10-28 DIAGNOSIS — N949 Unspecified condition associated with female genital organs and menstrual cycle: Secondary | ICD-10-CM

## 2020-10-28 NOTE — Telephone Encounter (Signed)
Called the patient and reviewed ultrasound results.  Lesion is complex secondary to mural nodule and a small peripheral septation without definitive blood flow seen to the nodule.  We discussed that given these 2 findings, this classifies the lesion is complex.  Given no significant change in size in about 4 years, I still feel that this is very likely to be a benign lesion.  At our first visit, the patient and I had discussed her desire to avoid surgery and my concern regarding her risk for surgery given comorbidities.  I still think that it is safest to continue with surveillance of the ovary.  I would like to plan to get a pelvic ultrasound again in 3 months.  If we show stable size and character of the ovary, then we will plan to repeat imaging in 6 months.  If there is been significant change to the ovary and increased concern for possible malignancy, then we will revisit again the possibility/need for surgery.  Jeral Pinch MD Gynecologic Oncology

## 2020-11-03 ENCOUNTER — Other Ambulatory Visit: Payer: Self-pay

## 2020-11-03 MED ORDER — PREDNISONE 20 MG PO TABS: 10.0000 mg | ORAL_TABLET | Freq: Every day | ORAL | 0 refills | Status: DC

## 2020-11-06 ENCOUNTER — Other Ambulatory Visit: Payer: Self-pay

## 2020-11-06 ENCOUNTER — Inpatient Hospital Stay (HOSPITAL_BASED_OUTPATIENT_CLINIC_OR_DEPARTMENT_OTHER): Payer: Medicare Other | Admitting: Nurse Practitioner

## 2020-11-06 ENCOUNTER — Telehealth: Payer: Self-pay | Admitting: *Deleted

## 2020-11-06 ENCOUNTER — Encounter: Payer: Self-pay | Admitting: Nurse Practitioner

## 2020-11-06 ENCOUNTER — Inpatient Hospital Stay: Payer: Medicare Other | Attending: Oncology

## 2020-11-06 VITALS — BP 149/56 | HR 94 | Temp 97.8°F | Resp 20 | Ht 64.0 in | Wt 161.2 lb

## 2020-11-06 DIAGNOSIS — M3119 Other thrombotic microangiopathy: Secondary | ICD-10-CM

## 2020-11-06 DIAGNOSIS — Z79899 Other long term (current) drug therapy: Secondary | ICD-10-CM | POA: Insufficient documentation

## 2020-11-06 DIAGNOSIS — G8929 Other chronic pain: Secondary | ICD-10-CM | POA: Diagnosis not present

## 2020-11-06 DIAGNOSIS — D649 Anemia, unspecified: Secondary | ICD-10-CM | POA: Insufficient documentation

## 2020-11-06 DIAGNOSIS — M549 Dorsalgia, unspecified: Secondary | ICD-10-CM | POA: Insufficient documentation

## 2020-11-06 DIAGNOSIS — J449 Chronic obstructive pulmonary disease, unspecified: Secondary | ICD-10-CM | POA: Insufficient documentation

## 2020-11-06 LAB — CMP (CANCER CENTER ONLY)
ALT: 19 U/L (ref 0–44)
AST: 19 U/L (ref 15–41)
Albumin: 4 g/dL (ref 3.5–5.0)
Alkaline Phosphatase: 48 U/L (ref 38–126)
Anion gap: 8 (ref 5–15)
BUN: 23 mg/dL (ref 8–23)
CO2: 29 mmol/L (ref 22–32)
Calcium: 10.1 mg/dL (ref 8.9–10.3)
Chloride: 106 mmol/L (ref 98–111)
Creatinine: 1.11 mg/dL — ABNORMAL HIGH (ref 0.44–1.00)
GFR, Estimated: 51 mL/min — ABNORMAL LOW (ref >60)
Glucose, Bld: 89 mg/dL (ref 70–99)
Potassium: 3.6 mmol/L (ref 3.5–5.1)
Sodium: 143 mmol/L (ref 135–145)
Total Bilirubin: 0.3 mg/dL (ref 0.3–1.2)
Total Protein: 6.3 g/dL — ABNORMAL LOW (ref 6.5–8.1)

## 2020-11-06 LAB — CBC WITH DIFFERENTIAL (CANCER CENTER ONLY)
Abs Immature Granulocytes: 0.04 10*3/uL (ref 0.00–0.07)
Basophils Absolute: 0.1 10*3/uL (ref 0.0–0.1)
Basophils Relative: 1 %
Eosinophils Absolute: 0.2 10*3/uL (ref 0.0–0.5)
Eosinophils Relative: 2 %
HCT: 45.4 % (ref 36.0–46.0)
Hemoglobin: 14.1 g/dL (ref 12.0–15.0)
Immature Granulocytes: 0 %
Lymphocytes Relative: 33 %
Lymphs Abs: 3.6 10*3/uL (ref 0.7–4.0)
MCH: 29 pg (ref 26.0–34.0)
MCHC: 31.1 g/dL (ref 30.0–36.0)
MCV: 93.2 fL (ref 80.0–100.0)
Monocytes Absolute: 0.9 10*3/uL (ref 0.1–1.0)
Monocytes Relative: 8 %
Neutro Abs: 6.1 10*3/uL (ref 1.7–7.7)
Neutrophils Relative %: 56 %
Platelet Count: 153 10*3/uL (ref 150–400)
RBC: 4.87 MIL/uL (ref 3.87–5.11)
RDW: 14.4 % (ref 11.5–15.5)
WBC Count: 10.8 10*3/uL — ABNORMAL HIGH (ref 4.0–10.5)
nRBC: 0 % (ref 0.0–0.2)

## 2020-11-06 LAB — LACTATE DEHYDROGENASE: LDH: 210 U/L — ABNORMAL HIGH (ref 98–192)

## 2020-11-06 NOTE — Progress Notes (Signed)
[place] OFFICE PROGRESS NOTE   Diagnosis: TTP  INTERVAL HISTORY:   Ms. Dorantes returns as scheduled.  She continues prednisone 10 mg daily.  She overall is feeling well.  No unusual headaches.  No vision change.  No change in baseline dyspnea and cough.  No falls.  She denies bleeding.  Objective:  Vital signs in last 24 hours:  Blood pressure (!) 149/56, pulse 94, temperature 97.8 F (36.6 C), temperature source Oral, resp. rate 20, height 5\' 4"  (1.626 m), weight 161 lb 3.2 oz (73.1 kg), SpO2 96 %.    HEENT: No thrush or ulcers. Resp: Coarse bilateral expiratory wheezes.  No respiratory distress. Cardio: Regular rate and rhythm. GI: Abdomen soft and nontender.  No hepatosplenomegaly. Vascular: No leg edema. Skin: Few small ecchymoses bilateral forearms.   Lab Results:  Lab Results  Component Value Date   WBC 10.8 (H) 11/06/2020   HGB 14.1 11/06/2020   HCT 45.4 11/06/2020   MCV 93.2 11/06/2020   PLT 153 11/06/2020   NEUTROABS 6.1 11/06/2020    Imaging:  No results found.  Medications: I have reviewed the patient's current medications.  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-plasma exchange, steroids, rituximab Relapse of TTP March 2015-plasma exchange, steroids, rituximab, and maintenance azathioprine Admission with severe thrombocytopenia and elevated LDH 07/16/2020, ADAMTS13 activity less than 2%, consistent with relapse of TTP       -Steroids/FFP given 07/16/2020       -Daily plasma exchange beginning 07/17/2020, 7 days, then qod starting 3/24, discharge 07/21/2020 on prednisone 60 mg daily       -Prednisone taper to 40 mg daily 07/29/2020       -rituximab 07/30/2020, 08/06/2020, 08/13/2020, 08/20/2020       -plasmapheresis on 07/31/2020, 08/03/2020, 08/05/2020, and 08/07/2020       -prednisone increased to 60 mg daily 08/17/2020       -Plasmapheresis 08/24/2020,  08/25/2020, 08/26/2020, 08/27/2020, 08/28/2020, 08/29/2020, 08/31/2020, 09/02/2020, 09/04/2020       -caplacizumab initiated 08/24/2020       -Decrease prednisone to 40 mg daily 08/28/2020       -ADAMTS13 37% on 09/04/2020       -Prednisone decreased to 30 mg daily 09/07/2020       -Continue caplacizumab       -prednisone decreased to 20 mg daily beginning 09/18/2020       -Cablivi discontinued 09/21/2020       -prednisone discontinued 09/24/2020, resumed at a dose of 20 mg daily on 10/01/2020       -prednisone taper to 10 mg daily 10/23/2020  -Prednisone taper to 5 mg daily 11/06/2020 History of epidural abscess requiring laminectomy 2012 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 Cristina Gong- no active bleeding or blood in the stomach, nor any prospective bleeding site, seen on exam.  Disposition: Ms. Bidwell appears stable.  Hemoglobin continues to improve.  Platelet count is lower but remains in normal range.  We will follow-up on the chemistry panel, LDH and ADAMTS13 activity. She will taper prednisone to 5 mg daily.  Plan repeat CBC in 2 weeks.  Follow-up appointment in 1 month.  We are available to see her sooner if needed.  Plan reviewed with  Dr. Benay Spice.   [name] ANP/GNP-BC   11/06/2020  1:55 PM

## 2020-11-06 NOTE — Telephone Encounter (Signed)
Per Dr Berline Lopes scheduled the patient for an [place] scan on 9/29 and a phone visits  on 9/30. Called and left a message for the patient with appt date/times.

## 2020-11-10 LAB — ADAMTS13 ANTIBODY: ADAMTS13 Antibody: 6 Units/mL (ref <12)

## 2020-11-10 LAB — ADAMTS13 ACTIVITY: Adamts 13 Activity: 5.4 % — CL (ref >66.8)

## 2020-11-12 ENCOUNTER — Encounter: Payer: Self-pay | Admitting: Oncology

## 2020-11-20 ENCOUNTER — Other Ambulatory Visit: Payer: Self-pay

## 2020-11-20 ENCOUNTER — Inpatient Hospital Stay: Payer: Medicare Other

## 2020-11-20 DIAGNOSIS — M3119 Other thrombotic microangiopathy: Secondary | ICD-10-CM | POA: Diagnosis not present

## 2020-11-20 LAB — CBC WITH DIFFERENTIAL (CANCER CENTER ONLY)
Abs Immature Granulocytes: 0.02 10*3/uL (ref 0.00–0.07)
Basophils Absolute: 0.1 10*3/uL (ref 0.0–0.1)
Basophils Relative: 1 %
Eosinophils Absolute: 0.2 10*3/uL (ref 0.0–0.5)
Eosinophils Relative: 2 %
HCT: 48.1 % — ABNORMAL HIGH (ref 36.0–46.0)
Hemoglobin: 15.1 g/dL — ABNORMAL HIGH (ref 12.0–15.0)
Immature Granulocytes: 0 %
Lymphocytes Relative: 36 %
Lymphs Abs: 3.2 10*3/uL (ref 0.7–4.0)
MCH: 28.9 pg (ref 26.0–34.0)
MCHC: 31.4 g/dL (ref 30.0–36.0)
MCV: 92 fL (ref 80.0–100.0)
Monocytes Absolute: 1 10*3/uL (ref 0.1–1.0)
Monocytes Relative: 11 %
Neutro Abs: 4.5 10*3/uL (ref 1.7–7.7)
Neutrophils Relative %: 50 %
Platelet Count: 136 10*3/uL — ABNORMAL LOW (ref 150–400)
RBC: 5.23 MIL/uL — ABNORMAL HIGH (ref 3.87–5.11)
RDW: 14.5 % (ref 11.5–15.5)
WBC Count: 8.9 10*3/uL (ref 4.0–10.5)
nRBC: 0 % (ref 0.0–0.2)

## 2020-12-03 ENCOUNTER — Other Ambulatory Visit: Payer: Self-pay

## 2020-12-03 ENCOUNTER — Inpatient Hospital Stay: Payer: Medicare Other

## 2020-12-03 ENCOUNTER — Inpatient Hospital Stay: Payer: Medicare Other | Attending: Oncology | Admitting: Oncology

## 2020-12-03 VITALS — BP 135/58 | HR 87 | Temp 97.8°F | Resp 20 | Ht 64.0 in | Wt 164.8 lb

## 2020-12-03 DIAGNOSIS — D649 Anemia, unspecified: Secondary | ICD-10-CM | POA: Insufficient documentation

## 2020-12-03 DIAGNOSIS — R195 Other fecal abnormalities: Secondary | ICD-10-CM | POA: Insufficient documentation

## 2020-12-03 DIAGNOSIS — Z79899 Other long term (current) drug therapy: Secondary | ICD-10-CM | POA: Insufficient documentation

## 2020-12-03 DIAGNOSIS — J449 Chronic obstructive pulmonary disease, unspecified: Secondary | ICD-10-CM | POA: Diagnosis not present

## 2020-12-03 DIAGNOSIS — G8929 Other chronic pain: Secondary | ICD-10-CM | POA: Diagnosis not present

## 2020-12-03 DIAGNOSIS — M3119 Other thrombotic microangiopathy: Secondary | ICD-10-CM

## 2020-12-03 LAB — CBC WITH DIFFERENTIAL (CANCER CENTER ONLY)
Abs Immature Granulocytes: 0.03 10*3/uL (ref 0.00–0.07)
Basophils Absolute: 0.1 10*3/uL (ref 0.0–0.1)
Basophils Relative: 1 %
Eosinophils Absolute: 0.2 10*3/uL (ref 0.0–0.5)
Eosinophils Relative: 2 %
HCT: 47.9 % — ABNORMAL HIGH (ref 36.0–46.0)
Hemoglobin: 14.8 g/dL (ref 12.0–15.0)
Immature Granulocytes: 0 %
Lymphocytes Relative: 42 %
Lymphs Abs: 3 10*3/uL (ref 0.7–4.0)
MCH: 27.9 pg (ref 26.0–34.0)
MCHC: 30.9 g/dL (ref 30.0–36.0)
MCV: 90.2 fL (ref 80.0–100.0)
Monocytes Absolute: 0.8 10*3/uL (ref 0.1–1.0)
Monocytes Relative: 11 %
Neutro Abs: 3.1 10*3/uL (ref 1.7–7.7)
Neutrophils Relative %: 44 %
Platelet Count: 130 10*3/uL — ABNORMAL LOW (ref 150–400)
RBC: 5.31 MIL/uL — ABNORMAL HIGH (ref 3.87–5.11)
RDW: 14.2 % (ref 11.5–15.5)
WBC Count: 7.2 10*3/uL (ref 4.0–10.5)
nRBC: 0 % (ref 0.0–0.2)

## 2020-12-03 LAB — CMP (CANCER CENTER ONLY)
ALT: 18 U/L (ref 0–44)
AST: 21 U/L (ref 15–41)
Albumin: 4 g/dL (ref 3.5–5.0)
Alkaline Phosphatase: 49 U/L (ref 38–126)
Anion gap: 7 (ref 5–15)
BUN: 28 mg/dL — ABNORMAL HIGH (ref 8–23)
CO2: 29 mmol/L (ref 22–32)
Calcium: 9.4 mg/dL (ref 8.9–10.3)
Chloride: 106 mmol/L (ref 98–111)
Creatinine: 1.05 mg/dL — ABNORMAL HIGH (ref 0.44–1.00)
GFR, Estimated: 54 mL/min — ABNORMAL LOW (ref >60)
Glucose, Bld: 101 mg/dL — ABNORMAL HIGH (ref 70–99)
Potassium: 4.4 mmol/L (ref 3.5–5.1)
Sodium: 142 mmol/L (ref 135–145)
Total Bilirubin: 0.3 mg/dL (ref 0.3–1.2)
Total Protein: 6.4 g/dL — ABNORMAL LOW (ref 6.5–8.1)

## 2020-12-03 LAB — LACTATE DEHYDROGENASE: LDH: 231 U/L — ABNORMAL HIGH (ref 98–192)

## 2020-12-03 NOTE — Progress Notes (Signed)
[place] OFFICE PROGRESS NOTE   Diagnosis: TTP  INTERVAL HISTORY:   [name] returns as scheduled.  She feels well.  No complaint.  She continues smoking.  Objective:  Vital signs in last 24 hours:  Blood pressure (!) 135/58, pulse 87, temperature 97.8 F (36.6 C), temperature source Oral, resp. rate 20, height '5\' 4"'$  (1.626 m), weight 164 lb 12.8 oz (74.8 kg), SpO2 94 %.    HEENT: Small ulcer of the left buccal mucosa, mild white coat over the tongue Resp: Distant breath sounds Gatter inspiratory rhonchi, no respiratory distress Cardio: Regular rate and rhythm, 2/6 diastolic murmur GI: No hepatosplenomegaly Vascular: No leg edema  Skin: Small ecchymoses at the forearms   Lab Results:  Lab Results  Component Value Date   WBC 7.2 12/03/2020   HGB 14.8 12/03/2020   HCT 47.9 (H) 12/03/2020   MCV 90.2 12/03/2020   PLT 130 (L) 12/03/2020   NEUTROABS 3.1 12/03/2020    CMP  Lab Results  Component Value Date   NA 142 12/03/2020   K 4.4 12/03/2020   CL 106 12/03/2020   CO2 29 12/03/2020   GLUCOSE 101 (H) 12/03/2020   BUN 28 (H) 12/03/2020   CREATININE 1.05 (H) 12/03/2020   CALCIUM 9.4 12/03/2020   PROT 6.4 (L) 12/03/2020   ALBUMIN 4.0 12/03/2020   AST 21 12/03/2020   ALT 18 12/03/2020   ALKPHOS 49 12/03/2020   BILITOT 0.3 12/03/2020   GFRNONAA 54 (L) 12/03/2020   GFRAA 50 (L) 11/25/2019     Medications: I have reviewed the patient's current medications.   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-plasma exchange, steroids, rituximab Relapse of TTP March 2015-plasma exchange, steroids, rituximab, and maintenance azathioprine Admission with severe thrombocytopenia and elevated LDH 07/16/2020, ADAMTS13 activity less than 2%, consistent with relapse of TTP       -Steroids/FFP given 07/16/2020       -Daily plasma exchange beginning 07/17/2020, 7 days,  then qod starting 3/24, discharge 07/21/2020 on prednisone 60 mg daily       -Prednisone taper to 40 mg daily 07/29/2020       -rituximab 07/30/2020, 08/06/2020, 08/13/2020, 08/20/2020       -plasmapheresis on 07/31/2020, 08/03/2020, 08/05/2020, and 08/07/2020       -prednisone increased to 60 mg daily 08/17/2020       -Plasmapheresis 08/24/2020, 08/25/2020, 08/26/2020, 08/27/2020, 08/28/2020, 08/29/2020, 08/31/2020, 09/02/2020, 09/04/2020       -caplacizumab initiated 08/24/2020       -Decrease prednisone to 40 mg daily 08/28/2020       -ADAMTS13 37% on 09/04/2020       -Prednisone decreased to 30 mg daily 09/07/2020       -Continue caplacizumab       -prednisone decreased to 20 mg daily beginning 09/18/2020       -Cablivi discontinued 09/21/2020       -prednisone discontinued 09/24/2020, resumed at a dose of 20 mg daily on 10/01/2020       -prednisone taper to 10 mg daily 10/23/2020  -Prednisone taper to 5 mg daily 11/06/2020       -prednisone continued at a dose of 5 mg daily, Imuran resumed at 50 mg daily 12/03/2020 History of epidural abscess requiring laminectomy 2012 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 Cristina Gong- no active bleeding or blood in the stomach, nor any prospective bleeding site, seen on exam.    Disposition: [name] appears stable.  The platelet count is mildly decreased.  The LDH is mildly elevated.  The Adamts13 activity was low last month.  She will continue prednisone at the current dose.  She would like to resume azathioprine.  I think this is reasonable given the long remission she had while on azathioprine.  She will return for lab visit in 2 weeks and an office visit in 4 weeks.  Betsy Coder, MD  12/03/2020  11:26 AM

## 2020-12-08 LAB — ADAMTS13 ANTIBODY: ADAMTS13 Antibody: 9 Units/mL (ref <12)

## 2020-12-08 LAB — ADAMTS13 ACTIVITY: Adamts 13 Activity: 2.1 % — CL (ref >66.8)

## 2020-12-17 ENCOUNTER — Inpatient Hospital Stay: Payer: Medicare Other

## 2020-12-17 ENCOUNTER — Other Ambulatory Visit: Payer: Self-pay

## 2020-12-17 DIAGNOSIS — M3119 Other thrombotic microangiopathy: Secondary | ICD-10-CM

## 2020-12-17 LAB — CBC WITH DIFFERENTIAL (CANCER CENTER ONLY)
Abs Immature Granulocytes: 0.01 10*3/uL (ref 0.00–0.07)
Basophils Absolute: 0.1 10*3/uL (ref 0.0–0.1)
Basophils Relative: 1 %
Eosinophils Absolute: 0.2 10*3/uL (ref 0.0–0.5)
Eosinophils Relative: 3 %
HCT: 47.8 % — ABNORMAL HIGH (ref 36.0–46.0)
Hemoglobin: 14.8 g/dL (ref 12.0–15.0)
Immature Granulocytes: 0 %
Lymphocytes Relative: 40 %
Lymphs Abs: 2.9 10*3/uL (ref 0.7–4.0)
MCH: 28.2 pg (ref 26.0–34.0)
MCHC: 31 g/dL (ref 30.0–36.0)
MCV: 91 fL (ref 80.0–100.0)
Monocytes Absolute: 0.7 10*3/uL (ref 0.1–1.0)
Monocytes Relative: 10 %
Neutro Abs: 3.4 10*3/uL (ref 1.7–7.7)
Neutrophils Relative %: 46 %
Platelet Count: 105 10*3/uL — ABNORMAL LOW (ref 150–400)
RBC: 5.25 MIL/uL — ABNORMAL HIGH (ref 3.87–5.11)
RDW: 15 % (ref 11.5–15.5)
WBC Count: 7.4 10*3/uL (ref 4.0–10.5)
nRBC: 0 % (ref 0.0–0.2)

## 2020-12-17 LAB — CMP (CANCER CENTER ONLY)
ALT: 15 U/L (ref 0–44)
AST: 20 U/L (ref 15–41)
Albumin: 3.7 g/dL (ref 3.5–5.0)
Alkaline Phosphatase: 54 U/L (ref 38–126)
Anion gap: 8 (ref 5–15)
BUN: 21 mg/dL (ref 8–23)
CO2: 29 mmol/L (ref 22–32)
Calcium: 9.5 mg/dL (ref 8.9–10.3)
Chloride: 105 mmol/L (ref 98–111)
Creatinine: 1.07 mg/dL — ABNORMAL HIGH (ref 0.44–1.00)
GFR, Estimated: 53 mL/min — ABNORMAL LOW (ref >60)
Glucose, Bld: 96 mg/dL (ref 70–99)
Potassium: 4.7 mmol/L (ref 3.5–5.1)
Sodium: 142 mmol/L (ref 135–145)
Total Bilirubin: 0.3 mg/dL (ref 0.3–1.2)
Total Protein: 6 g/dL — ABNORMAL LOW (ref 6.5–8.1)

## 2020-12-17 LAB — LACTATE DEHYDROGENASE: LDH: 241 U/L — ABNORMAL HIGH (ref 98–192)

## 2020-12-21 ENCOUNTER — Other Ambulatory Visit: Payer: Self-pay

## 2020-12-21 ENCOUNTER — Telehealth: Payer: Self-pay

## 2020-12-21 DIAGNOSIS — M3119 Other thrombotic microangiopathy: Secondary | ICD-10-CM

## 2020-12-21 NOTE — Telephone Encounter (Signed)
TC to Pt per Dr Benay Spice informing her of low platelet count informed Pt Dr Benay Spice would like her to continue same treatment and wants her to come in for a repeat lab test and he will consider other treatment options if platelet count is lower after repeat labs on 12/24/20. Pt verbalized understanding No further problems or concerns noted.

## 2020-12-21 NOTE — Telephone Encounter (Signed)
-----   Message from Ladell Pier, MD sent at 12/18/2020  4:24 PM EDT ----- Please call patient, platelet count slowly following, hemoglobin remains normal, continue current treatment, will need to consider other treatment options if the platelet count continues to fall, repeat CBC on 12/24/2020

## 2020-12-24 ENCOUNTER — Inpatient Hospital Stay: Payer: Medicare Other

## 2020-12-24 ENCOUNTER — Other Ambulatory Visit: Payer: Self-pay

## 2020-12-24 DIAGNOSIS — M3119 Other thrombotic microangiopathy: Secondary | ICD-10-CM | POA: Diagnosis not present

## 2020-12-24 LAB — CBC WITH DIFFERENTIAL (CANCER CENTER ONLY)
Abs Immature Granulocytes: 0.03 10*3/uL (ref 0.00–0.07)
Basophils Absolute: 0.1 10*3/uL (ref 0.0–0.1)
Basophils Relative: 1 %
Eosinophils Absolute: 0.2 10*3/uL (ref 0.0–0.5)
Eosinophils Relative: 3 %
HCT: 49.7 % — ABNORMAL HIGH (ref 36.0–46.0)
Hemoglobin: 15.4 g/dL — ABNORMAL HIGH (ref 12.0–15.0)
Immature Granulocytes: 0 %
Lymphocytes Relative: 45 %
Lymphs Abs: 3.6 10*3/uL (ref 0.7–4.0)
MCH: 28 pg (ref 26.0–34.0)
MCHC: 31 g/dL (ref 30.0–36.0)
MCV: 90.4 fL (ref 80.0–100.0)
Monocytes Absolute: 0.6 10*3/uL (ref 0.1–1.0)
Monocytes Relative: 8 %
Neutro Abs: 3.4 10*3/uL (ref 1.7–7.7)
Neutrophils Relative %: 43 %
Platelet Count: 108 10*3/uL — ABNORMAL LOW (ref 150–400)
RBC: 5.5 MIL/uL — ABNORMAL HIGH (ref 3.87–5.11)
RDW: 15.1 % (ref 11.5–15.5)
WBC Count: 7.9 10*3/uL (ref 4.0–10.5)
nRBC: 0 % (ref 0.0–0.2)

## 2020-12-25 ENCOUNTER — Telehealth: Payer: Self-pay

## 2020-12-25 NOTE — Telephone Encounter (Signed)
-----   Message from Ladell Pier, MD sent at 12/24/2020  5:14 PM EDT ----- Please call patient, the platelet count is stable, hemoglobin normal, follow-up as scheduled

## 2020-12-25 NOTE — Telephone Encounter (Signed)
TC to Pt per Dr Benay Spice spoke with Pt's husband who said Pt was napping and will give her the information. Informed him that Pt's platelet count is stable and her hemoglobin was normal and to follow up as scheduled. Pt. Husband inquired about next appointment date. Confirmed appointment date with him. 12/31/20

## 2020-12-31 ENCOUNTER — Other Ambulatory Visit: Payer: Self-pay

## 2020-12-31 ENCOUNTER — Inpatient Hospital Stay: Payer: Medicare Other

## 2020-12-31 ENCOUNTER — Inpatient Hospital Stay: Payer: Medicare Other | Attending: Oncology | Admitting: Nurse Practitioner

## 2020-12-31 ENCOUNTER — Encounter: Payer: Self-pay | Admitting: Nurse Practitioner

## 2020-12-31 VITALS — BP 134/60 | HR 92 | Temp 97.8°F | Resp 20 | Ht 64.0 in | Wt 167.4 lb

## 2020-12-31 DIAGNOSIS — H538 Other visual disturbances: Secondary | ICD-10-CM | POA: Insufficient documentation

## 2020-12-31 DIAGNOSIS — M3119 Other thrombotic microangiopathy: Secondary | ICD-10-CM | POA: Insufficient documentation

## 2020-12-31 DIAGNOSIS — Z79899 Other long term (current) drug therapy: Secondary | ICD-10-CM | POA: Diagnosis not present

## 2020-12-31 DIAGNOSIS — D649 Anemia, unspecified: Secondary | ICD-10-CM | POA: Insufficient documentation

## 2020-12-31 DIAGNOSIS — J449 Chronic obstructive pulmonary disease, unspecified: Secondary | ICD-10-CM | POA: Insufficient documentation

## 2020-12-31 DIAGNOSIS — G8929 Other chronic pain: Secondary | ICD-10-CM | POA: Diagnosis not present

## 2020-12-31 DIAGNOSIS — M549 Dorsalgia, unspecified: Secondary | ICD-10-CM | POA: Diagnosis not present

## 2020-12-31 LAB — CMP (CANCER CENTER ONLY)
ALT: 14 U/L (ref 0–44)
AST: 17 U/L (ref 15–41)
Albumin: 3.8 g/dL (ref 3.5–5.0)
Alkaline Phosphatase: 43 U/L (ref 38–126)
Anion gap: 6 (ref 5–15)
BUN: 27 mg/dL — ABNORMAL HIGH (ref 8–23)
CO2: 30 mmol/L (ref 22–32)
Calcium: 9.6 mg/dL (ref 8.9–10.3)
Chloride: 105 mmol/L (ref 98–111)
Creatinine: 1.09 mg/dL — ABNORMAL HIGH (ref 0.44–1.00)
GFR, Estimated: 52 mL/min — ABNORMAL LOW (ref >60)
Glucose, Bld: 99 mg/dL (ref 70–99)
Potassium: 4.9 mmol/L (ref 3.5–5.1)
Sodium: 141 mmol/L (ref 135–145)
Total Bilirubin: 0.4 mg/dL (ref 0.3–1.2)
Total Protein: 6.6 g/dL (ref 6.5–8.1)

## 2020-12-31 LAB — CBC WITH DIFFERENTIAL (CANCER CENTER ONLY)
Abs Immature Granulocytes: 0.02 10*3/uL (ref 0.00–0.07)
Basophils Absolute: 0 10*3/uL (ref 0.0–0.1)
Basophils Relative: 1 %
Eosinophils Absolute: 0.2 10*3/uL (ref 0.0–0.5)
Eosinophils Relative: 2 %
HCT: 48.8 % — ABNORMAL HIGH (ref 36.0–46.0)
Hemoglobin: 15.6 g/dL — ABNORMAL HIGH (ref 12.0–15.0)
Immature Granulocytes: 0 %
Lymphocytes Relative: 42 %
Lymphs Abs: 2.9 10*3/uL (ref 0.7–4.0)
MCH: 28.5 pg (ref 26.0–34.0)
MCHC: 32 g/dL (ref 30.0–36.0)
MCV: 89.1 fL (ref 80.0–100.0)
Monocytes Absolute: 0.6 10*3/uL (ref 0.1–1.0)
Monocytes Relative: 9 %
Neutro Abs: 3.1 10*3/uL (ref 1.7–7.7)
Neutrophils Relative %: 46 %
Platelet Count: 114 10*3/uL — ABNORMAL LOW (ref 150–400)
RBC: 5.48 MIL/uL — ABNORMAL HIGH (ref 3.87–5.11)
RDW: 15.3 % (ref 11.5–15.5)
WBC Count: 6.7 10*3/uL (ref 4.0–10.5)
nRBC: 0 % (ref 0.0–0.2)

## 2020-12-31 LAB — LACTATE DEHYDROGENASE: LDH: 219 U/L — ABNORMAL HIGH (ref 98–192)

## 2020-12-31 NOTE — Progress Notes (Signed)
[place] OFFICE PROGRESS NOTE   Diagnosis: TTP  INTERVAL HISTORY:   [name] returns as scheduled.  She continues Imuran and prednisone.  She feels well.  No unusual headaches.  Intermittent blurred vision.  No change in baseline balance issues.  No fever.  No change in baseline cough and dyspnea on exertion.  No bleeding.  Objective:  Vital signs in last 24 hours:  Blood pressure 134/60, pulse 92, temperature 97.8 F (36.6 C), temperature source Oral, resp. rate 20, height '5\' 4"'$  (1.626 m), weight 167 lb 6.4 oz (75.9 kg), SpO2 99 %.    HEENT: No thrush or ulcers. Resp: Distant breath sounds.  No respiratory distress. Cardio: Regular rate and rhythm. GI: No hepatosplenomegaly. Vascular: No leg edema. Neuro: Alert and oriented.  Follows commands. Skin: No ecchymoses or petechiae.   Lab Results:  Lab Results  Component Value Date   WBC 6.7 12/31/2020   HGB 15.6 (H) 12/31/2020   HCT 48.8 (H) 12/31/2020   MCV 89.1 12/31/2020   PLT 114 (L) 12/31/2020   NEUTROABS 3.1 12/31/2020    Imaging:  No results found.  Medications: I have reviewed the patient's current medications.  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-plasma exchange, steroids, rituximab Relapse of TTP March 2015-plasma exchange, steroids, rituximab, and maintenance azathioprine Admission with severe thrombocytopenia and elevated LDH 07/16/2020, ADAMTS13 activity less than 2%, consistent with relapse of TTP       -Steroids/FFP given 07/16/2020       -Daily plasma exchange beginning 07/17/2020, 7 days, then qod starting 3/24, discharge 07/21/2020 on prednisone 60 mg daily       -Prednisone taper to 40 mg daily 07/29/2020       -rituximab 07/30/2020, 08/06/2020, 08/13/2020, 08/20/2020       -plasmapheresis on 07/31/2020, 08/03/2020, 08/05/2020, and 08/07/2020       -prednisone increased to 60 mg daily 08/17/2020        -Plasmapheresis 08/24/2020, 08/25/2020, 08/26/2020, 08/27/2020, 08/28/2020, 08/29/2020, 08/31/2020, 09/02/2020, 09/04/2020       -caplacizumab initiated 08/24/2020       -Decrease prednisone to 40 mg daily 08/28/2020       -ADAMTS13 37% on 09/04/2020       -Prednisone decreased to 30 mg daily 09/07/2020       -Continue caplacizumab       -prednisone decreased to 20 mg daily beginning 09/18/2020       -Cablivi discontinued 09/21/2020       -prednisone discontinued 09/24/2020, resumed at a dose of 20 mg daily on 10/01/2020       -prednisone taper to 10 mg daily 10/23/2020       -Prednisone taper to 5 mg daily 11/06/2020       -prednisone continued at a dose of 5 mg daily, Imuran resumed at 50 mg daily 12/03/2020 History of epidural abscess requiring laminectomy 2012 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 Cristina Gong- no active bleeding or blood in the stomach, nor any prospective bleeding site, seen on exam.        Disposition: [name] appears unchanged.  We reviewed the CBC from today.  Hemoglobin remains in normal range.  Platelets stable to slightly improved at 114.  She  will continue prednisone and Imuran as currently taking.  We will follow-up on the ADAMTS13 result from today.  Plan for CBC in 2 weeks.  Lab and follow-up in 4 weeks.  We are available to see her sooner if needed.    [name] ANP/GNP-BC   12/31/2020  12:16 PM

## 2021-01-01 ENCOUNTER — Other Ambulatory Visit: Payer: Self-pay

## 2021-01-01 ENCOUNTER — Other Ambulatory Visit: Payer: Self-pay | Admitting: Nurse Practitioner

## 2021-01-01 DIAGNOSIS — M3119 Other thrombotic microangiopathy: Secondary | ICD-10-CM

## 2021-01-05 ENCOUNTER — Other Ambulatory Visit: Payer: Self-pay | Admitting: Oncology

## 2021-01-05 ENCOUNTER — Other Ambulatory Visit: Payer: Self-pay | Admitting: Nurse Practitioner

## 2021-01-05 DIAGNOSIS — M3119 Other thrombotic microangiopathy: Secondary | ICD-10-CM

## 2021-01-07 LAB — ADAMTS13 ANTIBODY: ADAMTS13 Antibody: 7 Units/mL (ref <12)

## 2021-01-07 LAB — ADAMTS13 ACTIVITY: Adamts 13 Activity: 2.4 % — CL (ref >66.8)

## 2021-01-14 ENCOUNTER — Inpatient Hospital Stay: Payer: Medicare Other | Attending: Oncology

## 2021-01-14 ENCOUNTER — Other Ambulatory Visit: Payer: Self-pay

## 2021-01-14 DIAGNOSIS — M3119 Other thrombotic microangiopathy: Secondary | ICD-10-CM | POA: Insufficient documentation

## 2021-01-14 LAB — CBC WITH DIFFERENTIAL/PLATELET
Abs Immature Granulocytes: 0.03 10*3/uL (ref 0.00–0.07)
Basophils Absolute: 0.1 10*3/uL (ref 0.0–0.1)
Basophils Relative: 1 %
Eosinophils Absolute: 0.1 10*3/uL (ref 0.0–0.5)
Eosinophils Relative: 2 %
HCT: 48.2 % — ABNORMAL HIGH (ref 36.0–46.0)
Hemoglobin: 15.4 g/dL — ABNORMAL HIGH (ref 12.0–15.0)
Immature Granulocytes: 0 %
Lymphocytes Relative: 37 %
Lymphs Abs: 2.6 10*3/uL (ref 0.7–4.0)
MCH: 28.6 pg (ref 26.0–34.0)
MCHC: 32 g/dL (ref 30.0–36.0)
MCV: 89.4 fL (ref 80.0–100.0)
Monocytes Absolute: 0.5 10*3/uL (ref 0.1–1.0)
Monocytes Relative: 7 %
Neutro Abs: 3.6 10*3/uL (ref 1.7–7.7)
Neutrophils Relative %: 53 %
Platelets: 120 10*3/uL — ABNORMAL LOW (ref 150–400)
RBC: 5.39 MIL/uL — ABNORMAL HIGH (ref 3.87–5.11)
RDW: 15.7 % — ABNORMAL HIGH (ref 11.5–15.5)
WBC: 6.9 10*3/uL (ref 4.0–10.5)
nRBC: 0 % (ref 0.0–0.2)

## 2021-01-27 ENCOUNTER — Inpatient Hospital Stay: Payer: Medicare Other

## 2021-01-27 ENCOUNTER — Telehealth: Payer: Self-pay

## 2021-01-27 ENCOUNTER — Inpatient Hospital Stay: Payer: Medicare Other | Admitting: Oncology

## 2021-01-27 NOTE — Telephone Encounter (Signed)
TC to Pt to inquire about missed appointment. No answer. V/M message left.

## 2021-01-28 ENCOUNTER — Ambulatory Visit (HOSPITAL_COMMUNITY): Admission: RE | Admit: 2021-01-28 | Payer: Medicare Other | Source: Ambulatory Visit

## 2021-01-28 ENCOUNTER — Telehealth: Payer: Self-pay | Admitting: *Deleted

## 2021-01-28 NOTE — Telephone Encounter (Signed)
Called and spoke with the patient; her daughter is out of town. Appts for today and tomorrow were canceled per patient request. The patient stated "My daughter will call to reschedule once she is back."

## 2021-01-29 ENCOUNTER — Ambulatory Visit: Payer: Medicare Other | Admitting: Gynecologic Oncology

## 2021-02-11 ENCOUNTER — Inpatient Hospital Stay: Payer: Medicare Other

## 2021-02-11 ENCOUNTER — Telehealth: Payer: Self-pay

## 2021-02-11 ENCOUNTER — Other Ambulatory Visit: Payer: Self-pay

## 2021-02-11 ENCOUNTER — Inpatient Hospital Stay: Payer: Medicare Other | Attending: Oncology | Admitting: Oncology

## 2021-02-11 VITALS — BP 126/57 | HR 89 | Temp 98.7°F | Resp 18 | Ht 64.0 in | Wt 169.0 lb

## 2021-02-11 DIAGNOSIS — M3119 Other thrombotic microangiopathy: Secondary | ICD-10-CM

## 2021-02-11 DIAGNOSIS — D649 Anemia, unspecified: Secondary | ICD-10-CM | POA: Insufficient documentation

## 2021-02-11 DIAGNOSIS — G8929 Other chronic pain: Secondary | ICD-10-CM | POA: Diagnosis not present

## 2021-02-11 DIAGNOSIS — J449 Chronic obstructive pulmonary disease, unspecified: Secondary | ICD-10-CM | POA: Insufficient documentation

## 2021-02-11 DIAGNOSIS — Z79899 Other long term (current) drug therapy: Secondary | ICD-10-CM | POA: Insufficient documentation

## 2021-02-11 LAB — CBC WITH DIFFERENTIAL (CANCER CENTER ONLY)
Abs Immature Granulocytes: 0.03 10*3/uL (ref 0.00–0.07)
Basophils Absolute: 0.1 10*3/uL (ref 0.0–0.1)
Basophils Relative: 1 %
Eosinophils Absolute: 0.2 10*3/uL (ref 0.0–0.5)
Eosinophils Relative: 4 %
HCT: 49.8 % — ABNORMAL HIGH (ref 36.0–46.0)
Hemoglobin: 16 g/dL — ABNORMAL HIGH (ref 12.0–15.0)
Immature Granulocytes: 1 %
Lymphocytes Relative: 44 %
Lymphs Abs: 3 10*3/uL (ref 0.7–4.0)
MCH: 29.1 pg (ref 26.0–34.0)
MCHC: 32.1 g/dL (ref 30.0–36.0)
MCV: 90.7 fL (ref 80.0–100.0)
Monocytes Absolute: 0.6 10*3/uL (ref 0.1–1.0)
Monocytes Relative: 9 %
Neutro Abs: 2.7 10*3/uL (ref 1.7–7.7)
Neutrophils Relative %: 41 %
Platelet Count: 145 10*3/uL — ABNORMAL LOW (ref 150–400)
RBC: 5.49 MIL/uL — ABNORMAL HIGH (ref 3.87–5.11)
RDW: 17.8 % — ABNORMAL HIGH (ref 11.5–15.5)
WBC Count: 6.5 10*3/uL (ref 4.0–10.5)
nRBC: 0 % (ref 0.0–0.2)

## 2021-02-11 LAB — CMP (CANCER CENTER ONLY)
ALT: 15 U/L (ref 0–44)
AST: 20 U/L (ref 15–41)
Albumin: 4.1 g/dL (ref 3.5–5.0)
Alkaline Phosphatase: 38 U/L (ref 38–126)
Anion gap: 7 (ref 5–15)
BUN: 26 mg/dL — ABNORMAL HIGH (ref 8–23)
CO2: 29 mmol/L (ref 22–32)
Calcium: 9.8 mg/dL (ref 8.9–10.3)
Chloride: 106 mmol/L (ref 98–111)
Creatinine: 1.15 mg/dL — ABNORMAL HIGH (ref 0.44–1.00)
GFR, Estimated: 49 mL/min — ABNORMAL LOW (ref >60)
Glucose, Bld: 105 mg/dL — ABNORMAL HIGH (ref 70–99)
Potassium: 4.5 mmol/L (ref 3.5–5.1)
Sodium: 142 mmol/L (ref 135–145)
Total Bilirubin: 0.7 mg/dL (ref 0.3–1.2)
Total Protein: 6.7 g/dL (ref 6.5–8.1)

## 2021-02-11 LAB — LACTATE DEHYDROGENASE: LDH: 206 U/L — ABNORMAL HIGH (ref 98–192)

## 2021-02-11 MED ORDER — PREDNISONE 5 MG PO TABS: 5.0000 mg | ORAL_TABLET | Freq: Every day | ORAL | 3 refills | Status: DC

## 2021-02-11 MED ORDER — AZATHIOPRINE 50 MG PO TABS: 100.0000 mg | ORAL_TABLET | Freq: Every day | ORAL | 0 refills | Status: DC

## 2021-02-11 NOTE — Progress Notes (Signed)
[place] OFFICE PROGRESS NOTE   Diagnosis: TTP  INTERVAL HISTORY:   [name] returns as scheduled.  She feels well.  She continues Imuran and prednisone.  No new complaint.  Objective:  Vital signs in last 24 hours:  Blood pressure (!) 126/57, pulse 89, temperature 98.7 F (37.1 C), temperature source Oral, resp. rate 18, height 5\' 4"  (1.626 m), weight 169 lb (76.7 kg), SpO2 95 %.    HEENT: Mild coat over the tongue, no buccal thrush Resp: Lungs clear bilaterally Cardio: Regular rate and rhythm GI: No hepatosplenomegaly Vascular: No leg edema   Lab Results:  Lab Results  Component Value Date   WBC 6.5 02/11/2021   HGB 16.0 (H) 02/11/2021   HCT 49.8 (H) 02/11/2021   MCV 90.7 02/11/2021   PLT 145 (L) 02/11/2021   NEUTROABS 2.7 02/11/2021    CMP  Lab Results  Component Value Date   NA 142 02/11/2021   K 4.5 02/11/2021   CL 106 02/11/2021   CO2 29 02/11/2021   GLUCOSE 105 (H) 02/11/2021   BUN 26 (H) 02/11/2021   CREATININE 1.15 (H) 02/11/2021   CALCIUM 9.8 02/11/2021   PROT 6.7 02/11/2021   ALBUMIN 4.1 02/11/2021   AST 20 02/11/2021   ALT 15 02/11/2021   ALKPHOS 38 02/11/2021   BILITOT 0.7 02/11/2021   GFRNONAA 49 (L) 02/11/2021   GFRAA 50 (L) 11/25/2019    Medications: I have reviewed the patient's current medications.   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-plasma exchange, steroids, rituximab Relapse of TTP March 2015-plasma exchange, steroids, rituximab, and maintenance azathioprine Admission with severe thrombocytopenia and elevated LDH 07/16/2020, ADAMTS13 activity less than 2%, consistent with relapse of TTP       -Steroids/FFP given 07/16/2020       -Daily plasma exchange beginning 07/17/2020, 7 days, then qod starting 3/24, discharge 07/21/2020 on prednisone 60 mg daily       -Prednisone taper to 40 mg daily 07/29/2020        -rituximab 07/30/2020, 08/06/2020, 08/13/2020, 08/20/2020       -plasmapheresis on 07/31/2020, 08/03/2020, 08/05/2020, and 08/07/2020       -prednisone increased to 60 mg daily 08/17/2020       -Plasmapheresis 08/24/2020, 08/25/2020, 08/26/2020, 08/27/2020, 08/28/2020, 08/29/2020, 08/31/2020, 09/02/2020, 09/04/2020       -caplacizumab initiated 08/24/2020       -Decrease prednisone to 40 mg daily 08/28/2020       -ADAMTS13 37% on 09/04/2020       -Prednisone decreased to 30 mg daily 09/07/2020       -Continue caplacizumab       -prednisone decreased to 20 mg daily beginning 09/18/2020       -Cablivi discontinued 09/21/2020       -prednisone discontinued 09/24/2020, resumed at a dose of 20 mg daily on 10/01/2020       -prednisone taper to 10 mg daily 10/23/2020       -Prednisone taper to 5 mg daily 11/06/2020       -prednisone continued at a dose of 5 mg daily, Imuran resumed at 50 mg daily 12/03/2020 History of epidural abscess requiring laminectomy 2012 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 Cristina Gong-  no active bleeding or blood in the stomach, nor any prospective bleeding site, seen on exam.    Disposition:  [name] appears stable.  The platelet count is in the low normal range.  She will continue Imuran and prednisone.  The ADAMTS13 level remains low when she was here last month.  We will repeat the ADAMTS13 activity when she is here next month.    [place]rystal Coder, MD  02/11/2021  11:33 AM

## 2021-02-11 NOTE — Telephone Encounter (Signed)
error 

## 2021-03-12 ENCOUNTER — Encounter: Payer: Self-pay | Admitting: Nurse Practitioner

## 2021-03-12 ENCOUNTER — Inpatient Hospital Stay: Payer: Medicare Other | Attending: Oncology

## 2021-03-12 ENCOUNTER — Other Ambulatory Visit: Payer: Self-pay

## 2021-03-12 ENCOUNTER — Inpatient Hospital Stay (HOSPITAL_BASED_OUTPATIENT_CLINIC_OR_DEPARTMENT_OTHER): Payer: Medicare Other | Admitting: Nurse Practitioner

## 2021-03-12 VITALS — BP 155/84 | HR 84 | Temp 98.1°F | Resp 20 | Ht 64.0 in | Wt 170.4 lb

## 2021-03-12 DIAGNOSIS — G8929 Other chronic pain: Secondary | ICD-10-CM | POA: Insufficient documentation

## 2021-03-12 DIAGNOSIS — M3119 Other thrombotic microangiopathy: Secondary | ICD-10-CM

## 2021-03-12 DIAGNOSIS — Z79899 Other long term (current) drug therapy: Secondary | ICD-10-CM | POA: Insufficient documentation

## 2021-03-12 DIAGNOSIS — D649 Anemia, unspecified: Secondary | ICD-10-CM | POA: Diagnosis not present

## 2021-03-12 DIAGNOSIS — J449 Chronic obstructive pulmonary disease, unspecified: Secondary | ICD-10-CM | POA: Diagnosis not present

## 2021-03-12 LAB — LACTATE DEHYDROGENASE: LDH: 218 U/L — ABNORMAL HIGH (ref 98–192)

## 2021-03-12 LAB — CMP (CANCER CENTER ONLY)
ALT: 12 U/L (ref 0–44)
AST: 19 U/L (ref 15–41)
Albumin: 4.2 g/dL (ref 3.5–5.0)
Alkaline Phosphatase: 43 U/L (ref 38–126)
Anion gap: 6 (ref 5–15)
BUN: 27 mg/dL — ABNORMAL HIGH (ref 8–23)
CO2: 28 mmol/L (ref 22–32)
Calcium: 9.9 mg/dL (ref 8.9–10.3)
Chloride: 107 mmol/L (ref 98–111)
Creatinine: 1.15 mg/dL — ABNORMAL HIGH (ref 0.44–1.00)
GFR, Estimated: 49 mL/min — ABNORMAL LOW (ref >60)
Glucose, Bld: 105 mg/dL — ABNORMAL HIGH (ref 70–99)
Potassium: 4.7 mmol/L (ref 3.5–5.1)
Sodium: 141 mmol/L (ref 135–145)
Total Bilirubin: 0.6 mg/dL (ref 0.3–1.2)
Total Protein: 6.7 g/dL (ref 6.5–8.1)

## 2021-03-12 LAB — CBC WITH DIFFERENTIAL (CANCER CENTER ONLY)
Abs Immature Granulocytes: 0.05 10*3/uL (ref 0.00–0.07)
Basophils Absolute: 0.1 10*3/uL (ref 0.0–0.1)
Basophils Relative: 1 %
Eosinophils Absolute: 0.3 10*3/uL (ref 0.0–0.5)
Eosinophils Relative: 4 %
HCT: 48.7 % — ABNORMAL HIGH (ref 36.0–46.0)
Hemoglobin: 15.9 g/dL — ABNORMAL HIGH (ref 12.0–15.0)
Immature Granulocytes: 1 %
Lymphocytes Relative: 30 %
Lymphs Abs: 2 10*3/uL (ref 0.7–4.0)
MCH: 29.8 pg (ref 26.0–34.0)
MCHC: 32.6 g/dL (ref 30.0–36.0)
MCV: 91.4 fL (ref 80.0–100.0)
Monocytes Absolute: 0.6 10*3/uL (ref 0.1–1.0)
Monocytes Relative: 9 %
Neutro Abs: 3.7 10*3/uL (ref 1.7–7.7)
Neutrophils Relative %: 55 %
Platelet Count: 114 10*3/uL — ABNORMAL LOW (ref 150–400)
RBC: 5.33 MIL/uL — ABNORMAL HIGH (ref 3.87–5.11)
RDW: 18.1 % — ABNORMAL HIGH (ref 11.5–15.5)
WBC Count: 6.6 10*3/uL (ref 4.0–10.5)
nRBC: 0 % (ref 0.0–0.2)

## 2021-03-12 MED ORDER — AZATHIOPRINE 50 MG PO TABS: 100.0000 mg | ORAL_TABLET | Freq: Every day | ORAL | 0 refills | Status: DC

## 2021-03-12 NOTE — Progress Notes (Signed)
[place] OFFICE PROGRESS NOTE   Diagnosis: TTP  INTERVAL HISTORY:   [name] returns as scheduled.  She continues Imuran and prednisone.  She feels well.  She denies bleeding.  We discussed the bruises on her right forearm.  She attributes this to working in her yard.  No unusual headaches.  No visual disturbance.  No change in baseline balance issues.  Objective:  Vital signs in last 24 hours:  Blood pressure (!) 155/84, pulse 84, temperature 98.1 F (36.7 C), temperature source Oral, resp. rate 20, height 5\' 4"  (1.626 m), weight 170 lb 6.4 oz (77.3 kg), SpO2 95 %.    HEENT: No thrush or ulcers. Resp: Lungs clear bilaterally. Cardio: Regular rate and rhythm. GI: Abdomen soft and nontender.  No hepatosplenomegaly. Vascular: No leg edema. Skin: A few ecchymoses on the right forearm.   Lab Results:  Lab Results  Component Value Date   WBC 6.6 03/12/2021   HGB 15.9 (H) 03/12/2021   HCT 48.7 (H) 03/12/2021   MCV 91.4 03/12/2021   PLT 114 (L) 03/12/2021   NEUTROABS 3.7 03/12/2021    Imaging:  No results found.  Medications: I have reviewed the patient's current medications.  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-plasma exchange, steroids, rituximab Relapse of TTP March 2015-plasma exchange, steroids, rituximab, and maintenance azathioprine Admission with severe thrombocytopenia and elevated LDH 07/16/2020, ADAMTS13 activity less than 2%, consistent with relapse of TTP       -Steroids/FFP given 07/16/2020       -Daily plasma exchange beginning 07/17/2020, 7 days, then qod starting 3/24, discharge 07/21/2020 on prednisone 60 mg daily       -Prednisone taper to 40 mg daily 07/29/2020       -rituximab 07/30/2020, 08/06/2020, 08/13/2020, 08/20/2020       -plasmapheresis on 07/31/2020, 08/03/2020, 08/05/2020, and 08/07/2020       -prednisone increased to 60 mg daily  08/17/2020       -Plasmapheresis 08/24/2020, 08/25/2020, 08/26/2020, 08/27/2020, 08/28/2020, 08/29/2020, 08/31/2020, 09/02/2020, 09/04/2020       -caplacizumab initiated 08/24/2020       -Decrease prednisone to 40 mg daily 08/28/2020       -ADAMTS13 37% on 09/04/2020       -Prednisone decreased to 30 mg daily 09/07/2020       -Continue caplacizumab       -prednisone decreased to 20 mg daily beginning 09/18/2020       -Cablivi discontinued 09/21/2020       -prednisone discontinued 09/24/2020, resumed at a dose of 20 mg daily on 10/01/2020       -prednisone taper to 10 mg daily 10/23/2020       -Prednisone taper to 5 mg daily 11/06/2020       -prednisone continued at a dose of 5 mg daily, Imuran resumed at 50 mg daily 12/03/2020 History of epidural abscess requiring laminectomy 2012 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 Cristina Gong- no active bleeding or blood in the stomach, nor any prospective bleeding site, seen on exam.      Disposition: [name] remains stable from a hematologic standpoint.  She will continue Imuran and prednisone.  We will follow-up on the ADAMTS 13 level from  today.  She will return for lab and follow-up in approximately 4 weeks.  We are available to see her sooner if needed.    [name] ANP/GNP-BC   03/12/2021  10:57 AM

## 2021-03-15 ENCOUNTER — Telehealth: Payer: Self-pay

## 2021-03-15 LAB — ADAMTS13 ACTIVITY: Adamts 13 Activity: 3 % — CL (ref >66.8)

## 2021-03-15 LAB — ADAMTS13 ANTIBODY: ADAMTS13 Antibody: 7 Units/mL (ref <12)

## 2021-03-15 NOTE — Telephone Encounter (Addendum)
CRITICAL VALUE STICKER  CRITICAL VALUE: [place] 13  3.0  RECEIVER (on-site recipient of call): Lenox Ponds LPN  [place] NOTIFIED: 1220   MESSENGER (representative from lab):  MD NOTIFIED: Dr Benay Spice  TIME OF NOTIFICATION:1230  RESPONSE:

## 2021-04-05 ENCOUNTER — Telehealth: Payer: Self-pay | Admitting: Nurse Practitioner

## 2021-04-05 ENCOUNTER — Other Ambulatory Visit: Payer: Self-pay

## 2021-04-05 ENCOUNTER — Other Ambulatory Visit: Payer: Self-pay | Admitting: Nurse Practitioner

## 2021-04-05 ENCOUNTER — Inpatient Hospital Stay: Payer: Medicare Other | Attending: Oncology

## 2021-04-05 DIAGNOSIS — M3119 Other thrombotic microangiopathy: Secondary | ICD-10-CM | POA: Insufficient documentation

## 2021-04-05 DIAGNOSIS — R519 Headache, unspecified: Secondary | ICD-10-CM | POA: Insufficient documentation

## 2021-04-05 DIAGNOSIS — D649 Anemia, unspecified: Secondary | ICD-10-CM | POA: Insufficient documentation

## 2021-04-05 DIAGNOSIS — G8929 Other chronic pain: Secondary | ICD-10-CM | POA: Insufficient documentation

## 2021-04-05 DIAGNOSIS — J449 Chronic obstructive pulmonary disease, unspecified: Secondary | ICD-10-CM | POA: Diagnosis not present

## 2021-04-05 DIAGNOSIS — Z79899 Other long term (current) drug therapy: Secondary | ICD-10-CM | POA: Insufficient documentation

## 2021-04-05 LAB — URINALYSIS, COMPLETE (UACMP) WITH MICROSCOPIC
Bilirubin Urine: NEGATIVE
Glucose, UA: NEGATIVE mg/dL
Ketones, ur: NEGATIVE mg/dL
Leukocytes,Ua: NEGATIVE
Nitrite: NEGATIVE
Protein, ur: 30 mg/dL — AB
RBC / HPF: >50 RBC/hpf — ABNORMAL HIGH (ref 0–5)
Specific Gravity, Urine: 1.022 (ref 1.005–1.030)
pH: 7 (ref 5.0–8.0)

## 2021-04-05 LAB — CBC WITH DIFFERENTIAL (CANCER CENTER ONLY)
Abs Immature Granulocytes: 0.03 10*3/uL (ref 0.00–0.07)
Basophils Absolute: 0.1 10*3/uL (ref 0.0–0.1)
Basophils Relative: 1 %
Eosinophils Absolute: 0.2 10*3/uL (ref 0.0–0.5)
Eosinophils Relative: 3 %
HCT: 48.7 % — ABNORMAL HIGH (ref 36.0–46.0)
Hemoglobin: 16.2 g/dL — ABNORMAL HIGH (ref 12.0–15.0)
Immature Granulocytes: 1 %
Lymphocytes Relative: 35 %
Lymphs Abs: 2 10*3/uL (ref 0.7–4.0)
MCH: 31.8 pg (ref 26.0–34.0)
MCHC: 33.3 g/dL (ref 30.0–36.0)
MCV: 95.5 fL (ref 80.0–100.0)
Monocytes Absolute: 0.6 10*3/uL (ref 0.1–1.0)
Monocytes Relative: 11 %
Neutro Abs: 2.9 10*3/uL (ref 1.7–7.7)
Neutrophils Relative %: 49 %
Platelet Count: 106 10*3/uL — ABNORMAL LOW (ref 150–400)
RBC: 5.1 MIL/uL (ref 3.87–5.11)
RDW: 16 % — ABNORMAL HIGH (ref 11.5–15.5)
WBC Count: 5.8 10*3/uL (ref 4.0–10.5)
nRBC: 0 % (ref 0.0–0.2)

## 2021-04-05 LAB — CMP (CANCER CENTER ONLY)
ALT: 13 U/L (ref 0–44)
AST: 19 U/L (ref 15–41)
Albumin: 4.2 g/dL (ref 3.5–5.0)
Alkaline Phosphatase: 43 U/L (ref 38–126)
Anion gap: 7 (ref 5–15)
BUN: 22 mg/dL (ref 8–23)
CO2: 31 mmol/L (ref 22–32)
Calcium: 10.2 mg/dL (ref 8.9–10.3)
Chloride: 104 mmol/L (ref 98–111)
Creatinine: 1.22 mg/dL — ABNORMAL HIGH (ref 0.44–1.00)
GFR, Estimated: 45 mL/min — ABNORMAL LOW (ref >60)
Glucose, Bld: 148 mg/dL — ABNORMAL HIGH (ref 70–99)
Potassium: 4.7 mmol/L (ref 3.5–5.1)
Sodium: 142 mmol/L (ref 135–145)
Total Bilirubin: 0.5 mg/dL (ref 0.3–1.2)
Total Protein: 6.4 g/dL — ABNORMAL LOW (ref 6.5–8.1)

## 2021-04-05 LAB — LACTATE DEHYDROGENASE: LDH: 252 U/L — ABNORMAL HIGH (ref 98–192)

## 2021-04-05 NOTE — Telephone Encounter (Signed)
[name] daughter contacted the office this morning to report blood in [name]'s urine.  She came in for labs.  I reviewed the CBC, chemistry panel, LDH and urinalysis with [name] and her daughter.  No evidence of TTP relapse.  She does have significant blood in her urine.  She reports a history of kidney stones.  No definite symptoms to suggest a kidney stone and in general she is feeling well.  We will follow-up on the urine culture.  She has a return visit already scheduled later this week but understands to call us sooner if she develops any new symptoms.

## 2021-04-08 ENCOUNTER — Other Ambulatory Visit: Payer: Self-pay | Admitting: Nurse Practitioner

## 2021-04-08 ENCOUNTER — Inpatient Hospital Stay (HOSPITAL_BASED_OUTPATIENT_CLINIC_OR_DEPARTMENT_OTHER): Payer: Medicare Other | Admitting: Oncology

## 2021-04-08 ENCOUNTER — Encounter: Payer: Self-pay | Admitting: Oncology

## 2021-04-08 ENCOUNTER — Other Ambulatory Visit: Payer: Self-pay

## 2021-04-08 ENCOUNTER — Inpatient Hospital Stay: Payer: Medicare Other

## 2021-04-08 ENCOUNTER — Other Ambulatory Visit (HOSPITAL_BASED_OUTPATIENT_CLINIC_OR_DEPARTMENT_OTHER): Payer: Self-pay

## 2021-04-08 ENCOUNTER — Ambulatory Visit: Payer: Medicare Other | Attending: Internal Medicine

## 2021-04-08 VITALS — BP 134/56 | HR 86 | Temp 97.8°F | Resp 18 | Ht 64.0 in | Wt 170.6 lb

## 2021-04-08 DIAGNOSIS — Z23 Encounter for immunization: Secondary | ICD-10-CM

## 2021-04-08 DIAGNOSIS — M3119 Other thrombotic microangiopathy: Secondary | ICD-10-CM

## 2021-04-08 LAB — URINALYSIS, COMPLETE (UACMP) WITH MICROSCOPIC
Bilirubin Urine: NEGATIVE
Glucose, UA: NEGATIVE mg/dL
Ketones, ur: NEGATIVE mg/dL
Nitrite: NEGATIVE
Specific Gravity, Urine: 1.02 (ref 1.005–1.030)
pH: 6 (ref 5.0–8.0)

## 2021-04-08 LAB — CBC WITH DIFFERENTIAL (CANCER CENTER ONLY)
Abs Immature Granulocytes: 0.04 10*3/uL (ref 0.00–0.07)
Basophils Absolute: 0.1 10*3/uL (ref 0.0–0.1)
Basophils Relative: 1 %
Eosinophils Absolute: 0.2 10*3/uL (ref 0.0–0.5)
Eosinophils Relative: 2 %
HCT: 48.9 % — ABNORMAL HIGH (ref 36.0–46.0)
Hemoglobin: 16.1 g/dL — ABNORMAL HIGH (ref 12.0–15.0)
Immature Granulocytes: 1 %
Lymphocytes Relative: 35 %
Lymphs Abs: 2.6 10*3/uL (ref 0.7–4.0)
MCH: 31.4 pg (ref 26.0–34.0)
MCHC: 32.9 g/dL (ref 30.0–36.0)
MCV: 95.5 fL (ref 80.0–100.0)
Monocytes Absolute: 0.8 10*3/uL (ref 0.1–1.0)
Monocytes Relative: 11 %
Neutro Abs: 3.8 10*3/uL (ref 1.7–7.7)
Neutrophils Relative %: 50 %
Platelet Count: 96 10*3/uL — ABNORMAL LOW (ref 150–400)
RBC: 5.12 MIL/uL — ABNORMAL HIGH (ref 3.87–5.11)
RDW: 15.8 % — ABNORMAL HIGH (ref 11.5–15.5)
WBC Count: 7.3 10*3/uL (ref 4.0–10.5)
nRBC: 0 % (ref 0.0–0.2)

## 2021-04-08 LAB — CMP (CANCER CENTER ONLY)
ALT: 14 U/L (ref 0–44)
AST: 20 U/L (ref 15–41)
Albumin: 4.2 g/dL (ref 3.5–5.0)
Alkaline Phosphatase: 41 U/L (ref 38–126)
Anion gap: 5 (ref 5–15)
BUN: 25 mg/dL — ABNORMAL HIGH (ref 8–23)
CO2: 30 mmol/L (ref 22–32)
Calcium: 10.1 mg/dL (ref 8.9–10.3)
Chloride: 105 mmol/L (ref 98–111)
Creatinine: 1.3 mg/dL — ABNORMAL HIGH (ref 0.44–1.00)
GFR, Estimated: 42 mL/min — ABNORMAL LOW (ref >60)
Glucose, Bld: 100 mg/dL — ABNORMAL HIGH (ref 70–99)
Potassium: 5 mmol/L (ref 3.5–5.1)
Sodium: 140 mmol/L (ref 135–145)
Total Bilirubin: 0.6 mg/dL (ref 0.3–1.2)
Total Protein: 6.7 g/dL (ref 6.5–8.1)

## 2021-04-08 LAB — LACTATE DEHYDROGENASE: LDH: 267 U/L — ABNORMAL HIGH (ref 98–192)

## 2021-04-08 MED ORDER — PFIZER COVID-19 VAC BIVALENT 30 MCG/0.3ML IM SUSP
INTRAMUSCULAR | 0 refills | Status: DC
  Filled 2021-04-08: qty 0.3, 1d supply, fill #0

## 2021-04-08 NOTE — Progress Notes (Signed)
[place] OFFICE PROGRESS NOTE   Diagnosis: Olson  INTERVAL HISTORY:   [name] returns as scheduled.  She reports a mild headache today.  She had an episode of hematuria earlier this week.  Urinalysis revealed red cells, but no white cells and rare bacteria.  No recurrent hematuria.  Objective:  Vital signs in last 24 hours:  Blood pressure (!) 134/56, pulse 86, temperature 97.8 F (36.6 C), temperature source Oral, resp. rate 18, height 5\' 4"  (1.626 m), weight 170 lb 9.6 oz (77.4 kg), SpO2 98 %.    HEENT: No thrush or ulcers Resp: Distant breath sounds, no respiratory distress Cardio: Regular rate and rhythm GI: No hepatosplenomegaly Vascular: No leg edema  Lab Results:  Lab Results  Component Value Date   WBC 7.3 04/08/2021   HGB 16.1 (H) 04/08/2021   HCT 48.9 (H) 04/08/2021   MCV 95.5 04/08/2021   PLT 96 (L) 04/08/2021   NEUTROABS 3.8 04/08/2021    CMP  Lab Results  Component Value Date   NA 140 04/08/2021   K 5.0 04/08/2021   CL 105 04/08/2021   CO2 30 04/08/2021   GLUCOSE 100 (H) 04/08/2021   BUN 25 (H) 04/08/2021   CREATININE 1.30 (H) 04/08/2021   CALCIUM 10.1 04/08/2021   PROT 6.7 04/08/2021   ALBUMIN 4.2 04/08/2021   AST 20 04/08/2021   ALT 14 04/08/2021   ALKPHOS 41 04/08/2021   BILITOT 0.6 04/08/2021   GFRNONAA 42 (L) 04/08/2021   GFRAA 50 (L) 11/25/2019    Medications: I have reviewed the patient's current medications.   Assessment/Plan: Olson diagnosed in 2005, treated with plasma exchange, steroids, and rituximab consolidation Relapse of Olson October 2011-plasma exchange, steroids, rituximab Relapse of Olson March 2015-plasma exchange, steroids, rituximab, and maintenance azathioprine Admission with severe thrombocytopenia and elevated LDH 07/16/2020, ADAMTS13 activity less than 2%, consistent with relapse of Olson       -Steroids/FFP given 07/16/2020       -Daily plasma exchange beginning 07/17/2020, 7 days, then qod starting  3/24, discharge 07/21/2020 on prednisone 60 mg daily       -Prednisone taper to 40 mg daily 07/29/2020       -rituximab 07/30/2020, 08/06/2020, 08/13/2020, 08/20/2020       -plasmapheresis on 07/31/2020, 08/03/2020, 08/05/2020, and 08/07/2020       -prednisone increased to 60 mg daily 08/17/2020       -Plasmapheresis 08/24/2020, 08/25/2020, 08/26/2020, 08/27/2020, 08/28/2020, 08/29/2020, 08/31/2020, 09/02/2020, 09/04/2020       -caplacizumab initiated 08/24/2020       -Decrease prednisone to 40 mg daily 08/28/2020       -ADAMTS13 37% on 09/04/2020       -Prednisone decreased to 30 mg daily 09/07/2020       -Continue caplacizumab       -prednisone decreased to 20 mg daily beginning 09/18/2020       -Cablivi discontinued 09/21/2020       -prednisone discontinued 09/24/2020, resumed at a dose of 20 mg daily on 10/01/2020       -prednisone taper to 10 mg daily 10/23/2020       -Prednisone taper to 5 mg daily 11/06/2020       -prednisone continued at a dose of 5 mg daily, Imuran resumed at 50 mg daily 12/03/2020 History of epidural abscess requiring laminectomy 2012 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 Cristina Gong- no active bleeding or blood in the stomach, nor any prospective bleeding site, seen on exam.       Disposition: [name] appears unchanged.  She had an episode of hematuria earlier this week.  We will repeat a urinalysis and culture today. She continues Imuran and prednisone.  The platelet count is slightly lower today.  She will continue the current medical regimen.  She will return for an office lab visit in 3-4 weeks.  She will call in the interim for new symptoms.  Betsy Coder, MD  04/08/2021  12:08 PM

## 2021-04-08 NOTE — Progress Notes (Signed)
   Covid-19 Vaccination Clinic  Name:  [name]    MRN: [id number] DOB: 04-13-1941  04/08/2021  Ms. Trew was observed post Covid-19 immunization for 15 minutes without incident. She was provided with Vaccine Information Sheet and instruction to access the V-Safe system.   Ms. Mckain was instructed to call 911 with any severe reactions post vaccine: Difficulty breathing  Swelling of face and throat  A fast heartbeat  A bad rash all over body  Dizziness and weakness   Immunizations Administered     Name Date Dose VIS Date Route   Pfizer Covid-19 Vaccine Bivalent Booster 04/08/2021 12:24 PM 0.3 mL 12/30/2020 Intramuscular   Manufacturer: [place]   Lot: [id number]   [place]: [phone]

## 2021-04-09 LAB — URINE CULTURE: Culture: <10000 — AB

## 2021-04-13 ENCOUNTER — Telehealth: Payer: Self-pay

## 2021-04-13 ENCOUNTER — Other Ambulatory Visit: Payer: Self-pay | Admitting: *Deleted

## 2021-04-13 DIAGNOSIS — M3119 Other thrombotic microangiopathy: Secondary | ICD-10-CM

## 2021-04-13 DIAGNOSIS — R319 Hematuria, unspecified: Secondary | ICD-10-CM

## 2021-04-13 NOTE — Telephone Encounter (Signed)
-----   Message -----  From: Ladell Pier, MD  Sent: 04/08/2021   6:26 PM EST  To: [facility]   Please call patient, still has blood in urine, needs to see Urology to evaluate, please refer to urology     TC the pt to let her know she still have blood in her urine, and she need to see Urology to evaluate  PT verbalizing understanding

## 2021-04-13 NOTE — Progress Notes (Signed)
Referral order placed to [facility] per Dr. Benay Spice for hematuria. Dr. Gearldine Shown nurse to reach out to patient with reason for referral. Faxed referral order, demograhics, and chart information to [facility] at [phone].

## 2021-04-15 ENCOUNTER — Other Ambulatory Visit: Payer: Self-pay | Admitting: Nurse Practitioner

## 2021-04-15 ENCOUNTER — Encounter: Payer: Self-pay | Admitting: Nurse Practitioner

## 2021-04-15 ENCOUNTER — Other Ambulatory Visit: Payer: Self-pay | Admitting: *Deleted

## 2021-04-15 DIAGNOSIS — R319 Hematuria, unspecified: Secondary | ICD-10-CM

## 2021-04-15 DIAGNOSIS — M3119 Other thrombotic microangiopathy: Secondary | ICD-10-CM

## 2021-04-15 NOTE — Progress Notes (Signed)
Mr. Kozub sent MyChart message requesting to have urology referral changed from [facility] to Dr. Nickolas Madrid in [place] area to avoid travel.  Referral order, demographics and chart information faxed to [phone].  [facility] and cancelled referral sent yesterday.

## 2021-04-16 ENCOUNTER — Other Ambulatory Visit: Payer: Self-pay

## 2021-04-16 ENCOUNTER — Telehealth: Payer: Self-pay

## 2021-04-16 DIAGNOSIS — M3119 Other thrombotic microangiopathy: Secondary | ICD-10-CM

## 2021-04-16 NOTE — Telephone Encounter (Signed)
-----   Message from [name], NP sent at 04/15/2021  4:36 PM EST ----- Please let her know Dr. Benay Spice would like her to have labs done next week.  I have put in orders for 04/22/2021.  If she is agreeable please ask Melissa or Mo to schedule.

## 2021-04-16 NOTE — Telephone Encounter (Signed)
Called and spoke to patient. She ask to be schedule at [facility] for labs.appointment is 04/20/21 at 11:15. Ciera L. [place] and Joshus G. [place] is aware of the patient also know to release her order under [name].

## 2021-04-19 ENCOUNTER — Other Ambulatory Visit: Payer: Self-pay | Admitting: *Deleted

## 2021-04-19 DIAGNOSIS — R319 Hematuria, unspecified: Secondary | ICD-10-CM

## 2021-04-19 DIAGNOSIS — M3119 Other thrombotic microangiopathy: Secondary | ICD-10-CM

## 2021-04-19 NOTE — Progress Notes (Signed)
Fax from [facility] requesting referral be re-entered to their practice. Order placed an [facility] notified.

## 2021-04-20 ENCOUNTER — Inpatient Hospital Stay: Payer: Medicare Other

## 2021-05-04 ENCOUNTER — Inpatient Hospital Stay (HOSPITAL_BASED_OUTPATIENT_CLINIC_OR_DEPARTMENT_OTHER): Payer: Medicare Other | Admitting: Oncology

## 2021-05-04 ENCOUNTER — Other Ambulatory Visit: Payer: Self-pay

## 2021-05-04 ENCOUNTER — Inpatient Hospital Stay: Payer: Medicare Other | Attending: Oncology

## 2021-05-04 VITALS — BP 150/60 | HR 96 | Temp 98.2°F | Resp 18 | Ht 64.0 in | Wt 172.0 lb

## 2021-05-04 DIAGNOSIS — G8929 Other chronic pain: Secondary | ICD-10-CM | POA: Diagnosis not present

## 2021-05-04 DIAGNOSIS — R195 Other fecal abnormalities: Secondary | ICD-10-CM | POA: Diagnosis not present

## 2021-05-04 DIAGNOSIS — M3119 Other thrombotic microangiopathy: Secondary | ICD-10-CM

## 2021-05-04 DIAGNOSIS — J449 Chronic obstructive pulmonary disease, unspecified: Secondary | ICD-10-CM | POA: Insufficient documentation

## 2021-05-04 DIAGNOSIS — D649 Anemia, unspecified: Secondary | ICD-10-CM | POA: Insufficient documentation

## 2021-05-04 DIAGNOSIS — Z79899 Other long term (current) drug therapy: Secondary | ICD-10-CM | POA: Diagnosis not present

## 2021-05-04 LAB — CBC WITH DIFFERENTIAL (CANCER CENTER ONLY)
Abs Immature Granulocytes: 0.02 10*3/uL (ref 0.00–0.07)
Basophils Absolute: 0.1 10*3/uL (ref 0.0–0.1)
Basophils Relative: 1 %
Eosinophils Absolute: 0.2 10*3/uL (ref 0.0–0.5)
Eosinophils Relative: 2 %
HCT: 51.4 % — ABNORMAL HIGH (ref 36.0–46.0)
Hemoglobin: 16.8 g/dL — ABNORMAL HIGH (ref 12.0–15.0)
Immature Granulocytes: 0 %
Lymphocytes Relative: 33 %
Lymphs Abs: 2.5 10*3/uL (ref 0.7–4.0)
MCH: 32.1 pg (ref 26.0–34.0)
MCHC: 32.7 g/dL (ref 30.0–36.0)
MCV: 98.1 fL (ref 80.0–100.0)
Monocytes Absolute: 0.6 10*3/uL (ref 0.1–1.0)
Monocytes Relative: 8 %
Neutro Abs: 4.1 10*3/uL (ref 1.7–7.7)
Neutrophils Relative %: 56 %
Platelet Count: 113 10*3/uL — ABNORMAL LOW (ref 150–400)
RBC: 5.24 MIL/uL — ABNORMAL HIGH (ref 3.87–5.11)
RDW: 14.3 % (ref 11.5–15.5)
WBC Count: 7.4 10*3/uL (ref 4.0–10.5)
nRBC: 0 % (ref 0.0–0.2)

## 2021-05-04 LAB — CMP (CANCER CENTER ONLY)
ALT: 15 U/L (ref 0–44)
AST: 34 U/L (ref 15–41)
Albumin: 4.1 g/dL (ref 3.5–5.0)
Alkaline Phosphatase: 35 U/L — ABNORMAL LOW (ref 38–126)
Anion gap: 5 (ref 5–15)
BUN: 26 mg/dL — ABNORMAL HIGH (ref 8–23)
CO2: 32 mmol/L (ref 22–32)
Calcium: 10 mg/dL (ref 8.9–10.3)
Chloride: 104 mmol/L (ref 98–111)
Creatinine: 1.16 mg/dL — ABNORMAL HIGH (ref 0.44–1.00)
GFR, Estimated: 48 mL/min — ABNORMAL LOW (ref >60)
Glucose, Bld: 110 mg/dL — ABNORMAL HIGH (ref 70–99)
Potassium: 5.2 mmol/L — ABNORMAL HIGH (ref 3.5–5.1)
Sodium: 141 mmol/L (ref 135–145)
Total Bilirubin: 0.5 mg/dL (ref 0.3–1.2)
Total Protein: 7 g/dL (ref 6.5–8.1)

## 2021-05-04 LAB — LACTATE DEHYDROGENASE: LDH: 417 U/L — ABNORMAL HIGH (ref 98–192)

## 2021-05-04 NOTE — Progress Notes (Signed)
[name] OFFICE PROGRESS NOTE   Diagnosis: TTP  INTERVAL HISTORY:    [name] returns as scheduled.  She feels well.  No bleeding or symptom of thrombosis.  No new complaint.  She continues Imuran.  Objective:  Vital signs in last 24 hours:  Blood pressure (!) 150/60, pulse 96, temperature 98.2 F (36.8 C), temperature source Oral, resp. rate 18, height 5\' 4"  (1.626 m), weight 172 lb (78 kg), SpO2 100 %.    HEENT: Mild white coat over the tongue Resp: Lungs clear bilaterally Cardio: Regular rate and rhythm GI: No hepatosplenomegaly Vascular: No leg edema  Lab Results:  Lab Results  Component Value Date   WBC 7.4 05/04/2021   HGB 16.8 (H) 05/04/2021   HCT 51.4 (H) 05/04/2021   MCV 98.1 05/04/2021   PLT 113 (L) 05/04/2021   NEUTROABS 4.1 05/04/2021    CMP  Lab Results  Component Value Date   NA 141 05/04/2021   K 5.2 (H) 05/04/2021   CL 104 05/04/2021   CO2 32 05/04/2021   GLUCOSE 110 (H) 05/04/2021   BUN 26 (H) 05/04/2021   CREATININE 1.16 (H) 05/04/2021   CALCIUM 10.0 05/04/2021   PROT 7.0 05/04/2021   ALBUMIN 4.1 05/04/2021   AST 34 05/04/2021   ALT 15 05/04/2021   ALKPHOS 35 (L) 05/04/2021   BILITOT 0.5 05/04/2021   GFRNONAA 48 (L) 05/04/2021   GFRAA 50 (L) 11/25/2019      Medications: I have reviewed the patient's current medications.   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-plasma exchange, steroids, rituximab Relapse of TTP March 2015-plasma exchange, steroids, rituximab, and maintenance azathioprine Admission with severe thrombocytopenia and elevated LDH 07/16/2020, ADAMTS13 activity less than 2%, consistent with relapse of TTP       -Steroids/FFP given 07/16/2020       -Daily plasma exchange beginning 07/17/2020, 7 days, then qod starting 3/24, discharge 07/21/2020 on prednisone 60 mg daily       -Prednisone taper to 40 mg daily  07/29/2020       -rituximab 07/30/2020, 08/06/2020, 08/13/2020, 08/20/2020       -plasmapheresis on 07/31/2020, 08/03/2020, 08/05/2020, and 08/07/2020       -prednisone increased to 60 mg daily 08/17/2020       -Plasmapheresis 08/24/2020, 08/25/2020, 08/26/2020, 08/27/2020, 08/28/2020, 08/29/2020, 08/31/2020, 09/02/2020, 09/04/2020       -caplacizumab initiated 08/24/2020       -Decrease prednisone to 40 mg daily 08/28/2020       -ADAMTS13 37% on 09/04/2020       -Prednisone decreased to 30 mg daily 09/07/2020       -Continue caplacizumab       -prednisone decreased to 20 mg daily beginning 09/18/2020       -Cablivi discontinued 09/21/2020       -prednisone discontinued 09/24/2020, resumed at a dose of 20 mg daily on 10/01/2020       -prednisone taper to 10 mg daily 10/23/2020       -Prednisone taper to 5 mg daily 11/06/2020       -prednisone continued at a dose of 5 mg daily, Imuran resumed at 50 mg daily 12/03/2020 History of epidural abscess requiring laminectomy 2012 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 Cristina Gong- no active bleeding or blood in the stomach, nor any prospective bleeding site, seen on exam.       Disposition:  [name] appears stable.  The hemoglobin is normal and the platelet count remains mildly decreased.  The Adamts activity has remained low and the LDH is elevated, but she has not developed anemia or progressive thrombocytopenia.  The plan is to continue Imuran and prednisone at the current doses.  She will return for an office and lab visit in 4 weeks.  We will follow-up on the Adamts activity from today.  Betsy Coder, MD  05/04/2021  12:32 PM

## 2021-05-07 ENCOUNTER — Telehealth: Payer: Self-pay

## 2021-05-07 NOTE — Telephone Encounter (Signed)
CRITICAL VALUE STICKER  CRITICAL VALUE: ADAMS-13  RECEIVER (on-site recipient of call): Lenox Ponds LPN  DATE & TIME NOTIFIED: <2.0  MESSENGER (representative from lab):E Angela Adam  MD NOTIFIED: Dr. Benay Spice   TIME OF NOTIFICATION: 11:40  RESPONSE:  Provider Aware

## 2021-05-11 ENCOUNTER — Other Ambulatory Visit: Payer: Self-pay | Admitting: *Deleted

## 2021-05-11 ENCOUNTER — Ambulatory Visit (INDEPENDENT_AMBULATORY_CARE_PROVIDER_SITE_OTHER): Payer: Medicare Other | Admitting: Urology

## 2021-05-11 ENCOUNTER — Encounter: Payer: Self-pay | Admitting: Urology

## 2021-05-11 ENCOUNTER — Other Ambulatory Visit
Admission: RE | Admit: 2021-05-11 | Discharge: 2021-05-11 | Disposition: A | Discharge status: Home / Self Care | Payer: Medicare Other | Attending: Urology | Admitting: Urology

## 2021-05-11 ENCOUNTER — Other Ambulatory Visit: Payer: Self-pay

## 2021-05-11 VITALS — BP 116/72 | HR 93 | Ht 65.0 in | Wt 172.0 lb

## 2021-05-11 DIAGNOSIS — R310 Gross hematuria: Secondary | ICD-10-CM

## 2021-05-11 LAB — URINALYSIS, COMPLETE (UACMP) WITH MICROSCOPIC
Bilirubin Urine: NEGATIVE
Glucose, UA: NEGATIVE mg/dL
Hgb urine dipstick: NEGATIVE
Nitrite: NEGATIVE
Protein, ur: NEGATIVE mg/dL
Specific Gravity, Urine: 1.02 (ref 1.005–1.030)
pH: 6.5 (ref 5.0–8.0)

## 2021-05-11 LAB — ADAMTS13 ACTIVITY: Adamts 13 Activity: <2 % — CL (ref >66.8)

## 2021-05-11 LAB — ADAMTS13 ANTIBODY: ADAMTS13 Antibody: 14 Units/mL — ABNORMAL HIGH (ref <12)

## 2021-05-11 NOTE — Progress Notes (Signed)
05/11/21 11:22 AM   [name] 06-24-1940 [id number]  CC: Gross hematuria  HPI: 81 year old female with 120+ pack year smoking history who reports a single episode of gross hematuria 6 weeks ago.  This resolved spontaneously.  She noticed some pink urine for 1 to 2 days afterwards, but urine has been clear since that time.  She denies any dysuria or pain at that time.  Urinalysis checked with PCP showed microscopic hematuria with greater than 50 RBCs, and culture was negative.  Repeat urine a few days later showed persistent microscopic hematuria with 20-50 RBCs.  She had a CT abdomen and pelvis without contrast in May 2022 that showed no urologic abnormalities, aside from a small nonobstructing right renal stone.  Urinalysis today with 6-10 squamous cells, 6-10 WBCs, 0-5 RBCs, few bacteria, trace leukocytes.  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  PMH: Past Medical History:  Diagnosis Date   Abnormal laboratory test 09/10/2013   Persistent elevation LDH   Acute delirium    Adnexal cyst 09/25/2020   Anemia    Arthritis    Bacteremia 03/2011   Blood dyscrasia    Blood transfusion    Bronchitis, chronic obstructive w acute bronchitis (HCC) 08/12/2013   Chronic back pain    COPD (chronic obstructive pulmonary disease) (HCC)    Depression    "mild"   History of plasmapheresis 08/08/2013   Active for 3rd relapse of TTP   History of TTP (thrombotic thrombocytopenic purpura)    Hypokalemia 08/08/2013   Steroid related   Normal echocardiogram 03/15/11   Oral thrush 08/08/2013   Septicemia 03/2011   Spinal stenosis, lumbar    Tobacco abuse 05/10/2013    Surgical History: Past Surgical History:  Procedure Laterality Date   CATARACT EXTRACTION W/ INTRAOCULAR LENS  IMPLANT, BILATERAL  ~ 2010   CESAREAN SECTION  10/09/1976   ESOPHAGOGASTRODUODENOSCOPY (EGD) WITH PROPOFOL N/A 09/27/2020   Procedure: ESOPHAGOGASTRODUODENOSCOPY (EGD) WITH PROPOFOL;  Surgeon: Ronald Lobo, MD;   Location: [facility];  Service: Endoscopy;  Laterality: N/A;   EYE SURGERY     INSERTION OF DIALYSIS CATHETER Left 07/12/2013   Procedure: INSERTION OF DIALYSIS CATHETER   with Ultrasound;  Surgeon: Rosetta Posner, MD;  Location: [facility];  Service: Vascular;  Laterality: Left;   IR PERC TUN PERIT CATH WO PORT S&I /IMAG  07/16/2020   IR RADIOLOGIST EVAL & MGMT  09/26/2020   IR US GUIDE VASC ACCESS LEFT  07/16/2020   LUMBAR LAMINECTOMY/DECOMPRESSION MICRODISCECTOMY  03/16/2011   Procedure: LUMBAR LAMINECTOMY/DECOMPRESSION MICRODISCECTOMY;  Surgeon: Elaina Hoops;  Location: [facility];  Service: Neurosurgery;  Laterality: N/A;   TEE WITHOUT CARDIOVERSION  03/21/2011   Procedure: TRANSESOPHAGEAL ECHOCARDIOGRAM (TEE);  Surgeon: Laverda Page;  Location: [facility];  Service: Cardiovascular;  Laterality: N/A;  TEE for vegetations   TONSILLECTOMY     TUBAL LIGATION        Family History: Family History  Problem Relation Age of Onset   Heart disease Mother    Leukemia Sister    Breast cancer Neg Hx    Colon cancer Neg Hx    Ovarian cancer Neg Hx    Uterine cancer Neg Hx    Cervical cancer Neg Hx     Social History:  reports that she has been smoking cigarettes. She has a 165.00 pack-year smoking history. She has never used smokeless tobacco. She reports current alcohol use of about 7.0 standard drinks per week. She reports that she  does not use drugs.  Physical Exam: BP 116/72    Pulse 93    Ht 5\' 5"  (1.651 m)    Wt 172 lb (78 kg)    BMI 28.62 kg/m    Constitutional:  Alert and oriented, No acute distress. Cardiovascular: No clubbing, cyanosis, or edema. Respiratory: Normal respiratory effort, no increased work of breathing. GI: Abdomen is soft, nontender, nondistended, no abdominal masses  Laboratory Data: Reviewed, see HPI  Pertinent Imaging: I have personally reviewed and interpreted the CT from May 2022 showing no significant urologic abnormalities  Assessment & Plan:    81 year old female with 120-pack-year smoking history and episode of asymptomatic gross hematuria 6 weeks ago with multiple follow-up urinalysis showing microscopic hematuria but no evidence of infection.  We discussed common possible etiologies of hematuria including malignancy, urolithiasis, medical renal disease, and idiopathic. Standard workup recommended by the [facility]UA includes imaging with CT urogram to assess the upper tracts, and cystoscopy. Cytology is performed on patient's with gross hematuria to look for malignant cells in the urine.  CT urogram and cystoscopy to complete gross hematuria work-up   Nickolas Madrid, MD 05/11/2021  [facility] [address] [place], [place] [address] [phone]

## 2021-05-11 NOTE — Patient Instructions (Signed)

## 2021-05-20 ENCOUNTER — Ambulatory Visit: Payer: Medicare Other | Admitting: Oncology

## 2021-05-20 ENCOUNTER — Other Ambulatory Visit: Payer: Medicare Other

## 2021-05-26 ENCOUNTER — Other Ambulatory Visit: Payer: Self-pay

## 2021-05-26 DIAGNOSIS — Z862 Personal history of diseases of the blood and blood-forming organs and certain disorders involving the immune mechanism: Secondary | ICD-10-CM

## 2021-05-26 MED ORDER — PREDNISONE 5 MG PO TABS: 5.0000 mg | ORAL_TABLET | Freq: Every day | ORAL | 3 refills | Status: DC

## 2021-05-31 ENCOUNTER — Ambulatory Visit
Admission: RE | Admit: 2021-05-31 | Discharge: 2021-05-31 | Disposition: A | Discharge status: Home / Self Care | Payer: Medicare Other | Source: Ambulatory Visit | Attending: Urology | Admitting: Urology

## 2021-05-31 DIAGNOSIS — R310 Gross hematuria: Secondary | ICD-10-CM | POA: Insufficient documentation

## 2021-05-31 MED ORDER — IOHEXOL 300 MG/ML  SOLN
100.0000 mL | Freq: Once | INTRAMUSCULAR | Status: AC | PRN
  Administered 2021-05-31: 100 mL via INTRAVENOUS

## 2021-06-01 ENCOUNTER — Inpatient Hospital Stay: Payer: Medicare Other

## 2021-06-01 ENCOUNTER — Other Ambulatory Visit: Payer: Self-pay

## 2021-06-01 ENCOUNTER — Encounter: Payer: Self-pay | Admitting: Nurse Practitioner

## 2021-06-01 ENCOUNTER — Inpatient Hospital Stay (HOSPITAL_BASED_OUTPATIENT_CLINIC_OR_DEPARTMENT_OTHER): Payer: Medicare Other | Admitting: Nurse Practitioner

## 2021-06-01 VITALS — BP 118/62 | HR 83 | Temp 97.8°F | Resp 20 | Ht 65.0 in | Wt 174.8 lb

## 2021-06-01 DIAGNOSIS — M3119 Other thrombotic microangiopathy: Secondary | ICD-10-CM | POA: Diagnosis not present

## 2021-06-01 LAB — CBC WITH DIFFERENTIAL (CANCER CENTER ONLY)
Abs Immature Granulocytes: 0.03 10*3/uL (ref 0.00–0.07)
Basophils Absolute: 0.1 10*3/uL (ref 0.0–0.1)
Basophils Relative: 1 %
Eosinophils Absolute: 0.2 10*3/uL (ref 0.0–0.5)
Eosinophils Relative: 4 %
HCT: 49.9 % — ABNORMAL HIGH (ref 36.0–46.0)
Hemoglobin: 16.6 g/dL — ABNORMAL HIGH (ref 12.0–15.0)
Immature Granulocytes: 1 %
Lymphocytes Relative: 41 %
Lymphs Abs: 2.2 10*3/uL (ref 0.7–4.0)
MCH: 32 pg (ref 26.0–34.0)
MCHC: 33.3 g/dL (ref 30.0–36.0)
MCV: 96.3 fL (ref 80.0–100.0)
Monocytes Absolute: 0.6 10*3/uL (ref 0.1–1.0)
Monocytes Relative: 10 %
Neutro Abs: 2.3 10*3/uL (ref 1.7–7.7)
Neutrophils Relative %: 43 %
Platelet Count: 93 10*3/uL — ABNORMAL LOW (ref 150–400)
RBC: 5.18 MIL/uL — ABNORMAL HIGH (ref 3.87–5.11)
RDW: 13.6 % (ref 11.5–15.5)
WBC Count: 5.4 10*3/uL (ref 4.0–10.5)
nRBC: 0 % (ref 0.0–0.2)

## 2021-06-01 LAB — CMP (CANCER CENTER ONLY)
ALT: 13 U/L (ref 0–44)
AST: 19 U/L (ref 15–41)
Albumin: 4 g/dL (ref 3.5–5.0)
Alkaline Phosphatase: 37 U/L — ABNORMAL LOW (ref 38–126)
Anion gap: 6 (ref 5–15)
BUN: 26 mg/dL — ABNORMAL HIGH (ref 8–23)
CO2: 30 mmol/L (ref 22–32)
Calcium: 9.7 mg/dL (ref 8.9–10.3)
Chloride: 105 mmol/L (ref 98–111)
Creatinine: 1.31 mg/dL — ABNORMAL HIGH (ref 0.44–1.00)
GFR, Estimated: 42 mL/min — ABNORMAL LOW (ref >60)
Glucose, Bld: 108 mg/dL — ABNORMAL HIGH (ref 70–99)
Potassium: 4.8 mmol/L (ref 3.5–5.1)
Sodium: 141 mmol/L (ref 135–145)
Total Bilirubin: 0.6 mg/dL (ref 0.3–1.2)
Total Protein: 6.8 g/dL (ref 6.5–8.1)

## 2021-06-01 LAB — RETICULOCYTES
Immature Retic Fract: 11.4 % (ref 2.3–15.9)
RBC.: 5.18 MIL/uL — ABNORMAL HIGH (ref 3.87–5.11)
Retic Count, Absolute: 102 10*3/uL (ref 19.0–186.0)
Retic Ct Pct: 2 % (ref 0.4–3.1)

## 2021-06-01 LAB — LACTATE DEHYDROGENASE: LDH: 261 U/L — ABNORMAL HIGH (ref 98–192)

## 2021-06-01 NOTE — Progress Notes (Signed)
[name] OFFICE PROGRESS NOTE   Diagnosis: TTP  INTERVAL HISTORY:   [name] returns as scheduled.  No complaints.  She overall feels well.  No more episodes of hematuria.  She is following up with urology.  She has a good appetite.  No fevers or sweats.  No bleeding.  Baseline dyspnea unchanged.  Objective:  Vital signs in last 24 hours:  Blood pressure 118/62, pulse 83, temperature 97.8 F (36.6 C), temperature source Oral, resp. rate 20, height 5\' 5"  (1.651 m), weight 174 lb 12.8 oz (79.3 kg), SpO2 99 %.    HEENT: No thrush or ulcers. Resp: Lungs clear bilaterally. Cardio: Regular rate and rhythm. GI: Abdomen soft and nontender.  No hepatosplenomegaly. Vascular: No leg edema.  Lab Results:  Lab Results  Component Value Date   WBC 5.4 06/01/2021   HGB 16.6 (H) 06/01/2021   HCT 49.9 (H) 06/01/2021   MCV 96.3 06/01/2021   PLT 93 (L) 06/01/2021   NEUTROABS 2.3 06/01/2021    Imaging:  No results found.  Medications: I have reviewed the patient's current medications.  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-plasma exchange, steroids, rituximab Relapse of TTP March 2015-plasma exchange, steroids, rituximab, and maintenance azathioprine Admission with severe thrombocytopenia and elevated LDH 07/16/2020, ADAMTS13 activity less than 2%, consistent with relapse of TTP       -Steroids/FFP given 07/16/2020       -Daily plasma exchange beginning 07/17/2020, 7 days, then qod starting 3/24, discharge 07/21/2020 on prednisone 60 mg daily       -Prednisone taper to 40 mg daily 07/29/2020       -rituximab 07/30/2020, 08/06/2020, 08/13/2020, 08/20/2020       -plasmapheresis on 07/31/2020, 08/03/2020, 08/05/2020, and 08/07/2020       -prednisone increased to 60 mg daily 08/17/2020       -Plasmapheresis 08/24/2020, 08/25/2020, 08/26/2020, 08/27/2020, 08/28/2020, 08/29/2020, 08/31/2020, 09/02/2020,  09/04/2020       -caplacizumab initiated 08/24/2020       -Decrease prednisone to 40 mg daily 08/28/2020       -ADAMTS13 37% on 09/04/2020       -Prednisone decreased to 30 mg daily 09/07/2020       -Continue caplacizumab       -prednisone decreased to 20 mg daily beginning 09/18/2020       -Cablivi discontinued 09/21/2020       -prednisone discontinued 09/24/2020, resumed at a dose of 20 mg daily on 10/01/2020       -prednisone taper to 10 mg daily 10/23/2020       -Prednisone taper to 5 mg daily 11/06/2020       -prednisone continued at a dose of 5 mg daily, Imuran resumed at 50 mg daily 12/03/2020 History of epidural abscess requiring laminectomy 2012 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 Cristina Gong- no active bleeding or blood in the stomach, nor any prospective bleeding site, seen on exam.      Disposition: [name] remains stable from a hematologic standpoint.  Hemoglobin is in normal range.  Platelet count is stable.  LDH is lower.  We will follow-up on the AdamTS activity from today.  She will continue Imuran and prednisone at the current  doses.  She will return for lab and follow-up in approximately 4 weeks.  She will contact the office in the interim with any problems.    [name] ANP/GNP-BC   06/01/2021  2:23 PM

## 2021-06-04 LAB — ADAMTS13 ANTIBODY: ADAMTS13 Antibody: 11 Units/mL (ref <12)

## 2021-06-07 LAB — ADAMTS13 ACTIVITY: Adamts 13 Activity: 4.2 % (ref >66.8)

## 2021-06-09 ENCOUNTER — Other Ambulatory Visit: Payer: Self-pay | Admitting: Urology

## 2021-06-09 ENCOUNTER — Encounter: Payer: Self-pay | Admitting: Urology

## 2021-06-09 ENCOUNTER — Ambulatory Visit (INDEPENDENT_AMBULATORY_CARE_PROVIDER_SITE_OTHER): Payer: Medicare Other | Admitting: Urology

## 2021-06-09 ENCOUNTER — Ambulatory Visit: Payer: Medicare Other | Admitting: Urology

## 2021-06-09 ENCOUNTER — Other Ambulatory Visit: Payer: Self-pay

## 2021-06-09 VITALS — BP 146/75 | HR 84 | Ht 63.0 in | Wt 174.0 lb

## 2021-06-09 DIAGNOSIS — N201 Calculus of ureter: Secondary | ICD-10-CM

## 2021-06-09 DIAGNOSIS — D494 Neoplasm of unspecified behavior of bladder: Secondary | ICD-10-CM

## 2021-06-09 DIAGNOSIS — N200 Calculus of kidney: Secondary | ICD-10-CM | POA: Diagnosis not present

## 2021-06-09 NOTE — Progress Notes (Signed)
° °  06/09/2021 4:22 PM   [name] 06-30-1940 [id number]  Reason for visit: Gross hematuria, discussed CT results  HPI: 81 year old female who presented with gross hematuria, and microscopic hematuria on urinalysis.  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  I personally viewed and interpreted the CT scan that shows a 3 mm right distal ureteral stone without hydronephrosis, as well as concern for a 1 cm bladder tumor at the left base of the bladder.  We reviewed these results at length today, and I recommended scheduling cystoscopy, right ureteroscopy/laser/stent, and bladder biopsy/fulguration/gemcitabine.  We specifically discussed the risks ureteroscopy including bleeding, infection/sepsis, stent related symptoms including flank pain/urgency/frequency/incontinence/dysuria, ureteral injury, inability to access stone, or need for staged or additional procedures.  We discussed transurethral resection of bladder tumor (TURBT) and risks and benefits at length. This is typically a 1 to 2-hour procedure done under general anesthesia in the operating room.  A scope is inserted through the urethra and used to resect abnormal tissue within the bladder, which is then sent to the pathologist to determine grade and stage of the tumor.  Risks include bleeding, infection, need for temporary Foley placement, and bladder perforation.  Treatment strategies are based on the type of tumor and depth of invasion.  We briefly reviewed the different treatment pathways for non-muscle invasive and muscle invasive bladder cancer.  -Schedule right ureteroscopy, laser lithotripsy, stent placement, TURBT with gemcitabine -We will need clearance from oncology with her low platelets  Billey Co, MD  [facility] [address], [place], [place] [address] [phone]

## 2021-06-09 NOTE — Patient Instructions (Signed)
Transurethral Resection of Bladder Tumor Transurethral resection of a bladder tumor is the removal (resection) of a cancerous growth (tumor) on the inside wall of the bladder. The bladder is the organ that holds urine. The tumor is removed through the tube that carries urine out of the body (urethra). In a transurethral resection, a thin telescope with a light, a tiny camera, and an electric cutting edge (resectoscope) is passed through the urethra. In men, the opening of the urethra is at the end of the penis. In women, it is just above the opening of the vagina. Tell a health care provider about: Any allergies you have. All medicines you are taking, including vitamins, herbs, eye drops, creams, and over-the-counter medicines. Any problems you or family members have had with anesthetic medicines. Any blood disorders you have. Any surgeries you have had. Any medical conditions you have. Any recent urinary tract infections you have had. Whether you are pregnant or may be pregnant. What are the risks? Generally, this is a safe procedure. However, problems may occur, including: Infection. Bleeding. Allergic reactions to medicines. Damage to nearby structures or organs, such as: The urethra. The tubes that drain urine from the kidneys into the bladder (ureters). Pain and burning during urination. Difficulty urinating due to partial blockage of the urethra. Inability to urinate (urinary retention). What happens before the procedure? Staying hydrated Follow instructions from your health care provider about hydration, which may include: Up to 2 hours before the procedure - you may continue to drink clear liquids, such as water, clear fruit juice, black coffee, and plain tea.  Eating and drinking restrictions Follow instructions from your health care provider about eating and drinking, which may include: 8 hours before the procedure - stop eating heavy meals or foods, such as meat, fried foods,  or fatty foods. 6 hours before the procedure - stop eating light meals or foods, such as toast or cereal. 6 hours before the procedure - stop drinking milk or drinks that contain milk. 2 hours before the procedure - stop drinking clear liquids. Medicines Ask your health care provider about: Changing or stopping your regular medicines. This is especially important if you are taking diabetes medicines or blood thinners. Taking medicines such as aspirin and ibuprofen. These medicines can thin your blood. Do not take these medicines unless your health care provider tells you to take them. Taking over-the-counter medicines, vitamins, herbs, and supplements. Tests You may have exams or tests, including: Physical exam. Blood tests. Urine tests. Electrocardiogram (ECG). This test measures the electrical activity of the heart. General instructions Plan to have someone take you home from the hospital or clinic. Ask your health care provider how your surgical site will be marked or identified. Ask your health care provider what steps will be taken to help prevent infection. These may include: Washing skin with a germ-killing soap. Taking antibiotic medicine. What happens during the procedure? An IV will be inserted into one of your veins. You will be given one or more of the following: A medicine to help you relax (sedative). A medicine to make you fall asleep (general anesthetic). A medicine that is injected into your spine to numb the area below and slightly above the injection site (spinal anesthetic). Your legs will be placed in foot rests (stirrups) so that your legs are apart and your knees are bent. The resectoscope will be passed through your urethra and into your bladder. The part of your bladder that is affected by the tumor will be  resected using the cutting edge of the resectoscope. The resectoscope will be removed. A thin, flexible tube (catheter) will be passed through your urethra  and into your bladder. The catheter will drain urine into a bag outside of your body. Fluid may be passed through the catheter to keep the catheter open. The procedure may vary among health care providers and hospitals. What happens after the procedure? Your blood pressure, heart rate, breathing rate, and blood oxygen level will be monitored until you leave the hospital or clinic. You may continue to receive fluids and medicines through an IV. You will have some pain. You will be given pain medicine to relieve pain. You will have a catheter to drain your urine. You will have blood in your urine. Your catheter may be kept in until your urine is clear. The amount of urine will be monitored. If necessary, your bladder may be rinsed out (irrigated) by passing fluid through your catheter. You will be encouraged to walk around as soon as possible. You may have to wear compression stockings. These stockings help to prevent blood clots and reduce swelling in your legs. Do not drive for 24 hours if you were given a sedative during your procedure. Summary Transurethral resection of a bladder tumor is the removal (resection) of a cancerous growth (tumor) on the inside wall of the bladder. To do this procedure, your health care provider uses a thin telescope with a light, a tiny camera, and an electric cutting edge (resectoscope). Follow your health care provider's instructions. You may need to stop or change certain medicines, and you may be told to stop eating and drinking several hours before the procedure. Your blood pressure, heart rate, breathing rate, and blood oxygen level will be monitored until you leave the hospital or clinic. You may have to wear compression stockings. These stockings help to prevent blood clots and reduce swelling in your legs. This information is not intended to replace advice given to you by your health care provider. Make sure you discuss any questions you have with your  health care provider. Document Revised: 11/16/2017 Document Reviewed: 11/17/2017 Elsevier Patient Education  [place] for Kidney Stones Laser therapy for kidney stones is a procedure to break up small, hard mineral deposits that form in the kidney (kidney stones). The procedure is done using a device that produces a focused beam of light (laser). The laser breaks up kidney stones into pieces that are small enough to be passed out of the body through urination or removed from the body during the procedure. You may need laser therapy if you have kidney stones that are painful or block your urinary tract. This procedure is done by inserting a tube (ureteroscope) into your kidney through the urethral opening. The urethra is the part of the body that drains urine from the bladder. In women, the urethra opens above the vaginal opening. In men, the urethra opens at the tip of the penis. The ureteroscope is inserted through the urethra, and surgical instruments are moved through the bladder and the muscular tube that connects the kidney to the bladder (ureter) until they reach the kidney. Tell a health care provider about: Any allergies you have. All medicines you are taking, including vitamins, herbs, eye drops, creams, and over-the-counter medicines. Any problems you or family members have had with anesthetic medicines. Any blood disorders you have. Any surgeries you have had. Any medical conditions you have. Whether you are pregnant or may be pregnant.  What are the risks? Generally, this is a safe procedure. However, problems may occur, including: Infection. Bleeding. Allergic reactions to medicines. Damage to the urethra, bladder, or ureter. Urinary tract infection (UTI). Narrowing of the urethra (urethral stricture). Difficulty passing urine. Blockage of the kidney caused by a fragment of kidney stone. What happens before the procedure? Medicines Ask your health care  provider about: Changing or stopping your regular medicines. This is especially important if you are taking diabetes medicines or blood thinners. Taking medicines such as aspirin and ibuprofen. These medicines can thin your blood. Do not take these medicines unless your health care provider tells you to take them. Taking over-the-counter medicines, vitamins, herbs, and supplements. Eating and drinking Follow instructions from your health care provider about eating and drinking, which may include: 8 hours before the procedure - stop eating heavy meals or foods, such as meat, fried foods, or fatty foods. 6 hours before the procedure - stop eating light meals or foods, such as toast or cereal. 6 hours before the procedure - stop drinking milk or drinks that contain milk. 2 hours before the procedure - stop drinking clear liquids. Staying hydrated Follow instructions from your health care provider about hydration, which may include: Up to 2 hours before the procedure - you may continue to drink clear liquids, such as water, clear fruit juice, black coffee, and plain tea.  General instructions You may have a physical exam before the procedure. You may also have tests, such as imaging tests and blood or urine tests. If your ureter is too narrow, your health care provider may place a soft, flexible tube (stent) inside of it. The stent may be placed days or weeks before your laser therapy procedure. Plan to have someone take you home from the hospital or clinic. If you will be going home right after the procedure, plan to have someone stay with you for 24 hours. Do not use any products that contain nicotine or tobacco for at least 4 weeks before the procedure. These products include cigarettes, e-cigarettes, and chewing tobacco. If you need help quitting, ask your health care provider. Ask your health care provider: How your surgical site will be marked or identified. What steps will be taken to help  prevent infection. These may include: Removing hair at the surgery site. Washing skin with a germ-killing soap. Taking antibiotic medicine. What happens during the procedure?  An IV will be inserted into one of your veins. You will be given one or more of the following: A medicine to help you relax (sedative). A medicine to numb the area (local anesthetic). A medicine to make you fall asleep (general anesthetic). A ureteroscope will be inserted into your urethra. The ureteroscope will send images to a video screen in the operating room to guide your surgeon to the area of your kidney that will be treated. A small, flexible tube will be threaded through the ureteroscope and into your bladder and ureter, up to your kidney. The laser device will be inserted into your kidney through the tube. Your surgeon will pulse the laser on and off to break up kidney stones. A surgical instrument that has a tiny wire basket may be inserted through the tube into your kidney to remove the pieces of broken kidney stone. The procedure may vary among health care providers and hospitals. What happens after the procedure? Your blood pressure, heart rate, breathing rate, and blood oxygen level will be monitored until you leave the hospital or clinic. You  will be given pain medicine as needed. You may continue to receive antibiotics. You may have a stent temporarily placed in your ureter. Do not drive for 24 hours if you were given a sedative during your procedure. You may be given a strainer to collect any stone fragments that you pass in your urine. Your health care provider may have these tested. Summary Laser therapy for kidney stones is a procedure to break up kidney stones into pieces that are small enough to be passed out of the body through urination or removed during the procedure. Follow instructions from your health care provider about eating and drinking before the procedure. During the procedure, the  ureteroscope will send images to a video screen to guide your surgeon to the area of your kidney that will be treated. Do not drive for 24 hours if you were given a sedative during your procedure. This information is not intended to replace advice given to you by your health care provider. Make sure you discuss any questions you have with your health care provider. Document Revised: 12/21/2020 Document Reviewed: 12/21/2020 Elsevier Patient Education  [place].

## 2021-06-09 NOTE — Progress Notes (Signed)
Surgical Physician Order Form [facility]  * Scheduling expectation : Next Available  *Length of Case: 1 hour  *Clearance needed: yes, oncology regarding low platelets(Dr Donneta Romberg, NP [name])  *Anticoagulation Instructions: Hold all anticoagulants  *Aspirin Instructions: Hold Aspirin  *Post-op visit Date/Instructions:   TBD  *Diagnosis: Right Ureteral Stone and bladder tumor  *Procedure: right Ureteroscopy w/laser lithotripsy & stent placement (75916), TURBT   Additional orders: N/A  -Admit type: OUTpatient  -Anesthesia: General  -VTE Prophylaxis Standing Order SCDs       Other:   -Standing Lab Orders Per Anesthesia    Lab other: UA&Urine Culture, gemcitabine 2 g / 40 mL  -Standing Test orders EKG/Chest x-ray per Anesthesia       Test other:   - Medications:  Ancef 2gm IV  -Other orders:  N/A

## 2021-06-11 ENCOUNTER — Other Ambulatory Visit
Admission: RE | Admit: 2021-06-11 | Discharge: 2021-06-11 | Disposition: A | Discharge status: Home / Self Care | Payer: Medicare Other | Attending: Urology | Admitting: Urology

## 2021-06-11 ENCOUNTER — Other Ambulatory Visit: Payer: Self-pay

## 2021-06-11 ENCOUNTER — Telehealth: Payer: Self-pay

## 2021-06-11 DIAGNOSIS — N201 Calculus of ureter: Secondary | ICD-10-CM | POA: Diagnosis present

## 2021-06-11 DIAGNOSIS — D494 Neoplasm of unspecified behavior of bladder: Secondary | ICD-10-CM | POA: Diagnosis present

## 2021-06-11 LAB — URINALYSIS, COMPLETE (UACMP) WITH MICROSCOPIC
Bilirubin Urine: NEGATIVE
Glucose, UA: NEGATIVE mg/dL
Hgb urine dipstick: NEGATIVE
Ketones, ur: NEGATIVE mg/dL
Leukocytes,Ua: NEGATIVE
Nitrite: NEGATIVE
Protein, ur: NEGATIVE mg/dL
Specific Gravity, Urine: 1.015 (ref 1.005–1.030)
pH: 6.5 (ref 5.0–8.0)

## 2021-06-11 NOTE — Progress Notes (Signed)
REQUEST FOR SURGICAL CLEARANCE       Date: Date: 06/11/21  Faxed to: Dr. Bernette Redbird Office  Surgeon: Dr. Nickolas Madrid, MD     Date of Surgery: 06/18/2021  Operation: Right Ureteroscopy with Laser Lithotripsy and Stent Placement, Transurethral Resection of Bladder Tumor  Anesthesia Type: General   Diagnosis: Right Ureteral Stone, Bladder Tumor  Patient Requires:    Medical Clearance : Yes  Reason: Low Platelet Count  Risk Assessment:    Low   []       Moderate   []     High   []           This patient is optimized for surgery  YES []       NO   []    I recommend further assessment/workup prior to surgery. YES []      NO  []   Appointment scheduled for: _______________________   Further recommendations: ____________________________________     Physician Signature:__________________________________   Printed Name: ________________________________________   Date: _________________

## 2021-06-11 NOTE — Progress Notes (Signed)
[facility] Posting Form   Surgery Date/Time: Date: 06/18/2021  Surgeon: Dr. Nickolas Madrid, MD  Surgery Location: Day Surgery  Inpt ( No  )   Outpt (Yes)   Obs ( No  )   Diagnosis: N20.1 Right Ureteral Stone; D49.4 Bladder Tumor  -CPT: [phone]  Surgery: Right Ureteroscopy with Laser Lithotripsy and Stent Placement, Transurethral Resection of Bladder Tumor with instillation of Gemcitabine  Stop Anticoagulations: Yes, also hold ASA  Cardiac/Medical/Pulmonary Clearance needed: yes  Clearance needed from Dr: Learta Codding  Clearance request sent on: Date: 06/11/21   *Orders entered into EPIC  Date: 06/11/21   *Case booked in EPIC  Date: 06/11/21  *Notified pt of Surgery: Date: 06/11/21  PRE-OP UA & CX: Yes, will obtain at [facility] lab today  *Placed into Prior Authorization Work Fabio Bering Date: 06/11/21   Assistant/laser/rep:No

## 2021-06-11 NOTE — Telephone Encounter (Signed)
I spoke with [name]. We have discussed possible surgery dates and Friday February 17th, 2023 was agreed upon by all parties. Patient given information about surgery date, what to expect pre-operatively and post operatively.   We discussed that a Pre-Admission Testing office will be calling to set up the pre-op visit that will take place prior to surgery, and that these appointments are typically done over the phone with a Pre-Admissions RN.   Informed patient that our office will communicate any additional care to be provided after surgery. Patients questions or concerns were discussed during our call. Advised to call our office should there be any additional information, questions or concerns that arise. Patient verbalized understanding.

## 2021-06-13 LAB — URINE CULTURE

## 2021-06-16 ENCOUNTER — Telehealth: Payer: Self-pay

## 2021-06-16 ENCOUNTER — Other Ambulatory Visit: Payer: Self-pay

## 2021-06-16 ENCOUNTER — Other Ambulatory Visit
Admission: RE | Admit: 2021-06-16 | Discharge: 2021-06-16 | Disposition: A | Discharge status: Home / Self Care | Payer: Medicare Other | Source: Ambulatory Visit | Attending: Urology | Admitting: Urology

## 2021-06-16 VITALS — Ht 64.0 in | Wt 172.0 lb

## 2021-06-16 DIAGNOSIS — Z01818 Encounter for other preprocedural examination: Secondary | ICD-10-CM | POA: Insufficient documentation

## 2021-06-16 DIAGNOSIS — M3119 Other thrombotic microangiopathy: Secondary | ICD-10-CM | POA: Insufficient documentation

## 2021-06-16 HISTORY — DX: Cardiac murmur, unspecified: R01.1

## 2021-06-16 LAB — CBC
HCT: 50.2 % — ABNORMAL HIGH (ref 36.0–46.0)
Hemoglobin: 16.8 g/dL — ABNORMAL HIGH (ref 12.0–15.0)
MCH: 32.5 pg (ref 26.0–34.0)
MCHC: 33.5 g/dL (ref 30.0–36.0)
MCV: 97.1 fL (ref 80.0–100.0)
Platelets: 100 10*3/uL — ABNORMAL LOW (ref 150–400)
RBC: 5.17 MIL/uL — ABNORMAL HIGH (ref 3.87–5.11)
RDW: 13.3 % (ref 11.5–15.5)
WBC: 8.1 10*3/uL (ref 4.0–10.5)
nRBC: 0 % (ref 0.0–0.2)

## 2021-06-16 NOTE — Telephone Encounter (Signed)
-----   Message from Ladell Pier, MD sent at 06/15/2021  5:27 PM EST ----- Ok, to have surgery from hematology standpoint,platelets last 93.  Would check platelet count prior to surgery. I am not her medical doctor, can only give clearance with regard to hematology issues ----- Message ----- From: Gerald Leitz, CMA Sent: 06/11/2021  11:19 AM EST To: [name], NP, Ladell Pier, MD  ##Clearance needed for patient## Surgery scheduled for 06/18/2021. Thank you!

## 2021-06-16 NOTE — Telephone Encounter (Signed)
Clearance received from Hematology will forward to [place].

## 2021-06-16 NOTE — Patient Instructions (Addendum)
Your procedure is scheduled on: Friday June 18, 2021. Report to Day Surgery inside [facility] 2nd floor, stop by admissions desk before getting on elevator.  To find out your arrival time please call [phone] between 1PM - 3PM on Thursday June 17, 2021.  Remember: Instructions that are not followed completely may result in serious medical risk,  up to and including death, or upon the discretion of your surgeon and anesthesiologist your  surgery may need to be rescheduled.     _X__ 1. Do not eat food or drink fluids after midnight the night before your procedure.                 No chewing gum or hard candies.   __X__2.  On the morning of surgery brush your teeth with toothpaste and water, you                may rinse your mouth with mouthwash if you wish.  Do not swallow any toothpaste or mouthwash.     _X__ 3.  No Alcohol for 24 hours before or after surgery.   _X__ 4.  Do Not Smoke or use e-cigarettes For 24 Hours Prior to Your Surgery.                 Do not use any chewable tobacco products for at least 6 hours prior to                 Surgery.  _X__  5.  Do not use any recreational drugs (marijuana, cocaine, heroin, ecstasy, MDMA or other)                For at least one week prior to your surgery.  Combination of these drugs with anesthesia                May have life threatening results.  ____  6.  Bring all medications with you on the day of surgery if instructed.   __X__  7.  Notify your doctor if there is any change in your medical condition      (cold, fever, infections).     Do not wear jewelry, make-up, hairpins, clips or nail polish. Do not wear lotions, powders, or perfumes. You may wear deodorant. Do not shave 48 hours prior to surgery. Men may shave face and neck. Do not bring valuables to the hospital.    [facility] is not responsible for any belongings or valuables.  Contacts, dentures or bridgework may not be worn into  surgery. Leave your suitcase in the car. After surgery it may be brought to your room. For patients admitted to the hospital, discharge time is determined by your treatment team.   Patients discharged the day of surgery will not be allowed to drive home.   Make arrangements for someone to be with you for the first 24 hours of your Same Day Discharge.  __X__ Take these medicines the morning of surgery with A SIP OF WATER:    1. FLUoxetine (PROZAC) 20 MG  2. gabapentin (NEURONTIN) 600 MG (if needed)  3. azaTHIOprine (IMURAN) 50 MG  4.  5.  6.  ____ Fleet Enema (as directed)   ____ Use CHG Soap (or wipes) as directed  ____ Use Benzoyl Peroxide Gel as instructed  __X__ Use inhalers on the day of surgery  tiotropium (SPIRIVA) 18 MCG inhalation capsule  ____ Stop metformin 2 days prior to surgery    ____ Take 1/2 of  usual insulin dose the night before surgery. No insulin the morning          of surgery.   ____ Call your PCP, cardiologist, or Pulmonologist if taking Coumadin/Plavix/aspirin and ask when to stop before your surgery.   __X__ One Week prior to surgery- Stop Anti-inflammatories such as Ibuprofen, Aleve, Advil, Motrin, meloxicam (MOBIC), diclofenac, etodolac, ketorolac, Toradol, Daypro, piroxicam, Goody's or BC powders. OK TO USE TYLENOL IF NEEDED   __X__ Stop ALL supplements until after surgery. folic acid (FOLVITE) 1 MG   ____ Bring C-Pap to the hospital.    If you have any questions regarding your pre-procedure instructions,  Please call Pre-admit Testing at [phone]

## 2021-06-18 ENCOUNTER — Ambulatory Visit: Payer: Medicare Other | Admitting: Urgent Care

## 2021-06-18 ENCOUNTER — Other Ambulatory Visit: Payer: Self-pay

## 2021-06-18 ENCOUNTER — Ambulatory Visit
Admission: RE | Admit: 2021-06-18 | Discharge: 2021-06-18 | Disposition: A | Discharge status: Home / Self Care | Payer: Medicare Other | Source: Home / Self Care | Attending: Urology | Admitting: Urology

## 2021-06-18 ENCOUNTER — Encounter: Payer: Self-pay | Admitting: Urology

## 2021-06-18 ENCOUNTER — Encounter
Admission: RE | Disposition: A | Discharge status: Home / Self Care | Payer: Self-pay | Source: Home / Self Care | Attending: Urology

## 2021-06-18 ENCOUNTER — Ambulatory Visit: Payer: Medicare Other

## 2021-06-18 DIAGNOSIS — N10 Acute tubulo-interstitial nephritis: Secondary | ICD-10-CM | POA: Diagnosis not present

## 2021-06-18 DIAGNOSIS — F17200 Nicotine dependence, unspecified, uncomplicated: Secondary | ICD-10-CM | POA: Insufficient documentation

## 2021-06-18 DIAGNOSIS — G8929 Other chronic pain: Secondary | ICD-10-CM | POA: Insufficient documentation

## 2021-06-18 DIAGNOSIS — N201 Calculus of ureter: Secondary | ICD-10-CM | POA: Diagnosis not present

## 2021-06-18 DIAGNOSIS — T8140XA Infection following a procedure, unspecified, initial encounter: Secondary | ICD-10-CM | POA: Diagnosis not present

## 2021-06-18 DIAGNOSIS — I509 Heart failure, unspecified: Secondary | ICD-10-CM | POA: Insufficient documentation

## 2021-06-18 DIAGNOSIS — M3119 Other thrombotic microangiopathy: Secondary | ICD-10-CM | POA: Insufficient documentation

## 2021-06-18 DIAGNOSIS — M549 Dorsalgia, unspecified: Secondary | ICD-10-CM | POA: Insufficient documentation

## 2021-06-18 DIAGNOSIS — C679 Malignant neoplasm of bladder, unspecified: Secondary | ICD-10-CM | POA: Insufficient documentation

## 2021-06-18 DIAGNOSIS — N179 Acute kidney failure, unspecified: Secondary | ICD-10-CM | POA: Diagnosis not present

## 2021-06-18 DIAGNOSIS — R109 Unspecified abdominal pain: Secondary | ICD-10-CM | POA: Diagnosis not present

## 2021-06-18 DIAGNOSIS — N134 Hydroureter: Secondary | ICD-10-CM | POA: Diagnosis not present

## 2021-06-18 DIAGNOSIS — D494 Neoplasm of unspecified behavior of bladder: Secondary | ICD-10-CM

## 2021-06-18 DIAGNOSIS — J449 Chronic obstructive pulmonary disease, unspecified: Secondary | ICD-10-CM | POA: Insufficient documentation

## 2021-06-18 DIAGNOSIS — N202 Calculus of kidney with calculus of ureter: Secondary | ICD-10-CM | POA: Insufficient documentation

## 2021-06-18 DIAGNOSIS — F17210 Nicotine dependence, cigarettes, uncomplicated: Secondary | ICD-10-CM | POA: Insufficient documentation

## 2021-06-18 DIAGNOSIS — F32A Depression, unspecified: Secondary | ICD-10-CM | POA: Insufficient documentation

## 2021-06-18 DIAGNOSIS — N1330 Unspecified hydronephrosis: Secondary | ICD-10-CM | POA: Diagnosis not present

## 2021-06-18 HISTORY — PX: TRANSURETHRAL RESECTION OF BLADDER TUMOR: SHX2575

## 2021-06-18 HISTORY — PX: CYSTOSCOPY/URETEROSCOPY/HOLMIUM LASER/STENT PLACEMENT: SHX6546

## 2021-06-18 HISTORY — PX: BLADDER INSTILLATION: SHX6893

## 2021-06-18 SURGERY — CYSTOSCOPY/URETEROSCOPY/HOLMIUM LASER/STENT PLACEMENT
Anesthesia: General | Laterality: Right

## 2021-06-18 MED ORDER — PROPOFOL 10 MG/ML IV BOLUS
INTRAVENOUS | Status: DC | PRN
  Administered 2021-06-18: 130 mg via INTRAVENOUS

## 2021-06-18 MED ORDER — DEXAMETHASONE SODIUM PHOSPHATE 10 MG/ML IJ SOLN
INTRAMUSCULAR | Status: DC | PRN
Start: 2021-06-18 — End: 2021-06-18
  Administered 2021-06-18: 5 mg via INTRAVENOUS

## 2021-06-18 MED ORDER — HYDROCODONE-ACETAMINOPHEN 5-325 MG PO TABS: 1.0000 | ORAL_TABLET | Freq: Four times a day (QID) | ORAL | 0 refills | Status: DC | PRN

## 2021-06-18 MED ORDER — ONDANSETRON HCL 4 MG/2ML IJ SOLN: 4.0000 mg | Freq: Once | INTRAMUSCULAR | Status: DC | PRN

## 2021-06-18 MED ORDER — LACTATED RINGERS IV SOLN: INTRAVENOUS | Status: DC

## 2021-06-18 MED ORDER — ORAL CARE MOUTH RINSE: 15.0000 mL | Freq: Once | OROMUCOSAL | Status: AC

## 2021-06-18 MED ORDER — PHENYLEPHRINE 40 MCG/ML (10ML) SYRINGE FOR IV PUSH (FOR BLOOD PRESSURE SUPPORT)
PREFILLED_SYRINGE | INTRAVENOUS | Status: DC | PRN
  Administered 2021-06-18: 200 ug via INTRAVENOUS
  Administered 2021-06-18: 160 ug via INTRAVENOUS
  Administered 2021-06-18: 120 ug via INTRAVENOUS
  Administered 2021-06-18: 160 ug via INTRAVENOUS
  Administered 2021-06-18: 200 ug via INTRAVENOUS
  Administered 2021-06-18: 160 ug via INTRAVENOUS

## 2021-06-18 MED ORDER — LIDOCAINE HCL (CARDIAC) PF 100 MG/5ML IV SOSY
PREFILLED_SYRINGE | INTRAVENOUS | Status: DC | PRN
  Administered 2021-06-18: 60 mg via INTRAVENOUS

## 2021-06-18 MED ORDER — FENTANYL CITRATE (PF) 100 MCG/2ML IJ SOLN
INTRAMUSCULAR | Status: AC
Start: 2021-06-18 — End: ?
  Filled 2021-06-18: qty 2

## 2021-06-18 MED ORDER — FENTANYL CITRATE (PF) 100 MCG/2ML IJ SOLN
INTRAMUSCULAR | Status: DC | PRN
  Administered 2021-06-18: 50 ug via INTRAVENOUS

## 2021-06-18 MED ORDER — IOHEXOL 180 MG/ML  SOLN
INTRAMUSCULAR | Status: DC | PRN
  Administered 2021-06-18: 10 mL via ORAL

## 2021-06-18 MED ORDER — ONDANSETRON HCL 4 MG/2ML IJ SOLN
INTRAMUSCULAR | Status: DC | PRN
  Administered 2021-06-18: 4 mg via INTRAVENOUS

## 2021-06-18 MED ORDER — ROCURONIUM BROMIDE 10 MG/ML (PF) SYRINGE
PREFILLED_SYRINGE | INTRAVENOUS | Status: AC
  Filled 2021-06-18: qty 10

## 2021-06-18 MED ORDER — FAMOTIDINE 20 MG PO TABS
ORAL_TABLET | ORAL | Status: AC
  Administered 2021-06-18: 20 mg via ORAL
  Filled 2021-06-18: qty 1

## 2021-06-18 MED ORDER — EPHEDRINE 5 MG/ML INJ
INTRAVENOUS | Status: AC
  Filled 2021-06-18: qty 5

## 2021-06-18 MED ORDER — FAMOTIDINE 20 MG PO TABS: 20.0000 mg | ORAL_TABLET | Freq: Once | ORAL | Status: AC

## 2021-06-18 MED ORDER — CEFAZOLIN SODIUM-DEXTROSE 2-4 GM/100ML-% IV SOLN
2.0000 g | INTRAVENOUS | Status: AC
  Administered 2021-06-18: 2 g via INTRAVENOUS

## 2021-06-18 MED ORDER — LIDOCAINE HCL (PF) 2 % IJ SOLN
INTRAMUSCULAR | Status: AC
Start: 2021-06-18 — End: ?
  Filled 2021-06-18: qty 5

## 2021-06-18 MED ORDER — FENTANYL CITRATE (PF) 100 MCG/2ML IJ SOLN: 25.0000 ug | INTRAMUSCULAR | Status: DC | PRN

## 2021-06-18 MED ORDER — SUGAMMADEX SODIUM 500 MG/5ML IV SOLN
INTRAVENOUS | Status: AC
  Filled 2021-06-18: qty 5

## 2021-06-18 MED ORDER — PHENYLEPHRINE HCL-NACL 20-0.9 MG/250ML-% IV SOLN
INTRAVENOUS | Status: DC | PRN
  Administered 2021-06-18: 55 ug/min via INTRAVENOUS

## 2021-06-18 MED ORDER — PROPOFOL 10 MG/ML IV BOLUS
INTRAVENOUS | Status: AC
  Filled 2021-06-18: qty 20

## 2021-06-18 MED ORDER — OXYCODONE HCL 5 MG PO TABS: 5.0000 mg | ORAL_TABLET | Freq: Once | ORAL | Status: DC | PRN

## 2021-06-18 MED ORDER — ROCURONIUM BROMIDE 100 MG/10ML IV SOLN
INTRAVENOUS | Status: DC | PRN
  Administered 2021-06-18: 50 mg via INTRAVENOUS

## 2021-06-18 MED ORDER — ONDANSETRON HCL 4 MG/2ML IJ SOLN
INTRAMUSCULAR | Status: AC
  Filled 2021-06-18: qty 2

## 2021-06-18 MED ORDER — SUGAMMADEX SODIUM 200 MG/2ML IV SOLN
INTRAVENOUS | Status: DC | PRN
  Administered 2021-06-18: 320 mg via INTRAVENOUS

## 2021-06-18 MED ORDER — CEFAZOLIN SODIUM-DEXTROSE 2-4 GM/100ML-% IV SOLN
INTRAVENOUS | Status: AC
  Filled 2021-06-18: qty 100

## 2021-06-18 MED ORDER — GEMCITABINE CHEMO FOR BLADDER INSTILLATION 2000 MG
2000.0000 mg | Freq: Once | INTRAVENOUS | Status: DC
  Filled 2021-06-18: qty 52.6

## 2021-06-18 MED ORDER — SODIUM CHLORIDE 0.9 % IR SOLN
Status: DC | PRN
  Administered 2021-06-18: 3000 mL via INTRAVESICAL

## 2021-06-18 MED ORDER — DEXAMETHASONE SODIUM PHOSPHATE 10 MG/ML IJ SOLN
INTRAMUSCULAR | Status: AC
  Filled 2021-06-18: qty 1

## 2021-06-18 MED ORDER — CHLORHEXIDINE GLUCONATE 0.12 % MT SOLN: 15.0000 mL | Freq: Once | OROMUCOSAL | Status: AC

## 2021-06-18 MED ORDER — CHLORHEXIDINE GLUCONATE 0.12 % MT SOLN
OROMUCOSAL | Status: AC
  Administered 2021-06-18: 15 mL via OROMUCOSAL
  Filled 2021-06-18: qty 15

## 2021-06-18 MED ORDER — ACETAMINOPHEN 10 MG/ML IV SOLN
INTRAVENOUS | Status: DC | PRN
Start: 2021-06-18 — End: 2021-06-18
  Administered 2021-06-18: 1000 mg via INTRAVENOUS

## 2021-06-18 MED ORDER — ACETAMINOPHEN 10 MG/ML IV SOLN: 1000.0000 mg | Freq: Once | INTRAVENOUS | Status: DC | PRN

## 2021-06-18 MED ORDER — NITROFURANTOIN MACROCRYSTAL 50 MG PO CAPS: 50.0000 mg | ORAL_CAPSULE | Freq: Every day | ORAL | 0 refills | Status: DC

## 2021-06-18 MED ORDER — GEMCITABINE CHEMO FOR BLADDER INSTILLATION 2000 MG
INTRAVENOUS | Status: DC | PRN
  Administered 2021-06-18: 2000 mg via INTRAVESICAL

## 2021-06-18 MED ORDER — OXYCODONE HCL 5 MG/5ML PO SOLN: 5.0000 mg | Freq: Once | ORAL | Status: DC | PRN

## 2021-06-18 MED ORDER — ACETAMINOPHEN 10 MG/ML IV SOLN
INTRAVENOUS | Status: AC
  Filled 2021-06-18: qty 100

## 2021-06-18 SURGICAL SUPPLY — 51 items
ADH LQ OCL WTPRF AMP STRL LF (MISCELLANEOUS) ×2
ADHESIVE MASTISOL STRL (MISCELLANEOUS) ×1 IMPLANT
BAG DRAIN CYSTO-URO LG1000N (MISCELLANEOUS) ×3 IMPLANT
BAG DRN RND TRDRP ANRFLXCHMBR (UROLOGICAL SUPPLIES) ×2
BAG URINE DRAIN 2000ML AR STRL (UROLOGICAL SUPPLIES) ×3 IMPLANT
BRUSH SCRUB EZ  4% CHG (MISCELLANEOUS) ×3
BRUSH SCRUB EZ 1% IODOPHOR (MISCELLANEOUS) ×2 IMPLANT
BRUSH SCRUB EZ 4% CHG (MISCELLANEOUS) ×2 IMPLANT
CATH FOL 2WAY LX 18X30 (CATHETERS) ×3 IMPLANT
CATH URET FLEX-TIP 2 LUMEN 10F (CATHETERS) IMPLANT
CATH URETL OPEN 5X70 (CATHETERS) IMPLANT
CNTNR SPEC 2.5X3XGRAD LEK (MISCELLANEOUS)
CONT SPEC 4OZ STER OR WHT (MISCELLANEOUS)
CONT SPEC 4OZ STRL OR WHT (MISCELLANEOUS)
CONTAINER SPEC 2.5X3XGRAD LEK (MISCELLANEOUS) IMPLANT
DRAPE UTILITY 15X26 TOWEL STRL (DRAPES) ×3 IMPLANT
DRSG TEGADERM 2-3/8X2-3/4 SM (GAUZE/BANDAGES/DRESSINGS) IMPLANT
DRSG TELFA 4X3 1S NADH ST (GAUZE/BANDAGES/DRESSINGS) ×3 IMPLANT
ELECT LOOP 22F BIPOLAR SML (ELECTROSURGICAL)
ELECT REM PT RETURN 9FT ADLT (ELECTROSURGICAL)
ELECTRODE LOOP 22F BIPOLAR SML (ELECTROSURGICAL) IMPLANT
ELECTRODE REM PT RTRN 9FT ADLT (ELECTROSURGICAL) IMPLANT
GAUZE 4X4 16PLY ~~LOC~~+RFID DBL (SPONGE) ×6 IMPLANT
GLOVE SURG UNDER POLY LF SZ7.5 (GLOVE) ×3 IMPLANT
GOWN STRL REUS W/ TWL LRG LVL3 (GOWN DISPOSABLE) ×2 IMPLANT
GOWN STRL REUS W/ TWL XL LVL3 (GOWN DISPOSABLE) ×2 IMPLANT
GOWN STRL REUS W/TWL LRG LVL3 (GOWN DISPOSABLE) ×3
GOWN STRL REUS W/TWL XL LVL3 (GOWN DISPOSABLE) ×3
GUIDEWIRE STR DUAL SENSOR (WIRE) ×3 IMPLANT
INFUSOR MANOMETER BAG 3000ML (MISCELLANEOUS) ×3 IMPLANT
IV NS IRRIG 3000ML ARTHROMATIC (IV SOLUTION) ×6 IMPLANT
KIT TURNOVER CYSTO (KITS) ×3 IMPLANT
LOOP CUT BIPOLAR 24F LRG (ELECTROSURGICAL) IMPLANT
MANIFOLD NEPTUNE II (INSTRUMENTS) ×3 IMPLANT
NDL SAFETY ECLIPSE 18X1.5 (NEEDLE) ×2 IMPLANT
NEEDLE HYPO 18GX1.5 SHARP (NEEDLE) ×3
PACK CYSTO AR (MISCELLANEOUS) ×3 IMPLANT
PAD ARMBOARD 7.5X6 YLW CONV (MISCELLANEOUS) ×3 IMPLANT
SET CYSTO W/LG BORE CLAMP LF (SET/KITS/TRAYS/PACK) ×3 IMPLANT
SET IRRIG Y TYPE TUR BLADDER L (SET/KITS/TRAYS/PACK) ×3 IMPLANT
SHEATH URETERAL 12FRX35CM (MISCELLANEOUS) IMPLANT
STENT URET 6FRX24 CONTOUR (STENTS) IMPLANT
STENT URET 6FRX26 CONTOUR (STENTS) IMPLANT
SURGILUBE 2OZ TUBE FLIPTOP (MISCELLANEOUS) ×3 IMPLANT
SYR 10ML LL (SYRINGE) ×3 IMPLANT
SYR TOOMEY IRRIG 70ML (MISCELLANEOUS) ×3
SYRINGE TOOMEY IRRIG 70ML (MISCELLANEOUS) ×2 IMPLANT
TRACTIP FLEXIVA PULSE ID 200 (Laser) ×1 IMPLANT
VALVE UROSEAL ADJ ENDO (VALVE) ×1 IMPLANT
WATER STERILE IRR 1000ML POUR (IV SOLUTION) ×3 IMPLANT
WATER STERILE IRR 500ML POUR (IV SOLUTION) ×3 IMPLANT

## 2021-06-18 NOTE — H&P (Signed)
06/18/21 12:25 PM   [name] 1940/08/06 [id number]  CC: Right ureteral stone, bladder tumor  HPI: 81 year old female who presented with gross hematuria, and microscopic hematuria on urinalysis.  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  I personally viewed and interpreted the CT scan that shows a 3 mm right distal ureteral stone without hydronephrosis, as well as concern for a 1 cm bladder tumor at the left base of the bladder.  We reviewed these results at length today, and I recommended scheduling cystoscopy, right ureteroscopy/laser/stent, and bladder biopsy/fulguration/gemcitabine.      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   History of TTP (thrombotic thrombocytopenic purpura)    Hypokalemia 08/08/2013   Steroid related   Normal echocardiogram 03/15/2011   Oral thrush 08/08/2013   Septicemia 03/2011   Spinal stenosis, lumbar    Tobacco abuse 05/10/2013    Surgical History: Past Surgical History:  Procedure Laterality Date   BACK SURGERY     CATARACT EXTRACTION W/ INTRAOCULAR LENS  IMPLANT, BILATERAL  ~ 2010   CESAREAN SECTION  10/09/1976   ESOPHAGOGASTRODUODENOSCOPY (EGD) WITH PROPOFOL N/A 09/27/2020   Procedure: ESOPHAGOGASTRODUODENOSCOPY (EGD) WITH PROPOFOL;  Surgeon: Ronald Lobo, MD;  Location: [facility];  Service: Endoscopy;  Laterality: N/A;   EYE SURGERY     INSERTION OF DIALYSIS CATHETER Left 07/12/2013   Procedure: INSERTION OF DIALYSIS CATHETER   with Ultrasound;   Surgeon: Rosetta Posner, MD;  Location: [facility];  Service: Vascular;  Laterality: Left;   IR PERC TUN PERIT CATH WO PORT S&I /IMAG  07/16/2020   IR RADIOLOGIST EVAL & MGMT  09/26/2020   IR US GUIDE VASC ACCESS LEFT  07/16/2020   LUMBAR LAMINECTOMY/DECOMPRESSION MICRODISCECTOMY  03/16/2011   Procedure: LUMBAR LAMINECTOMY/DECOMPRESSION MICRODISCECTOMY;  Surgeon: Elaina Hoops;  Location: [facility];  Service: Neurosurgery;  Laterality: N/A;   TEE WITHOUT CARDIOVERSION  03/21/2011   Procedure: TRANSESOPHAGEAL ECHOCARDIOGRAM (TEE);  Surgeon: Laverda Page;  Location: [facility];  Service: Cardiovascular;  Laterality: N/A;  TEE for vegetations   TONSILLECTOMY     TUBAL LIGATION      Family History: Family History  Problem Relation Age of Onset   Heart disease Mother    Leukemia Sister    Breast cancer Neg Hx    Colon cancer Neg Hx    Ovarian cancer Neg Hx    Uterine cancer Neg Hx    Cervical cancer Neg Hx     Social History:  reports that she has been smoking cigarettes. She has a 165.00 pack-year smoking history. She has never been exposed to tobacco smoke. She has never used smokeless tobacco. She reports current alcohol use of about 7.0 standard drinks per week. She reports that she does not use drugs.  Physical Exam: BP (!) 144/62    Pulse 77    Temp 98.3 F (36.8 C) (Temporal)    Resp 18  Ht 5\' 4"  (1.626 m)    Wt 78 kg    SpO2 95%    BMI 29.52 kg/m    Constitutional:  Alert and oriented, No acute distress. Cardiovascular: Regular rate and rhythm Respiratory: Clear to auscultation bilaterally GI: Abdomen is soft, nontender, nondistended, no abdominal masses  Laboratory Data: UA benign, culture mixed species  Assessment & Plan:   81 year old female with gross hematuria and right distal ureteral stone as well as suspected bladder tumor on CT.  We specifically discussed the risks ureteroscopy including bleeding, infection/sepsis, stent related symptoms including flank  pain/urgency/frequency/incontinence/dysuria, ureteral injury, inability to access stone, or need for staged or additional procedures.   We discussed transurethral resection of bladder tumor (TURBT) and risks and benefits at length. This is typically a 1 to 2-hour procedure done under general anesthesia in the operating room.  A scope is inserted through the urethra and used to resect abnormal tissue within the bladder, which is then sent to the pathologist to determine grade and stage of the tumor.  Risks include bleeding, infection, need for temporary Foley placement, and bladder perforation.  Treatment strategies are based on the type of tumor and depth of invasion.  We briefly reviewed the different treatment pathways for non-muscle invasive and muscle invasive bladder cancer  Right ureteroscopy, laser lithotripsy, stent placement, TURBT with gemcitabine  Nickolas Madrid, MD 06/18/2021  [facility] [address], [place], [place] [address] [phone]

## 2021-06-18 NOTE — Anesthesia Preprocedure Evaluation (Addendum)
Anesthesia Evaluation  Patient identified by MRN, date of birth, ID band Patient awake    Reviewed: Allergy & Precautions, NPO status , Patient's Chart, lab work & pertinent test results  History of Anesthesia Complications Negative for: history of anesthetic complications  Airway Mallampati: I   Neck ROM: Full    Dental   Caps, lower right molar chipped:   Pulmonary COPD, Current Smoker (2 ppd) and Patient abstained from smoking.,    Pulmonary exam normal breath sounds clear to auscultation       Cardiovascular +CHF  Normal cardiovascular exam Rhythm:Regular Rate:Normal  ECG 06/16/21:  Normal sinus rhythm Possible Left atrial enlargement Nonspecific T wave abnormality   Neuro/Psych PSYCHIATRIC DISORDERS Depression Chronic back pain    GI/Hepatic negative GI ROS,   Endo/Other  negative endocrine ROS  Renal/GU Renal disease (nephrolithiasis)     Musculoskeletal  (+) Arthritis ,   Abdominal   Peds  Hematology  (+) Blood dyscrasia (hx TTP), anemia ,   Anesthesia Other Findings   Reproductive/Obstetrics                            Anesthesia Physical Anesthesia Plan  ASA: 3  Anesthesia Plan: General   Post-op Pain Management:    Induction: Intravenous  PONV Risk Score and Plan: 2 and Ondansetron, Dexamethasone and Treatment may vary due to age or medical condition  Airway Management Planned: Oral ETT  Additional Equipment:   Intra-op Plan:   Post-operative Plan: Extubation in OR  Informed Consent: I have reviewed the patients History and Physical, chart, labs and discussed the procedure including the risks, benefits and alternatives for the proposed anesthesia with the patient or authorized representative who has indicated his/her understanding and acceptance.     Dental advisory given  Plan Discussed with: CRNA  Anesthesia Plan Comments: (Patient consented for risks of  anesthesia including but not limited to:  - adverse reactions to medications - damage to eyes, teeth, lips or other oral mucosa - nerve damage due to positioning  - sore throat or hoarseness - damage to heart, brain, nerves, lungs, other parts of body or loss of life  Informed patient about role of CRNA in peri- and intra-operative care.  Patient voiced understanding.)        Anesthesia Quick Evaluation

## 2021-06-18 NOTE — Discharge Instructions (Signed)

## 2021-06-18 NOTE — Anesthesia Postprocedure Evaluation (Signed)
Anesthesia Post Note  Patient: [name]  Procedure(s) Performed: CYSTOSCOPY/URETEROSCOPY/HOLMIUM LASER/STENT PLACEMENT (Right) TRANSURETHRAL RESECTION OF BLADDER TUMOR (TURBT) BLADDER INSTILLATION OF GEMCITABINE  Patient location during evaluation: PACU Anesthesia Type: General Level of consciousness: awake and alert, oriented and patient cooperative Pain management: pain level controlled Vital Signs Assessment: post-procedure vital signs reviewed and stable Respiratory status: spontaneous breathing, nonlabored ventilation and respiratory function stable Cardiovascular status: blood pressure returned to baseline and stable Postop Assessment: adequate PO intake Anesthetic complications: no   No notable events documented.   Last Vitals:  Vitals:   06/18/21 1430 06/18/21 1448  BP: (!) 118/51 138/62  Pulse: 71 79  Resp: 10 16  Temp: 36.6 C (!) 36.1 C  SpO2: 96% 94%    Last Pain:  Vitals:   06/18/21 1448  TempSrc: Temporal  PainSc: 2                  Darrin Nipper

## 2021-06-18 NOTE — Op Note (Signed)
Date of procedure: 06/18/21  Preoperative diagnosis:  Right ureteral stone Right renal stone Bladder tumor  Postoperative diagnosis:  Same  Procedure: Right ureteroscopy, laser lithotripsy of distal ureteral stone Right ureteroscopy, laser lithotripsy of renal stone Cystoscopy, right retrograde pyelogram with intraoperative interpretation, right ureteral stent placement Bladder biopsy and fulguration(1 cm tumor), instillation of gemcitabine  Surgeon: Nickolas Madrid, MD  Anesthesia: General  Complications: None  Intraoperative findings:  1 cm papillary bladder tumor immediately adjacent to the left ureteral orifice, biopsied and fulgurated, good efflux of urine from left ureteral orifice after fulguration Right distal ureteral stone and right renal stone dusted, stent placed with Dangler  EBL: Minimal  Specimens: Bladder tumor  Drains: Right 6 French by 24 cm ureteral stent with Dangler, 18 French Foley  Indication: [name] is a 81 y.o. patient with gross hematuria and CT showing a 3 mm right distal ureteral stone, right renal stone, as well as a small bladder tumor.  After reviewing the management options for treatment, they elected to proceed with the above surgical procedure(s). We have discussed the potential benefits and risks of the procedure, side effects of the proposed treatment, the likelihood of the patient achieving the goals of the procedure, and any potential problems that might occur during the procedure or recuperation. Informed consent has been obtained.  Description of procedure:  The patient was taken to the operating room and general anesthesia was induced. SCDs were placed for DVT prophylaxis. The patient was placed in the dorsal lithotomy position, prepped and draped in the usual sterile fashion, and preoperative antibiotics(Ancef) were administered. A preoperative time-out was performed.   A 21 French rigid cystoscope was used to intubate the  urethra and thorough cystoscopy was performed.  The ureteral orifices were orthotopic bilaterally, and there was a 1 cm papillary tumor on a small stalk immediately adjacent to the left ureteral orifice.  There were no other bladder tumors.  I started by advancing a sensor wire into the right ureteral orifice and this advanced easily up to the kidney under fluoroscopic vision.  A semirigid short ureteroscope advanced easily alongside the wire and identified a 3 mm yellow stone in the mid ureter.  A 242 m laser fiber on settings of 0.3 J and 80 Hz was used to methodically dust the stone.  All stone fragments were irrigated free into the bladder.  A single channel digital flexible ureteroscope was advanced over the wire up to the kidney and thorough pyeloscopy revealed a 3 mm stone in the midpole.  This was fragmented on previously mentioned settings.  A retrograde pyelogram from the proximal ureter showed no extravasation or filling defects.  Careful pullback ureteroscopy showed no residual stone fragments or injury.  A cold cup biopsy forcep was then used to remove the entire 1 cm tumor in 1 bite that was just adjacent to the left ureteral orifice.  The Bugbee was used to meticulously fulgurate this area being extremely careful to avoid the ureteral orifice.  When hemostasis was achieved, there was persistent efflux of yellow urine from the left ureter.  I opted to place a stent on the right side.  The sensor wire was readvanced up to the kidney under fluoroscopic vision, and a 6 [place] by 24 cm ureteral stent uneventfully placed with a curl in the upper pole, as well as under direct vision the bladder.  The bladder was drained, and the Dangler secured to the suprapubic region with Mastisol and Tegaderm.  An 65 [place]  Foley was placed, 10 mL placed in the balloon, and irrigated with 300 mL of water and remained faint pink to clear.  2 g / 40 mL gemcitabine were instilled into the catheter and this was  clamped.  Disposition: Stable to PACU  Plan: Unclamp gemcitabine at 2:20 PM and allow to drain, Foley can be removed She can remove her stent at home on the Dangler on Wednesday 06/23/21 We will call with pathology results, and anticipate surveillance cystoscopy in clinic in 3 months  Nickolas Madrid, MD

## 2021-06-18 NOTE — Progress Notes (Signed)
Foley catheter unclamped at 1420, then D/C'd after draining

## 2021-06-18 NOTE — Transfer of Care (Signed)
Immediate Anesthesia Transfer of Care Note  Patient: [name]  Procedure(s) Performed: CYSTOSCOPY/URETEROSCOPY/HOLMIUM LASER/STENT PLACEMENT (Right) TRANSURETHRAL RESECTION OF BLADDER TUMOR (TURBT) BLADDER INSTILLATION OF GEMCITABINE  Patient Location: PACU  Anesthesia Type:General  Level of Consciousness: awake  Airway & Oxygen Therapy: Patient Spontanous Breathing and Patient connected to face mask  Post-op Assessment: Report given to RN and Post -op Vital signs reviewed and stable  Post vital signs: Reviewed and stable  Last Vitals:  Vitals Value Taken Time  BP 137/50 06/18/21 1347  Temp    Pulse 88 06/18/21 1347  Resp 20 06/18/21 1347  SpO2 97 % 06/18/21 1347  Vitals shown include unvalidated device data.  Last Pain:  Vitals:   06/18/21 1111  TempSrc: Temporal  PainSc: 0-No pain         Complications: No notable events documented.

## 2021-06-18 NOTE — Anesthesia Procedure Notes (Signed)
Procedure Name: Intubation Date/Time: 06/18/2021 12:44 PM Performed by: Loletha Grayer, CRNA Pre-anesthesia Checklist: Patient identified, Patient being monitored, Timeout performed, Emergency Drugs available and Suction available Patient Re-evaluated:Patient Re-evaluated prior to induction Oxygen Delivery Method: Circle system utilized Preoxygenation: Pre-oxygenation with 100% oxygen Induction Type: IV induction Ventilation: Mask ventilation without difficulty Laryngoscope Size: 3 and McGraph Grade View: Grade I Tube type: Oral Tube size: 7.0 mm Number of attempts: 1 Airway Equipment and Method: Stylet Placement Confirmation: ETT inserted through vocal cords under direct vision, positive ETCO2 and breath sounds checked- equal and bilateral Secured at: 21 cm Tube secured with: Tape Dental Injury: Teeth and Oropharynx as per pre-operative assessment

## 2021-06-19 ENCOUNTER — Other Ambulatory Visit: Payer: Self-pay

## 2021-06-19 ENCOUNTER — Emergency Department: Payer: Medicare Other

## 2021-06-19 ENCOUNTER — Encounter: Payer: Self-pay | Admitting: Urology

## 2021-06-19 ENCOUNTER — Inpatient Hospital Stay
Admission: EM | Admit: 2021-06-19 | Discharge: 2021-06-20 | DRG: 856 | Disposition: A | Discharge status: Other Acute Inpatient Hospital | Payer: Medicare Other | Attending: Internal Medicine | Admitting: Internal Medicine

## 2021-06-19 DIAGNOSIS — M3119 Other thrombotic microangiopathy: Secondary | ICD-10-CM | POA: Diagnosis present

## 2021-06-19 DIAGNOSIS — N134 Hydroureter: Secondary | ICD-10-CM

## 2021-06-19 DIAGNOSIS — E669 Obesity, unspecified: Secondary | ICD-10-CM | POA: Diagnosis present

## 2021-06-19 DIAGNOSIS — N10 Acute tubulo-interstitial nephritis: Secondary | ICD-10-CM | POA: Diagnosis not present

## 2021-06-19 DIAGNOSIS — Z86718 Personal history of other venous thrombosis and embolism: Secondary | ICD-10-CM

## 2021-06-19 DIAGNOSIS — J441 Chronic obstructive pulmonary disease with (acute) exacerbation: Secondary | ICD-10-CM | POA: Diagnosis present

## 2021-06-19 DIAGNOSIS — F17200 Nicotine dependence, unspecified, uncomplicated: Secondary | ICD-10-CM | POA: Diagnosis present

## 2021-06-19 DIAGNOSIS — Z20822 Contact with and (suspected) exposure to covid-19: Secondary | ICD-10-CM | POA: Diagnosis present

## 2021-06-19 DIAGNOSIS — R29703 NIHSS score 3: Secondary | ICD-10-CM | POA: Diagnosis not present

## 2021-06-19 DIAGNOSIS — C676 Malignant neoplasm of ureteric orifice: Secondary | ICD-10-CM | POA: Diagnosis present

## 2021-06-19 DIAGNOSIS — J449 Chronic obstructive pulmonary disease, unspecified: Secondary | ICD-10-CM | POA: Diagnosis present

## 2021-06-19 DIAGNOSIS — Z8551 Personal history of malignant neoplasm of bladder: Secondary | ICD-10-CM

## 2021-06-19 DIAGNOSIS — Z862 Personal history of diseases of the blood and blood-forming organs and certain disorders involving the immune mechanism: Secondary | ICD-10-CM

## 2021-06-19 DIAGNOSIS — R112 Nausea with vomiting, unspecified: Secondary | ICD-10-CM

## 2021-06-19 DIAGNOSIS — Z9889 Other specified postprocedural states: Secondary | ICD-10-CM

## 2021-06-19 DIAGNOSIS — Z882 Allergy status to sulfonamides status: Secondary | ICD-10-CM

## 2021-06-19 DIAGNOSIS — Z888 Allergy status to other drugs, medicaments and biological substances status: Secondary | ICD-10-CM

## 2021-06-19 DIAGNOSIS — M549 Dorsalgia, unspecified: Secondary | ICD-10-CM | POA: Diagnosis present

## 2021-06-19 DIAGNOSIS — M1990 Unspecified osteoarthritis, unspecified site: Secondary | ICD-10-CM | POA: Diagnosis present

## 2021-06-19 DIAGNOSIS — N12 Tubulo-interstitial nephritis, not specified as acute or chronic: Secondary | ICD-10-CM

## 2021-06-19 DIAGNOSIS — R531 Weakness: Secondary | ICD-10-CM

## 2021-06-19 DIAGNOSIS — G8929 Other chronic pain: Secondary | ICD-10-CM | POA: Diagnosis present

## 2021-06-19 DIAGNOSIS — R109 Unspecified abdominal pain: Secondary | ICD-10-CM

## 2021-06-19 DIAGNOSIS — Z8572 Personal history of non-Hodgkin lymphomas: Secondary | ICD-10-CM

## 2021-06-19 DIAGNOSIS — Z8249 Family history of ischemic heart disease and other diseases of the circulatory system: Secondary | ICD-10-CM

## 2021-06-19 DIAGNOSIS — T83122A Displacement of urinary stent, initial encounter: Secondary | ICD-10-CM | POA: Diagnosis present

## 2021-06-19 DIAGNOSIS — F17210 Nicotine dependence, cigarettes, uncomplicated: Secondary | ICD-10-CM | POA: Diagnosis present

## 2021-06-19 DIAGNOSIS — G8194 Hemiplegia, unspecified affecting left nondominant side: Secondary | ICD-10-CM | POA: Diagnosis present

## 2021-06-19 DIAGNOSIS — Z806 Family history of leukemia: Secondary | ICD-10-CM

## 2021-06-19 DIAGNOSIS — Z881 Allergy status to other antibiotic agents status: Secondary | ICD-10-CM

## 2021-06-19 DIAGNOSIS — N179 Acute kidney failure, unspecified: Secondary | ICD-10-CM

## 2021-06-19 DIAGNOSIS — Z7952 Long term (current) use of systemic steroids: Secondary | ICD-10-CM

## 2021-06-19 DIAGNOSIS — N202 Calculus of kidney with calculus of ureter: Secondary | ICD-10-CM | POA: Diagnosis present

## 2021-06-19 DIAGNOSIS — Z6830 Body mass index (BMI) 30.0-30.9, adult: Secondary | ICD-10-CM

## 2021-06-19 DIAGNOSIS — Y838 Other surgical procedures as the cause of abnormal reaction of the patient, or of later complication, without mention of misadventure at the time of the procedure: Secondary | ICD-10-CM | POA: Diagnosis present

## 2021-06-19 DIAGNOSIS — N1330 Unspecified hydronephrosis: Secondary | ICD-10-CM

## 2021-06-19 DIAGNOSIS — F32A Depression, unspecified: Secondary | ICD-10-CM | POA: Diagnosis present

## 2021-06-19 DIAGNOSIS — Z87442 Personal history of urinary calculi: Secondary | ICD-10-CM

## 2021-06-19 DIAGNOSIS — Y732 Prosthetic and other implants, materials and accessory gastroenterology and urology devices associated with adverse incidents: Secondary | ICD-10-CM | POA: Diagnosis present

## 2021-06-19 DIAGNOSIS — Z79899 Other long term (current) drug therapy: Secondary | ICD-10-CM

## 2021-06-19 DIAGNOSIS — N200 Calculus of kidney: Secondary | ICD-10-CM

## 2021-06-19 DIAGNOSIS — T8140XA Infection following a procedure, unspecified, initial encounter: Principal | ICD-10-CM | POA: Diagnosis present

## 2021-06-19 DIAGNOSIS — M48061 Spinal stenosis, lumbar region without neurogenic claudication: Secondary | ICD-10-CM | POA: Diagnosis present

## 2021-06-19 DIAGNOSIS — I6349 Cerebral infarction due to embolism of other cerebral artery: Secondary | ICD-10-CM | POA: Diagnosis not present

## 2021-06-19 DIAGNOSIS — D8481 Immunodeficiency due to conditions classified elsewhere: Secondary | ICD-10-CM | POA: Diagnosis present

## 2021-06-19 DIAGNOSIS — D696 Thrombocytopenia, unspecified: Secondary | ICD-10-CM

## 2021-06-19 HISTORY — DX: Extradural and subdural abscess, unspecified: G06.2

## 2021-06-19 HISTORY — DX: Endocarditis, valve unspecified: I38

## 2021-06-19 LAB — CBC WITH DIFFERENTIAL/PLATELET
Abs Immature Granulocytes: 0.09 K/uL — ABNORMAL HIGH (ref 0.00–0.07)
Basophils Absolute: 0 K/uL (ref 0.0–0.1)
Basophils Relative: 0 %
Eosinophils Absolute: 0 K/uL (ref 0.0–0.5)
Eosinophils Relative: 0 %
HCT: 48.2 % — ABNORMAL HIGH (ref 36.0–46.0)
Hemoglobin: 15.9 g/dL — ABNORMAL HIGH (ref 12.0–15.0)
Immature Granulocytes: 1 %
Lymphocytes Relative: 6 %
Lymphs Abs: 0.8 K/uL (ref 0.7–4.0)
MCH: 32.3 pg (ref 26.0–34.0)
MCHC: 33 g/dL (ref 30.0–36.0)
MCV: 98 fL (ref 80.0–100.0)
Monocytes Absolute: 1.3 K/uL — ABNORMAL HIGH (ref 0.1–1.0)
Monocytes Relative: 9 %
Neutro Abs: 12.5 K/uL — ABNORMAL HIGH (ref 1.7–7.7)
Neutrophils Relative %: 84 %
Platelets: 89 K/uL — ABNORMAL LOW (ref 150–400)
RBC: 4.92 MIL/uL (ref 3.87–5.11)
RDW: 13.2 % (ref 11.5–15.5)
Smear Review: DECREASED
WBC: 14.7 K/uL — ABNORMAL HIGH (ref 4.0–10.5)
nRBC: 0 % (ref 0.0–0.2)

## 2021-06-19 LAB — COMPREHENSIVE METABOLIC PANEL WITH GFR
ALT: 18 U/L (ref 0–44)
AST: 29 U/L (ref 15–41)
Albumin: 4.3 g/dL (ref 3.5–5.0)
Alkaline Phosphatase: 38 U/L (ref 38–126)
Anion gap: 10 (ref 5–15)
BUN: 33 mg/dL — ABNORMAL HIGH (ref 8–23)
CO2: 28 mmol/L (ref 22–32)
Calcium: 9.4 mg/dL (ref 8.9–10.3)
Chloride: 100 mmol/L (ref 98–111)
Creatinine, Ser: 1.6 mg/dL — ABNORMAL HIGH (ref 0.44–1.00)
GFR, Estimated: 33 mL/min — ABNORMAL LOW (ref >60)
Glucose, Bld: 149 mg/dL — ABNORMAL HIGH (ref 70–99)
Potassium: 4.1 mmol/L (ref 3.5–5.1)
Sodium: 138 mmol/L (ref 135–145)
Total Bilirubin: 1.1 mg/dL (ref 0.3–1.2)
Total Protein: 7.3 g/dL (ref 6.5–8.1)

## 2021-06-19 LAB — URINALYSIS, COMPLETE (UACMP) WITH MICROSCOPIC
Bacteria, UA: NONE SEEN
RBC / HPF: >50 RBC/hpf — ABNORMAL HIGH (ref 0–5)
Specific Gravity, Urine: 1.02 (ref 1.005–1.030)
Squamous Epithelial / HPF: NONE SEEN (ref 0–5)

## 2021-06-19 LAB — RESP PANEL BY RT-PCR (FLU A&B, COVID) ARPGX2
Influenza A by PCR: NEGATIVE
Influenza B by PCR: NEGATIVE
SARS Coronavirus 2 by RT PCR: NEGATIVE

## 2021-06-19 LAB — LIPASE, BLOOD: Lipase: 32 U/L (ref 11–51)

## 2021-06-19 LAB — MAGNESIUM: Magnesium: 2 mg/dL (ref 1.7–2.4)

## 2021-06-19 MED ORDER — ROSUVASTATIN CALCIUM 10 MG PO TABS
20.0000 mg | ORAL_TABLET | Freq: Every day | ORAL | Status: DC
  Administered 2021-06-19: 20 mg via ORAL
  Filled 2021-06-19: qty 1
  Filled 2021-06-19: qty 2

## 2021-06-19 MED ORDER — PREDNISONE 10 MG PO TABS
5.0000 mg | ORAL_TABLET | Freq: Every day | ORAL | Status: DC
Start: 2021-06-20 — End: 2021-06-20

## 2021-06-19 MED ORDER — CEFTRIAXONE SODIUM 1 G IJ SOLR
1.0000 g | INTRAMUSCULAR | Status: DC
Start: 2021-06-20 — End: 2021-06-20
  Administered 2021-06-20: 1 g via INTRAVENOUS
  Filled 2021-06-19: qty 10

## 2021-06-19 MED ORDER — HYDROCODONE-ACETAMINOPHEN 5-325 MG PO TABS: 1.0000 | ORAL_TABLET | Freq: Once | ORAL | Status: DC

## 2021-06-19 MED ORDER — VITAMIN D3 25 MCG (1000 UNIT) PO TABS
500.0000 [IU] | ORAL_TABLET | Freq: Every day | ORAL | Status: DC
  Filled 2021-06-19: qty 0.5

## 2021-06-19 MED ORDER — ACETAMINOPHEN 325 MG PO TABS: 650.0000 mg | ORAL_TABLET | Freq: Four times a day (QID) | ORAL | Status: DC | PRN

## 2021-06-19 MED ORDER — LACTATED RINGERS IV BOLUS
1000.0000 mL | Freq: Once | INTRAVENOUS | Status: AC
  Administered 2021-06-19: 1000 mL via INTRAVENOUS

## 2021-06-19 MED ORDER — POLYVINYL ALCOHOL 1.4 % OP SOLN
1.0000 [drp] | OPHTHALMIC | Status: DC | PRN
  Filled 2021-06-19: qty 15

## 2021-06-19 MED ORDER — ADULT MULTIVITAMIN W/MINERALS CH: 1.0000 | ORAL_TABLET | Freq: Every day | ORAL | Status: DC

## 2021-06-19 MED ORDER — TIOTROPIUM BROMIDE MONOHYDRATE 18 MCG IN CAPS
18.0000 ug | ORAL_CAPSULE | Freq: Every day | RESPIRATORY_TRACT | Status: DC
  Filled 2021-06-19: qty 5

## 2021-06-19 MED ORDER — QUETIAPINE FUMARATE 25 MG PO TABS
25.0000 mg | ORAL_TABLET | Freq: Every day | ORAL | Status: DC
  Administered 2021-06-19: 25 mg via ORAL
  Filled 2021-06-19: qty 1

## 2021-06-19 MED ORDER — VITAMIN B-12 1000 MCG PO TABS: 1000.0000 ug | ORAL_TABLET | Freq: Every day | ORAL | Status: DC

## 2021-06-19 MED ORDER — GABAPENTIN 600 MG PO TABS
600.0000 mg | ORAL_TABLET | Freq: Three times a day (TID) | ORAL | Status: DC | PRN
Start: 2021-06-19 — End: 2021-06-20
  Filled 2021-06-19: qty 1

## 2021-06-19 MED ORDER — MORPHINE SULFATE (PF) 4 MG/ML IV SOLN
4.0000 mg | Freq: Once | INTRAVENOUS | Status: AC
Start: 2021-06-19 — End: 2021-06-19
  Administered 2021-06-19: 4 mg via INTRAVENOUS
  Filled 2021-06-19: qty 1

## 2021-06-19 MED ORDER — ACETAMINOPHEN 650 MG RE SUPP: 650.0000 mg | Freq: Four times a day (QID) | RECTAL | Status: DC | PRN

## 2021-06-19 MED ORDER — HYDROCODONE-ACETAMINOPHEN 5-325 MG PO TABS: 1.0000 | ORAL_TABLET | Freq: Four times a day (QID) | ORAL | Status: DC | PRN

## 2021-06-19 MED ORDER — ONDANSETRON HCL 4 MG/2ML IJ SOLN
4.0000 mg | Freq: Once | INTRAMUSCULAR | Status: AC
Start: 2021-06-19 — End: 2021-06-19

## 2021-06-19 MED ORDER — NICOTINE 21 MG/24HR TD PT24
21.0000 mg | MEDICATED_PATCH | Freq: Every day | TRANSDERMAL | Status: DC
  Administered 2021-06-19 – 2021-06-20 (×2): 21 mg via TRANSDERMAL
  Filled 2021-06-19 (×2): qty 1

## 2021-06-19 MED ORDER — FOLIC ACID 1 MG PO TABS: 1.0000 mg | ORAL_TABLET | Freq: Every day | ORAL | Status: DC

## 2021-06-19 MED ORDER — AZATHIOPRINE 50 MG PO TABS
100.0000 mg | ORAL_TABLET | Freq: Every day | ORAL | Status: DC
  Administered 2021-06-19: 100 mg via ORAL
  Filled 2021-06-19 (×3): qty 2

## 2021-06-19 MED ORDER — POLYETHYLENE GLYCOL 3350 17 G PO PACK: 17.0000 g | PACK | Freq: Every day | ORAL | Status: DC | PRN

## 2021-06-19 MED ORDER — ONDANSETRON HCL 4 MG/2ML IJ SOLN
4.0000 mg | Freq: Four times a day (QID) | INTRAMUSCULAR | Status: DC | PRN
  Administered 2021-06-20: 4 mg via INTRAVENOUS
  Filled 2021-06-19: qty 2

## 2021-06-19 MED ORDER — SODIUM CHLORIDE 0.9 % IV SOLN
1.0000 g | Freq: Once | INTRAVENOUS | Status: AC
  Administered 2021-06-19: 1 g via INTRAVENOUS
  Filled 2021-06-19: qty 10

## 2021-06-19 MED ORDER — SODIUM CHLORIDE 0.9 % IV SOLN: INTRAVENOUS | Status: AC

## 2021-06-19 MED ORDER — LATANOPROST 0.005 % OP SOLN
1.0000 [drp] | Freq: Every day | OPHTHALMIC | Status: DC
  Administered 2021-06-20: 1 [drp] via OPHTHALMIC
  Filled 2021-06-19 (×2): qty 2.5

## 2021-06-19 MED ORDER — ONDANSETRON HCL 4 MG PO TABS: 4.0000 mg | ORAL_TABLET | Freq: Four times a day (QID) | ORAL | Status: DC | PRN

## 2021-06-19 MED ORDER — ONDANSETRON HCL 4 MG/2ML IJ SOLN
INTRAMUSCULAR | Status: AC
Start: 2021-06-19 — End: 2021-06-19
  Administered 2021-06-19: 4 mg via INTRAVENOUS
  Filled 2021-06-19: qty 2

## 2021-06-19 MED ORDER — FLUOXETINE HCL 20 MG PO CAPS
20.0000 mg | ORAL_CAPSULE | Freq: Every day | ORAL | Status: DC
  Administered 2021-06-19: 20 mg via ORAL
  Filled 2021-06-19 (×2): qty 1

## 2021-06-19 MED ORDER — ONDANSETRON 4 MG PO TBDP: 4.0000 mg | ORAL_TABLET | Freq: Once | ORAL | Status: DC

## 2021-06-19 NOTE — ED Provider Notes (Signed)
[facility] Provider Note    Event Date/Time   First MD Initiated Contact with Patient 06/19/21 [phone]     (approximate)   History   Nausea   HPI  [name] is a 81 y.o. female with a past medical history of non-Hodgkin's lymphoma having completed a course of rituximab, COPD, tobacco abuse, anemia and TTP on daily prednisone and Imuran and patient right ureteroscopy with laser lithotripsy and stent placement in the right ureter after having been diagnosed with a kidney stone in distal right ureter as well as transurethral bladder tumor resection and instillation of gemcitabine who presents for evaluation of new onset of right-sided flank pain associate with nausea and vomiting that started early this morning.  Patient states that he still has some blood in her urine but denies any specific burning.  No fevers, change in her chronic cough, chest pain, shortness of breath, left-sided flank or back pain rash or extremity pain.  It seems that the assistance of her daughter ureteral stent was pulled out this morning.  She states they pulled it because "it was sticking out".  I did speak with patient's daughter who confirms the entire stent was removed and the stent itself was not cut and only the string holding it in place.  As reviewed op note yesterday and seemed to have gone smoothly without complication.  Also reviewed prior urology visit notes including on 2/8.      Physical Exam  Triage Vital Signs: ED Triage Vitals  Enc Vitals Group     BP --      Pulse --      Resp --      Temp --      Temp src --      SpO2 --      Weight 06/19/21 0751 171 lb 15.3 oz (78 kg)     Height 06/19/21 0751 5\' 4"  (1.626 m)     Head Circumference --      Peak Flow --      Pain Score 06/19/21 0750 0     Pain Loc --      Pain Edu? --      Excl. in [place]? --     Most recent vital signs: Vitals:   06/19/21 1000 06/19/21 1030  BP: (!) 121/58 (!) 124/50  Pulse: 70 72   Resp: 17 (!) 21  Temp:    SpO2: 93% 93%    General: Awake, appears mildly uncomfortable. CV:  Good peripheral perfusion.  Resp:  Normal effort.  Abd:  No distention.  Soft throughout with some mild right CVA tenderness.  No left-sided CVA tenderness. Other:     ED Results / Procedures / Treatments  Labs (all labs ordered are listed, but only abnormal results are displayed) Labs Reviewed  CBC WITH DIFFERENTIAL/PLATELET - Abnormal; Notable for the following components:      Result Value   WBC 14.7 (*)    Hemoglobin 15.9 (*)    HCT 48.2 (*)    Platelets 89 (*)    Neutro Abs 12.5 (*)    Monocytes Absolute 1.3 (*)    Abs Immature Granulocytes 0.09 (*)    All other components within normal limits  COMPREHENSIVE METABOLIC PANEL - Abnormal; Notable for the following components:   Glucose, Bld 149 (*)    BUN 33 (*)    Creatinine, Ser 1.60 (*)    GFR, Estimated 33 (*)    All other components within normal  limits  URINALYSIS, COMPLETE (UACMP) WITH MICROSCOPIC - Abnormal; Notable for the following components:   Color, Urine RED (*)    APPearance TURBID (*)    Glucose, UA   (*)    Value: TEST NOT REPORTED DUE TO COLOR INTERFERENCE OF URINE PIGMENT   Hgb urine dipstick   (*)    Value: TEST NOT REPORTED DUE TO COLOR INTERFERENCE OF URINE PIGMENT   Bilirubin Urine   (*)    Value: TEST NOT REPORTED DUE TO COLOR INTERFERENCE OF URINE PIGMENT   Ketones, ur   (*)    Value: TEST NOT REPORTED DUE TO COLOR INTERFERENCE OF URINE PIGMENT   Protein, ur   (*)    Value: TEST NOT REPORTED DUE TO COLOR INTERFERENCE OF URINE PIGMENT   Nitrite   (*)    Value: TEST NOT REPORTED DUE TO COLOR INTERFERENCE OF URINE PIGMENT   Leukocytes,Ua   (*)    Value: TEST NOT REPORTED DUE TO COLOR INTERFERENCE OF URINE PIGMENT   RBC / HPF >50 (*)    All other components within normal limits  URINE CULTURE  CULTURE, BLOOD (ROUTINE X 2)  CULTURE, BLOOD (ROUTINE X 2)  RESP PANEL BY RT-PCR (FLU A&B, COVID) ARPGX2   LIPASE, BLOOD  MAGNESIUM     EKG  EKG is remarkable for sinus rhythm with PVC, unremarkable intervals, normal axis without clear evidence of acute ischemia.  Some borderline T wave changes in lateral leads specifically aVL is very similar to that seen on ECG from 2/15   RADIOLOGY  CT abdomen pelvis on my interpretation says some right-sided perinephric stranding without obvious hematoma.  Do not see any stent.  I also reviewed radiology interpretation and agree with the findings of some right-sided perinephric stranding and hydronephrosis with no change of the known left lower renal pole.  A little bit of gas within the right renal pelvis likely from recent surgery and incidental pulmonary nodules were noted as well as unchanged density in the left adnexa.  No other clear acute process.   PROCEDURES:  Critical Care performed: No  .1-3 Lead EKG Interpretation Performed by: Lucrezia Starch, MD Authorized by: Lucrezia Starch, MD     Interpretation: normal     ECG rate assessment: normal     Rhythm: sinus rhythm     Ectopy: none     Conduction: normal    The patient is on the cardiac monitor to evaluate for evidence of arrhythmia and/or significant heart rate changes.   MEDICATIONS ORDERED IN ED: Medications  cefTRIAXone (ROCEPHIN) 1 g in sodium chloride 0.9 % 100 mL IVPB (1 g Intravenous New Bag/Given 06/19/21 1033)  lactated ringers bolus 1,000 mL (0 mLs Intravenous Stopped 06/19/21 1022)  morphine (PF) 4 MG/ML injection 4 mg (4 mg Intravenous Given 06/19/21 0824)  ondansetron (ZOFRAN) injection 4 mg (4 mg Intravenous Given 06/19/21 1950)     IMPRESSION / MDM / ASSESSMENT AND PLAN / ED COURSE  I reviewed the triage vital signs and the nursing notes.                              Differential diagnosis includes, but is not limited to symptoms related to ureteral and bladder irritation from recent procedure, pyelonephritis, hematoma, atypical ACS, metabolic derangements,  possible stone fragments or cystitis.  EKG is remarkable forsinus rhythm with PVC, unremarkable intervals, normal axis without clear evidence of acute ischemia. Some borderline  T wave changes in lateral leads specifically aVL is very similar to that seen on ECG from 2/15.   CT abdomen pelvis on my interpretation says some right-sided perinephric stranding without obvious hematoma.  Do not see any stent.  I also reviewed radiology interpretation and agree with the findings of some right-sided perinephric stranding and hydronephrosis with no change of the known left lower renal pole.  A little bit of gas within the right renal pelvis likely from recent surgery and incidental pulmonary nodules were noted as well as unchanged density in the left adnexa.  No other clear acute process.  CMP shows a creatinine of 1.6 compared to 1.32 weeks ago.  No other significant acute electrolyte or metabolic derangements.  CBC shows WBC count of 14.7 without evidence of acute anemia and platelets of 89 compared to 103 days ago.  UA is very mild interference but no WBCs or bacteria noted.  Lipase not consistent with pancreatitis.  Magnesium is unremarkable.  Impression is right-sided pyelonephritis given CVA tenderness with perinephric stranding on CT and leukocytosis.  No white blood cells in urine on bacterial palpation is fairly immunosuppressed and I discussed her presentation with on-call urologist Dr. Gloriann Loan.  He recommended observation on hospitalist service given immunosuppressed status and age and recent surgery.  I think this is reasonable.  I will admit to hospitalist service will place admission orders.      FINAL CLINICAL IMPRESSION(S) / ED DIAGNOSES   Final diagnoses:  Nausea and vomiting, unspecified vomiting type  Flank pain  Pyelonephritis  Thrombocytopenia ([place])     Rx / DC Orders   ED Discharge Orders     None        Note:  This document was prepared using Dragon voice recognition  software and may include unintentional dictation errors.   Lucrezia Starch, MD 06/19/21 1052

## 2021-06-19 NOTE — ED Notes (Signed)
RN to bedside to introduce self to patient. Pt is CAOx4. She feels like "I am having a kidney stone attack". Pt advised her daughter helped her cut some sort of blue thing out of her urethra out.

## 2021-06-19 NOTE — ED Notes (Signed)
RN KB went to bedside to place pt on 2liters of Oxygen due to saturation dropped to 83-85% on RA. Pt improved to 96% on 2 liters.

## 2021-06-19 NOTE — Care Management Important Message (Signed)
Important Message  Patient Details  Name: [name] MRN: [id number] Date of Birth: Jun 06, 1940   Medicare Important Message Given:  Yes     Elliot Gurney [place], [place] 06/19/2021, 12:48 PM

## 2021-06-19 NOTE — Care Management Obs Status (Signed)
[place] NOTIFICATION   Patient Details  Name: [name] MRN: [id number] Date of Birth: 01-01-1941   Medicare Observation Status Notification Given:       Ruby Cola 06/19/2021, 12:48 PM

## 2021-06-19 NOTE — Consult Note (Signed)
H&P Physician requesting consult: Collier Bullock, MD   Chief Complaint: Right flank pain status post right ureteroscopy  History of Present Illness: 81 year old female status post right ureteroscopy with laser lithotripsy of distal ureteral stone, right ureteroscopy and laser lithotripsy of renal stone, right ureteral stent placement, and bladder biopsy and fulguration with instillation of gemcitabine.  Her stent was removed today.  She subsequently developed right-sided flank pain and some nausea/vomiting.  She was noted to have an AKI with a creatinine of 1.6, and mild leukocytosis 14.7.  She has remained afebrile with vital signs stable.  She is now resting comfortably with no complaints.  Denies any fever, chill, nausea, vomiting currently.  Wants to go home when she can.  CT scan was performed that revealed right-sided hydroureteronephrosis with perinephric and inflammatory stranding with no obstructing stone in the ureter.  Tiny gas locule in the right renal pelvis likely secondary to recent instrumentation.  There was no stent present.  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   History of TTP (thrombotic thrombocytopenic purpura)    Hypokalemia 08/08/2013   Steroid related   Normal echocardiogram 03/15/2011   Oral thrush 08/08/2013   Septicemia 03/2011   Spinal stenosis, lumbar    Tobacco abuse 05/10/2013   Past Surgical History:  Procedure Laterality Date   BACK SURGERY     BLADDER INSTILLATION N/A 06/18/2021   Procedure: BLADDER  INSTILLATION OF GEMCITABINE;  Surgeon: Billey Co, MD;  Location: [facility];  Service: Urology;  Laterality: N/A;   CATARACT EXTRACTION W/ INTRAOCULAR LENS  IMPLANT, BILATERAL  ~ 2010   CESAREAN SECTION  10/09/1976   CYSTOSCOPY/URETEROSCOPY/HOLMIUM LASER/STENT PLACEMENT Right 06/18/2021   Procedure: CYSTOSCOPY/URETEROSCOPY/HOLMIUM LASER/STENT PLACEMENT;  Surgeon: Billey Co, MD;  Location: [facility];  Service: Urology;  Laterality: Right;   ESOPHAGOGASTRODUODENOSCOPY (EGD) WITH PROPOFOL N/A 09/27/2020   Procedure: ESOPHAGOGASTRODUODENOSCOPY (EGD) WITH PROPOFOL;  Surgeon: Ronald Lobo, MD;  Location: [facility];  Service: Endoscopy;  Laterality: N/A;   EYE SURGERY     INSERTION OF DIALYSIS CATHETER Left 07/12/2013   Procedure: INSERTION OF DIALYSIS CATHETER   with Ultrasound;  Surgeon: Rosetta Posner, MD;  Location: [facility] [place];  Service: Vascular;  Laterality: Left;   IR PERC TUN PERIT CATH WO PORT S&I /IMAG  07/16/2020   IR RADIOLOGIST EVAL & MGMT  09/26/2020   IR US GUIDE VASC ACCESS LEFT  07/16/2020   LUMBAR LAMINECTOMY/DECOMPRESSION MICRODISCECTOMY  03/16/2011   Procedure: LUMBAR LAMINECTOMY/DECOMPRESSION MICRODISCECTOMY;  Surgeon: Elaina Hoops;  Location: [facility];  Service: Neurosurgery;  Laterality: N/A;   TEE WITHOUT CARDIOVERSION  03/21/2011   Procedure: TRANSESOPHAGEAL ECHOCARDIOGRAM (TEE);  Surgeon: Laverda Page;  Location: [facility];  Service: Cardiovascular;  Laterality: N/A;  TEE for vegetations   TONSILLECTOMY     TRANSURETHRAL RESECTION OF BLADDER TUMOR N/A 06/18/2021   Procedure: TRANSURETHRAL RESECTION OF BLADDER TUMOR (TURBT);  Surgeon: Billey Co, MD;  Location: [facility];  Service:  Urology;  Laterality: N/A;   TUBAL LIGATION      Home Medications:  Medications Prior to Admission  Medication Sig Dispense Refill Last Dose   azaTHIOprine (IMURAN) 50 MG tablet Take 2 tablets (100 mg total) by mouth daily. 60 tablet 0 06/18/2021 at AM   FLUoxetine  (PROZAC) 20 MG capsule Take 20 mg by mouth daily.   06/18/2021 at AM   gabapentin (NEURONTIN) 600 MG tablet Take 600 mg by mouth 3 (three) times daily as needed (pain).   06/18/2021 at NIGHT   HYDROcodone-acetaminophen (NORCO/VICODIN) 5-325 MG tablet Take 1-2 tablets by mouth every 6 (six) hours as needed for up to 5 days for moderate pain. 8 tablet 0 06/18/2021 at NIGHT   latanoprost (XALATAN) 0.005 % ophthalmic solution Place 1 drop into both eyes.   06/18/2021   polyvinyl alcohol (LIQUIFILM TEARS) 1.4 % ophthalmic solution Place 1 drop into both eyes as needed for dry eyes.   06/18/2021 at AM   predniSONE (DELTASONE) 5 MG tablet Take 1 tablet (5 mg total) by mouth daily with breakfast. 30 tablet 3 06/18/2021 at AM   QUEtiapine (SEROQUEL) 25 MG tablet Take 25 mg by mouth at bedtime.   06/17/2021   rosuvastatin (CRESTOR) 20 MG tablet Take 20 mg by mouth at bedtime.   06/18/2021 at NIGHT   tiotropium (SPIRIVA) 18 MCG inhalation capsule Place 18 mcg into inhaler and inhale daily.   06/18/2021 at AM   traMADol (ULTRAM) 50 MG tablet Take 1 tablet (50 mg total) by mouth 2 (two) times daily. (Patient taking differently: Take 50 mg by mouth every 6 (six) hours as needed for moderate pain.)   06/18/2021 at NIGHT   cholecalciferol (VITAMIN D) 400 UNITS TABS Take 400 Units by mouth daily.   06/17/2021   cyanocobalamin 1000 MCG tablet Take 1,000 mcg by mouth daily.      folic acid (FOLVITE) 1 MG tablet Take 1 mg by mouth daily.   06/17/2021   Multiple Vitamin (MULTIVITAMIN) tablet Take 1 tablet by mouth daily.    06/17/2021   Nicotine (NICODERM CQ TD) Place 1 patch onto the skin daily as needed (when in hospital). (Patient not taking: Reported on 06/16/2021)      nitrofurantoin (MACRODANTIN) 50 MG capsule Take 1 capsule (50 mg total) by mouth daily for 6 days. 6 capsule 0    polyethylene glycol (MIRALAX / GLYCOLAX) packet Take 17 g by mouth daily. (Patient taking differently: Take 17 g by mouth daily as needed for moderate  constipation.) 14 each 0 PRN at PRN   Allergies:  Allergies  Allergen Reactions   Adhesive [Tape] Other (See Comments)    Blisters, Band-Aids brand rips off the skin- paper tape only!!   Cefuroxime Axetil Anxiety and Other (See Comments)    Made the patient jittery   Cefuroxime Axetil Anxiety and Other (See Comments)    Shaky, amoxicillin ok Made the patient jittery   Pedi-Pre Tape Spray [Wound Dressing Adhesive] Other (See Comments)    Blisters, band-aide brand  Blisters, Band-Aids brand rips off the skin- paper tape only!!   Other Hives and Other (See Comments)    Wool: Reaction is hives Statistician tape: Reaction is blistering    Sulfa Antibiotics Hives   Sulfa Drugs Cross Reactors Hives    Family History  Problem Relation Age of Onset   Heart disease Mother    Leukemia Sister    Breast cancer Neg Hx    Colon cancer Neg  Hx    Ovarian cancer Neg Hx    Uterine cancer Neg Hx    Cervical cancer Neg Hx    Social History:  reports that she has been smoking cigarettes. She has a 165.00 pack-year smoking history. She has never been exposed to tobacco smoke. She has never used smokeless tobacco. She reports current alcohol use of about 7.0 standard drinks per week. She reports that she does not use drugs.  ROS: A complete review of systems was performed.  All systems are negative except for pertinent findings as noted. ROS   Physical Exam:  Vital signs in last 24 hours: Temp:  [97.9 F (36.6 C)-98.1 F (36.7 C)] 98.1 F (36.7 C) (02/18 1418) Pulse Rate:  [66-77] 70 (02/18 1418) Resp:  [16-21] 18 (02/18 1418) BP: (116-139)/(46-93) 125/46 (02/18 1418) SpO2:  [90 %-99 %] 96 % (02/18 1418) Weight:  [78 kg] 78 kg (02/18 0751) General:  Alert and oriented, No acute distress HEENT: Normocephalic, atraumatic Neck: No JVD or lymphadenopathy Cardiovascular: Regular rate and rhythm Lungs: Regular rate and effort Abdomen: Soft, nontender, nondistended, no  abdominal masses Back: No CVA tenderness Extremities: No edema Neurologic: Grossly intact  Laboratory Data:  Results for orders placed or performed during the hospital encounter of 06/19/21 (from the past 24 hour(s))  CBC with Differential     Status: Abnormal   Collection Time: 06/19/21  8:14 AM  Result Value Ref Range   WBC 14.7 (H) 4.0 - 10.5 K/uL   RBC 4.92 3.87 - 5.11 MIL/uL   Hemoglobin 15.9 (H) 12.0 - 15.0 g/dL   HCT 48.2 (H) 36.0 - 46.0 %   MCV 98.0 80.0 - 100.0 fL   MCH 32.3 26.0 - 34.0 pg   MCHC 33.0 30.0 - 36.0 g/dL   RDW 13.2 11.5 - 15.5 %   Platelets 89 (L) 150 - 400 K/uL   nRBC 0.0 0.0 - 0.2 %   Neutrophils Relative % 84 %   Neutro Abs 12.5 (H) 1.7 - 7.7 K/uL   Lymphocytes Relative 6 %   Lymphs Abs 0.8 0.7 - 4.0 K/uL   Monocytes Relative 9 %   Monocytes Absolute 1.3 (H) 0.1 - 1.0 K/uL   Eosinophils Relative 0 %   Eosinophils Absolute 0.0 0.0 - 0.5 K/uL   Basophils Relative 0 %   Basophils Absolute 0.0 0.0 - 0.1 K/uL   WBC Morphology MORPHOLOGY UNREMARKABLE    RBC Morphology MORPHOLOGY UNREMARKABLE    Smear Review PLATELETS APPEAR DECREASED    Immature Granulocytes 1 %   Abs Immature Granulocytes 0.09 (H) 0.00 - 0.07 K/uL  Comprehensive metabolic panel     Status: Abnormal   Collection Time: 06/19/21  8:14 AM  Result Value Ref Range   Sodium 138 135 - 145 mmol/L   Potassium 4.1 3.5 - 5.1 mmol/L   Chloride 100 98 - 111 mmol/L   CO2 28 22 - 32 mmol/L   Glucose, Bld 149 (H) 70 - 99 mg/dL   BUN 33 (H) 8 - 23 mg/dL   Creatinine, Ser 1.60 (H) 0.44 - 1.00 mg/dL   Calcium 9.4 8.9 - 10.3 mg/dL   Total Protein 7.3 6.5 - 8.1 g/dL   Albumin 4.3 3.5 - 5.0 g/dL   AST 29 15 - 41 U/L   ALT 18 0 - 44 U/L   Alkaline Phosphatase 38 38 - 126 U/L   Total Bilirubin 1.1 0.3 - 1.2 mg/dL   GFR, Estimated 33 (L) >60 mL/min  Anion gap 10 5 - 15  Urinalysis, Complete w Microscopic     Status: Abnormal   Collection Time: 06/19/21  8:14 AM  Result Value Ref Range   Color,  Urine RED (A) YELLOW   APPearance TURBID (A) CLEAR   Specific Gravity, Urine 1.020 1.005 - 1.030   pH  5.0 - 8.0    TEST NOT REPORTED DUE TO COLOR INTERFERENCE OF URINE PIGMENT   Glucose, UA (A) NEGATIVE mg/dL    TEST NOT REPORTED DUE TO COLOR INTERFERENCE OF URINE PIGMENT   Hgb urine dipstick (A) NEGATIVE    TEST NOT REPORTED DUE TO COLOR INTERFERENCE OF URINE PIGMENT   Bilirubin Urine (A) NEGATIVE    TEST NOT REPORTED DUE TO COLOR INTERFERENCE OF URINE PIGMENT   Ketones, ur (A) NEGATIVE mg/dL    TEST NOT REPORTED DUE TO COLOR INTERFERENCE OF URINE PIGMENT   Protein, ur (A) NEGATIVE mg/dL    TEST NOT REPORTED DUE TO COLOR INTERFERENCE OF URINE PIGMENT   Nitrite (A) NEGATIVE    TEST NOT REPORTED DUE TO COLOR INTERFERENCE OF URINE PIGMENT   Leukocytes,Ua (A) NEGATIVE    TEST NOT REPORTED DUE TO COLOR INTERFERENCE OF URINE PIGMENT   RBC / HPF >50 (H) 0 - 5 RBC/hpf   WBC, UA 0-5 0 - 5 WBC/hpf   Bacteria, UA NONE SEEN NONE SEEN   Squamous Epithelial / LPF NONE SEEN 0 - 5  Lipase, blood     Status: None   Collection Time: 06/19/21  8:14 AM  Result Value Ref Range   Lipase 32 11 - 51 U/L  Magnesium     Status: None   Collection Time: 06/19/21  8:14 AM  Result Value Ref Range   Magnesium 2.0 1.7 - 2.4 mg/dL  Resp Panel by RT-PCR (Flu A&B, Covid) Nasopharyngeal Swab     Status: None   Collection Time: 06/19/21 10:23 AM   Specimen: Nasopharyngeal Swab; Nasopharyngeal(NP) swabs in vial transport medium  Result Value Ref Range   SARS Coronavirus 2 by RT PCR NEGATIVE NEGATIVE   Influenza A by PCR NEGATIVE NEGATIVE   Influenza B by PCR NEGATIVE NEGATIVE   Recent Results (from the past 240 hour(s))  Urine culture     Status: Abnormal   Collection Time: 06/11/21  3:58 PM   Specimen: Urine, Random  Result Value Ref Range Status   Specimen Description   Final    URINE, RANDOM Performed at [facility], [address] [place], [place] [address]    Special Requests   Final     NONE Performed at [facility], [address], [place], [place] [address]    Culture MULTIPLE SPECIES PRESENT, SUGGEST RECOLLECTION (A)  Final   Report Status 06/13/2021 FINAL  Final  Resp Panel by RT-PCR (Flu A&B, Covid) Nasopharyngeal Swab     Status: None   Collection Time: 06/19/21 10:23 AM   Specimen: Nasopharyngeal Swab; Nasopharyngeal(NP) swabs in vial transport medium  Result Value Ref Range Status   SARS Coronavirus 2 by RT PCR NEGATIVE NEGATIVE Final    Comment: (NOTE) SARS-CoV-2 target nucleic acids are NOT DETECTED.  The SARS-CoV-2 RNA is generally detectable in upper respiratory specimens during the acute phase of infection. The lowest concentration of SARS-CoV-2 viral copies this assay can detect is 138 copies/mL. A negative result does not preclude SARS-Cov-2 infection and should not be used as the sole basis for treatment or other patient management decisions. A negative result  may occur with  improper specimen collection/handling, submission of specimen other than nasopharyngeal swab, presence of viral mutation(s) within the areas targeted by this assay, and inadequate number of viral copies(<138 copies/mL). A negative result must be combined with clinical observations, patient history, and epidemiological information. The expected result is Negative.  Fact Sheet for Patients:  EntrepreneurPulse.com.au  Fact Sheet for Healthcare Providers:  IncredibleEmployment.be  This test is no t yet approved or cleared by the [place] FDA and  has been authorized for detection and/or diagnosis of SARS-CoV-2 by FDA under an Emergency Use Authorization (EUA). This EUA will remain  in effect (meaning this test can be used) for the duration of the COVID-19 declaration under Section 564(b)(1) of the Act, 21 U.S.C.section 360bbb-3(b)(1), unless the authorization is terminated  or revoked sooner.       Influenza A by PCR  NEGATIVE NEGATIVE Final   Influenza B by PCR NEGATIVE NEGATIVE Final    Comment: (NOTE) The Xpert Xpress SARS-CoV-2/FLU/RSV plus assay is intended as an aid in the diagnosis of influenza from Nasopharyngeal swab specimens and should not be used as a sole basis for treatment. Nasal washings and aspirates are unacceptable for Xpert Xpress SARS-CoV-2/FLU/RSV testing.  Fact Sheet for Patients: EntrepreneurPulse.com.au  Fact Sheet for Healthcare Providers: IncredibleEmployment.be  This test is not yet approved or cleared by the [place] FDA and has been authorized for detection and/or diagnosis of SARS-CoV-2 by FDA under an Emergency Use Authorization (EUA). This EUA will remain in effect (meaning this test can be used) for the duration of the COVID-19 declaration under Section 564(b)(1) of the Act, 21 U.S.C. section 360bbb-3(b)(1), unless the authorization is terminated or revoked.  Performed at [facility], [address] [place],  [address]    Creatinine: Recent Labs    06/19/21 1683  CREATININE 1.60*    Impression/Assessment:  Right hydronephrosis Right flank pain Possible UTI  Plan:  Agree with keeping overnight for observation given her leukocytosis and presentation 1 day post ureteroscopy.  She is already much improved and reasonable to discharge in the morning if she continues to do this well.  Would recommend 7-day course of Keflex.  Marton Redwood, III 06/19/2021, 7:48 PM

## 2021-06-19 NOTE — ED Triage Notes (Addendum)
Pt reports had a cystoscopy yesterday and started with really bad nausea that isn't going away and is still having blood in her urine. Pt reports she is not in pain but does feel like she is having a kidney stone attack.

## 2021-06-19 NOTE — H&P (Addendum)
History and Physical    Patient: [name] DOB: 1940-06-02 DOA: 06/19/2021 DOS: the patient was seen and examined on 06/19/2021 PCP: Kirk Ruths, MD  Patient coming from: Home  Chief Complaint:  Chief Complaint  Patient presents with   Nausea    HPI: [name] is a 81 y.o. female with medical history significant for nephrolithiasis status post lithotripsy and right ureteral stent placement, status post recent bladder biopsy and fulguration, history of TTP, nicotine dependence, COPD and depression who presents to the ER for evaluation of right flank pain. Patient's procedure was on 06/18/21 and she was discharged home with some hydrocodone to take as needed for pain.  Husband states that she took 1 tablet immediately after that he got home but overnight patient developed severe right flank pain which she rated an 8 x 10 in intensity at its worst.  Pain was nonradiating and was associated with nausea.  She states that this pain feels different from when she has passed the kidney stone in the past.  She has hematuria which is expected but denies having any fever, no chills, no emesis, no dysuria, no cough, no shortness of breath, no chest pain, no blurred vision, no focal deficits, no palpitations, no lower extremity swelling. At the time of my evaluation abdominal pain has improved. Imaging shows right-sided hydroureteronephrosis with perinephric inflammatory stranding.  No obstructing stone is visible within the ureter.  No stent is noted in the ureter as well.  Stent appears to have dislodged. She will be referred to observation status for further evaluation. Urologist on-call was contacted by the emergency room physician.  Review of Systems: As mentioned in the history of present illness. All other systems reviewed and are negative. 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   History of TTP (thrombotic thrombocytopenic purpura)    Hypokalemia 08/08/2013   Steroid related   Normal echocardiogram 03/15/2011   Oral thrush 08/08/2013   Septicemia 03/2011   Spinal stenosis, lumbar    Tobacco abuse 05/10/2013   Past Surgical History:  Procedure Laterality Date   BACK SURGERY     BLADDER INSTILLATION N/A 06/18/2021   Procedure: BLADDER INSTILLATION OF GEMCITABINE;  Surgeon: Billey Co, MD;  Location: [facility] ORS;  Service: Urology;  Laterality: N/A;   CATARACT EXTRACTION W/ INTRAOCULAR LENS  IMPLANT, BILATERAL  ~ 2010   CESAREAN SECTION  10/09/1976   CYSTOSCOPY/URETEROSCOPY/HOLMIUM LASER/STENT PLACEMENT Right 06/18/2021   Procedure: CYSTOSCOPY/URETEROSCOPY/HOLMIUM LASER/STENT PLACEMENT;  Surgeon: Billey Co, MD;  Location: [facility];  Service: Urology;  Laterality: Right;   ESOPHAGOGASTRODUODENOSCOPY (EGD) WITH PROPOFOL N/A 09/27/2020   Procedure: ESOPHAGOGASTRODUODENOSCOPY (EGD) WITH PROPOFOL;  Surgeon: Ronald Lobo, MD;  Location: [facility];  Service: Endoscopy;  Laterality: N/A;   EYE SURGERY     INSERTION OF DIALYSIS CATHETER Left 07/12/2013   Procedure: INSERTION OF DIALYSIS CATHETER   with Ultrasound;  Surgeon: Rosetta Posner, MD;  Location: [facility];  Service: Vascular;  Laterality: Left;   IR PERC TUN PERIT CATH WO PORT S&I /IMAG  07/16/2020   IR RADIOLOGIST EVAL & MGMT  09/26/2020   IR US GUIDE VASC ACCESS LEFT  07/16/2020   LUMBAR LAMINECTOMY/DECOMPRESSION MICRODISCECTOMY  03/16/2011   Procedure: LUMBAR LAMINECTOMY/DECOMPRESSION MICRODISCECTOMY;  Surgeon: Elaina Hoops;   Location: [place] [facility];  Service: Neurosurgery;  Laterality: N/A;   TEE WITHOUT CARDIOVERSION  03/21/2011   Procedure: TRANSESOPHAGEAL ECHOCARDIOGRAM (TEE);  Surgeon: Laverda Page;  Location: [facility] ENDOSCOPY;  Service: Cardiovascular;  Laterality: N/A;  TEE for vegetations   TONSILLECTOMY     TRANSURETHRAL RESECTION OF BLADDER TUMOR N/A 06/18/2021   Procedure: TRANSURETHRAL RESECTION OF BLADDER TUMOR (TURBT);  Surgeon: Billey Co, MD;  Location: [facility];  Service: Urology;  Laterality: N/A;   TUBAL LIGATION     Social History:  reports that she has been smoking cigarettes. She has a 165.00 pack-year smoking history. She has never been exposed to tobacco smoke. She has never used smokeless tobacco. She reports current alcohol use of about 7.0 standard drinks per week. She reports that she does not use drugs.  Allergies  Allergen Reactions   Adhesive [Tape] Other (See Comments)    Blisters, Band-Aids brand rips off the skin- paper tape only!!   Cefuroxime Axetil Anxiety and Other (See Comments)    Made the patient jittery   Cefuroxime Axetil Anxiety and Other (See Comments)    Shaky, amoxicillin ok Made the patient jittery   Pedi-Pre Tape Spray [Wound Dressing Adhesive] Other (See Comments)    Blisters, band-aide brand  Blisters, Band-Aids brand rips off the skin- paper tape only!!   Other Hives and Other (See Comments)    Wool: Reaction is hives Statistician tape: Reaction is blistering    Sulfa Antibiotics Hives   Sulfa Drugs Cross Reactors Hives    Family History  Problem Relation Age of Onset   Heart disease Mother    Leukemia Sister    Breast cancer Neg Hx    Colon cancer Neg Hx    Ovarian cancer Neg Hx    Uterine cancer Neg Hx    Cervical cancer Neg Hx     Prior to Admission medications   Medication Sig Start Date End Date Taking? Authorizing Provider  azaTHIOprine (IMURAN) 50 MG tablet Take 2 tablets (100 mg total) by mouth daily. 03/12/21  Yes  [name], NP  FLUoxetine (PROZAC) 20 MG capsule Take 20 mg by mouth daily.   Yes Kirk Ruths, MD  gabapentin (NEURONTIN) 600 MG tablet Take 600 mg by mouth 3 (three) times daily as needed (pain).   Yes [provider]  HYDROcodone-acetaminophen (NORCO/VICODIN) 5-325 MG tablet Take 1-2 tablets by mouth every 6 (six) hours as needed for up to 5 days for moderate pain. 06/18/21 06/23/21 Yes Billey Co, MD  latanoprost (XALATAN) 0.005 % ophthalmic solution Place 1 drop into both eyes. 05/31/21  Yes [provider]  polyvinyl alcohol (LIQUIFILM TEARS) 1.4 % ophthalmic solution Place 1 drop into both eyes as needed for dry eyes.   Yes [provider]  predniSONE (DELTASONE) 5 MG tablet Take 1 tablet (5 mg total) by mouth daily with breakfast. 05/26/21  Yes Ladell Pier, MD  QUEtiapine (SEROQUEL) 25 MG tablet Take 25 mg by mouth at bedtime.   Yes [provider]  rosuvastatin (CRESTOR) 20 MG tablet Take 20 mg by mouth at bedtime. 05/22/20  Yes [provider]  tiotropium (SPIRIVA) 18 MCG inhalation capsule Place 18 mcg into inhaler and inhale daily.   Yes [provider]  traMADol (ULTRAM) 50 MG tablet Take 1 tablet (50 mg total) by mouth 2 (two) times daily. Patient taking differently: Take 50 mg by mouth every 6 (six) hours as needed for moderate pain. 09/27/20  Yes Samuella Cota, MD  cholecalciferol (VITAMIN D) 400 UNITS TABS Take 400 Units by mouth daily.    [provider]  cyanocobalamin 1000 MCG tablet Take 1,000 mcg by mouth daily.    [provider]  folic acid (FOLVITE) 1 MG tablet Take 1 mg by mouth daily.    [provider]  Multiple Vitamin (MULTIVITAMIN) tablet Take 1 tablet by mouth daily.     [provider]  Nicotine (NICODERM CQ TD) Place 1 patch onto the skin daily as needed (when in hospital). Patient not taking: Reported on 06/16/2021    [provider]   nitrofurantoin (MACRODANTIN) 50 MG capsule Take 1 capsule (50 mg total) by mouth daily for 6 days. 06/18/21 06/24/21  Billey Co, MD  polyethylene glycol (MIRALAX / Floria Raveling) packet Take 17 g by mouth daily. Patient taking differently: Take 17 g by mouth daily as needed for moderate constipation. 04/08/17   Earleen Newport, MD    Physical Exam: Vitals:   06/19/21 0800 06/19/21 0940 06/19/21 1000 06/19/21 1030  BP: (!) 123/51 (!) 139/58 (!) 121/58 (!) 124/50  Pulse: 75 77 70 72  Resp:  19 17 (!) 21  Temp:      TempSrc:      SpO2: 95% 90% 93% 93%  Weight:      Height:       Physical Exam Vitals and nursing note reviewed.  Constitutional:      Appearance: Normal appearance. She is normal weight.  HENT:     Head: Normocephalic and atraumatic.     Nose: Nose normal.     Mouth/Throat:     Mouth: Mucous membranes are moist.  Eyes:     Pupils: Pupils are equal, round, and reactive to light.  Cardiovascular:     Rate and Rhythm: Normal rate and regular rhythm.  Pulmonary:     Effort: Pulmonary effort is normal.     Breath sounds: Normal breath sounds.  Abdominal:     General: Abdomen is flat. Bowel sounds are normal.     Palpations: Abdomen is soft.     Comments: No CVA tenderness  Musculoskeletal:        General: Normal range of motion.     Cervical back: Normal range of motion and neck supple.  Skin:    General: Skin is warm and dry.  Neurological:     General: No focal deficit present.     Mental Status: She is alert and oriented to person, place, and time.  Psychiatric:        Mood and Affect: Mood normal.        Behavior: Behavior normal.     Data Reviewed: Notes from primary care and specialist visits, past discharge summaries. Prior diagnostic testing as applicable to current admission diagnoses Updated medications and problem lists for reconciliation ED course, including vitals, labs, imaging, treatment and response to treatment Triage notes and ED  providers notes Labs reviewed and patient marked leukocytosis, platelet count 89,000, BUN 33, creatinine 1.60 compared to baseline of 1.31. Twelve-lead EKG reviewed by me shows sinus rhythm with PVCs. CT scan of abdomen and  pelvis shows right-sided hydroureteronephrosis with perinephric and perinephric inflammatory stranding.  No obstructing stone is visible within the ureter.  Small focus of gas within the right renal pelvis, likely related to recent retrograde pyelogram.  There is no stent in the ureter.  Recommend urology consultation. 2 incidental 5 mm pulmonary nodules in the lung bases.  Noncontrast CT should be considered in 12 months if patient is high risk. There are no new results to review at this time.  Assessment and Plan: Principal Problem:   Acute pyelonephritis Active Problems:   History of TTP (thrombotic thrombocytopenic purpura)   COPD (chronic obstructive pulmonary disease) (HCC)   Nicotine dependence   Nephrolithiasis     Pyelonephritis Following recent procedure Patient is status post right ureteroscopy with laser lithotripsy of distal ureteral stone as well as right ureteral stent placement. She presents for evaluation of severe right flank pain and imaging shows perinephric stranding We will treat empirically with IV Rocephin Pain control    Nicotine dependence Patient smokes 2 packs of cigarettes daily Smoking cessation has been discussed with her in detail We will place her on nicotine transdermal patch 21 mg daily     History of TTP Continue prednisone and azathioprine    COPD Not acutely exacerbated Continue as needed bronchodilator therapy and Spiriva    Depression Continue fluoxetine and Seroquel      Advance Care Planning:   Code Status: Full Code   Consults: Urology  Family Communication: Greater than 50% of time was spent discussing patient's condition and plan of care with her and her husband at the bedside.  All questions  and concerns have been addressed.  They verbalized understanding and agree with the plan.  Severity of Illness: The appropriate patient status for this patient is OBSERVATION. Observation status is judged to be reasonable and necessary in order to provide the required intensity of service to ensure the patient's safety. The patient's presenting symptoms, physical exam findings, and initial radiographic and laboratory data in the context of their medical condition is felt to place them at decreased risk for further clinical deterioration. Furthermore, it is anticipated that the patient will be medically stable for discharge from the hospital within 2 midnights of admission.   Author: Collier Bullock, MD 06/19/2021 12:18 PM  For on call review [email or web address].

## 2021-06-19 NOTE — Plan of Care (Signed)
Pt educated on care plan

## 2021-06-20 ENCOUNTER — Encounter: Payer: Self-pay | Admitting: Internal Medicine

## 2021-06-20 ENCOUNTER — Encounter (HOSPITAL_COMMUNITY): Payer: Self-pay | Admitting: Hematology & Oncology

## 2021-06-20 ENCOUNTER — Observation Stay: Payer: Medicare Other

## 2021-06-20 ENCOUNTER — Other Ambulatory Visit: Payer: Self-pay

## 2021-06-20 ENCOUNTER — Observation Stay: Admit: 2021-06-20 | Payer: Medicare Other

## 2021-06-20 ENCOUNTER — Inpatient Hospital Stay (HOSPITAL_COMMUNITY): Payer: Medicare Other

## 2021-06-20 ENCOUNTER — Inpatient Hospital Stay (HOSPITAL_COMMUNITY)
Admission: AD | Admit: 2021-06-20 | Discharge: 2021-06-26 | DRG: 545 | Disposition: A | Discharge status: Home Health | Payer: Medicare Other | Source: Other Acute Inpatient Hospital | Attending: Internal Medicine | Admitting: Internal Medicine

## 2021-06-20 ENCOUNTER — Other Ambulatory Visit: Payer: Self-pay | Admitting: Hematology & Oncology

## 2021-06-20 DIAGNOSIS — Z86718 Personal history of other venous thrombosis and embolism: Secondary | ICD-10-CM | POA: Diagnosis not present

## 2021-06-20 DIAGNOSIS — C679 Malignant neoplasm of bladder, unspecified: Secondary | ICD-10-CM | POA: Diagnosis present

## 2021-06-20 DIAGNOSIS — C676 Malignant neoplasm of ureteric orifice: Secondary | ICD-10-CM | POA: Diagnosis present

## 2021-06-20 DIAGNOSIS — J449 Chronic obstructive pulmonary disease, unspecified: Secondary | ICD-10-CM | POA: Diagnosis present

## 2021-06-20 DIAGNOSIS — Z806 Family history of leukemia: Secondary | ICD-10-CM | POA: Diagnosis not present

## 2021-06-20 DIAGNOSIS — Z961 Presence of intraocular lens: Secondary | ICD-10-CM | POA: Diagnosis present

## 2021-06-20 DIAGNOSIS — Z8551 Personal history of malignant neoplasm of bladder: Secondary | ICD-10-CM | POA: Diagnosis not present

## 2021-06-20 DIAGNOSIS — F32A Depression, unspecified: Secondary | ICD-10-CM | POA: Diagnosis present

## 2021-06-20 DIAGNOSIS — M549 Dorsalgia, unspecified: Secondary | ICD-10-CM | POA: Diagnosis present

## 2021-06-20 DIAGNOSIS — N179 Acute kidney failure, unspecified: Secondary | ICD-10-CM

## 2021-06-20 DIAGNOSIS — Z86711 Personal history of pulmonary embolism: Secondary | ICD-10-CM

## 2021-06-20 DIAGNOSIS — M3119 Other thrombotic microangiopathy: Secondary | ICD-10-CM

## 2021-06-20 DIAGNOSIS — Z79899 Other long term (current) drug therapy: Secondary | ICD-10-CM

## 2021-06-20 DIAGNOSIS — R4701 Aphasia: Secondary | ICD-10-CM | POA: Diagnosis present

## 2021-06-20 DIAGNOSIS — R531 Weakness: Secondary | ICD-10-CM

## 2021-06-20 DIAGNOSIS — T83593A Infection and inflammatory reaction due to other urinary stents, initial encounter: Secondary | ICD-10-CM | POA: Diagnosis present

## 2021-06-20 DIAGNOSIS — F17210 Nicotine dependence, cigarettes, uncomplicated: Secondary | ICD-10-CM | POA: Diagnosis present

## 2021-06-20 DIAGNOSIS — Z20822 Contact with and (suspected) exposure to covid-19: Secondary | ICD-10-CM | POA: Diagnosis present

## 2021-06-20 DIAGNOSIS — Z7952 Long term (current) use of systemic steroids: Secondary | ICD-10-CM

## 2021-06-20 DIAGNOSIS — Z9842 Cataract extraction status, left eye: Secondary | ICD-10-CM

## 2021-06-20 DIAGNOSIS — Y838 Other surgical procedures as the cause of abnormal reaction of the patient, or of later complication, without mention of misadventure at the time of the procedure: Secondary | ICD-10-CM | POA: Diagnosis present

## 2021-06-20 DIAGNOSIS — G8929 Other chronic pain: Secondary | ICD-10-CM | POA: Diagnosis present

## 2021-06-20 DIAGNOSIS — I6349 Cerebral infarction due to embolism of other cerebral artery: Secondary | ICD-10-CM | POA: Diagnosis not present

## 2021-06-20 DIAGNOSIS — Z882 Allergy status to sulfonamides status: Secondary | ICD-10-CM

## 2021-06-20 DIAGNOSIS — Z7951 Long term (current) use of inhaled steroids: Secondary | ICD-10-CM

## 2021-06-20 DIAGNOSIS — Z8249 Family history of ischemic heart disease and other diseases of the circulatory system: Secondary | ICD-10-CM | POA: Diagnosis not present

## 2021-06-20 DIAGNOSIS — F17200 Nicotine dependence, unspecified, uncomplicated: Secondary | ICD-10-CM | POA: Diagnosis present

## 2021-06-20 DIAGNOSIS — I6389 Other cerebral infarction: Secondary | ICD-10-CM | POA: Diagnosis present

## 2021-06-20 DIAGNOSIS — N136 Pyonephrosis: Secondary | ICD-10-CM | POA: Diagnosis present

## 2021-06-20 DIAGNOSIS — G8194 Hemiplegia, unspecified affecting left nondominant side: Secondary | ICD-10-CM | POA: Diagnosis present

## 2021-06-20 DIAGNOSIS — N10 Acute tubulo-interstitial nephritis: Secondary | ICD-10-CM | POA: Diagnosis present

## 2021-06-20 DIAGNOSIS — N202 Calculus of kidney with calculus of ureter: Secondary | ICD-10-CM | POA: Diagnosis present

## 2021-06-20 DIAGNOSIS — Z888 Allergy status to other drugs, medicaments and biological substances status: Secondary | ICD-10-CM

## 2021-06-20 DIAGNOSIS — T83122A Displacement of urinary stent, initial encounter: Secondary | ICD-10-CM | POA: Diagnosis present

## 2021-06-20 DIAGNOSIS — Y732 Prosthetic and other implants, materials and accessory gastroenterology and urology devices associated with adverse incidents: Secondary | ICD-10-CM | POA: Diagnosis present

## 2021-06-20 DIAGNOSIS — T8140XA Infection following a procedure, unspecified, initial encounter: Secondary | ICD-10-CM | POA: Diagnosis present

## 2021-06-20 DIAGNOSIS — D8481 Immunodeficiency due to conditions classified elsewhere: Secondary | ICD-10-CM | POA: Diagnosis present

## 2021-06-20 DIAGNOSIS — I69354 Hemiplegia and hemiparesis following cerebral infarction affecting left non-dominant side: Secondary | ICD-10-CM

## 2021-06-20 DIAGNOSIS — E669 Obesity, unspecified: Secondary | ICD-10-CM | POA: Diagnosis present

## 2021-06-20 DIAGNOSIS — N1831 Chronic kidney disease, stage 3a: Secondary | ICD-10-CM | POA: Diagnosis present

## 2021-06-20 DIAGNOSIS — Z881 Allergy status to other antibiotic agents status: Secondary | ICD-10-CM

## 2021-06-20 DIAGNOSIS — Z9841 Cataract extraction status, right eye: Secondary | ICD-10-CM | POA: Diagnosis not present

## 2021-06-20 DIAGNOSIS — R29703 NIHSS score 3: Secondary | ICD-10-CM | POA: Diagnosis not present

## 2021-06-20 DIAGNOSIS — Z87442 Personal history of urinary calculi: Secondary | ICD-10-CM | POA: Diagnosis not present

## 2021-06-20 DIAGNOSIS — I82C23 Chronic embolism and thrombosis of internal jugular vein, bilateral: Secondary | ICD-10-CM | POA: Diagnosis present

## 2021-06-20 DIAGNOSIS — M1990 Unspecified osteoarthritis, unspecified site: Secondary | ICD-10-CM | POA: Diagnosis present

## 2021-06-20 DIAGNOSIS — M48061 Spinal stenosis, lumbar region without neurogenic claudication: Secondary | ICD-10-CM | POA: Diagnosis present

## 2021-06-20 DIAGNOSIS — Z8572 Personal history of non-Hodgkin lymphomas: Secondary | ICD-10-CM | POA: Diagnosis not present

## 2021-06-20 HISTORY — PX: IR US GUIDE VASC ACCESS LEFT: IMG2389

## 2021-06-20 HISTORY — PX: IR FLUORO GUIDE CV LINE LEFT: IMG2282

## 2021-06-20 LAB — COMPREHENSIVE METABOLIC PANEL
ALT: 18 U/L (ref 0–44)
AST: 33 U/L (ref 15–41)
Albumin: 3.7 g/dL (ref 3.5–5.0)
Alkaline Phosphatase: 32 U/L — ABNORMAL LOW (ref 38–126)
Anion gap: 10 (ref 5–15)
BUN: 35 mg/dL — ABNORMAL HIGH (ref 8–23)
CO2: 26 mmol/L (ref 22–32)
Calcium: 9 mg/dL (ref 8.9–10.3)
Chloride: 102 mmol/L (ref 98–111)
Creatinine, Ser: 0.74 mg/dL (ref 0.44–1.00)
GFR, Estimated: >60 mL/min (ref >60)
Glucose, Bld: 101 mg/dL — ABNORMAL HIGH (ref 70–99)
Potassium: 4.4 mmol/L (ref 3.5–5.1)
Sodium: 138 mmol/L (ref 135–145)
Total Bilirubin: 1.2 mg/dL (ref 0.3–1.2)
Total Protein: 6.4 g/dL — ABNORMAL LOW (ref 6.5–8.1)

## 2021-06-20 LAB — CBC
HCT: 45.5 % (ref 36.0–46.0)
Hemoglobin: 14.8 g/dL (ref 12.0–15.0)
MCH: 32.2 pg (ref 26.0–34.0)
MCHC: 32.5 g/dL (ref 30.0–36.0)
MCV: 98.9 fL (ref 80.0–100.0)
Platelets: 27 10*3/uL — CL (ref 150–400)
RBC: 4.6 MIL/uL (ref 3.87–5.11)
RDW: 13.4 % (ref 11.5–15.5)
WBC: 11.9 10*3/uL — ABNORMAL HIGH (ref 4.0–10.5)
nRBC: 0 % (ref 0.0–0.2)

## 2021-06-20 LAB — URINE CULTURE: Culture: NO GROWTH

## 2021-06-20 LAB — CBC WITH DIFFERENTIAL/PLATELET
Abs Immature Granulocytes: 0.05 10*3/uL (ref 0.00–0.07)
Basophils Absolute: 0 10*3/uL (ref 0.0–0.1)
Basophils Relative: 0 %
Eosinophils Absolute: 0 10*3/uL (ref 0.0–0.5)
Eosinophils Relative: 0 %
HCT: 45.1 % (ref 36.0–46.0)
Hemoglobin: 14.7 g/dL (ref 12.0–15.0)
Immature Granulocytes: 1 %
Lymphocytes Relative: 16 %
Lymphs Abs: 1.8 10*3/uL (ref 0.7–4.0)
MCH: 32.5 pg (ref 26.0–34.0)
MCHC: 32.6 g/dL (ref 30.0–36.0)
MCV: 99.6 fL (ref 80.0–100.0)
Monocytes Absolute: 0.7 10*3/uL (ref 0.1–1.0)
Monocytes Relative: 7 %
Neutro Abs: 8.4 10*3/uL — ABNORMAL HIGH (ref 1.7–7.7)
Neutrophils Relative %: 76 %
Platelets: 16 10*3/uL — CL (ref 150–400)
RBC: 4.53 MIL/uL (ref 3.87–5.11)
RDW: 13.5 % (ref 11.5–15.5)
WBC: 11 10*3/uL — ABNORMAL HIGH (ref 4.0–10.5)
nRBC: 0 % (ref 0.0–0.2)

## 2021-06-20 LAB — HEMOGLOBIN A1C
Hgb A1c MFr Bld: 5.3 % (ref 4.8–5.6)
Mean Plasma Glucose: 105.41 mg/dL

## 2021-06-20 LAB — TYPE AND SCREEN
ABO/RH(D): O POS
Antibody Screen: NEGATIVE

## 2021-06-20 LAB — LIPID PANEL
Cholesterol: 140 mg/dL (ref 0–200)
HDL: 52 mg/dL (ref >40)
LDL Cholesterol: 65 mg/dL (ref 0–99)
Total CHOL/HDL Ratio: 2.7 RATIO
Triglycerides: 113 mg/dL (ref <150)
VLDL: 23 mg/dL (ref 0–40)

## 2021-06-20 LAB — GLUCOSE, CAPILLARY
Glucose-Capillary: 116 mg/dL — ABNORMAL HIGH (ref 70–99)
Glucose-Capillary: 117 mg/dL — ABNORMAL HIGH (ref 70–99)

## 2021-06-20 LAB — PROTIME-INR
INR: 1.3 — ABNORMAL HIGH (ref 0.8–1.2)
Prothrombin Time: 15.7 seconds — ABNORMAL HIGH (ref 11.4–15.2)

## 2021-06-20 LAB — BASIC METABOLIC PANEL
Anion gap: 10 (ref 5–15)
BUN: 32 mg/dL — ABNORMAL HIGH (ref 8–23)
CO2: 25 mmol/L (ref 22–32)
Calcium: 9 mg/dL (ref 8.9–10.3)
Chloride: 100 mmol/L (ref 98–111)
Creatinine, Ser: 2.2 mg/dL — ABNORMAL HIGH (ref 0.44–1.00)
GFR, Estimated: 22 mL/min — ABNORMAL LOW (ref >60)
Glucose, Bld: 143 mg/dL — ABNORMAL HIGH (ref 70–99)
Potassium: 4.1 mmol/L (ref 3.5–5.1)
Sodium: 135 mmol/L (ref 135–145)

## 2021-06-20 LAB — VITAMIN B12: Vitamin B-12: 592 pg/mL (ref 180–914)

## 2021-06-20 LAB — MRSA NEXT GEN BY PCR, NASAL: MRSA by PCR Next Gen: NOT DETECTED

## 2021-06-20 LAB — PATHOLOGIST SMEAR REVIEW

## 2021-06-20 LAB — LACTATE DEHYDROGENASE: LDH: 386 U/L — ABNORMAL HIGH (ref 98–192)

## 2021-06-20 LAB — MAGNESIUM: Magnesium: 2 mg/dL (ref 1.7–2.4)

## 2021-06-20 LAB — BILIRUBIN, FRACTIONATED(TOT/DIR/INDIR)
Bilirubin, Direct: 0.2 mg/dL (ref 0.0–0.2)
Indirect Bilirubin: 0.8 mg/dL (ref 0.3–0.9)
Total Bilirubin: 1 mg/dL (ref 0.3–1.2)

## 2021-06-20 LAB — PHOSPHORUS: Phosphorus: 2.2 mg/dL — ABNORMAL LOW (ref 2.5–4.6)

## 2021-06-20 MED ORDER — POLYVINYL ALCOHOL 1.4 % OP SOLN
1.0000 [drp] | OPHTHALMIC | Status: DC | PRN
  Filled 2021-06-20: qty 15

## 2021-06-20 MED ORDER — IPRATROPIUM-ALBUTEROL 0.5-2.5 (3) MG/3ML IN SOLN: 3.0000 mL | RESPIRATORY_TRACT | Status: DC | PRN

## 2021-06-20 MED ORDER — AZATHIOPRINE 50 MG PO TABS
100.0000 mg | ORAL_TABLET | Freq: Every day | ORAL | Status: DC
  Administered 2021-06-21: 100 mg via ORAL
  Filled 2021-06-20 (×2): qty 2

## 2021-06-20 MED ORDER — ACD FORMULA A 0.73-2.45-2.2 GM/100ML VI SOLN
1000.0000 mL | Status: DC
  Administered 2021-06-20: 1000 mL
  Filled 2021-06-20 (×2): qty 1000

## 2021-06-20 MED ORDER — CHOLECALCIFEROL 10 MCG (400 UNIT) PO TABS
400.0000 [IU] | ORAL_TABLET | Freq: Every day | ORAL | Status: DC
  Administered 2021-06-21 – 2021-06-26 (×5): 400 [IU] via ORAL
  Filled 2021-06-20 (×8): qty 1

## 2021-06-20 MED ORDER — METHYLPREDNISOLONE SODIUM SUCC 125 MG IJ SOLR
125.0000 mg | Freq: Three times a day (TID) | INTRAMUSCULAR | Status: DC
  Administered 2021-06-20 – 2021-06-21 (×4): 125 mg via INTRAVENOUS
  Filled 2021-06-20 (×4): qty 2

## 2021-06-20 MED ORDER — CALCIUM GLUCONATE-NACL 2-0.675 GM/100ML-% IV SOLN
2.0000 g | Freq: Once | INTRAVENOUS | Status: AC
  Administered 2021-06-20: 2000 mg via INTRAVENOUS
  Filled 2021-06-20: qty 100

## 2021-06-20 MED ORDER — FLUOXETINE HCL 20 MG PO CAPS
20.0000 mg | ORAL_CAPSULE | Freq: Every day | ORAL | Status: DC
  Administered 2021-06-21 – 2021-06-26 (×6): 20 mg via ORAL
  Filled 2021-06-20 (×6): qty 1

## 2021-06-20 MED ORDER — LIDOCAINE HCL 1 % IJ SOLN
INTRAMUSCULAR | Status: DC | PRN
  Administered 2021-06-20: 3 mL

## 2021-06-20 MED ORDER — CHLORHEXIDINE GLUCONATE CLOTH 2 % EX PADS: 6.0000 | MEDICATED_PAD | Freq: Every day | CUTANEOUS | Status: DC

## 2021-06-20 MED ORDER — ANTICOAGULANT SODIUM CITRATE 4% (200MG/5ML) IV SOLN
5.0000 mL | Freq: Once | Status: AC
  Administered 2021-06-20: 5 mL
  Filled 2021-06-20: qty 5

## 2021-06-20 MED ORDER — QUETIAPINE FUMARATE 25 MG PO TABS
25.0000 mg | ORAL_TABLET | Freq: Every day | ORAL | Status: DC
Start: 2021-06-20 — End: 2021-06-26
  Administered 2021-06-20 – 2021-06-25 (×6): 25 mg via ORAL
  Filled 2021-06-20 (×6): qty 1

## 2021-06-20 MED ORDER — PANTOPRAZOLE SODIUM 40 MG PO TBEC
40.0000 mg | DELAYED_RELEASE_TABLET | Freq: Every day | ORAL | Status: DC
  Administered 2021-06-21 – 2021-06-26 (×5): 40 mg via ORAL
  Filled 2021-06-20 (×5): qty 1

## 2021-06-20 MED ORDER — ROSUVASTATIN CALCIUM 20 MG PO TABS
20.0000 mg | ORAL_TABLET | Freq: Every day | ORAL | Status: DC
  Administered 2021-06-20 – 2021-06-25 (×6): 20 mg via ORAL
  Filled 2021-06-20 (×6): qty 1

## 2021-06-20 MED ORDER — ASPIRIN EC 325 MG PO TBEC: 325.0000 mg | DELAYED_RELEASE_TABLET | Freq: Every day | ORAL | Status: DC

## 2021-06-20 MED ORDER — LIDOCAINE HCL 1 % IJ SOLN
INTRAMUSCULAR | Status: AC
  Filled 2021-06-20: qty 20

## 2021-06-20 MED ORDER — SODIUM CHLORIDE 0.9 % IV SOLN: INTRAVENOUS | Status: DC

## 2021-06-20 MED ORDER — ACETAMINOPHEN 325 MG PO TABS
650.0000 mg | ORAL_TABLET | ORAL | Status: DC | PRN
  Administered 2021-06-20: 650 mg via ORAL

## 2021-06-20 MED ORDER — HEPARIN SODIUM (PORCINE) 1000 UNIT/ML IJ SOLN
INTRAMUSCULAR | Status: AC | PRN
  Administered 2021-06-20: 3000 [IU] via INTRAVENOUS

## 2021-06-20 MED ORDER — NICOTINE 21 MG/24HR TD PT24
21.0000 mg | MEDICATED_PATCH | Freq: Every day | TRANSDERMAL | Status: DC
  Administered 2021-06-20 – 2021-06-26 (×7): 21 mg via TRANSDERMAL
  Filled 2021-06-20 (×7): qty 1

## 2021-06-20 MED ORDER — GABAPENTIN 600 MG PO TABS: 600.0000 mg | ORAL_TABLET | Freq: Three times a day (TID) | ORAL | Status: DC | PRN

## 2021-06-20 MED ORDER — VITAMIN B-12 1000 MCG PO TABS
1000.0000 ug | ORAL_TABLET | Freq: Every day | ORAL | Status: DC
  Administered 2021-06-21 – 2021-06-26 (×6): 1000 ug via ORAL
  Filled 2021-06-20 (×6): qty 1

## 2021-06-20 MED ORDER — ADULT MULTIVITAMIN W/MINERALS CH
1.0000 | ORAL_TABLET | Freq: Every day | ORAL | Status: DC
  Administered 2021-06-21 – 2021-06-26 (×6): 1 via ORAL
  Filled 2021-06-20 (×6): qty 1

## 2021-06-20 MED ORDER — SODIUM CHLORIDE 0.9 % IV SOLN: 1.0000 g | INTRAVENOUS | Status: DC

## 2021-06-20 MED ORDER — IOHEXOL 350 MG/ML SOLN
75.0000 mL | Freq: Once | INTRAVENOUS | Status: AC | PRN
  Administered 2021-06-20: 75 mL via INTRAVENOUS

## 2021-06-20 MED ORDER — HEPARIN SODIUM (PORCINE) 5000 UNIT/ML IJ SOLN
5000.0000 [IU] | Freq: Three times a day (TID) | INTRAMUSCULAR | Status: DC
  Administered 2021-06-20: 5000 [IU] via SUBCUTANEOUS
  Filled 2021-06-20: qty 1

## 2021-06-20 MED ORDER — LATANOPROST 0.005 % OP SOLN
1.0000 [drp] | Freq: Every day | OPHTHALMIC | Status: DC
  Administered 2021-06-21 – 2021-06-24 (×4): 1 [drp] via OPHTHALMIC
  Filled 2021-06-20: qty 2.5

## 2021-06-20 MED ORDER — SODIUM CHLORIDE 0.9% IV SOLUTION: Freq: Once | INTRAVENOUS | Status: DC

## 2021-06-20 MED ORDER — FUROSEMIDE 10 MG/ML IJ SOLN
20.0000 mg | Freq: Once | INTRAMUSCULAR | Status: AC
  Administered 2021-06-20: 20 mg via INTRAVENOUS
  Filled 2021-06-20: qty 2

## 2021-06-20 MED ORDER — SODIUM CHLORIDE 0.9 % IV SOLN
1.0000 g | INTRAVENOUS | Status: DC
  Administered 2021-06-21 – 2021-06-25 (×5): 1 g via INTRAVENOUS
  Filled 2021-06-20 (×5): qty 10

## 2021-06-20 MED ORDER — HYDROCODONE-ACETAMINOPHEN 5-325 MG PO TABS
1.0000 | ORAL_TABLET | Freq: Four times a day (QID) | ORAL | Status: DC | PRN
  Administered 2021-06-21: 2 via ORAL
  Filled 2021-06-20: qty 2

## 2021-06-20 MED ORDER — DIPHENHYDRAMINE HCL 25 MG PO CAPS
25.0000 mg | ORAL_CAPSULE | Freq: Four times a day (QID) | ORAL | Status: DC | PRN
  Administered 2021-06-20: 25 mg via ORAL
  Filled 2021-06-20: qty 1

## 2021-06-20 MED ORDER — CAPLACIZUMAB-YHDP 11 MG IJ KIT
11.0000 mg | PACK | Freq: Once | INTRAMUSCULAR | Status: AC
  Administered 2021-06-20: 11 mg via SUBCUTANEOUS
  Filled 2021-06-20: qty 1

## 2021-06-20 MED ORDER — METHYLPREDNISOLONE SODIUM SUCC 125 MG IJ SOLR
125.0000 mg | Freq: Three times a day (TID) | INTRAMUSCULAR | Status: DC
  Administered 2021-06-20: 125 mg via INTRAVENOUS
  Filled 2021-06-20: qty 2

## 2021-06-20 MED ORDER — CAPLACIZUMAB-YHDP 11 MG IJ KIT: 11.0000 mg | PACK | Freq: Once | INTRAMUSCULAR | 0 refills | Status: DC

## 2021-06-20 MED ORDER — CALCIUM CARBONATE ANTACID 500 MG PO CHEW
2.0000 | CHEWABLE_TABLET | ORAL | Status: AC
  Administered 2021-06-20 (×2): 400 mg via ORAL
  Filled 2021-06-20: qty 2

## 2021-06-20 MED ORDER — STROKE: EARLY STAGES OF RECOVERY BOOK: Freq: Once | Status: DC

## 2021-06-20 MED ORDER — DEXTROSE-NACL 5-0.45 % IV SOLN: INTRAVENOUS | Status: DC

## 2021-06-20 MED ORDER — ACETAMINOPHEN 325 MG PO TABS
650.0000 mg | ORAL_TABLET | ORAL | Status: DC | PRN
  Filled 2021-06-20: qty 2

## 2021-06-20 MED ORDER — HEPARIN SODIUM (PORCINE) 1000 UNIT/ML IJ SOLN
INTRAMUSCULAR | Status: AC
  Filled 2021-06-20: qty 10

## 2021-06-20 MED ORDER — METHYLPREDNISOLONE SODIUM SUCC 125 MG IJ SOLR: 125.0000 mg | Freq: Three times a day (TID) | INTRAMUSCULAR | 0 refills | Status: DC

## 2021-06-20 MED ORDER — FOLIC ACID 1 MG PO TABS
1.0000 mg | ORAL_TABLET | Freq: Every day | ORAL | Status: DC
  Administered 2021-06-21 – 2021-06-26 (×6): 1 mg via ORAL
  Filled 2021-06-20 (×6): qty 1

## 2021-06-20 MED ORDER — TIOTROPIUM BROMIDE MONOHYDRATE 18 MCG IN CAPS
18.0000 ug | ORAL_CAPSULE | Freq: Every day | RESPIRATORY_TRACT | Status: DC
  Administered 2021-06-21 – 2021-06-26 (×6): 18 ug via RESPIRATORY_TRACT
  Filled 2021-06-20: qty 5

## 2021-06-20 NOTE — Progress Notes (Signed)
SLP Cancellation Note  Patient Details Name: TRANAE LARAMIE MRN: [id number] DOB: [birth year]   Cancelled treatment:       Reason Eval/Treat Not Completed: Medical issues which prohibited therapy   SLP consult received and appreciated. Chart review completed. Per chart review, pt with pending imminent d/c to [facility]. Will defer SLP efforts at this time.  Cherrie Gauze, M.S., [facility] [phone])  Quintella Baton 06/20/2021, 11:52 AM

## 2021-06-20 NOTE — Plan of Care (Signed)

## 2021-06-20 NOTE — Progress Notes (Signed)
Patient O2 saturation at RA was 84% lying in bed while asleep and HR was was 115. Applied O2 [place] at 2L and O2 sats is now reading 93% and HR is 98 via tele. NPSharion Settler) was made aware. Continue to monitor.

## 2021-06-20 NOTE — Progress Notes (Signed)
Full consult note to follow.  Patient history of chronic relapsing TTP current take me to the hospital for acute Pyelonephritis- noted to have acute stroke. Discuss with Dr. Reuel Derby, pathology- no evidence of any schizotocytes on the smear.   How ever given history of TTP/acute stroke-emergent treatment for TTP should be instituted.  While awaiting definitive treatment with plasma exchange at [facility]. ( transfer pen ding. Tentative time 1 PM today); will plan to start high-dose steroids; cablivi- will get from [facility] ; two units of plasma infusion. Discussed with pharmacy; nursing staff. And also discuss with the hospitalist service.  Discussed with the daughter at bedside length. Shes an agreement with a plan.

## 2021-06-20 NOTE — Assessment & Plan Note (Addendum)
Patient underwent recent ureteroscopy, laser lithotripsy and stent placement which was removed a day prior to admission.  CT revealed right-sided hydronephrosis with perinephric stranding. Seen by urology who recommended monitoring her and 7 days of Keflex  on empiric IV Rocephin

## 2021-06-20 NOTE — Assessment & Plan Note (Deleted)
Platelets are trending down.  There is suspicion for acute flare.  Oncology/hematology has been consulted. Follows with Dr Learta Codding Her home meds include-azathioprine, prednisone, folic acid

## 2021-06-20 NOTE — Assessment & Plan Note (Addendum)
Baseline Cr 1.6 > 0.74. REsolved

## 2021-06-20 NOTE — Consult Note (Signed)
[place] TeleNeurology Consult Services   Patient Name:   [name], [name] Date of Birth:   1941-02-02 Identification Number:   MRN - [id number] Date of Service:   06/20/2021 03:40:58  Diagnosis:       I63.9 - Cerebrovascular accident (CVA), unspecified mechanism ([place])  Impression:      [name] is a 81 year-old woman with a PMH of Depression, COPD, Nicotine Dependence, TTP, Recent bladder biopsy and fulgation, Nephrotlithiasis s/p lithotripsy on 06/18/2021, acute pyelonephritis who developed left sided weakness which has improved. TNK was not given as she is outside the window.  Our recommendations are outlined below.  Recommendations:        Stroke/Telemetry Floor       Neuro Checks       Bedside Swallow Eval       DVT Prophylaxis       IV Fluids, Normal Saline       Head of Bed 30 Degrees       Euglycemia and Avoid Hyperthermia (PRN Acetaminophen)       Initiate or continue Aspirin 325 MG daily       Antihypertensives PRN if Blood pressure is greater than 220/120 or there is a concern for End organ damage/contraindications for permissive HTN. If blood pressure is greater than 220/120 give labetalol PO or IV or Vasotec IV with a goal of 15% reduction in BP during the first 24 hours.       - MRI Brain without contrast       - TTE       - Labs: lipid panel; HgbA1c       - PT/OT/ST where applicable  Routine Consultation with [facility] for Follow up Care  Sign Out:       Discussed with Emergency Department Provider       Discussed with Rapid Response Team    ------------------------------------------------------------------------------  Advanced Imaging: CTA Head and Neck Completed.  LVO:No  Patient doesn't meet criteria for emergent NIR consideration   Metrics: Last Known Well: 06/19/2021 22:30:00 [facility] Notification Time: 06/20/2021 03:40:58 Stamp Time: 06/20/2021 03:40:58 Initial Response Time: 06/20/2021  03:43:04 Symptoms: Left sided weakness. NIHSS Start Assessment Time: 06/20/2021 03:45:38 Patient is not a candidate for Thrombolytic. Thrombolytic Medical Decision: 06/20/2021 03:46:04 Patient was not deemed candidate for Thrombolytic because of following reasons: Last Well Known Above 4.5 Hours. No disabling symptoms.  CT head showed no acute hemorrhage or acute core infarct.  ED Physician not notified of diagnostic impression and management plan because Discussed with rapid response team    ------------------------------------------------------------------------------  History of Present Illness: Patient is a 81 year old Female.  Inpatient stroke alert was called for symptoms of Left sided weakness. [name] is a 81 year-old woman with a PMH of Depression, COPD, Nicotine Dependence, TTP, Recent bladder biopsy and fulgation, Nephrotlithiasis s/p lithotripsy on 06/18/2021 who presented with severe flank pain and was found to have acute pyelonephritis. Stroke alert was called for left sided weakness. As per bedside nurse [name] woke up at 2am and was noted to have left sided weakness involving her arm and leg. [facility] team states that her symptoms have improved.   Past Medical History:      There is no history of Stroke      There is no history of Seizures  Medications:  No Anticoagulant use  No Antiplatelet use Reviewed EMR for current medications  Allergies:  Reviewed Description: Cefuroxime, Adhensive tape, Sulfa drugs  Social History: Smoking: Yes  Family History:  There is no family history of premature cerebrovascular disease pertinent to this consultation  ROS : 14 Points Review of Systems was performed and was negative except mentioned in HPI.  Past Surgical History: There Is No Surgical History Contributory To Todays Visit    Examination: BP(115/54), Pulse(88), 1A: Level of Consciousness - Alert; keenly responsive + 0 1B: Ask Month and Age - Both  Questions Right + 0 1C: Blink Eyes & Squeeze Hands - Performs Both Tasks + 0 2: Test Horizontal Extraocular Movements - Normal + 0 3: Test Visual Fields - No Visual Loss + 0 4: Test Facial Palsy (Use Grimace if Obtunded) - Normal symmetry + 0 5A: Test Left Arm Motor Drift - Drift, but doesn't hit bed + 1 5B: Test Right Arm Motor Drift - No Drift for 10 Seconds + 0 6A: Test Left Leg Motor Drift - Drift, but doesn't hit bed + 1 6B: Test Right Leg Motor Drift - No Drift for 5 Seconds + 0 7: Test Limb Ataxia (FNF/Heel-Shin) - No Ataxia + 0 8: Test Sensation - Mild-Moderate Loss: Less Sharp/More Dull + 1 9: Test Language/Aphasia - Normal; No aphasia + 0 10: Test Dysarthria - Normal + 0 11: Test Extinction/Inattention - No abnormality + 0  NIHSS Score: 3   Pre-Morbid Modified Rankin Scale: 0 Points = No symptoms at all   Patient/Family was informed the Neurology Consult would occur via TeleHealth consult by way of interactive audio and video telecommunications and consented to receiving care in this manner.   Patient is being evaluated for possible acute neurologic impairment and high probability of imminent or life-threatening deterioration. I spent total of 28 minutes providing care to this patient, including time for face to face visit via telemedicine, review of medical records, imaging studies and discussion of findings with providers, the patient and/or family.   Dr Asa Saunas   [facility] [phone]   Case [id number]

## 2021-06-20 NOTE — Assessment & Plan Note (Signed)
Status post recent ureteroscopy with stent placement removed day prior to admission.  Seen by urology.

## 2021-06-20 NOTE — Progress Notes (Signed)
LKWT 2230 when patient went to sleep. She woke up to go to the bathroom and noticed left sided weakness @0200 .Telestroke Cart activated @0311  while patient in CT. Dr. Lyndle Herrlich on Telestroke cart @0343 . NCCT-no acute abnormality, ASPECTS 10. Results given to Dr. Lyndle Herrlich @0343 . Patient left CT @0404 . CTA- Negative for large vessel occlusion. And generally mild for age atherosclerosis in the head and neck, most pronounced at the ICA siphons. No significant arterial stenosis. Results given to Dr. Lyndle Herrlich @0418 .

## 2021-06-20 NOTE — Assessment & Plan Note (Signed)
-   As needed nicotine patch ordered for nicotine craving 

## 2021-06-20 NOTE — Progress Notes (Signed)
° °  °  Patient: [name] JSE:[id number] DOB: [birth year] DOA: 06/19/2021  Subjective: Nurse reports patient woke up and was feeling weak.  Assessment with left sided weakness  Stroke risk factors TTP, Tobacco use.  Obesity, Hx TIA, bladder cancer recent diagnosis with TURP procedure and cho   Objective: Vitals:   06/20/21 0031 06/20/21 0233  BP:  (!) 115/54  Pulse:  88  Resp:  18  Temp: 98 F (36.7 C) 99.1 F (37.3 C)  SpO2: 92% 94%  CBG 116   Assessment:  Neuro A & O. Face symmetrical. Tongue midline. Sensation normal, no dysarthria. Seems some slowness with responses but is hard of hearing and could be process delay. Right arm good strength, no drift and good grips.  Left arm initially unable to pick up off bed and + drift half way to bed  Right  leg no drift, left leg with very little movement against gravity to lift off bed and drift ed straight to bed.  Resp - sats stable with oxygen 2 liters [place]. + frequent coarse cough CV SR Code stroke initiated   Plan:Code stroke with tele neuro cart in CT initiated while getting initial head CT which was negative for acute findings.  Patient evaluated by neurologist while in [place].  Left leg weakness seems to have resolved, left arm weakness improved. Not TNK candidate CTA head and neck down.   Neurology recommendations  Echo MRI PT/OT/ ST Daily ASA DVT prophylaxis - ordered heparin SQ  consult with hematology to follow patient while in house with concern of TTP flare as husband reports have had TIA symptoms in past prior to TTP flare   Platelet count now reported significantly lower at 30. Husband is refusing patient to have MRI and is insisting patient be transferred cause her "TTP can be handled here" Husband insistent Dr. Learta Codding be notified, he was found to be on call and case discussed with him. Patient well known to him. Additional labs, LDH, fractionated bili ordered stat. Plan for transfer if plasmapheresis is  needed  Critical care time 60 minutes Patient with hematological urgency in setting of acute stroke symptoms.  Critical care time evaluating patient, review of labs/test, discussions of care with neurology and hematology

## 2021-06-20 NOTE — Discharge Summary (Signed)
Physician Discharge Summary  [name] YQM:[id number] DOB: 12/28/1940 DOA: 06/19/2021  PCP: Kirk Ruths, MD  Admit date: 06/19/2021 Discharge date: 06/20/2021  Transfer patient to [facility]   Discharge Condition: Stable CODE STATUS: Full Diet recommendation: Currently n.p.o.  Brief/Interim Summary: 81 year old female with history of DVT PE follows with Dr. Benay Spice, COPD, tobacco use, depression who recently underwent cystoscopy with lithotripsy requiring stent placement.  Stent was removed by herself a day prior to admission.  She started developing right-sided flank pain therefore came to the ED.  She was found to have pyelonephritis therefore started on IV Rocephin per urology recommendation. Overnight patient developed left-sided weakness and code stroke was called.  Her symptoms soon after started improving but still had some left-sided weakness of the upper extremity.  She was seen by neurology, CT head was done which did not show any large vessel occlusion.  MRI was consistent with small multiple infarcts.  Her platelets trended down significantly to 24K and oncology was consulted.  It was suspected that she was in her TTP flare therefore arrangements were made to transfer patient to [facility] for plasmapheresis.  Patient has required multiple plasmapheresis in the past for acute TTP flares, last time was in May 2022.  She follows with Dr Learta Codding and takes her medications as prescribed.  No recent adjustments in her medications but daughter confirms that any sort of stress triggers this.  Case was discussed with Dr. Marin Olp who will see the patient in consultation.  Dr. Arcelia Jew from IR made aware who will assist in placing HD catheter for plasmapheresis.  Dr. Joylene Grapes from nephrology has made staff arrangements arrangements and coordinate.  Patient was excepted by [facility] hospitalist, Dr. Thereasa Solo.  Daughter who is at bedside, also upto date with the plan.     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  He will be transferred to [facility] for plasmapheresis.  Oncology, Dr. Marin Olp has been consulted at [facility].  IR, Dr. Anselm Pancoast will place a temporary dialysis catheter once patient arrives at [facility].  Dr. Joylene Grapes from nephrology will help arrange plasmapheresis (will not need official consult but will help coorinate with HD RN) Dr Yevette Edwards from [place] at [facility] arranging for appropriate TTP meds in the meantime- IV Solumedrol, [place] with Dr Learta Codding Her home meds include-azathioprine, prednisone, folic acid  Acute left-sided weakness Patient developed left-sided weakness overnight which appears to be resolving this morning.  CTA of the neck was unremarkable for any large vessel occlusion. MRI- acute small multifocal infarct She will avoid using antiplatelets A1c, lipid panel Echocardiogram-pending PT/OT/speech Suspicion of this is related to TTP flare?  Acute pyelonephritis- (present on admission) Patient underwent recent ureteroscopy, laser lithotripsy and stent placement which was removed a day prior to admission.  CT revealed right-sided hydronephrosis with perinephric stranding. Seen by urology who recommended monitoring her and 7 days of Keflex  on empiric IV Rocephin  AKI (acute kidney injury) ([place]) Baseline Cr 1.6 > 0.74. REsolved  Nephrolithiasis Status post recent ureteroscopy with stent placement removed day prior to admission.  Seen by urology.  Nicotine dependence- (present on admission) As needed nicotine patch  COPD (chronic obstructive pulmonary disease) ([place])- (present on admission) As needed bronchodilators       Body mass index is 30.77 kg/m.         Discharge Diagnoses:  Principal Problem:  TTP (thrombotic thrombocytopenic purpura) Active Problems:   Acute  left-sided weakness   Acute pyelonephritis   COPD (chronic obstructive pulmonary disease) (H[facility])   Nicotine dependence   Nephrolithiasis   AKI (acute kidney injury) ([place])      Consultations: [facility] - Dr Marin Olp (Onc), Dr Anselm Pancoast (IR) and Dr Joylene Grapes (Nephro will help coordinate plasma exchange but no need for formal consult for now)  Subjective: Ptn is still weak. Especially in her LUE. Has mild cough.   Daughter is at the bedside.   Discharge Exam: Vitals:   06/20/21 1000 06/20/21 1100  BP: (!) 131/56 (!) 128/56  Pulse: 89 90  Resp: 20 17  Temp:    SpO2: 94% 95%   Vitals:   06/20/21 0823 06/20/21 0900 06/20/21 1000 06/20/21 1100  BP: (!) 118/55 (!) 121/49 (!) 131/56 (!) 128/56  Pulse: 97 96 89 90  Resp: (!) 22 (!) _0 Temp:      TempSrc:      SpO2: 94% 94% 94% 95%  Weight: 81.3 kg     Height: _1  (1.626 m)       General: Pt is alert, awake, not in acute distress Cardiovascular: RRR, S1/S2 +, no rubs, no gallops Respiratory: CTA bilaterally, no wheezing, no rhonchi Abdominal: Soft, NT, ND, bowel sounds + Extremities: no edema, no cyanosis LUE strenght 3/5, remainder 3 ext strength 4+/5  Discharge Instructions   Allergies as of 06/20/2021       Reactions   Adhesive [tape] Other (See Comments)   Blisters, Band-Aids brand rips off the skin- paper tape only!!   Cefuroxime Axetil Anxiety, Other (See Comments)   Made the patient jittery   Cefuroxime Axetil Anxiety, Other (See Comments)   Shaky, amoxicillin ok Made the patient jittery   Pedi-pre Tape Spray [wound Dressing Adhesive] Other (See Comments)   Blisters, band-aide brand  Blisters, Band-Aids brand rips off the skin- paper tape only!!   Other Hives, Other (See Comments)   Wool: Reaction is hives Statistician tape: Reaction is blistering   Sulfa Antibiotics Hives   Sulfa Drugs Cross Reactors Hives        Medication List     STOP taking these medications     nitrofurantoin 50 MG capsule Commonly known as: MACRODANTIN       TAKE these medications    azaTHIOprine 50 MG tablet Commonly known as: IMURAN Take 2 tablets (100 mg total) by mouth daily.   caplacizumab 11 MG Kit injection Commonly known as: CABLIVI Inject 11 mg into the skin once for 1 dose.   cefTRIAXone 1 g in sodium chloride 0.9 % 100 mL Inject 1 g into the vein daily. Start taking on: June 21, 2021   cholecalciferol 10 MCG (400 UNIT) Tabs tablet Commonly known as: VITAMIN D3 Take 400 Units by mouth daily.   cyanocobalamin 1000 MCG tablet Take 1,000 mcg by mouth daily.   FLUoxetine 20 MG capsule Commonly known as: PROZAC Take 20 mg by mouth daily.   folic acid 1 MG tablet Commonly known as: FOLVITE Take 1 mg by mouth daily.   gabapentin 600 MG tablet Commonly known as: NEURONTIN Take 600 mg by mouth 3 (three) times daily as needed (pain).   HYDROcodone-acetaminophen 5-325 MG tablet Commonly known as: NORCO/VICODIN Take 1-2 tablets by mouth every 6 (six) hours as needed for up to 5 days for moderate pain.   latanoprost 0.005 % ophthalmic solution Commonly known as: XALATAN Place 1  drop into both eyes.   methylPREDNISolone sodium succinate 125 mg/2 mL injection Commonly known as: SOLU-MEDROL Inject 2 mLs (125 mg total) into the vein every 8 (eight) hours.   multivitamin tablet Take 1 tablet by mouth daily.   NICODERM CQ TD Place 1 patch onto the skin daily as needed (when in hospital).   polyethylene glycol 17 g packet Commonly known as: MIRALAX / GLYCOLAX Take 17 g by mouth daily. What changed:  when to take this reasons to take this   polyvinyl alcohol 1.4 % ophthalmic solution Commonly known as: LIQUIFILM TEARS Place 1 drop into both eyes as needed for dry eyes.   predniSONE 5 MG tablet Commonly known as: DELTASONE Take 1 tablet (5 mg total) by mouth daily with breakfast.   QUEtiapine 25 MG tablet Commonly known as:  SEROQUEL Take 25 mg by mouth at bedtime.   rosuvastatin 20 MG tablet Commonly known as: CRESTOR Take 20 mg by mouth at bedtime.   tiotropium 18 MCG inhalation capsule Commonly known as: SPIRIVA Place 18 mcg into inhaler and inhale daily.   traMADol 50 MG tablet Commonly known as: ULTRAM Take 1 tablet (50 mg total) by mouth 2 (two) times daily. What changed:  when to take this reasons to take this        Allergies  Allergen Reactions   Adhesive [Tape] Other (See Comments)    Blisters, Band-Aids brand rips off the skin- paper tape only!!   Cefuroxime Axetil Anxiety and Other (See Comments)    Made the patient jittery   Cefuroxime Axetil Anxiety and Other (See Comments)    Shaky, amoxicillin ok Made the patient jittery   Pedi-Pre Tape Spray [Wound Dressing Adhesive] Other (See Comments)    Blisters, band-aide brand  Blisters, Band-Aids brand rips off the skin- paper tape only!!   Other Hives and Other (See Comments)    Wool: Reaction is hives Statistician tape: Reaction is blistering    Sulfa Antibiotics Hives   Sulfa Drugs Cross Reactors Hives    You were cared for by a hospitalist during your hospital stay. If you have any questions about your discharge medications or the care you received while you were in the hospital after you are discharged, you can call the unit and asked to speak with the hospitalist on call if the hospitalist that took care of you is not available. Once you are discharged, your primary care physician will handle any further medical issues. Please note that no refills for any discharge medications will be authorized once you are discharged, as it is imperative that you return to your primary care physician (or establish a relationship with a primary care physician if you do not have one) for your aftercare needs so that they can reassess your need for medications and monitor your lab values.   Procedures/Studies: CT ABDOMEN PELVIS WO  CONTRAST  Result Date: 06/19/2021 CLINICAL DATA:  Flank pain, kidney stone suspected EXAM: CT ABDOMEN AND PELVIS WITHOUT CONTRAST TECHNIQUE: Multidetector CT imaging of the abdomen and pelvis was performed following the standard protocol without IV contrast. RADIATION DOSE REDUCTION: This exam was performed according to the departmental dose-optimization program which includes automated exposure control, adjustment of the mA and/or kV according to patient size and/or use of iterative reconstruction technique. COMPARISON:  CT 05/31/2021 FINDINGS: Lower chest: Bibasilar atelectasis. There are 5 mm nodules in the peripheral lung bases (series 3, image 4). Hepatobiliary: No focal liver abnormality is seen. Possible tiny hyperdense  stones in the gallbladder fundus. Gallbladder is nondilated. Pancreas: Unremarkable. No pancreatic ductal dilatation or surrounding inflammatory changes. Spleen: Normal in size without focal abnormality. Adrenals/Urinary Tract: Adrenal glands are unremarkable. Punctate nonobstructive left lower pole renal stone. No left-sided hydronephrosis. Unchanged left lower pole cyst. Right-sided hydroureteronephrosis with perinephric and periureteral stranding. There is a small focus of gas within the right renal pelvis (series 2, image 30) likely related to recent retrograde pyelogram. Unchanged right renal cysts. There is no visible obstructing stone in the ureter. The previously seen 3 mm stone in the distal right ureter is no longer visible distant with recent ureteroscopy and lithotripsy. There is no stent in the ureter present. There is gas in the bladder consistent with recent procedure. Stomach/Bowel: Small hiatal hernia. The stomach is otherwise within normal limits. There is no evidence of bowel obstruction. The appendix is normal. There are few sigmoid diverticula. Vascular/Lymphatic: Aortoiliac atherosclerotic calcifications. No AAA. No lymphadenopathy. Reproductive: There is a 4.9 x 4.0 x  6.0 cm left adnexal structure with intermediate density, unchanged from prior exam. Other: No bowel containing hernia. No abdominopelvic ascites. No free air. Musculoskeletal: Unchanged chronic T12 compression deformity with 25% height loss anteriorly. Multilevel degenerative changes of the spine with severe degenerative disc disease and facet arthropathy in the lumbar spine. IMPRESSION: Right-sided hydroureteronephrosis with perinephric and perinephric inflammatory stranding. No obstructing stone is visible within the ureter. Small focus of gas within the right renal pelvis, likely related to recent retrograde pyelogram. There is no stent in the ureter. Recommend urology consultation. Unchanged intermediate density left adnexal lesion measuring up to 6.0 cm, which could be a hemorrhagic cyst. Given the patient is postmenopausal, nonemergent pelvic ultrasound is recommended for further evaluation if not yet performed. Two incidental 5 mm pulmonary nodules in the peripheral lung bases. No follow-up needed if patient is low-risk (and has no known or suspected primary neoplasm). Non-contrast chest CT can be considered in 12 months if patient is high-risk. This recommendation follows the consensus statement: Guidelines for Management of Incidental Pulmonary Nodules Detected on CT Images: From the Fleischner Society 2017; Radiology 2017; 284:228-243. Electronically Signed   By: Maurine Simmering M.D.   On: 06/19/2021 09:45   CT ANGIO HEAD NECK W WO CM  Result Date: 06/20/2021 CLINICAL DATA:  81 year old female code stroke presentation. Left side weakness. EXAM: CT ANGIOGRAPHY HEAD AND NECK TECHNIQUE: Multidetector CT imaging of the head and neck was performed using the standard protocol during bolus administration of intravenous contrast. Multiplanar CT image reconstructions and MIPs were obtained to evaluate the vascular anatomy. Carotid stenosis measurements (when applicable) are obtained utilizing NASCET criteria, using  the distal internal carotid diameter as the denominator. RADIATION DOSE REDUCTION: This exam was performed according to the departmental dose-optimization program which includes automated exposure control, adjustment of the mA and/or kV according to patient size and/or use of iterative reconstruction technique. CONTRAST:  44m OMNIPAQUE IOHEXOL 350 MG/ML SOLN COMPARISON:  Plain head CT 0318 hours. FINDINGS: CTA NECK Skeleton: Osteopenia. Chronic lower cervical spine disc and endplate degeneration. Multilevel mild upper thoracic compression fractures, such as T3 and T4, appears stable from a 2021 cervical spine CT. No acute osseous abnormality identified. Upper chest: Mild dependent atelectasis. Mild retained secretions in the proximal thoracic esophagus which is nondilated. No visible mediastinal lymphadenopathy. Visible central pulmonary arteries appear patent. Other neck: Right supraclavicular venous collaterals appear incidental. Retropharyngeal course of both carotids (normal variant). No acute neck soft tissue finding. Aortic arch: Mild Calcified aortic atherosclerosis.  Three vessel arch configuration. Right carotid system: Mild brachiocephalic artery calcified plaque without stenosis. Normal right CCA origin. Tortuous right CCA with a retropharyngeal carotid bifurcation. Mild right ICA origin and bulb calcified plaque without stenosis. Left carotid system: Negative left CCA origin. Tortuous left CCA with a retropharyngeal course similar to the right. Mild calcified plaque at the left ICA origin and bulb without stenosis. Vertebral arteries: Mild calcified plaque in the proximal right subclavian artery without stenosis. Minimal calcified plaque at the right vertebral artery origin without stenosis. Right vertebral artery appears mildly non dominant and patent to the skull base without stenosis. Mild proximal left subclavian artery calcified plaque without stenosis. Normal left vertebral artery origin. Dominant  left vertebral artery is tortuous in the V2 segment and patent to the skull base without plaque or stenosis. CTA HEAD Posterior circulation: Normal distal vertebral arteries and vertebrobasilar junction. Dominant left V4 segment. Normal PICA origins. Patent basilar artery without stenosis. Normal SCA and left PCA origin. Fetal type right PCA origin. Left posterior communicating artery diminutive or absent. Bilateral PCA branches are within normal limits. Anterior circulation: Both ICA siphons are patent and tortuous. Mild to moderate calcified plaque on the left but only mild left siphon stenosis. Similar calcified plaque on the right with no significant stenosis. Normal right posterior communicating artery origin. Small infundibulum of the distal left ICA (normal variant). Patent carotid termini. Normal MCA and ACA origins. Normal anterior communicating artery. Bilateral ACA branches are within normal limits. Left MCA M1 segment and bifurcation are patent without stenosis. Right MCA M1 segment and bifurcation are patent without stenosis. Bilateral MCA branches are within normal limits. Venous sinuses: Patent. Anatomic variants: Dominant left vertebral artery. Fetal type right PCA origin. Review of the MIP images confirms the above findings IMPRESSION: 1. Negative for large vessel occlusion. And generally mild for age atherosclerosis in the head and neck, most pronounced at the ICA siphons. No significant arterial stenosis. 2. Aortic Atherosclerosis (ICD10-I70.0). 3. Osteopenia with multiple mild chronic upper thoracic compression fractures. Electronically Signed   By: Genevie Ann M.D.   On: 06/20/2021 04:16   MR BRAIN WO CONTRAST  Result Date: 06/20/2021 CLINICAL DATA:  81 year old female code stroke presentation. Left side weakness. EXAM: MRI HEAD WITHOUT CONTRAST TECHNIQUE: Multiplanar, multiecho pulse sequences of the brain and surrounding structures were obtained without intravenous contrast. COMPARISON:  Brain  MRI 09/14/2012. FINDINGS: The examination had to be discontinued prior to completion due to patient back pain. Only DWI, axial T2* and FLAIR imaging was obtained. Mildly motion degraded axial and coronal DWI imaging demonstrates multifocal, widely scattered, generally small foci of restricted diffusion in the bilateral cerebral and cerebellar hemispheres. Most confluent area is in the right centrum semiovale near the pre motor area on series 6, image 40. Essentially all vascular territories affected. Deep gray matter nuclei and brainstem are spared. No intracranial mass effect. Stable ventricle size and configuration. Basilar cisterns appear normal. Patchy additional bilateral cerebral white matter and central pontine FLAIR heterogeneity appears increased since 2014. No evidence of acute or chronic intracranial hemorrhage. IMPRESSION: 1. Small acute infarcts - most punctate - scattered throughout the bilateral cerebral and cerebellar hemispheres in keeping with a recent Embolic event from the heart or proximal aorta. Largest area of involvement is in the right frontal lobe pre-motor white matter area. No associated mass effect or hemorrhage. 2. Truncated exam, only three imaging sequences obtained. Electronically Signed   By: Genevie Ann M.D.   On: 06/20/2021 08:30  DG OR UROLOGY CYSTO IMAGE ([facility] ONLY)  Result Date: 06/18/2021 There is no interpretation for this exam.  This order is for images obtained during a surgical procedure.  Please See "Surgeries" Tab for more information regarding the procedure.   CT HEMATURIA WORKUP  Result Date: 06/01/2021 CLINICAL DATA:  Gross hematuria in a 81 year old female. EXAM: CT ABDOMEN AND PELVIS WITHOUT AND WITH CONTRAST TECHNIQUE: Multidetector CT imaging of the abdomen and pelvis was performed following the standard protocol before and following the bolus administration of intravenous contrast. RADIATION DOSE REDUCTION: This exam was performed according to the  departmental dose-optimization program which includes automated exposure control, adjustment of the mA and/or kV according to patient size and/or use of iterative reconstruction technique. CONTRAST:  144m OMNIPAQUE IOHEXOL 300 MG/ML  SOLN COMPARISON:  Sep 24, 2020. FINDINGS: Lower chest: Incidental imaging of the lung bases without sign of effusion or consolidative change. 5 mm LEFT upper lobe pulmonary nodule (image 2/9) not imaged on previous imaging studies. No consolidation. No pleural effusion. Heart size unremarkable though incompletely imaged. Hepatobiliary: No focal, suspicious hepatic lesion. No pericholecystic stranding. No biliary duct dilation. Portal vein is patent. Pancreas: Normal, without mass, inflammation or ductal dilatation. Spleen: Normal. Adrenals/Urinary Tract: Adrenal glands are normal. Cortical scarring of the bilateral kidneys without signs of hydronephrosis or perinephric stranding. Tiny intrarenal calculus in the interpolar RIGHT kidney measures 2-3 mm. Bilateral renal cysts are noted not substantially changed from prior imaging. No abnormal enhancement along the course of the LEFT or RIGHT ureter but with a small distal RIGHT ureteral calculus (image 64/7) this measures 3 mm. Subtle nodularity is present at the LEFT bladder base (image 72/7) 5 mm. Urinary bladder is collapsed which does limit assessment. Filling defect as well as enhancing area seen at the LEFT bladder base near the UVJ (image 68/13 in addition to the above referenced image. Stomach/Bowel: Normal appendix.  No acute gastrointestinal findings. Vascular/Lymphatic: Aortic atherosclerosis. No sign of aneurysm. Smooth contour of the IVC. There is no gastrohepatic or hepatoduodenal ligament lymphadenopathy. No retroperitoneal or mesenteric lymphadenopathy. No pelvic sidewall lymphadenopathy. Reproductive: Lobulated LEFT adnexal structure with low attenuation but with intermediate density, 39 Hounsfield units measuring 4.3 x  4.2 x 6.3 cm previously 4.8 x 4.6 x 6.0 cm. This does measure greatest craniocaudal dimension. RIGHT adnexa unremarkable by CT. Other: No ascites. Musculoskeletal: No acute bone finding. No destructive bone process. Spinal degenerative changes. Excretory phase: Filling defect of the LEFT posterolateral urinary bladder base near the UVJ as described no visible upper tract lesion. IMPRESSION: 1. Filling defect with enhancement in the LEFT posterolateral urinary bladder base near the LEFT UVJ suspicious for small urothelial neoplasm. Cystoscopy is advised. 2. 3 mm distal RIGHT ureteral calculus without current signs of obstruction. 3. Bilateral renal cysts. 4. 5 mm LEFT upper lobe pulmonary nodule not imaged on previous imaging studies. If bladder findings are compatible with neoplasm [employer] Corporationguidelines would not apply. Suggest close attention on follow-up, if bladder cancer is diagnosed 3 month follow-up of the chest may be warranted. 5. Lobulated LEFT adnexal structure with low attenuation but with intermediate density, 39 Hounsfield units. This does measure greatest craniocaudal dimension. This does measure 4.3 x 4.2 x 6.3 cm previously 4.8 x 4.6 x 6.0 cm. Given that this may represent a hemorrhagic cystic lesion of the adnexa in a postmenopausal patient greater than 6 cm would suggest follow-up pelvic sonogram if not yet performed. 6. Aortic atherosclerosis. Aortic Atherosclerosis (ICD10-I70.0). These results will be called  to the ordering clinician or representative by the Radiologist Assistant, and communication documented in the PACS or [employer]. Electronically Signed   By: Zetta Bills M.D.   On: 06/01/2021 14:31   CT HEAD CODE STROKE WO CONTRAST  Result Date: 06/20/2021 CLINICAL DATA:  Code stroke. EXAM: CT HEAD WITHOUT CONTRAST TECHNIQUE: Contiguous axial images were obtained from the base of the skull through the vertex without intravenous contrast. RADIATION DOSE REDUCTION: This exam  was performed according to the departmental dose-optimization program which includes automated exposure control, adjustment of the mA and/or kV according to patient size and/or use of iterative reconstruction technique. COMPARISON:  Prior CT from 11/02/2019. FINDINGS: Brain: Age-related cerebral atrophy with chronic small vessel ischemic disease. Small parenchymal calcific density at the posterior left frontal periventricular white matter, stable. No acute intracranial hemorrhage. No visible acute large vessel territory infarct. No mass lesion or midline shift. No hydrocephalus or extra-axial fluid collection. Vascular: No hyperdense vessel. Scattered vascular calcifications noted within the carotid siphons. Skull: Scalp soft tissues and calvarium within normal limits. Calvarium intact. Sinuses/Orbits: Globes and orbital soft tissues demonstrate no acute finding. Scattered mucosal thickening noted within the ethmoidal air cells. Paranasal sinuses are otherwise clear. Trace left mastoid effusion noted. Other: None. ASPECTS [facility] Stroke Program Early CT Score) - Ganglionic level infarction (caudate, lentiform nuclei, internal capsule, insula, M1-M3 cortex): 7 - Supraganglionic infarction (M4-M6 cortex): 3 Total score (0-10 with 10 being normal): 10 IMPRESSION: 1. No acute intracranial abnormality. 2. ASPECTS is 10. 3. 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 These results were communicated to the nurse practitioner taking care of the patient, Sharion Settler at 3:32 am on 06/20/2021 by text page via the [facility] messaging system. Electronically Signed   By: Jeannine Boga M.D.   On: 06/20/2021 03:35     The results of significant diagnostics from this hospitalization (including imaging, microbiology, ancillary and laboratory) are listed below for reference.     Microbiology: Recent Results (from the past 240 hour(s))  Urine culture     Status: Abnormal   Collection Time:  06/11/21  3:58 PM   Specimen: Urine, Random  Result Value Ref Range Status   Specimen Description   Final    URINE, RANDOM Performed at [facility], [address] [place], [place] [address]    Special Requests   Final    NONE Performed at [facility], [address] [place], [place] [address]    Culture MULTIPLE SPECIES PRESENT, SUGGEST RECOLLECTION (A)  Final   Report Status 06/13/2021 FINAL  Final  Blood culture (routine x 2)     Status: None (Preliminary result)   Collection Time: 06/19/21 10:23 AM   Specimen: BLOOD  Result Value Ref Range Status   Specimen Description BLOOD BLOOD RIGHT WRIST  Final   Special Requests   Final    BOTTLES DRAWN AEROBIC AND ANAEROBIC Blood Culture results may not be optimal due to an inadequate volume of blood received in culture bottles   Culture   Final    NO GROWTH < 24 HOURS Performed at [facility], [address], [place], [place] [address]    Report Status PENDING  Incomplete  Resp Panel by RT-PCR (Flu A&B, Covid) Nasopharyngeal Swab     Status: None   Collection Time: 06/19/21 10:23 AM   Specimen: Nasopharyngeal Swab; Nasopharyngeal(NP) swabs in vial transport medium  Result Value Ref Range Status   SARS Coronavirus 2 by RT  PCR NEGATIVE NEGATIVE Final    Comment: (NOTE) SARS-CoV-2 target nucleic acids are NOT DETECTED.  The SARS-CoV-2 RNA is generally detectable in upper respiratory specimens during the acute phase of infection. The lowest concentration of SARS-CoV-2 viral copies this assay can detect is 138 copies/mL. A negative result does not preclude SARS-Cov-2 infection and should not be used as the sole basis for treatment or other patient management decisions. A negative result may occur with  improper specimen collection/handling, submission of specimen other than nasopharyngeal swab, presence of viral mutation(s) within the areas targeted by this assay, and inadequate number of  viral copies(<138 copies/mL). A negative result must be combined with clinical observations, patient history, and epidemiological information. The expected result is Negative.  Fact Sheet for Patients:  EntrepreneurPulse.com.au  Fact Sheet for Healthcare Providers:  IncredibleEmployment.be  This test is no t yet approved or cleared by the [place] FDA and  has been authorized for detection and/or diagnosis of SARS-CoV-2 by FDA under an Emergency Use Authorization (EUA). This EUA will remain  in effect (meaning this test can be used) for the duration of the COVID-19 declaration under Section 564(b)(1) of the Act, 21 U.S.C.section 360bbb-3(b)(1), unless the authorization is terminated  or revoked sooner.       Influenza A by PCR NEGATIVE NEGATIVE Final   Influenza B by PCR NEGATIVE NEGATIVE Final    Comment: (NOTE) The Xpert Xpress SARS-CoV-2/FLU/RSV plus assay is intended as an aid in the diagnosis of influenza from Nasopharyngeal swab specimens and should not be used as a sole basis for treatment. Nasal washings and aspirates are unacceptable for Xpert Xpress SARS-CoV-2/FLU/RSV testing.  Fact Sheet for Patients: EntrepreneurPulse.com.au  Fact Sheet for Healthcare Providers: IncredibleEmployment.be  This test is not yet approved or cleared by the [place] FDA and has been authorized for detection and/or diagnosis of SARS-CoV-2 by FDA under an Emergency Use Authorization (EUA). This EUA will remain in effect (meaning this test can be used) for the duration of the COVID-19 declaration under Section 564(b)(1) of the Act, 21 U.S.C. section 360bbb-3(b)(1), unless the authorization is terminated or revoked.  Performed at [facility], [address] [place], [place] [address]   Blood culture (routine x 2)     Status: None (Preliminary result)   Collection Time: 06/19/21  2:27 PM    Specimen: BLOOD  Result Value Ref Range Status   Specimen Description BLOOD RIGHT ANTECUBITAL  Final   Special Requests   Final    BOTTLES DRAWN AEROBIC AND ANAEROBIC Blood Culture adequate volume   Culture   Final    NO GROWTH < 24 HOURS Performed at [facility], [address] [place], [place] [address]    Report Status PENDING  Incomplete     Labs: BNP (last 3 results) No results for input(s): BNP in the last 8760 hours. Basic Metabolic Panel: Recent Labs  Lab 06/19/21 0814 06/20/21 0608  NA 138 138  K 4.1 4.4  CL 100 102  CO2 28 26  GLUCOSE 149* 101*  BUN 33* 35*  CREATININE 1.60* 0.74  CALCIUM 9.4 9.0  MG 2.0 2.0  PHOS  --  2.2*   Liver Function Tests: Recent Labs  Lab 06/19/21 0814 06/20/21 0608 06/20/21 0654  AST 29 33  --   ALT 18 18  --   ALKPHOS 38 32*  --   BILITOT 1.1 1.2 1.0  PROT 7.3 6.4*  --   ALBUMIN 4.3 3.7  --  Recent Labs  Lab 06/19/21 0814  LIPASE 32   No results for input(s): AMMONIA in the last 168 hours. CBC: Recent Labs  Lab 06/16/21 1417 06/19/21 0814 06/20/21 0429  WBC 8.1 14.7* 11.9*  NEUTROABS  --  12.5*  --   HGB 16.8* 15.9* 14.8  HCT 50.2* 48.2* 45.5  MCV 97.1 98.0 98.9  PLT 100* 89* 27*   Cardiac Enzymes: No results for input(s): CKTOTAL, CKMB, CKMBINDEX, TROPONINI in the last 168 hours. BNP: Invalid input(s): POCBNP CBG: Recent Labs  Lab 06/20/21 0253 06/20/21 0816  GLUCAP 116* 117*   D-Dimer No results for input(s): DDIMER in the last 72 hours. Hgb A1c No results for input(s): HGBA1C in the last 72 hours. Lipid Profile Recent Labs    06/20/21 0429  CHOL 140  HDL 52  LDLCALC 65  TRIG 113  CHOLHDL 2.7   Thyroid function studies No results for input(s): TSH, T4TOTAL, T3FREE, THYROIDAB in the last 72 hours.  Invalid input(s): FREET3 Anemia work up Recent Labs    06/20/21 0608  VITAMINB12 592   Urinalysis    Component Value Date/Time   COLORURINE RED (A) 06/19/2021 0814    APPEARANCEUR TURBID (A) 06/19/2021 0814   LABSPEC 1.020 06/19/2021 0814   PHURINE  06/19/2021 0814    TEST NOT REPORTED DUE TO COLOR INTERFERENCE OF URINE PIGMENT   GLUCOSEU (A) 06/19/2021 0814    TEST NOT REPORTED DUE TO COLOR INTERFERENCE OF URINE PIGMENT   HGBUR (A) 06/19/2021 0814    TEST NOT REPORTED DUE TO COLOR INTERFERENCE OF URINE PIGMENT   BILIRUBINUR (A) 06/19/2021 0814    TEST NOT REPORTED DUE TO COLOR INTERFERENCE OF URINE PIGMENT   KETONESUR (A) 06/19/2021 0814    TEST NOT REPORTED DUE TO COLOR INTERFERENCE OF URINE PIGMENT   PROTEINUR (A) 06/19/2021 0814    TEST NOT REPORTED DUE TO COLOR INTERFERENCE OF URINE PIGMENT   UROBILINOGEN 1.0 08/19/2013 1225   NITRITE (A) 06/19/2021 0814    TEST NOT REPORTED DUE TO COLOR INTERFERENCE OF URINE PIGMENT   LEUKOCYTESUR (A) 06/19/2021 0814    TEST NOT REPORTED DUE TO COLOR INTERFERENCE OF URINE PIGMENT   Sepsis Labs Invalid input(s): PROCALCITONIN,  WBC,  LACTICIDVEN Microbiology Recent Results (from the past 240 hour(s))  Urine culture     Status: Abnormal   Collection Time: 06/11/21  3:58 PM   Specimen: Urine, Random  Result Value Ref Range Status   Specimen Description   Final    URINE, RANDOM Performed at [facility], [address], [place], [place] [address]    Special Requests   Final    NONE Performed at [facility], [address] [place], [place] [address]    Culture MULTIPLE SPECIES PRESENT, SUGGEST RECOLLECTION (A)  Final   Report Status 06/13/2021 FINAL  Final  Blood culture (routine x 2)     Status: None (Preliminary result)   Collection Time: 06/19/21 10:23 AM   Specimen: BLOOD  Result Value Ref Range Status   Specimen Description BLOOD BLOOD RIGHT WRIST  Final   Special Requests   Final    BOTTLES DRAWN AEROBIC AND ANAEROBIC Blood Culture results may not be optimal due to an inadequate volume of blood received in culture bottles   Culture   Final    NO GROWTH < 24  HOURS Performed at [facility], [address] [place], [place] [address]    Report Status PENDING  Incomplete  Resp  Panel by RT-PCR (Flu A&B, Covid) Nasopharyngeal Swab     Status: None   Collection Time: 06/19/21 10:23 AM   Specimen: Nasopharyngeal Swab; Nasopharyngeal(NP) swabs in vial transport medium  Result Value Ref Range Status   SARS Coronavirus 2 by RT PCR NEGATIVE NEGATIVE Final    Comment: (NOTE) SARS-CoV-2 target nucleic acids are NOT DETECTED.  The SARS-CoV-2 RNA is generally detectable in upper respiratory specimens during the acute phase of infection. The lowest concentration of SARS-CoV-2 viral copies this assay can detect is 138 copies/mL. A negative result does not preclude SARS-Cov-2 infection and should not be used as the sole basis for treatment or other patient management decisions. A negative result may occur with  improper specimen collection/handling, submission of specimen other than nasopharyngeal swab, presence of viral mutation(s) within the areas targeted by this assay, and inadequate number of viral copies(<138 copies/mL). A negative result must be combined with clinical observations, patient history, and epidemiological information. The expected result is Negative.  Fact Sheet for Patients:  EntrepreneurPulse.com.au  Fact Sheet for Healthcare Providers:  IncredibleEmployment.be  This test is no t yet approved or cleared by the [place] FDA and  has been authorized for detection and/or diagnosis of SARS-CoV-2 by FDA under an Emergency Use Authorization (EUA). This EUA will remain  in effect (meaning this test can be used) for the duration of the COVID-19 declaration under Section 564(b)(1) of the Act, 21 U.S.C.section 360bbb-3(b)(1), unless the authorization is terminated  or revoked sooner.       Influenza A by PCR NEGATIVE NEGATIVE Final   Influenza B by PCR NEGATIVE NEGATIVE Final     Comment: (NOTE) The Xpert Xpress SARS-CoV-2/FLU/RSV plus assay is intended as an aid in the diagnosis of influenza from Nasopharyngeal swab specimens and should not be used as a sole basis for treatment. Nasal washings and aspirates are unacceptable for Xpert Xpress SARS-CoV-2/FLU/RSV testing.  Fact Sheet for Patients: EntrepreneurPulse.com.au  Fact Sheet for Healthcare Providers: IncredibleEmployment.be  This test is not yet approved or cleared by the [place] FDA and has been authorized for detection and/or diagnosis of SARS-CoV-2 by FDA under an Emergency Use Authorization (EUA). This EUA will remain in effect (meaning this test can be used) for the duration of the COVID-19 declaration under Section 564(b)(1) of the Act, 21 U.S.C. section 360bbb-3(b)(1), unless the authorization is terminated or revoked.  Performed at [facility], [address] [place], [place] [address]   Blood culture (routine x 2)     Status: None (Preliminary result)   Collection Time: 06/19/21  2:27 PM   Specimen: BLOOD  Result Value Ref Range Status   Specimen Description BLOOD RIGHT ANTECUBITAL  Final   Special Requests   Final    BOTTLES DRAWN AEROBIC AND ANAEROBIC Blood Culture adequate volume   Culture   Final    NO GROWTH < 24 HOURS Performed at [facility], [address] [place], [place] [address]    Report Status PENDING  Incomplete     Time coordinating discharge:  I have spent 35 minutes face to face with the patient and on the ward discussing the patients care, assessment, plan and disposition with other care givers. >50% of the time was devoted counseling the patient about the risks and benefits of treatment/Discharge disposition and coordinating care.   SIGNED:   Damita Lack, MD  [facility] 06/20/2021, 11:17 AM   If 7PM-7AM, please contact night-coverage

## 2021-06-20 NOTE — H&P (Addendum)
ADMISSION HISTORY AND PHYSICAL   [name] DDU:[id number] DOB: June 03, 1940 DOA: 06/20/2021  PCP: Kirk Ruths, MD Patient coming from: [facility]   Chief Complaint: need for plasmapheresis   HPI:  81yo female with history of DVT/PE, TTP, COPD, tobacco use, and depression who recently underwent cystoscopy with lithotripsy requiring stent placement.  Stent became dislodged during a coughing spell a day prior to admission, leading to right-sided flank pain.  She presented to the [facility] ED where she was found to have pyelonephritis and started on IV Rocephin.   Overnight 2/18 > 2/19 while at [facility] the patient developed left-sided weakness and code stroke was called.  Her symptoms soon after started improving, though not fully resolving. CT head/CTa head did not note any acute findings or large vessel occlusions. MRI was consistent with small multiple infarcts in B hemispheres. Her platelets trended down abruptly from 89K > 24K > 16K. It was suspected that she was in a TTP flare therefore arrangements were made to transfer her to [facility] for plasmapheresis.   Assessment/Plan  TTP w/ acute exacerbation Coordinated effort by Dr. Reesa Chew accomplished transfer to [facility] for plasmaphoresis - HD cath to be placed by IR, and Onc has already entered orders to begin phoresis - multiple physicians across multiple campuses have worked hard to coordinate this timely care    Acute left-sided weakness CTA neck unremarkable for any large vessel occlusion - MRI noted multiple B acute small multifocal infarcts - felt to be due to TTP - avoid ASA/Plavix due to TTP - PT/OT/SLP when able to participate    Acute complicated pyelonephritis underwent recent ureteroscopy, laser lithotripsy and stent placement - stent dislodged day prior to admit - CT revealed right-sided hydronephrosis with perinephric stranding - was seen by Urology at [facility] who recommended abx tx only    Nephrolithiasis Status post  recent ureteroscopy as noted above    Nicotine dependence nicotine patch   COPD  Well compensated at this time     DVT prophylaxis: SCDs Code Status: FULL  Family Communication: spoke to husband and daughter at bedside   Disposition Plan:  Admit to Inpatient  Consults called: none indicated  Review of Systems: As per HPI otherwise 10 point review of systems negative.   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   History of TTP (thrombotic thrombocytopenic purpura)    Hypokalemia 08/08/2013   Steroid related   Normal echocardiogram 03/15/2011   Oral thrush 08/08/2013   Septicemia 03/2011   Spinal stenosis, lumbar    Tobacco abuse 05/10/2013    Past Surgical History:  Procedure Laterality Date   BACK SURGERY     BLADDER INSTILLATION N/A 06/18/2021   Procedure: BLADDER INSTILLATION OF GEMCITABINE;  Surgeon: Billey Co, MD;  Location: [facility] ORS;  Service: Urology;  Laterality: N/A;   CATARACT EXTRACTION W/ INTRAOCULAR LENS  IMPLANT, BILATERAL  ~ 2010  CESAREAN SECTION  10/09/1976   CYSTOSCOPY/URETEROSCOPY/HOLMIUM LASER/STENT PLACEMENT Right 06/18/2021   Procedure: CYSTOSCOPY/URETEROSCOPY/HOLMIUM LASER/STENT PLACEMENT;  Surgeon: Billey Co, MD;  Location: [facility] ORS;  Service: Urology;  Laterality: Right;   ESOPHAGOGASTRODUODENOSCOPY (EGD) WITH PROPOFOL N/A 09/27/2020   Procedure: ESOPHAGOGASTRODUODENOSCOPY (EGD) WITH PROPOFOL;  Surgeon: Ronald Lobo, MD;  Location: [facility];   Service: Endoscopy;  Laterality: N/A;   EYE SURGERY     INSERTION OF DIALYSIS CATHETER Left 07/12/2013   Procedure: INSERTION OF DIALYSIS CATHETER   with Ultrasound;  Surgeon: Rosetta Posner, MD;  Location: [facility];  Service: Vascular;  Laterality: Left;   IR PERC TUN PERIT CATH WO PORT S&I /IMAG  07/16/2020   IR RADIOLOGIST EVAL & MGMT  09/26/2020   IR US GUIDE VASC ACCESS LEFT  07/16/2020   LUMBAR LAMINECTOMY/DECOMPRESSION MICRODISCECTOMY  03/16/2011   Procedure: LUMBAR LAMINECTOMY/DECOMPRESSION MICRODISCECTOMY;  Surgeon: Elaina Hoops;  Location: [facility];  Service: Neurosurgery;  Laterality: N/A;   TEE WITHOUT CARDIOVERSION  03/21/2011   Procedure: TRANSESOPHAGEAL ECHOCARDIOGRAM (TEE);  Surgeon: Laverda Page;  Location: [facility] ENDOSCOPY;  Service: Cardiovascular;  Laterality: N/A;  TEE for vegetations   TONSILLECTOMY     TRANSURETHRAL RESECTION OF BLADDER TUMOR N/A 06/18/2021   Procedure: TRANSURETHRAL RESECTION OF BLADDER TUMOR (TURBT);  Surgeon: Billey Co, MD;  Location: [facility] ORS;  Service: Urology;  Laterality: N/A;   TUBAL LIGATION      Family History  Family History  Problem Relation Age of Onset   Heart disease Mother    Leukemia Sister    Breast cancer Neg Hx    Colon cancer Neg Hx    Ovarian cancer Neg Hx    Uterine cancer Neg Hx    Cervical cancer Neg Hx     Social History   reports that she has been smoking cigarettes. She has a 165.00 pack-year smoking history. She has never been exposed to tobacco smoke. She has never used smokeless tobacco. She reports current alcohol use of about 7.0 standard drinks per week. She reports that she does not use drugs.  Allergies Allergies  Allergen Reactions   Adhesive [Tape] Other (See Comments)    Blisters, Band-Aids brand rips off the skin- paper tape only!!   Cefuroxime Axetil Anxiety and Other (See Comments)    Made the patient jittery   Cefuroxime Axetil Anxiety and Other (See Comments)    Shaky, amoxicillin ok Made  the patient jittery   Pedi-Pre Tape Spray [Wound Dressing Adhesive] Other (See Comments)    Blisters, band-aide brand  Blisters, Band-Aids brand rips off the skin- paper tape only!!   Other Hives and Other (See Comments)    Wool: Reaction is hives Statistician tape: Reaction is blistering    Sulfa Antibiotics Hives   Sulfa Drugs Cross Reactors Hives    Prior to Admission medications   Medication Sig Start Date End Date Taking? Authorizing Provider  azaTHIOprine (IMURAN) 50 MG tablet Take 2 tablets (100 mg total) by mouth daily. 03/12/21   [name], NP  caplacizumab (CABLIVI) 11 MG KIT injection Inject 11 mg into the skin once for 1 dose. 06/20/21 06/20/21  Amin, Jeanella Flattery, MD  cefTRIAXone 1 g in sodium chloride 0.9 % 100 mL Inject 1 g into the vein daily. 06/21/21   Amin, Jeanella Flattery, MD  cholecalciferol (VITAMIN D) 400 UNITS TABS Take 400 Units by mouth daily.    [provider]  cyanocobalamin 1000 MCG tablet Take 1,000 mcg  by mouth daily.    [provider]  FLUoxetine (PROZAC) 20 MG capsule Take 20 mg by mouth daily.    Kirk Ruths, MD  folic acid (FOLVITE) 1 MG tablet Take 1 mg by mouth daily.    [provider]  gabapentin (NEURONTIN) 600 MG tablet Take 600 mg by mouth 3 (three) times daily as needed (pain).    [provider]  HYDROcodone-acetaminophen (NORCO/VICODIN) 5-325 MG tablet Take 1-2 tablets by mouth every 6 (six) hours as needed for up to 5 days for moderate pain. 06/18/21 06/23/21  Billey Co, MD  latanoprost (XALATAN) 0.005 % ophthalmic solution Place 1 drop into both eyes. 05/31/21   [provider]  methylPREDNISolone sodium succinate (SOLU-MEDROL) 125 mg/2 mL injection Inject 2 mLs (125 mg total) into the vein every 8 (eight) hours. 06/20/21   Amin, Jeanella Flattery, MD  Multiple Vitamin (MULTIVITAMIN) tablet Take 1 tablet by mouth daily.     [provider]  Nicotine (NICODERM CQ TD)  Place 1 patch onto the skin daily as needed (when in hospital). Patient not taking: Reported on 06/16/2021    [provider]  polyethylene glycol (MIRALAX / GLYCOLAX) packet Take 17 g by mouth daily. Patient taking differently: Take 17 g by mouth daily as needed for moderate constipation. 04/08/17   Earleen Newport, MD  polyvinyl alcohol (LIQUIFILM TEARS) 1.4 % ophthalmic solution Place 1 drop into both eyes as needed for dry eyes.    [provider]  predniSONE (DELTASONE) 5 MG tablet Take 1 tablet (5 mg total) by mouth daily with breakfast. 05/26/21   Ladell Pier, MD  QUEtiapine (SEROQUEL) 25 MG tablet Take 25 mg by mouth at bedtime.    [provider]  rosuvastatin (CRESTOR) 20 MG tablet Take 20 mg by mouth at bedtime. 05/22/20   [provider]  tiotropium (SPIRIVA) 18 MCG inhalation capsule Place 18 mcg into inhaler and inhale daily.    [provider]  traMADol (ULTRAM) 50 MG tablet Take 1 tablet (50 mg total) by mouth 2 (two) times daily. Patient taking differently: Take 50 mg by mouth every 6 (six) hours as needed for moderate pain. 09/27/20   Samuella Cota, MD    Physical Exam: Vitals:   06/20/21 1502 06/20/21 1657 06/20/21 1700  BP: 116/61 (!) 125/59 (!) 125/59  Pulse: 82 81 82  Resp: '20 20 20  ' Temp: 98.8 F (37.1 C) 98.5 F (36.9 C) 98.8 F (37.1 C)  TempSrc: Oral Oral Oral  SpO2: 93% 96% 92%  Height: '5\' 4"'  (1.626 m)      General: No acute respiratory distress Lungs: Clear to auscultation bilaterally without wheezes or crackles Cardiovascular: Regular rate and rhythm without murmur gallop or rub normal S1 and S2 Abdomen: Nontender, nondistended, soft, bowel sounds positive, no rebound, no ascites, no appreciable mass Extremities: No significant cyanosis, clubbing, or edema bilateral lower extremities  Labs on Admission:   CBC: Recent Labs  Lab 06/16/21 1417 06/19/21 0814 06/20/21 0429 06/20/21 1056  WBC 8.1  14.7* 11.9* 11.0*  NEUTROABS  --  12.5*  --  8.4*  HGB 16.8* 15.9* 14.8 14.7  HCT 50.2* 48.2* 45.5 45.1  MCV 97.1 98.0 98.9 99.6  PLT 100* 89* 27* 16*   Basic Metabolic Panel: Recent Labs  Lab 06/19/21 0814 06/20/21 0608  NA 138 138  K 4.1 4.4  CL 100 102  CO2 28 26  GLUCOSE 149* 101*  BUN 33* 35*  CREATININE 1.60* 0.74  CALCIUM 9.4 9.0  MG 2.0 2.0  PHOS  --  2.2*   GFR: Estimated Creatinine Clearance: 58.8 mL/min (by C-G formula based on SCr of 0.74 mg/dL). Liver Function Tests: Recent Labs  Lab 06/19/21 0814 06/20/21 0608 06/20/21 0654  AST 29 33  --   ALT 18 18  --   ALKPHOS 38 32*  --   BILITOT 1.1 1.2 1.0  PROT 7.3 6.4*  --   ALBUMIN 4.3 3.7  --    Recent Labs  Lab 06/19/21 0814  LIPASE 32   Coagulation Profile: Recent Labs  Lab 06/20/21 0608  INR 1.3*   HbA1C: Recent Labs    06/20/21 0608  HGBA1C 5.3    Lipid Profile: Recent Labs    06/20/21 0429  CHOL 140  HDL 52  LDLCALC 65  TRIG 113  CHOLHDL 2.7    Anemia Panel: Recent Labs    06/20/21 0608  [id number]    Radiological Exams on Admission: Exams available in [place] - reviewed by this MD   Cherene Altes, MD [facility] Office  [phone] Pager - Text Page per Amion as per below:  On-Call/Text Page:      [email or web address]  If 7PM-7AM, please contact night-coverage [email or web address] 06/20/2021, 6:18 PM

## 2021-06-20 NOTE — Procedures (Signed)
Interventional Radiology Procedure:   Indications: TTP relapse and needs plasma exchange  Procedure: Placement of non-tunneled dialysis catheter / pheresis catheter  Findings: Known right IJ occlusion.  Left IJ looked occluded with [place].  Catheter placed in left EJ.  24 cm catheter, tip at SVC/RA junction.  Complications: No immediate complications noted.     EBL: Minimal  Plan: Catheter is ready to use.    Aiden Helzer R. Anselm Pancoast, MD  Pager: [phone]

## 2021-06-20 NOTE — Progress Notes (Signed)
Pt arrived to [place] from [facility]. VSS. CHG bath done. Pt A&Ox4. Husband at bedside. Pt oriented to room. Call light in reach.  Raelyn Number, RN

## 2021-06-20 NOTE — Assessment & Plan Note (Addendum)
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  Oncology, Dr. Marin Olp has been consulted at [facility].  IR, Dr. Anselm Pancoast will place a temporary dialysis catheter once patient arrives at [facility].  Dr. Joylene Grapes from nephrology will help arrange plasmapheresis (will not need official consult but will help coorinate with HD RN) Dr Yevette Edwards from [place] at [facility] arranging for appropriate TTP meds in the meantime- IV Solumedrol, [place] with Dr Learta Codding Her home meds include-azathioprine, prednisone, folic acid

## 2021-06-20 NOTE — Progress Notes (Addendum)
Pt. is alert and oriented. No signs of acute distress at this time.VS stable. NP (Miss Randol Kern) on call is aware of lab result and  made aware about patient's family request to reach out to Dr. Migdalia Dk for recommendation regarding platelets result.

## 2021-06-20 NOTE — Consult Note (Signed)
[place] NOTE  Patient Care Team: Kirk Ruths, MD as PCP - General (Unknown Physician Specialty)  CHIEF COMPLAINTS/PURPOSE OF CONSULTATION: Acute TTP  HISTORY OF PRESENTING ILLNESS:  [name] 81 y.o.  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  On a chronic basis patient is on prednisone 5 mg at home; Imuran.  As reported per the daughter-patient had 5 doses of Cablivi-which she stopped in May 2022-because of severe anemia.   Patient had recent lithotripsy status post right ureteral stent placement; also status post recent bladder biopsy with fulguration.  Patient came to the emergency room with worsening back pain along with hematuria.  No fever no chills noted.  On CT scan abdomen pelvis-noted to have right-sided hydro-ureteral nephrosis; without stent [dislodged ureteral stent]; pyelonephritis started on perinephric stranding.  Patient started on IV Rocephin.  However overnight patient noted to have neurologic deficits which prompted further evaluation to telemetry neurology.  MRI brain-this morning suggestive of acute embolic stroke.   As per the daughter patient is confused intermittently.  Also noted to have low platelet count-30 on admission hemoglobin was normal.  LDH elevated.  Given the low platelet count; antiplatelet therapy is on hold; subcu heparin on hold.  Hematology has been consulted for further evaluation recommendations.  Review of Systems  Unable to perform ROS: Medical condition    MEDICAL HISTORY:  Past Medical History:  Diagnosis Date   Abnormal laboratory test 09/10/2013   Persistent elevation LDH   Acute delirium    Adnexal cyst 09/25/2020   Anemia    Arthritis    Bacteremia 03/2011   Blood dyscrasia    Blood transfusion    Bronchitis, chronic obstructive w acute bronchitis (HCC) 08/12/2013   Chronic back pain    COPD (chronic obstructive  pulmonary disease) (HCC)    Depression    "mild"   Endocarditis    Epidural abscess    Heart murmur    History of plasmapheresis 08/08/2013   Active for 3rd relapse of TTP   History of TTP (thrombotic thrombocytopenic purpura)    Hypokalemia 08/08/2013   Steroid related   Normal echocardiogram 03/15/2011   Oral thrush 08/08/2013   Septicemia 03/2011   Spinal stenosis, lumbar    Tobacco abuse 05/10/2013    SURGICAL HISTORY: Past Surgical History:  Procedure Laterality Date   BACK SURGERY     BLADDER INSTILLATION N/A 06/18/2021   Procedure: BLADDER INSTILLATION OF GEMCITABINE;  Surgeon: Billey Co, MD;  Location: [facility] ORS;  Service: Urology;  Laterality: N/A;   CATARACT EXTRACTION W/ INTRAOCULAR LENS  IMPLANT, BILATERAL  ~ 2010   CESAREAN SECTION  10/09/1976   CYSTOSCOPY/URETEROSCOPY/HOLMIUM LASER/STENT PLACEMENT Right 06/18/2021   Procedure: CYSTOSCOPY/URETEROSCOPY/HOLMIUM LASER/STENT PLACEMENT;  Surgeon: Billey Co, MD;  Location: [facility] ORS;  Service: Urology;  Laterality: Right;   ESOPHAGOGASTRODUODENOSCOPY (EGD) WITH PROPOFOL N/A 09/27/2020   Procedure: ESOPHAGOGASTRODUODENOSCOPY (EGD) WITH PROPOFOL;  Surgeon: Ronald Lobo, MD;  Location: [facility];  Service: Endoscopy;  Laterality: N/A;   EYE SURGERY     INSERTION OF DIALYSIS CATHETER Left 07/12/2013   Procedure: INSERTION OF DIALYSIS CATHETER   with Ultrasound;  Surgeon: Rosetta Posner, MD;  Location: [facility] OR;  Service: Vascular;  Laterality: Left;   IR PERC TUN PERIT CATH WO PORT S&I /IMAG  07/16/2020   IR RADIOLOGIST EVAL & MGMT  09/26/2020   IR US GUIDE VASC ACCESS LEFT  07/16/2020  LUMBAR LAMINECTOMY/DECOMPRESSION MICRODISCECTOMY  03/16/2011   Procedure: LUMBAR LAMINECTOMY/DECOMPRESSION MICRODISCECTOMY;  Surgeon: Elaina Hoops;  Location: [facility];  Service: Neurosurgery;  Laterality: N/A;   TEE WITHOUT CARDIOVERSION  03/21/2011   Procedure: TRANSESOPHAGEAL ECHOCARDIOGRAM (TEE);  Surgeon: Laverda Page;  Location: [facility] ENDOSCOPY;  Service: Cardiovascular;  Laterality: N/A;  TEE for vegetations   TONSILLECTOMY     TRANSURETHRAL RESECTION OF BLADDER TUMOR N/A 06/18/2021   Procedure: TRANSURETHRAL RESECTION OF BLADDER TUMOR (TURBT);  Surgeon: Billey Co, MD;  Location: [facility] ORS;  Service: Urology;  Laterality: N/A;   TUBAL LIGATION      SOCIAL HISTORY: Social History   Socioeconomic History   Marital status: Married    Spouse name: Not on file   Number of children: Not on file   Years of education: Not on file   Highest education level: Not on file  Occupational History   Not on file  Tobacco Use   Smoking status: Every Day    Packs/day: 3.00    Years: 55.00    Pack years: 165.00    Types: Cigarettes    Passive exposure: Never   Smokeless tobacco: Never   Tobacco comments:    Vapors  Vaping Use   Vaping Use: Every day  Substance and Sexual Activity   Alcohol use: Yes    Alcohol/week: 7.0 standard drinks    Types: 7 Cans of beer per week    Comment: Beer a night.   Drug use: No   Sexual activity: Not Currently    Partners: Male  Other Topics Concern   Not on file  Social History Narrative   Not on file   Social Determinants of Health   Financial Resource Strain: Not on file  Food Insecurity: Not on file  Transportation Needs: Not on file  Physical Activity: Not on file  Stress: Not on file  Social Connections: Not on file  Intimate Partner Violence: Not on file    FAMILY HISTORY: Family History  Problem Relation Age of Onset   Heart disease Mother    Leukemia Sister    Breast cancer Neg Hx    Colon cancer Neg Hx    Ovarian cancer Neg Hx    Uterine cancer Neg Hx    Cervical cancer Neg Hx     ALLERGIES:  is allergic to adhesive [tape], cefuroxime axetil, cefuroxime axetil, pedi-pre tape spray [wound dressing adhesive], other, sulfa antibiotics, and sulfa drugs cross reactors.  MEDICATIONS:  No current facility-administered medications for this  encounter.   No current outpatient medications on file.      Marland Kitchen  PHYSICAL EXAMINATION:  Vitals:   06/20/21 1339 06/20/21 1345  BP: (!) 145/65   Pulse: 86 84  Resp: (!) 23 (!) 26  Temp: 98.6 F (37 C)   SpO2: 93% 93%   Filed Weights   06/19/21 0751 06/20/21 0823  Weight: 171 lb 15.3 oz (78 kg) 179 lb 3.7 oz (81.3 kg)    Physical Exam Vitals and nursing note reviewed.  Constitutional:      Comments: Patient resting in the bed.  No acute distress.  She is accompanied by daughter at the bedside.  She is sleepy but easily arousable.  Oriented x1-2.  HENT:     Head: Normocephalic and atraumatic.     Mouth/Throat:     Pharynx: Oropharynx is clear.  Eyes:     Extraocular Movements: Extraocular movements intact.     Pupils: Pupils are equal, round, and  reactive to light.  Cardiovascular:     Rate and Rhythm: Normal rate and regular rhythm.  Pulmonary:     Comments: Decreased breath sounds bilaterally.  Abdominal:     Palpations: Abdomen is soft.  Musculoskeletal:        General: Normal range of motion.     Cervical back: Normal range of motion.  Skin:    General: Skin is warm.  Neurological:     Mental Status: She is alert.     Comments: Weakness in the left upper or lower extremities.  Psychiatric:        Behavior: Behavior normal.        Judgment: Judgment normal.     LABORATORY DATA:  I have reviewed the data as listed Lab Results  Component Value Date   WBC 11.0 (H) 06/20/2021   HGB 14.7 06/20/2021   HCT 45.1 06/20/2021   MCV 99.6 06/20/2021   PLT 16 (LL) 06/20/2021   Recent Labs    06/01/21 1117 06/19/21 0814 06/20/21 0608 06/20/21 0654  NA 141 138 138  --   K 4.8 4.1 4.4  --   CL 105 100 102  --   CO2 30 28 26   --   GLUCOSE 108* 149* 101*  --   BUN 26* 33* 35*  --   CREATININE 1.31* 1.60* 0.74  --   CALCIUM 9.7 9.4 9.0  --   GFRNONAA 42* 33* >60  --   PROT 6.8 7.3 6.4*  --   ALBUMIN 4.0 4.3 3.7  --   AST 19 29 33  --   ALT 13 18 18   --    ALKPHOS 37* 38 32*  --   BILITOT 0.6 1.1 1.2 1.0  BILIDIR  --   --   --  0.2  IBILI  --   --   --  0.8    RADIOGRAPHIC STUDIES: I have personally reviewed the radiological images as listed and agreed with the findings in the report. CT ABDOMEN PELVIS WO CONTRAST  Result Date: 06/19/2021 CLINICAL DATA:  Flank pain, kidney stone suspected EXAM: CT ABDOMEN AND PELVIS WITHOUT CONTRAST TECHNIQUE: Multidetector CT imaging of the abdomen and pelvis was performed following the standard protocol without IV contrast. RADIATION DOSE REDUCTION: This exam was performed according to the departmental dose-optimization program which includes automated exposure control, adjustment of the mA and/or kV according to patient size and/or use of iterative reconstruction technique. COMPARISON:  CT 05/31/2021 FINDINGS: Lower chest: Bibasilar atelectasis. There are 5 mm nodules in the peripheral lung bases (series 3, image 4). Hepatobiliary: No focal liver abnormality is seen. Possible tiny hyperdense stones in the gallbladder fundus. Gallbladder is nondilated. Pancreas: Unremarkable. No pancreatic ductal dilatation or surrounding inflammatory changes. Spleen: Normal in size without focal abnormality. Adrenals/Urinary Tract: Adrenal glands are unremarkable. Punctate nonobstructive left lower pole renal stone. No left-sided hydronephrosis. Unchanged left lower pole cyst. Right-sided hydroureteronephrosis with perinephric and periureteral stranding. There is a small focus of gas within the right renal pelvis (series 2, image 30) likely related to recent retrograde pyelogram. Unchanged right renal cysts. There is no visible obstructing stone in the ureter. The previously seen 3 mm stone in the distal right ureter is no longer visible distant with recent ureteroscopy and lithotripsy. There is no stent in the ureter present. There is gas in the bladder consistent with recent procedure. Stomach/Bowel: Small hiatal hernia. The stomach  is otherwise within normal limits. There is no evidence of bowel obstruction. The  appendix is normal. There are few sigmoid diverticula. Vascular/Lymphatic: Aortoiliac atherosclerotic calcifications. No AAA. No lymphadenopathy. Reproductive: There is a 4.9 x 4.0 x 6.0 cm left adnexal structure with intermediate density, unchanged from prior exam. Other: No bowel containing hernia. No abdominopelvic ascites. No free air. Musculoskeletal: Unchanged chronic T12 compression deformity with 25% height loss anteriorly. Multilevel degenerative changes of the spine with severe degenerative disc disease and facet arthropathy in the lumbar spine. IMPRESSION: Right-sided hydroureteronephrosis with perinephric and perinephric inflammatory stranding. No obstructing stone is visible within the ureter. Small focus of gas within the right renal pelvis, likely related to recent retrograde pyelogram. There is no stent in the ureter. Recommend urology consultation. Unchanged intermediate density left adnexal lesion measuring up to 6.0 cm, which could be a hemorrhagic cyst. Given the patient is postmenopausal, nonemergent pelvic ultrasound is recommended for further evaluation if not yet performed. Two incidental 5 mm pulmonary nodules in the peripheral lung bases. No follow-up needed if patient is low-risk (and has no known or suspected primary neoplasm). Non-contrast chest CT can be considered in 12 months if patient is high-risk. This recommendation follows the consensus statement: Guidelines for Management of Incidental Pulmonary Nodules Detected on CT Images: From the Fleischner Society 2017; Radiology 2017; 284:228-243. Electronically Signed   By: Maurine Simmering M.D.   On: 06/19/2021 09:45   CT ANGIO HEAD NECK W WO CM  Result Date: 06/20/2021 CLINICAL DATA:  81 year old female code stroke presentation. Left side weakness. EXAM: CT ANGIOGRAPHY HEAD AND NECK TECHNIQUE: Multidetector CT imaging of the head and neck was performed  using the standard protocol during bolus administration of intravenous contrast. Multiplanar CT image reconstructions and MIPs were obtained to evaluate the vascular anatomy. Carotid stenosis measurements (when applicable) are obtained utilizing NASCET criteria, using the distal internal carotid diameter as the denominator. RADIATION DOSE REDUCTION: This exam was performed according to the departmental dose-optimization program which includes automated exposure control, adjustment of the mA and/or kV according to patient size and/or use of iterative reconstruction technique. CONTRAST:  58mL OMNIPAQUE IOHEXOL 350 MG/ML SOLN COMPARISON:  Plain head CT 0318 hours. FINDINGS: CTA NECK Skeleton: Osteopenia. Chronic lower cervical spine disc and endplate degeneration. Multilevel mild upper thoracic compression fractures, such as T3 and T4, appears stable from a 2021 cervical spine CT. No acute osseous abnormality identified. Upper chest: Mild dependent atelectasis. Mild retained secretions in the proximal thoracic esophagus which is nondilated. No visible mediastinal lymphadenopathy. Visible central pulmonary arteries appear patent. Other neck: Right supraclavicular venous collaterals appear incidental. Retropharyngeal course of both carotids (normal variant). No acute neck soft tissue finding. Aortic arch: Mild Calcified aortic atherosclerosis. Three vessel arch configuration. Right carotid system: Mild brachiocephalic artery calcified plaque without stenosis. Normal right CCA origin. Tortuous right CCA with a retropharyngeal carotid bifurcation. Mild right ICA origin and bulb calcified plaque without stenosis. Left carotid system: Negative left CCA origin. Tortuous left CCA with a retropharyngeal course similar to the right. Mild calcified plaque at the left ICA origin and bulb without stenosis. Vertebral arteries: Mild calcified plaque in the proximal right subclavian artery without stenosis. Minimal calcified plaque at  the right vertebral artery origin without stenosis. Right vertebral artery appears mildly non dominant and patent to the skull base without stenosis. Mild proximal left subclavian artery calcified plaque without stenosis. Normal left vertebral artery origin. Dominant left vertebral artery is tortuous in the V2 segment and patent to the skull base without plaque or stenosis. CTA HEAD Posterior circulation: Normal distal vertebral  arteries and vertebrobasilar junction. Dominant left V4 segment. Normal PICA origins. Patent basilar artery without stenosis. Normal SCA and left PCA origin. Fetal type right PCA origin. Left posterior communicating artery diminutive or absent. Bilateral PCA branches are within normal limits. Anterior circulation: Both ICA siphons are patent and tortuous. Mild to moderate calcified plaque on the left but only mild left siphon stenosis. Similar calcified plaque on the right with no significant stenosis. Normal right posterior communicating artery origin. Small infundibulum of the distal left ICA (normal variant). Patent carotid termini. Normal MCA and ACA origins. Normal anterior communicating artery. Bilateral ACA branches are within normal limits. Left MCA M1 segment and bifurcation are patent without stenosis. Right MCA M1 segment and bifurcation are patent without stenosis. Bilateral MCA branches are within normal limits. Venous sinuses: Patent. Anatomic variants: Dominant left vertebral artery. Fetal type right PCA origin. Review of the MIP images confirms the above findings IMPRESSION: 1. Negative for large vessel occlusion. And generally mild for age atherosclerosis in the head and neck, most pronounced at the ICA siphons. No significant arterial stenosis. 2. Aortic Atherosclerosis (ICD10-I70.0). 3. Osteopenia with multiple mild chronic upper thoracic compression fractures. Electronically Signed   By: Genevie Ann M.D.   On: 06/20/2021 04:16   MR BRAIN WO CONTRAST  Result Date:  06/20/2021 CLINICAL DATA:  81 year old female code stroke presentation. Left side weakness. EXAM: MRI HEAD WITHOUT CONTRAST TECHNIQUE: Multiplanar, multiecho pulse sequences of the brain and surrounding structures were obtained without intravenous contrast. COMPARISON:  Brain MRI 09/14/2012. FINDINGS: The examination had to be discontinued prior to completion due to patient back pain. Only DWI, axial T2* and FLAIR imaging was obtained. Mildly motion degraded axial and coronal DWI imaging demonstrates multifocal, widely scattered, generally small foci of restricted diffusion in the bilateral cerebral and cerebellar hemispheres. Most confluent area is in the right centrum semiovale near the pre motor area on series 6, image 40. Essentially all vascular territories affected. Deep gray matter nuclei and brainstem are spared. No intracranial mass effect. Stable ventricle size and configuration. Basilar cisterns appear normal. Patchy additional bilateral cerebral white matter and central pontine FLAIR heterogeneity appears increased since 2014. No evidence of acute or chronic intracranial hemorrhage. IMPRESSION: 1. Small acute infarcts - most punctate - scattered throughout the bilateral cerebral and cerebellar hemispheres in keeping with a recent Embolic event from the heart or proximal aorta. Largest area of involvement is in the right frontal lobe pre-motor white matter area. No associated mass effect or hemorrhage. 2. Truncated exam, only three imaging sequences obtained. Electronically Signed   By: Genevie Ann M.D.   On: 06/20/2021 08:30   DG OR UROLOGY CYSTO IMAGE ([facility] ONLY)  Result Date: 06/18/2021 There is no interpretation for this exam.  This order is for images obtained during a surgical procedure.  Please See "Surgeries" Tab for more information regarding the procedure.   CT HEMATURIA WORKUP  Result Date: 06/01/2021 CLINICAL DATA:  Gross hematuria in a 81 year old female. EXAM: CT ABDOMEN AND PELVIS  WITHOUT AND WITH CONTRAST TECHNIQUE: Multidetector CT imaging of the abdomen and pelvis was performed following the standard protocol before and following the bolus administration of intravenous contrast. RADIATION DOSE REDUCTION: This exam was performed according to the departmental dose-optimization program which includes automated exposure control, adjustment of the mA and/or kV according to patient size and/or use of iterative reconstruction technique. CONTRAST:  169mL OMNIPAQUE IOHEXOL 300 MG/ML  SOLN COMPARISON:  Sep 24, 2020. FINDINGS: Lower chest: Incidental imaging of the  lung bases without sign of effusion or consolidative change. 5 mm LEFT upper lobe pulmonary nodule (image 2/9) not imaged on previous imaging studies. No consolidation. No pleural effusion. Heart size unremarkable though incompletely imaged. Hepatobiliary: No focal, suspicious hepatic lesion. No pericholecystic stranding. No biliary duct dilation. Portal vein is patent. Pancreas: Normal, without mass, inflammation or ductal dilatation. Spleen: Normal. Adrenals/Urinary Tract: Adrenal glands are normal. Cortical scarring of the bilateral kidneys without signs of hydronephrosis or perinephric stranding. Tiny intrarenal calculus in the interpolar RIGHT kidney measures 2-3 mm. Bilateral renal cysts are noted not substantially changed from prior imaging. No abnormal enhancement along the course of the LEFT or RIGHT ureter but with a small distal RIGHT ureteral calculus (image 64/7) this measures 3 mm. Subtle nodularity is present at the LEFT bladder base (image 72/7) 5 mm. Urinary bladder is collapsed which does limit assessment. Filling defect as well as enhancing area seen at the LEFT bladder base near the UVJ (image 68/13 in addition to the above referenced image. Stomach/Bowel: Normal appendix.  No acute gastrointestinal findings. Vascular/Lymphatic: Aortic atherosclerosis. No sign of aneurysm. Smooth contour of the IVC. There is no  gastrohepatic or hepatoduodenal ligament lymphadenopathy. No retroperitoneal or mesenteric lymphadenopathy. No pelvic sidewall lymphadenopathy. Reproductive: Lobulated LEFT adnexal structure with low attenuation but with intermediate density, 39 Hounsfield units measuring 4.3 x 4.2 x 6.3 cm previously 4.8 x 4.6 x 6.0 cm. This does measure greatest craniocaudal dimension. RIGHT adnexa unremarkable by CT. Other: No ascites. Musculoskeletal: No acute bone finding. No destructive bone process. Spinal degenerative changes. Excretory phase: Filling defect of the LEFT posterolateral urinary bladder base near the UVJ as described no visible upper tract lesion. IMPRESSION: 1. Filling defect with enhancement in the LEFT posterolateral urinary bladder base near the LEFT UVJ suspicious for small urothelial neoplasm. Cystoscopy is advised. 2. 3 mm distal RIGHT ureteral calculus without current signs of obstruction. 3. Bilateral renal cysts. 4. 5 mm LEFT upper lobe pulmonary nodule not imaged on previous imaging studies. If bladder findings are compatible with neoplasm [employer] guidelines would not apply. Suggest close attention on follow-up, if bladder cancer is diagnosed 3 month follow-up of the chest may be warranted. 5. Lobulated LEFT adnexal structure with low attenuation but with intermediate density, 39 Hounsfield units. This does measure greatest craniocaudal dimension. This does measure 4.3 x 4.2 x 6.3 cm previously 4.8 x 4.6 x 6.0 cm. Given that this may represent a hemorrhagic cystic lesion of the adnexa in a postmenopausal patient greater than 6 cm would suggest follow-up pelvic sonogram if not yet performed. 6. Aortic atherosclerosis. Aortic Atherosclerosis (ICD10-I70.0). These results will be called to the ordering clinician or representative by the Radiologist Assistant, and communication documented in the PACS or [employer]. Electronically Signed   By: Zetta Bills M.D.   On: 06/01/2021 14:31    CT HEAD CODE STROKE WO CONTRAST  Result Date: 06/20/2021 CLINICAL DATA:  Code stroke. EXAM: CT HEAD WITHOUT CONTRAST TECHNIQUE: Contiguous axial images were obtained from the base of the skull through the vertex without intravenous contrast. RADIATION DOSE REDUCTION: This exam was performed according to the departmental dose-optimization program which includes automated exposure control, adjustment of the mA and/or kV according to patient size and/or use of iterative reconstruction technique. COMPARISON:  Prior CT from 11/02/2019. FINDINGS: Brain: Age-related cerebral atrophy with chronic small vessel ischemic disease. Small parenchymal calcific density at the posterior left frontal periventricular white matter, stable. No acute intracranial hemorrhage. No visible acute large vessel  territory infarct. No mass lesion or midline shift. No hydrocephalus or extra-axial fluid collection. Vascular: No hyperdense vessel. Scattered vascular calcifications noted within the carotid siphons. Skull: Scalp soft tissues and calvarium within normal limits. Calvarium intact. Sinuses/Orbits: Globes and orbital soft tissues demonstrate no acute finding. Scattered mucosal thickening noted within the ethmoidal air cells. Paranasal sinuses are otherwise clear. Trace left mastoid effusion noted. Other: None. ASPECTS [facility] Stroke Program Early CT Score) - Ganglionic level infarction (caudate, lentiform nuclei, internal capsule, insula, M1-M3 cortex): 7 - Supraganglionic infarction (M4-M6 cortex): 3 Total score (0-10 with 10 being normal): 10 IMPRESSION: 1. No acute intracranial abnormality. 2. ASPECTS is 10. 3. 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 These results were communicated to the nurse practitioner taking care of the patient, Sharion Settler at 3:32 am on 06/20/2021 by text page via the [facility] messaging system. Electronically Signed   By: Jeannine Boga M.D.   On: 06/20/2021 03:57      #81 year old female patient with a history of chronic relapsing TTP-admitted to the hospital for acute pyelonephritis/with new onset of focal neurologic deficits  #Chronic relapsed refractory TTP-question acute flare-causing stroke. Not typical presentation-as schistocytes negative on review of smear, normal hemoglobin.  Discussed with Dr. Reuel Derby with pathology.  However, acute stroke is a concern/in the context of unexplained severe thrombocytopenia [30--->16]  #Acute pyonephritis s/p recent urologic procedure-on IV antibiotics  #Acute stroke-s/p evaluation with neurology-not on antiplatelet therapy.  See above  Recommendations: #Given the atypical presentation, but given significant findings concerning for TTP relapse [elevated LDH; thrombocytopenia; acute stroke]-recommend the patient gets emergent therapeutic plasma exchange.  Given the absence of such an option at [facility]; patient may need to be transferred to [facility] for definitive therapy.  While awaiting transfer-recommend starting Solu-Medrol 125 mg every 8 hours.  Start cablivi [patient's daughter initially reluctant given prior history of anemia; however given current limited options agrees with proceeding with injection now].  Also recommend 2 units of plasma infusion.   #Check von Willebrand levels.  Thank you Dr. Reesa Chew for allowing me to participate in the care of your pleasant patient. Please do not hesitate to contact me with questions or concerns in the interim.  Discussed with Dr. Reesa Chew.  Also updated the Dr.Ennver accepting oncologist at [facility].  Discussed with pharmacy; and the nursing staff in the ICU.  I greatly appreciate the efforts of Dr. Reesa Chew and has been coordinating the complicated patient's care with the team at [facility].  All questions were answered. The patient knows to call the clinic with any problems, questions or concerns.    Cammie Sickle, MD 06/20/2021 2:47 PM

## 2021-06-20 NOTE — Progress Notes (Addendum)
NP Sharion Settler) discussed with patient's family regarding patient plan of care to transfer to step down for now related to critical platelets and eventually work on transfer to [facility] as soon as possible. Per NP, Dr. Migdalia Dk (her oncologist) is on the case. Patient's daughter wants her to transfer to [facility] cone As soon as possible.

## 2021-06-20 NOTE — Assessment & Plan Note (Addendum)
Patient developed left-sided weakness overnight which appears to be resolving this morning.  CTA of the neck was unremarkable for any large vessel occlusion. MRI- acute small multifocal infarct She will avoid using antiplatelets A1c, lipid panel Echocardiogram-pending PT/OT/speech Suspicion of this is related to TTP flare?

## 2021-06-20 NOTE — Assessment & Plan Note (Signed)
-

## 2021-06-20 NOTE — Consult Note (Signed)
Referral MD  Reason for Referral: Probable relapse of TTP  No chief complaint on file. : My TTP has come back.  HPI: [name] is a very charming 81 year old white female.  She is very familiar with our practice.  She sees Dr. Malachy Mood.  She has a long history of relapsing TTP.  She I think her last relapse in March 2022.  She gets 4 E.  She was on Imuran at home.  She recently had a lithotripsy.  She had a stent placed in the right ureter to 2 days ago.  Unfortunately, she developed a lot of coughing.  This dislodged the stent.  It was felt that she may have had eye infection.  A CT scan was done which showed some slight right-sided hydronephrosis.  There was some pyelonephritis with perinephric stranding.  She is started on Rocephin.  Unfortunate, her platelet count has been dropping.  She had neurological deficits last night.  She had MRI of the brain.  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  Her labs which were done at [place] earlier this morning showed a BUN of 35 creatinine 0.74.  LDH was 386.  Her white cell count is 11.  Hemoglobin 14.7.  Platelet count 16,000.  Again, there was concern about relapse of the TTP given the high LDH, lower platelet count, and neurological status.  She has subsequently moved over to [facility] for initiation of plasma exchange.  When I saw this morning, she was having some slight difficulties with speech.  I think she may have had some expressive aphasia.  She would say some and then just stop.  She had little bit of weakness over on the right side.  There is some slight bruising on the legs.  Her husband and daughter were with her this afternoon.  Overall, I would have to say that her performance status is probably ECOG 2.    Past Medical History:  Diagnosis Date   Abnormal laboratory test 09/10/2013   Persistent elevation LDH   Acute delirium    Adnexal cyst 09/25/2020   Anemia    Arthritis     Bacteremia 03/2011   Blood dyscrasia    Blood transfusion    Bronchitis, chronic obstructive w acute bronchitis (HCC) 08/12/2013   Chronic back pain    COPD (chronic obstructive pulmonary disease) (HCC)    Depression    "mild"   Endocarditis    Epidural abscess    Heart murmur    History of plasmapheresis 08/08/2013   Active for 3rd relapse of TTP   History of TTP (thrombotic thrombocytopenic purpura)    Hypokalemia 08/08/2013   Steroid related   Normal echocardiogram 03/15/2011   Oral thrush 08/08/2013   Septicemia 03/2011   Spinal stenosis, lumbar    Tobacco abuse 05/10/2013  :   Past Surgical History:  Procedure Laterality Date   BACK SURGERY     BLADDER INSTILLATION N/A 06/18/2021   Procedure: BLADDER INSTILLATION OF GEMCITABINE;  Surgeon: Billey Co, MD;  Location: [facility] ORS;  Service: Urology;  Laterality: N/A;   CATARACT EXTRACTION W/ INTRAOCULAR LENS  IMPLANT, BILATERAL  ~ 2010   CESAREAN SECTION  10/09/1976   CYSTOSCOPY/URETEROSCOPY/HOLMIUM LASER/STENT PLACEMENT Right 06/18/2021   Procedure: CYSTOSCOPY/URETEROSCOPY/HOLMIUM LASER/STENT PLACEMENT;  Surgeon: Billey Co, MD;  Location: [facility] ORS;  Service: Urology;  Laterality: Right;   ESOPHAGOGASTRODUODENOSCOPY (EGD) WITH PROPOFOL N/A 09/27/2020   Procedure: ESOPHAGOGASTRODUODENOSCOPY (EGD) WITH PROPOFOL;  Surgeon: Ronald Lobo, MD;  Location: [facility] ENDOSCOPY;  Service: Endoscopy;  Laterality: N/A;   EYE SURGERY     INSERTION OF DIALYSIS CATHETER Left 07/12/2013   Procedure: INSERTION OF DIALYSIS CATHETER   with Ultrasound;  Surgeon: Rosetta Posner, MD;  Location: [place];  Service: Vascular;  Laterality: Left;   IR PERC TUN PERIT CATH WO PORT S&I /IMAG  07/16/2020   IR RADIOLOGIST EVAL & MGMT  09/26/2020   IR US GUIDE VASC ACCESS LEFT  07/16/2020   LUMBAR LAMINECTOMY/DECOMPRESSION MICRODISCECTOMY  03/16/2011   Procedure: LUMBAR LAMINECTOMY/DECOMPRESSION MICRODISCECTOMY;  Surgeon: Elaina Hoops;  Location: [place] [facility]  [facility];  Service: Neurosurgery;  Laterality: N/A;   TEE WITHOUT CARDIOVERSION  03/21/2011   Procedure: TRANSESOPHAGEAL ECHOCARDIOGRAM (TEE);  Surgeon: Laverda Page;  Location: [facility] ENDOSCOPY;  Service: Cardiovascular;  Laterality: N/A;  TEE for vegetations   TONSILLECTOMY     TRANSURETHRAL RESECTION OF BLADDER TUMOR N/A 06/18/2021   Procedure: TRANSURETHRAL RESECTION OF BLADDER TUMOR (TURBT);  Surgeon: Billey Co, MD;  Location: [facility] ORS;  Service: Urology;  Laterality: N/A;   TUBAL LIGATION    :   Current Facility-Administered Medications:    0.9 %  sodium chloride infusion (Manually program via Guardrails IV Fluids), , Intravenous, Once, Cherene Altes, MD   acetaminophen (TYLENOL) tablet 650 mg, 650 mg, Oral, Q4H PRN, Marin Olp, Rudell Cobb, MD   anticoagulant sodium citrate solution 5 mL, 5 mL, Intracatheter, Once, Glenice Ciccone, Rudell Cobb, MD   azaTHIOprine (IMURAN) tablet 100 mg, 100 mg, Oral, Daily, Thereasa Solo, Kimberlee Nearing, MD   calcium carbonate (TUMS - dosed in mg elemental calcium) chewable tablet 400 mg of elemental calcium, 2 tablet, Oral, Q3H, Santhiago Collingsworth, Rudell Cobb, MD   calcium gluconate 2 g/ 100 mL sodium chloride IVPB, 2 g, Intravenous, Once, Lexxi Koslow, Rudell Cobb, MD   cefTRIAXone (ROCEPHIN) 1 g in sodium chloride 0.9 % 100 mL IVPB, 1 g, Intravenous, Q24H, McClung, Kimberlee Nearing, MD   cholecalciferol (VITAMIN D3) tablet 400 Units, 400 Units, Oral, Daily, Joette Catching T, MD   citrate dextrose (ACD-A anticoagulant) solution 1,000 mL, 1,000 mL, Other, Continuous, Karson Chicas, Rudell Cobb, MD   diphenhydrAMINE (BENADRYL) capsule 25 mg, 25 mg, Oral, Q6H PRN, Volanda Napoleon, MD   FLUoxetine (PROZAC) capsule 20 mg, 20 mg, Oral, Daily, McClung, Kimberlee Nearing, MD   folic acid (FOLVITE) tablet 1 mg, 1 mg, Oral, Daily, McClung, Kimberlee Nearing, MD   gabapentin (NEURONTIN) tablet 600 mg, 600 mg, Oral, TID PRN, Cherene Altes, MD   HYDROcodone-acetaminophen (NORCO/VICODIN) 5-325 MG per tablet 1-2 tablet, 1-2 tablet,  Oral, Q6H PRN, Cherene Altes, MD   latanoprost (XALATAN) 0.005 % ophthalmic solution 1 drop, 1 drop, Both Eyes, QHS, McClung, Kimberlee Nearing, MD   methylPREDNISolone sodium succinate (SOLU-MEDROL) 125 mg/2 mL injection 125 mg, 125 mg, Intravenous, Q8H, McClung, Kimberlee Nearing, MD   multivitamin tablet 1 tablet, 1 tablet, Oral, Daily, McClung, Kimberlee Nearing, MD   polyvinyl alcohol (LIQUIFILM TEARS) 1.4 % ophthalmic solution 1 drop, 1 drop, Both Eyes, PRN, Cherene Altes, MD   rosuvastatin (CRESTOR) tablet 20 mg, 20 mg, Oral, QHS, McClung, Kimberlee Nearing, MD   tiotropium (SPIRIVA) inhalation capsule ([facility] use ONLY) 18 mcg, 18 mcg, Inhalation, Daily, McClung, Kimberlee Nearing, MD   vitamin B-12 (CYANOCOBALAMIN) tablet 1,000 mcg, 1,000 mcg, Oral, Daily, Cherene Altes, MD:   sodium chloride   Intravenous Once   azaTHIOprine  100 mg Oral Daily   calcium carbonate  2 tablet Oral Q3H   cholecalciferol  400 Units Oral Daily   FLUoxetine  20 mg Oral Daily   folic acid  1 mg Oral Daily   latanoprost  1 drop Both Eyes QHS   methylPREDNISolone sodium succinate  125 mg Intravenous Q8H   multivitamin  1 tablet Oral Daily   rosuvastatin  20 mg Oral QHS   tiotropium  18 mcg Inhalation Daily   cyanocobalamin  1,000 mcg Oral Daily  :   Allergies  Allergen Reactions   Adhesive [Tape] Other (See Comments)    Blisters, Band-Aids brand rips off the skin- paper tape only!!   Cefuroxime Axetil Anxiety and Other (See Comments)    Made the patient jittery   Cefuroxime Axetil Anxiety and Other (See Comments)    Shaky, amoxicillin ok Made the patient jittery   Pedi-Pre Tape Spray [Wound Dressing Adhesive] Other (See Comments)    Blisters, band-aide brand  Blisters, Band-Aids brand rips off the skin- paper tape only!!   Other Hives and Other (See Comments)    Wool: Reaction is hives Statistician tape: Reaction is blistering    Sulfa Antibiotics Hives   Sulfa Drugs Cross Reactors Hives  :   Family  History  Problem Relation Age of Onset   Heart disease Mother    Leukemia Sister    Breast cancer Neg Hx    Colon cancer Neg Hx    Ovarian cancer Neg Hx    Uterine cancer Neg Hx    Cervical cancer Neg Hx   :   Social History   Socioeconomic History   Marital status: Married    Spouse name: Not on file   Number of children: Not on file   Years of education: Not on file   Highest education level: Not on file  Occupational History   Not on file  Tobacco Use   Smoking status: Every Day    Packs/day: 3.00    Years: 55.00    Pack years: 165.00    Types: Cigarettes    Passive exposure: Never   Smokeless tobacco: Never   Tobacco comments:    Vapors  Vaping Use   Vaping Use: Every day  Substance and Sexual Activity   Alcohol use: Yes    Alcohol/week: 7.0 standard drinks    Types: 7 Cans of beer per week    Comment: Beer a night.   Drug use: No   Sexual activity: Not Currently    Partners: Male  Other Topics Concern   Not on file  Social History Narrative   Not on file   Social Determinants of Health   Financial Resource Strain: Not on file  Food Insecurity: Not on file  Transportation Needs: Not on file  Physical Activity: Not on file  Stress: Not on file  Social Connections: Not on file  Intimate Partner Violence: Not on file  :  Review of Systems  Constitutional:  Positive for malaise/fatigue.  HENT: Negative.    Eyes: Negative.   Respiratory: Negative.    Cardiovascular: Negative.   Gastrointestinal:  Positive for vomiting.  Genitourinary:  Positive for frequency and urgency.  Musculoskeletal: Negative.   Skin: Negative.   Neurological:  Positive for speech change and headaches.  Endo/Heme/Allergies:  Bruises/bleeds easily.  Psychiatric/Behavioral: Negative.      Exam: Patient Vitals for the past 24 hrs:  BP Temp Temp src Pulse Resp SpO2 Height  06/20/21 1502 116/61 98.8 F (37.1 C) Oral 82 20 93 % 5\' 4"  (1.626 m)   Physical  Exam Vitals  reviewed.  HENT:     Head: Normocephalic and atraumatic.  Eyes:     Pupils: Pupils are equal, round, and reactive to light.  Cardiovascular:     Rate and Rhythm: Normal rate and regular rhythm.     Heart sounds: Normal heart sounds.  Pulmonary:     Effort: Pulmonary effort is normal.     Breath sounds: Normal breath sounds.  Abdominal:     General: Bowel sounds are normal.     Palpations: Abdomen is soft.  Musculoskeletal:        General: No tenderness or deformity. Normal range of motion.     Cervical back: Normal range of motion.  Lymphadenopathy:     Cervical: No cervical adenopathy.  Skin:    General: Skin is warm and dry.     Findings: No erythema or rash.     Comments: Skin exam shows some small ecchymoses in her legs.  Neurological:     Mental Status: She is alert and oriented to person, place, and time.     Comments: Neurological exam shows some word finding deficiencies.  She has a little weakness over on the right side.  Her affect may be a little flat.  Psychiatric:        Behavior: Behavior normal.        Thought Content: Thought content normal.        Judgment: Judgment normal.      Recent Labs    06/20/21 0429 06/20/21 1056  WBC 11.9* 11.0*  HGB 14.8 14.7  HCT 45.5 45.1  PLT 27* 16*    Recent Labs    06/19/21 0814 06/20/21 0608  NA 138 138  K 4.1 4.4  CL 100 102  CO2 28 26  GLUCOSE 149* 101*  BUN 33* 35*  CREATININE 1.60* 0.74  CALCIUM 9.4 9.0    Blood smear review: Pending  Pathology: None    Assessment and Plan: [name] is a very charming 81 year old white female.  She has history of relapsing TTP.  Again I think we have to make the assumption that this is a another relapse.  Platelet count is dropping quickly.  Her cultures are negative.  I would have thought that if her cultures were positive for gram-negative rods, then we might be looking at a consumptive process from infection.  She certainly does not look septic.  She has always  had a very low ADAMTS-13 level.  I am unsure if this really can help [place] out.  Her LDH has been elevated.  I think the neurological issues certainly go along with TTP.  Again I think that it is reasonable to start her on plasma exchange.  We will do a 1 volume exchange.  I will keep her on some steroids right now.  We will have to see how she responds.  I do appreciate everybody's incredible effort to get her to have plasma exchange today.  I realize that this can be incredibly challenging on the weekend.  I appreciate everybody going the extra mile for our patients so they can get the best care possible.  I am sure we will follow along incredibly closely.  She may need to have caplacizumab while she is hospitalized.  I know that she will get fantastic care from the wonderful and compassionate staff up on 4 E.  Lattie Haw, MD  Philippians 4:13

## 2021-06-20 NOTE — Progress Notes (Signed)
Pt arrived back to 4E from IR. VSS.  Raelyn Number, RN

## 2021-06-21 LAB — BPAM FFP
Blood Product Expiration Date: 202302242359
Blood Product Expiration Date: 202302242359
ISSUE DATE / TIME: 202302191217
ISSUE DATE / TIME: 202302191217
Unit Type and Rh: 5100
Unit Type and Rh: 6200

## 2021-06-21 LAB — SAVE SMEAR(SSMR), FOR PROVIDER SLIDE REVIEW

## 2021-06-21 LAB — THERAPEUTIC PLASMA EXCHANGE (BLOOD BANK)
Plasma Exchange: 3000
Plasma volume needed: 3000
Unit division: 0
Unit division: 0
Unit division: 0
Unit division: 0
Unit division: 0
Unit division: 0
Unit division: 0

## 2021-06-21 LAB — CBC WITH DIFFERENTIAL/PLATELET
Abs Immature Granulocytes: 0.04 10*3/uL (ref 0.00–0.07)
Basophils Absolute: 0 10*3/uL (ref 0.0–0.1)
Basophils Relative: 0 %
Eosinophils Absolute: 0 10*3/uL (ref 0.0–0.5)
Eosinophils Relative: 0 %
HCT: 41.2 % (ref 36.0–46.0)
Hemoglobin: 14 g/dL (ref 12.0–15.0)
Immature Granulocytes: 1 %
Lymphocytes Relative: 5 %
Lymphs Abs: 0.4 10*3/uL — ABNORMAL LOW (ref 0.7–4.0)
MCH: 32.6 pg (ref 26.0–34.0)
MCHC: 34 g/dL (ref 30.0–36.0)
MCV: 96 fL (ref 80.0–100.0)
Monocytes Absolute: 0.1 10*3/uL (ref 0.1–1.0)
Monocytes Relative: 1 %
Neutro Abs: 7.6 10*3/uL (ref 1.7–7.7)
Neutrophils Relative %: 93 %
Platelets: 18 10*3/uL — CL (ref 150–400)
RBC: 4.29 MIL/uL (ref 3.87–5.11)
RDW: 12.9 % (ref 11.5–15.5)
WBC: 8.1 10*3/uL (ref 4.0–10.5)
nRBC: 0 % (ref 0.0–0.2)

## 2021-06-21 LAB — LACTATE DEHYDROGENASE: LDH: 267 U/L — ABNORMAL HIGH (ref 98–192)

## 2021-06-21 LAB — COMPREHENSIVE METABOLIC PANEL
ALT: 21 U/L (ref 0–44)
AST: 36 U/L (ref 15–41)
Albumin: 3.4 g/dL — ABNORMAL LOW (ref 3.5–5.0)
Alkaline Phosphatase: 38 U/L (ref 38–126)
Anion gap: 11 (ref 5–15)
BUN: 39 mg/dL — ABNORMAL HIGH (ref 8–23)
CO2: 28 mmol/L (ref 22–32)
Calcium: 9.6 mg/dL (ref 8.9–10.3)
Chloride: 99 mmol/L (ref 98–111)
Creatinine, Ser: 2.21 mg/dL — ABNORMAL HIGH (ref 0.44–1.00)
GFR, Estimated: 22 mL/min — ABNORMAL LOW (ref >60)
Glucose, Bld: 144 mg/dL — ABNORMAL HIGH (ref 70–99)
Potassium: 3.9 mmol/L (ref 3.5–5.1)
Sodium: 138 mmol/L (ref 135–145)
Total Bilirubin: 0.9 mg/dL (ref 0.3–1.2)
Total Protein: 5.9 g/dL — ABNORMAL LOW (ref 6.5–8.1)

## 2021-06-21 LAB — SURGICAL PATHOLOGY

## 2021-06-21 LAB — PREPARE FRESH FROZEN PLASMA: Unit division: 0

## 2021-06-21 LAB — PHOSPHORUS: Phosphorus: 4.4 mg/dL (ref 2.5–4.6)

## 2021-06-21 LAB — MAGNESIUM: Magnesium: 1.8 mg/dL (ref 1.7–2.4)

## 2021-06-21 LAB — RETICULOCYTES
Immature Retic Fract: 4.9 % (ref 2.3–15.9)
RBC.: 4.32 MIL/uL (ref 3.87–5.11)
Retic Count, Absolute: 59.6 10*3/uL (ref 19.0–186.0)
Retic Ct Pct: 1.4 % (ref 0.4–3.1)

## 2021-06-21 MED ORDER — ACD FORMULA A 0.73-2.45-2.2 GM/100ML VI SOLN
1000.0000 mL | Status: DC
  Administered 2021-06-21: 1000 mL

## 2021-06-21 MED ORDER — GABAPENTIN 300 MG PO CAPS
300.0000 mg | ORAL_CAPSULE | Freq: Three times a day (TID) | ORAL | Status: DC | PRN
Start: 2021-06-21 — End: 2021-06-26
  Administered 2021-06-21 – 2021-06-25 (×6): 300 mg via ORAL
  Filled 2021-06-21 (×6): qty 1

## 2021-06-21 MED ORDER — CALCIUM CARBONATE ANTACID 500 MG PO CHEW
2.0000 | CHEWABLE_TABLET | ORAL | Status: AC
  Administered 2021-06-21 (×2): 400 mg via ORAL
  Filled 2021-06-21 (×2): qty 2

## 2021-06-21 MED ORDER — CALCIUM GLUCONATE-NACL 2-0.675 GM/100ML-% IV SOLN
2.0000 g | INTRAVENOUS | Status: AC
  Administered 2021-06-21: 2000 mg via INTRAVENOUS
  Filled 2021-06-21: qty 100

## 2021-06-21 MED ORDER — ANTICOAGULANT SODIUM CITRATE 4% (200MG/5ML) IV SOLN
5.0000 mL | Status: AC
  Administered 2021-06-21: 5 mL
  Filled 2021-06-21: qty 5

## 2021-06-21 MED ORDER — DIPHENHYDRAMINE HCL 25 MG PO CAPS
25.0000 mg | ORAL_CAPSULE | Freq: Four times a day (QID) | ORAL | Status: DC | PRN
  Administered 2021-06-21: 25 mg via ORAL
  Filled 2021-06-21: qty 1

## 2021-06-21 MED ORDER — ACETAMINOPHEN 325 MG PO TABS
650.0000 mg | ORAL_TABLET | ORAL | Status: DC | PRN
  Administered 2021-06-21: 650 mg via ORAL
  Filled 2021-06-21: qty 2

## 2021-06-21 MED ORDER — CHLORHEXIDINE GLUCONATE CLOTH 2 % EX PADS
6.0000 | MEDICATED_PAD | Freq: Every day | CUTANEOUS | Status: DC
  Administered 2021-06-21 – 2021-06-26 (×5): 6 via TOPICAL

## 2021-06-21 MED ORDER — GABAPENTIN 600 MG PO TABS
600.0000 mg | ORAL_TABLET | Freq: Three times a day (TID) | ORAL | Status: DC
Start: 2021-06-21 — End: 2021-06-21

## 2021-06-21 NOTE — Progress Notes (Signed)
[name]  IDP:[id number] DOB: 11-06-1940 DOA: 06/20/2021 PCP: Kirk Ruths, MD    Brief Narrative:  81yo female with history of DVT/PE, TTP, COPD, tobacco use, and depression who recently underwent cystoscopy with lithotripsy requiring stent placement.  Stent became dislodged during a coughing spell a day prior to admission, leading to right-sided flank pain.  She presented to the [facility] ED where she was found to have pyelonephritis and started on IV Rocephin.   Overnight 2/18 > 2/19 while at [facility] the patient developed left-sided weakness and code stroke was called.  Her symptoms soon after started improving, though not fully resolving. CT head/CTa head did not note any acute findings or large vessel occlusions. MRI was consistent with small multiple infarcts in B hemispheres. Her platelets trended down abruptly from 89K > 24K > 16K. It was suspected that she was in a TTP flare therefore arrangements were made to transfer her to [facility] for plasmapheresis.  Consultants:  Hematology IR  Code Status: FULL CODE  Antimicrobials:  Rocephin  DVT prophylaxis: SCDs  Interim Hx: Afebrile.  Vital signs stable.  Saturation 92% on 3 L.  Renal function stable.  Platelet count has improved ever so slightly from 16-18.  LDH has improved from 386 to 267.  The patient is seen in the hemodialysis unit.  She is tolerating her plasmapheresis without difficulty.  Her aphasia persist but is less pronounced than when I first met her yesterday.  She denies shortness of breath or chest pain.  She reports ongoing mild left upper extremity weakness.  Assessment & Plan:  TTP w/ acute exacerbation Continue plasmapheresis under direction of Hematology   Acute left-sided weakness CTA neck unremarkable for any large vessel occlusion - MRI noted multiple B acute small multifocal infarcts - felt to be due to TTP - avoid ASA/Plavix due to TTP - PT/OT/SLP   Acute complicated  pyelonephritis underwent recent ureteroscopy, laser lithotripsy and stent placement - stent dislodged day prior to admit - CT revealed right-sided hydronephrosis with perinephric stranding - was seen by Urology at [facility] who recommended abx tx only   Acute kidney failure on CKD stage IIIa Baseline creatinine dating to January of this year approximately 1.3 with GFR 40-48 -acute worsening felt to be combination of pyelonephritis, stent removal, and acute critical illness with TTP -monitor trend but   Nephrolithiasis Status post recent ureteroscopy as noted above    Nicotine dependence nicotine patch   COPD  Well compensated at this time    Family Communication: No family present at time of exam Disposition: From home -disposition not clear at present but may require therapy  Objective: Blood pressure (!) 148/68, pulse 81, temperature 98.6 F (37 C), temperature source Oral, resp. rate 18, height 5' 4" (1.626 m), weight 80 kg, SpO2 92 %. No intake or output data in the 24 hours ending 06/21/21 1032 Filed Weights   06/20/21 1833 06/21/21 0500 06/21/21 0920  Weight: 80 kg 80.7 kg 80 kg    Examination: General: No acute respiratory distress Lungs: Clear to auscultation bilaterally without wheezes or crackles Cardiovascular: Regular rate and rhythm without murmur gallop or rub normal S1 and S2 Abdomen: Nontender, nondistended, soft, bowel sounds positive, no rebound, no ascites, no appreciable mass Extremities: No significant cyanosis, clubbing, or edema bilateral lower extremities  CBC: Recent Labs  Lab 06/19/21 0814 06/20/21 0429 06/20/21 1056 06/21/21 0402  WBC 14.7* 11.9* 11.0* 8.1  NEUTROABS 12.5*  --  8.4* 7.6  HGB 15.9* 14.8 14.7 14.0  HCT 48.2* 45.5 45.1 41.2  MCV 98.0 98.9 99.6 96.0  PLT 89* 27* 16* 18*   Basic Metabolic Panel: Recent Labs  Lab 06/19/21 0814 06/20/21 0608 06/20/21 1732 06/21/21 0402  NA 138 138 135 138  K 4.1 4.4 4.1 3.9  CL 100 102 100 99   CO2 _0 GLUCOSE 149* 101* 143* 144*  BUN 33* 35* 32* 39*  CREATININE 1.60* 0.74 2.20* 2.21*  CALCIUM 9.4 9.0 9.0 9.6  MG 2.0 2.0  --  1.8  PHOS  --  2.2*  --  4.4   GFR: Estimated Creatinine Clearance: 21.1 mL/min (A) (by C-G formula based on SCr of 2.21 mg/dL (H)).  Liver Function Tests: Recent Labs  Lab 06/19/21 0814 06/20/21 0608 06/20/21 0654 06/21/21 0402  AST 29 33  --  36  ALT 18 18  --  21  ALKPHOS 38 32*  --  38  BILITOT 1.1 1.2 1.0 0.9  PROT 7.3 6.4*  --  5.9*  ALBUMIN 4.3 3.7  --  3.4*    HbA1C: Hemoglobin A1C  Date/Time Value Ref Range Status  09/14/2012 05:44 AM 5.4 4.2 - 6.3 % Final    Comment:    The American Diabetes Association recommends that a primary goal of therapy should be <7% and that physicians should reevaluate the treatment regimen in patients with HbA1c values consistently >8%.    Hgb A1c MFr Bld  Date/Time Value Ref Range Status  06/20/2021 06:08 AM 5.3 4.8 - 5.6 % Final    Comment:    (NOTE) Pre diabetes:          5.7%-6.4%  Diabetes:              >6.4%  Glycemic control for   <7.0% adults with diabetes   08/20/2013 01:35 PM 5.8 (H) <5.7 % Final    Comment:    (NOTE)                                                                       According to the ADA Clinical Practice Recommendations for 2011, when HbA1c is used as a screening test:  >=6.5%   Diagnostic of Diabetes Mellitus           (if abnormal result is confirmed) 5.7-6.4%   Increased risk of developing Diabetes Mellitus References:Diagnosis and Classification of Diabetes Mellitus,Diabetes PIRJ,1884,16(SAYTK 1):S62-S69 and Standards of Medical Care in         Diabetes - 2011,Diabetes ZSWF,0932,35 (Suppl 1):S11-S61.     Recent Results (from the past 240 hour(s))  Urine culture     Status: Abnormal   Collection Time: 06/11/21  3:58 PM   Specimen: Urine, Random  Result Value Ref Range Status   Specimen Description   Final    URINE, RANDOM Performed at  [facility], [address], [place], [place] [address]    Special Requests   Final    NONE Performed at [facility], [address], [place], [place] [address]    Culture MULTIPLE SPECIES PRESENT, SUGGEST RECOLLECTION (A)  Final   Report Status 06/13/2021 FINAL  Final  Urine Culture     Status: None  Collection Time: 06/19/21  8:14 AM   Specimen: Urine, Clean Catch  Result Value Ref Range Status   Specimen Description   Final    URINE, CLEAN CATCH Performed at [facility], [address], [place], [place] [address]    Special Requests   Final    NONE Performed at [facility], [address], [place], [place] [address]    Culture   Final    NO GROWTH Performed at [facility], [place], [place], [place] [address]    Report Status 06/20/2021 FINAL  Final  Blood culture (routine x 2)     Status: None (Preliminary result)   Collection Time: 06/19/21 10:23 AM   Specimen: BLOOD  Result Value Ref Range Status   Specimen Description BLOOD BLOOD RIGHT WRIST  Final   Special Requests   Final    BOTTLES DRAWN AEROBIC AND ANAEROBIC Blood Culture results may not be optimal due to an inadequate volume of blood received in culture bottles   Culture   Final    NO GROWTH 2 DAYS Performed at [facility], [address], [place], [place] [address]    Report Status PENDING  Incomplete  Resp Panel by RT-PCR (Flu A&B, Covid) Nasopharyngeal Swab     Status: None   Collection Time: 06/19/21 10:23 AM   Specimen: Nasopharyngeal Swab; Nasopharyngeal(NP) swabs in vial transport medium  Result Value Ref Range Status   SARS Coronavirus 2 by RT PCR NEGATIVE NEGATIVE Final    Comment: (NOTE) SARS-CoV-2 target nucleic acids are NOT DETECTED.  The SARS-CoV-2 RNA is generally detectable in upper respiratory specimens during the acute phase of infection. The lowest concentration of SARS-CoV-2 viral copies this assay can detect  is 138 copies/mL. A negative result does not preclude SARS-Cov-2 infection and should not be used as the sole basis for treatment or other patient management decisions. A negative result may occur with  improper specimen collection/handling, submission of specimen other than nasopharyngeal swab, presence of viral mutation(s) within the areas targeted by this assay, and inadequate number of viral copies(<138 copies/mL). A negative result must be combined with clinical observations, patient history, and epidemiological information. The expected result is Negative.  Fact Sheet for Patients:  EntrepreneurPulse.com.au  Fact Sheet for Healthcare Providers:  IncredibleEmployment.be  This test is no t yet approved or cleared by the [place] FDA and  has been authorized for detection and/or diagnosis of SARS-CoV-2 by FDA under an Emergency Use Authorization (EUA). This EUA will remain  in effect (meaning this test can be used) for the duration of the COVID-19 declaration under Section 564(b)(1) of the Act, 21 U.S.C.section 360bbb-3(b)(1), unless the authorization is terminated  or revoked sooner.       Influenza A by PCR NEGATIVE NEGATIVE Final   Influenza B by PCR NEGATIVE NEGATIVE Final    Comment: (NOTE) The Xpert Xpress SARS-CoV-2/FLU/RSV plus assay is intended as an aid in the diagnosis of influenza from Nasopharyngeal swab specimens and should not be used as a sole basis for treatment. Nasal washings and aspirates are unacceptable for Xpert Xpress SARS-CoV-2/FLU/RSV testing.  Fact Sheet for Patients: EntrepreneurPulse.com.au  Fact Sheet for Healthcare Providers: IncredibleEmployment.be  This test is not yet approved or cleared by the [place] FDA and has been authorized for detection and/or diagnosis of SARS-CoV-2 by FDA under an Emergency Use Authorization (EUA). This EUA will remain in effect  (meaning this test can be used) for the duration of the  COVID-19 declaration under Section 564(b)(1) of the Act, 21 U.S.C. section 360bbb-3(b)(1), unless the authorization is terminated or revoked.  Performed at [facility], [address] [place], [place] [address]   Blood culture (routine x 2)     Status: None (Preliminary result)   Collection Time: 06/19/21  2:27 PM   Specimen: BLOOD  Result Value Ref Range Status   Specimen Description BLOOD RIGHT ANTECUBITAL  Final   Special Requests   Final    BOTTLES DRAWN AEROBIC AND ANAEROBIC Blood Culture adequate volume   Culture   Final    NO GROWTH 2 DAYS Performed at [facility], [address], [place], [place] [address]    Report Status PENDING  Incomplete  MRSA Next Gen by PCR, Nasal     Status: None   Collection Time: 06/20/21 12:00 PM   Specimen: Nasal Mucosa; Nasal Swab  Result Value Ref Range Status   MRSA by PCR Next Gen NOT DETECTED NOT DETECTED Final    Comment: (NOTE) The GeneXpert MRSA Assay (FDA approved for NASAL specimens only), is one component of a comprehensive MRSA colonization surveillance program. It is not intended to diagnose MRSA infection nor to guide or monitor treatment for MRSA infections. Test performance is not FDA approved in patients less than 49 years old. Performed at [facility], [address] [place], [place] [address]      Scheduled Meds:  sodium chloride   Intravenous Once   azaTHIOprine  100 mg Oral Daily   Chlorhexidine Gluconate Cloth  6 each Topical Daily   cholecalciferol  400 Units Oral Daily   FLUoxetine  20 mg Oral Daily   folic acid  1 mg Oral Daily   latanoprost  1 drop Both Eyes QHS   methylPREDNISolone sodium succinate  125 mg Intravenous Q8H   multivitamin with minerals  1 tablet Oral Daily   nicotine  21 mg Transdermal Daily   pantoprazole  40 mg Oral Q1200   QUEtiapine  25 mg Oral QHS   rosuvastatin  20 mg Oral QHS   tiotropium  18  mcg Inhalation Daily   cyanocobalamin  1,000 mcg Oral Daily   Continuous Infusions:  anticoagulant sodium citrate     calcium gluconate 2,000 mg (06/21/21 0939)   cefTRIAXone (ROCEPHIN) IVPB 1 gram/100 mL NS (Mini-Bag Plus) 1 g (06/21/21 0601)   citrate dextrose       LOS: 1 day   Cherene Altes, MD [facility] Office  [phone] Pager - Text Page per Shea Evans  If 7PM-7AM, please contact night-coverage per Amion 06/21/2021, 10:32 AM

## 2021-06-21 NOTE — Progress Notes (Addendum)
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, steroids, rituximab Relapse of TTP March 2015-plasma exchange, steroids, rituximab, and maintenance azathioprine Admission with severe thrombocytopenia and elevated LDH 07/16/2020, ADAMTS13 activity less than 2%, consistent with relapse of TTP       -Steroids/FFP given 07/16/2020       -Daily plasma exchange beginning 07/17/2020, 7 days, then qod starting 3/24, discharge 07/21/2020 on prednisone 60 mg daily       -Prednisone taper to 40 mg daily 07/29/2020       -rituximab 07/30/2020, 08/06/2020, 08/13/2020, 08/20/2020       -plasmapheresis on 07/31/2020, 08/03/2020, 08/05/2020, and 08/07/2020       -prednisone increased to 60 mg daily 08/17/2020       -Plasmapheresis 08/24/2020, 08/25/2020, 08/26/2020, 08/27/2020, 08/28/2020, 08/29/2020, 08/31/2020, 09/02/2020, 09/04/2020       -caplacizumab initiated 08/24/2020       -Decrease prednisone to 40 mg daily 08/28/2020       -ADAMTS13 37% on 09/04/2020       -Prednisone decreased to 30 mg daily 09/07/2020       -Continue caplacizumab       -prednisone decreased to 20 mg daily beginning 09/18/2020       -Cablivi discontinued 09/21/2020       -prednisone discontinued 09/24/2020, resumed at a dose of 20 mg daily on 10/01/2020       -prednisone taper to 10 mg daily 10/23/2020       -Prednisone taper to 5 mg daily 11/06/2020       -prednisone continued at a dose of 5 mg daily, Imuran resumed at 50 mg daily 12/03/2020 History of epidural abscess requiring laminectomy 2012 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 Cristina Gong- no active bleeding or blood  in the stomach, nor any prospective bleeding site, seen on exam. Hospital admission 06/19/2021 to [facility] with subsequent transfer to [facility], CVA, acute complicated pyelonephritis  Ms. appears stable.  She continues to have some mild word finding difficulty and left-sided weakness.  These may be somewhat improved since admission.  She has some lab findings concerning for TTP.  ADAMTS 13 is pending.  However, her ADAMTS 13 activity level is consistently low.  Plan is to continue daily plasmapheresis.  Today is day 2 of her plasmapheresis.  She is also receiving Solu-Medrol 125 mg IV every 8 hours.  Recommend for her to continue on the current dose of Solu-Medrol.  She had a recent right ureteroscopy and cystoscopy as well as bladder biopsy with instillation of gemcitabine on 06/18/2021.  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  There are reports of TTP associated with gemcitabine administration.  Recommendations: 1.  Continue daily plasmapheresis. 2.  Follow-up on ADAMTS 13 result,, though her level has been chronically low 3.  Continue Solu-Medrol 4.  No plans for caplacizumab given history of GI bleed while on this medication. 5.  Consider urology/nephrology consults if the creatinine remains elevated, recent CT contrast and hydronephrosis on CT 6.  Daily CBC, chemistry panel, and LDH 7.  Hematology will continue following her in the hospital and outpatient follow-up will be scheduled at the Cancer center.  Mikey Bussing, DNP, AGPCNP-BC, AOCNP  Ms. Borchardt was interviewed and examined.  I reviewed the peripheral blood smear.  She is well-known to me with a history of relapsing TTP.  She last had a relapse of TTP in 2022 and has been maintained on prednisone and Imuran.  She has chronic mild thrombocytopenia, a mildly elevated LDH, and low ADAMTS13 level.  She is now admitted with clinical/radiologic evidence of strokes  following a cystoscopy procedure (including gemcitabine treatment) 06/18/2021.  The platelet count is following and the LDH is slightly more elevated than baseline.  I agree the clinical presentation is most likely related to progression of TTP, but her case is unusual on multiple fronts.  She does not have laboratory evidence of hemolysis and the drop in the platelet count coincide with the cystoscopy procedure.  I doubt gemcitabine contributed, but there have been cases of TTP associated with systemic gemcitabine.   She had GI bleeding while completing treatment with caplacizumab last year.  I recommend continuing daily plasma exchange and steroids.  We will consider another course of rituximab, though she did not clearly benefit from rituximab last year.  I was present for greater than 50% of today's visit.  I performed medical decision making.   SUBJECTIVE: Ms. Deason just returned from plasmapheresis just prior to my visit.  Today is day 2 of plasmapheresis.  Husband is at the bedside.  She states that she wants to go home.  Still having some expressive aphasia and left-sided weakness.  No bleeding reported.  PHYSICAL EXAMINATION:  Vitals:   06/21/21 1200 06/21/21 1244  BP: 139/62 (!) 142/63  Pulse: 86 81  Resp: (!) 21 18  Temp: 98.6 F (37 C) 98.5 F (36.9 C)  SpO2:  94%   Filed Weights   06/20/21 1833 06/21/21 0500 06/21/21 0920  Weight: 80 kg 80.7 kg 80 kg    Intake/Output from previous day: No intake/output data recorded.  Physical Exam Constitutional:      General: She is not in acute distress. HENT:     Head: Normocephalic.  Eyes:     General: No scleral icterus. Cardiovascular:     Rate and Rhythm: Normal rate.  Pulmonary:     Effort: Pulmonary effort is normal. No respiratory distress.  Abdominal:     Palpations: Abdomen is soft.     Tenderness: There is no abdominal tenderness.  Musculoskeletal:     Comments: Strength 5/5 on the right, 3/5 on the left   Skin:    Comments: Ecchymoses on her legs  Neurological:     Mental Status: She is alert.     Comments: She has some word finding difficulties    LABORATORY DATA:  I have reviewed the data as listed CMP Latest Ref Rng & Units 06/21/2021 06/20/2021 06/20/2021  Glucose 70 - 99 mg/dL 144(H) 143(H) -  BUN 8 - 23 mg/dL 39(H) 32(H) -  Creatinine 0.44 - 1.00 mg/dL 2.21(H) 2.20(H) -  Sodium 135 - 145 mmol/L 138 135 -  Potassium 3.5 - 5.1 mmol/L 3.9 4.1 -  Chloride 98 - 111 mmol/L 99 100 -  CO2 22 - 32 mmol/L 28 25 -  Calcium 8.9 - 10.3 mg/dL 9.6 9.0 -  Total Protein 6.5 - 8.1 g/dL 5.9(L) - -  Total Bilirubin 0.3 - 1.2 mg/dL 0.9 - 1.0  Alkaline Phos 38 - 126 U/L 38 - -  AST 15 - 41 U/L 36 - -  ALT 0 - 44 U/L 21 - -  Lab Results  Component Value Date   WBC 8.1 06/21/2021   HGB 14.0 06/21/2021   HCT 41.2 06/21/2021   MCV 96.0 06/21/2021   PLT 18 (LL) 06/21/2021   NEUTROABS 7.6 06/21/2021   I reviewed the peripheral blood smear.  The platelets are markedly decreased in number.  No platelet clumps..  Rare helmet and schistocytes.  No nucleated red cells.  The polychromasia is not increased.  The majority the white cells are mature neutrophils.  No blasts or other young forms are seen. No results found for: CEA1, CEA, [id number], CA125, PSA1  CT ABDOMEN PELVIS WO CONTRAST  Result Date: 06/19/2021 CLINICAL DATA:  Flank pain, kidney stone suspected EXAM: CT ABDOMEN AND PELVIS WITHOUT CONTRAST TECHNIQUE: Multidetector CT imaging of the abdomen and pelvis was performed following the standard protocol without IV contrast. RADIATION DOSE REDUCTION: This exam was performed according to the departmental dose-optimization program which includes automated exposure control, adjustment of the mA and/or kV according to patient size and/or use of iterative reconstruction technique. COMPARISON:  CT 05/31/2021 FINDINGS: Lower chest: Bibasilar atelectasis. There are 5 mm nodules in the peripheral lung bases  (series 3, image 4). Hepatobiliary: No focal liver abnormality is seen. Possible tiny hyperdense stones in the gallbladder fundus. Gallbladder is nondilated. Pancreas: Unremarkable. No pancreatic ductal dilatation or surrounding inflammatory changes. Spleen: Normal in size without focal abnormality. Adrenals/Urinary Tract: Adrenal glands are unremarkable. Punctate nonobstructive left lower pole renal stone. No left-sided hydronephrosis. Unchanged left lower pole cyst. Right-sided hydroureteronephrosis with perinephric and periureteral stranding. There is a small focus of gas within the right renal pelvis (series 2, image 30) likely related to recent retrograde pyelogram. Unchanged right renal cysts. There is no visible obstructing stone in the ureter. The previously seen 3 mm stone in the distal right ureter is no longer visible distant with recent ureteroscopy and lithotripsy. There is no stent in the ureter present. There is gas in the bladder consistent with recent procedure. Stomach/Bowel: Small hiatal hernia. The stomach is otherwise within normal limits. There is no evidence of bowel obstruction. The appendix is normal. There are few sigmoid diverticula. Vascular/Lymphatic: Aortoiliac atherosclerotic calcifications. No AAA. No lymphadenopathy. Reproductive: There is a 4.9 x 4.0 x 6.0 cm left adnexal structure with intermediate density, unchanged from prior exam. Other: No bowel containing hernia. No abdominopelvic ascites. No free air. Musculoskeletal: Unchanged chronic T12 compression deformity with 25% height loss anteriorly. Multilevel degenerative changes of the spine with severe degenerative disc disease and facet arthropathy in the lumbar spine. IMPRESSION: Right-sided hydroureteronephrosis with perinephric and perinephric inflammatory stranding. No obstructing stone is visible within the ureter. Small focus of gas within the right renal pelvis, likely related to recent retrograde pyelogram. There is no  stent in the ureter. Recommend urology consultation. Unchanged intermediate density left adnexal lesion measuring up to 6.0 cm, which could be a hemorrhagic cyst. Given the patient is postmenopausal, nonemergent pelvic ultrasound is recommended for further evaluation if not yet performed. Two incidental 5 mm pulmonary nodules in the peripheral lung bases. No follow-up needed if patient is low-risk (and has no known or suspected primary neoplasm). Non-contrast chest CT can be considered in 12 months if patient is high-risk. This recommendation follows the consensus statement: Guidelines for Management of Incidental Pulmonary Nodules Detected on CT Images: From the Fleischner Society 2017; Radiology 2017; 284:228-243. Electronically Signed   By: Maurine Simmering M.D.   On: 06/19/2021 09:45   CT ANGIO HEAD NECK W WO CM  Result Date:  06/20/2021 CLINICAL DATA:  81 year old female code stroke presentation. Left side weakness. EXAM: CT ANGIOGRAPHY HEAD AND NECK TECHNIQUE: Multidetector CT imaging of the head and neck was performed using the standard protocol during bolus administration of intravenous contrast. Multiplanar CT image reconstructions and MIPs were obtained to evaluate the vascular anatomy. Carotid stenosis measurements (when applicable) are obtained utilizing NASCET criteria, using the distal internal carotid diameter as the denominator. RADIATION DOSE REDUCTION: This exam was performed according to the departmental dose-optimization program which includes automated exposure control, adjustment of the mA and/or kV according to patient size and/or use of iterative reconstruction technique. CONTRAST:  56mL OMNIPAQUE IOHEXOL 350 MG/ML SOLN COMPARISON:  Plain head CT 0318 hours. FINDINGS: CTA NECK Skeleton: Osteopenia. Chronic lower cervical spine disc and endplate degeneration. Multilevel mild upper thoracic compression fractures, such as T3 and T4, appears stable from a 2021 cervical spine CT. No acute osseous  abnormality identified. Upper chest: Mild dependent atelectasis. Mild retained secretions in the proximal thoracic esophagus which is nondilated. No visible mediastinal lymphadenopathy. Visible central pulmonary arteries appear patent. Other neck: Right supraclavicular venous collaterals appear incidental. Retropharyngeal course of both carotids (normal variant). No acute neck soft tissue finding. Aortic arch: Mild Calcified aortic atherosclerosis. Three vessel arch configuration. Right carotid system: Mild brachiocephalic artery calcified plaque without stenosis. Normal right CCA origin. Tortuous right CCA with a retropharyngeal carotid bifurcation. Mild right ICA origin and bulb calcified plaque without stenosis. Left carotid system: Negative left CCA origin. Tortuous left CCA with a retropharyngeal course similar to the right. Mild calcified plaque at the left ICA origin and bulb without stenosis. Vertebral arteries: Mild calcified plaque in the proximal right subclavian artery without stenosis. Minimal calcified plaque at the right vertebral artery origin without stenosis. Right vertebral artery appears mildly non dominant and patent to the skull base without stenosis. Mild proximal left subclavian artery calcified plaque without stenosis. Normal left vertebral artery origin. Dominant left vertebral artery is tortuous in the V2 segment and patent to the skull base without plaque or stenosis. CTA HEAD Posterior circulation: Normal distal vertebral arteries and vertebrobasilar junction. Dominant left V4 segment. Normal PICA origins. Patent basilar artery without stenosis. Normal SCA and left PCA origin. Fetal type right PCA origin. Left posterior communicating artery diminutive or absent. Bilateral PCA branches are within normal limits. Anterior circulation: Both ICA siphons are patent and tortuous. Mild to moderate calcified plaque on the left but only mild left siphon stenosis. Similar calcified plaque on the  right with no significant stenosis. Normal right posterior communicating artery origin. Small infundibulum of the distal left ICA (normal variant). Patent carotid termini. Normal MCA and ACA origins. Normal anterior communicating artery. Bilateral ACA branches are within normal limits. Left MCA M1 segment and bifurcation are patent without stenosis. Right MCA M1 segment and bifurcation are patent without stenosis. Bilateral MCA branches are within normal limits. Venous sinuses: Patent. Anatomic variants: Dominant left vertebral artery. Fetal type right PCA origin. Review of the MIP images confirms the above findings IMPRESSION: 1. Negative for large vessel occlusion. And generally mild for age atherosclerosis in the head and neck, most pronounced at the ICA siphons. No significant arterial stenosis. 2. Aortic Atherosclerosis (ICD10-I70.0). 3. Osteopenia with multiple mild chronic upper thoracic compression fractures. Electronically Signed   By: Genevie Ann M.D.   On: 06/20/2021 04:16   MR BRAIN WO CONTRAST  Result Date: 06/20/2021 CLINICAL DATA:  81 year old female code stroke presentation. Left side weakness. EXAM: MRI HEAD WITHOUT CONTRAST TECHNIQUE: Multiplanar,  multiecho pulse sequences of the brain and surrounding structures were obtained without intravenous contrast. COMPARISON:  Brain MRI 09/14/2012. FINDINGS: The examination had to be discontinued prior to completion due to patient back pain. Only DWI, axial T2* and FLAIR imaging was obtained. Mildly motion degraded axial and coronal DWI imaging demonstrates multifocal, widely scattered, generally small foci of restricted diffusion in the bilateral cerebral and cerebellar hemispheres. Most confluent area is in the right centrum semiovale near the pre motor area on series 6, image 40. Essentially all vascular territories affected. Deep gray matter nuclei and brainstem are spared. No intracranial mass effect. Stable ventricle size and configuration. Basilar  cisterns appear normal. Patchy additional bilateral cerebral white matter and central pontine FLAIR heterogeneity appears increased since 2014. No evidence of acute or chronic intracranial hemorrhage. IMPRESSION: 1. Small acute infarcts - most punctate - scattered throughout the bilateral cerebral and cerebellar hemispheres in keeping with a recent Embolic event from the heart or proximal aorta. Largest area of involvement is in the right frontal lobe pre-motor white matter area. No associated mass effect or hemorrhage. 2. Truncated exam, only three imaging sequences obtained. Electronically Signed   By: Genevie Ann M.D.   On: 06/20/2021 08:30   IR Fluoro Guide CV Line Left  Result Date: 06/20/2021 INDICATION: 81 year old with TTP.  Patient needs plasmapheresis. EXAM: FLUOROSCOPIC AND ULTRASOUND GUIDED PLACEMENT OF A NON-TUNNELED DIALYSIS CATHETER Physician: Stephan Minister. Henn, MD MEDICATIONS: 1% lidocaine ANESTHESIA/SEDATION: Local anesthetic FLUOROSCOPY TIME:  Radiation Exposure Index (as provided by the fluoroscopic device): 9 mGy Kerma COMPLICATIONS: None immediate. PROCEDURE: Informed consent was obtained for catheter placement. The patient was placed supine on the interventional table. Both left and right internal jugular veins appear to be occluded with ultrasound. Left external jugular vein was patent. Ultrasound was used for the procedure but an image was not saved. The left neck was prepped and draped in a sterile fashion. The left neck was anesthetized with 1% lidocaine. Maximal barrier sterile technique was utilized including caps, mask, sterile gowns, sterile gloves, sterile drape, hand hygiene and skin antiseptic. A small incision was made with #11 blade scalpel. A 21 gauge needle directed into the left external jugular vein with ultrasound guidance. A micropuncture dilator set was placed. A 24 cm Mahurkar catheter was selected. The catheter was advanced over a wire and positioned at the superior cavoatrial  junction. Fluoroscopic images were obtained for documentation. Both dialysis lumens were found to aspirate and flush well. The proper amount of heparin was flushed in both lumens. The central venous lumen was flushed with normal saline. Catheter was sutured to skin. FINDINGS: Catheter tip at the superior cavoatrial junction. Left and right internal jugular veins appear to be occluded in the lower neck. Catheter was placed through the left external jugular vein. IMPRESSION: Successful placement of a left external jugular non-tunneled dialysis catheter using ultrasound and fluoroscopic guidance. Electronically Signed   By: Markus Daft M.D.   On: 06/20/2021 20:51   IR US Guide Vasc Access Left  Result Date: 06/20/2021 INDICATION: 81 year old with TTP.  Patient needs plasmapheresis. EXAM: FLUOROSCOPIC AND ULTRASOUND GUIDED PLACEMENT OF A NON-TUNNELED DIALYSIS CATHETER Physician: Stephan Minister. Henn, MD MEDICATIONS: 1% lidocaine ANESTHESIA/SEDATION: Local anesthetic FLUOROSCOPY TIME:  Radiation Exposure Index (as provided by the fluoroscopic device): 9 mGy Kerma COMPLICATIONS: None immediate. PROCEDURE: Informed consent was obtained for catheter placement. The patient was placed supine on the interventional table. Both left and right internal jugular veins appear to be occluded with ultrasound. Left external  jugular vein was patent. Ultrasound was used for the procedure but an image was not saved. The left neck was prepped and draped in a sterile fashion. The left neck was anesthetized with 1% lidocaine. Maximal barrier sterile technique was utilized including caps, mask, sterile gowns, sterile gloves, sterile drape, hand hygiene and skin antiseptic. A small incision was made with #11 blade scalpel. A 21 gauge needle directed into the left external jugular vein with ultrasound guidance. A micropuncture dilator set was placed. A 24 cm Mahurkar catheter was selected. The catheter was advanced over a wire and positioned at the  superior cavoatrial junction. Fluoroscopic images were obtained for documentation. Both dialysis lumens were found to aspirate and flush well. The proper amount of heparin was flushed in both lumens. The central venous lumen was flushed with normal saline. Catheter was sutured to skin. FINDINGS: Catheter tip at the superior cavoatrial junction. Left and right internal jugular veins appear to be occluded in the lower neck. Catheter was placed through the left external jugular vein. IMPRESSION: Successful placement of a left external jugular non-tunneled dialysis catheter using ultrasound and fluoroscopic guidance. Electronically Signed   By: Markus Daft M.D.   On: 06/20/2021 20:51   DG OR UROLOGY CYSTO IMAGE ([facility] ONLY)  Result Date: 06/18/2021 There is no interpretation for this exam.  This order is for images obtained during a surgical procedure.  Please See "Surgeries" Tab for more information regarding the procedure.   CT HEMATURIA WORKUP  Result Date: 06/01/2021 CLINICAL DATA:  Gross hematuria in a 81 year old female. EXAM: CT ABDOMEN AND PELVIS WITHOUT AND WITH CONTRAST TECHNIQUE: Multidetector CT imaging of the abdomen and pelvis was performed following the standard protocol before and following the bolus administration of intravenous contrast. RADIATION DOSE REDUCTION: This exam was performed according to the departmental dose-optimization program which includes automated exposure control, adjustment of the mA and/or kV according to patient size and/or use of iterative reconstruction technique. CONTRAST:  132mL OMNIPAQUE IOHEXOL 300 MG/ML  SOLN COMPARISON:  Sep 24, 2020. FINDINGS: Lower chest: Incidental imaging of the lung bases without sign of effusion or consolidative change. 5 mm LEFT upper lobe pulmonary nodule (image 2/9) not imaged on previous imaging studies. No consolidation. No pleural effusion. Heart size unremarkable though incompletely imaged. Hepatobiliary: No focal, suspicious hepatic  lesion. No pericholecystic stranding. No biliary duct dilation. Portal vein is patent. Pancreas: Normal, without mass, inflammation or ductal dilatation. Spleen: Normal. Adrenals/Urinary Tract: Adrenal glands are normal. Cortical scarring of the bilateral kidneys without signs of hydronephrosis or perinephric stranding. Tiny intrarenal calculus in the interpolar RIGHT kidney measures 2-3 mm. Bilateral renal cysts are noted not substantially changed from prior imaging. No abnormal enhancement along the course of the LEFT or RIGHT ureter but with a small distal RIGHT ureteral calculus (image 64/7) this measures 3 mm. Subtle nodularity is present at the LEFT bladder base (image 72/7) 5 mm. Urinary bladder is collapsed which does limit assessment. Filling defect as well as enhancing area seen at the LEFT bladder base near the UVJ (image 68/13 in addition to the above referenced image. Stomach/Bowel: Normal appendix.  No acute gastrointestinal findings. Vascular/Lymphatic: Aortic atherosclerosis. No sign of aneurysm. Smooth contour of the IVC. There is no gastrohepatic or hepatoduodenal ligament lymphadenopathy. No retroperitoneal or mesenteric lymphadenopathy. No pelvic sidewall lymphadenopathy. Reproductive: Lobulated LEFT adnexal structure with low attenuation but with intermediate density, 39 Hounsfield units measuring 4.3 x 4.2 x 6.3 cm previously 4.8 x 4.6 x 6.0 cm. This does measure  greatest craniocaudal dimension. RIGHT adnexa unremarkable by CT. Other: No ascites. Musculoskeletal: No acute bone finding. No destructive bone process. Spinal degenerative changes. Excretory phase: Filling defect of the LEFT posterolateral urinary bladder base near the UVJ as described no visible upper tract lesion. IMPRESSION: 1. Filling defect with enhancement in the LEFT posterolateral urinary bladder base near the LEFT UVJ suspicious for small urothelial neoplasm. Cystoscopy is advised. 2. 3 mm distal RIGHT ureteral calculus  without current signs of obstruction. 3. Bilateral renal cysts. 4. 5 mm LEFT upper lobe pulmonary nodule not imaged on previous imaging studies. If bladder findings are compatible with neoplasm [employer] guidelines would not apply. Suggest close attention on follow-up, if bladder cancer is diagnosed 3 month follow-up of the chest may be warranted. 5. Lobulated LEFT adnexal structure with low attenuation but with intermediate density, 39 Hounsfield units. This does measure greatest craniocaudal dimension. This does measure 4.3 x 4.2 x 6.3 cm previously 4.8 x 4.6 x 6.0 cm. Given that this may represent a hemorrhagic cystic lesion of the adnexa in a postmenopausal patient greater than 6 cm would suggest follow-up pelvic sonogram if not yet performed. 6. Aortic atherosclerosis. Aortic Atherosclerosis (ICD10-I70.0). These results will be called to the ordering clinician or representative by the Radiologist Assistant, and communication documented in the PACS or [employer]. Electronically Signed   By: Zetta Bills M.D.   On: 06/01/2021 14:31   CT HEAD CODE STROKE WO CONTRAST  Result Date: 06/20/2021 CLINICAL DATA:  Code stroke. EXAM: CT HEAD WITHOUT CONTRAST TECHNIQUE: Contiguous axial images were obtained from the base of the skull through the vertex without intravenous contrast. RADIATION DOSE REDUCTION: This exam was performed according to the departmental dose-optimization program which includes automated exposure control, adjustment of the mA and/or kV according to patient size and/or use of iterative reconstruction technique. COMPARISON:  Prior CT from 11/02/2019. FINDINGS: Brain: Age-related cerebral atrophy with chronic small vessel ischemic disease. Small parenchymal calcific density at the posterior left frontal periventricular white matter, stable. No acute intracranial hemorrhage. No visible acute large vessel territory infarct. No mass lesion or midline shift. No hydrocephalus or  extra-axial fluid collection. Vascular: No hyperdense vessel. Scattered vascular calcifications noted within the carotid siphons. Skull: Scalp soft tissues and calvarium within normal limits. Calvarium intact. Sinuses/Orbits: Globes and orbital soft tissues demonstrate no acute finding. Scattered mucosal thickening noted within the ethmoidal air cells. Paranasal sinuses are otherwise clear. Trace left mastoid effusion noted. Other: None. ASPECTS [facility] Stroke Program Early CT Score) - Ganglionic level infarction (caudate, lentiform nuclei, internal capsule, insula, M1-M3 cortex): 7 - Supraganglionic infarction (M4-M6 cortex): 3 Total score (0-10 with 10 being normal): 10 IMPRESSION: 1. No acute intracranial abnormality. 2. ASPECTS is 10. 3. 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 These results were communicated to the nurse practitioner taking care of the patient, Sharion Settler at 3:32 am on 06/20/2021 by text page via the [facility] messaging system. Electronically Signed   By: Jeannine Boga M.D.   On: 06/20/2021 03:35     Future Appointments  Date Time Provider [place]  06/29/2021 11:30 AM [facility] PHLEBOTOMIST [facility] None  06/29/2021 12:00 PM Ladell Pier, MD [facility] None      LOS: 1 day

## 2021-06-22 ENCOUNTER — Inpatient Hospital Stay (HOSPITAL_COMMUNITY): Payer: Medicare Other

## 2021-06-22 LAB — COMPREHENSIVE METABOLIC PANEL
ALT: 16 U/L (ref 0–44)
AST: 33 U/L (ref 15–41)
Albumin: 3 g/dL — ABNORMAL LOW (ref 3.5–5.0)
Alkaline Phosphatase: 35 U/L — ABNORMAL LOW (ref 38–126)
Anion gap: 11 (ref 5–15)
BUN: 52 mg/dL — ABNORMAL HIGH (ref 8–23)
CO2: 28 mmol/L (ref 22–32)
Calcium: 9.7 mg/dL (ref 8.9–10.3)
Chloride: 98 mmol/L (ref 98–111)
Creatinine, Ser: 2.24 mg/dL — ABNORMAL HIGH (ref 0.44–1.00)
GFR, Estimated: 22 mL/min — ABNORMAL LOW (ref >60)
Glucose, Bld: 161 mg/dL — ABNORMAL HIGH (ref 70–99)
Potassium: 4.1 mmol/L (ref 3.5–5.1)
Sodium: 137 mmol/L (ref 135–145)
Total Bilirubin: 1 mg/dL (ref 0.3–1.2)
Total Protein: 5.3 g/dL — ABNORMAL LOW (ref 6.5–8.1)

## 2021-06-22 LAB — CBC WITH DIFFERENTIAL/PLATELET
Abs Immature Granulocytes: 0.07 10*3/uL (ref 0.00–0.07)
Basophils Absolute: 0 10*3/uL (ref 0.0–0.1)
Basophils Relative: 0 %
Eosinophils Absolute: 0 10*3/uL (ref 0.0–0.5)
Eosinophils Relative: 0 %
HCT: 41.2 % (ref 36.0–46.0)
Hemoglobin: 14.3 g/dL (ref 12.0–15.0)
Immature Granulocytes: 1 %
Lymphocytes Relative: 3 %
Lymphs Abs: 0.4 10*3/uL — ABNORMAL LOW (ref 0.7–4.0)
MCH: 33.3 pg (ref 26.0–34.0)
MCHC: 34.7 g/dL (ref 30.0–36.0)
MCV: 95.8 fL (ref 80.0–100.0)
Monocytes Absolute: 0.2 10*3/uL (ref 0.1–1.0)
Monocytes Relative: 2 %
Neutro Abs: 11.8 10*3/uL — ABNORMAL HIGH (ref 1.7–7.7)
Neutrophils Relative %: 94 %
Platelets: 33 10*3/uL — ABNORMAL LOW (ref 150–400)
RBC: 4.3 MIL/uL (ref 3.87–5.11)
RDW: 12.7 % (ref 11.5–15.5)
WBC: 12.5 10*3/uL — ABNORMAL HIGH (ref 4.0–10.5)
nRBC: 0 % (ref 0.0–0.2)

## 2021-06-22 LAB — ADAMTS13 ACTIVITY: Adamts 13 Activity: <2 % — CL (ref >66.8)

## 2021-06-22 LAB — TYPE AND SCREEN
ABO/RH(D): O POS
Antibody Screen: NEGATIVE

## 2021-06-22 LAB — THERAPEUTIC PLASMA EXCHANGE (BLOOD BANK)
Plasma Exchange: 2200
Plasma volume needed: 2200
Unit division: 0
Unit division: 0
Unit division: 0
Unit division: 0
Unit division: 0
Unit division: 0
Unit division: 0

## 2021-06-22 LAB — RETICULOCYTES
Immature Retic Fract: 6.4 % (ref 2.3–15.9)
RBC.: 4.32 MIL/uL (ref 3.87–5.11)
Retic Count, Absolute: 44.9 10*3/uL (ref 19.0–186.0)
Retic Ct Pct: 1 % (ref 0.4–3.1)

## 2021-06-22 LAB — ADAMTS13 ANTIBODY: ADAMTS13 Antibody: 10 Units/mL (ref <12)

## 2021-06-22 LAB — LACTATE DEHYDROGENASE: LDH: 278 U/L — ABNORMAL HIGH (ref 98–192)

## 2021-06-22 MED ORDER — DIPHENHYDRAMINE HCL 25 MG PO CAPS
25.0000 mg | ORAL_CAPSULE | Freq: Four times a day (QID) | ORAL | Status: DC | PRN
  Administered 2021-06-22: 25 mg via ORAL
  Filled 2021-06-22: qty 1

## 2021-06-22 MED ORDER — ANTICOAGULANT SODIUM CITRATE 4% (200MG/5ML) IV SOLN
5.0000 mL | Status: AC
  Administered 2021-06-22: 5 mL
  Filled 2021-06-22: qty 5

## 2021-06-22 MED ORDER — CALCIUM CARBONATE ANTACID 500 MG PO CHEW
2.0000 | CHEWABLE_TABLET | ORAL | Status: AC
  Administered 2021-06-22 (×2): 400 mg via ORAL
  Filled 2021-06-22: qty 2

## 2021-06-22 MED ORDER — ACD FORMULA A 0.73-2.45-2.2 GM/100ML VI SOLN
1000.0000 mL | Status: DC
  Administered 2021-06-22: 1000 mL
  Filled 2021-06-22: qty 1000

## 2021-06-22 MED ORDER — ACETAMINOPHEN 325 MG PO TABS
650.0000 mg | ORAL_TABLET | ORAL | Status: DC | PRN
  Administered 2021-06-22: 650 mg via ORAL
  Filled 2021-06-22: qty 2

## 2021-06-22 MED ORDER — METHYLPREDNISOLONE SODIUM SUCC 125 MG IJ SOLR
125.0000 mg | Freq: Every day | INTRAMUSCULAR | Status: DC
  Administered 2021-06-22: 125 mg via INTRAVENOUS
  Filled 2021-06-22: qty 2

## 2021-06-22 MED ORDER — CALCIUM GLUCONATE-NACL 2-0.675 GM/100ML-% IV SOLN
2.0000 g | Freq: Once | INTRAVENOUS | Status: AC
  Administered 2021-06-22: 2000 mg via INTRAVENOUS
  Filled 2021-06-22: qty 100

## 2021-06-22 NOTE — Progress Notes (Addendum)
[name]  NAT:[id number] DOB: [birth year] DOA: 06/20/2021 PCP: Kirk Ruths, MD    Brief Narrative:  81yo female with history of DVT/PE, TTP, COPD, tobacco use, and depression who underwent right ureteroscopy with laser lithotripsy of distal ureteral stone, right ureteroscopy and laser lithotripsy of renal stone, right ureteral stent placement, and bladder biopsy and fulguration with instillation of gemcitabine 06/18/21.  After returning home her stent became partially dislodged during a coughing spell, so she and her daughter completely removed it. She subsequently reported worsening right-sided flank pain.  She presented to the [facility] ED where she was found to have pyelonephritis and started on IV Rocephin.   Overnight 2/18 > 2/19 while at [facility] the patient developed left-sided weakness and a code stroke was called.  Her symptoms soon after started improving, though not fully resolving. CT head/CTa head did not note any acute findings or large vessel occlusions. MRI was consistent with small multiple infarcts in B hemispheres. Her platelets trended down abruptly from 89K > 24K > 16K. It was suspected that she was in a TTP flare therefore arrangements were made to transfer her to [facility] for plasmapheresis.  Consultants:  Hematology IR  Code Status: FULL CODE  Antimicrobials:  Rocephin  DVT prophylaxis: SCDs  Interim Hx: Afebrile.  Vital signs stable.  Saturation 95% on 3 L.  Renal function holding steady for now.  Some ongoing word finding difficulty but able to communicate well.  Some persisting left arm weakness but stable since admission.  Assessment & Plan:  TTP w/ acute exacerbation Continue plasmapheresis under direction of Hematology   Acute left-sided weakness CTA neck unremarkable for any large vessel occlusion - MRI noted multiple B acute small multifocal infarcts - felt to be due to TTP - avoid ASA/Plavix due to TTP - PT/OT/SLP   Acute complicated  pyelonephritis due to recent urinary instrumentation/stent underwent recent ureteroscopy, laser lithotripsy and stent placement - stent dislodged day prior to admit - CT revealed right-sided hydronephrosis with perinephric stranding - was seen by Urology at [facility] who recommended abx tx only   Noninvasive urothelial carcinoma, high-grade Results from recent bladder biopsy - to f/u w/ Urology as outpt  Acute kidney failure on CKD stage IIIa Baseline creatinine dating to January of this year approximately 1.3 with GFR 40-48 -acute worsening felt to be combination of pyelonephritis, stent removal, and acute critical illness with TTP -monitor trend - check renal ultrasound to assure no evidence of acute worsening hydronephrosis  Recent Labs  Lab 06/19/21 0814 06/20/21 0608 06/20/21 1732 06/21/21 0402 06/22/21 0255  CREATININE 1.60* 0.74 2.20* 2.21* 2.24*      Nephrolithiasis Status post recent ureteroscopy as noted above    Nicotine dependence nicotine patch   COPD  Well compensated at this time    Family Communication: Spoke with patient and family member at bedside Disposition: From home -disposition not clear at present but may require rehabilitation stay once TTP stabilized  Objective: Blood pressure (!) 141/60, pulse 81, temperature 98.8 F (37.1 C), temperature source Oral, resp. rate (!) 21, height 5\' 4"  (1.626 m), weight 81.8 kg, SpO2 95 %.  Intake/Output Summary (Last 24 hours) at 06/22/2021 1000 Last data filed at 06/22/2021 4270 Gross per 24 hour  Intake 240 ml  Output 225 ml  Net 15 ml   Filed Weights   06/21/21 0920 06/22/21 0500 06/22/21 0926  Weight: 80 kg 84.4 kg 81.8 kg    Examination: General: No acute respiratory distress Lungs:  Clear to auscultation bilaterally -no wheezing Cardiovascular: Regular rate and rhythm without murmur  Abdomen: NT/ND, soft, BS positive, no rebound Extremities: Trace edema bilateral lower extremities  CBC: Recent Labs   Lab 06/20/21 1056 06/21/21 0402 06/22/21 0255  WBC 11.0* 8.1 12.5*  NEUTROABS 8.4* 7.6 11.8*  HGB 14.7 14.0 14.3  HCT 45.1 41.2 41.2  MCV 99.6 96.0 95.8  PLT 16* 18* 33*    Basic Metabolic Panel: Recent Labs  Lab 06/19/21 0814 06/20/21 0608 06/20/21 1732 06/21/21 0402 06/22/21 0255  NA 138 138 135 138 137  K 4.1 4.4 4.1 3.9 4.1  CL 100 102 100 99 98  CO2 28 26 25 28 28   GLUCOSE 149* 101* 143* 144* 161*  BUN 33* 35* 32* 39* 52*  CREATININE 1.60* 0.74 2.20* 2.21* 2.24*  CALCIUM 9.4 9.0 9.0 9.6 9.7  MG 2.0 2.0  --  1.8  --   PHOS  --  2.2*  --  4.4  --     GFR: Estimated Creatinine Clearance: 21.1 mL/min (A) (by C-G formula based on SCr of 2.24 mg/dL (H)).  Liver Function Tests: Recent Labs  Lab 06/19/21 0814 06/20/21 0608 06/20/21 0654 06/21/21 0402 06/22/21 0255  AST 29 33  --  36 33  ALT 18 18  --  21 16  ALKPHOS 38 32*  --  38 35*  BILITOT 1.1 1.2 1.0 0.9 1.0  PROT 7.3 6.4*  --  5.9* 5.3*  ALBUMIN 4.3 3.7  --  3.4* 3.0*     HbA1C: Hemoglobin A1C  Date/Time Value Ref Range Status  09/14/2012 05:44 AM 5.4 4.2 - 6.3 % Final    Comment:    The American Diabetes Association recommends that a primary goal of therapy should be <7% and that physicians should reevaluate the treatment regimen in patients with HbA1c values consistently >8%.    Hgb A1c MFr Bld  Date/Time Value Ref Range Status  06/20/2021 06:08 AM 5.3 4.8 - 5.6 % Final    Comment:    (NOTE) Pre diabetes:          5.7%-6.4%  Diabetes:              >6.4%  Glycemic control for   <7.0% adults with diabetes   08/20/2013 01:35 PM 5.8 (H) <5.7 % Final    Comment:    (NOTE)                                                                       According to the ADA Clinical Practice Recommendations for 2011, when HbA1c is used as a screening test:  >=6.5%   Diagnostic of Diabetes Mellitus           (if abnormal result is confirmed) 5.7-6.4%   Increased risk of developing Diabetes  [place] References:Diagnosis and Classification of Diabetes Mellitus,Diabetes EGBT,5176,16(WVPXT 1):S62-S69 and Standards of Medical Care in         Diabetes - 2011,Diabetes GGYI,9485,46 (Suppl 1):S11-S61.     Recent Results (from the past 240 hour(s))  Urine Culture     Status: None   Collection Time: 06/19/21  8:14 AM   Specimen: Urine, Clean Catch  Result Value Ref Range Status   Specimen Description  Final    URINE, CLEAN CATCH Performed at [facility], [address] [place], [place] [address]    Special Requests   Final    NONE Performed at [facility], [address], [place], [place] [address]    Culture   Final    NO GROWTH Performed at [facility], [place] [address] [place], [place] [address]    Report Status 06/20/2021 FINAL  Final  Blood culture (routine x 2)     Status: None (Preliminary result)   Collection Time: 06/19/21 10:23 AM   Specimen: BLOOD  Result Value Ref Range Status   Specimen Description BLOOD BLOOD RIGHT WRIST  Final   Special Requests   Final    BOTTLES DRAWN AEROBIC AND ANAEROBIC Blood Culture results may not be optimal due to an inadequate volume of blood received in culture bottles   Culture   Final    NO GROWTH 3 DAYS Performed at [facility], [address], [place],  [address]    Report Status PENDING  Incomplete  Resp Panel by RT-PCR (Flu A&B, Covid) Nasopharyngeal Swab     Status: None   Collection Time: 06/19/21 10:23 AM   Specimen: Nasopharyngeal Swab; Nasopharyngeal(NP) swabs in vial transport medium  Result Value Ref Range Status   SARS Coronavirus 2 by RT PCR NEGATIVE NEGATIVE Final    Comment: (NOTE) SARS-CoV-2 target nucleic acids are NOT DETECTED.  The SARS-CoV-2 RNA is generally detectable in upper respiratory specimens during the acute phase of infection. The lowest concentration of SARS-CoV-2 viral copies this assay can detect is 138 copies/mL. A negative result does not  preclude SARS-Cov-2 infection and should not be used as the sole basis for treatment or other patient management decisions. A negative result may occur with  improper specimen collection/handling, submission of specimen other than nasopharyngeal swab, presence of viral mutation(s) within the areas targeted by this assay, and inadequate number of viral copies(<138 copies/mL). A negative result must be combined with clinical observations, patient history, and epidemiological information. The expected result is Negative.  Fact Sheet for Patients:  EntrepreneurPulse.com.au  Fact Sheet for Healthcare Providers:  IncredibleEmployment.be  This test is no t yet approved or cleared by the [place] FDA and  has been authorized for detection and/or diagnosis of SARS-CoV-2 by FDA under an Emergency Use Authorization (EUA). This EUA will remain  in effect (meaning this test can be used) for the duration of the COVID-19 declaration under Section 564(b)(1) of the Act, 21 U.S.C.section 360bbb-3(b)(1), unless the authorization is terminated  or revoked sooner.       Influenza A by PCR NEGATIVE NEGATIVE Final   Influenza B by PCR NEGATIVE NEGATIVE Final    Comment: (NOTE) The Xpert Xpress SARS-CoV-2/FLU/RSV plus assay is intended as an aid in the diagnosis of influenza from Nasopharyngeal swab specimens and should not be used as a sole basis for treatment. Nasal washings and aspirates are unacceptable for Xpert Xpress SARS-CoV-2/FLU/RSV testing.  Fact Sheet for Patients: EntrepreneurPulse.com.au  Fact Sheet for Healthcare Providers: IncredibleEmployment.be  This test is not yet approved or cleared by the [place] FDA and has been authorized for detection and/or diagnosis of SARS-CoV-2 by FDA under an Emergency Use Authorization (EUA). This EUA will remain in effect (meaning this test can be used) for the  duration of the COVID-19 declaration under Section 564(b)(1) of the Act, 21 U.S.C. section 360bbb-3(b)(1), unless the authorization is terminated or revoked.  Performed at [facility]  Lab, [address] [place], [place] [address]   Blood culture (routine x 2)     Status: None (Preliminary result)   Collection Time: 06/19/21  2:27 PM   Specimen: BLOOD  Result Value Ref Range Status   Specimen Description BLOOD RIGHT ANTECUBITAL  Final   Special Requests   Final    BOTTLES DRAWN AEROBIC AND ANAEROBIC Blood Culture adequate volume   Culture   Final    NO GROWTH 3 DAYS Performed at [facility], [address], [place], [place] [address]    Report Status PENDING  Incomplete  MRSA Next Gen by PCR, Nasal     Status: None   Collection Time: 06/20/21 12:00 PM   Specimen: Nasal Mucosa; Nasal Swab  Result Value Ref Range Status   MRSA by PCR Next Gen NOT DETECTED NOT DETECTED Final    Comment: (NOTE) The GeneXpert MRSA Assay (FDA approved for NASAL specimens only), is one component of a comprehensive MRSA colonization surveillance program. It is not intended to diagnose MRSA infection nor to guide or monitor treatment for MRSA infections. Test performance is not FDA approved in patients less than 62 years old. Performed at [facility], [address] [place], [place] [address]       Scheduled Meds:  calcium carbonate  2 tablet Oral Q3H   Chlorhexidine Gluconate Cloth  6 each Topical Daily   cholecalciferol  400 Units Oral Daily   FLUoxetine  20 mg Oral Daily   folic acid  1 mg Oral Daily   latanoprost  1 drop Both Eyes QHS   methylPREDNISolone sodium succinate  125 mg Intravenous Daily   multivitamin with minerals  1 tablet Oral Daily   nicotine  21 mg Transdermal Daily   pantoprazole  40 mg Oral Q1200   QUEtiapine  25 mg Oral QHS   rosuvastatin  20 mg Oral QHS   tiotropium  18 mcg Inhalation Daily   cyanocobalamin  1,000 mcg Oral Daily    Continuous Infusions:  anticoagulant sodium citrate     calcium gluconate 2,000 mg (06/22/21 0945)   cefTRIAXone (ROCEPHIN) IVPB 1 gram/100 mL NS (Mini-Bag Plus) 1 g (06/22/21 0504)   citrate dextrose       LOS: 2 days   Cherene Altes, MD [facility] Office  [phone] Pager - Text Page per Amion  If 7PM-7AM, please contact night-coverage per Amion 06/22/2021, 10:00 AM

## 2021-06-22 NOTE — Progress Notes (Addendum)
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, steroids, rituximab Relapse of TTP March 2015-plasma exchange, steroids, rituximab, and maintenance azathioprine Admission with severe thrombocytopenia and elevated LDH 07/16/2020, ADAMTS13 activity less than 2%, consistent with relapse of TTP       -Steroids/FFP given 07/16/2020       -Daily plasma exchange beginning 07/17/2020, 7 days, then qod starting 3/24, discharge 07/21/2020 on prednisone 60 mg daily       -Prednisone taper to 40 mg daily 07/29/2020       -rituximab 07/30/2020, 08/06/2020, 08/13/2020, 08/20/2020       -plasmapheresis on 07/31/2020, 08/03/2020, 08/05/2020, and 08/07/2020       -prednisone increased to 60 mg daily 08/17/2020       -Plasmapheresis 08/24/2020, 08/25/2020, 08/26/2020, 08/27/2020, 08/28/2020, 08/29/2020, 08/31/2020, 09/02/2020, 09/04/2020       -caplacizumab initiated 08/24/2020       -Decrease prednisone to 40 mg daily 08/28/2020       -ADAMTS13 37% on 09/04/2020       -Prednisone decreased to 30 mg daily 09/07/2020       -Continue caplacizumab       -prednisone decreased to 20 mg daily beginning 09/18/2020       -Cablivi discontinued 09/21/2020       -prednisone discontinued 09/24/2020, resumed at a dose of 20 mg daily on 10/01/2020       -prednisone taper to 10 mg daily 10/23/2020       -Prednisone taper to 5 mg daily 11/06/2020       -prednisone continued at a dose of 5 mg daily, Imuran resumed at 50 mg daily 12/03/2020 History of epidural abscess requiring laminectomy 2012 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 Cristina Gong- no active bleeding or blood  in the stomach, nor any prospective bleeding site, seen on exam. Hospital admission 06/19/2021 to [facility] with subsequent transfer to [facility], CVA, acute complicated pyelonephritis  Ms. appears stable.  She continues to have some mild word finding difficulty and left-sided weakness which overall seem to be improving.  She has some lab findings concerning for TTP including low platelet count and elevated LDH compared with baseline.  ADAMTS 13 is pending.  However, her ADAMTS 13 activity level is consistently low.  Plan is to continue daily plasmapheresis.  Today is day 3 of her plasmapheresis.  She is receiving Solu-Medrol 125 mg every 8 hours.  We will reduce this to 125 mg IV daily.  Creatinine remains elevated above her baseline.  She was noted to have hydroureteronephrosis on CT scan.  She recently had a stent placed by urology which has dislodged.  We would recommend a urology consult to see if stent needs to be replaced.  She had a recent right ureteroscopy and cystoscopy as well as bladder biopsy with instillation of gemcitabine on 06/18/2021.  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  The pathology results were discussed with the patient and her daughter this morning.  Recommendations: 1.  Continue daily plasmapheresis. 2.  Follow-up on ADAMTS 13 result, though her level has been chronically low 3.  Reduce Solu-Medrol to 125 mg daily 4.  No plans for  caplacizumab given history of GI bleed while on this medication. 5.  Consider urology/nephrology consults if the creatinine remains elevated, recent CT contrast and hydronephrosis on CT 6.  Daily CBC, chemistry panel, and LDH 7.  Hematology will continue following her in the hospital and outpatient follow-up will be scheduled at the Cancer center.  Mikey Bussing, DNP, AGPCNP-BC, AOCNP  [name] was interviewed and examined.  The platelet count is higher today. Plan is  to continue daily plasma exchange and once daily solumedrol.  The creatinine is stable. Dr Thereasa Solo has ordered a renal US.  I discussed the cystoscopy procedure and pathology with her daughter.  I was present for greater than 50% of todays visit.  I performed medical decision making  SUBJECTIVE: [name] had day 3 of plasmapheresis earlier today.  Tolerated well.  No family at the bedside.  She is sitting in a recliner.  Thinks left-sided weakness is improving.  No bleeding reported.  PHYSICAL EXAMINATION:  Vitals:   06/22/21 1124 06/22/21 1151  BP: (!) 132/58 (!) 137/55  Pulse: 78 81  Resp: (!) 24 (!) 23  Temp:    SpO2: 95% 94%   Filed Weights   06/21/21 0920 06/22/21 0500 06/22/21 0926  Weight: 80 kg 84.4 kg 81.8 kg    Intake/Output from previous day: 02/20 0701 - 02/21 0700 In: 240 [P.O.:240] Out: 150 [Urine:150]  Physical Exam Constitutional:      General: She is not in acute distress. HENT:     Head: Normocephalic.  Eyes:     General: No scleral icterus. Cardiovascular:     Rate and Rhythm: Normal rate.  Pulmonary:     Effort: Pulmonary effort is normal. No respiratory distress.  Abdominal:     Palpations: Abdomen is soft.     Tenderness: There is no abdominal tenderness.  Musculoskeletal:     Comments: Strength 5/5 on the right, 3/5 on the left  Skin:    Comments: Ecchymoses on her legs  Neurological:     Mental Status: She is alert.     Comments: She has some word finding difficulties, but improved today    LABORATORY DATA:  I have reviewed the data as listed CMP Latest Ref Rng & Units 06/22/2021 06/21/2021 06/20/2021  Glucose 70 - 99 mg/dL 161(H) 144(H) 143(H)  BUN 8 - 23 mg/dL 52(H) 39(H) 32(H)  Creatinine 0.44 - 1.00 mg/dL 2.24(H) 2.21(H) 2.20(H)  Sodium 135 - 145 mmol/L 137 138 135  Potassium 3.5 - 5.1 mmol/L 4.1 3.9 4.1  Chloride 98 - 111 mmol/L 98 99 100  CO2 22 - 32 mmol/L 28 28 25   Calcium 8.9 - 10.3 mg/dL 9.7 9.6 9.0  Total Protein 6.5 - 8.1  g/dL 5.3(L) 5.9(L) -  Total Bilirubin 0.3 - 1.2 mg/dL 1.0 0.9 -  Alkaline Phos 38 - 126 U/L 35(L) 38 -  AST 15 - 41 U/L 33 36 -  ALT 0 - 44 U/L 16 21 -    Lab Results  Component Value Date   WBC 12.5 (H) 06/22/2021   HGB 14.3 06/22/2021   HCT 41.2 06/22/2021   MCV 95.8 06/22/2021   PLT 33 (L) 06/22/2021   NEUTROABS 11.8 (H) 06/22/2021   I reviewed the peripheral blood smear.  The platelets are markedly decreased in number.  No platelet clumps..  Rare helmet and schistocytes.  No nucleated red cells.  The polychromasia is not increased.  The majority the white cells are mature neutrophils.  No blasts or other  young forms are seen. No results found for: CEA1, CEA, [id number], CA125, PSA1  CT ABDOMEN PELVIS WO CONTRAST  Result Date: 06/19/2021 CLINICAL DATA:  Flank pain, kidney stone suspected EXAM: CT ABDOMEN AND PELVIS WITHOUT CONTRAST TECHNIQUE: Multidetector CT imaging of the abdomen and pelvis was performed following the standard protocol without IV contrast. RADIATION DOSE REDUCTION: This exam was performed according to the departmental dose-optimization program which includes automated exposure control, adjustment of the mA and/or kV according to patient size and/or use of iterative reconstruction technique. COMPARISON:  CT 05/31/2021 FINDINGS: Lower chest: Bibasilar atelectasis. There are 5 mm nodules in the peripheral lung bases (series 3, image 4). Hepatobiliary: No focal liver abnormality is seen. Possible tiny hyperdense stones in the gallbladder fundus. Gallbladder is nondilated. Pancreas: Unremarkable. No pancreatic ductal dilatation or surrounding inflammatory changes. Spleen: Normal in size without focal abnormality. Adrenals/Urinary Tract: Adrenal glands are unremarkable. Punctate nonobstructive left lower pole renal stone. No left-sided hydronephrosis. Unchanged left lower pole cyst. Right-sided hydroureteronephrosis with perinephric and periureteral stranding. There is a small focus  of gas within the right renal pelvis (series 2, image 30) likely related to recent retrograde pyelogram. Unchanged right renal cysts. There is no visible obstructing stone in the ureter. The previously seen 3 mm stone in the distal right ureter is no longer visible distant with recent ureteroscopy and lithotripsy. There is no stent in the ureter present. There is gas in the bladder consistent with recent procedure. Stomach/Bowel: Small hiatal hernia. The stomach is otherwise within normal limits. There is no evidence of bowel obstruction. The appendix is normal. There are few sigmoid diverticula. Vascular/Lymphatic: Aortoiliac atherosclerotic calcifications. No AAA. No lymphadenopathy. Reproductive: There is a 4.9 x 4.0 x 6.0 cm left adnexal structure with intermediate density, unchanged from prior exam. Other: No bowel containing hernia. No abdominopelvic ascites. No free air. Musculoskeletal: Unchanged chronic T12 compression deformity with 25% height loss anteriorly. Multilevel degenerative changes of the spine with severe degenerative disc disease and facet arthropathy in the lumbar spine. IMPRESSION: Right-sided hydroureteronephrosis with perinephric and perinephric inflammatory stranding. No obstructing stone is visible within the ureter. Small focus of gas within the right renal pelvis, likely related to recent retrograde pyelogram. There is no stent in the ureter. Recommend urology consultation. Unchanged intermediate density left adnexal lesion measuring up to 6.0 cm, which could be a hemorrhagic cyst. Given the patient is postmenopausal, nonemergent pelvic ultrasound is recommended for further evaluation if not yet performed. Two incidental 5 mm pulmonary nodules in the peripheral lung bases. No follow-up needed if patient is low-risk (and has no known or suspected primary neoplasm). Non-contrast chest CT can be considered in 12 months if patient is high-risk. This recommendation follows the consensus  statement: Guidelines for Management of Incidental Pulmonary Nodules Detected on CT Images: From the Fleischner Society 2017; Radiology 2017; 284:228-243. Electronically Signed   By: Maurine Simmering M.D.   On: 06/19/2021 09:45   CT ANGIO HEAD NECK W WO CM  Result Date: 06/20/2021 CLINICAL DATA:  81 year old female code stroke presentation. Left side weakness. EXAM: CT ANGIOGRAPHY HEAD AND NECK TECHNIQUE: Multidetector CT imaging of the head and neck was performed using the standard protocol during bolus administration of intravenous contrast. Multiplanar CT image reconstructions and MIPs were obtained to evaluate the vascular anatomy. Carotid stenosis measurements (when applicable) are obtained utilizing NASCET criteria, using the distal internal carotid diameter as the denominator. RADIATION DOSE REDUCTION: This exam was performed according to the departmental dose-optimization program which includes automated  exposure control, adjustment of the mA and/or kV according to patient size and/or use of iterative reconstruction technique. CONTRAST:  75mL OMNIPAQUE IOHEXOL 350 MG/ML SOLN COMPARISON:  Plain head CT 0318 hours. FINDINGS: CTA NECK Skeleton: Osteopenia. Chronic lower cervical spine disc and endplate degeneration. Multilevel mild upper thoracic compression fractures, such as T3 and T4, appears stable from a 2021 cervical spine CT. No acute osseous abnormality identified. Upper chest: Mild dependent atelectasis. Mild retained secretions in the proximal thoracic esophagus which is nondilated. No visible mediastinal lymphadenopathy. Visible central pulmonary arteries appear patent. Other neck: Right supraclavicular venous collaterals appear incidental. Retropharyngeal course of both carotids (normal variant). No acute neck soft tissue finding. Aortic arch: Mild Calcified aortic atherosclerosis. Three vessel arch configuration. Right carotid system: Mild brachiocephalic artery calcified plaque without stenosis.  Normal right CCA origin. Tortuous right CCA with a retropharyngeal carotid bifurcation. Mild right ICA origin and bulb calcified plaque without stenosis. Left carotid system: Negative left CCA origin. Tortuous left CCA with a retropharyngeal course similar to the right. Mild calcified plaque at the left ICA origin and bulb without stenosis. Vertebral arteries: Mild calcified plaque in the proximal right subclavian artery without stenosis. Minimal calcified plaque at the right vertebral artery origin without stenosis. Right vertebral artery appears mildly non dominant and patent to the skull base without stenosis. Mild proximal left subclavian artery calcified plaque without stenosis. Normal left vertebral artery origin. Dominant left vertebral artery is tortuous in the V2 segment and patent to the skull base without plaque or stenosis. CTA HEAD Posterior circulation: Normal distal vertebral arteries and vertebrobasilar junction. Dominant left V4 segment. Normal PICA origins. Patent basilar artery without stenosis. Normal SCA and left PCA origin. Fetal type right PCA origin. Left posterior communicating artery diminutive or absent. Bilateral PCA branches are within normal limits. Anterior circulation: Both ICA siphons are patent and tortuous. Mild to moderate calcified plaque on the left but only mild left siphon stenosis. Similar calcified plaque on the right with no significant stenosis. Normal right posterior communicating artery origin. Small infundibulum of the distal left ICA (normal variant). Patent carotid termini. Normal MCA and ACA origins. Normal anterior communicating artery. Bilateral ACA branches are within normal limits. Left MCA M1 segment and bifurcation are patent without stenosis. Right MCA M1 segment and bifurcation are patent without stenosis. Bilateral MCA branches are within normal limits. Venous sinuses: Patent. Anatomic variants: Dominant left vertebral artery. Fetal type right PCA origin.  Review of the MIP images confirms the above findings IMPRESSION: 1. Negative for large vessel occlusion. And generally mild for age atherosclerosis in the head and neck, most pronounced at the ICA siphons. No significant arterial stenosis. 2. Aortic Atherosclerosis (ICD10-I70.0). 3. Osteopenia with multiple mild chronic upper thoracic compression fractures. Electronically Signed   By: Genevie Ann M.D.   On: 06/20/2021 04:16   MR BRAIN WO CONTRAST  Result Date: 06/20/2021 CLINICAL DATA:  81 year old female code stroke presentation. Left side weakness. EXAM: MRI HEAD WITHOUT CONTRAST TECHNIQUE: Multiplanar, multiecho pulse sequences of the brain and surrounding structures were obtained without intravenous contrast. COMPARISON:  Brain MRI 09/14/2012. FINDINGS: The examination had to be discontinued prior to completion due to patient back pain. Only DWI, axial T2* and FLAIR imaging was obtained. Mildly motion degraded axial and coronal DWI imaging demonstrates multifocal, widely scattered, generally small foci of restricted diffusion in the bilateral cerebral and cerebellar hemispheres. Most confluent area is in the right centrum semiovale near the pre motor area on series 6, image 40.  Essentially all vascular territories affected. Deep gray matter nuclei and brainstem are spared. No intracranial mass effect. Stable ventricle size and configuration. Basilar cisterns appear normal. Patchy additional bilateral cerebral white matter and central pontine FLAIR heterogeneity appears increased since 2014. No evidence of acute or chronic intracranial hemorrhage. IMPRESSION: 1. Small acute infarcts - most punctate - scattered throughout the bilateral cerebral and cerebellar hemispheres in keeping with a recent Embolic event from the heart or proximal aorta. Largest area of involvement is in the right frontal lobe pre-motor white matter area. No associated mass effect or hemorrhage. 2. Truncated exam, only three imaging sequences  obtained. Electronically Signed   By: Genevie Ann M.D.   On: 06/20/2021 08:30   IR Fluoro Guide CV Line Left  Result Date: 06/20/2021 INDICATION: 81 year old with TTP.  Patient needs plasmapheresis. EXAM: FLUOROSCOPIC AND ULTRASOUND GUIDED PLACEMENT OF A NON-TUNNELED DIALYSIS CATHETER Physician: Stephan Minister. Henn, MD MEDICATIONS: 1% lidocaine ANESTHESIA/SEDATION: Local anesthetic FLUOROSCOPY TIME:  Radiation Exposure Index (as provided by the fluoroscopic device): 9 mGy Kerma COMPLICATIONS: None immediate. PROCEDURE: Informed consent was obtained for catheter placement. The patient was placed supine on the interventional table. Both left and right internal jugular veins appear to be occluded with ultrasound. Left external jugular vein was patent. Ultrasound was used for the procedure but an image was not saved. The left neck was prepped and draped in a sterile fashion. The left neck was anesthetized with 1% lidocaine. Maximal barrier sterile technique was utilized including caps, mask, sterile gowns, sterile gloves, sterile drape, hand hygiene and skin antiseptic. A small incision was made with #11 blade scalpel. A 21 gauge needle directed into the left external jugular vein with ultrasound guidance. A micropuncture dilator set was placed. A 24 cm Mahurkar catheter was selected. The catheter was advanced over a wire and positioned at the superior cavoatrial junction. Fluoroscopic images were obtained for documentation. Both dialysis lumens were found to aspirate and flush well. The proper amount of heparin was flushed in both lumens. The central venous lumen was flushed with normal saline. Catheter was sutured to skin. FINDINGS: Catheter tip at the superior cavoatrial junction. Left and right internal jugular veins appear to be occluded in the lower neck. Catheter was placed through the left external jugular vein. IMPRESSION: Successful placement of a left external jugular non-tunneled dialysis catheter using ultrasound  and fluoroscopic guidance. Electronically Signed   By: Markus Daft M.D.   On: 06/20/2021 20:51   IR US Guide Vasc Access Left  Result Date: 06/20/2021 INDICATION: 81 year old with TTP.  Patient needs plasmapheresis. EXAM: FLUOROSCOPIC AND ULTRASOUND GUIDED PLACEMENT OF A NON-TUNNELED DIALYSIS CATHETER Physician: Stephan Minister. Henn, MD MEDICATIONS: 1% lidocaine ANESTHESIA/SEDATION: Local anesthetic FLUOROSCOPY TIME:  Radiation Exposure Index (as provided by the fluoroscopic device): 9 mGy Kerma COMPLICATIONS: None immediate. PROCEDURE: Informed consent was obtained for catheter placement. The patient was placed supine on the interventional table. Both left and right internal jugular veins appear to be occluded with ultrasound. Left external jugular vein was patent. Ultrasound was used for the procedure but an image was not saved. The left neck was prepped and draped in a sterile fashion. The left neck was anesthetized with 1% lidocaine. Maximal barrier sterile technique was utilized including caps, mask, sterile gowns, sterile gloves, sterile drape, hand hygiene and skin antiseptic. A small incision was made with #11 blade scalpel. A 21 gauge needle directed into the left external jugular vein with ultrasound guidance. A micropuncture dilator set was placed. A 24 cm  Mahurkar catheter was selected. The catheter was advanced over a wire and positioned at the superior cavoatrial junction. Fluoroscopic images were obtained for documentation. Both dialysis lumens were found to aspirate and flush well. The proper amount of heparin was flushed in both lumens. The central venous lumen was flushed with normal saline. Catheter was sutured to skin. FINDINGS: Catheter tip at the superior cavoatrial junction. Left and right internal jugular veins appear to be occluded in the lower neck. Catheter was placed through the left external jugular vein. IMPRESSION: Successful placement of a left external jugular non-tunneled dialysis  catheter using ultrasound and fluoroscopic guidance. Electronically Signed   By: Markus Daft M.D.   On: 06/20/2021 20:51   DG OR UROLOGY CYSTO IMAGE ([facility] ONLY)  Result Date: 06/18/2021 There is no interpretation for this exam.  This order is for images obtained during a surgical procedure.  Please See "Surgeries" Tab for more information regarding the procedure.   CT HEMATURIA WORKUP  Result Date: 06/01/2021 CLINICAL DATA:  Gross hematuria in a 81 year old female. EXAM: CT ABDOMEN AND PELVIS WITHOUT AND WITH CONTRAST TECHNIQUE: Multidetector CT imaging of the abdomen and pelvis was performed following the standard protocol before and following the bolus administration of intravenous contrast. RADIATION DOSE REDUCTION: This exam was performed according to the departmental dose-optimization program which includes automated exposure control, adjustment of the mA and/or kV according to patient size and/or use of iterative reconstruction technique. CONTRAST:  127mL OMNIPAQUE IOHEXOL 300 MG/ML  SOLN COMPARISON:  Sep 24, 2020. FINDINGS: Lower chest: Incidental imaging of the lung bases without sign of effusion or consolidative change. 5 mm LEFT upper lobe pulmonary nodule (image 2/9) not imaged on previous imaging studies. No consolidation. No pleural effusion. Heart size unremarkable though incompletely imaged. Hepatobiliary: No focal, suspicious hepatic lesion. No pericholecystic stranding. No biliary duct dilation. Portal vein is patent. Pancreas: Normal, without mass, inflammation or ductal dilatation. Spleen: Normal. Adrenals/Urinary Tract: Adrenal glands are normal. Cortical scarring of the bilateral kidneys without signs of hydronephrosis or perinephric stranding. Tiny intrarenal calculus in the interpolar RIGHT kidney measures 2-3 mm. Bilateral renal cysts are noted not substantially changed from prior imaging. No abnormal enhancement along the course of the LEFT or RIGHT ureter but with a small distal  RIGHT ureteral calculus (image 64/7) this measures 3 mm. Subtle nodularity is present at the LEFT bladder base (image 72/7) 5 mm. Urinary bladder is collapsed which does limit assessment. Filling defect as well as enhancing area seen at the LEFT bladder base near the UVJ (image 68/13 in addition to the above referenced image. Stomach/Bowel: Normal appendix.  No acute gastrointestinal findings. Vascular/Lymphatic: Aortic atherosclerosis. No sign of aneurysm. Smooth contour of the IVC. There is no gastrohepatic or hepatoduodenal ligament lymphadenopathy. No retroperitoneal or mesenteric lymphadenopathy. No pelvic sidewall lymphadenopathy. Reproductive: Lobulated LEFT adnexal structure with low attenuation but with intermediate density, 39 Hounsfield units measuring 4.3 x 4.2 x 6.3 cm previously 4.8 x 4.6 x 6.0 cm. This does measure greatest craniocaudal dimension. RIGHT adnexa unremarkable by CT. Other: No ascites. Musculoskeletal: No acute bone finding. No destructive bone process. Spinal degenerative changes. Excretory phase: Filling defect of the LEFT posterolateral urinary bladder base near the UVJ as described no visible upper tract lesion. IMPRESSION: 1. Filling defect with enhancement in the LEFT posterolateral urinary bladder base near the LEFT UVJ suspicious for small urothelial neoplasm. Cystoscopy is advised. 2. 3 mm distal RIGHT ureteral calculus without current signs of obstruction. 3. Bilateral renal cysts. 4. 5  mm LEFT upper lobe pulmonary nodule not imaged on previous imaging studies. If bladder findings are compatible with neoplasm [employer] guidelines would not apply. Suggest close attention on follow-up, if bladder cancer is diagnosed 3 month follow-up of the chest may be warranted. 5. Lobulated LEFT adnexal structure with low attenuation but with intermediate density, 39 Hounsfield units. This does measure greatest craniocaudal dimension. This does measure 4.3 x 4.2 x 6.3 cm previously 4.8  x 4.6 x 6.0 cm. Given that this may represent a hemorrhagic cystic lesion of the adnexa in a postmenopausal patient greater than 6 cm would suggest follow-up pelvic sonogram if not yet performed. 6. Aortic atherosclerosis. Aortic Atherosclerosis (ICD10-I70.0). These results will be called to the ordering clinician or representative by the Radiologist Assistant, and communication documented in the PACS or [employer]. Electronically Signed   By: Zetta Bills M.D.   On: 06/01/2021 14:31   CT HEAD CODE STROKE WO CONTRAST  Result Date: 06/20/2021 CLINICAL DATA:  Code stroke. EXAM: CT HEAD WITHOUT CONTRAST TECHNIQUE: Contiguous axial images were obtained from the base of the skull through the vertex without intravenous contrast. RADIATION DOSE REDUCTION: This exam was performed according to the departmental dose-optimization program which includes automated exposure control, adjustment of the mA and/or kV according to patient size and/or use of iterative reconstruction technique. COMPARISON:  Prior CT from 11/02/2019. FINDINGS: Brain: Age-related cerebral atrophy with chronic small vessel ischemic disease. Small parenchymal calcific density at the posterior left frontal periventricular white matter, stable. No acute intracranial hemorrhage. No visible acute large vessel territory infarct. No mass lesion or midline shift. No hydrocephalus or extra-axial fluid collection. Vascular: No hyperdense vessel. Scattered vascular calcifications noted within the carotid siphons. Skull: Scalp soft tissues and calvarium within normal limits. Calvarium intact. Sinuses/Orbits: Globes and orbital soft tissues demonstrate no acute finding. Scattered mucosal thickening noted within the ethmoidal air cells. Paranasal sinuses are otherwise clear. Trace left mastoid effusion noted. Other: None. ASPECTS [facility] Stroke Program Early CT Score) - Ganglionic level infarction (caudate, lentiform nuclei, internal capsule, insula, M1-M3  cortex): 7 - Supraganglionic infarction (M4-M6 cortex): 3 Total score (0-10 with 10 being normal): 10 IMPRESSION: 1. No acute intracranial abnormality. 2. ASPECTS is 10. 3. 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 These results were communicated to the nurse practitioner taking care of the patient, Sharion Settler at 3:32 am on 06/20/2021 by text page via the [facility] messaging system. Electronically Signed   By: Jeannine Boga M.D.   On: 06/20/2021 03:35     Future Appointments  Date Time Provider [place]  06/29/2021 11:30 AM [facility] PHLEBOTOMIST [facility] None  06/29/2021 12:00 PM Ladell Pier, MD [facility] None      LOS: 2 days

## 2021-06-23 LAB — THERAPEUTIC PLASMA EXCHANGE (BLOOD BANK)
Plasma Exchange: 2200
Plasma volume needed: 2200
Unit division: 0
Unit division: 0
Unit division: 0
Unit division: 0
Unit division: 0
Unit division: 0
Unit division: 0

## 2021-06-23 LAB — COMPREHENSIVE METABOLIC PANEL
ALT: 26 U/L (ref 0–44)
AST: 34 U/L (ref 15–41)
Albumin: 2.9 g/dL — ABNORMAL LOW (ref 3.5–5.0)
Alkaline Phosphatase: 41 U/L (ref 38–126)
Anion gap: 9 (ref 5–15)
BUN: 47 mg/dL — ABNORMAL HIGH (ref 8–23)
CO2: 32 mmol/L (ref 22–32)
Calcium: 9.1 mg/dL (ref 8.9–10.3)
Chloride: 99 mmol/L (ref 98–111)
Creatinine, Ser: 1.64 mg/dL — ABNORMAL HIGH (ref 0.44–1.00)
GFR, Estimated: 32 mL/min — ABNORMAL LOW (ref >60)
Glucose, Bld: 149 mg/dL — ABNORMAL HIGH (ref 70–99)
Potassium: 3.9 mmol/L (ref 3.5–5.1)
Sodium: 140 mmol/L (ref 135–145)
Total Bilirubin: 0.5 mg/dL (ref 0.3–1.2)
Total Protein: 4.9 g/dL — ABNORMAL LOW (ref 6.5–8.1)

## 2021-06-23 LAB — CBC WITH DIFFERENTIAL/PLATELET
Abs Immature Granulocytes: 0.11 10*3/uL — ABNORMAL HIGH (ref 0.00–0.07)
Basophils Absolute: 0 10*3/uL (ref 0.0–0.1)
Basophils Relative: 0 %
Eosinophils Absolute: 0 10*3/uL (ref 0.0–0.5)
Eosinophils Relative: 0 %
HCT: 39.3 % (ref 36.0–46.0)
Hemoglobin: 13.7 g/dL (ref 12.0–15.0)
Immature Granulocytes: 1 %
Lymphocytes Relative: 5 %
Lymphs Abs: 0.4 10*3/uL — ABNORMAL LOW (ref 0.7–4.0)
MCH: 32.9 pg (ref 26.0–34.0)
MCHC: 34.9 g/dL (ref 30.0–36.0)
MCV: 94.2 fL (ref 80.0–100.0)
Monocytes Absolute: 0.3 10*3/uL (ref 0.1–1.0)
Monocytes Relative: 3 %
Neutro Abs: 9 10*3/uL — ABNORMAL HIGH (ref 1.7–7.7)
Neutrophils Relative %: 91 %
Platelets: 34 10*3/uL — ABNORMAL LOW (ref 150–400)
RBC: 4.17 MIL/uL (ref 3.87–5.11)
RDW: 12.6 % (ref 11.5–15.5)
WBC: 9.9 10*3/uL (ref 4.0–10.5)
nRBC: 0.2 % (ref 0.0–0.2)

## 2021-06-23 LAB — BASIC METABOLIC PANEL
Anion gap: 9 (ref 5–15)
BUN: 47 mg/dL — ABNORMAL HIGH (ref 8–23)
CO2: 30 mmol/L (ref 22–32)
Calcium: 8.8 mg/dL — ABNORMAL LOW (ref 8.9–10.3)
Chloride: 100 mmol/L (ref 98–111)
Creatinine, Ser: 1.64 mg/dL — ABNORMAL HIGH (ref 0.44–1.00)
GFR, Estimated: 32 mL/min — ABNORMAL LOW (ref >60)
Glucose, Bld: 265 mg/dL — ABNORMAL HIGH (ref 70–99)
Potassium: 4.2 mmol/L (ref 3.5–5.1)
Sodium: 139 mmol/L (ref 135–145)

## 2021-06-23 LAB — LACTATE DEHYDROGENASE: LDH: 223 U/L — ABNORMAL HIGH (ref 98–192)

## 2021-06-23 MED ORDER — DIPHENHYDRAMINE HCL 25 MG PO CAPS
ORAL_CAPSULE | ORAL | Status: AC
  Administered 2021-06-23: 25 mg via ORAL
  Filled 2021-06-23: qty 1

## 2021-06-23 MED ORDER — METHYLPREDNISOLONE SODIUM SUCC 125 MG IJ SOLR
80.0000 mg | Freq: Every day | INTRAMUSCULAR | Status: DC
  Administered 2021-06-23 – 2021-06-26 (×4): 80 mg via INTRAVENOUS
  Filled 2021-06-23 (×4): qty 2

## 2021-06-23 MED ORDER — CALCIUM CARBONATE ANTACID 500 MG PO CHEW
CHEWABLE_TABLET | ORAL | Status: AC
  Administered 2021-06-23: 400 mg via ORAL
  Filled 2021-06-23: qty 2

## 2021-06-23 MED ORDER — ACETAMINOPHEN 325 MG PO TABS: 650.0000 mg | ORAL_TABLET | ORAL | Status: DC | PRN

## 2021-06-23 MED ORDER — ACD FORMULA A 0.73-2.45-2.2 GM/100ML VI SOLN: 1000.0000 mL | Status: DC

## 2021-06-23 MED ORDER — CALCIUM GLUCONATE-NACL 2-0.675 GM/100ML-% IV SOLN
2.0000 g | Freq: Once | INTRAVENOUS | Status: AC
  Filled 2021-06-23: qty 100

## 2021-06-23 MED ORDER — CALCIUM CARBONATE ANTACID 500 MG PO CHEW
2.0000 | CHEWABLE_TABLET | ORAL | Status: DC
  Filled 2021-06-23: qty 2

## 2021-06-23 MED ORDER — DIPHENHYDRAMINE HCL 25 MG PO CAPS: 25.0000 mg | ORAL_CAPSULE | Freq: Four times a day (QID) | ORAL | Status: DC | PRN

## 2021-06-23 MED ORDER — ACD FORMULA A 0.73-2.45-2.2 GM/100ML VI SOLN
1000.0000 mL | Status: DC
  Filled 2021-06-23: qty 1000

## 2021-06-23 MED ORDER — ACD FORMULA A 0.73-2.45-2.2 GM/100ML VI SOLN
Status: AC
  Administered 2021-06-23: 1000 mL
  Filled 2021-06-23: qty 1000

## 2021-06-23 MED ORDER — CALCIUM GLUCONATE-NACL 2-0.675 GM/100ML-% IV SOLN
INTRAVENOUS | Status: AC
  Administered 2021-06-23: 2000 mg via INTRAVENOUS
  Filled 2021-06-23: qty 100

## 2021-06-23 MED ORDER — ACETAMINOPHEN 325 MG PO TABS
ORAL_TABLET | ORAL | Status: AC
  Administered 2021-06-23: 650 mg via ORAL
  Filled 2021-06-23: qty 2

## 2021-06-23 MED ORDER — ANTICOAGULANT SODIUM CITRATE 4% (200MG/5ML) IV SOLN: 5.0000 mL | Freq: Once | Status: DC

## 2021-06-23 MED ORDER — CALCIUM GLUCONATE-NACL 2-0.675 GM/100ML-% IV SOLN: 2.0000 g | Freq: Once | INTRAVENOUS | Status: DC

## 2021-06-23 MED ORDER — ANTICOAGULANT SODIUM CITRATE 4% (200MG/5ML) IV SOLN
5.0000 mL | Freq: Once | Status: AC
  Administered 2021-06-23: 5 mL
  Filled 2021-06-23: qty 5

## 2021-06-23 NOTE — Hospital Course (Addendum)
81yo female with history of DVT/PE, TTP, COPD, tobacco use, and depression who underwent right ureteroscopy with laser lithotripsy of distal ureteral stone, right ureteroscopy and laser lithotripsy of renal stone, right ureteral stent placement, and bladder biopsy and fulguration with instillation of gemcitabine 06/18/21.  After returning home her stent became partially dislodged during a coughing spell, so she and her daughter completely removed it. She subsequently reported worsening right-sided flank pain.  She presented to the [facility] ED where she was found to have pyelonephritis and started on IV Rocephin.   Overnight 2/18 > 2/19 while at [facility] the patient developed left-sided weakness and a code stroke was called.  Her symptoms soon after started improving, though not fully resolving. CT head/CTa head did not note any acute findings or large vessel occlusions. MRI was consistent with small multiple infarcts in B hemispheres. Her platelets trended down abruptly from 89K > 24K > 16K. It was suspected that she was in a TTP flare therefore arrangements were made to transfer her to [facility] for plasmapheresis.  Day 6/7 (2/24)

## 2021-06-23 NOTE — Assessment & Plan Note (Signed)
CTA neck unremarkable for any large vessel occlusion- MRInoted multiple Bacute small multifocal infarcts - felt to be due to TTP - avoid ASA/Plavix due to TTP - PT/OT/SLP

## 2021-06-23 NOTE — Evaluation (Signed)
Occupational Therapy Evaluation Patient Details Name: [name] MRN: [id number] DOB: 1941-02-11 Today's Date: 06/23/2021   History of Present Illness 81 yo female underwent R uretoscopy with laser lithotripsy on distal ureteral stone and R ureteral stent placement 2/17. pt d/c home and dislodged stent R flank pain admitted to [facility] then moved to [facility] MRI small multiple B hemisphere platelet trended down 16K ( suspected TTp flare) PMH COPD TTP depression tobacco use   Clinical Impression   PT admitted with TTP flare. Pt currently with functional limitiations due to the deficits listed below (see OT problem list). Pt currently fatigued and sleepy.  Pt will benefit from skilled OT to increase their independence and safety with adls and balance to allow discharge home with family.       Recommendations for follow up therapy are one component of a multi-disciplinary discharge planning process, led by the attending physician.  Recommendations may be updated based on patient status, additional functional criteria and insurance authorization.   Follow Up Recommendations  No OT follow up    Assistance Recommended at Discharge None  Patient can return home with the following      Functional Status Assessment  Patient has had a recent decline in their functional status and demonstrates the ability to make significant improvements in function in a reasonable and predictable amount of time.  Equipment Recommendations  None recommended by OT    Recommendations for Other Services       Precautions / Restrictions Precautions Precautions: None      Mobility Bed Mobility Overal bed mobility: Modified Independent                  Transfers Overall transfer level: Modified independent                        Balance                                           ADL either performed or assessed with clinical judgement   ADL Overall ADL's : Needs  assistance/impaired Eating/Feeding: Independent   Grooming: Modified independent;Sitting   Upper Body Bathing: Min guard;Sitting   Lower Body Bathing: Min guard;Sit to/from stand   Upper Body Dressing : Min guard;Sitting       Toilet Transfer: Min Dispensing optician Details (indicate cue type and reason): spouse reports just assisting patient to bsc and noted still some blood in urine           General ADL Comments: pt fatigued but agreeable to OT evaluation. pt with light turned off after session and warm blanket applied.     Vision Baseline Vision/History: 1 Wears glasses Ability to See in Adequate Light: 0 Adequate       Perception     Praxis      Pertinent Vitals/Pain Pain Assessment Pain Assessment: No/denies pain     Hand Dominance Right   Extremity/Trunk Assessment Upper Extremity Assessment Upper Extremity Assessment: Overall WFL for tasks assessed   Lower Extremity Assessment Lower Extremity Assessment: Defer to PT evaluation   Cervical / Trunk Assessment Cervical / Trunk Assessment: Normal   Communication Communication Communication: No difficulties   Cognition Arousal/Alertness: Awake/alert Behavior During Therapy: WFL for tasks assessed/performed Overall Cognitive Status: Within Functional Limits for tasks assessed  General Comments  VSS    Exercises     Shoulder Instructions      Home Living Family/patient expects to be discharged to:: Private residence Living Arrangements: Spouse/significant other Available Help at Discharge: Family Type of Home: House Home Access: Ramped entrance     Home Layout: Two level;1/2 bath on main level;Bed/bath upstairs Alternate Level Stairs-Number of Steps: 15 Alternate Level Stairs-Rails: Right;Left Bathroom Shower/Tub: Teacher, early years/pre: Standard     Home Equipment: Conservation officer, nature (2 wheels);Cane - single  point;Wheelchair - manual          Prior Functioning/Environment Prior Level of Function : Independent/Modified Independent                        OT Problem List: Impaired balance (sitting and/or standing)      OT Treatment/Interventions: Self-care/ADL training;Energy conservation;Therapeutic activities;Patient/family education    OT Goals(Current goals can be found in the care plan section) Acute Rehab OT Goals Patient Stated Goal: to return home OT Goal Formulation: With patient Time For Goal Achievement: 07/07/21 Potential to Achieve Goals: Good  OT Frequency: Min 2X/week    Co-evaluation              AM-PAC OT "6 Clicks" Daily Activity     Outcome Measure Help from another person eating meals?: None Help from another person taking care of personal grooming?: None Help from another person toileting, which includes using toliet, bedpan, or urinal?: None Help from another person bathing (including washing, rinsing, drying)?: A Little Help from another person to put on and taking off regular upper body clothing?: None Help from another person to put on and taking off regular lower body clothing?: A Little 6 Click Score: 22   End of Session Equipment Utilized During Treatment: Oxygen (88% RA placed back on [place]) Nurse Communication: Mobility status;Precautions  Activity Tolerance: Patient tolerated treatment well Patient left: in bed;with call bell/phone within reach;with bed alarm set;with family/visitor present  OT Visit Diagnosis: Unsteadiness on feet (R26.81);Muscle weakness (generalized) (M62.81)                Time: 1749-4496 OT Time Calculation (min): 16 min Charges:  OT General Charges $OT Visit: 1 Visit OT Evaluation $OT Eval Moderate Complexity: 1 Mod   Brynn, OTR/L  Acute Rehabilitation Services Pager: [phone] Office: [phone] .   Jeri Modena 06/23/2021, 3:53 PM

## 2021-06-23 NOTE — Progress Notes (Addendum)
°  Progress Note   Patient: [name] WVP:[id number] DOB: 05/13/1940 DOA: 06/20/2021     3 DOS: the patient was seen and examined on 06/23/2021   Brief hospital course: 81yo female with history of DVT/PE, TTP, COPD, tobacco use, and depression who underwent right ureteroscopy with laser lithotripsy of distal ureteral stone, right ureteroscopy and laser lithotripsy of renal stone, right ureteral stent placement, and bladder biopsy and fulguration with instillation of gemcitabine 06/18/21.  After returning home her stent became partially dislodged during a coughing spell, so she and her daughter completely removed it. She subsequently reported worsening right-sided flank pain.  She presented to the [facility] ED where she was found to have pyelonephritis and started on IV Rocephin.   Overnight 2/18 > 2/19 while at [facility] the patient developed left-sided weakness and a code stroke was called.  Her symptoms soon after started improving, though not fully resolving. CT head/CTa head did not note any acute findings or large vessel occlusions. MRI was consistent with small multiple infarcts in B hemispheres. Her platelets trended down abruptly from 89K > 24K > 16K. It was suspected that she was in a TTP flare therefore arrangements were made to transfer her to [facility] for plasmapheresis.  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) Continue plasmapheresis under direction of Hematology -plan is for 7 days    AKI (acute kidney injury) ([place])- (present on admission) Acute kidney failure on CKD stage IIIa Baseline creatinine dating to January of this year approximately 1.3 with GFR 40-48 -acute worsening felt to be combination of pyelonephritis, stent removal, and acute critical illness with TTP -monitor trend  -renal U/S unremarkable  Acute left-sided weakness CTA neck unremarkable for any large vessel occlusion - MRI noted multiple B acute small  multifocal infarcts - felt to be due to TTP - avoid ASA/Plavix due to TTP - PT/OT/SLP  Nicotine dependence- (present on admission) -patch       Subjective: await today's plasmapheresis  Physical Exam: Vitals:   06/22/21 2318 06/23/21 0341 06/23/21 0739 06/23/21 1104  BP: (!) 114/48 (!) 139/57 121/82 (!) 131/57  Pulse: 76 72 74 85  Resp: 20 20 19  (!) 22  Temp: 98.7 F (37.1 C) 98.1 F (36.7 C) 97.8 F (36.6 C) 97.9 F (36.6 C)  TempSrc: Oral Oral Oral Oral  SpO2: 95% 95% 94% 92%  Weight:  81.8 kg    Height:       Sleeping    Data Reviewed: Plts 34, CR down to 1.64    Disposition: Status is: Inpatient Remains inpatient appropriate because: needs 6 more days of plasmapheresis           Planned Discharge Destination: Home     Time spent: 35 minutes  Author: Geradine Girt, DO 06/23/2021 1:01 PM  For on call review [email or web address].

## 2021-06-23 NOTE — Assessment & Plan Note (Addendum)
Continue plasmapheresis under direction of Hematology -plan is for 7 days -can get again on Monday

## 2021-06-23 NOTE — Progress Notes (Signed)
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, steroids, rituximab Relapse of TTP March 2015-plasma exchange, steroids, rituximab, and maintenance azathioprine Admission with severe thrombocytopenia and elevated LDH 07/16/2020, ADAMTS13 activity less than 2%, consistent with relapse of TTP       -Steroids/FFP given 07/16/2020       -Daily plasma exchange beginning 07/17/2020, 7 days, then qod starting 3/24, discharge 07/21/2020 on prednisone 60 mg daily       -Prednisone taper to 40 mg daily 07/29/2020       -rituximab 07/30/2020, 08/06/2020, 08/13/2020, 08/20/2020       -plasmapheresis on 07/31/2020, 08/03/2020, 08/05/2020, and 08/07/2020       -prednisone increased to 60 mg daily 08/17/2020       -Plasmapheresis 08/24/2020, 08/25/2020, 08/26/2020, 08/27/2020, 08/28/2020, 08/29/2020, 08/31/2020, 09/02/2020, 09/04/2020       -caplacizumab initiated 08/24/2020       -Decrease prednisone to 40 mg daily 08/28/2020       -ADAMTS13 37% on 09/04/2020       -Prednisone decreased to 30 mg daily 09/07/2020       -Continue caplacizumab       -prednisone decreased to 20 mg daily beginning 09/18/2020       -Cablivi discontinued 09/21/2020       -prednisone discontinued 09/24/2020, resumed at a dose of 20 mg daily on 10/01/2020       -prednisone taper to 10 mg daily 10/23/2020       -Prednisone taper to 5 mg daily 11/06/2020       -prednisone continued at a dose of 5 mg daily, Imuran resumed at 50 mg daily 12/03/2020       - admission has 06/19/2021 with relapse of TTP, daily plasma exchange and Solu-Medrol beginning 06/20/2021, 1 dose of caplacizumab 06/20/2021  History of epidural abscess requiring laminectomy 2012 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 Cristina Gong- no active bleeding or blood in the stomach, nor any prospective bleeding site, seen on exam. 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  The platelet count is stable today.  She has completed 3 days of plasma exchange and continue Solu-Medrol.  The creatinine is better today.  An ultrasound yesterday revealed no hydronephrosis.  Her neurologic status appears to have returned to baseline.  Recommendations: 1.  Continue daily plasmapheresis. 2.  Follow-up on ADAMTS 13 result, though her level has been chronically low 3.  Continue solumedrol, will reduce to 80 mg daily 4.  No plans for caplacizumab given history of GI bleed while on this medication. 5.  Outpatient f/u with Urology for the bladder cancer 6.  Daily CBC, chemistry panel, and LDH from pheresis catheter 7.  Hematology will continue following her in the hospital and outpatient follow-up will be scheduled at the Cancer center. 8. PT/OT evaluation   SUBJECTIVE: [name] completed day 3 of plasmapheresis yesterday.  She reports tolerated procedure well.  No symptoms of hypocalcemia.  No bleeding.  She reports becoming angry with the nursing staff last night.  She relates this  to steroid therapy.  PHYSICAL EXAMINATION:  Vitals:   06/22/21 2318 06/23/21 0341  BP: (!) 114/48 (!) 139/57  Pulse: 76 72  Resp: 20 20  Temp: 98.7 F (37.1 C) 98.1 F (36.7 C)  SpO2: 95% 95%   Filed Weights   06/22/21 0500 06/22/21 0926 06/23/21 0341  Weight: 186 lb 1.1 oz (84.4 kg) 180 lb 5.4 oz (81.8 kg) 180 lb 5.4 oz (81.8 kg)    Intake/Output from previous day: 02/21 0701 - 02/22  0700 In: 2120 [P.O.:720; IV Piggyback:1400] Out: 525 [Urine:525]  HEENT: Mild thrush at the left buccal mucosa Abdomen: No hepatosplenomegaly, nontender Vascular: No leg edema Neurologic: Alert, follows commands, moves all extremities to command, the left arm and hand strength appears normal, the speech is fluent Skin: Multiple small ecchymoses over the extremities   LABORATORY DATA:  I have reviewed the data as listed CMP Latest Ref Rng & Units 06/23/2021 06/22/2021 06/21/2021  Glucose 70 - 99 mg/dL 149(H) 161(H) 144(H)  BUN 8 - 23 mg/dL 47(H) 52(H) 39(H)  Creatinine 0.44 - 1.00 mg/dL 1.64(H) 2.24(H) 2.21(H)  Sodium 135 - 145 mmol/L 140 137 138  Potassium 3.5 - 5.1 mmol/L 3.9 4.1 3.9  Chloride 98 - 111 mmol/L 99 98 99  CO2 22 - 32 mmol/L 32 28 28  Calcium 8.9 - 10.3 mg/dL 9.1 9.7 9.6  Total Protein 6.5 - 8.1 g/dL 4.9(L) 5.3(L) 5.9(L)  Total Bilirubin 0.3 - 1.2 mg/dL 0.5 1.0 0.9  Alkaline Phos 38 - 126 U/L 41 35(L) 38  AST 15 - 41 U/L 34 33 36  ALT 0 - 44 U/L 26 16 21     Lab Results  Component Value Date   WBC 9.9 06/23/2021   HGB 13.7 06/23/2021   HCT 39.3 06/23/2021   MCV 94.2 06/23/2021   PLT 34 (L) 06/23/2021   NEUTROABS 9.0 (H) 06/23/2021   I reviewed the peripheral blood smear.  The platelets are markedly decreased in number.  No platelet clumps..  Rare helmet and schistocytes.  No nucleated red cells.  The polychromasia is not increased.  The majority the white cells are mature neutrophils.  No blasts or other young forms are seen. No results found for: CEA1, CEA, [id number], CA125, PSA1  CT ABDOMEN PELVIS WO CONTRAST  Result Date: 06/19/2021 CLINICAL DATA:  Flank pain, kidney stone suspected EXAM: CT ABDOMEN AND PELVIS WITHOUT CONTRAST TECHNIQUE: Multidetector CT imaging of the abdomen and pelvis was performed following the standard protocol without IV contrast. RADIATION DOSE REDUCTION: This exam was performed according to the departmental dose-optimization program which  includes automated exposure control, adjustment of the mA and/or kV according to patient size and/or use of iterative reconstruction technique. COMPARISON:  CT 05/31/2021 FINDINGS: Lower chest: Bibasilar atelectasis. There are 5 mm nodules in the peripheral lung bases (series 3, image 4). Hepatobiliary: No focal liver abnormality is seen. Possible tiny hyperdense stones in the gallbladder fundus. Gallbladder is nondilated. Pancreas: Unremarkable. No pancreatic ductal dilatation or surrounding inflammatory changes. Spleen: Normal in size without focal abnormality. Adrenals/Urinary Tract: Adrenal glands are unremarkable. Punctate nonobstructive left lower pole renal stone. No left-sided hydronephrosis. Unchanged left lower pole cyst. Right-sided hydroureteronephrosis with perinephric and periureteral stranding. There is a small focus of gas within the right renal pelvis (series 2, image 30) likely related to recent retrograde pyelogram. Unchanged right renal cysts. There is no visible obstructing stone in the ureter. The previously seen 3 mm stone in the distal right ureter is no longer visible distant  with recent ureteroscopy and lithotripsy. There is no stent in the ureter present. There is gas in the bladder consistent with recent procedure. Stomach/Bowel: Small hiatal hernia. The stomach is otherwise within normal limits. There is no evidence of bowel obstruction. The appendix is normal. There are few sigmoid diverticula. Vascular/Lymphatic: Aortoiliac atherosclerotic calcifications. No AAA. No lymphadenopathy. Reproductive: There is a 4.9 x 4.0 x 6.0 cm left adnexal structure with intermediate density, unchanged from prior exam. Other: No bowel containing hernia. No abdominopelvic ascites. No free air. Musculoskeletal: Unchanged chronic T12 compression deformity with 25% height loss anteriorly. Multilevel degenerative changes of the spine with severe degenerative disc disease and facet arthropathy in the lumbar  spine. IMPRESSION: Right-sided hydroureteronephrosis with perinephric and perinephric inflammatory stranding. No obstructing stone is visible within the ureter. Small focus of gas within the right renal pelvis, likely related to recent retrograde pyelogram. There is no stent in the ureter. Recommend urology consultation. Unchanged intermediate density left adnexal lesion measuring up to 6.0 cm, which could be a hemorrhagic cyst. Given the patient is postmenopausal, nonemergent pelvic ultrasound is recommended for further evaluation if not yet performed. Two incidental 5 mm pulmonary nodules in the peripheral lung bases. No follow-up needed if patient is low-risk (and has no known or suspected primary neoplasm). Non-contrast chest CT can be considered in 12 months if patient is high-risk. This recommendation follows the consensus statement: Guidelines for Management of Incidental Pulmonary Nodules Detected on CT Images: From the Fleischner Society 2017; Radiology 2017; 284:228-243. Electronically Signed   By: Maurine Simmering M.D.   On: 06/19/2021 09:45   CT ANGIO HEAD NECK W WO CM  Result Date: 06/20/2021 CLINICAL DATA:  81 year old female code stroke presentation. Left side weakness. EXAM: CT ANGIOGRAPHY HEAD AND NECK TECHNIQUE: Multidetector CT imaging of the head and neck was performed using the standard protocol during bolus administration of intravenous contrast. Multiplanar CT image reconstructions and MIPs were obtained to evaluate the vascular anatomy. Carotid stenosis measurements (when applicable) are obtained utilizing NASCET criteria, using the distal internal carotid diameter as the denominator. RADIATION DOSE REDUCTION: This exam was performed according to the departmental dose-optimization program which includes automated exposure control, adjustment of the mA and/or kV according to patient size and/or use of iterative reconstruction technique. CONTRAST:  12mL OMNIPAQUE IOHEXOL 350 MG/ML SOLN  COMPARISON:  Plain head CT 0318 hours. FINDINGS: CTA NECK Skeleton: Osteopenia. Chronic lower cervical spine disc and endplate degeneration. Multilevel mild upper thoracic compression fractures, such as T3 and T4, appears stable from a 2021 cervical spine CT. No acute osseous abnormality identified. Upper chest: Mild dependent atelectasis. Mild retained secretions in the proximal thoracic esophagus which is nondilated. No visible mediastinal lymphadenopathy. Visible central pulmonary arteries appear patent. Other neck: Right supraclavicular venous collaterals appear incidental. Retropharyngeal course of both carotids (normal variant). No acute neck soft tissue finding. Aortic arch: Mild Calcified aortic atherosclerosis. Three vessel arch configuration. Right carotid system: Mild brachiocephalic artery calcified plaque without stenosis. Normal right CCA origin. Tortuous right CCA with a retropharyngeal carotid bifurcation. Mild right ICA origin and bulb calcified plaque without stenosis. Left carotid system: Negative left CCA origin. Tortuous left CCA with a retropharyngeal course similar to the right. Mild calcified plaque at the left ICA origin and bulb without stenosis. Vertebral arteries: Mild calcified plaque in the proximal right subclavian artery without stenosis. Minimal calcified plaque at the right vertebral artery origin without stenosis. Right vertebral artery appears mildly non dominant and patent to the skull base without  stenosis. Mild proximal left subclavian artery calcified plaque without stenosis. Normal left vertebral artery origin. Dominant left vertebral artery is tortuous in the V2 segment and patent to the skull base without plaque or stenosis. CTA HEAD Posterior circulation: Normal distal vertebral arteries and vertebrobasilar junction. Dominant left V4 segment. Normal PICA origins. Patent basilar artery without stenosis. Normal SCA and left PCA origin. Fetal type right PCA origin. Left  posterior communicating artery diminutive or absent. Bilateral PCA branches are within normal limits. Anterior circulation: Both ICA siphons are patent and tortuous. Mild to moderate calcified plaque on the left but only mild left siphon stenosis. Similar calcified plaque on the right with no significant stenosis. Normal right posterior communicating artery origin. Small infundibulum of the distal left ICA (normal variant). Patent carotid termini. Normal MCA and ACA origins. Normal anterior communicating artery. Bilateral ACA branches are within normal limits. Left MCA M1 segment and bifurcation are patent without stenosis. Right MCA M1 segment and bifurcation are patent without stenosis. Bilateral MCA branches are within normal limits. Venous sinuses: Patent. Anatomic variants: Dominant left vertebral artery. Fetal type right PCA origin. Review of the MIP images confirms the above findings IMPRESSION: 1. Negative for large vessel occlusion. And generally mild for age atherosclerosis in the head and neck, most pronounced at the ICA siphons. No significant arterial stenosis. 2. Aortic Atherosclerosis (ICD10-I70.0). 3. Osteopenia with multiple mild chronic upper thoracic compression fractures. Electronically Signed   By: Genevie Ann M.D.   On: 06/20/2021 04:16   MR BRAIN WO CONTRAST  Result Date: 06/20/2021 CLINICAL DATA:  81 year old female code stroke presentation. Left side weakness. EXAM: MRI HEAD WITHOUT CONTRAST TECHNIQUE: Multiplanar, multiecho pulse sequences of the brain and surrounding structures were obtained without intravenous contrast. COMPARISON:  Brain MRI 09/14/2012. FINDINGS: The examination had to be discontinued prior to completion due to patient back pain. Only DWI, axial T2* and FLAIR imaging was obtained. Mildly motion degraded axial and coronal DWI imaging demonstrates multifocal, widely scattered, generally small foci of restricted diffusion in the bilateral cerebral and cerebellar  hemispheres. Most confluent area is in the right centrum semiovale near the pre motor area on series 6, image 40. Essentially all vascular territories affected. Deep gray matter nuclei and brainstem are spared. No intracranial mass effect. Stable ventricle size and configuration. Basilar cisterns appear normal. Patchy additional bilateral cerebral white matter and central pontine FLAIR heterogeneity appears increased since 2014. No evidence of acute or chronic intracranial hemorrhage. IMPRESSION: 1. Small acute infarcts - most punctate - scattered throughout the bilateral cerebral and cerebellar hemispheres in keeping with a recent Embolic event from the heart or proximal aorta. Largest area of involvement is in the right frontal lobe pre-motor white matter area. No associated mass effect or hemorrhage. 2. Truncated exam, only three imaging sequences obtained. Electronically Signed   By: Genevie Ann M.D.   On: 06/20/2021 08:30   US RENAL  Result Date: 06/22/2021 CLINICAL DATA:  Renal dysfunction EXAM: RENAL / URINARY TRACT ULTRASOUND COMPLETE COMPARISON:  CT done on 06/19/2021 FINDINGS: Right Kidney: Renal measurements: 9.7 x 5.1 x 4.5 cm = volume: 117.7 mL. There is no hydronephrosis. There is interval resolution of right hydronephrosis. There is 2.4 cm cyst in the upper pole. There is slightly increased cortical echogenicity. There is no perinephric fluid collection. Left Kidney: Renal measurements: 9.8 x 5.1 x 4.9 cm = volume: 126.9 mL. There is no hydronephrosis. There is slightly increased cortical echogenicity. Bladder: Layering low-level echoes are noted in the dependent  portion of bladder lumen. Other: None. IMPRESSION: There is no hydronephrosis. Right renal cyst. Increased cortical echogenicity may suggest medical renal disease. Small amount of debris is seen in the dependent portion of urinary bladder lumen, possibly blood products. Electronically Signed   By: Elmer Picker M.D.   On: 06/22/2021  17:02   IR Fluoro Guide CV Line Left  Result Date: 06/20/2021 INDICATION: 81 year old with TTP.  Patient needs plasmapheresis. EXAM: FLUOROSCOPIC AND ULTRASOUND GUIDED PLACEMENT OF A NON-TUNNELED DIALYSIS CATHETER Physician: Stephan Minister. Henn, MD MEDICATIONS: 1% lidocaine ANESTHESIA/SEDATION: Local anesthetic FLUOROSCOPY TIME:  Radiation Exposure Index (as provided by the fluoroscopic device): 9 mGy Kerma COMPLICATIONS: None immediate. PROCEDURE: Informed consent was obtained for catheter placement. The patient was placed supine on the interventional table. Both left and right internal jugular veins appear to be occluded with ultrasound. Left external jugular vein was patent. Ultrasound was used for the procedure but an image was not saved. The left neck was prepped and draped in a sterile fashion. The left neck was anesthetized with 1% lidocaine. Maximal barrier sterile technique was utilized including caps, mask, sterile gowns, sterile gloves, sterile drape, hand hygiene and skin antiseptic. A small incision was made with #11 blade scalpel. A 21 gauge needle directed into the left external jugular vein with ultrasound guidance. A micropuncture dilator set was placed. A 24 cm Mahurkar catheter was selected. The catheter was advanced over a wire and positioned at the superior cavoatrial junction. Fluoroscopic images were obtained for documentation. Both dialysis lumens were found to aspirate and flush well. The proper amount of heparin was flushed in both lumens. The central venous lumen was flushed with normal saline. Catheter was sutured to skin. FINDINGS: Catheter tip at the superior cavoatrial junction. Left and right internal jugular veins appear to be occluded in the lower neck. Catheter was placed through the left external jugular vein. IMPRESSION: Successful placement of a left external jugular non-tunneled dialysis catheter using ultrasound and fluoroscopic guidance. Electronically Signed   By: Markus Daft  M.D.   On: 06/20/2021 20:51   IR US Guide Vasc Access Left  Result Date: 06/20/2021 INDICATION: 81 year old with TTP.  Patient needs plasmapheresis. EXAM: FLUOROSCOPIC AND ULTRASOUND GUIDED PLACEMENT OF A NON-TUNNELED DIALYSIS CATHETER Physician: Stephan Minister. Henn, MD MEDICATIONS: 1% lidocaine ANESTHESIA/SEDATION: Local anesthetic FLUOROSCOPY TIME:  Radiation Exposure Index (as provided by the fluoroscopic device): 9 mGy Kerma COMPLICATIONS: None immediate. PROCEDURE: Informed consent was obtained for catheter placement. The patient was placed supine on the interventional table. Both left and right internal jugular veins appear to be occluded with ultrasound. Left external jugular vein was patent. Ultrasound was used for the procedure but an image was not saved. The left neck was prepped and draped in a sterile fashion. The left neck was anesthetized with 1% lidocaine. Maximal barrier sterile technique was utilized including caps, mask, sterile gowns, sterile gloves, sterile drape, hand hygiene and skin antiseptic. A small incision was made with #11 blade scalpel. A 21 gauge needle directed into the left external jugular vein with ultrasound guidance. A micropuncture dilator set was placed. A 24 cm Mahurkar catheter was selected. The catheter was advanced over a wire and positioned at the superior cavoatrial junction. Fluoroscopic images were obtained for documentation. Both dialysis lumens were found to aspirate and flush well. The proper amount of heparin was flushed in both lumens. The central venous lumen was flushed with normal saline. Catheter was sutured to skin. FINDINGS: Catheter tip at the superior cavoatrial junction. Left and  right internal jugular veins appear to be occluded in the lower neck. Catheter was placed through the left external jugular vein. IMPRESSION: Successful placement of a left external jugular non-tunneled dialysis catheter using ultrasound and fluoroscopic guidance. Electronically  Signed   By: Markus Daft M.D.   On: 06/20/2021 20:51   DG OR UROLOGY CYSTO IMAGE ([facility] ONLY)  Result Date: 06/18/2021 There is no interpretation for this exam.  This order is for images obtained during a surgical procedure.  Please See "Surgeries" Tab for more information regarding the procedure.   CT HEMATURIA WORKUP  Result Date: 06/01/2021 CLINICAL DATA:  Gross hematuria in a 81 year old female. EXAM: CT ABDOMEN AND PELVIS WITHOUT AND WITH CONTRAST TECHNIQUE: Multidetector CT imaging of the abdomen and pelvis was performed following the standard protocol before and following the bolus administration of intravenous contrast. RADIATION DOSE REDUCTION: This exam was performed according to the departmental dose-optimization program which includes automated exposure control, adjustment of the mA and/or kV according to patient size and/or use of iterative reconstruction technique. CONTRAST:  168mL OMNIPAQUE IOHEXOL 300 MG/ML  SOLN COMPARISON:  Sep 24, 2020. FINDINGS: Lower chest: Incidental imaging of the lung bases without sign of effusion or consolidative change. 5 mm LEFT upper lobe pulmonary nodule (image 2/9) not imaged on previous imaging studies. No consolidation. No pleural effusion. Heart size unremarkable though incompletely imaged. Hepatobiliary: No focal, suspicious hepatic lesion. No pericholecystic stranding. No biliary duct dilation. Portal vein is patent. Pancreas: Normal, without mass, inflammation or ductal dilatation. Spleen: Normal. Adrenals/Urinary Tract: Adrenal glands are normal. Cortical scarring of the bilateral kidneys without signs of hydronephrosis or perinephric stranding. Tiny intrarenal calculus in the interpolar RIGHT kidney measures 2-3 mm. Bilateral renal cysts are noted not substantially changed from prior imaging. No abnormal enhancement along the course of the LEFT or RIGHT ureter but with a small distal RIGHT ureteral calculus (image 64/7) this measures 3 mm. Subtle  nodularity is present at the LEFT bladder base (image 72/7) 5 mm. Urinary bladder is collapsed which does limit assessment. Filling defect as well as enhancing area seen at the LEFT bladder base near the UVJ (image 68/13 in addition to the above referenced image. Stomach/Bowel: Normal appendix.  No acute gastrointestinal findings. Vascular/Lymphatic: Aortic atherosclerosis. No sign of aneurysm. Smooth contour of the IVC. There is no gastrohepatic or hepatoduodenal ligament lymphadenopathy. No retroperitoneal or mesenteric lymphadenopathy. No pelvic sidewall lymphadenopathy. Reproductive: Lobulated LEFT adnexal structure with low attenuation but with intermediate density, 39 Hounsfield units measuring 4.3 x 4.2 x 6.3 cm previously 4.8 x 4.6 x 6.0 cm. This does measure greatest craniocaudal dimension. RIGHT adnexa unremarkable by CT. Other: No ascites. Musculoskeletal: No acute bone finding. No destructive bone process. Spinal degenerative changes. Excretory phase: Filling defect of the LEFT posterolateral urinary bladder base near the UVJ as described no visible upper tract lesion. IMPRESSION: 1. Filling defect with enhancement in the LEFT posterolateral urinary bladder base near the LEFT UVJ suspicious for small urothelial neoplasm. Cystoscopy is advised. 2. 3 mm distal RIGHT ureteral calculus without current signs of obstruction. 3. Bilateral renal cysts. 4. 5 mm LEFT upper lobe pulmonary nodule not imaged on previous imaging studies. If bladder findings are compatible with neoplasm [employer] guidelines would not apply. Suggest close attention on follow-up, if bladder cancer is diagnosed 3 month follow-up of the chest may be warranted. 5. Lobulated LEFT adnexal structure with low attenuation but with intermediate density, 39 Hounsfield units. This does measure greatest craniocaudal dimension. This does measure  4.3 x 4.2 x 6.3 cm previously 4.8 x 4.6 x 6.0 cm. Given that this may represent a hemorrhagic  cystic lesion of the adnexa in a postmenopausal patient greater than 6 cm would suggest follow-up pelvic sonogram if not yet performed. 6. Aortic atherosclerosis. Aortic Atherosclerosis (ICD10-I70.0). These results will be called to the ordering clinician or representative by the Radiologist Assistant, and communication documented in the PACS or [employer]. Electronically Signed   By: Zetta Bills M.D.   On: 06/01/2021 14:31   CT HEAD CODE STROKE WO CONTRAST  Result Date: 06/20/2021 CLINICAL DATA:  Code stroke. EXAM: CT HEAD WITHOUT CONTRAST TECHNIQUE: Contiguous axial images were obtained from the base of the skull through the vertex without intravenous contrast. RADIATION DOSE REDUCTION: This exam was performed according to the departmental dose-optimization program which includes automated exposure control, adjustment of the mA and/or kV according to patient size and/or use of iterative reconstruction technique. COMPARISON:  Prior CT from 11/02/2019. FINDINGS: Brain: Age-related cerebral atrophy with chronic small vessel ischemic disease. Small parenchymal calcific density at the posterior left frontal periventricular white matter, stable. No acute intracranial hemorrhage. No visible acute large vessel territory infarct. No mass lesion or midline shift. No hydrocephalus or extra-axial fluid collection. Vascular: No hyperdense vessel. Scattered vascular calcifications noted within the carotid siphons. Skull: Scalp soft tissues and calvarium within normal limits. Calvarium intact. Sinuses/Orbits: Globes and orbital soft tissues demonstrate no acute finding. Scattered mucosal thickening noted within the ethmoidal air cells. Paranasal sinuses are otherwise clear. Trace left mastoid effusion noted. Other: None. ASPECTS [facility] Fall Stroke Program Early CT Score) - Ganglionic level infarction (caudate, lentiform nuclei, internal capsule, insula, M1-M3 cortex): 7 - Supraganglionic infarction (M4-M6 cortex): 3  Total score (0-10 with 10 being normal): 10 IMPRESSION: 1. No acute intracranial abnormality. 2. ASPECTS is 10. 3. 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 These results were communicated to the nurse practitioner taking care of the patient, Sharion Settler at 3:32 am on 06/20/2021 by text page via the [facility] messaging system. Electronically Signed   By: Jeannine Boga M.D.   On: 06/20/2021 03:35     Future Appointments  Date Time Provider [place]  06/29/2021 11:30 AM [facility] PHLEBOTOMIST [facility] None  06/29/2021 12:00 PM Ladell Pier, MD [facility] None      LOS: 3 days

## 2021-06-23 NOTE — Progress Notes (Signed)
°  Transition of Care [facility]) Screening Note   Patient Details  Name: [name] Date of Birth: 1940/05/13   Transition [facility]) CM/SW Contact:    Dawayne Patricia, RN Phone Number: 06/23/2021, 10:58 AM    Transition of Care Department [facility]) has reviewed patient and no TOC needs have been identified at this time. Noted PT/OT evals pending. We will continue to monitor patient advancement through interdisciplinary progression rounds. If new patient transition needs arise, please place a TOC consult.

## 2021-06-23 NOTE — Assessment & Plan Note (Addendum)
Acute kidney failure on CKD stage IIIa Baseline creatinine dating to January of this year approximately 1.3 with GFR 40-48 -acute worsening felt to be combination of pyelonephritis, stent removal, and acute critical illness with TTP -monitor trend  -renal U/S unremarkable

## 2021-06-23 NOTE — Assessment & Plan Note (Signed)
patch

## 2021-06-24 LAB — CULTURE, BLOOD (ROUTINE X 2)
Culture: NO GROWTH
Culture: NO GROWTH
Special Requests: ADEQUATE

## 2021-06-24 LAB — THERAPEUTIC PLASMA EXCHANGE (BLOOD BANK)
Plasma Exchange: 2200
Plasma volume needed: 2200
Unit division: 0
Unit division: 0
Unit division: 0
Unit division: 0
Unit division: 0

## 2021-06-24 LAB — BASIC METABOLIC PANEL
Anion gap: 9 (ref 5–15)
BUN: 43 mg/dL — ABNORMAL HIGH (ref 8–23)
CO2: 32 mmol/L (ref 22–32)
Calcium: 9.2 mg/dL (ref 8.9–10.3)
Chloride: 101 mmol/L (ref 98–111)
Creatinine, Ser: 1.37 mg/dL — ABNORMAL HIGH (ref 0.44–1.00)
GFR, Estimated: 39 mL/min — ABNORMAL LOW (ref >60)
Glucose, Bld: 122 mg/dL — ABNORMAL HIGH (ref 70–99)
Potassium: 4.3 mmol/L (ref 3.5–5.1)
Sodium: 142 mmol/L (ref 135–145)

## 2021-06-24 LAB — CBC WITH DIFFERENTIAL/PLATELET
Abs Immature Granulocytes: 0.12 10*3/uL — ABNORMAL HIGH (ref 0.00–0.07)
Basophils Absolute: 0 10*3/uL (ref 0.0–0.1)
Basophils Relative: 0 %
Eosinophils Absolute: 0 10*3/uL (ref 0.0–0.5)
Eosinophils Relative: 0 %
HCT: 40.6 % (ref 36.0–46.0)
Hemoglobin: 14 g/dL (ref 12.0–15.0)
Immature Granulocytes: 2 %
Lymphocytes Relative: 6 %
Lymphs Abs: 0.5 10*3/uL — ABNORMAL LOW (ref 0.7–4.0)
MCH: 32.7 pg (ref 26.0–34.0)
MCHC: 34.5 g/dL (ref 30.0–36.0)
MCV: 94.9 fL (ref 80.0–100.0)
Monocytes Absolute: 0.4 10*3/uL (ref 0.1–1.0)
Monocytes Relative: 5 %
Neutro Abs: 6.7 10*3/uL (ref 1.7–7.7)
Neutrophils Relative %: 87 %
Platelets: 34 10*3/uL — ABNORMAL LOW (ref 150–400)
RBC: 4.28 MIL/uL (ref 3.87–5.11)
RDW: 12.7 % (ref 11.5–15.5)
WBC: 7.8 10*3/uL (ref 4.0–10.5)
nRBC: 0.4 % — ABNORMAL HIGH (ref 0.0–0.2)

## 2021-06-24 LAB — LACTATE DEHYDROGENASE: LDH: 176 U/L (ref 98–192)

## 2021-06-24 MED ORDER — CALCIUM GLUCONATE-NACL 2-0.675 GM/100ML-% IV SOLN
2.0000 g | Freq: Once | INTRAVENOUS | Status: DC
  Filled 2021-06-24: qty 100

## 2021-06-24 MED ORDER — ACETAMINOPHEN 325 MG PO TABS: 650.0000 mg | ORAL_TABLET | ORAL | Status: DC | PRN

## 2021-06-24 MED ORDER — CALCIUM CARBONATE ANTACID 500 MG PO CHEW: 2.0000 | CHEWABLE_TABLET | ORAL | Status: DC

## 2021-06-24 MED ORDER — ACD FORMULA A 0.73-2.45-2.2 GM/100ML VI SOLN
1000.0000 mL | Status: DC
  Filled 2021-06-24 (×2): qty 1000

## 2021-06-24 MED ORDER — ANTICOAGULANT SODIUM CITRATE 4% (200MG/5ML) IV SOLN
5.0000 mL | Freq: Once | Status: DC
  Filled 2021-06-24: qty 5

## 2021-06-24 MED ORDER — DIPHENHYDRAMINE HCL 25 MG PO CAPS: 25.0000 mg | ORAL_CAPSULE | Freq: Four times a day (QID) | ORAL | Status: DC | PRN

## 2021-06-24 NOTE — Progress Notes (Addendum)
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, steroids, rituximab Relapse of TTP March 2015-plasma exchange, steroids, rituximab, and maintenance azathioprine Admission with severe thrombocytopenia and elevated LDH 07/16/2020, ADAMTS13 activity less than 2%, consistent with relapse of TTP       -Steroids/FFP given 07/16/2020       -Daily plasma exchange beginning 07/17/2020, 7 days, then qod starting 3/24, discharge 07/21/2020 on prednisone 60 mg daily       -Prednisone taper to 40 mg daily 07/29/2020       -rituximab 07/30/2020, 08/06/2020, 08/13/2020, 08/20/2020       -plasmapheresis on 07/31/2020, 08/03/2020, 08/05/2020, and 08/07/2020       -prednisone increased to 60 mg daily 08/17/2020       -Plasmapheresis 08/24/2020, 08/25/2020, 08/26/2020, 08/27/2020, 08/28/2020, 08/29/2020, 08/31/2020, 09/02/2020, 09/04/2020       -caplacizumab initiated 08/24/2020       -Decrease prednisone to 40 mg daily 08/28/2020       -ADAMTS13 37% on 09/04/2020       -Prednisone decreased to 30 mg daily 09/07/2020       -Continue caplacizumab       -prednisone decreased to 20 mg daily beginning 09/18/2020       -Cablivi discontinued 09/21/2020       -prednisone discontinued 09/24/2020, resumed at a dose of 20 mg daily on 10/01/2020       -prednisone taper to 10 mg daily 10/23/2020       -Prednisone taper to 5 mg daily 11/06/2020       -prednisone continued at a dose of 5 mg daily, Imuran resumed at 50 mg daily 12/03/2020       - admission has 06/19/2021 with relapse of TTP, daily plasma exchange and Solu-Medrol beginning 06/20/2021, 1 dose of caplacizumab 06/20/2021  History of epidural abscess requiring laminectomy 2012 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 Cristina Gong- no active bleeding or blood in the stomach, nor any prospective bleeding site, seen on exam. Hospital admission 06/19/2021 to [facility] with subsequent transfer to [facility], CVA, possible pyelonephritis Bladder tumor-cystoscopic resection of bladder mass with intravesical gemcitabine 06/18/2021, high-grade urothelial carcinoma, noninvasive Renal insufficiency  Ms. Gervais appears stable.  The platelet count is stable today.  She has completed 5 days of plasma exchange and remains on Solu-Medrol.  Her platelets remain stable and LDH has normalized.  Creatinine continues to slowly improve.  Recent renal ultrasound showed no hydronephrosis.  Neurologic status is back to baseline.  Recommendations: 1.  Continue daily plasmapheresis. 2.  Continue solumedrol 3.  No plans for caplacizumab given history of GI bleed while on this medication. 4.  Outpatient f/u with Urology for the bladder cancer 5.  Daily CBC, chemistry panel, and LDH from pheresis catheter 6.  Hematology will continue following her in the hospital and outpatient follow-up will be scheduled at the Cancer center. 6. PT/OT evaluation  Mikey Bussing, DNP, AGPCNP-BC, AOCNP  Ms. Beaudoin was interviewed and examined.  She appears stable.  Her neurologic status has returned to baseline.  The platelet count has been stable for the past few days.  The plan is to continue daily plasmapheresis and Solu-Medrol.  I discussed the plan with  her daughter at the bedside this morning.  The ADAMTS13 activity returned lower on hospital admission.  She may be a candidate for outpatient plasmapheresis if the platelet count improves over the next few days.  I was present for greater than 50% of today's visit.  I performed medical decision making.  SUBJECTIVE: Ms. Mull completed day 5 of plasmapheresis just prior to my visit.  She continues  to tolerate plasmapheresis well overall. No symptoms of hypocalcemia.  No bleeding.    PHYSICAL EXAMINATION:  Vitals:   06/24/21 1300 06/24/21 1315  BP: (!) 140/59 135/60  Pulse:    Resp: (!) 24 (!) 23  Temp: 98.4 F (36.9 C) 98.4 F (36.9 C)  SpO2:     Filed Weights   06/23/21 0341 06/23/21 1620 06/23/21 1820  Weight: 81.8 kg 82 kg 82 kg    Intake/Output from previous day: 02/22 0701 - 02/23 0700 In: 480 [P.O.:480] Out: 200 [Urine:200]  HEENT: Mild thrush at the left buccal mucosa Abdomen: No hepatosplenomegaly, nontender Vascular: No leg edema Neurologic: Alert, follows commands, moves all extremities to command, the left arm and hand strength appears normal, the speech is fluent Skin: Multiple small ecchymoses over the extremities   LABORATORY DATA:  I have reviewed the data as listed CMP Latest Ref Rng & Units 06/24/2021 06/23/2021 06/23/2021  Glucose 70 - 99 mg/dL 122(H) 265(H) 149(H)  BUN 8 - 23 mg/dL 43(H) 47(H) 47(H)  Creatinine 0.44 - 1.00 mg/dL 1.37(H) 1.64(H) 1.64(H)  Sodium 135 - 145 mmol/L 142 139 140  Potassium 3.5 - 5.1 mmol/L 4.3 4.2 3.9  Chloride 98 - 111 mmol/L 101 100 99  CO2 22 - 32 mmol/L 32 30 32  Calcium 8.9 - 10.3 mg/dL 9.2 8.8(L) 9.1  Total Protein 6.5 - 8.1 g/dL - - 4.9(L)  Total Bilirubin 0.3 - 1.2 mg/dL - - 0.5  Alkaline Phos 38 - 126 U/L - - 41  AST 15 - 41 U/L - - 34  ALT 0 - 44 U/L - - 26    Lab Results  Component Value Date   WBC 7.8 06/24/2021   HGB 14.0 06/24/2021   HCT 40.6 06/24/2021   MCV 94.9 06/24/2021   PLT 34 (L) 06/24/2021   NEUTROABS 6.7 06/24/2021   I reviewed the peripheral blood smear.  The platelets are markedly decreased in number.  No platelet clumps..  Rare helmet and schistocytes.  No nucleated red cells.  The polychromasia is not increased.  The majority the white cells are mature neutrophils.  No blasts or other young forms are seen. No results found for: CEA1, CEA, [id number], CA125, PSA1  CT ABDOMEN PELVIS WO  CONTRAST  Result Date: 06/19/2021 CLINICAL DATA:  Flank pain, kidney stone suspected EXAM: CT ABDOMEN AND PELVIS WITHOUT CONTRAST TECHNIQUE: Multidetector CT imaging of the abdomen and pelvis was performed following the standard protocol without IV contrast. RADIATION DOSE REDUCTION: This exam was performed according to the departmental dose-optimization program which includes automated exposure control, adjustment of the mA and/or kV according to patient size and/or use of iterative reconstruction technique. COMPARISON:  CT 05/31/2021 FINDINGS: Lower chest: Bibasilar atelectasis. There are 5 mm nodules in the peripheral lung bases (series 3, image 4). Hepatobiliary: No focal liver abnormality is seen. Possible tiny hyperdense stones in the gallbladder fundus. Gallbladder is nondilated. Pancreas: Unremarkable. No pancreatic ductal dilatation or surrounding inflammatory changes. Spleen: Normal in size without focal abnormality. Adrenals/Urinary Tract: Adrenal glands are unremarkable. Punctate nonobstructive left lower pole renal stone.  No left-sided hydronephrosis. Unchanged left lower pole cyst. Right-sided hydroureteronephrosis with perinephric and periureteral stranding. There is a small focus of gas within the right renal pelvis (series 2, image 30) likely related to recent retrograde pyelogram. Unchanged right renal cysts. There is no visible obstructing stone in the ureter. The previously seen 3 mm stone in the distal right ureter is no longer visible distant with recent ureteroscopy and lithotripsy. There is no stent in the ureter present. There is gas in the bladder consistent with recent procedure. Stomach/Bowel: Small hiatal hernia. The stomach is otherwise within normal limits. There is no evidence of bowel obstruction. The appendix is normal. There are few sigmoid diverticula. Vascular/Lymphatic: Aortoiliac atherosclerotic calcifications. No AAA. No lymphadenopathy. Reproductive: There is a 4.9 x 4.0 x  6.0 cm left adnexal structure with intermediate density, unchanged from prior exam. Other: No bowel containing hernia. No abdominopelvic ascites. No free air. Musculoskeletal: Unchanged chronic T12 compression deformity with 25% height loss anteriorly. Multilevel degenerative changes of the spine with severe degenerative disc disease and facet arthropathy in the lumbar spine. IMPRESSION: Right-sided hydroureteronephrosis with perinephric and perinephric inflammatory stranding. No obstructing stone is visible within the ureter. Small focus of gas within the right renal pelvis, likely related to recent retrograde pyelogram. There is no stent in the ureter. Recommend urology consultation. Unchanged intermediate density left adnexal lesion measuring up to 6.0 cm, which could be a hemorrhagic cyst. Given the patient is postmenopausal, nonemergent pelvic ultrasound is recommended for further evaluation if not yet performed. Two incidental 5 mm pulmonary nodules in the peripheral lung bases. No follow-up needed if patient is low-risk (and has no known or suspected primary neoplasm). Non-contrast chest CT can be considered in 12 months if patient is high-risk. This recommendation follows the consensus statement: Guidelines for Management of Incidental Pulmonary Nodules Detected on CT Images: From the Fleischner Society 2017; Radiology 2017; 284:228-243. Electronically Signed   By: Maurine Simmering M.D.   On: 06/19/2021 09:45   CT ANGIO HEAD NECK W WO CM  Result Date: 06/20/2021 CLINICAL DATA:  81 year old female code stroke presentation. Left side weakness. EXAM: CT ANGIOGRAPHY HEAD AND NECK TECHNIQUE: Multidetector CT imaging of the head and neck was performed using the standard protocol during bolus administration of intravenous contrast. Multiplanar CT image reconstructions and MIPs were obtained to evaluate the vascular anatomy. Carotid stenosis measurements (when applicable) are obtained utilizing NASCET criteria, using  the distal internal carotid diameter as the denominator. RADIATION DOSE REDUCTION: This exam was performed according to the departmental dose-optimization program which includes automated exposure control, adjustment of the mA and/or kV according to patient size and/or use of iterative reconstruction technique. CONTRAST:  85mL OMNIPAQUE IOHEXOL 350 MG/ML SOLN COMPARISON:  Plain head CT 0318 hours. FINDINGS: CTA NECK Skeleton: Osteopenia. Chronic lower cervical spine disc and endplate degeneration. Multilevel mild upper thoracic compression fractures, such as T3 and T4, appears stable from a 2021 cervical spine CT. No acute osseous abnormality identified. Upper chest: Mild dependent atelectasis. Mild retained secretions in the proximal thoracic esophagus which is nondilated. No visible mediastinal lymphadenopathy. Visible central pulmonary arteries appear patent. Other neck: Right supraclavicular venous collaterals appear incidental. Retropharyngeal course of both carotids (normal variant). No acute neck soft tissue finding. Aortic arch: Mild Calcified aortic atherosclerosis. Three vessel arch configuration. Right carotid system: Mild brachiocephalic artery calcified plaque without stenosis. Normal right CCA origin. Tortuous right CCA with a retropharyngeal carotid bifurcation. Mild right ICA origin and bulb calcified plaque without stenosis. Left carotid  system: Negative left CCA origin. Tortuous left CCA with a retropharyngeal course similar to the right. Mild calcified plaque at the left ICA origin and bulb without stenosis. Vertebral arteries: Mild calcified plaque in the proximal right subclavian artery without stenosis. Minimal calcified plaque at the right vertebral artery origin without stenosis. Right vertebral artery appears mildly non dominant and patent to the skull base without stenosis. Mild proximal left subclavian artery calcified plaque without stenosis. Normal left vertebral artery origin. Dominant  left vertebral artery is tortuous in the V2 segment and patent to the skull base without plaque or stenosis. CTA HEAD Posterior circulation: Normal distal vertebral arteries and vertebrobasilar junction. Dominant left V4 segment. Normal PICA origins. Patent basilar artery without stenosis. Normal SCA and left PCA origin. Fetal type right PCA origin. Left posterior communicating artery diminutive or absent. Bilateral PCA branches are within normal limits. Anterior circulation: Both ICA siphons are patent and tortuous. Mild to moderate calcified plaque on the left but only mild left siphon stenosis. Similar calcified plaque on the right with no significant stenosis. Normal right posterior communicating artery origin. Small infundibulum of the distal left ICA (normal variant). Patent carotid termini. Normal MCA and ACA origins. Normal anterior communicating artery. Bilateral ACA branches are within normal limits. Left MCA M1 segment and bifurcation are patent without stenosis. Right MCA M1 segment and bifurcation are patent without stenosis. Bilateral MCA branches are within normal limits. Venous sinuses: Patent. Anatomic variants: Dominant left vertebral artery. Fetal type right PCA origin. Review of the MIP images confirms the above findings IMPRESSION: 1. Negative for large vessel occlusion. And generally mild for age atherosclerosis in the head and neck, most pronounced at the ICA siphons. No significant arterial stenosis. 2. Aortic Atherosclerosis (ICD10-I70.0). 3. Osteopenia with multiple mild chronic upper thoracic compression fractures. Electronically Signed   By: Genevie Ann M.D.   On: 06/20/2021 04:16   MR BRAIN WO CONTRAST  Result Date: 06/20/2021 CLINICAL DATA:  81 year old female code stroke presentation. Left side weakness. EXAM: MRI HEAD WITHOUT CONTRAST TECHNIQUE: Multiplanar, multiecho pulse sequences of the brain and surrounding structures were obtained without intravenous contrast. COMPARISON:  Brain  MRI 09/14/2012. FINDINGS: The examination had to be discontinued prior to completion due to patient back pain. Only DWI, axial T2* and FLAIR imaging was obtained. Mildly motion degraded axial and coronal DWI imaging demonstrates multifocal, widely scattered, generally small foci of restricted diffusion in the bilateral cerebral and cerebellar hemispheres. Most confluent area is in the right centrum semiovale near the pre motor area on series 6, image 40. Essentially all vascular territories affected. Deep gray matter nuclei and brainstem are spared. No intracranial mass effect. Stable ventricle size and configuration. Basilar cisterns appear normal. Patchy additional bilateral cerebral white matter and central pontine FLAIR heterogeneity appears increased since 2014. No evidence of acute or chronic intracranial hemorrhage. IMPRESSION: 1. Small acute infarcts - most punctate - scattered throughout the bilateral cerebral and cerebellar hemispheres in keeping with a recent Embolic event from the heart or proximal aorta. Largest area of involvement is in the right frontal lobe pre-motor white matter area. No associated mass effect or hemorrhage. 2. Truncated exam, only three imaging sequences obtained. Electronically Signed   By: Genevie Ann M.D.   On: 06/20/2021 08:30   US RENAL  Result Date: 06/22/2021 CLINICAL DATA:  Renal dysfunction EXAM: RENAL / URINARY TRACT ULTRASOUND COMPLETE COMPARISON:  CT done on 06/19/2021 FINDINGS: Right Kidney: Renal measurements: 9.7 x 5.1 x 4.5 cm = volume: 117.7  mL. There is no hydronephrosis. There is interval resolution of right hydronephrosis. There is 2.4 cm cyst in the upper pole. There is slightly increased cortical echogenicity. There is no perinephric fluid collection. Left Kidney: Renal measurements: 9.8 x 5.1 x 4.9 cm = volume: 126.9 mL. There is no hydronephrosis. There is slightly increased cortical echogenicity. Bladder: Layering low-level echoes are noted in the dependent  portion of bladder lumen. Other: None. IMPRESSION: There is no hydronephrosis. Right renal cyst. Increased cortical echogenicity may suggest medical renal disease. Small amount of debris is seen in the dependent portion of urinary bladder lumen, possibly blood products. Electronically Signed   By: Elmer Picker M.D.   On: 06/22/2021 17:02   IR Fluoro Guide CV Line Left  Result Date: 06/20/2021 INDICATION: 81 year old with TTP.  Patient needs plasmapheresis. EXAM: FLUOROSCOPIC AND ULTRASOUND GUIDED PLACEMENT OF A NON-TUNNELED DIALYSIS CATHETER Physician: Stephan Minister. Henn, MD MEDICATIONS: 1% lidocaine ANESTHESIA/SEDATION: Local anesthetic FLUOROSCOPY TIME:  Radiation Exposure Index (as provided by the fluoroscopic device): 9 mGy Kerma COMPLICATIONS: None immediate. PROCEDURE: Informed consent was obtained for catheter placement. The patient was placed supine on the interventional table. Both left and right internal jugular veins appear to be occluded with ultrasound. Left external jugular vein was patent. Ultrasound was used for the procedure but an image was not saved. The left neck was prepped and draped in a sterile fashion. The left neck was anesthetized with 1% lidocaine. Maximal barrier sterile technique was utilized including caps, mask, sterile gowns, sterile gloves, sterile drape, hand hygiene and skin antiseptic. A small incision was made with #11 blade scalpel. A 21 gauge needle directed into the left external jugular vein with ultrasound guidance. A micropuncture dilator set was placed. A 24 cm Mahurkar catheter was selected. The catheter was advanced over a wire and positioned at the superior cavoatrial junction. Fluoroscopic images were obtained for documentation. Both dialysis lumens were found to aspirate and flush well. The proper amount of heparin was flushed in both lumens. The central venous lumen was flushed with normal saline. Catheter was sutured to skin. FINDINGS: Catheter tip at the  superior cavoatrial junction. Left and right internal jugular veins appear to be occluded in the lower neck. Catheter was placed through the left external jugular vein. IMPRESSION: Successful placement of a left external jugular non-tunneled dialysis catheter using ultrasound and fluoroscopic guidance. Electronically Signed   By: Markus Daft M.D.   On: 06/20/2021 20:51   IR US Guide Vasc Access Left  Result Date: 06/20/2021 INDICATION: 81 year old with TTP.  Patient needs plasmapheresis. EXAM: FLUOROSCOPIC AND ULTRASOUND GUIDED PLACEMENT OF A NON-TUNNELED DIALYSIS CATHETER Physician: Stephan Minister. Henn, MD MEDICATIONS: 1% lidocaine ANESTHESIA/SEDATION: Local anesthetic FLUOROSCOPY TIME:  Radiation Exposure Index (as provided by the fluoroscopic device): 9 mGy Kerma COMPLICATIONS: None immediate. PROCEDURE: Informed consent was obtained for catheter placement. The patient was placed supine on the interventional table. Both left and right internal jugular veins appear to be occluded with ultrasound. Left external jugular vein was patent. Ultrasound was used for the procedure but an image was not saved. The left neck was prepped and draped in a sterile fashion. The left neck was anesthetized with 1% lidocaine. Maximal barrier sterile technique was utilized including caps, mask, sterile gowns, sterile gloves, sterile drape, hand hygiene and skin antiseptic. A small incision was made with #11 blade scalpel. A 21 gauge needle directed into the left external jugular vein with ultrasound guidance. A micropuncture dilator set was placed. A 24 cm Mahurkar catheter  was selected. The catheter was advanced over a wire and positioned at the superior cavoatrial junction. Fluoroscopic images were obtained for documentation. Both dialysis lumens were found to aspirate and flush well. The proper amount of heparin was flushed in both lumens. The central venous lumen was flushed with normal saline. Catheter was sutured to skin. FINDINGS:  Catheter tip at the superior cavoatrial junction. Left and right internal jugular veins appear to be occluded in the lower neck. Catheter was placed through the left external jugular vein. IMPRESSION: Successful placement of a left external jugular non-tunneled dialysis catheter using ultrasound and fluoroscopic guidance. Electronically Signed   By: Markus Daft M.D.   On: 06/20/2021 20:51   DG OR UROLOGY CYSTO IMAGE ([facility] ONLY)  Result Date: 06/18/2021 There is no interpretation for this exam.  This order is for images obtained during a surgical procedure.  Please See "Surgeries" Tab for more information regarding the procedure.   CT HEMATURIA WORKUP  Result Date: 06/01/2021 CLINICAL DATA:  Gross hematuria in a 81 year old female. EXAM: CT ABDOMEN AND PELVIS WITHOUT AND WITH CONTRAST TECHNIQUE: Multidetector CT imaging of the abdomen and pelvis was performed following the standard protocol before and following the bolus administration of intravenous contrast. RADIATION DOSE REDUCTION: This exam was performed according to the departmental dose-optimization program which includes automated exposure control, adjustment of the mA and/or kV according to patient size and/or use of iterative reconstruction technique. CONTRAST:  131mL OMNIPAQUE IOHEXOL 300 MG/ML  SOLN COMPARISON:  Sep 24, 2020. FINDINGS: Lower chest: Incidental imaging of the lung bases without sign of effusion or consolidative change. 5 mm LEFT upper lobe pulmonary nodule (image 2/9) not imaged on previous imaging studies. No consolidation. No pleural effusion. Heart size unremarkable though incompletely imaged. Hepatobiliary: No focal, suspicious hepatic lesion. No pericholecystic stranding. No biliary duct dilation. Portal vein is patent. Pancreas: Normal, without mass, inflammation or ductal dilatation. Spleen: Normal. Adrenals/Urinary Tract: Adrenal glands are normal. Cortical scarring of the bilateral kidneys without signs of hydronephrosis or  perinephric stranding. Tiny intrarenal calculus in the interpolar RIGHT kidney measures 2-3 mm. Bilateral renal cysts are noted not substantially changed from prior imaging. No abnormal enhancement along the course of the LEFT or RIGHT ureter but with a small distal RIGHT ureteral calculus (image 64/7) this measures 3 mm. Subtle nodularity is present at the LEFT bladder base (image 72/7) 5 mm. Urinary bladder is collapsed which does limit assessment. Filling defect as well as enhancing area seen at the LEFT bladder base near the UVJ (image 68/13 in addition to the above referenced image. Stomach/Bowel: Normal appendix.  No acute gastrointestinal findings. Vascular/Lymphatic: Aortic atherosclerosis. No sign of aneurysm. Smooth contour of the IVC. There is no gastrohepatic or hepatoduodenal ligament lymphadenopathy. No retroperitoneal or mesenteric lymphadenopathy. No pelvic sidewall lymphadenopathy. Reproductive: Lobulated LEFT adnexal structure with low attenuation but with intermediate density, 39 Hounsfield units measuring 4.3 x 4.2 x 6.3 cm previously 4.8 x 4.6 x 6.0 cm. This does measure greatest craniocaudal dimension. RIGHT adnexa unremarkable by CT. Other: No ascites. Musculoskeletal: No acute bone finding. No destructive bone process. Spinal degenerative changes. Excretory phase: Filling defect of the LEFT posterolateral urinary bladder base near the UVJ as described no visible upper tract lesion. IMPRESSION: 1. Filling defect with enhancement in the LEFT posterolateral urinary bladder base near the LEFT UVJ suspicious for small urothelial neoplasm. Cystoscopy is advised. 2. 3 mm distal RIGHT ureteral calculus without current signs of obstruction. 3. Bilateral renal cysts. 4. 5 mm LEFT  upper lobe pulmonary nodule not imaged on previous imaging studies. If bladder findings are compatible with neoplasm [employer] guidelines would not apply. Suggest close attention on follow-up, if bladder cancer is  diagnosed 3 month follow-up of the chest may be warranted. 5. Lobulated LEFT adnexal structure with low attenuation but with intermediate density, 39 Hounsfield units. This does measure greatest craniocaudal dimension. This does measure 4.3 x 4.2 x 6.3 cm previously 4.8 x 4.6 x 6.0 cm. Given that this may represent a hemorrhagic cystic lesion of the adnexa in a postmenopausal patient greater than 6 cm would suggest follow-up pelvic sonogram if not yet performed. 6. Aortic atherosclerosis. Aortic Atherosclerosis (ICD10-I70.0). These results will be called to the ordering clinician or representative by the Radiologist Assistant, and communication documented in the PACS or [employer]. Electronically Signed   By: Zetta Bills M.D.   On: 06/01/2021 14:31   CT HEAD CODE STROKE WO CONTRAST  Result Date: 06/20/2021 CLINICAL DATA:  Code stroke. EXAM: CT HEAD WITHOUT CONTRAST TECHNIQUE: Contiguous axial images were obtained from the base of the skull through the vertex without intravenous contrast. RADIATION DOSE REDUCTION: This exam was performed according to the departmental dose-optimization program which includes automated exposure control, adjustment of the mA and/or kV according to patient size and/or use of iterative reconstruction technique. COMPARISON:  Prior CT from 11/02/2019. FINDINGS: Brain: Age-related cerebral atrophy with chronic small vessel ischemic disease. Small parenchymal calcific density at the posterior left frontal periventricular white matter, stable. No acute intracranial hemorrhage. No visible acute large vessel territory infarct. No mass lesion or midline shift. No hydrocephalus or extra-axial fluid collection. Vascular: No hyperdense vessel. Scattered vascular calcifications noted within the carotid siphons. Skull: Scalp soft tissues and calvarium within normal limits. Calvarium intact. Sinuses/Orbits: Globes and orbital soft tissues demonstrate no acute finding. Scattered mucosal  thickening noted within the ethmoidal air cells. Paranasal sinuses are otherwise clear. Trace left mastoid effusion noted. Other: None. ASPECTS [facility] Stroke Program Early CT Score) - Ganglionic level infarction (caudate, lentiform nuclei, internal capsule, insula, M1-M3 cortex): 7 - Supraganglionic infarction (M4-M6 cortex): 3 Total score (0-10 with 10 being normal): 10 IMPRESSION: 1. No acute intracranial abnormality. 2. ASPECTS is 10. 3. 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 These results were communicated to the nurse practitioner taking care of the patient, Sharion Settler at 3:32 am on 06/20/2021 by text page via the [facility] messaging system. Electronically Signed   By: Jeannine Boga M.D.   On: 06/20/2021 03:35     Future Appointments  Date Time Provider [facility]  06/29/2021 11:30 AM [facility] PHLEBOTOMIST [facility] None  06/29/2021 12:00 PM Ladell Pier, MD [facility] None      LOS: 4 days

## 2021-06-24 NOTE — Care Management Important Message (Signed)
Important Message  Patient Details  Name: [name] MRN: [id number] Date of Birth: Jun 03, 1940   Medicare Important Message Given:  Yes     Shelda Altes 06/24/2021, 8:00 AM

## 2021-06-24 NOTE — Evaluation (Signed)
Physical Therapy Evaluation Patient Details Name: [name] MRN: [id number] DOB: 14-Nov-1940 Today's Date: 06/24/2021  History of Present Illness  81 yo female underwent R uretoscopy with laser lithotripsy on distal ureteral stone and R ureteral stent placement 2/17. pt d/c home and dislodged stent R flank pain admitted to [facility] then moved to [facility] MRI small multiple B hemisphere infarcts, platelet trended down 16K ( suspected TTp flare) PMH COPD TTP depression tobacco use   Clinical Impression  Pt presents with condition above and deficits mentioned below, see PT Problem List. PTA, she was mod I, using furniture for support in the home and either a cart or family members for support when mobilizing in the community. Currently, pt does not appear far from baseline, displaying slight deficits in processing speed per daughter's report. Pt also with chronic lumbar issues resulting in lateral trunk flexion and slight staggering when ambulating per daughter. Pt displayed L lateral trunk flexion and a tendency to stagger to the L this date, which may be related to her chronic issues or her L-sided weakness noted at admission from the infarcts. Currently, pt displays very slight if at all weakness on her L side compared to her R with MMT. She is able to ambulate and negotiate stairs with UE support without physical assistance at this time, but does display some deficits in balance and activity tolerance. Pt would benefit from follow-up with HHPT to address her deficits mentioned above. Pt and family in agreement. Will continue to follow acutely.     Recommendations for follow up therapy are one component of a multi-disciplinary discharge planning process, led by the attending physician.  Recommendations may be updated based on patient status, additional functional criteria and insurance authorization.  Follow Up Recommendations Home health PT    Assistance Recommended at Discharge Intermittent  Supervision/Assistance  Patient can return home with the following  A little help with walking and/or transfers;A little help with bathing/dressing/bathroom;Assistance with cooking/housework;Assist for transportation;Help with stairs or ramp for entrance    Equipment Recommendations None recommended by PT  Recommendations for Other Services       Functional Status Assessment Patient has had a recent decline in their functional status and demonstrates the ability to make significant improvements in function in a reasonable and predictable amount of time.     Precautions / Restrictions Precautions Precautions: Fall Restrictions Weight Bearing Restrictions: No      Mobility  Bed Mobility Overal bed mobility: Modified Independent             General bed mobility comments: Pt able to perform all bed mobility safely without assist, HOB elevated.    Transfers Overall transfer level: Modified independent Equipment used: None               General transfer comment: Pt able to transfer sit <> stand without assistance or LOB.    Ambulation/Gait Ambulation/Gait assistance: Min guard, Supervision Gait Distance (Feet): 280 Feet Assistive device: None, IV Pole Gait Pattern/deviations: Step-through pattern, Decreased stride length, Staggering left, Trunk flexed Gait velocity: reduced Gait velocity interpretation: 1.31 - 2.62 ft/sec, indicative of limited community ambulator   General Gait Details: Pt with L lateral and anterior trunk flexion, staggering intermittently to the L, but able to recover balance without physical assist, normally reaching for an object or IV pole for support. Pt and her daughter report she tends to have a lateral flexion due to chronic lumbar issues and staggers slightly at baseline.  Stairs Stairs: Yes Stairs  assistance: Min guard Stair Management: One rail Right, Two rails, Forwards, Step to pattern Number of Stairs: 2 General stair comments:  Ascends with R rail to simulate home but pt using bil handrails to descend for stability. Step-to pattern with increased time descending. No LOB, min guard for safety.  Wheelchair Mobility    Modified Rankin (Stroke Patients Only)       Balance Overall balance assessment: Mild deficits observed, not formally tested                                           Pertinent Vitals/Pain Pain Assessment Pain Assessment: Faces Faces Pain Scale: No hurt Pain Intervention(s): Monitored during session    Home Living Family/patient expects to be discharged to:: Private residence Living Arrangements: Spouse/significant other;Children (daughter) Available Help at Discharge: Family Type of Home: House Home Access: Ramped entrance     Alternate Level Stairs-Number of Steps: Trion: Two level;1/2 bath on main level;Bed/bath upstairs;Able to live on main level with bedroom/bathroom Home Equipment: Kasandra Knudsen - single point;Wheelchair Probation officer (4 wheels);Shower seat      Prior Function Prior Level of Function : Independent/Modified Independent             Mobility Comments: Normally furniture surfs or holds onto family members for support in community. Denies any recent falls.       Hand Dominance   Dominant Hand: Right    Extremity/Trunk Assessment   Upper Extremity Assessment Upper Extremity Assessment: Defer to OT evaluation    Lower Extremity Assessment Lower Extremity Assessment: Overall WFL for tasks assessed (very slight if at all weakness on L side with MMT compared to R, scoring 4 with bil hip flexion, 5 bil knee extension, 4+ bil knee flexion, 5 bil ankle dorsiflexion)    Cervical / Trunk Assessment Cervical / Trunk Assessment: Normal  Communication   Communication: No difficulties  Cognition Arousal/Alertness: Awake/alert Behavior During Therapy: WFL for tasks assessed/performed Overall Cognitive Status: Impaired/Different from  baseline Area of Impairment: Problem solving                             Problem Solving: Slow processing General Comments: Daughter reports pt with slightly slowed processing compared to baseline, but pretty close to baseline at this time.        General Comments General comments (skin integrity, edema, etc.): VSS on RA, kept pt on RA, RN made aware    Exercises     Assessment/Plan    PT Assessment Patient needs continued PT services  PT Problem List Decreased activity tolerance;Decreased balance;Decreased mobility;Decreased cognition       PT Treatment Interventions DME instruction;Gait training;Stair training;Functional mobility training;Therapeutic activities;Therapeutic exercise;Balance training;Neuromuscular re-education;Patient/family education;Cognitive remediation    PT Goals (Current goals can be found in the Care Plan section)  Acute Rehab PT Goals Patient Stated Goal: to go home PT Goal Formulation: With patient/family Time For Goal Achievement: 07/08/21 Potential to Achieve Goals: Good    Frequency Min 2X/week     Co-evaluation               AM-PAC PT "6 Clicks" Mobility  Outcome Measure Help needed turning from your back to your side while in a flat bed without using bedrails?: None Help needed moving from lying on your back to sitting on the side of a  flat bed without using bedrails?: None Help needed moving to and from a bed to a chair (including a wheelchair)?: None Help needed standing up from a chair using your arms (e.g., wheelchair or bedside chair)?: None Help needed to walk in hospital room?: A Little Help needed climbing 3-5 steps with a railing? : A Little 6 Click Score: 22    End of Session   Activity Tolerance: Patient tolerated treatment well Patient left: in bed;with call bell/phone within reach;with bed alarm set;with family/visitor present Nurse Communication: Mobility status;Other (comment) (sats) PT Visit  Diagnosis: Unsteadiness on feet (R26.81);Other abnormalities of gait and mobility (R26.89);Difficulty in walking, not elsewhere classified (R26.2)    Time: 3888-7579 PT Time Calculation (min) (ACUTE ONLY): 23 min   Charges:   PT Evaluation $PT Eval Moderate Complexity: 1 Mod PT Treatments $Gait Training: 8-22 mins        Moishe Spice, PT, DPT Acute Rehabilitation Services  Pager: [phone] Office: [phone]   Orvan Falconer 06/24/2021, 5:51 PM

## 2021-06-24 NOTE — Progress Notes (Signed)
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  After returning home her stent became partially dislodged during a coughing spell, so she and her daughter completely removed it. She subsequently reported worsening right-sided flank pain.  She presented to the [facility] ED where she was found to have pyelonephritis and started on IV Rocephin.   Overnight 2/18 > 2/19 while at [facility] the patient developed left-sided weakness and a code stroke was called.  Her symptoms soon after started improving, though not fully resolving. CT head/CTa head did not note any acute findings or large vessel occlusions. MRI was consistent with small multiple infarcts in B hemispheres. Her platelets trended down abruptly from 89K > 24K > 16K. It was suspected that she was in a TTP flare therefore arrangements were made to transfer her to [facility] for plasmapheresis.   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) Continue plasmapheresis under direction of Hematology -plan is for 7 days (day #5 is 2/23)    AKI (acute kidney injury) (Ruth)- (present on admission) Acute kidney failure on CKD stage IIIa Baseline creatinine dating to January of this year approximately 1.3 with GFR 40-48 -acute worsening felt to be combination of pyelonephritis, stent removal, and acute critical illness with TTP -monitor trend  -renal U/S unremarkable  Acute left-sided weakness CTA neck unremarkable for any large vessel occlusion - MRI noted multiple B  acute small multifocal infarcts - felt to be due to TTP - avoid ASA/Plavix due to TTP - PT/OT/SLP  Nicotine dependence- (present on admission) -patch    On O2- wean as able as she does not wear at home    Subjective: wants to go home Worked with OT yesterday  Physical Exam: Vitals:   06/23/21 2321 06/24/21 0300 06/24/21 0822 06/24/21 0935  BP: (!) 137/54 (!) 127/50 (!) 152/53 (!) 146/54  Pulse: 70 60 70   Resp: 18 17 17  (!) 24  Temp:  98.5 F (36.9 C) 98.5 F (36.9 C) 98.3 F (36.8 C)  TempSrc:  Oral Oral Oral  SpO2: 98% 93% 93% 97%  Weight:      Height:        General: Appearance:    Obese female in no acute distress     Lungs:     respirations unlabored  Heart:    Normal heart rate.    MS:   All extremities are intact.    Neurologic:   Awake     Data Reviewed: CR down to 1.37 Plts stable at 34    Disposition: Status is: Inpatient Remains inpatient appropriate because: needs 7 days of plasmapheresis          Planned Discharge Destination: Home   Time spent: 65 minutes  Author: Geradine Girt, DO 06/24/2021 9:44 AM  For on call review [email or web address].

## 2021-06-25 ENCOUNTER — Inpatient Hospital Stay (HOSPITAL_COMMUNITY): Payer: Medicare Other

## 2021-06-25 HISTORY — PX: IR US GUIDE VASC ACCESS LEFT: IMG2389

## 2021-06-25 HISTORY — PX: IR FLUORO GUIDE CV LINE LEFT: IMG2282

## 2021-06-25 HISTORY — PX: IR FLUORO GUIDE CV LINE RIGHT: IMG2283

## 2021-06-25 LAB — THERAPEUTIC PLASMA EXCHANGE (BLOOD BANK)
Plasma Exchange: 2200
Plasma volume needed: 2200
Unit division: 0
Unit division: 0
Unit division: 0
Unit division: 0
Unit division: 0
Unit division: 0
Unit division: 0

## 2021-06-25 LAB — CBC WITH DIFFERENTIAL/PLATELET
Abs Immature Granulocytes: 0.2 10*3/uL — ABNORMAL HIGH (ref 0.00–0.07)
Basophils Absolute: 0 10*3/uL (ref 0.0–0.1)
Basophils Relative: 0 %
Eosinophils Absolute: 0 10*3/uL (ref 0.0–0.5)
Eosinophils Relative: 0 %
HCT: 41.4 % (ref 36.0–46.0)
Hemoglobin: 14.5 g/dL (ref 12.0–15.0)
Immature Granulocytes: 3 %
Lymphocytes Relative: 6 %
Lymphs Abs: 0.4 10*3/uL — ABNORMAL LOW (ref 0.7–4.0)
MCH: 33.3 pg (ref 26.0–34.0)
MCHC: 35 g/dL (ref 30.0–36.0)
MCV: 95 fL (ref 80.0–100.0)
Monocytes Absolute: 0.2 10*3/uL (ref 0.1–1.0)
Monocytes Relative: 3 %
Neutro Abs: 6 10*3/uL (ref 1.7–7.7)
Neutrophils Relative %: 88 %
Platelets: 39 10*3/uL — ABNORMAL LOW (ref 150–400)
RBC: 4.36 MIL/uL (ref 3.87–5.11)
RDW: 12.6 % (ref 11.5–15.5)
WBC: 6.8 10*3/uL (ref 4.0–10.5)
nRBC: 0 % (ref 0.0–0.2)

## 2021-06-25 LAB — BASIC METABOLIC PANEL
Anion gap: 9 (ref 5–15)
BUN: 42 mg/dL — ABNORMAL HIGH (ref 8–23)
CO2: 32 mmol/L (ref 22–32)
Calcium: 8.6 mg/dL — ABNORMAL LOW (ref 8.9–10.3)
Chloride: 99 mmol/L (ref 98–111)
Creatinine, Ser: 1.17 mg/dL — ABNORMAL HIGH (ref 0.44–1.00)
GFR, Estimated: 47 mL/min — ABNORMAL LOW (ref >60)
Glucose, Bld: 145 mg/dL — ABNORMAL HIGH (ref 70–99)
Potassium: 4.3 mmol/L (ref 3.5–5.1)
Sodium: 140 mmol/L (ref 135–145)

## 2021-06-25 LAB — LACTATE DEHYDROGENASE: LDH: 206 U/L — ABNORMAL HIGH (ref 98–192)

## 2021-06-25 MED ORDER — LIDOCAINE HCL 1 % IJ SOLN
INTRAMUSCULAR | Status: AC
  Filled 2021-06-25: qty 20

## 2021-06-25 MED ORDER — MIDAZOLAM HCL 2 MG/2ML IJ SOLN
INTRAMUSCULAR | Status: AC
  Filled 2021-06-25: qty 2

## 2021-06-25 MED ORDER — FENTANYL CITRATE (PF) 100 MCG/2ML IJ SOLN
INTRAMUSCULAR | Status: AC | PRN
  Administered 2021-06-25 (×3): 25 ug via INTRAVENOUS

## 2021-06-25 MED ORDER — CALCIUM GLUCONATE-NACL 2-0.675 GM/100ML-% IV SOLN
2.0000 g | Freq: Once | INTRAVENOUS | Status: AC
  Administered 2021-06-25: 2000 mg via INTRAVENOUS
  Filled 2021-06-25: qty 100

## 2021-06-25 MED ORDER — VANCOMYCIN HCL IN DEXTROSE 1-5 GM/200ML-% IV SOLN
INTRAVENOUS | Status: AC
Start: 2021-06-25 — End: 2021-06-25
  Administered 2021-06-25: 1000 mg via INTRAVENOUS
  Filled 2021-06-25: qty 200

## 2021-06-25 MED ORDER — CHLORHEXIDINE GLUCONATE 4 % EX LIQD
CUTANEOUS | Status: AC
  Filled 2021-06-25: qty 15

## 2021-06-25 MED ORDER — DIPHENHYDRAMINE HCL 25 MG PO CAPS
25.0000 mg | ORAL_CAPSULE | Freq: Four times a day (QID) | ORAL | Status: DC | PRN
  Administered 2021-06-25: 25 mg via ORAL
  Filled 2021-06-25: qty 1

## 2021-06-25 MED ORDER — ACD FORMULA A 0.73-2.45-2.2 GM/100ML VI SOLN
1000.0000 mL | Status: DC
  Administered 2021-06-25: 1000 mL
  Filled 2021-06-25: qty 1000

## 2021-06-25 MED ORDER — VANCOMYCIN HCL IN DEXTROSE 1-5 GM/200ML-% IV SOLN
1000.0000 mg | Freq: Once | INTRAVENOUS | Status: AC
  Filled 2021-06-25: qty 200

## 2021-06-25 MED ORDER — LIDOCAINE HCL 1 % IJ SOLN
INTRAMUSCULAR | Status: AC | PRN
  Administered 2021-06-25: 10 mL via INTRADERMAL

## 2021-06-25 MED ORDER — ANTICOAGULANT SODIUM CITRATE 4% (200MG/5ML) IV SOLN
10.0000 mL | Freq: Once | Status: DC
  Filled 2021-06-25: qty 10

## 2021-06-25 MED ORDER — FENTANYL CITRATE (PF) 100 MCG/2ML IJ SOLN
INTRAMUSCULAR | Status: AC
  Filled 2021-06-25: qty 2

## 2021-06-25 MED ORDER — MIDAZOLAM HCL 2 MG/2ML IJ SOLN
INTRAMUSCULAR | Status: AC | PRN
  Administered 2021-06-25 (×3): .5 mg via INTRAVENOUS

## 2021-06-25 MED ORDER — ACETAMINOPHEN 325 MG PO TABS
650.0000 mg | ORAL_TABLET | ORAL | Status: DC | PRN
  Administered 2021-06-25: 650 mg via ORAL
  Filled 2021-06-25: qty 2

## 2021-06-25 MED ORDER — CALCIUM CARBONATE ANTACID 500 MG PO CHEW
2.0000 | CHEWABLE_TABLET | Freq: Once | ORAL | Status: AC
  Administered 2021-06-25: 400 mg via ORAL
  Filled 2021-06-25 (×2): qty 2

## 2021-06-25 MED ORDER — ANTICOAGULANT SODIUM CITRATE 4% (200MG/5ML) IV SOLN
5.0000 mL | Status: AC
  Administered 2021-06-25: 5 mL
  Filled 2021-06-25 (×2): qty 5

## 2021-06-25 NOTE — Progress Notes (Addendum)
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, steroids, rituximab Relapse of TTP March 2015-plasma exchange, steroids, rituximab, and maintenance azathioprine Admission with severe thrombocytopenia and elevated LDH 07/16/2020, ADAMTS13 activity less than 2%, consistent with relapse of TTP       -Steroids/FFP given 07/16/2020       -Daily plasma exchange beginning 07/17/2020, 7 days, then qod starting 3/24, discharge 07/21/2020 on prednisone 60 mg daily       -Prednisone taper to 40 mg daily 07/29/2020       -rituximab 07/30/2020, 08/06/2020, 08/13/2020, 08/20/2020       -plasmapheresis on 07/31/2020, 08/03/2020, 08/05/2020, and 08/07/2020       -prednisone increased to 60 mg daily 08/17/2020       -Plasmapheresis 08/24/2020, 08/25/2020, 08/26/2020, 08/27/2020, 08/28/2020, 08/29/2020, 08/31/2020, 09/02/2020, 09/04/2020       -caplacizumab initiated 08/24/2020       -Decrease prednisone to 40 mg daily 08/28/2020       -ADAMTS13 37% on 09/04/2020       -Prednisone decreased to 30 mg daily 09/07/2020       -Continue caplacizumab       -prednisone decreased to 20 mg daily beginning 09/18/2020       -Cablivi discontinued 09/21/2020       -prednisone discontinued 09/24/2020, resumed at a dose of 20 mg daily on 10/01/2020       -prednisone taper to 10 mg daily 10/23/2020       -Prednisone taper to 5 mg daily 11/06/2020       -prednisone continued at a dose of 5 mg daily, Imuran resumed at 50 mg daily 12/03/2020       - admission has 06/19/2021 with relapse of TTP, daily plasma exchange and Solu-Medrol beginning 06/20/2021, 1 dose of caplacizumab 06/20/2021  History of epidural abscess requiring laminectomy 2012 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 Cristina Gong- no active bleeding or blood in the stomach, nor any prospective bleeding site, seen on exam. Hospital admission 06/19/2021 to [facility] with subsequent transfer to [facility] ([facility]), CVA, possible pyelonephritis Bladder tumor-cystoscopic resection of bladder mass with intravesical gemcitabine 06/18/2021, high-grade urothelial carcinoma, noninvasive Renal insufficiency  Ms. Lesmeister appears stable.  The platelet count is stable today.  She has completed 6 days of plasma exchange and remains on Solu-Medrol.  Her platelets remain stable and her LDH has improved.  Creatinine continues to slowly improve.  Recent renal ultrasound showed no hydronephrosis.  Neurologic status is back to baseline.  Recommendations: 1.  Continue daily plasmapheresis  2.  Continue solumedrol 3.  No plans for caplacizumab given history of GI bleed while on this medication. 4.  Outpatient f/u with Urology for the bladder cancer 5.  Daily CBC, chemistry panel, and LDH from pheresis catheter 6.  Change nontunneled catheter to tunneled catheter in preparation for discharge. 7.  I have communicated with the hemodialysis unit of the need for plasmapheresis again on Saturday and Monday.  She will need plasmapheresis Sunday if the platelets are not improved tomorrow.  They are aware that there is the potential Ms. Vogel could discharge tomorrow after pheresis if the platelets are improved.  We will contact the hemodialysis with plans for  plasmapheresis.. 8.  Outpatient follow-up will be scheduled at the Cancer center.  Mikey Bussing, DNP, AGPCNP-BC, AOCNP  Ms.Oetken was interviewed and examined.  She appears stable.  The platelet count is improved slightly today.  The creatinine is better.  Her neurologic status has returned to baseline.  I recommend continuing daily plasma exchange and Solu-Medrol until the platelet count  rises further.  We can consider adding rituximab if there is no improvement by early next week, but it is unclear whether she benefited from rituximab last year.  I will discuss the case with Dr. Irene Limbo.  Hematology will continue seeing her daily.  I was present for greater than 50% of today's visit.  I performed medical decision making.  SUBJECTIVE: Ms. Casebier completed day 6 of plasmapheresis earlier today.  She tolerated well.  No symptoms of hypocalcemia.  Daughter was at the bedside and we discussed plan to change temporary pheresis catheter to tunneled catheter by IR today.  PHYSICAL EXAMINATION:  Vitals:   06/25/21 0338 06/25/21 0744  BP: 140/64 (!) 138/51  Pulse: 65 60  Resp: 17 18  Temp: 98.4 F (36.9 C) 98.5 F (36.9 C)  SpO2: 94% 100%   Filed Weights   06/23/21 1620 06/23/21 1820 06/25/21 0338  Weight: 82 kg 82 kg 82 kg    Intake/Output from previous day: 02/23 0701 - 02/24 0700 In: 480 [P.O.:480] Out: 1200 [Urine:1200]  Abdomen: No hepatosplenomegaly, nontender Vascular: No leg edema Neurologic: Alert, follows commands, moves all extremities to command, the left arm and hand strength appears normal, the speech is fluent Skin: Multiple small ecchymoses over the extremities   LABORATORY DATA:  I have reviewed the data as listed CMP Latest Ref Rng & Units 06/25/2021 06/24/2021 06/23/2021  Glucose 70 - 99 mg/dL 145(H) 122(H) 265(H)  BUN 8 - 23 mg/dL 42(H) 43(H) 47(H)  Creatinine 0.44 - 1.00 mg/dL 1.17(H) 1.37(H) 1.64(H)  Sodium 135 - 145 mmol/L 140 142 139  Potassium 3.5 - 5.1 mmol/L 4.3 4.3 4.2  Chloride 98 - 111 mmol/L 99 101 100  CO2 22 - 32 mmol/L 32 32 30  Calcium 8.9 - 10.3 mg/dL 8.6(L) 9.2 8.8(L)  Total Protein 6.5 - 8.1 g/dL - - -  Total Bilirubin 0.3 - 1.2 mg/dL - - -  Alkaline Phos 38 - 126 U/L - - -  AST 15 - 41 U/L - - -  ALT 0 - 44 U/L - - -    Lab Results  Component Value Date   WBC 6.8 06/25/2021   HGB 14.5 06/25/2021   HCT 41.4 06/25/2021    MCV 95.0 06/25/2021   PLT 39 (L) 06/25/2021   NEUTROABS 6.0 06/25/2021   I reviewed the peripheral blood smear.  The platelets are markedly decreased in number.  No platelet clumps..  Rare helmet and schistocytes.  No nucleated red cells.  The polychromasia is not increased.  The majority the white cells are mature neutrophils.  No blasts or other young forms are seen. No results found for: CEA1, CEA, [id number], CA125, PSA1  CT ABDOMEN PELVIS WO CONTRAST  Result Date: 06/19/2021 CLINICAL DATA:  Flank pain, kidney stone suspected EXAM: CT ABDOMEN AND PELVIS WITHOUT CONTRAST TECHNIQUE: Multidetector CT imaging of the abdomen and pelvis was performed following the standard protocol without IV contrast. RADIATION DOSE REDUCTION: This exam was performed according to the departmental dose-optimization program which includes automated exposure control, adjustment of the mA and/or kV according to patient size and/or use of iterative reconstruction technique.  COMPARISON:  CT 05/31/2021 FINDINGS: Lower chest: Bibasilar atelectasis. There are 5 mm nodules in the peripheral lung bases (series 3, image 4). Hepatobiliary: No focal liver abnormality is seen. Possible tiny hyperdense stones in the gallbladder fundus. Gallbladder is nondilated. Pancreas: Unremarkable. No pancreatic ductal dilatation or surrounding inflammatory changes. Spleen: Normal in size without focal abnormality. Adrenals/Urinary Tract: Adrenal glands are unremarkable. Punctate nonobstructive left lower pole renal stone. No left-sided hydronephrosis. Unchanged left lower pole cyst. Right-sided hydroureteronephrosis with perinephric and periureteral stranding. There is a small focus of gas within the right renal pelvis (series 2, image 30) likely related to recent retrograde pyelogram. Unchanged right renal cysts. There is no visible obstructing stone in the ureter. The previously seen 3 mm stone in the distal right ureter is no longer visible distant with  recent ureteroscopy and lithotripsy. There is no stent in the ureter present. There is gas in the bladder consistent with recent procedure. Stomach/Bowel: Small hiatal hernia. The stomach is otherwise within normal limits. There is no evidence of bowel obstruction. The appendix is normal. There are few sigmoid diverticula. Vascular/Lymphatic: Aortoiliac atherosclerotic calcifications. No AAA. No lymphadenopathy. Reproductive: There is a 4.9 x 4.0 x 6.0 cm left adnexal structure with intermediate density, unchanged from prior exam. Other: No bowel containing hernia. No abdominopelvic ascites. No free air. Musculoskeletal: Unchanged chronic T12 compression deformity with 25% height loss anteriorly. Multilevel degenerative changes of the spine with severe degenerative disc disease and facet arthropathy in the lumbar spine. IMPRESSION: Right-sided hydroureteronephrosis with perinephric and perinephric inflammatory stranding. No obstructing stone is visible within the ureter. Small focus of gas within the right renal pelvis, likely related to recent retrograde pyelogram. There is no stent in the ureter. Recommend urology consultation. Unchanged intermediate density left adnexal lesion measuring up to 6.0 cm, which could be a hemorrhagic cyst. Given the patient is postmenopausal, nonemergent pelvic ultrasound is recommended for further evaluation if not yet performed. Two incidental 5 mm pulmonary nodules in the peripheral lung bases. No follow-up needed if patient is low-risk (and has no known or suspected primary neoplasm). Non-contrast chest CT can be considered in 12 months if patient is high-risk. This recommendation follows the consensus statement: Guidelines for Management of Incidental Pulmonary Nodules Detected on CT Images: From the Fleischner Society 2017; Radiology 2017; 284:228-243. Electronically Signed   By: Maurine Simmering M.D.   On: 06/19/2021 09:45   CT ANGIO HEAD NECK W WO CM  Result Date:  06/20/2021 CLINICAL DATA:  81 year old female code stroke presentation. Left side weakness. EXAM: CT ANGIOGRAPHY HEAD AND NECK TECHNIQUE: Multidetector CT imaging of the head and neck was performed using the standard protocol during bolus administration of intravenous contrast. Multiplanar CT image reconstructions and MIPs were obtained to evaluate the vascular anatomy. Carotid stenosis measurements (when applicable) are obtained utilizing NASCET criteria, using the distal internal carotid diameter as the denominator. RADIATION DOSE REDUCTION: This exam was performed according to the departmental dose-optimization program which includes automated exposure control, adjustment of the mA and/or kV according to patient size and/or use of iterative reconstruction technique. CONTRAST:  23mL OMNIPAQUE IOHEXOL 350 MG/ML SOLN COMPARISON:  Plain head CT 0318 hours. FINDINGS: CTA NECK Skeleton: Osteopenia. Chronic lower cervical spine disc and endplate degeneration. Multilevel mild upper thoracic compression fractures, such as T3 and T4, appears stable from a 2021 cervical spine CT. No acute osseous abnormality identified. Upper chest: Mild dependent atelectasis. Mild retained secretions in the proximal thoracic esophagus which is nondilated. No visible mediastinal lymphadenopathy.  Visible central pulmonary arteries appear patent. Other neck: Right supraclavicular venous collaterals appear incidental. Retropharyngeal course of both carotids (normal variant). No acute neck soft tissue finding. Aortic arch: Mild Calcified aortic atherosclerosis. Three vessel arch configuration. Right carotid system: Mild brachiocephalic artery calcified plaque without stenosis. Normal right CCA origin. Tortuous right CCA with a retropharyngeal carotid bifurcation. Mild right ICA origin and bulb calcified plaque without stenosis. Left carotid system: Negative left CCA origin. Tortuous left CCA with a retropharyngeal course similar to the right.  Mild calcified plaque at the left ICA origin and bulb without stenosis. Vertebral arteries: Mild calcified plaque in the proximal right subclavian artery without stenosis. Minimal calcified plaque at the right vertebral artery origin without stenosis. Right vertebral artery appears mildly non dominant and patent to the skull base without stenosis. Mild proximal left subclavian artery calcified plaque without stenosis. Normal left vertebral artery origin. Dominant left vertebral artery is tortuous in the V2 segment and patent to the skull base without plaque or stenosis. CTA HEAD Posterior circulation: Normal distal vertebral arteries and vertebrobasilar junction. Dominant left V4 segment. Normal PICA origins. Patent basilar artery without stenosis. Normal SCA and left PCA origin. Fetal type right PCA origin. Left posterior communicating artery diminutive or absent. Bilateral PCA branches are within normal limits. Anterior circulation: Both ICA siphons are patent and tortuous. Mild to moderate calcified plaque on the left but only mild left siphon stenosis. Similar calcified plaque on the right with no significant stenosis. Normal right posterior communicating artery origin. Small infundibulum of the distal left ICA (normal variant). Patent carotid termini. Normal MCA and ACA origins. Normal anterior communicating artery. Bilateral ACA branches are within normal limits. Left MCA M1 segment and bifurcation are patent without stenosis. Right MCA M1 segment and bifurcation are patent without stenosis. Bilateral MCA branches are within normal limits. Venous sinuses: Patent. Anatomic variants: Dominant left vertebral artery. Fetal type right PCA origin. Review of the MIP images confirms the above findings IMPRESSION: 1. Negative for large vessel occlusion. And generally mild for age atherosclerosis in the head and neck, most pronounced at the ICA siphons. No significant arterial stenosis. 2. Aortic Atherosclerosis  (ICD10-I70.0). 3. Osteopenia with multiple mild chronic upper thoracic compression fractures. Electronically Signed   By: Genevie Ann M.D.   On: 06/20/2021 04:16   MR BRAIN WO CONTRAST  Result Date: 06/20/2021 CLINICAL DATA:  81 year old female code stroke presentation. Left side weakness. EXAM: MRI HEAD WITHOUT CONTRAST TECHNIQUE: Multiplanar, multiecho pulse sequences of the brain and surrounding structures were obtained without intravenous contrast. COMPARISON:  Brain MRI 09/14/2012. FINDINGS: The examination had to be discontinued prior to completion due to patient back pain. Only DWI, axial T2* and FLAIR imaging was obtained. Mildly motion degraded axial and coronal DWI imaging demonstrates multifocal, widely scattered, generally small foci of restricted diffusion in the bilateral cerebral and cerebellar hemispheres. Most confluent area is in the right centrum semiovale near the pre motor area on series 6, image 40. Essentially all vascular territories affected. Deep gray matter nuclei and brainstem are spared. No intracranial mass effect. Stable ventricle size and configuration. Basilar cisterns appear normal. Patchy additional bilateral cerebral white matter and central pontine FLAIR heterogeneity appears increased since 2014. No evidence of acute or chronic intracranial hemorrhage. IMPRESSION: 1. Small acute infarcts - most punctate - scattered throughout the bilateral cerebral and cerebellar hemispheres in keeping with a recent Embolic event from the heart or proximal aorta. Largest area of involvement is in the right frontal lobe pre-motor  white matter area. No associated mass effect or hemorrhage. 2. Truncated exam, only three imaging sequences obtained. Electronically Signed   By: Genevie Ann M.D.   On: 06/20/2021 08:30   US RENAL  Result Date: 06/22/2021 CLINICAL DATA:  Renal dysfunction EXAM: RENAL / URINARY TRACT ULTRASOUND COMPLETE COMPARISON:  CT done on 06/19/2021 FINDINGS: Right Kidney: Renal  measurements: 9.7 x 5.1 x 4.5 cm = volume: 117.7 mL. There is no hydronephrosis. There is interval resolution of right hydronephrosis. There is 2.4 cm cyst in the upper pole. There is slightly increased cortical echogenicity. There is no perinephric fluid collection. Left Kidney: Renal measurements: 9.8 x 5.1 x 4.9 cm = volume: 126.9 mL. There is no hydronephrosis. There is slightly increased cortical echogenicity. Bladder: Layering low-level echoes are noted in the dependent portion of bladder lumen. Other: None. IMPRESSION: There is no hydronephrosis. Right renal cyst. Increased cortical echogenicity may suggest medical renal disease. Small amount of debris is seen in the dependent portion of urinary bladder lumen, possibly blood products. Electronically Signed   By: Elmer Picker M.D.   On: 06/22/2021 17:02   IR Fluoro Guide CV Line Left  Result Date: 06/20/2021 INDICATION: 81 year old with TTP.  Patient needs plasmapheresis. EXAM: FLUOROSCOPIC AND ULTRASOUND GUIDED PLACEMENT OF A NON-TUNNELED DIALYSIS CATHETER Physician: Stephan Minister. Henn, MD MEDICATIONS: 1% lidocaine ANESTHESIA/SEDATION: Local anesthetic FLUOROSCOPY TIME:  Radiation Exposure Index (as provided by the fluoroscopic device): 9 mGy Kerma COMPLICATIONS: None immediate. PROCEDURE: Informed consent was obtained for catheter placement. The patient was placed supine on the interventional table. Both left and right internal jugular veins appear to be occluded with ultrasound. Left external jugular vein was patent. Ultrasound was used for the procedure but an image was not saved. The left neck was prepped and draped in a sterile fashion. The left neck was anesthetized with 1% lidocaine. Maximal barrier sterile technique was utilized including caps, mask, sterile gowns, sterile gloves, sterile drape, hand hygiene and skin antiseptic. A small incision was made with #11 blade scalpel. A 21 gauge needle directed into the left external jugular vein with  ultrasound guidance. A micropuncture dilator set was placed. A 24 cm Mahurkar catheter was selected. The catheter was advanced over a wire and positioned at the superior cavoatrial junction. Fluoroscopic images were obtained for documentation. Both dialysis lumens were found to aspirate and flush well. The proper amount of heparin was flushed in both lumens. The central venous lumen was flushed with normal saline. Catheter was sutured to skin. FINDINGS: Catheter tip at the superior cavoatrial junction. Left and right internal jugular veins appear to be occluded in the lower neck. Catheter was placed through the left external jugular vein. IMPRESSION: Successful placement of a left external jugular non-tunneled dialysis catheter using ultrasound and fluoroscopic guidance. Electronically Signed   By: Markus Daft M.D.   On: 06/20/2021 20:51   IR US Guide Vasc Access Left  Result Date: 06/20/2021 INDICATION: 81 year old with TTP.  Patient needs plasmapheresis. EXAM: FLUOROSCOPIC AND ULTRASOUND GUIDED PLACEMENT OF A NON-TUNNELED DIALYSIS CATHETER Physician: Stephan Minister. Henn, MD MEDICATIONS: 1% lidocaine ANESTHESIA/SEDATION: Local anesthetic FLUOROSCOPY TIME:  Radiation Exposure Index (as provided by the fluoroscopic device): 9 mGy Kerma COMPLICATIONS: None immediate. PROCEDURE: Informed consent was obtained for catheter placement. The patient was placed supine on the interventional table. Both left and right internal jugular veins appear to be occluded with ultrasound. Left external jugular vein was patent. Ultrasound was used for the procedure but an image was not saved. The left  neck was prepped and draped in a sterile fashion. The left neck was anesthetized with 1% lidocaine. Maximal barrier sterile technique was utilized including caps, mask, sterile gowns, sterile gloves, sterile drape, hand hygiene and skin antiseptic. A small incision was made with #11 blade scalpel. A 21 gauge needle directed into the left  external jugular vein with ultrasound guidance. A micropuncture dilator set was placed. A 24 cm Mahurkar catheter was selected. The catheter was advanced over a wire and positioned at the superior cavoatrial junction. Fluoroscopic images were obtained for documentation. Both dialysis lumens were found to aspirate and flush well. The proper amount of heparin was flushed in both lumens. The central venous lumen was flushed with normal saline. Catheter was sutured to skin. FINDINGS: Catheter tip at the superior cavoatrial junction. Left and right internal jugular veins appear to be occluded in the lower neck. Catheter was placed through the left external jugular vein. IMPRESSION: Successful placement of a left external jugular non-tunneled dialysis catheter using ultrasound and fluoroscopic guidance. Electronically Signed   By: Markus Daft M.D.   On: 06/20/2021 20:51   DG OR UROLOGY CYSTO IMAGE ([facility] ONLY)  Result Date: 06/18/2021 There is no interpretation for this exam.  This order is for images obtained during a surgical procedure.  Please See "Surgeries" Tab for more information regarding the procedure.   CT HEMATURIA WORKUP  Result Date: 06/01/2021 CLINICAL DATA:  Gross hematuria in a 81 year old female. EXAM: CT ABDOMEN AND PELVIS WITHOUT AND WITH CONTRAST TECHNIQUE: Multidetector CT imaging of the abdomen and pelvis was performed following the standard protocol before and following the bolus administration of intravenous contrast. RADIATION DOSE REDUCTION: This exam was performed according to the departmental dose-optimization program which includes automated exposure control, adjustment of the mA and/or kV according to patient size and/or use of iterative reconstruction technique. CONTRAST:  149mL OMNIPAQUE IOHEXOL 300 MG/ML  SOLN COMPARISON:  Sep 24, 2020. FINDINGS: Lower chest: Incidental imaging of the lung bases without sign of effusion or consolidative change. 5 mm LEFT upper lobe pulmonary nodule  (image 2/9) not imaged on previous imaging studies. No consolidation. No pleural effusion. Heart size unremarkable though incompletely imaged. Hepatobiliary: No focal, suspicious hepatic lesion. No pericholecystic stranding. No biliary duct dilation. Portal vein is patent. Pancreas: Normal, without mass, inflammation or ductal dilatation. Spleen: Normal. Adrenals/Urinary Tract: Adrenal glands are normal. Cortical scarring of the bilateral kidneys without signs of hydronephrosis or perinephric stranding. Tiny intrarenal calculus in the interpolar RIGHT kidney measures 2-3 mm. Bilateral renal cysts are noted not substantially changed from prior imaging. No abnormal enhancement along the course of the LEFT or RIGHT ureter but with a small distal RIGHT ureteral calculus (image 64/7) this measures 3 mm. Subtle nodularity is present at the LEFT bladder base (image 72/7) 5 mm. Urinary bladder is collapsed which does limit assessment. Filling defect as well as enhancing area seen at the LEFT bladder base near the UVJ (image 68/13 in addition to the above referenced image. Stomach/Bowel: Normal appendix.  No acute gastrointestinal findings. Vascular/Lymphatic: Aortic atherosclerosis. No sign of aneurysm. Smooth contour of the IVC. There is no gastrohepatic or hepatoduodenal ligament lymphadenopathy. No retroperitoneal or mesenteric lymphadenopathy. No pelvic sidewall lymphadenopathy. Reproductive: Lobulated LEFT adnexal structure with low attenuation but with intermediate density, 39 Hounsfield units measuring 4.3 x 4.2 x 6.3 cm previously 4.8 x 4.6 x 6.0 cm. This does measure greatest craniocaudal dimension. RIGHT adnexa unremarkable by CT. Other: No ascites. Musculoskeletal: No acute bone finding. No destructive  bone process. Spinal degenerative changes. Excretory phase: Filling defect of the LEFT posterolateral urinary bladder base near the UVJ as described no visible upper tract lesion. IMPRESSION: 1. Filling defect with  enhancement in the LEFT posterolateral urinary bladder base near the LEFT UVJ suspicious for small urothelial neoplasm. Cystoscopy is advised. 2. 3 mm distal RIGHT ureteral calculus without current signs of obstruction. 3. Bilateral renal cysts. 4. 5 mm LEFT upper lobe pulmonary nodule not imaged on previous imaging studies. If bladder findings are compatible with neoplasm [employer] guidelines would not apply. Suggest close attention on follow-up, if bladder cancer is diagnosed 3 month follow-up of the chest may be warranted. 5. Lobulated LEFT adnexal structure with low attenuation but with intermediate density, 39 Hounsfield units. This does measure greatest craniocaudal dimension. This does measure 4.3 x 4.2 x 6.3 cm previously 4.8 x 4.6 x 6.0 cm. Given that this may represent a hemorrhagic cystic lesion of the adnexa in a postmenopausal patient greater than 6 cm would suggest follow-up pelvic sonogram if not yet performed. 6. Aortic atherosclerosis. Aortic Atherosclerosis (ICD10-I70.0). These results will be called to the ordering clinician or representative by the Radiologist Assistant, and communication documented in the PACS or [employer]. Electronically Signed   By: Zetta Bills M.D.   On: 06/01/2021 14:31   CT HEAD CODE STROKE WO CONTRAST  Result Date: 06/20/2021 CLINICAL DATA:  Code stroke. EXAM: CT HEAD WITHOUT CONTRAST TECHNIQUE: Contiguous axial images were obtained from the base of the skull through the vertex without intravenous contrast. RADIATION DOSE REDUCTION: This exam was performed according to the departmental dose-optimization program which includes automated exposure control, adjustment of the mA and/or kV according to patient size and/or use of iterative reconstruction technique. COMPARISON:  Prior CT from 11/02/2019. FINDINGS: Brain: Age-related cerebral atrophy with chronic small vessel ischemic disease. Small parenchymal calcific density at the posterior left frontal  periventricular white matter, stable. No acute intracranial hemorrhage. No visible acute large vessel territory infarct. No mass lesion or midline shift. No hydrocephalus or extra-axial fluid collection. Vascular: No hyperdense vessel. Scattered vascular calcifications noted within the carotid siphons. Skull: Scalp soft tissues and calvarium within normal limits. Calvarium intact. Sinuses/Orbits: Globes and orbital soft tissues demonstrate no acute finding. Scattered mucosal thickening noted within the ethmoidal air cells. Paranasal sinuses are otherwise clear. Trace left mastoid effusion noted. Other: None. ASPECTS [facility] Stroke Program Early CT Score) - Ganglionic level infarction (caudate, lentiform nuclei, internal capsule, insula, M1-M3 cortex): 7 - Supraganglionic infarction (M4-M6 cortex): 3 Total score (0-10 with 10 being normal): 10 IMPRESSION: 1. No acute intracranial abnormality. 2. ASPECTS is 10. 3. 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 These results were communicated to the nurse practitioner taking care of the patient, Sharion Settler at 3:32 am on 06/20/2021 by text page via the [facility] messaging system. Electronically Signed   By: Jeannine Boga M.D.   On: 06/20/2021 03:35     Future Appointments  Date Time Provider [place]  06/29/2021 11:30 AM [facility] PHLEBOTOMIST [facility] None  06/29/2021 12:00 PM Ladell Pier, MD [facility] None      LOS: 5 days

## 2021-06-25 NOTE — Progress Notes (Signed)
°  Progress Note   Patient: [name] BWG:[id number] DOB: 08/04/1940 DOA: 06/20/2021     5 DOS: the patient was seen and examined on 06/25/2021   Brief hospital course: 81yo female with history of DVT/PE, TTP, COPD, tobacco use, and depression who underwent right ureteroscopy with laser lithotripsy of distal ureteral stone, right ureteroscopy and laser lithotripsy of renal stone, right ureteral stent placement, and bladder biopsy and fulguration with instillation of gemcitabine 06/18/21.  After returning home her stent became partially dislodged during a coughing spell, so she and her daughter completely removed it. She subsequently reported worsening right-sided flank pain.  She presented to the [facility] ED where she was found to have pyelonephritis and started on IV Rocephin.   Overnight 2/18 > 2/19 while at [facility] the patient developed left-sided weakness and a code stroke was called.  Her symptoms soon after started improving, though not fully resolving. CT head/CTa head did not note any acute findings or large vessel occlusions. MRI was consistent with small multiple infarcts in B hemispheres. Her platelets trended down abruptly from 89K > 24K > 16K. It was suspected that she was in a TTP flare therefore arrangements were made to transfer her to [facility] for plasmapheresis.  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) Continue plasmapheresis under direction of Hematology -plan is for 7 days (day #6 is 2/24)    AKI (acute kidney injury) ([place])- (present on admission) Acute kidney failure on CKD stage IIIa Baseline creatinine dating to January of this year approximately 1.3 with GFR 40-48 -acute worsening felt to be combination of pyelonephritis, stent removal, and acute critical illness with TTP -monitor trend  -renal U/S unremarkable  Acute left-sided weakness CTA neck unremarkable for any large vessel occlusion - MRI  noted multiple B acute small multifocal infarcts - felt to be due to TTP - avoid ASA/Plavix due to TTP - PT/OT/SLP  Nicotine dependence- (present on admission) -patch    Home health ordered for home  Subjective: no complaints  Physical Exam: Vitals:   06/25/21 1119 06/25/21 1131 06/25/21 1139 06/25/21 1147  BP: (!) 155/59 (!) 150/60 (!) 156/57 (!) 143/58  Pulse: 66 66 62 66  Resp: 18 20 20 20   Temp: 98.7 F (37.1 C) 98.6 F (37 C) 98.4 F (36.9 C) 98.6 F (37 C)  TempSrc:      SpO2: 100% 100% 100% 100%  Weight:      Height:        General: Appearance:    Obese female in no acute distress     Lungs:      respirations unlabored  Heart:    Normal heart rate.   MS:   All extremities are intact.    Neurologic:   Awake, alert     Data Reviewed: {Cr improving  Family Communication: daughter at bedside 2/23  Disposition: Status is: Inpatient Remains inpatient appropriate because: needs plasmapheresis          Planned Discharge Destination: Home with Home Health   Time spent: 45 minutes  Author: Geradine Girt, DO 06/25/2021 11:55 AM  For on call review [email or web address].

## 2021-06-25 NOTE — Progress Notes (Signed)
Mobility Cancellation Note   Pt going down to cath lab at this time. Will f/u as schedule allows.  [facility] Phone: [phone] Office Phone: [phone]'

## 2021-06-25 NOTE — Consult Note (Signed)
Chief Complaint: Patient was seen in consultation today for tunneled central venous catheter  Referring Physician(s): Mikey Bussing, NP  Supervising Physician: Aletta Edouard  Patient Status: [facility] - In-pt  History of Present Illness: [name] is a 81 y.o. female with a past medical history significant for depression, tobacco use, COPD, anemia, DVT/PE, relapsing TTP who presented to [facility] ED on 06/19/21 with complaints of right flank pain. She underwent right ureteroscopy with laser lithotripsy of distal ureteral stone, right ureteral stent placement, bladder biopsy and fulguration with instillation of gemcitabine 06/18/21. The stent was removed at home by the patient and her daughter on 2/18, subsequently developing flank pain. She was admitted for observation due to leukocytosis, overnight on 2/18 she developed left sided weakness and a code stroke was call - MRI consistent with small multiple infarcts in both hemispheres. She was also noted to have significantly down trending platelets concerning for TTP flare so decision was made to transfer to [facility] for plasmapheresis. A temporary CVC via left IJ approach was placed in IR on 06/20/21. IR has been consulted for tunneled CVC placement for ongoing plasmapheresis after discharge.  Patient seen in her room, daughter at bedside - she reports history of multiple catheters in her chest and is concerned about the amount of scar tissue she has, she states "I'm gonna scream like a banshee down there" because this is usually painful for her. She asks that the catheter stay on the left side if possible. She is still feeling weak on the left side and asks that her daughter sign her consent.   Past Medical History:  Diagnosis Date   Abnormal laboratory test 09/10/2013   Persistent elevation LDH   Acute delirium    Adnexal cyst 09/25/2020   Anemia    Arthritis    Bacteremia 03/2011   Blood dyscrasia    Blood transfusion    Bronchitis,  chronic obstructive w acute bronchitis (HCC) 08/12/2013   Chronic back pain    COPD (chronic obstructive pulmonary disease) (HCC)    Depression    "mild"   Endocarditis    Epidural abscess    Heart murmur    History of plasmapheresis 08/08/2013   Active for 3rd relapse of TTP   History of TTP (thrombotic thrombocytopenic purpura)    Hypokalemia 08/08/2013   Steroid related   Normal echocardiogram 03/15/2011   Oral thrush 08/08/2013   Septicemia 03/2011   Spinal stenosis, lumbar    Tobacco abuse 05/10/2013    Past Surgical History:  Procedure Laterality Date   BACK SURGERY     BLADDER INSTILLATION N/A 06/18/2021   Procedure: BLADDER INSTILLATION OF GEMCITABINE;  Surgeon: Billey Co, MD;  Location: [facility] ORS;  Service: Urology;  Laterality: N/A;   CATARACT EXTRACTION W/ INTRAOCULAR LENS  IMPLANT, BILATERAL  ~ 2010   CESAREAN SECTION  10/09/1976   CYSTOSCOPY/URETEROSCOPY/HOLMIUM LASER/STENT PLACEMENT Right 06/18/2021   Procedure: CYSTOSCOPY/URETEROSCOPY/HOLMIUM LASER/STENT PLACEMENT;  Surgeon: Billey Co, MD;  Location: [facility] ORS;  Service: Urology;  Laterality: Right;   ESOPHAGOGASTRODUODENOSCOPY (EGD) WITH PROPOFOL N/A 09/27/2020   Procedure: ESOPHAGOGASTRODUODENOSCOPY (EGD) WITH PROPOFOL;  Surgeon: Ronald Lobo, MD;  Location: [facility] ENDOSCOPY;  Service: Endoscopy;  Laterality: N/A;   EYE SURGERY     INSERTION OF DIALYSIS CATHETER Left 07/12/2013   Procedure: INSERTION OF DIALYSIS CATHETER   with Ultrasound;  Surgeon: Rosetta Posner, MD;  Location: [place];  Service: Vascular;  Laterality: Left;   IR FLUORO GUIDE CV LINE LEFT  06/20/2021  IR PERC TUN PERIT CATH WO PORT S&I /IMAG  07/16/2020   IR RADIOLOGIST EVAL & MGMT  09/26/2020   IR US GUIDE VASC ACCESS LEFT  07/16/2020   IR US GUIDE VASC ACCESS LEFT  06/20/2021   LUMBAR LAMINECTOMY/DECOMPRESSION MICRODISCECTOMY  03/16/2011   Procedure: LUMBAR LAMINECTOMY/DECOMPRESSION MICRODISCECTOMY;  Surgeon: Elaina Hoops;  Location: [place]  NEURO ORS;  Service: Neurosurgery;  Laterality: N/A;   TEE WITHOUT CARDIOVERSION  03/21/2011   Procedure: TRANSESOPHAGEAL ECHOCARDIOGRAM (TEE);  Surgeon: Laverda Page;  Location: [facility] ENDOSCOPY;  Service: Cardiovascular;  Laterality: N/A;  TEE for vegetations   TONSILLECTOMY     TRANSURETHRAL RESECTION OF BLADDER TUMOR N/A 06/18/2021   Procedure: TRANSURETHRAL RESECTION OF BLADDER TUMOR (TURBT);  Surgeon: Billey Co, MD;  Location: [facility] ORS;  Service: Urology;  Laterality: N/A;   TUBAL LIGATION      Allergies: Adhesive [tape], Cefuroxime axetil, Cefuroxime axetil, Pedi-pre tape spray [wound dressing adhesive], Other, Sulfa antibiotics, and Sulfa drugs cross reactors  Medications: Prior to Admission medications   Medication Sig Start Date End Date Taking? Authorizing Provider  azaTHIOprine (IMURAN) 50 MG tablet Take 2 tablets (100 mg total) by mouth daily. 03/12/21   [name], NP  cholecalciferol (VITAMIN D) 400 UNITS TABS Take 400 Units by mouth daily.    [provider]  cyanocobalamin 1000 MCG tablet Take 1,000 mcg by mouth daily.    [provider]  FLUoxetine (PROZAC) 20 MG capsule Take 20 mg by mouth daily.    Kirk Ruths, MD  folic acid (FOLVITE) 1 MG tablet Take 1 mg by mouth daily.    [provider]  gabapentin (NEURONTIN) 600 MG tablet Take 600 mg by mouth 3 (three) times daily as needed (pain).    [provider]  latanoprost (XALATAN) 0.005 % ophthalmic solution Place 1 drop into both eyes. 05/31/21   [provider]  Multiple Vitamin (MULTIVITAMIN) tablet Take 1 tablet by mouth daily.     [provider]  polyethylene glycol (MIRALAX / GLYCOLAX) packet Take 17 g by mouth daily. Patient taking differently: Take 17 g by mouth daily as needed for moderate constipation. 04/08/17   Earleen Newport, MD  polyvinyl alcohol (LIQUIFILM TEARS) 1.4 % ophthalmic solution Place 1 drop into both eyes as needed for  dry eyes.    [provider]  predniSONE (DELTASONE) 5 MG tablet Take 1 tablet (5 mg total) by mouth daily with breakfast. 05/26/21   Ladell Pier, MD  QUEtiapine (SEROQUEL) 25 MG tablet Take 25 mg by mouth at bedtime.    [provider]  rosuvastatin (CRESTOR) 20 MG tablet Take 20 mg by mouth at bedtime. 05/22/20   [provider]  tiotropium (SPIRIVA) 18 MCG inhalation capsule Place 18 mcg into inhaler and inhale daily.    [provider]  traMADol (ULTRAM) 50 MG tablet Take 1 tablet (50 mg total) by mouth 2 (two) times daily. Patient taking differently: Take 50 mg by mouth every 6 (six) hours as needed for moderate pain. 09/27/20   Samuella Cota, MD     Family History  Problem Relation Age of Onset   Heart disease Mother    Leukemia Sister    Breast cancer Neg Hx    Colon cancer Neg Hx    Ovarian cancer Neg Hx    Uterine cancer Neg Hx    Cervical cancer Neg Hx     Social History   Socioeconomic History  Marital status: Married    Spouse name: Not on file   Number of children: Not on file   Years of education: Not on file   Highest education level: Not on file  Occupational History   Not on file  Tobacco Use   Smoking status: Every Day    Packs/day: 3.00    Years: 55.00    Pack years: 165.00    Types: Cigarettes    Passive exposure: Never   Smokeless tobacco: Never   Tobacco comments:    Vapors  Vaping Use   Vaping Use: Every day  Substance and Sexual Activity   Alcohol use: Yes    Alcohol/week: 7.0 standard drinks    Types: 7 Cans of beer per week    Comment: Beer a night.   Drug use: No   Sexual activity: Not Currently    Partners: Male  Other Topics Concern   Not on file  Social History Narrative   Not on file   Social Determinants of Health   Financial Resource Strain: Not on file  Food Insecurity: Not on file  Transportation Needs: Not on file  Physical Activity: Not on file  Stress: Not on file  Social  Connections: Not on file     Review of Systems: A 12 point ROS discussed and pertinent positives are indicated in the HPI above.  All other systems are negative.  Review of Systems  Constitutional:  Negative for chills and fever.  Respiratory:  Negative for cough and shortness of breath.   Cardiovascular:  Negative for chest pain.  Gastrointestinal:  Negative for abdominal pain, nausea and vomiting.  Musculoskeletal:  Negative for back pain.  Neurological:  Negative for dizziness and headaches.   Vital Signs: BP (!) 150/58    Pulse 66    Temp 98.8 F (37.1 C)    Resp 18    Ht 5\' 4"  (1.626 m)    Wt 180 lb 12.4 oz (82 kg)    SpO2 100%    BMI 31.03 kg/m   Physical Exam Vitals and nursing note reviewed.  Constitutional:      General: She is not in acute distress. HENT:     Head: Normocephalic.     Mouth/Throat:     Mouth: Mucous membranes are moist.     Pharynx: Oropharynx is clear. No oropharyngeal exudate or posterior oropharyngeal erythema.  Cardiovascular:     Rate and Rhythm: Normal rate and regular rhythm.     Comments: (+) left temp CVC in place Pulmonary:     Effort: Pulmonary effort is normal.     Breath sounds: Normal breath sounds.  Abdominal:     General: There is no distension.     Palpations: Abdomen is soft.     Tenderness: There is no abdominal tenderness.  Skin:    General: Skin is warm and dry.  Neurological:     Mental Status: She is alert. Mental status is at baseline.  Psychiatric:        Mood and Affect: Mood normal.        Behavior: Behavior normal.        Thought Content: Thought content normal.        Judgment: Judgment normal.     MD Evaluation Airway: WNL Heart: WNL Abdomen: WNL Chest/ Lungs: WNL ASA  Classification: 2 Mallampati/Airway Score: Two   Imaging: CT ABDOMEN PELVIS WO CONTRAST  Result Date: 06/19/2021 CLINICAL DATA:  Flank pain, kidney stone suspected EXAM: CT ABDOMEN AND  PELVIS WITHOUT CONTRAST TECHNIQUE: Multidetector  CT imaging of the abdomen and pelvis was performed following the standard protocol without IV contrast. RADIATION DOSE REDUCTION: This exam was performed according to the departmental dose-optimization program which includes automated exposure control, adjustment of the mA and/or kV according to patient size and/or use of iterative reconstruction technique. COMPARISON:  CT 05/31/2021 FINDINGS: Lower chest: Bibasilar atelectasis. There are 5 mm nodules in the peripheral lung bases (series 3, image 4). Hepatobiliary: No focal liver abnormality is seen. Possible tiny hyperdense stones in the gallbladder fundus. Gallbladder is nondilated. Pancreas: Unremarkable. No pancreatic ductal dilatation or surrounding inflammatory changes. Spleen: Normal in size without focal abnormality. Adrenals/Urinary Tract: Adrenal glands are unremarkable. Punctate nonobstructive left lower pole renal stone. No left-sided hydronephrosis. Unchanged left lower pole cyst. Right-sided hydroureteronephrosis with perinephric and periureteral stranding. There is a small focus of gas within the right renal pelvis (series 2, image 30) likely related to recent retrograde pyelogram. Unchanged right renal cysts. There is no visible obstructing stone in the ureter. The previously seen 3 mm stone in the distal right ureter is no longer visible distant with recent ureteroscopy and lithotripsy. There is no stent in the ureter present. There is gas in the bladder consistent with recent procedure. Stomach/Bowel: Small hiatal hernia. The stomach is otherwise within normal limits. There is no evidence of bowel obstruction. The appendix is normal. There are few sigmoid diverticula. Vascular/Lymphatic: Aortoiliac atherosclerotic calcifications. No AAA. No lymphadenopathy. Reproductive: There is a 4.9 x 4.0 x 6.0 cm left adnexal structure with intermediate density, unchanged from prior exam. Other: No bowel containing hernia. No abdominopelvic ascites. No free  air. Musculoskeletal: Unchanged chronic T12 compression deformity with 25% height loss anteriorly. Multilevel degenerative changes of the spine with severe degenerative disc disease and facet arthropathy in the lumbar spine. IMPRESSION: Right-sided hydroureteronephrosis with perinephric and perinephric inflammatory stranding. No obstructing stone is visible within the ureter. Small focus of gas within the right renal pelvis, likely related to recent retrograde pyelogram. There is no stent in the ureter. Recommend urology consultation. Unchanged intermediate density left adnexal lesion measuring up to 6.0 cm, which could be a hemorrhagic cyst. Given the patient is postmenopausal, nonemergent pelvic ultrasound is recommended for further evaluation if not yet performed. Two incidental 5 mm pulmonary nodules in the peripheral lung bases. No follow-up needed if patient is low-risk (and has no known or suspected primary neoplasm). Non-contrast chest CT can be considered in 12 months if patient is high-risk. This recommendation follows the consensus statement: Guidelines for Management of Incidental Pulmonary Nodules Detected on CT Images: From the Fleischner Society 2017; Radiology 2017; 284:228-243. Electronically Signed   By: Maurine Simmering M.D.   On: 06/19/2021 09:45   CT ANGIO HEAD NECK W WO CM  Result Date: 06/20/2021 CLINICAL DATA:  80 year old female code stroke presentation. Left side weakness. EXAM: CT ANGIOGRAPHY HEAD AND NECK TECHNIQUE: Multidetector CT imaging of the head and neck was performed using the standard protocol during bolus administration of intravenous contrast. Multiplanar CT image reconstructions and MIPs were obtained to evaluate the vascular anatomy. Carotid stenosis measurements (when applicable) are obtained utilizing NASCET criteria, using the distal internal carotid diameter as the denominator. RADIATION DOSE REDUCTION: This exam was performed according to the departmental  dose-optimization program which includes automated exposure control, adjustment of the mA and/or kV according to patient size and/or use of iterative reconstruction technique. CONTRAST:  75mL OMNIPAQUE IOHEXOL 350 MG/ML SOLN COMPARISON:  Plain head CT 0318 hours. FINDINGS:  CTA NECK Skeleton: Osteopenia. Chronic lower cervical spine disc and endplate degeneration. Multilevel mild upper thoracic compression fractures, such as T3 and T4, appears stable from a 2021 cervical spine CT. No acute osseous abnormality identified. Upper chest: Mild dependent atelectasis. Mild retained secretions in the proximal thoracic esophagus which is nondilated. No visible mediastinal lymphadenopathy. Visible central pulmonary arteries appear patent. Other neck: Right supraclavicular venous collaterals appear incidental. Retropharyngeal course of both carotids (normal variant). No acute neck soft tissue finding. Aortic arch: Mild Calcified aortic atherosclerosis. Three vessel arch configuration. Right carotid system: Mild brachiocephalic artery calcified plaque without stenosis. Normal right CCA origin. Tortuous right CCA with a retropharyngeal carotid bifurcation. Mild right ICA origin and bulb calcified plaque without stenosis. Left carotid system: Negative left CCA origin. Tortuous left CCA with a retropharyngeal course similar to the right. Mild calcified plaque at the left ICA origin and bulb without stenosis. Vertebral arteries: Mild calcified plaque in the proximal right subclavian artery without stenosis. Minimal calcified plaque at the right vertebral artery origin without stenosis. Right vertebral artery appears mildly non dominant and patent to the skull base without stenosis. Mild proximal left subclavian artery calcified plaque without stenosis. Normal left vertebral artery origin. Dominant left vertebral artery is tortuous in the V2 segment and patent to the skull base without plaque or stenosis. CTA HEAD Posterior  circulation: Normal distal vertebral arteries and vertebrobasilar junction. Dominant left V4 segment. Normal PICA origins. Patent basilar artery without stenosis. Normal SCA and left PCA origin. Fetal type right PCA origin. Left posterior communicating artery diminutive or absent. Bilateral PCA branches are within normal limits. Anterior circulation: Both ICA siphons are patent and tortuous. Mild to moderate calcified plaque on the left but only mild left siphon stenosis. Similar calcified plaque on the right with no significant stenosis. Normal right posterior communicating artery origin. Small infundibulum of the distal left ICA (normal variant). Patent carotid termini. Normal MCA and ACA origins. Normal anterior communicating artery. Bilateral ACA branches are within normal limits. Left MCA M1 segment and bifurcation are patent without stenosis. Right MCA M1 segment and bifurcation are patent without stenosis. Bilateral MCA branches are within normal limits. Venous sinuses: Patent. Anatomic variants: Dominant left vertebral artery. Fetal type right PCA origin. Review of the MIP images confirms the above findings IMPRESSION: 1. Negative for large vessel occlusion. And generally mild for age atherosclerosis in the head and neck, most pronounced at the ICA siphons. No significant arterial stenosis. 2. Aortic Atherosclerosis (ICD10-I70.0). 3. Osteopenia with multiple mild chronic upper thoracic compression fractures. Electronically Signed   By: Genevie Ann M.D.   On: 06/20/2021 04:16   MR BRAIN WO CONTRAST  Result Date: 06/20/2021 CLINICAL DATA:  81 year old female code stroke presentation. Left side weakness. EXAM: MRI HEAD WITHOUT CONTRAST TECHNIQUE: Multiplanar, multiecho pulse sequences of the brain and surrounding structures were obtained without intravenous contrast. COMPARISON:  Brain MRI 09/14/2012. FINDINGS: The examination had to be discontinued prior to completion due to patient back pain. Only DWI, axial  T2* and FLAIR imaging was obtained. Mildly motion degraded axial and coronal DWI imaging demonstrates multifocal, widely scattered, generally small foci of restricted diffusion in the bilateral cerebral and cerebellar hemispheres. Most confluent area is in the right centrum semiovale near the pre motor area on series 6, image 40. Essentially all vascular territories affected. Deep gray matter nuclei and brainstem are spared. No intracranial mass effect. Stable ventricle size and configuration. Basilar cisterns appear normal. Patchy additional bilateral cerebral white matter and central  pontine FLAIR heterogeneity appears increased since 2014. No evidence of acute or chronic intracranial hemorrhage. IMPRESSION: 1. Small acute infarcts - most punctate - scattered throughout the bilateral cerebral and cerebellar hemispheres in keeping with a recent Embolic event from the heart or proximal aorta. Largest area of involvement is in the right frontal lobe pre-motor white matter area. No associated mass effect or hemorrhage. 2. Truncated exam, only three imaging sequences obtained. Electronically Signed   By: Genevie Ann M.D.   On: 06/20/2021 08:30   US RENAL  Result Date: 06/22/2021 CLINICAL DATA:  Renal dysfunction EXAM: RENAL / URINARY TRACT ULTRASOUND COMPLETE COMPARISON:  CT done on 06/19/2021 FINDINGS: Right Kidney: Renal measurements: 9.7 x 5.1 x 4.5 cm = volume: 117.7 mL. There is no hydronephrosis. There is interval resolution of right hydronephrosis. There is 2.4 cm cyst in the upper pole. There is slightly increased cortical echogenicity. There is no perinephric fluid collection. Left Kidney: Renal measurements: 9.8 x 5.1 x 4.9 cm = volume: 126.9 mL. There is no hydronephrosis. There is slightly increased cortical echogenicity. Bladder: Layering low-level echoes are noted in the dependent portion of bladder lumen. Other: None. IMPRESSION: There is no hydronephrosis. Right renal cyst. Increased cortical  echogenicity may suggest medical renal disease. Small amount of debris is seen in the dependent portion of urinary bladder lumen, possibly blood products. Electronically Signed   By: Elmer Picker M.D.   On: 06/22/2021 17:02   IR Fluoro Guide CV Line Left  Result Date: 06/20/2021 INDICATION: 81 year old with TTP.  Patient needs plasmapheresis. EXAM: FLUOROSCOPIC AND ULTRASOUND GUIDED PLACEMENT OF A NON-TUNNELED DIALYSIS CATHETER Physician: Stephan Minister. Henn, MD MEDICATIONS: 1% lidocaine ANESTHESIA/SEDATION: Local anesthetic FLUOROSCOPY TIME:  Radiation Exposure Index (as provided by the fluoroscopic device): 9 mGy Kerma COMPLICATIONS: None immediate. PROCEDURE: Informed consent was obtained for catheter placement. The patient was placed supine on the interventional table. Both left and right internal jugular veins appear to be occluded with ultrasound. Left external jugular vein was patent. Ultrasound was used for the procedure but an image was not saved. The left neck was prepped and draped in a sterile fashion. The left neck was anesthetized with 1% lidocaine. Maximal barrier sterile technique was utilized including caps, mask, sterile gowns, sterile gloves, sterile drape, hand hygiene and skin antiseptic. A small incision was made with #11 blade scalpel. A 21 gauge needle directed into the left external jugular vein with ultrasound guidance. A micropuncture dilator set was placed. A 24 cm Mahurkar catheter was selected. The catheter was advanced over a wire and positioned at the superior cavoatrial junction. Fluoroscopic images were obtained for documentation. Both dialysis lumens were found to aspirate and flush well. The proper amount of heparin was flushed in both lumens. The central venous lumen was flushed with normal saline. Catheter was sutured to skin. FINDINGS: Catheter tip at the superior cavoatrial junction. Left and right internal jugular veins appear to be occluded in the lower neck. Catheter  was placed through the left external jugular vein. IMPRESSION: Successful placement of a left external jugular non-tunneled dialysis catheter using ultrasound and fluoroscopic guidance. Electronically Signed   By: Markus Daft M.D.   On: 06/20/2021 20:51   IR US Guide Vasc Access Left  Result Date: 06/20/2021 INDICATION: 81 year old with TTP.  Patient needs plasmapheresis. EXAM: FLUOROSCOPIC AND ULTRASOUND GUIDED PLACEMENT OF A NON-TUNNELED DIALYSIS CATHETER Physician: Stephan Minister. Anselm Pancoast, MD MEDICATIONS: 1% lidocaine ANESTHESIA/SEDATION: Local anesthetic FLUOROSCOPY TIME:  Radiation Exposure Index (as provided by the fluoroscopic device):  9 mGy Kerma COMPLICATIONS: None immediate. PROCEDURE: Informed consent was obtained for catheter placement. The patient was placed supine on the interventional table. Both left and right internal jugular veins appear to be occluded with ultrasound. Left external jugular vein was patent. Ultrasound was used for the procedure but an image was not saved. The left neck was prepped and draped in a sterile fashion. The left neck was anesthetized with 1% lidocaine. Maximal barrier sterile technique was utilized including caps, mask, sterile gowns, sterile gloves, sterile drape, hand hygiene and skin antiseptic. A small incision was made with #11 blade scalpel. A 21 gauge needle directed into the left external jugular vein with ultrasound guidance. A micropuncture dilator set was placed. A 24 cm Mahurkar catheter was selected. The catheter was advanced over a wire and positioned at the superior cavoatrial junction. Fluoroscopic images were obtained for documentation. Both dialysis lumens were found to aspirate and flush well. The proper amount of heparin was flushed in both lumens. The central venous lumen was flushed with normal saline. Catheter was sutured to skin. FINDINGS: Catheter tip at the superior cavoatrial junction. Left and right internal jugular veins appear to be occluded in the  lower neck. Catheter was placed through the left external jugular vein. IMPRESSION: Successful placement of a left external jugular non-tunneled dialysis catheter using ultrasound and fluoroscopic guidance. Electronically Signed   By: Markus Daft M.D.   On: 06/20/2021 20:51   DG OR UROLOGY CYSTO IMAGE ([facility] ONLY)  Result Date: 06/18/2021 There is no interpretation for this exam.  This order is for images obtained during a surgical procedure.  Please See "Surgeries" Tab for more information regarding the procedure.   CT HEMATURIA WORKUP  Result Date: 06/01/2021 CLINICAL DATA:  Gross hematuria in a 81 year old female. EXAM: CT ABDOMEN AND PELVIS WITHOUT AND WITH CONTRAST TECHNIQUE: Multidetector CT imaging of the abdomen and pelvis was performed following the standard protocol before and following the bolus administration of intravenous contrast. RADIATION DOSE REDUCTION: This exam was performed according to the departmental dose-optimization program which includes automated exposure control, adjustment of the mA and/or kV according to patient size and/or use of iterative reconstruction technique. CONTRAST:  117mL OMNIPAQUE IOHEXOL 300 MG/ML  SOLN COMPARISON:  Sep 24, 2020. FINDINGS: Lower chest: Incidental imaging of the lung bases without sign of effusion or consolidative change. 5 mm LEFT upper lobe pulmonary nodule (image 2/9) not imaged on previous imaging studies. No consolidation. No pleural effusion. Heart size unremarkable though incompletely imaged. Hepatobiliary: No focal, suspicious hepatic lesion. No pericholecystic stranding. No biliary duct dilation. Portal vein is patent. Pancreas: Normal, without mass, inflammation or ductal dilatation. Spleen: Normal. Adrenals/Urinary Tract: Adrenal glands are normal. Cortical scarring of the bilateral kidneys without signs of hydronephrosis or perinephric stranding. Tiny intrarenal calculus in the interpolar RIGHT kidney measures 2-3 mm. Bilateral renal cysts  are noted not substantially changed from prior imaging. No abnormal enhancement along the course of the LEFT or RIGHT ureter but with a small distal RIGHT ureteral calculus (image 64/7) this measures 3 mm. Subtle nodularity is present at the LEFT bladder base (image 72/7) 5 mm. Urinary bladder is collapsed which does limit assessment. Filling defect as well as enhancing area seen at the LEFT bladder base near the UVJ (image 68/13 in addition to the above referenced image. Stomach/Bowel: Normal appendix.  No acute gastrointestinal findings. Vascular/Lymphatic: Aortic atherosclerosis. No sign of aneurysm. Smooth contour of the IVC. There is no gastrohepatic or hepatoduodenal ligament lymphadenopathy. No retroperitoneal or  mesenteric lymphadenopathy. No pelvic sidewall lymphadenopathy. Reproductive: Lobulated LEFT adnexal structure with low attenuation but with intermediate density, 39 Hounsfield units measuring 4.3 x 4.2 x 6.3 cm previously 4.8 x 4.6 x 6.0 cm. This does measure greatest craniocaudal dimension. RIGHT adnexa unremarkable by CT. Other: No ascites. Musculoskeletal: No acute bone finding. No destructive bone process. Spinal degenerative changes. Excretory phase: Filling defect of the LEFT posterolateral urinary bladder base near the UVJ as described no visible upper tract lesion. IMPRESSION: 1. Filling defect with enhancement in the LEFT posterolateral urinary bladder base near the LEFT UVJ suspicious for small urothelial neoplasm. Cystoscopy is advised. 2. 3 mm distal RIGHT ureteral calculus without current signs of obstruction. 3. Bilateral renal cysts. 4. 5 mm LEFT upper lobe pulmonary nodule not imaged on previous imaging studies. If bladder findings are compatible with neoplasm [employer] guidelines would not apply. Suggest close attention on follow-up, if bladder cancer is diagnosed 3 month follow-up of the chest may be warranted. 5. Lobulated LEFT adnexal structure with low attenuation but  with intermediate density, 39 Hounsfield units. This does measure greatest craniocaudal dimension. This does measure 4.3 x 4.2 x 6.3 cm previously 4.8 x 4.6 x 6.0 cm. Given that this may represent a hemorrhagic cystic lesion of the adnexa in a postmenopausal patient greater than 6 cm would suggest follow-up pelvic sonogram if not yet performed. 6. Aortic atherosclerosis. Aortic Atherosclerosis (ICD10-I70.0). These results will be called to the ordering clinician or representative by the Radiologist Assistant, and communication documented in the PACS or [employer]. Electronically Signed   By: Zetta Bills M.D.   On: 06/01/2021 14:31   CT HEAD CODE STROKE WO CONTRAST  Result Date: 06/20/2021 CLINICAL DATA:  Code stroke. EXAM: CT HEAD WITHOUT CONTRAST TECHNIQUE: Contiguous axial images were obtained from the base of the skull through the vertex without intravenous contrast. RADIATION DOSE REDUCTION: This exam was performed according to the departmental dose-optimization program which includes automated exposure control, adjustment of the mA and/or kV according to patient size and/or use of iterative reconstruction technique. COMPARISON:  Prior CT from 11/02/2019. FINDINGS: Brain: Age-related cerebral atrophy with chronic small vessel ischemic disease. Small parenchymal calcific density at the posterior left frontal periventricular white matter, stable. No acute intracranial hemorrhage. No visible acute large vessel territory infarct. No mass lesion or midline shift. No hydrocephalus or extra-axial fluid collection. Vascular: No hyperdense vessel. Scattered vascular calcifications noted within the carotid siphons. Skull: Scalp soft tissues and calvarium within normal limits. Calvarium intact. Sinuses/Orbits: Globes and orbital soft tissues demonstrate no acute finding. Scattered mucosal thickening noted within the ethmoidal air cells. Paranasal sinuses are otherwise clear. Trace left mastoid effusion noted.  Other: None. ASPECTS [facility] Stroke Program Early CT Score) - Ganglionic level infarction (caudate, lentiform nuclei, internal capsule, insula, M1-M3 cortex): 7 - Supraganglionic infarction (M4-M6 cortex): 3 Total score (0-10 with 10 being normal): 10 IMPRESSION: 1. No acute intracranial abnormality. 2. ASPECTS is 10. 3. 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 These results were communicated to the nurse practitioner taking care of the patient, Sharion Settler at 3:32 am on 06/20/2021 by text page via the [facility] messaging system. Electronically Signed   By: Jeannine Boga M.D.   On: 06/20/2021 03:35    Labs:  CBC: Recent Labs    06/22/21 0255 06/23/21 0130 06/24/21 0128 06/25/21 0143  WBC 12.5* 9.9 7.8 6.8  HGB 14.3 13.7 14.0 14.5  HCT 41.2 39.3 40.6 41.4  PLT 33* 34* 34*  39*    COAGS: Recent Labs    07/16/20 1833 06/20/21 0608  INR 1.1 1.3*  APTT 35  --     BMP: Recent Labs    06/23/21 0130 06/23/21 1518 06/24/21 0128 06/25/21 0143  NA 140 139 142 140  K 3.9 4.2 4.3 4.3  CL 99 100 101 99  CO2 32 30 32 32  GLUCOSE 149* 265* 122* 145*  BUN 47* 47* 43* 42*  CALCIUM 9.1 8.8* 9.2 8.6*  CREATININE 1.64* 1.64* 1.37* 1.17*  GFRNONAA 32* 32* 39* 47*    LIVER FUNCTION TESTS: Recent Labs    06/20/21 0608 06/20/21 0654 06/21/21 0402 06/22/21 0255 06/23/21 0130  BILITOT 1.2 1.0 0.9 1.0 0.5  AST 33  --  36 33 34  ALT 18  --  21 16 26   ALKPHOS 32*  --  38 35* 41  PROT 6.4*  --  5.9* 5.3* 4.9*  ALBUMIN 3.7  --  3.4* 3.0* 2.9*    TUMOR MARKERS: No results for input(s): AFPTM, CEA, CA199, CHROMGRNA in the last 8760 hours.  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  Risks and benefits discussed with the patient including, but not limited to bleeding,  infection, vascular injury, pneumothorax which may require chest tube placement, air embolism or even death.  All of the patient's questions were answered, patient is agreeable to proceed.  Consent signed and in chart.  Thank you for this interesting consult.  I greatly enjoyed meeting [name] and look forward to participating in their care.  A copy of this report was sent to the requesting provider on this date.  Electronically Signed: Joaquim Nam, PA-C 06/25/2021, 1:54 PM   I spent a total of 20 Minutes in face to face in clinical consultation, greater than 50% of which was counseling/coordinating care for temp to tunneled CVC placement.

## 2021-06-25 NOTE — Procedures (Signed)
Interventional Radiology Procedure Note  Procedure: Left EJ tunneled pheresis/HD catheter placement  Complications: None  Estimated Blood Loss: 25 mL  Findings: Tunneled Palindrome catheter measuring 28 cm from tip to cuff placed via new access of left EJ vein. Tip in RA. OK to use.   Left EJ temp cath placed previously removed after tunneled catheter placement.  Venetia Night. Kathlene Cote, M.D Pager:  [phone]

## 2021-06-26 LAB — THERAPEUTIC PLASMA EXCHANGE (BLOOD BANK)
Plasma Exchange: 2200
Plasma volume needed: 2200
Unit division: 0
Unit division: 0
Unit division: 0

## 2021-06-26 LAB — BASIC METABOLIC PANEL
Anion gap: 7 (ref 5–15)
BUN: 35 mg/dL — ABNORMAL HIGH (ref 8–23)
CO2: 31 mmol/L (ref 22–32)
Calcium: 8.3 mg/dL — ABNORMAL LOW (ref 8.9–10.3)
Chloride: 101 mmol/L (ref 98–111)
Creatinine, Ser: 1.06 mg/dL — ABNORMAL HIGH (ref 0.44–1.00)
GFR, Estimated: 53 mL/min — ABNORMAL LOW (ref >60)
Glucose, Bld: 90 mg/dL (ref 70–99)
Potassium: 4.1 mmol/L (ref 3.5–5.1)
Sodium: 139 mmol/L (ref 135–145)

## 2021-06-26 LAB — CBC WITH DIFFERENTIAL/PLATELET
Abs Immature Granulocytes: 0.19 10*3/uL — ABNORMAL HIGH (ref 0.00–0.07)
Basophils Absolute: 0.1 10*3/uL (ref 0.0–0.1)
Basophils Relative: 1 %
Eosinophils Absolute: 0 10*3/uL (ref 0.0–0.5)
Eosinophils Relative: 0 %
HCT: 39 % (ref 36.0–46.0)
Hemoglobin: 13.4 g/dL (ref 12.0–15.0)
Immature Granulocytes: 2 %
Lymphocytes Relative: 13 %
Lymphs Abs: 1 10*3/uL (ref 0.7–4.0)
MCH: 32.5 pg (ref 26.0–34.0)
MCHC: 34.4 g/dL (ref 30.0–36.0)
MCV: 94.7 fL (ref 80.0–100.0)
Monocytes Absolute: 0.8 10*3/uL (ref 0.1–1.0)
Monocytes Relative: 10 %
Neutro Abs: 5.8 10*3/uL (ref 1.7–7.7)
Neutrophils Relative %: 74 %
Platelets: 53 10*3/uL — ABNORMAL LOW (ref 150–400)
RBC: 4.12 MIL/uL (ref 3.87–5.11)
RDW: 12.6 % (ref 11.5–15.5)
WBC: 7.9 10*3/uL (ref 4.0–10.5)
nRBC: 0 % (ref 0.0–0.2)

## 2021-06-26 LAB — LACTATE DEHYDROGENASE: LDH: 162 U/L (ref 98–192)

## 2021-06-26 MED ORDER — TRAMADOL HCL 50 MG PO TABS: 50.0000 mg | ORAL_TABLET | Freq: Four times a day (QID) | ORAL | Status: AC | PRN

## 2021-06-26 MED ORDER — ACETAMINOPHEN 325 MG PO TABS
ORAL_TABLET | ORAL | Status: AC
  Administered 2021-06-26: 650 mg via ORAL
  Filled 2021-06-26: qty 2

## 2021-06-26 MED ORDER — DIPHENHYDRAMINE HCL 25 MG PO CAPS
25.0000 mg | ORAL_CAPSULE | Freq: Four times a day (QID) | ORAL | Status: DC | PRN
  Administered 2021-06-26: 25 mg via ORAL
  Filled 2021-06-26: qty 1

## 2021-06-26 MED ORDER — CALCIUM GLUCONATE-NACL 2-0.675 GM/100ML-% IV SOLN
2.0000 g | Freq: Once | INTRAVENOUS | Status: AC
  Filled 2021-06-26: qty 100

## 2021-06-26 MED ORDER — ACD FORMULA A 0.73-2.45-2.2 GM/100ML VI SOLN
1000.0000 mL | Status: DC
  Administered 2021-06-26: 1000 mL
  Filled 2021-06-26 (×2): qty 1000

## 2021-06-26 MED ORDER — CALCIUM CARBONATE ANTACID 500 MG PO CHEW
CHEWABLE_TABLET | ORAL | Status: AC
  Administered 2021-06-26: 400 mg
  Filled 2021-06-26: qty 2

## 2021-06-26 MED ORDER — ANTICOAGULANT SODIUM CITRATE 4% (200MG/5ML) IV SOLN
5.0000 mL | Freq: Once | Status: AC
  Administered 2021-06-26: 5 mL
  Filled 2021-06-26: qty 5

## 2021-06-26 MED ORDER — PANTOPRAZOLE SODIUM 40 MG PO TBEC: 40.0000 mg | DELAYED_RELEASE_TABLET | Freq: Every day | ORAL | 0 refills | Status: DC

## 2021-06-26 MED ORDER — GABAPENTIN 600 MG PO TABS: 600.0000 mg | ORAL_TABLET | Freq: Three times a day (TID) | ORAL | Status: AC

## 2021-06-26 MED ORDER — PREDNISONE 20 MG PO TABS
60.0000 mg | ORAL_TABLET | Freq: Every day | ORAL | 0 refills | Status: DC
Start: 2021-06-26 — End: 2021-08-25

## 2021-06-26 MED ORDER — ACETAMINOPHEN 325 MG PO TABS: 650.0000 mg | ORAL_TABLET | ORAL | Status: DC | PRN

## 2021-06-26 MED ORDER — CALCIUM GLUCONATE-NACL 2-0.675 GM/100ML-% IV SOLN
INTRAVENOUS | Status: AC
  Administered 2021-06-26: 2000 mg via INTRAVENOUS
  Filled 2021-06-26: qty 100

## 2021-06-26 NOTE — TOC Initial Note (Addendum)
Transition of Care [facility]) - Initial/Assessment Note    Patient Details  Name: [name] MRN: [id number] Date of Birth: Feb 06, 1941  Transition of Care [facility] CM/SW Contact:    Carles Collet, RN Phone Number: 06/26/2021, 11:23 AM  Clinical Narrative:        Damaris Schooner w patient at bedside re DC planning. Discussed recs for [facility] PT.  She is agree to referral. Used [facility] last summer, requests to try with them first and if they are unable to accept she does not have a preference. Referral made to hospital liaison and awaiting confirmation. [facility] - declined [facility]- accepted, added RN for start of care assessment Monday for medication and disease management             Expected Discharge Plan: [place] Barriers to Discharge: Continued Medical Work up   Patient Goals and CMS Choice Patient states their goals for this hospitalization and ongoing recovery are:: to go home CMS Medicare.gov Compare Post Acute Care list provided to:: Patient Choice offered to / list presented to : Patient  Expected Discharge Plan and Services Expected Discharge Plan: [place]   Discharge Planning Services: CM Consult Post Acute Care Choice: [place] arrangements for the past 2 months: Single Family Home                           HH Arranged: PT HH Agency: [facility] Date [place]: 06/26/21 Time HH Agency Contacted: 1122 Representative spoke with at [place]: [facility] Arrangements/Services Living arrangements for the past 2 months: [place] with:: Spouse                   Activities of Daily Living Home Assistive Devices/Equipment: None ADL Screening (condition at time of admission) Patient's cognitive ability adequate to safely complete daily activities?: Yes Is the patient deaf or have difficulty hearing?: Yes Does the patient have difficulty seeing, even when wearing  glasses/contacts?: Yes Does the patient have difficulty concentrating, remembering, or making decisions?: Yes Patient able to express need for assistance with ADLs?: No Does the patient have difficulty dressing or bathing?: Yes Independently performs ADLs?: No Communication: Independent Dressing (OT): Needs assistance Is this a change from baseline?: Pre-admission baseline Grooming: Needs assistance Is this a change from baseline?: Pre-admission baseline Feeding: Independent Bathing: Needs assistance Is this a change from baseline?: Pre-admission baseline In/Out Bed: Needs assistance Is this a change from baseline?: Pre-admission baseline Walks in Home: Independent Does the patient have difficulty walking or climbing stairs?: No Weakness of Legs: Left Weakness of Arms/Hands: Left  Permission Sought/Granted                  Emotional Assessment              Admission diagnosis:  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 syndrome ([place]) [M31.19] Patient Active Problem List   Diagnosis Date Noted   Acute left-sided weakness 06/20/2021   AKI (acute kidney injury) ([place]) 06/20/2021   T.T.P. syndrome ([place]) 06/20/2021   Acute pyelonephritis 06/19/2021   Nicotine dependence 06/19/2021   Nephrolithiasis 06/19/2021   Adnexal cyst 09/25/2020   Aortic atherosclerosis ([place]) 09/25/2020   Acute blood loss anemia 09/24/2020   Chronic pain disorder 07/16/2020   Mood disorder ([place]) 07/16/2020   Abnormal laboratory test 09/10/2013   Pulmonary edema 08/19/2013   Bronchitis, chronic obstructive w  acute bronchitis ([place]) 08/12/2013   Fever 08/12/2013   Oral thrush 08/08/2013   Hypokalemia 08/08/2013   History of plasmapheresis 08/08/2013   TTP (thrombotic thrombocytopenic purpura) 07/05/2013   Tobacco abuse 05/10/2013   Enterococcal sepsis ([place]) 06/13/2011   Endocarditis, bacterial, acute/subacute 06/13/2011   Gait instability 06/13/2011   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 [place] [address] Phone: [phone] Fax: [phone]  [place], [place] [address] [place] [address] Phone: [phone] Fax: [phone]  [place] [address] Phone: [phone] Fax: [phone]  [facility] [address] [place] [address] Phone: [phone] Fax: [phone]     Social Determinants of Health (SDOH) Interventions    Readmission Risk Interventions No flowsheet data found.

## 2021-06-26 NOTE — Progress Notes (Signed)
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  She was seen during her plasmapheresis today.  She notes no acute new symptoms overnight.  All her focal neurological symptoms have completely resolved and there were no other new symptoms at this time. She had her tunneled hemodialysis catheter placed yesterday. Labs today show normal hemoglobin of 13.4, WBC count of 7.9k and platelets that have improved to 53k from a low of 16k. BMP shows improving creatinine now down to 1.06 from a peak of 2.24. LDH has normalized this morning at 162. Patient is tolerating her plasmapheresis without any significant issues. She notes that she is very keen to go home today.  Hematologic history from Dr. 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, steroids, rituximab Relapse of TTP March 2015-plasma exchange, steroids, rituximab, and maintenance azathioprine Admission with severe thrombocytopenia and elevated LDH 07/16/2020, ADAMTS13 activity less than 2%, consistent with relapse of TTP       -Steroids/FFP given 07/16/2020       -Daily plasma exchange beginning 07/17/2020, 7 days, then qod starting 3/24, discharge 07/21/2020 on prednisone 60 mg daily       -Prednisone taper to 40 mg daily 07/29/2020       -rituximab 07/30/2020, 08/06/2020, 08/13/2020, 08/20/2020       -plasmapheresis on 07/31/2020, 08/03/2020, 08/05/2020, and 08/07/2020       -prednisone increased to 60 mg daily 08/17/2020       -Plasmapheresis 08/24/2020, 08/25/2020, 08/26/2020, 08/27/2020, 08/28/2020, 08/29/2020, 08/31/2020, 09/02/2020, 09/04/2020       -caplacizumab initiated 08/24/2020       -Decrease prednisone to 40 mg daily 08/28/2020       -ADAMTS13 37% on 09/04/2020       -Prednisone decreased to 30 mg daily 09/07/2020        -Continue caplacizumab       -prednisone decreased to 20 mg daily beginning 09/18/2020       -Cablivi discontinued 09/21/2020       -prednisone discontinued 09/24/2020, resumed at a dose of 20 mg daily on 10/01/2020       -prednisone taper to 10 mg daily 10/23/2020       -Prednisone taper to 5 mg daily 11/06/2020       -prednisone continued at a dose of 5 mg daily, Imuran resumed at 50 mg daily 12/03/2020       - admission has 06/19/2021 with relapse of TTP, daily plasma exchange and Solu-Medrol beginning 06/20/2021, 1 dose of caplacizumab 06/20/2021  OBJECTIVE:  NAD  PHYSICAL EXAMINATION: . Vitals:   06/26/21 1112 06/26/21 1128 06/26/21 1138 06/26/21 1150  BP: (!) 155/57 (!) 155/50 (!) 148/55 (!) 132/56  Pulse: 70 74 72 75  Resp: 19 19 18 18   Temp: 98.6 F (37 C) 98.4 F (36.9 C) 98.6 F (37 C) 98.4 F (36.9 C)  TempSrc:      SpO2:      Weight:      Height:       Filed Weights   06/25/21 1234 06/26/21 0505 06/26/21 1101  Weight: 180 lb 12.4 oz (82 kg) 179 lb 7.3 oz (81.4 kg) 180 lb 12.4 oz (82 kg)   .Body mass index is 31.03 kg/m.  GENERAL:alert, in no acute distress and comfortable OROPHARYNX:no exudate, no erythema and lips, buccal mucosa,  and tongue normal  NECK: supple, no JVD LYMPH:  no palpable lymphadenopathy in the cervical, axillary or inguinal LUNGS: clear to auscultation with normal respiratory effort HEART: regular rate & rhythm RRR ABDOMEN: abdomen soft, non-tender, normoactive bowel sounds  PSYCH: alert & oriented x 3 with fluent speech NEURO: no focal motor/sensory deficits  MEDICAL HISTORY:  Past Medical History:  Diagnosis Date   Abnormal laboratory test 09/10/2013   Persistent elevation LDH   Acute delirium    Adnexal cyst 09/25/2020   Anemia    Arthritis    Bacteremia 03/2011   Blood dyscrasia    Blood transfusion    Bronchitis, chronic obstructive w acute bronchitis (HCC) 08/12/2013   Chronic back pain    COPD (chronic obstructive pulmonary  disease) (HCC)    Depression    "mild"   Endocarditis    Epidural abscess    Heart murmur    History of plasmapheresis 08/08/2013   Active for 3rd relapse of TTP   History of TTP (thrombotic thrombocytopenic purpura)    Hypokalemia 08/08/2013   Steroid related   Normal echocardiogram 03/15/2011   Oral thrush 08/08/2013   Septicemia 03/2011   Spinal stenosis, lumbar    Tobacco abuse 05/10/2013    SURGICAL HISTORY: Past Surgical History:  Procedure Laterality Date   BACK SURGERY     BLADDER INSTILLATION N/A 06/18/2021   Procedure: BLADDER INSTILLATION OF GEMCITABINE;  Surgeon: Billey Co, MD;  Location: [facility];  Service: Urology;  Laterality: N/A;   CATARACT EXTRACTION W/ INTRAOCULAR LENS  IMPLANT, BILATERAL  ~ 2010   CESAREAN SECTION  10/09/1976   CYSTOSCOPY/URETEROSCOPY/HOLMIUM LASER/STENT PLACEMENT Right 06/18/2021   Procedure: CYSTOSCOPY/URETEROSCOPY/HOLMIUM LASER/STENT PLACEMENT;  Surgeon: Billey Co, MD;  Location: [facility];  Service: Urology;  Laterality: Right;   ESOPHAGOGASTRODUODENOSCOPY (EGD) WITH PROPOFOL N/A 09/27/2020   Procedure: ESOPHAGOGASTRODUODENOSCOPY (EGD) WITH PROPOFOL;  Surgeon: Ronald Lobo, MD;  Location: [facility];  Service: Endoscopy;  Laterality: N/A;   EYE SURGERY     INSERTION OF DIALYSIS CATHETER Left 07/12/2013   Procedure: INSERTION OF DIALYSIS CATHETER   with Ultrasound;  Surgeon: Rosetta Posner, MD;  Location: [place];  Service: Vascular;  Laterality: Left;   IR FLUORO GUIDE CV LINE LEFT  06/20/2021   IR FLUORO GUIDE CV LINE RIGHT  06/25/2021   IR PERC TUN PERIT CATH WO PORT S&I /IMAG  07/16/2020   IR RADIOLOGIST EVAL & MGMT  09/26/2020   IR US GUIDE VASC ACCESS LEFT  07/16/2020   IR US GUIDE VASC ACCESS LEFT  06/20/2021   IR US GUIDE VASC ACCESS LEFT  06/25/2021   LUMBAR LAMINECTOMY/DECOMPRESSION MICRODISCECTOMY  03/16/2011   Procedure: LUMBAR LAMINECTOMY/DECOMPRESSION MICRODISCECTOMY;  Surgeon: Elaina Hoops;  Location: [place];   Service: Neurosurgery;  Laterality: N/A;   TEE WITHOUT CARDIOVERSION  03/21/2011   Procedure: TRANSESOPHAGEAL ECHOCARDIOGRAM (TEE);  Surgeon: Laverda Page;  Location: [facility];  Service: Cardiovascular;  Laterality: N/A;  TEE for vegetations   TONSILLECTOMY     TRANSURETHRAL RESECTION OF BLADDER TUMOR N/A 06/18/2021   Procedure: TRANSURETHRAL RESECTION OF BLADDER TUMOR (TURBT);  Surgeon: Billey Co, MD;  Location: [facility];  Service: Urology;  Laterality: N/A;   TUBAL LIGATION      SOCIAL HISTORY: Social History   Socioeconomic History   Marital status: Married    Spouse name: Not on file   Number of children: Not on file   Years of education: Not on file   Highest education  level: Not on file  Occupational History   Not on file  Tobacco Use   Smoking status: Every Day    Packs/day: 3.00    Years: 55.00    Pack years: 165.00    Types: Cigarettes    Passive exposure: Never   Smokeless tobacco: Never   Tobacco comments:    Vapors  Vaping Use   Vaping Use: Every day  Substance and Sexual Activity   Alcohol use: Yes    Alcohol/week: 7.0 standard drinks    Types: 7 Cans of beer per week    Comment: Beer a night.   Drug use: No   Sexual activity: Not Currently    Partners: Male  Other Topics Concern   Not on file  Social History Narrative   Not on file   Social Determinants of Health   Financial Resource Strain: Not on file  Food Insecurity: Not on file  Transportation Needs: Not on file  Physical Activity: Not on file  Stress: Not on file  Social Connections: Not on file  Intimate Partner Violence: Not on file    FAMILY HISTORY: Family History  Problem Relation Age of Onset   Heart disease Mother    Leukemia Sister    Breast cancer Neg Hx    Colon cancer Neg Hx    Ovarian cancer Neg Hx    Uterine cancer Neg Hx    Cervical cancer Neg Hx     ALLERGIES:  is allergic to adhesive [tape], cefuroxime axetil, cefuroxime axetil, pedi-pre tape spray  [wound dressing adhesive], other, sulfa antibiotics, and sulfa drugs cross reactors.  MEDICATIONS:  Scheduled Meds:  Chlorhexidine Gluconate Cloth  6 each Topical Daily   cholecalciferol  400 Units Oral Daily   FLUoxetine  20 mg Oral Daily   folic acid  1 mg Oral Daily   latanoprost  1 drop Both Eyes QHS   methylPREDNISolone sodium succinate  80 mg Intravenous Daily   multivitamin with minerals  1 tablet Oral Daily   nicotine  21 mg Transdermal Daily   pantoprazole  40 mg Oral Q1200   QUEtiapine  25 mg Oral QHS   rosuvastatin  20 mg Oral QHS   tiotropium  18 mcg Inhalation Daily   cyanocobalamin  1,000 mcg Oral Daily   Continuous Infusions:  anticoagulant sodium citrate     calcium gluconate 2,000 mg (06/26/21 1046)   citrate dextrose     PRN Meds:.acetaminophen, diphenhydrAMINE, gabapentin, HYDROcodone-acetaminophen, polyvinyl alcohol  REVIEW OF SYSTEMS:    10 Point review of Systems was done is negative except as noted above.   LABORATORY DATA:  I have reviewed the data as listed  . CBC Latest Ref Rng & Units 06/26/2021 06/25/2021 06/24/2021  WBC 4.0 - 10.5 K/uL 7.9 6.8 7.8  Hemoglobin 12.0 - 15.0 g/dL 13.4 14.5 14.0  Hematocrit 36.0 - 46.0 % 39.0 41.4 40.6  Platelets 150 - 400 K/uL 53(L) 39(L) 34(L)    . CMP Latest Ref Rng & Units 06/26/2021 06/25/2021 06/24/2021  Glucose 70 - 99 mg/dL 90 145(H) 122(H)  BUN 8 - 23 mg/dL 35(H) 42(H) 43(H)  Creatinine 0.44 - 1.00 mg/dL 1.06(H) 1.17(H) 1.37(H)  Sodium 135 - 145 mmol/L 139 140 142  Potassium 3.5 - 5.1 mmol/L 4.1 4.3 4.3  Chloride 98 - 111 mmol/L 101 99 101  CO2 22 - 32 mmol/L 31 32 32  Calcium 8.9 - 10.3 mg/dL 8.3(L) 8.6(L) 9.2  Total Protein 6.5 - 8.1 g/dL - - -  Total Bilirubin 0.3 - 1.2 mg/dL - - -  Alkaline Phos 38 - 126 U/L - - -  AST 15 - 41 U/L - - -  ALT 0 - 44 U/L - - -   . Lab Results  Component Value Date   LDH 162 06/26/2021     RADIOGRAPHIC STUDIES: I have personally reviewed the radiological  images as listed and agreed with the findings in the report. CT ABDOMEN PELVIS WO CONTRAST  Result Date: 06/19/2021 CLINICAL DATA:  Flank pain, kidney stone suspected EXAM: CT ABDOMEN AND PELVIS WITHOUT CONTRAST TECHNIQUE: Multidetector CT imaging of the abdomen and pelvis was performed following the standard protocol without IV contrast. RADIATION DOSE REDUCTION: This exam was performed according to the departmental dose-optimization program which includes automated exposure control, adjustment of the mA and/or kV according to patient size and/or use of iterative reconstruction technique. COMPARISON:  CT 05/31/2021 FINDINGS: Lower chest: Bibasilar atelectasis. There are 5 mm nodules in the peripheral lung bases (series 3, image 4). Hepatobiliary: No focal liver abnormality is seen. Possible tiny hyperdense stones in the gallbladder fundus. Gallbladder is nondilated. Pancreas: Unremarkable. No pancreatic ductal dilatation or surrounding inflammatory changes. Spleen: Normal in size without focal abnormality. Adrenals/Urinary Tract: Adrenal glands are unremarkable. Punctate nonobstructive left lower pole renal stone. No left-sided hydronephrosis. Unchanged left lower pole cyst. Right-sided hydroureteronephrosis with perinephric and periureteral stranding. There is a small focus of gas within the right renal pelvis (series 2, image 30) likely related to recent retrograde pyelogram. Unchanged right renal cysts. There is no visible obstructing stone in the ureter. The previously seen 3 mm stone in the distal right ureter is no longer visible distant with recent ureteroscopy and lithotripsy. There is no stent in the ureter present. There is gas in the bladder consistent with recent procedure. Stomach/Bowel: Small hiatal hernia. The stomach is otherwise within normal limits. There is no evidence of bowel obstruction. The appendix is normal. There are few sigmoid diverticula. Vascular/Lymphatic: Aortoiliac  atherosclerotic calcifications. No AAA. No lymphadenopathy. Reproductive: There is a 4.9 x 4.0 x 6.0 cm left adnexal structure with intermediate density, unchanged from prior exam. Other: No bowel containing hernia. No abdominopelvic ascites. No free air. Musculoskeletal: Unchanged chronic T12 compression deformity with 25% height loss anteriorly. Multilevel degenerative changes of the spine with severe degenerative disc disease and facet arthropathy in the lumbar spine. IMPRESSION: Right-sided hydroureteronephrosis with perinephric and perinephric inflammatory stranding. No obstructing stone is visible within the ureter. Small focus of gas within the right renal pelvis, likely related to recent retrograde pyelogram. There is no stent in the ureter. Recommend urology consultation. Unchanged intermediate density left adnexal lesion measuring up to 6.0 cm, which could be a hemorrhagic cyst. Given the patient is postmenopausal, nonemergent pelvic ultrasound is recommended for further evaluation if not yet performed. Two incidental 5 mm pulmonary nodules in the peripheral lung bases. No follow-up needed if patient is low-risk (and has no known or suspected primary neoplasm). Non-contrast chest CT can be considered in 12 months if patient is high-risk. This recommendation follows the consensus statement: Guidelines for Management of Incidental Pulmonary Nodules Detected on CT Images: From the Fleischner Society 2017; Radiology 2017; 284:228-243. Electronically Signed   By: Maurine Simmering M.D.   On: 06/19/2021 09:45   CT ANGIO HEAD NECK W WO CM  Result Date: 06/20/2021 CLINICAL DATA:  81 year old female code stroke presentation. Left side weakness. EXAM: CT ANGIOGRAPHY HEAD AND NECK TECHNIQUE: Multidetector CT imaging of the head and neck  was performed using the standard protocol during bolus administration of intravenous contrast. Multiplanar CT image reconstructions and MIPs were obtained to evaluate the vascular  anatomy. Carotid stenosis measurements (when applicable) are obtained utilizing NASCET criteria, using the distal internal carotid diameter as the denominator. RADIATION DOSE REDUCTION: This exam was performed according to the departmental dose-optimization program which includes automated exposure control, adjustment of the mA and/or kV according to patient size and/or use of iterative reconstruction technique. CONTRAST:  10mL OMNIPAQUE IOHEXOL 350 MG/ML SOLN COMPARISON:  Plain head CT 0318 hours. FINDINGS: CTA NECK Skeleton: Osteopenia. Chronic lower cervical spine disc and endplate degeneration. Multilevel mild upper thoracic compression fractures, such as T3 and T4, appears stable from a 2021 cervical spine CT. No acute osseous abnormality identified. Upper chest: Mild dependent atelectasis. Mild retained secretions in the proximal thoracic esophagus which is nondilated. No visible mediastinal lymphadenopathy. Visible central pulmonary arteries appear patent. Other neck: Right supraclavicular venous collaterals appear incidental. Retropharyngeal course of both carotids (normal variant). No acute neck soft tissue finding. Aortic arch: Mild Calcified aortic atherosclerosis. Three vessel arch configuration. Right carotid system: Mild brachiocephalic artery calcified plaque without stenosis. Normal right CCA origin. Tortuous right CCA with a retropharyngeal carotid bifurcation. Mild right ICA origin and bulb calcified plaque without stenosis. Left carotid system: Negative left CCA origin. Tortuous left CCA with a retropharyngeal course similar to the right. Mild calcified plaque at the left ICA origin and bulb without stenosis. Vertebral arteries: Mild calcified plaque in the proximal right subclavian artery without stenosis. Minimal calcified plaque at the right vertebral artery origin without stenosis. Right vertebral artery appears mildly non dominant and patent to the skull base without stenosis. Mild proximal  left subclavian artery calcified plaque without stenosis. Normal left vertebral artery origin. Dominant left vertebral artery is tortuous in the V2 segment and patent to the skull base without plaque or stenosis. CTA HEAD Posterior circulation: Normal distal vertebral arteries and vertebrobasilar junction. Dominant left V4 segment. Normal PICA origins. Patent basilar artery without stenosis. Normal SCA and left PCA origin. Fetal type right PCA origin. Left posterior communicating artery diminutive or absent. Bilateral PCA branches are within normal limits. Anterior circulation: Both ICA siphons are patent and tortuous. Mild to moderate calcified plaque on the left but only mild left siphon stenosis. Similar calcified plaque on the right with no significant stenosis. Normal right posterior communicating artery origin. Small infundibulum of the distal left ICA (normal variant). Patent carotid termini. Normal MCA and ACA origins. Normal anterior communicating artery. Bilateral ACA branches are within normal limits. Left MCA M1 segment and bifurcation are patent without stenosis. Right MCA M1 segment and bifurcation are patent without stenosis. Bilateral MCA branches are within normal limits. Venous sinuses: Patent. Anatomic variants: Dominant left vertebral artery. Fetal type right PCA origin. Review of the MIP images confirms the above findings IMPRESSION: 1. Negative for large vessel occlusion. And generally mild for age atherosclerosis in the head and neck, most pronounced at the ICA siphons. No significant arterial stenosis. 2. Aortic Atherosclerosis (ICD10-I70.0). 3. Osteopenia with multiple mild chronic upper thoracic compression fractures. Electronically Signed   By: Genevie Ann M.D.   On: 06/20/2021 04:16   MR BRAIN WO CONTRAST  Result Date: 06/20/2021 CLINICAL DATA:  81 year old female code stroke presentation. Left side weakness. EXAM: MRI HEAD WITHOUT CONTRAST TECHNIQUE: Multiplanar, multiecho pulse  sequences of the brain and surrounding structures were obtained without intravenous contrast. COMPARISON:  Brain MRI 09/14/2012. FINDINGS: The examination had to be discontinued  prior to completion due to patient back pain. Only DWI, axial T2* and FLAIR imaging was obtained. Mildly motion degraded axial and coronal DWI imaging demonstrates multifocal, widely scattered, generally small foci of restricted diffusion in the bilateral cerebral and cerebellar hemispheres. Most confluent area is in the right centrum semiovale near the pre motor area on series 6, image 40. Essentially all vascular territories affected. Deep gray matter nuclei and brainstem are spared. No intracranial mass effect. Stable ventricle size and configuration. Basilar cisterns appear normal. Patchy additional bilateral cerebral white matter and central pontine FLAIR heterogeneity appears increased since 2014. No evidence of acute or chronic intracranial hemorrhage. IMPRESSION: 1. Small acute infarcts - most punctate - scattered throughout the bilateral cerebral and cerebellar hemispheres in keeping with a recent Embolic event from the heart or proximal aorta. Largest area of involvement is in the right frontal lobe pre-motor white matter area. No associated mass effect or hemorrhage. 2. Truncated exam, only three imaging sequences obtained. Electronically Signed   By: Genevie Ann M.D.   On: 06/20/2021 08:30   US RENAL  Result Date: 06/22/2021 CLINICAL DATA:  Renal dysfunction EXAM: RENAL / URINARY TRACT ULTRASOUND COMPLETE COMPARISON:  CT done on 06/19/2021 FINDINGS: Right Kidney: Renal measurements: 9.7 x 5.1 x 4.5 cm = volume: 117.7 mL. There is no hydronephrosis. There is interval resolution of right hydronephrosis. There is 2.4 cm cyst in the upper pole. There is slightly increased cortical echogenicity. There is no perinephric fluid collection. Left Kidney: Renal measurements: 9.8 x 5.1 x 4.9 cm = volume: 126.9 mL. There is no hydronephrosis.  There is slightly increased cortical echogenicity. Bladder: Layering low-level echoes are noted in the dependent portion of bladder lumen. Other: None. IMPRESSION: There is no hydronephrosis. Right renal cyst. Increased cortical echogenicity may suggest medical renal disease. Small amount of debris is seen in the dependent portion of urinary bladder lumen, possibly blood products. Electronically Signed   By: Elmer Picker M.D.   On: 06/22/2021 17:02   IR Fluoro Guide CV Line Left  Result Date: 06/20/2021 INDICATION: 81 year old with TTP.  Patient needs plasmapheresis. EXAM: FLUOROSCOPIC AND ULTRASOUND GUIDED PLACEMENT OF A NON-TUNNELED DIALYSIS CATHETER Physician: Stephan Minister. Henn, MD MEDICATIONS: 1% lidocaine ANESTHESIA/SEDATION: Local anesthetic FLUOROSCOPY TIME:  Radiation Exposure Index (as provided by the fluoroscopic device): 9 mGy Kerma COMPLICATIONS: None immediate. PROCEDURE: Informed consent was obtained for catheter placement. The patient was placed supine on the interventional table. Both left and right internal jugular veins appear to be occluded with ultrasound. Left external jugular vein was patent. Ultrasound was used for the procedure but an image was not saved. The left neck was prepped and draped in a sterile fashion. The left neck was anesthetized with 1% lidocaine. Maximal barrier sterile technique was utilized including caps, mask, sterile gowns, sterile gloves, sterile drape, hand hygiene and skin antiseptic. A small incision was made with #11 blade scalpel. A 21 gauge needle directed into the left external jugular vein with ultrasound guidance. A micropuncture dilator set was placed. A 24 cm Mahurkar catheter was selected. The catheter was advanced over a wire and positioned at the superior cavoatrial junction. Fluoroscopic images were obtained for documentation. Both dialysis lumens were found to aspirate and flush well. The proper amount of heparin was flushed in both lumens. The  central venous lumen was flushed with normal saline. Catheter was sutured to skin. FINDINGS: Catheter tip at the superior cavoatrial junction. Left and right internal jugular veins appear to be occluded in the  lower neck. Catheter was placed through the left external jugular vein. IMPRESSION: Successful placement of a left external jugular non-tunneled dialysis catheter using ultrasound and fluoroscopic guidance. Electronically Signed   By: Markus Daft M.D.   On: 06/20/2021 20:51   IR Fluoro Guide CV Line Right  Result Date: 06/25/2021 CLINICAL DATA:  History of TTP and status post non tunneled pheresis catheter placement on 06/20/2021 via left external jugular vein. The patient now requires placement of a tunneled catheter for longer-term plasmapheresis. EXAM: TUNNELED CENTRAL VENOUS PHERESIS/HEMODIALYSIS CATHETER PLACEMENT WITH ULTRASOUND AND FLUOROSCOPIC GUIDANCE ANESTHESIA/SEDATION: Moderate (conscious) sedation was employed during this procedure. A total of Versed 1.5 mg and Fentanyl 75 mcg was administered intravenously by radiology nursing. Moderate Sedation Time: 38 minutes. The patient's level of consciousness and vital signs were monitored continuously by radiology nursing throughout the procedure under my direct supervision. MEDICATIONS: 1 g IV vancomycin FLUOROSCOPY: 3 minutes and 24 seconds.  24.0 mGy. PROCEDURE: The procedure, risks, benefits, and alternatives were explained to the patient. Questions regarding the procedure were encouraged and answered. The patient understands and consents to the procedure. A timeout was performed prior to initiating the procedure. The left neck and chest were prepped with chlorhexidine in a sterile fashion, and a sterile drape was applied covering the operative field. Maximum barrier sterile technique with sterile gowns and gloves were used for the procedure. Local anesthesia was provided with 1% lidocaine. After creating a small venotomy incision, a 21 gauge  needle was advanced into the left external jugular vein under direct, real-time ultrasound guidance. Ultrasound image documentation was performed with a permanent recorded ultrasound image. After securing guidewire access a 5 French catheter was advanced to assist in advancing guidewire access to the level of the SVC. A wire was kinked to measure appropriate catheter length. A Palindrome tunneled hemodialysis catheter measuring 28 cm from tip to cuff was chosen for placement. This was tunneled in a retrograde fashion from the chest wall to the venotomy incision. At the venotomy, serial dilatation was performed and a 15 Fr peel-away sheath was placed over a guidewire. The catheter was then placed through the sheath and the sheath removed. Final catheter positioning was confirmed and documented with a fluoroscopic spot image. The catheter was aspirated, flushed with saline, and injected with appropriate volume sodium citrate dwells. The venotomy incision was closed with subcuticular 4-0 Vicryl. Dermabond was applied to the incision. The catheter exit site was secured with 0-Prolene retention sutures. After tunnel catheter placement, the non tunneled temporary pheresis catheter was removed and manual compression held over the catheter exit site. COMPLICATIONS: None.  No pneumothorax. 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 After catheter placement, the tip lies in the right atrium. The catheter aspirates normally and is ready for immediate use. IMPRESSION: Placement of tunneled hemodialysis catheter via the left external jugular vein. The catheter tip lies in the right atrium. The catheter is ready for immediate use. The temporary non tunneled pheresis catheter was removed after new tunnel catheter placement. Electronically Signed   By: Aletta Edouard M.D.   On: 06/25/2021 16:45   IR US Guide Vasc Access  Left  Result Date: 06/25/2021 CLINICAL DATA:  History of TTP and status post non tunneled pheresis catheter placement on 06/20/2021 via left external jugular vein. The patient now requires placement of a tunneled catheter for longer-term plasmapheresis. EXAM: TUNNELED CENTRAL VENOUS PHERESIS/HEMODIALYSIS CATHETER PLACEMENT WITH ULTRASOUND AND  FLUOROSCOPIC GUIDANCE ANESTHESIA/SEDATION: Moderate (conscious) sedation was employed during this procedure. A total of Versed 1.5 mg and Fentanyl 75 mcg was administered intravenously by radiology nursing. Moderate Sedation Time: 38 minutes. The patient's level of consciousness and vital signs were monitored continuously by radiology nursing throughout the procedure under my direct supervision. MEDICATIONS: 1 g IV vancomycin FLUOROSCOPY: 3 minutes and 24 seconds.  24.0 mGy. PROCEDURE: The procedure, risks, benefits, and alternatives were explained to the patient. Questions regarding the procedure were encouraged and answered. The patient understands and consents to the procedure. A timeout was performed prior to initiating the procedure. The left neck and chest were prepped with chlorhexidine in a sterile fashion, and a sterile drape was applied covering the operative field. Maximum barrier sterile technique with sterile gowns and gloves were used for the procedure. Local anesthesia was provided with 1% lidocaine. After creating a small venotomy incision, a 21 gauge needle was advanced into the left external jugular vein under direct, real-time ultrasound guidance. Ultrasound image documentation was performed with a permanent recorded ultrasound image. After securing guidewire access a 5 French catheter was advanced to assist in advancing guidewire access to the level of the SVC. A wire was kinked to measure appropriate catheter length. A Palindrome tunneled hemodialysis catheter measuring 28 cm from tip to cuff was chosen for placement. This was tunneled in a retrograde  fashion from the chest wall to the venotomy incision. At the venotomy, serial dilatation was performed and a 15 Fr peel-away sheath was placed over a guidewire. The catheter was then placed through the sheath and the sheath removed. Final catheter positioning was confirmed and documented with a fluoroscopic spot image. The catheter was aspirated, flushed with saline, and injected with appropriate volume sodium citrate dwells. The venotomy incision was closed with subcuticular 4-0 Vicryl. Dermabond was applied to the incision. The catheter exit site was secured with 0-Prolene retention sutures. After tunnel catheter placement, the non tunneled temporary pheresis catheter was removed and manual compression held over the catheter exit site. COMPLICATIONS: None.  No pneumothorax. 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 After catheter placement, the tip lies in the right atrium. The catheter aspirates normally and is ready for immediate use. IMPRESSION: Placement of tunneled hemodialysis catheter via the left external jugular vein. The catheter tip lies in the right atrium. The catheter is ready for immediate use. The temporary non tunneled pheresis catheter was removed after new tunnel catheter placement. Electronically Signed   By: Aletta Edouard M.D.   On: 06/25/2021 16:45   IR US Guide Vasc Access Left  Result Date: 06/20/2021 INDICATION: 81 year old with TTP.  Patient needs plasmapheresis. EXAM: FLUOROSCOPIC AND ULTRASOUND GUIDED PLACEMENT OF A NON-TUNNELED DIALYSIS CATHETER Physician: Stephan Minister. Henn, MD MEDICATIONS: 1% lidocaine ANESTHESIA/SEDATION: Local anesthetic FLUOROSCOPY TIME:  Radiation Exposure Index (as provided by the fluoroscopic device): 9 mGy Kerma COMPLICATIONS: None immediate. PROCEDURE: Informed consent was obtained for catheter placement. The patient was placed supine on the interventional  table. Both left and right internal jugular veins appear to be occluded with ultrasound. Left external jugular vein was patent. Ultrasound was used for the procedure but an image was not saved. The left neck was prepped and draped in a sterile fashion. The left neck was anesthetized with 1% lidocaine. Maximal barrier sterile technique was utilized including caps, mask, sterile gowns, sterile gloves, sterile drape, hand hygiene and skin antiseptic. A small incision was  made with #11 blade scalpel. A 21 gauge needle directed into the left external jugular vein with ultrasound guidance. A micropuncture dilator set was placed. A 24 cm Mahurkar catheter was selected. The catheter was advanced over a wire and positioned at the superior cavoatrial junction. Fluoroscopic images were obtained for documentation. Both dialysis lumens were found to aspirate and flush well. The proper amount of heparin was flushed in both lumens. The central venous lumen was flushed with normal saline. Catheter was sutured to skin. FINDINGS: Catheter tip at the superior cavoatrial junction. Left and right internal jugular veins appear to be occluded in the lower neck. Catheter was placed through the left external jugular vein. IMPRESSION: Successful placement of a left external jugular non-tunneled dialysis catheter using ultrasound and fluoroscopic guidance. Electronically Signed   By: Markus Daft M.D.   On: 06/20/2021 20:51   DG OR UROLOGY CYSTO IMAGE ([facility] ONLY)  Result Date: 06/18/2021 There is no interpretation for this exam.  This order is for images obtained during a surgical procedure.  Please See "Surgeries" Tab for more information regarding the procedure.   CT HEMATURIA WORKUP  Result Date: 06/01/2021 CLINICAL DATA:  Gross hematuria in a 81 year old female. EXAM: CT ABDOMEN AND PELVIS WITHOUT AND WITH CONTRAST TECHNIQUE: Multidetector CT imaging of the abdomen and pelvis was performed following the standard protocol before and  following the bolus administration of intravenous contrast. RADIATION DOSE REDUCTION: This exam was performed according to the departmental dose-optimization program which includes automated exposure control, adjustment of the mA and/or kV according to patient size and/or use of iterative reconstruction technique. CONTRAST:  137mL OMNIPAQUE IOHEXOL 300 MG/ML  SOLN COMPARISON:  Sep 24, 2020. FINDINGS: Lower chest: Incidental imaging of the lung bases without sign of effusion or consolidative change. 5 mm LEFT upper lobe pulmonary nodule (image 2/9) not imaged on previous imaging studies. No consolidation. No pleural effusion. Heart size unremarkable though incompletely imaged. Hepatobiliary: No focal, suspicious hepatic lesion. No pericholecystic stranding. No biliary duct dilation. Portal vein is patent. Pancreas: Normal, without mass, inflammation or ductal dilatation. Spleen: Normal. Adrenals/Urinary Tract: Adrenal glands are normal. Cortical scarring of the bilateral kidneys without signs of hydronephrosis or perinephric stranding. Tiny intrarenal calculus in the interpolar RIGHT kidney measures 2-3 mm. Bilateral renal cysts are noted not substantially changed from prior imaging. No abnormal enhancement along the course of the LEFT or RIGHT ureter but with a small distal RIGHT ureteral calculus (image 64/7) this measures 3 mm. Subtle nodularity is present at the LEFT bladder base (image 72/7) 5 mm. Urinary bladder is collapsed which does limit assessment. Filling defect as well as enhancing area seen at the LEFT bladder base near the UVJ (image 68/13 in addition to the above referenced image. Stomach/Bowel: Normal appendix.  No acute gastrointestinal findings. Vascular/Lymphatic: Aortic atherosclerosis. No sign of aneurysm. Smooth contour of the IVC. There is no gastrohepatic or hepatoduodenal ligament lymphadenopathy. No retroperitoneal or mesenteric lymphadenopathy. No pelvic sidewall lymphadenopathy.  Reproductive: Lobulated LEFT adnexal structure with low attenuation but with intermediate density, 39 Hounsfield units measuring 4.3 x 4.2 x 6.3 cm previously 4.8 x 4.6 x 6.0 cm. This does measure greatest craniocaudal dimension. RIGHT adnexa unremarkable by CT. Other: No ascites. Musculoskeletal: No acute bone finding. No destructive bone process. Spinal degenerative changes. Excretory phase: Filling defect of the LEFT posterolateral urinary bladder base near the UVJ as described no visible upper tract lesion. IMPRESSION: 1. Filling defect with enhancement in the LEFT posterolateral urinary bladder base near the  LEFT UVJ suspicious for small urothelial neoplasm. Cystoscopy is advised. 2. 3 mm distal RIGHT ureteral calculus without current signs of obstruction. 3. Bilateral renal cysts. 4. 5 mm LEFT upper lobe pulmonary nodule not imaged on previous imaging studies. If bladder findings are compatible with neoplasm [employer] guidelines would not apply. Suggest close attention on follow-up, if bladder cancer is diagnosed 3 month follow-up of the chest may be warranted. 5. Lobulated LEFT adnexal structure with low attenuation but with intermediate density, 39 Hounsfield units. This does measure greatest craniocaudal dimension. This does measure 4.3 x 4.2 x 6.3 cm previously 4.8 x 4.6 x 6.0 cm. Given that this may represent a hemorrhagic cystic lesion of the adnexa in a postmenopausal patient greater than 6 cm would suggest follow-up pelvic sonogram if not yet performed. 6. Aortic atherosclerosis. Aortic Atherosclerosis (ICD10-I70.0). These results will be called to the ordering clinician or representative by the Radiologist Assistant, and communication documented in the PACS or [employer]. Electronically Signed   By: Zetta Bills M.D.   On: 06/01/2021 14:31   CT HEAD CODE STROKE WO CONTRAST  Result Date: 06/20/2021 CLINICAL DATA:  Code stroke. EXAM: CT HEAD WITHOUT CONTRAST TECHNIQUE: Contiguous  axial images were obtained from the base of the skull through the vertex without intravenous contrast. RADIATION DOSE REDUCTION: This exam was performed according to the departmental dose-optimization program which includes automated exposure control, adjustment of the mA and/or kV according to patient size and/or use of iterative reconstruction technique. COMPARISON:  Prior CT from 11/02/2019. FINDINGS: Brain: Age-related cerebral atrophy with chronic small vessel ischemic disease. Small parenchymal calcific density at the posterior left frontal periventricular white matter, stable. No acute intracranial hemorrhage. No visible acute large vessel territory infarct. No mass lesion or midline shift. No hydrocephalus or extra-axial fluid collection. Vascular: No hyperdense vessel. Scattered vascular calcifications noted within the carotid siphons. Skull: Scalp soft tissues and calvarium within normal limits. Calvarium intact. Sinuses/Orbits: Globes and orbital soft tissues demonstrate no acute finding. Scattered mucosal thickening noted within the ethmoidal air cells. Paranasal sinuses are otherwise clear. Trace left mastoid effusion noted. Other: None. ASPECTS [facility] Stroke Program Early CT Score) - Ganglionic level infarction (caudate, lentiform nuclei, internal capsule, insula, M1-M3 cortex): 7 - Supraganglionic infarction (M4-M6 cortex): 3 Total score (0-10 with 10 being normal): 10 IMPRESSION: 1. No acute intracranial abnormality. 2. ASPECTS is 10. 3. 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 These results were communicated to the nurse practitioner taking care of the patient, Sharion Settler at 3:32 am on 06/20/2021 by text page via the [facility] messaging system. Electronically Signed   By: Jeannine Boga M.D.   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 Please  see Dr. Gearldine Shown note from yesterday for detailed summary of her previous treatment course for TTP. This episode was potentially triggered by her TURBT and potentially from gemcitabine #2 thrombocytopenia from TTP platelets have improved from 16k to 53 K today. 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  Noninvasive high-grade urothelial carcinoma.   Plan -She notes no acute new symptoms overnight.  All her focal neurological symptoms have completely resolved and there were no other new symptoms at this time. She had her tunneled hemodialysis catheter placed yesterday. Labs today show normal hemoglobin of 13.4, WBC count of 7.9k and  platelets that have improved to 53k from a low of 16k. BMP shows improving creatinine now down to 1.06 from a peak of 2.24. LDH has normalized this morning at 162. Patient is tolerating her plasmapheresis without any significant issues.  -Discussed with hemodialysis unit they cannot do plasmapheresis on Sundays -Patient has been scheduled for the next plasmapheresis on Monday. -She is neurologically stable with significantly improving labs and is very keen to go home today. -I discussed with the patient that we typically would like for additional stability of her platelet counts and other parameters but it would be her choice to go home today after understanding risks and benefits. -Okay to discharge from hematology perspective today if the patient insists on going home.  She should follow-up for her plasmapheresis scheduled on Monday. -Would discharge on prednisone 60 mg p.o. daily with GI prophylaxis.  Follow-up with Dr. Benay Spice next week for recommendations on steroid taper and decisions regarding additional immunosuppressive therapies.   I spent 25 minutes counseling the patient face to face. The total time spent in the appointment was 40 minutes and more than 50%  was on counseling and direct patient cares and coordination of care.    Sullivan Lone MD MS AAHIVMS [facility] Hematology/Oncology Physician [place]   06/26/2021 11:59 AM

## 2021-06-26 NOTE — Discharge Summary (Signed)
Physician Discharge Summary   Patient: [name] MRN: [id number] DOB: Mar 20, 1941  Admit date:     06/20/2021  Discharge date: 06/26/21  Discharge Physician: Geradine Girt   PCP: Kirk Ruths, MD   Recommendations at discharge:    Plasmapheresis per oncology on Monday Hold azothioprine discharge on prednisone 60 mg p.o. daily.  She will follow with Dr. Benay Spice as outpatient to determine need for additional immunosuppressive therapies Home health  Discharge Diagnoses: Principal Problem:   TTP (thrombotic thrombocytopenic purpura) Active Problems:   Nicotine dependence   Acute left-sided weakness   AKI (acute kidney injury) [facility])   Hospital Course: 81yo female with history of DVT/PE, TTP, COPD, tobacco use, and depression who underwent right ureteroscopy with laser lithotripsy of distal ureteral stone, right ureteroscopy and laser lithotripsy of renal stone, right ureteral stent placement, and bladder biopsy and fulguration with instillation of gemcitabine 06/18/21.  After returning home her stent became partially dislodged during a coughing spell, so she and her daughter completely removed it. She subsequently reported worsening right-sided flank pain.  She presented to the [facility] ED where she was found to have pyelonephritis and started on IV Rocephin.   Overnight 2/18 > 2/19 while at [facility] the patient developed left-sided weakness and a code stroke was called.  Her symptoms soon after started improving, though not fully resolving. CT head/CTa head did not note any acute findings or large vessel occlusions. MRI was consistent with small multiple infarcts in B hemispheres. Her platelets trended down abruptly from 89K > 24K > 16K. It was suspected that she was in a TTP flare therefore arrangements were made to transfer her to [facility] for plasmapheresis.  Day 6/7 (2/24)  Assessment and Plan: * TTP (thrombotic thrombocytopenic purpura)- (present on  admission) Continue plasmapheresis under direction of Hematology -plan is for 7 days -can get again on Monday    AKI (acute kidney injury) ([place])- (present on admission) Acute kidney failure on CKD stage IIIa Baseline creatinine dating to January of this year approximately 1.3 with GFR 40-48 -acute worsening felt to be combination of pyelonephritis, stent removal, and acute critical illness with TTP -monitor trend  -renal U/S unremarkable  Acute left-sided weakness CTA neck unremarkable for any large vessel occlusion - MRI noted multiple B acute small multifocal infarcts - felt to be due to TTP - avoid ASA/Plavix due to TTP - PT/OT/SLP  Nicotine dependence- (present on admission) -patch        Consultants: oncology  Disposition: Home health Diet recommendation:  Discharge Diet Orders (From admission, onward)     Start     Ordered   06/26/21 0000  Diet general        06/26/21 1337           Regular diet  DISCHARGE MEDICATION: Allergies as of 06/26/2021       Reactions   Adhesive [tape] Other (See Comments)   Blisters, Band-Aids brand rips off the skin- paper tape only!!   Cefuroxime Axetil Anxiety, Other (See Comments)   Made the patient jittery   Cefuroxime Axetil Anxiety, Other (See Comments)   Shaky, amoxicillin ok Made the patient jittery   Pedi-pre Tape Spray [wound Dressing Adhesive] Other (See Comments)   Blisters, band-aide brand  Blisters, Band-Aids brand rips off the skin- paper tape only!!   Other Hives, Other (See Comments)   Wool: Reaction is hives Statistician tape: Reaction is blistering   Sulfa Antibiotics Hives   Sulfa  Drugs Cross Reactors Hives        Medication List     STOP taking these medications    azaTHIOprine 50 MG tablet Commonly known as: IMURAN   caplacizumab 11 MG Kit injection Commonly known as: CABLIVI   HYDROcodone-acetaminophen 5-325 MG tablet Commonly known as: NORCO/VICODIN       TAKE these  medications    cholecalciferol 10 MCG (400 UNIT) Tabs tablet Commonly known as: VITAMIN D3 Take 400 Units by mouth daily.   cyanocobalamin 1000 MCG tablet Take 1,000 mcg by mouth daily.   FLUoxetine 20 MG capsule Commonly known as: PROZAC Take 20 mg by mouth daily.   folic acid 1 MG tablet Commonly known as: FOLVITE Take 1 mg by mouth daily.   gabapentin 600 MG tablet Commonly known as: NEURONTIN Take 1 tablet (600 mg total) by mouth 3 (three) times daily. What changed:  when to take this reasons to take this   latanoprost 0.005 % ophthalmic solution Commonly known as: XALATAN Place 1 drop into both eyes.   multivitamin tablet Take 1 tablet by mouth daily.   pantoprazole 40 MG tablet Commonly known as: PROTONIX Take 1 tablet (40 mg total) by mouth daily at 12 noon.   polyethylene glycol 17 g packet Commonly known as: MIRALAX / GLYCOLAX Take 17 g by mouth daily. What changed:  when to take this reasons to take this   polyvinyl alcohol 1.4 % ophthalmic solution Commonly known as: LIQUIFILM TEARS Place 1 drop into both eyes as needed for dry eyes.   predniSONE 20 MG tablet Commonly known as: DELTASONE Take 3 tablets (60 mg total) by mouth daily with breakfast. What changed:  medication strength how much to take   QUEtiapine 25 MG tablet Commonly known as: SEROQUEL Take 25 mg by mouth at bedtime.   rosuvastatin 20 MG tablet Commonly known as: CRESTOR Take 20 mg by mouth at bedtime.   tiotropium 18 MCG inhalation capsule Commonly known as: SPIRIVA Place 18 mcg into inhaler and inhale daily.   traMADol 50 MG tablet Commonly known as: ULTRAM Take 1 tablet (50 mg total) by mouth every 6 (six) hours as needed for moderate pain.        Follow-up Information     [place] [place] Follow up.   Specialty: [place] Why: for home health services Contact information: [place]  [id number] [phone]         Kirk Ruths, MD Follow up in 1 week(s).   Specialty: Internal Medicine Contact information: [address] [facility] [place] [address] [phone]                 Discharge Exam: Danley Danker Weights   06/26/21 0505 06/26/21 1101 06/26/21 1310  Weight: 81.4 kg 82 kg 82 kg     Condition at discharge: good  The results of significant diagnostics from this hospitalization (including imaging, microbiology, ancillary and laboratory) are listed below for reference.   Imaging Studies: CT ABDOMEN PELVIS WO CONTRAST  Result Date: 06/19/2021 CLINICAL DATA:  Flank pain, kidney stone suspected EXAM: CT ABDOMEN AND PELVIS WITHOUT CONTRAST TECHNIQUE: Multidetector CT imaging of the abdomen and pelvis was performed following the standard protocol without IV contrast. RADIATION DOSE REDUCTION: This exam was performed according to the departmental dose-optimization program which includes automated exposure control, adjustment of the mA and/or kV according to patient size and/or use of iterative reconstruction technique. COMPARISON:  CT 05/31/2021 FINDINGS: Lower chest: Bibasilar atelectasis. There are 5 mm nodules in the peripheral lung bases (series 3, image 4). Hepatobiliary: No focal liver abnormality is seen. Possible tiny hyperdense stones in the gallbladder fundus. Gallbladder is nondilated. Pancreas: Unremarkable. No pancreatic ductal dilatation or surrounding inflammatory changes. Spleen: Normal in size without focal abnormality. Adrenals/Urinary Tract: Adrenal glands are unremarkable. Punctate nonobstructive left lower pole renal stone. No left-sided hydronephrosis. Unchanged left lower pole cyst. Right-sided hydroureteronephrosis with perinephric and periureteral stranding. There is a small focus of gas within the right renal pelvis (series 2, image 30) likely related to recent retrograde pyelogram. Unchanged right renal cysts.  There is no visible obstructing stone in the ureter. The previously seen 3 mm stone in the distal right ureter is no longer visible distant with recent ureteroscopy and lithotripsy. There is no stent in the ureter present. There is gas in the bladder consistent with recent procedure. Stomach/Bowel: Small hiatal hernia. The stomach is otherwise within normal limits. There is no evidence of bowel obstruction. The appendix is normal. There are few sigmoid diverticula. Vascular/Lymphatic: Aortoiliac atherosclerotic calcifications. No AAA. No lymphadenopathy. Reproductive: There is a 4.9 x 4.0 x 6.0 cm left adnexal structure with intermediate density, unchanged from prior exam. Other: No bowel containing hernia. No abdominopelvic ascites. No free air. Musculoskeletal: Unchanged chronic T12 compression deformity with 25% height loss anteriorly. Multilevel degenerative changes of the spine with severe degenerative disc disease and facet arthropathy in the lumbar spine. IMPRESSION: Right-sided hydroureteronephrosis with perinephric and perinephric inflammatory stranding. No obstructing stone is visible within the ureter. Small focus of gas within the right renal pelvis, likely related to recent retrograde pyelogram. There is no stent in the ureter. Recommend urology consultation. Unchanged intermediate density left adnexal lesion measuring up to 6.0 cm, which could be a hemorrhagic cyst. Given the patient is postmenopausal, nonemergent pelvic ultrasound is recommended for further evaluation if not yet performed. Two incidental 5 mm pulmonary nodules in the peripheral lung bases. No follow-up needed if patient is low-risk (and has no known or suspected primary neoplasm). Non-contrast chest CT can be considered in 12 months if patient is high-risk. This recommendation follows the consensus statement: Guidelines for Management of Incidental Pulmonary Nodules Detected on CT Images: From the Fleischner Society 2017; Radiology  2017; 284:228-243. Electronically Signed   By: Maurine Simmering M.D.   On: 06/19/2021 09:45   CT ANGIO HEAD NECK W WO CM  Result Date: 06/20/2021 CLINICAL DATA:  81 year old female code stroke presentation. Left side weakness. EXAM: CT ANGIOGRAPHY HEAD AND NECK TECHNIQUE: Multidetector CT imaging of the head and neck was performed using the standard protocol during bolus administration of intravenous contrast. Multiplanar CT image reconstructions and MIPs were obtained to evaluate the vascular anatomy. Carotid stenosis measurements (when applicable) are obtained utilizing NASCET criteria, using the distal internal carotid diameter as the denominator. RADIATION DOSE REDUCTION: This exam was performed according to the departmental dose-optimization program which includes automated exposure control, adjustment of the mA and/or kV according to patient size and/or use of iterative reconstruction technique. CONTRAST:  61m OMNIPAQUE IOHEXOL 350 MG/ML SOLN COMPARISON:  Plain head CT 0318 hours. FINDINGS: CTA NECK Skeleton: Osteopenia. Chronic lower cervical spine disc and endplate degeneration. Multilevel mild upper thoracic compression fractures, such as T3 and T4, appears stable from a 2021 cervical spine CT. No acute osseous abnormality identified. Upper chest: Mild dependent atelectasis. Mild retained secretions in the proximal thoracic esophagus which is nondilated. No visible mediastinal lymphadenopathy. Visible central  pulmonary arteries appear patent. Other neck: Right supraclavicular venous collaterals appear incidental. Retropharyngeal course of both carotids (normal variant). No acute neck soft tissue finding. Aortic arch: Mild Calcified aortic atherosclerosis. Three vessel arch configuration. Right carotid system: Mild brachiocephalic artery calcified plaque without stenosis. Normal right CCA origin. Tortuous right CCA with a retropharyngeal carotid bifurcation. Mild right ICA origin and bulb calcified plaque  without stenosis. Left carotid system: Negative left CCA origin. Tortuous left CCA with a retropharyngeal course similar to the right. Mild calcified plaque at the left ICA origin and bulb without stenosis. Vertebral arteries: Mild calcified plaque in the proximal right subclavian artery without stenosis. Minimal calcified plaque at the right vertebral artery origin without stenosis. Right vertebral artery appears mildly non dominant and patent to the skull base without stenosis. Mild proximal left subclavian artery calcified plaque without stenosis. Normal left vertebral artery origin. Dominant left vertebral artery is tortuous in the V2 segment and patent to the skull base without plaque or stenosis. CTA HEAD Posterior circulation: Normal distal vertebral arteries and vertebrobasilar junction. Dominant left V4 segment. Normal PICA origins. Patent basilar artery without stenosis. Normal SCA and left PCA origin. Fetal type right PCA origin. Left posterior communicating artery diminutive or absent. Bilateral PCA branches are within normal limits. Anterior circulation: Both ICA siphons are patent and tortuous. Mild to moderate calcified plaque on the left but only mild left siphon stenosis. Similar calcified plaque on the right with no significant stenosis. Normal right posterior communicating artery origin. Small infundibulum of the distal left ICA (normal variant). Patent carotid termini. Normal MCA and ACA origins. Normal anterior communicating artery. Bilateral ACA branches are within normal limits. Left MCA M1 segment and bifurcation are patent without stenosis. Right MCA M1 segment and bifurcation are patent without stenosis. Bilateral MCA branches are within normal limits. Venous sinuses: Patent. Anatomic variants: Dominant left vertebral artery. Fetal type right PCA origin. Review of the MIP images confirms the above findings IMPRESSION: 1. Negative for large vessel occlusion. And generally mild for age  atherosclerosis in the head and neck, most pronounced at the ICA siphons. No significant arterial stenosis. 2. Aortic Atherosclerosis (ICD10-I70.0). 3. Osteopenia with multiple mild chronic upper thoracic compression fractures. Electronically Signed   By: Genevie Ann M.D.   On: 06/20/2021 04:16   MR BRAIN WO CONTRAST  Result Date: 06/20/2021 CLINICAL DATA:  81 year old female code stroke presentation. Left side weakness. EXAM: MRI HEAD WITHOUT CONTRAST TECHNIQUE: Multiplanar, multiecho pulse sequences of the brain and surrounding structures were obtained without intravenous contrast. COMPARISON:  Brain MRI 09/14/2012. FINDINGS: The examination had to be discontinued prior to completion due to patient back pain. Only DWI, axial T2* and FLAIR imaging was obtained. Mildly motion degraded axial and coronal DWI imaging demonstrates multifocal, widely scattered, generally small foci of restricted diffusion in the bilateral cerebral and cerebellar hemispheres. Most confluent area is in the right centrum semiovale near the pre motor area on series 6, image 40. Essentially all vascular territories affected. Deep gray matter nuclei and brainstem are spared. No intracranial mass effect. Stable ventricle size and configuration. Basilar cisterns appear normal. Patchy additional bilateral cerebral white matter and central pontine FLAIR heterogeneity appears increased since 2014. No evidence of acute or chronic intracranial hemorrhage. IMPRESSION: 1. Small acute infarcts - most punctate - scattered throughout the bilateral cerebral and cerebellar hemispheres in keeping with a recent Embolic event from the heart or proximal aorta. Largest area of involvement is in the right frontal lobe pre-motor white matter  area. No associated mass effect or hemorrhage. 2. Truncated exam, only three imaging sequences obtained. Electronically Signed   By: Genevie Ann M.D.   On: 06/20/2021 08:30   US RENAL  Result Date: 06/22/2021 CLINICAL DATA:   Renal dysfunction EXAM: RENAL / URINARY TRACT ULTRASOUND COMPLETE COMPARISON:  CT done on 06/19/2021 FINDINGS: Right Kidney: Renal measurements: 9.7 x 5.1 x 4.5 cm = volume: 117.7 mL. There is no hydronephrosis. There is interval resolution of right hydronephrosis. There is 2.4 cm cyst in the upper pole. There is slightly increased cortical echogenicity. There is no perinephric fluid collection. Left Kidney: Renal measurements: 9.8 x 5.1 x 4.9 cm = volume: 126.9 mL. There is no hydronephrosis. There is slightly increased cortical echogenicity. Bladder: Layering low-level echoes are noted in the dependent portion of bladder lumen. Other: None. IMPRESSION: There is no hydronephrosis. Right renal cyst. Increased cortical echogenicity may suggest medical renal disease. Small amount of debris is seen in the dependent portion of urinary bladder lumen, possibly blood products. Electronically Signed   By: Elmer Picker M.D.   On: 06/22/2021 17:02   IR Fluoro Guide CV Line Left  Result Date: 06/20/2021 INDICATION: 81 year old with TTP.  Patient needs plasmapheresis. EXAM: FLUOROSCOPIC AND ULTRASOUND GUIDED PLACEMENT OF A NON-TUNNELED DIALYSIS CATHETER Physician: Stephan Minister. Henn, MD MEDICATIONS: 1% lidocaine ANESTHESIA/SEDATION: Local anesthetic FLUOROSCOPY TIME:  Radiation Exposure Index (as provided by the fluoroscopic device): 9 mGy Kerma COMPLICATIONS: None immediate. PROCEDURE: Informed consent was obtained for catheter placement. The patient was placed supine on the interventional table. Both left and right internal jugular veins appear to be occluded with ultrasound. Left external jugular vein was patent. Ultrasound was used for the procedure but an image was not saved. The left neck was prepped and draped in a sterile fashion. The left neck was anesthetized with 1% lidocaine. Maximal barrier sterile technique was utilized including caps, mask, sterile gowns, sterile gloves, sterile drape, hand hygiene and skin  antiseptic. A small incision was made with #11 blade scalpel. A 21 gauge needle directed into the left external jugular vein with ultrasound guidance. A micropuncture dilator set was placed. A 24 cm Mahurkar catheter was selected. The catheter was advanced over a wire and positioned at the superior cavoatrial junction. Fluoroscopic images were obtained for documentation. Both dialysis lumens were found to aspirate and flush well. The proper amount of heparin was flushed in both lumens. The central venous lumen was flushed with normal saline. Catheter was sutured to skin. FINDINGS: Catheter tip at the superior cavoatrial junction. Left and right internal jugular veins appear to be occluded in the lower neck. Catheter was placed through the left external jugular vein. IMPRESSION: Successful placement of a left external jugular non-tunneled dialysis catheter using ultrasound and fluoroscopic guidance. Electronically Signed   By: Markus Daft M.D.   On: 06/20/2021 20:51   IR Fluoro Guide CV Line Right  Result Date: 06/25/2021 CLINICAL DATA:  History of TTP and status post non tunneled pheresis catheter placement on 06/20/2021 via left external jugular vein. The patient now requires placement of a tunneled catheter for longer-term plasmapheresis. EXAM: TUNNELED CENTRAL VENOUS PHERESIS/HEMODIALYSIS CATHETER PLACEMENT WITH ULTRASOUND AND FLUOROSCOPIC GUIDANCE ANESTHESIA/SEDATION: Moderate (conscious) sedation was employed during this procedure. A total of Versed 1.5 mg and Fentanyl 75 mcg was administered intravenously by radiology nursing. Moderate Sedation Time: 38 minutes. The patient's level of consciousness and vital signs were monitored continuously by radiology nursing throughout the procedure under my direct supervision. MEDICATIONS: 1 g IV  vancomycin FLUOROSCOPY: 3 minutes and 24 seconds.  24.0 mGy. PROCEDURE: The procedure, risks, benefits, and alternatives were explained to the patient. Questions regarding the  procedure were encouraged and answered. The patient understands and consents to the procedure. A timeout was performed prior to initiating the procedure. The left neck and chest were prepped with chlorhexidine in a sterile fashion, and a sterile drape was applied covering the operative field. Maximum barrier sterile technique with sterile gowns and gloves were used for the procedure. Local anesthesia was provided with 1% lidocaine. After creating a small venotomy incision, a 21 gauge needle was advanced into the left external jugular vein under direct, real-time ultrasound guidance. Ultrasound image documentation was performed with a permanent recorded ultrasound image. After securing guidewire access a 5 French catheter was advanced to assist in advancing guidewire access to the level of the SVC. A wire was kinked to measure appropriate catheter length. A Palindrome tunneled hemodialysis catheter measuring 28 cm from tip to cuff was chosen for placement. This was tunneled in a retrograde fashion from the chest wall to the venotomy incision. At the venotomy, serial dilatation was performed and a 15 Fr peel-away sheath was placed over a guidewire. The catheter was then placed through the sheath and the sheath removed. Final catheter positioning was confirmed and documented with a fluoroscopic spot image. The catheter was aspirated, flushed with saline, and injected with appropriate volume sodium citrate dwells. The venotomy incision was closed with subcuticular 4-0 Vicryl. Dermabond was applied to the incision. The catheter exit site was secured with 0-Prolene retention sutures. After tunnel catheter placement, the non tunneled temporary pheresis catheter was removed and manual compression held over the catheter exit site. COMPLICATIONS: None.  No pneumothorax. 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 After catheter  placement, the tip lies in the right atrium. The catheter aspirates normally and is ready for immediate use. IMPRESSION: Placement of tunneled hemodialysis catheter via the left external jugular vein. The catheter tip lies in the right atrium. The catheter is ready for immediate use. The temporary non tunneled pheresis catheter was removed after new tunnel catheter placement. Electronically Signed   By: Aletta Edouard M.D.   On: 06/25/2021 16:45   IR US Guide Vasc Access Left  Result Date: 06/25/2021 CLINICAL DATA:  History of TTP and status post non tunneled pheresis catheter placement on 06/20/2021 via left external jugular vein. The patient now requires placement of a tunneled catheter for longer-term plasmapheresis. EXAM: TUNNELED CENTRAL VENOUS PHERESIS/HEMODIALYSIS CATHETER PLACEMENT WITH ULTRASOUND AND FLUOROSCOPIC GUIDANCE ANESTHESIA/SEDATION: Moderate (conscious) sedation was employed during this procedure. A total of Versed 1.5 mg and Fentanyl 75 mcg was administered intravenously by radiology nursing. Moderate Sedation Time: 38 minutes. The patient's level of consciousness and vital signs were monitored continuously by radiology nursing throughout the procedure under my direct supervision. MEDICATIONS: 1 g IV vancomycin FLUOROSCOPY: 3 minutes and 24 seconds.  24.0 mGy. PROCEDURE: The procedure, risks, benefits, and alternatives were explained to the patient. Questions regarding the procedure were encouraged and answered. The patient understands and consents to the procedure. A timeout was performed prior to initiating the procedure. The left neck and chest were prepped with chlorhexidine in a sterile fashion, and a sterile drape was applied covering the operative field. Maximum barrier sterile technique with sterile gowns and gloves were used for the procedure. Local anesthesia was provided with 1% lidocaine. After creating a small venotomy  incision, a 21 gauge needle was advanced into the left  external jugular vein under direct, real-time ultrasound guidance. Ultrasound image documentation was performed with a permanent recorded ultrasound image. After securing guidewire access a 5 French catheter was advanced to assist in advancing guidewire access to the level of the SVC. A wire was kinked to measure appropriate catheter length. A Palindrome tunneled hemodialysis catheter measuring 28 cm from tip to cuff was chosen for placement. This was tunneled in a retrograde fashion from the chest wall to the venotomy incision. At the venotomy, serial dilatation was performed and a 15 Fr peel-away sheath was placed over a guidewire. The catheter was then placed through the sheath and the sheath removed. Final catheter positioning was confirmed and documented with a fluoroscopic spot image. The catheter was aspirated, flushed with saline, and injected with appropriate volume sodium citrate dwells. The venotomy incision was closed with subcuticular 4-0 Vicryl. Dermabond was applied to the incision. The catheter exit site was secured with 0-Prolene retention sutures. After tunnel catheter placement, the non tunneled temporary pheresis catheter was removed and manual compression held over the catheter exit site. COMPLICATIONS: None.  No pneumothorax. 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 After catheter placement, the tip lies in the right atrium. The catheter aspirates normally and is ready for immediate use. IMPRESSION: Placement of tunneled hemodialysis catheter via the left external jugular vein. The catheter tip lies in the right atrium. The catheter is ready for immediate use. The temporary non tunneled pheresis catheter was removed after new tunnel catheter placement. Electronically Signed   By: Aletta Edouard M.D.   On: 06/25/2021 16:45   IR US Guide Vasc Access Left  Result Date: 06/20/2021 INDICATION:  81 year old with TTP.  Patient needs plasmapheresis. EXAM: FLUOROSCOPIC AND ULTRASOUND GUIDED PLACEMENT OF A NON-TUNNELED DIALYSIS CATHETER Physician: Stephan Minister. Henn, MD MEDICATIONS: 1% lidocaine ANESTHESIA/SEDATION: Local anesthetic FLUOROSCOPY TIME:  Radiation Exposure Index (as provided by the fluoroscopic device): 9 mGy Kerma COMPLICATIONS: None immediate. PROCEDURE: Informed consent was obtained for catheter placement. The patient was placed supine on the interventional table. Both left and right internal jugular veins appear to be occluded with ultrasound. Left external jugular vein was patent. Ultrasound was used for the procedure but an image was not saved. The left neck was prepped and draped in a sterile fashion. The left neck was anesthetized with 1% lidocaine. Maximal barrier sterile technique was utilized including caps, mask, sterile gowns, sterile gloves, sterile drape, hand hygiene and skin antiseptic. A small incision was made with #11 blade scalpel. A 21 gauge needle directed into the left external jugular vein with ultrasound guidance. A micropuncture dilator set was placed. A 24 cm Mahurkar catheter was selected. The catheter was advanced over a wire and positioned at the superior cavoatrial junction. Fluoroscopic images were obtained for documentation. Both dialysis lumens were found to aspirate and flush well. The proper amount of heparin was flushed in both lumens. The central venous lumen was flushed with normal saline. Catheter was sutured to skin. FINDINGS: Catheter tip at the superior cavoatrial junction. Left and right internal jugular veins appear to be occluded in the lower neck. Catheter was placed through the left external jugular vein. IMPRESSION: Successful placement of a left external jugular non-tunneled dialysis catheter using ultrasound and fluoroscopic guidance. Electronically Signed   By: Markus Daft M.D.   On: 06/20/2021 20:51   DG OR UROLOGY CYSTO  IMAGE ([place])  Result  Date: 06/18/2021 There is no interpretation for this exam.  This order is for images obtained during a surgical procedure.  Please See "Surgeries" Tab for more information regarding the procedure.   CT HEMATURIA WORKUP  Result Date: 06/01/2021 CLINICAL DATA:  Gross hematuria in a 81 year old female. EXAM: CT ABDOMEN AND PELVIS WITHOUT AND WITH CONTRAST TECHNIQUE: Multidetector CT imaging of the abdomen and pelvis was performed following the standard protocol before and following the bolus administration of intravenous contrast. RADIATION DOSE REDUCTION: This exam was performed according to the departmental dose-optimization program which includes automated exposure control, adjustment of the mA and/or kV according to patient size and/or use of iterative reconstruction technique. CONTRAST:  140m OMNIPAQUE IOHEXOL 300 MG/ML  SOLN COMPARISON:  Sep 24, 2020. FINDINGS: Lower chest: Incidental imaging of the lung bases without sign of effusion or consolidative change. 5 mm LEFT upper lobe pulmonary nodule (image 2/9) not imaged on previous imaging studies. No consolidation. No pleural effusion. Heart size unremarkable though incompletely imaged. Hepatobiliary: No focal, suspicious hepatic lesion. No pericholecystic stranding. No biliary duct dilation. Portal vein is patent. Pancreas: Normal, without mass, inflammation or ductal dilatation. Spleen: Normal. Adrenals/Urinary Tract: Adrenal glands are normal. Cortical scarring of the bilateral kidneys without signs of hydronephrosis or perinephric stranding. Tiny intrarenal calculus in the interpolar RIGHT kidney measures 2-3 mm. Bilateral renal cysts are noted not substantially changed from prior imaging. No abnormal enhancement along the course of the LEFT or RIGHT ureter but with a small distal RIGHT ureteral calculus (image 64/7) this measures 3 mm. Subtle nodularity is present at the LEFT bladder base (image 72/7) 5 mm. Urinary bladder is collapsed which does limit  assessment. Filling defect as well as enhancing area seen at the LEFT bladder base near the UVJ (image 68/13 in addition to the above referenced image. Stomach/Bowel: Normal appendix.  No acute gastrointestinal findings. Vascular/Lymphatic: Aortic atherosclerosis. No sign of aneurysm. Smooth contour of the IVC. There is no gastrohepatic or hepatoduodenal ligament lymphadenopathy. No retroperitoneal or mesenteric lymphadenopathy. No pelvic sidewall lymphadenopathy. Reproductive: Lobulated LEFT adnexal structure with low attenuation but with intermediate density, 39 Hounsfield units measuring 4.3 x 4.2 x 6.3 cm previously 4.8 x 4.6 x 6.0 cm. This does measure greatest craniocaudal dimension. RIGHT adnexa unremarkable by CT. Other: No ascites. Musculoskeletal: No acute bone finding. No destructive bone process. Spinal degenerative changes. Excretory phase: Filling defect of the LEFT posterolateral urinary bladder base near the UVJ as described no visible upper tract lesion. IMPRESSION: 1. Filling defect with enhancement in the LEFT posterolateral urinary bladder base near the LEFT UVJ suspicious for small urothelial neoplasm. Cystoscopy is advised. 2. 3 mm distal RIGHT ureteral calculus without current signs of obstruction. 3. Bilateral renal cysts. 4. 5 mm LEFT upper lobe pulmonary nodule not imaged on previous imaging studies. If bladder findings are compatible with neoplasm [employer]idelines would not apply. Suggest close attention on follow-up, if bladder cancer is diagnosed 3 month follow-up of the chest may be warranted. 5. Lobulated LEFT adnexal structure with low attenuation but with intermediate density, 39 Hounsfield units. This does measure greatest craniocaudal dimension. This does measure 4.3 x 4.2 x 6.3 cm previously 4.8 x 4.6 x 6.0 cm. Given that this may represent a hemorrhagic cystic lesion of the adnexa in a postmenopausal patient greater than 6 cm would suggest follow-up pelvic sonogram if  not yet performed. 6. Aortic atherosclerosis. Aortic Atherosclerosis (ICD10-I70.0). These results will be called to the ordering  clinician or representative by the Radiologist Assistant, and communication documented in the PACS or [employer]. Electronically Signed   By: Zetta Bills M.D.   On: 06/01/2021 14:31   CT HEAD CODE STROKE WO CONTRAST  Result Date: 06/20/2021 CLINICAL DATA:  Code stroke. EXAM: CT HEAD WITHOUT CONTRAST TECHNIQUE: Contiguous axial images were obtained from the base of the skull through the vertex without intravenous contrast. RADIATION DOSE REDUCTION: This exam was performed according to the departmental dose-optimization program which includes automated exposure control, adjustment of the mA and/or kV according to patient size and/or use of iterative reconstruction technique. COMPARISON:  Prior CT from 11/02/2019. FINDINGS: Brain: Age-related cerebral atrophy with chronic small vessel ischemic disease. Small parenchymal calcific density at the posterior left frontal periventricular white matter, stable. No acute intracranial hemorrhage. No visible acute large vessel territory infarct. No mass lesion or midline shift. No hydrocephalus or extra-axial fluid collection. Vascular: No hyperdense vessel. Scattered vascular calcifications noted within the carotid siphons. Skull: Scalp soft tissues and calvarium within normal limits. Calvarium intact. Sinuses/Orbits: Globes and orbital soft tissues demonstrate no acute finding. Scattered mucosal thickening noted within the ethmoidal air cells. Paranasal sinuses are otherwise clear. Trace left mastoid effusion noted. Other: None. ASPECTS [facility] Stroke Program Early CT Score) - Ganglionic level infarction (caudate, lentiform nuclei, internal capsule, insula, M1-M3 cortex): 7 - Supraganglionic infarction (M4-M6 cortex): 3 Total score (0-10 with 10 being normal): 10 IMPRESSION: 1. No acute intracranial abnormality. 2. ASPECTS is 10. 3.  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 These results were communicated to the nurse practitioner taking care of the patient, Sharion Settler at 3:32 am on 06/20/2021 by text page via the [facility] messaging system. Electronically Signed   By: Jeannine Boga M.D.   On: 06/20/2021 03:35    Microbiology: Results for orders placed or performed during the hospital encounter of 06/19/21  Urine Culture     Status: None   Collection Time: 06/19/21  8:14 AM   Specimen: Urine, Clean Catch  Result Value Ref Range Status   Specimen Description   Final    URINE, CLEAN CATCH Performed at [facility], [address], [place], [place] [address]    Special Requests   Final    NONE Performed at [facility], [address], [place], [place] [address]    Culture   Final    NO GROWTH Performed at [facility], [place] [address] [place], [place] [address]    Report Status 06/20/2021 FINAL  Final  Blood culture (routine x 2)     Status: None   Collection Time: 06/19/21 10:23 AM   Specimen: BLOOD  Result Value Ref Range Status   Specimen Description BLOOD BLOOD RIGHT WRIST  Final   Special Requests   Final    BOTTLES DRAWN AEROBIC AND ANAEROBIC Blood Culture results may not be optimal due to an inadequate volume of blood received in culture bottles   Culture   Final    NO GROWTH 5 DAYS Performed at [facility], [address] [place], [place] [address]    Report Status 06/24/2021 FINAL  Final  Resp Panel by RT-PCR (Flu A&B, Covid) Nasopharyngeal Swab     Status: None   Collection Time: 06/19/21 10:23 AM   Specimen: Nasopharyngeal Swab; Nasopharyngeal(NP) swabs in vial transport medium  Result Value Ref Range Status   SARS Coronavirus 2 by RT PCR NEGATIVE NEGATIVE Final    Comment: (NOTE) SARS-CoV-2 target  nucleic acids are NOT DETECTED.  The SARS-CoV-2 RNA is generally detectable in upper respiratory specimens during the acute  phase of infection. The lowest concentration of SARS-CoV-2 viral copies this assay can detect is 138 copies/mL. A negative result does not preclude SARS-Cov-2 infection and should not be used as the sole basis for treatment or other patient management decisions. A negative result may occur with  improper specimen collection/handling, submission of specimen other than nasopharyngeal swab, presence of viral mutation(s) within the areas targeted by this assay, and inadequate number of viral copies(<138 copies/mL). A negative result must be combined with clinical observations, patient history, and epidemiological information. The expected result is Negative.  Fact Sheet for Patients:  EntrepreneurPulse.com.au  Fact Sheet for Healthcare Providers:  IncredibleEmployment.be  This test is no t yet approved or cleared by the [place] FDA and  has been authorized for detection and/or diagnosis of SARS-CoV-2 by FDA under an Emergency Use Authorization (EUA). This EUA will remain  in effect (meaning this test can be used) for the duration of the COVID-19 declaration under Section 564(b)(1) of the Act, 21 U.S.C.section 360bbb-3(b)(1), unless the authorization is terminated  or revoked sooner.       Influenza A by PCR NEGATIVE NEGATIVE Final   Influenza B by PCR NEGATIVE NEGATIVE Final    Comment: (NOTE) The Xpert Xpress SARS-CoV-2/FLU/RSV plus assay is intended as an aid in the diagnosis of influenza from Nasopharyngeal swab specimens and should not be used as a sole basis for treatment. Nasal washings and aspirates are unacceptable for Xpert Xpress SARS-CoV-2/FLU/RSV testing.  Fact Sheet for Patients: EntrepreneurPulse.com.au  Fact Sheet for Healthcare Providers: IncredibleEmployment.be  This test is not yet approved or cleared by the [place] FDA and has been authorized for detection and/or diagnosis of  SARS-CoV-2 by FDA under an Emergency Use Authorization (EUA). This EUA will remain in effect (meaning this test can be used) for the duration of the COVID-19 declaration under Section 564(b)(1) of the Act, 21 U.S.C. section 360bbb-3(b)(1), unless the authorization is terminated or revoked.  Performed at [facility], [address] [place], [place] [address]   Blood culture (routine x 2)     Status: None   Collection Time: 06/19/21  2:27 PM   Specimen: BLOOD  Result Value Ref Range Status   Specimen Description BLOOD RIGHT ANTECUBITAL  Final   Special Requests   Final    BOTTLES DRAWN AEROBIC AND ANAEROBIC Blood Culture adequate volume   Culture   Final    NO GROWTH 5 DAYS Performed at [facility], [address], [place], [place] [address]    Report Status 06/24/2021 FINAL  Final  MRSA Next Gen by PCR, Nasal     Status: None   Collection Time: 06/20/21 12:00 PM   Specimen: Nasal Mucosa; Nasal Swab  Result Value Ref Range Status   MRSA by PCR Next Gen NOT DETECTED NOT DETECTED Final    Comment: (NOTE) The GeneXpert MRSA Assay (FDA approved for NASAL specimens only), is one component of a comprehensive MRSA colonization surveillance program. It is not intended to diagnose MRSA infection nor to guide or monitor treatment for MRSA infections. Test performance is not FDA approved in patients less than 5 years old. Performed at [facility], [address] [place], [place] [address]     Labs: CBC: Recent Labs  Lab 06/22/21 0255 06/23/21 0130 06/24/21 0128 06/25/21 0143 06/26/21 0414  WBC 12.5* 9.9 7.8 6.8 7.9  NEUTROABS  11.8* 9.0* 6.7 6.0 5.8  HGB 14.3 13.7 14.0 14.5 13.4  HCT 41.2 39.3 40.6 41.4 39.0  MCV 95.8 94.2 94.9 95.0 94.7  PLT 33* 34* 34* 39* 53*   Basic Metabolic Panel: Recent Labs  Lab 06/21/21 0402 06/22/21 0255 06/23/21 0130 06/23/21 1518 06/24/21 0128 06/25/21 0143 06/26/21 0414  NA 138   < > 140 139 142 140  139  K 3.9   < > 3.9 4.2 4.3 4.3 4.1  CL 99   < > 99 100 101 99 101  CO2 28   < > 32 30 32 32 31  GLUCOSE 144*   < > 149* 265* 122* 145* 90  BUN 39*   < > 47* 47* 43* 42* 35*  CREATININE 2.21*   < > 1.64* 1.64* 1.37* 1.17* 1.06*  CALCIUM 9.6   < > 9.1 8.8* 9.2 8.6* 8.3*  MG 1.8  --   --   --   --   --   --   PHOS 4.4  --   --   --   --   --   --    < > = values in this interval not displayed.   Liver Function Tests: Recent Labs  Lab 06/21/21 0402 06/22/21 0255 06/23/21 0130  AST 36 33 34  ALT _0 ALKPHOS 38 35* 41  BILITOT 0.9 1.0 0.5  PROT 5.9* 5.3* 4.9*  ALBUMIN 3.4* 3.0* 2.9*   CBG: No results for input(s): GLUCAP in the last 168 hours.   Discharge time spent: greater than 30 minutes.  Signed: Geradine Girt, DO [facility] 06/27/2021

## 2021-06-26 NOTE — Progress Notes (Signed)
Pt discharged from unit. Medication/discharge instruction given. Confirmed Pt plasmapheresis appointment for Monday 06/28/21 at 11am at [facility]. Daughter and pt aware.   Phoebe Sharps, RN

## 2021-06-27 LAB — THERAPEUTIC PLASMA EXCHANGE (BLOOD BANK)
Plasma Exchange: 2200
Plasma volume needed: 2200
Unit division: 0
Unit division: 0
Unit division: 0
Unit division: 0
Unit division: 0
Unit division: 0

## 2021-06-28 ENCOUNTER — Non-Acute Institutional Stay (HOSPITAL_COMMUNITY)
Admission: AD | Admit: 2021-06-28 | Discharge: 2021-06-28 | Disposition: A | Discharge status: Still In Hospital | Payer: Medicare Other | Source: Ambulatory Visit | Attending: Oncology | Admitting: Oncology

## 2021-06-28 ENCOUNTER — Other Ambulatory Visit: Payer: Self-pay | Admitting: Hematology

## 2021-06-28 ENCOUNTER — Telehealth: Payer: Self-pay | Admitting: *Deleted

## 2021-06-28 ENCOUNTER — Encounter (HOSPITAL_COMMUNITY): Payer: Self-pay

## 2021-06-28 ENCOUNTER — Other Ambulatory Visit: Payer: Self-pay | Admitting: Oncology

## 2021-06-28 DIAGNOSIS — M3119 Other thrombotic microangiopathy: Secondary | ICD-10-CM | POA: Diagnosis not present

## 2021-06-28 LAB — CBC
HCT: 41.7 % (ref 36.0–46.0)
Hemoglobin: 14 g/dL (ref 12.0–15.0)
MCH: 32.7 pg (ref 26.0–34.0)
MCHC: 33.6 g/dL (ref 30.0–36.0)
MCV: 97.4 fL (ref 80.0–100.0)
Platelets: 116 10*3/uL — ABNORMAL LOW (ref 150–400)
RBC: 4.28 MIL/uL (ref 3.87–5.11)
RDW: 13.1 % (ref 11.5–15.5)
WBC: 9.6 10*3/uL (ref 4.0–10.5)
nRBC: 0 % (ref 0.0–0.2)

## 2021-06-28 LAB — BASIC METABOLIC PANEL
Anion gap: 10 (ref 5–15)
BUN: 27 mg/dL — ABNORMAL HIGH (ref 8–23)
CO2: 28 mmol/L (ref 22–32)
Calcium: 8.5 mg/dL — ABNORMAL LOW (ref 8.9–10.3)
Chloride: 102 mmol/L (ref 98–111)
Creatinine, Ser: 1.15 mg/dL — ABNORMAL HIGH (ref 0.44–1.00)
GFR, Estimated: 48 mL/min — ABNORMAL LOW (ref >60)
Glucose, Bld: 131 mg/dL — ABNORMAL HIGH (ref 70–99)
Potassium: 4.2 mmol/L (ref 3.5–5.1)
Sodium: 140 mmol/L (ref 135–145)

## 2021-06-28 LAB — LACTATE DEHYDROGENASE: LDH: 217 U/L — ABNORMAL HIGH (ref 98–192)

## 2021-06-28 MED ORDER — CALCIUM CARBONATE ANTACID 500 MG PO CHEW
CHEWABLE_TABLET | ORAL | Status: AC
  Filled 2021-06-28: qty 2

## 2021-06-28 MED ORDER — ACETAMINOPHEN 325 MG PO TABS: 650.0000 mg | ORAL_TABLET | ORAL | Status: DC | PRN

## 2021-06-28 MED ORDER — ACETAMINOPHEN 325 MG PO TABS
ORAL_TABLET | ORAL | Status: AC
  Administered 2021-06-28: 650 mg via ORAL
  Filled 2021-06-28: qty 2

## 2021-06-28 MED ORDER — DIPHENHYDRAMINE HCL 25 MG PO CAPS: 25.0000 mg | ORAL_CAPSULE | Freq: Four times a day (QID) | ORAL | Status: DC | PRN

## 2021-06-28 MED ORDER — DIPHENHYDRAMINE HCL 25 MG PO CAPS
ORAL_CAPSULE | ORAL | Status: AC
  Administered 2021-06-28: 25 mg
  Filled 2021-06-28: qty 1

## 2021-06-28 MED ORDER — CALCIUM GLUCONATE-NACL 2-0.675 GM/100ML-% IV SOLN
INTRAVENOUS | Status: AC
  Administered 2021-06-28: 2 g via INTRAVENOUS
  Filled 2021-06-28: qty 100

## 2021-06-28 MED ORDER — ACD FORMULA A 0.73-2.45-2.2 GM/100ML VI SOLN
Status: AC
  Administered 2021-06-28: 1000 mL
  Filled 2021-06-28: qty 1000

## 2021-06-28 MED ORDER — ANTICOAGULANT SODIUM CITRATE 4% (200MG/5ML) IV SOLN
5.0000 mL | Freq: Once | Status: AC
  Administered 2021-06-28: 5 mL
  Filled 2021-06-28: qty 5

## 2021-06-28 MED ORDER — ACD FORMULA A 0.73-2.45-2.2 GM/100ML VI SOLN: 1000.0000 mL | Status: DC

## 2021-06-28 MED ORDER — CALCIUM GLUCONATE-NACL 2-0.675 GM/100ML-% IV SOLN: 2.0000 g | Freq: Once | INTRAVENOUS | Status: AC

## 2021-06-28 NOTE — Telephone Encounter (Signed)
Notified nurse Keni in dialysis unit that patient need pheresis MWF this week and cbc/diff, CMP, LDH with each treatment. May need procedure on Tuesday also based on Monday's labs.

## 2021-06-28 NOTE — Telephone Encounter (Signed)
MD reviewed labs today. No need for pheresis tomorrow. Schedule for Wed/Friday w/same labs. Dialysis team notified.

## 2021-06-28 NOTE — Progress Notes (Signed)
Therapeutic Plasma Exchange completed without issue. Pt tolerated well. Pt alert discharged via wheelchair by this RN.  Pt will Schedule TPE for Wednesday 11:00 am.

## 2021-06-28 NOTE — Discharge Instructions (Signed)
Follow up outpatient physician.

## 2021-06-29 ENCOUNTER — Inpatient Hospital Stay (HOSPITAL_BASED_OUTPATIENT_CLINIC_OR_DEPARTMENT_OTHER): Payer: Medicare Other | Admitting: Oncology

## 2021-06-29 ENCOUNTER — Inpatient Hospital Stay: Payer: Medicare Other | Attending: Oncology

## 2021-06-29 VITALS — BP 125/58 | HR 80 | Temp 98.7°F | Resp 19 | Ht 64.0 in | Wt 176.2 lb

## 2021-06-29 DIAGNOSIS — C679 Malignant neoplasm of bladder, unspecified: Secondary | ICD-10-CM | POA: Diagnosis not present

## 2021-06-29 DIAGNOSIS — J449 Chronic obstructive pulmonary disease, unspecified: Secondary | ICD-10-CM | POA: Insufficient documentation

## 2021-06-29 DIAGNOSIS — Z79899 Other long term (current) drug therapy: Secondary | ICD-10-CM | POA: Diagnosis not present

## 2021-06-29 DIAGNOSIS — G8929 Other chronic pain: Secondary | ICD-10-CM | POA: Insufficient documentation

## 2021-06-29 DIAGNOSIS — D649 Anemia, unspecified: Secondary | ICD-10-CM | POA: Diagnosis not present

## 2021-06-29 DIAGNOSIS — M3119 Other thrombotic microangiopathy: Secondary | ICD-10-CM | POA: Diagnosis not present

## 2021-06-29 DIAGNOSIS — N289 Disorder of kidney and ureter, unspecified: Secondary | ICD-10-CM | POA: Diagnosis not present

## 2021-06-29 DIAGNOSIS — M549 Dorsalgia, unspecified: Secondary | ICD-10-CM | POA: Insufficient documentation

## 2021-06-29 LAB — CMP (CANCER CENTER ONLY)
ALT: 37 U/L (ref 0–44)
AST: 29 U/L (ref 15–41)
Albumin: 3.9 g/dL (ref 3.5–5.0)
Alkaline Phosphatase: 69 U/L (ref 38–126)
Anion gap: 8 (ref 5–15)
BUN: 31 mg/dL — ABNORMAL HIGH (ref 8–23)
CO2: 30 mmol/L (ref 22–32)
Calcium: 9.4 mg/dL (ref 8.9–10.3)
Chloride: 101 mmol/L (ref 98–111)
Creatinine: 1.21 mg/dL — ABNORMAL HIGH (ref 0.44–1.00)
GFR, Estimated: 46 mL/min — ABNORMAL LOW (ref >60)
Glucose, Bld: 107 mg/dL — ABNORMAL HIGH (ref 70–99)
Potassium: 5.6 mmol/L — ABNORMAL HIGH (ref 3.5–5.1)
Sodium: 139 mmol/L (ref 135–145)
Total Bilirubin: 0.6 mg/dL (ref 0.3–1.2)
Total Protein: 5.8 g/dL — ABNORMAL LOW (ref 6.5–8.1)

## 2021-06-29 LAB — THERAPEUTIC PLASMA EXCHANGE (BLOOD BANK)
Plasma Exchange: 2200
Plasma volume needed: 2200
Unit division: 0

## 2021-06-29 LAB — CBC WITH DIFFERENTIAL (CANCER CENTER ONLY)
Abs Immature Granulocytes: 0.51 10*3/uL — ABNORMAL HIGH (ref 0.00–0.07)
Basophils Absolute: 0.1 10*3/uL (ref 0.0–0.1)
Basophils Relative: 1 %
Eosinophils Absolute: 0.1 10*3/uL (ref 0.0–0.5)
Eosinophils Relative: 1 %
HCT: 44.7 % (ref 36.0–46.0)
Hemoglobin: 14.9 g/dL (ref 12.0–15.0)
Immature Granulocytes: 5 %
Lymphocytes Relative: 9 %
Lymphs Abs: 0.9 10*3/uL (ref 0.7–4.0)
MCH: 32.6 pg (ref 26.0–34.0)
MCHC: 33.3 g/dL (ref 30.0–36.0)
MCV: 97.8 fL (ref 80.0–100.0)
Monocytes Absolute: 0.8 10*3/uL (ref 0.1–1.0)
Monocytes Relative: 8 %
Neutro Abs: 7.1 10*3/uL (ref 1.7–7.7)
Neutrophils Relative %: 76 %
Platelet Count: 141 10*3/uL — ABNORMAL LOW (ref 150–400)
RBC: 4.57 MIL/uL (ref 3.87–5.11)
RDW: 13.4 % (ref 11.5–15.5)
WBC Count: 9.5 10*3/uL (ref 4.0–10.5)
nRBC: 0.2 % (ref 0.0–0.2)

## 2021-06-29 LAB — LACTATE DEHYDROGENASE: LDH: 239 U/L — ABNORMAL HIGH (ref 98–192)

## 2021-06-29 LAB — RETIC PANEL
Immature Retic Fract: 13.6 % (ref 2.3–15.9)
RBC.: 4.6 MIL/uL (ref 3.87–5.11)
Retic Count, Absolute: 98 10*3/uL (ref 19.0–186.0)
Retic Ct Pct: 2.1 % (ref 0.4–3.1)
Reticulocyte Hemoglobin: 37.6 pg (ref >27.9)

## 2021-06-29 NOTE — Progress Notes (Signed)
[facility] OFFICE PROGRESS NOTE   Diagnosis: TTP  INTERVAL HISTORY:   [name] was discharged from the hospital 06/26/2021 after completing another plasmapheresis.  She reports tolerating the procedure well.  No bleeding.  Her neurologic status has returned to baseline.  She completed another plasmapheresis procedure yesterday. She is taking prednisone, 20 mg daily at present.  Objective:  Vital signs in last 24 hours:  Blood pressure (!) 125/58, pulse 80, temperature 98.7 F (37.1 C), temperature source Oral, resp. rate 19, height 5\' 4"  (1.626 m), weight 176 lb 3.2 oz (79.9 kg), SpO2 96 %.    HEENT: No thrush Resp: Distant breath sounds, no respiratory distress Cardio: Regular rate and rhythm GI: No hepatosplenomegaly Vascular: No leg edema    Left upper chest dialysis catheter site with a surrounding ecchymosis.  Lab Results:  Lab Results  Component Value Date   WBC 9.5 06/29/2021   HGB 14.9 06/29/2021   HCT 44.7 06/29/2021   MCV 97.8 06/29/2021   PLT 141 (L) 06/29/2021   NEUTROABS 7.1 06/29/2021    CMP  Lab Results  Component Value Date   NA 139 06/29/2021   K 5.6 (H) 06/29/2021   CL 101 06/29/2021   CO2 30 06/29/2021   GLUCOSE 107 (H) 06/29/2021   BUN 31 (H) 06/29/2021   CREATININE 1.21 (H) 06/29/2021   CALCIUM 9.4 06/29/2021   PROT 5.8 (L) 06/29/2021   ALBUMIN 3.9 06/29/2021   AST 29 06/29/2021   ALT 37 06/29/2021   ALKPHOS 69 06/29/2021   BILITOT 0.6 06/29/2021   GFRNONAA 46 (L) 06/29/2021   GFRAA 50 (L) 11/25/2019    No results found for: CEA1, CEA, [id number], CA125  Lab Results  Component Value Date   INR 1.3 (H) 06/20/2021   LABPROT 15.7 (H) 06/20/2021    Imaging:  IR US Guide Vasc Access Left  Result Date: 06/25/2021 CLINICAL DATA:  History of TTP and status post non tunneled pheresis catheter placement on 06/20/2021 via left external jugular vein. The patient now requires placement of a tunneled catheter for longer-term  plasmapheresis. EXAM: TUNNELED CENTRAL VENOUS PHERESIS/HEMODIALYSIS CATHETER PLACEMENT WITH ULTRASOUND AND FLUOROSCOPIC GUIDANCE ANESTHESIA/SEDATION: Moderate (conscious) sedation was employed during this procedure. A total of Versed 1.5 mg and Fentanyl 75 mcg was administered intravenously by radiology nursing. Moderate Sedation Time: 38 minutes. The patient's level of consciousness and vital signs were monitored continuously by radiology nursing throughout the procedure under my direct supervision. MEDICATIONS: 1 g IV vancomycin FLUOROSCOPY: 3 minutes and 24 seconds.  24.0 mGy. PROCEDURE: The procedure, risks, benefits, and alternatives were explained to the patient. Questions regarding the procedure were encouraged and answered. The patient understands and consents to the procedure. A timeout was performed prior to initiating the procedure. The left neck and chest were prepped with chlorhexidine in a sterile fashion, and a sterile drape was applied covering the operative field. Maximum barrier sterile technique with sterile gowns and gloves were used for the procedure. Local anesthesia was provided with 1% lidocaine. After creating a small venotomy incision, a 21 gauge needle was advanced into the left external jugular vein under direct, real-time ultrasound guidance. Ultrasound image documentation was performed with a permanent recorded ultrasound image. After securing guidewire access a 5 French catheter was advanced to assist in advancing guidewire access to the level of the SVC. A wire was kinked to measure appropriate catheter length. A Palindrome tunneled hemodialysis catheter measuring 28 cm from tip to cuff was chosen for placement. This  was tunneled in a retrograde fashion from the chest wall to the venotomy incision. At the venotomy, serial dilatation was performed and a 15 Fr peel-away sheath was placed over a guidewire. The catheter was then placed through the sheath and the sheath removed. Final  catheter positioning was confirmed and documented with a fluoroscopic spot image. The catheter was aspirated, flushed with saline, and injected with appropriate volume sodium citrate dwells. The venotomy incision was closed with subcuticular 4-0 Vicryl. Dermabond was applied to the incision. The catheter exit site was secured with 0-Prolene retention sutures. After tunnel catheter placement, the non tunneled temporary pheresis catheter was removed and manual compression held over the catheter exit site. COMPLICATIONS: None.  No pneumothorax. 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 After catheter placement, the tip lies in the right atrium. The catheter aspirates normally and is ready for immediate use. IMPRESSION: Placement of tunneled hemodialysis catheter via the left external jugular vein. The catheter tip lies in the right atrium. The catheter is ready for immediate use. The temporary non tunneled pheresis catheter was removed after new tunnel catheter placement. Electronically Signed   By: Aletta Edouard M.D.   On: 06/25/2021 16:45    Medications: I have reviewed the patient's current medications.   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-plasma exchange, steroids, rituximab Relapse of TTP March 2015-plasma exchange, steroids, rituximab, and maintenance azathioprine Admission with severe thrombocytopenia and elevated LDH 07/16/2020, ADAMTS13 activity less than 2%, consistent with relapse of TTP       -Steroids/FFP given 07/16/2020       -Daily plasma exchange beginning 07/17/2020, 7 days, then qod starting 3/24, discharge 07/21/2020 on prednisone 60 mg daily       -Prednisone taper to 40 mg daily 07/29/2020       -rituximab 07/30/2020, 08/06/2020, 08/13/2020, 08/20/2020       -plasmapheresis on 07/31/2020,  08/03/2020, 08/05/2020, and 08/07/2020       -prednisone increased to 60 mg daily 08/17/2020       -Plasmapheresis 08/24/2020, 08/25/2020, 08/26/2020, 08/27/2020, 08/28/2020, 08/29/2020, 08/31/2020, 09/02/2020, 09/04/2020       -caplacizumab initiated 08/24/2020       -Decrease prednisone to 40 mg daily 08/28/2020       -ADAMTS13 37% on 09/04/2020       -Prednisone decreased to 30 mg daily 09/07/2020       -Continue caplacizumab       -prednisone decreased to 20 mg daily beginning 09/18/2020       -Cablivi discontinued 09/21/2020       -prednisone discontinued 09/24/2020, resumed at a dose of 20 mg daily on 10/01/2020       -prednisone taper to 10 mg daily 10/23/2020       -Prednisone taper to 5 mg daily 11/06/2020       -prednisone continued at a dose of 5 mg daily, Imuran resumed at 50 mg daily 12/03/2020       - admission has 06/19/2021 with relapse of TTP, daily plasma exchange and Solu-Medrol beginning 06/20/2021, 1 dose of caplacizumab 06/20/2021, discharged to home on 60 mg daily prednisone 06/26/2021, Monday, Wednesday, Friday plasmapheresis starting 06/28/2021   History of epidural abscess requiring laminectomy 2012 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 Cristina Gong- no active bleeding or blood in the stomach, nor any prospective bleeding site, seen on exam. 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  She was discharged from the hospital 06/26/2021 after completing 7  treatments with daily plasmapheresis.  The platelet count continues to improve.  She will increase the prednisone to 60 mg daily.  The plan is to perform plasmapheresis Monday, Wednesday, and Friday this week.  She will have labs with each plasmapheresis procedure. The potassium level returned elevated today.  She will have a repeat chemistry panel tomorrow.  She will return for an office visit 07/05/2021.  She will follow-up with urology for management of the noninvasive bladder tumor.  Betsy Coder, MD  06/29/2021  12:20 PM

## 2021-06-30 ENCOUNTER — Non-Acute Institutional Stay (HOSPITAL_COMMUNITY)
Admission: AD | Admit: 2021-06-30 | Discharge: 2021-06-30 | Disposition: A | Discharge status: Home / Self Care | Payer: Medicare Other | Source: Ambulatory Visit | Attending: Oncology | Admitting: Oncology

## 2021-06-30 DIAGNOSIS — M3119 Other thrombotic microangiopathy: Secondary | ICD-10-CM | POA: Diagnosis present

## 2021-06-30 LAB — CBC
HCT: 40.2 % (ref 36.0–46.0)
Hemoglobin: 13.7 g/dL (ref 12.0–15.0)
MCH: 33 pg (ref 26.0–34.0)
MCHC: 34.1 g/dL (ref 30.0–36.0)
MCV: 96.9 fL (ref 80.0–100.0)
Platelets: 158 10*3/uL (ref 150–400)
RBC: 4.15 MIL/uL (ref 3.87–5.11)
RDW: 13.5 % (ref 11.5–15.5)
WBC: 8 10*3/uL (ref 4.0–10.5)
nRBC: 0 % (ref 0.0–0.2)

## 2021-06-30 LAB — COMPREHENSIVE METABOLIC PANEL
ALT: 32 U/L (ref 0–44)
AST: 27 U/L (ref 15–41)
Albumin: 3.2 g/dL — ABNORMAL LOW (ref 3.5–5.0)
Alkaline Phosphatase: 56 U/L (ref 38–126)
Anion gap: 14 (ref 5–15)
BUN: 20 mg/dL (ref 8–23)
CO2: 27 mmol/L (ref 22–32)
Calcium: 8.3 mg/dL — ABNORMAL LOW (ref 8.9–10.3)
Chloride: 104 mmol/L (ref 98–111)
Creatinine, Ser: 1.12 mg/dL — ABNORMAL HIGH (ref 0.44–1.00)
GFR, Estimated: 50 mL/min — ABNORMAL LOW (ref >60)
Glucose, Bld: 92 mg/dL (ref 70–99)
Potassium: 4.3 mmol/L (ref 3.5–5.1)
Sodium: 145 mmol/L (ref 135–145)
Total Bilirubin: 0.4 mg/dL (ref 0.3–1.2)
Total Protein: 5.2 g/dL — ABNORMAL LOW (ref 6.5–8.1)

## 2021-06-30 LAB — ADAMTS13 ACTIVITY: Adamts 13 Activity: 50 % — ABNORMAL LOW (ref >66.8)

## 2021-06-30 LAB — LACTATE DEHYDROGENASE: LDH: 220 U/L — ABNORMAL HIGH (ref 98–192)

## 2021-06-30 LAB — ADAMTS13 ACTIVITY REFLEX

## 2021-06-30 MED ORDER — ACETAMINOPHEN 325 MG PO TABS
650.0000 mg | ORAL_TABLET | ORAL | Status: DC | PRN
  Administered 2021-06-30: 650 mg via ORAL
  Filled 2021-06-30: qty 2

## 2021-06-30 MED ORDER — ACD FORMULA A 0.73-2.45-2.2 GM/100ML VI SOLN
1000.0000 mL | Status: DC
  Administered 2021-06-30: 1000 mL
  Filled 2021-06-30: qty 1000

## 2021-06-30 MED ORDER — ANTICOAGULANT SODIUM CITRATE 4% (200MG/5ML) IV SOLN
5.0000 mL | Freq: Once | Status: AC
  Administered 2021-06-30: 5 mL
  Filled 2021-06-30: qty 5

## 2021-06-30 MED ORDER — DIPHENHYDRAMINE HCL 25 MG PO CAPS
25.0000 mg | ORAL_CAPSULE | Freq: Four times a day (QID) | ORAL | Status: DC | PRN
  Administered 2021-06-30: 25 mg via ORAL
  Filled 2021-06-30: qty 1

## 2021-06-30 MED ORDER — CALCIUM CARBONATE ANTACID 500 MG PO CHEW
2.0000 | CHEWABLE_TABLET | ORAL | Status: AC
  Administered 2021-06-30: 400 mg via ORAL
  Filled 2021-06-30: qty 2

## 2021-06-30 MED ORDER — CALCIUM GLUCONATE-NACL 2-0.675 GM/100ML-% IV SOLN
2.0000 g | Freq: Once | INTRAVENOUS | Status: AC
  Administered 2021-06-30: 2000 mg via INTRAVENOUS
  Filled 2021-06-30: qty 100

## 2021-06-30 NOTE — Progress Notes (Signed)
Therapeutic Plasma Exchange complete w/o adverse event. Patient departed unit alert, oriented vitals stable, w/o complaint via wheelchair by [facility] to a waiting car. Labs drawn. Next treatment is on 07/02/21. ?

## 2021-07-02 ENCOUNTER — Encounter: Payer: Self-pay | Admitting: *Deleted

## 2021-07-02 ENCOUNTER — Non-Acute Institutional Stay (HOSPITAL_COMMUNITY)
Admission: RE | Admit: 2021-07-02 | Discharge: 2021-07-02 | Disposition: A | Discharge status: Home / Self Care | Payer: Medicare Other | Attending: Oncology | Admitting: Oncology

## 2021-07-02 DIAGNOSIS — M3119 Other thrombotic microangiopathy: Secondary | ICD-10-CM | POA: Diagnosis not present

## 2021-07-02 LAB — THERAPEUTIC PLASMA EXCHANGE (BLOOD BANK)
Plasma Exchange: 2200
Plasma volume needed: 2200
Unit division: 0
Unit division: 0
Unit division: 0
Unit division: 0
Unit division: 0
Unit division: 0

## 2021-07-02 LAB — CBC
HCT: 40.4 % (ref 36.0–46.0)
Hemoglobin: 13.7 g/dL (ref 12.0–15.0)
MCH: 33.7 pg (ref 26.0–34.0)
MCHC: 33.9 g/dL (ref 30.0–36.0)
MCV: 99.3 fL (ref 80.0–100.0)
Platelets: 188 10*3/uL (ref 150–400)
RBC: 4.07 MIL/uL (ref 3.87–5.11)
RDW: 14.1 % (ref 11.5–15.5)
WBC: 10.8 10*3/uL — ABNORMAL HIGH (ref 4.0–10.5)
nRBC: 0 % (ref 0.0–0.2)

## 2021-07-02 LAB — COMPREHENSIVE METABOLIC PANEL
ALT: 27 U/L (ref 0–44)
AST: 27 U/L (ref 15–41)
Albumin: 3.2 g/dL — ABNORMAL LOW (ref 3.5–5.0)
Alkaline Phosphatase: 55 U/L (ref 38–126)
Anion gap: 7 (ref 5–15)
BUN: 22 mg/dL (ref 8–23)
CO2: 27 mmol/L (ref 22–32)
Calcium: 9 mg/dL (ref 8.9–10.3)
Chloride: 105 mmol/L (ref 98–111)
Creatinine, Ser: 1.32 mg/dL — ABNORMAL HIGH (ref 0.44–1.00)
GFR, Estimated: 41 mL/min — ABNORMAL LOW (ref >60)
Glucose, Bld: 115 mg/dL — ABNORMAL HIGH (ref 70–99)
Potassium: 4.2 mmol/L (ref 3.5–5.1)
Sodium: 139 mmol/L (ref 135–145)
Total Bilirubin: 0.5 mg/dL (ref 0.3–1.2)
Total Protein: 5.3 g/dL — ABNORMAL LOW (ref 6.5–8.1)

## 2021-07-02 LAB — LACTATE DEHYDROGENASE: LDH: 206 U/L — ABNORMAL HIGH (ref 98–192)

## 2021-07-02 MED ORDER — ANTICOAGULANT SODIUM CITRATE 4% (200MG/5ML) IV SOLN
5.0000 mL | Freq: Once | Status: AC
  Administered 2021-07-02: 5 mL
  Filled 2021-07-02: qty 5

## 2021-07-02 MED ORDER — DIPHENHYDRAMINE HCL 25 MG PO CAPS
25.0000 mg | ORAL_CAPSULE | Freq: Four times a day (QID) | ORAL | Status: DC | PRN
  Administered 2021-07-02: 25 mg via ORAL
  Filled 2021-07-02: qty 1

## 2021-07-02 MED ORDER — CALCIUM GLUCONATE-NACL 2-0.675 GM/100ML-% IV SOLN
2.0000 g | Freq: Once | INTRAVENOUS | Status: AC
  Administered 2021-07-02: 2000 mg via INTRAVENOUS
  Filled 2021-07-02: qty 100

## 2021-07-02 MED ORDER — ACETAMINOPHEN 325 MG PO TABS
650.0000 mg | ORAL_TABLET | ORAL | Status: DC | PRN
  Administered 2021-07-02: 650 mg via ORAL
  Filled 2021-07-02: qty 2

## 2021-07-02 MED ORDER — CALCIUM CARBONATE ANTACID 500 MG PO CHEW
2.0000 | CHEWABLE_TABLET | ORAL | Status: AC
  Administered 2021-07-02: 400 mg via ORAL
  Filled 2021-07-02: qty 2

## 2021-07-02 MED ORDER — ACD FORMULA A 0.73-2.45-2.2 GM/100ML VI SOLN
1000.0000 mL | Status: DC
  Administered 2021-07-02: 1000 mL
  Filled 2021-07-02: qty 1000

## 2021-07-02 MED ORDER — CALCIUM GLUCONATE-NACL 2-0.675 GM/100ML-% IV SOLN
INTRAVENOUS | Status: AC
  Filled 2021-07-02: qty 100

## 2021-07-02 NOTE — Progress Notes (Signed)
Therapeutic Plasma Exchange Complete. Patient tolerated treatment w/o adverse event. Patient departed unit alert oriented vitals stable w/o complaint via wheelchair to a waiting car by this RN. She is aware to maintain 60 mg of prednisone and to return @ 11 on Monday and Wednesday for treatment.  ?

## 2021-07-02 NOTE — Progress Notes (Signed)
MD reviewed labs today. Notified nurse Vonshell at [facility] dialysis unit to put her on scheduled for Monday and Wednesday for plasmapheresis and same labs. Requested she till patient to continue prednisone 60 mg daily. ?

## 2021-07-03 LAB — THERAPEUTIC PLASMA EXCHANGE (BLOOD BANK)
Plasma volume needed: 2200
Unit division: 0
Unit division: 0
Unit division: 0
Unit division: 0

## 2021-07-05 ENCOUNTER — Non-Acute Institutional Stay (HOSPITAL_COMMUNITY)
Admission: RE | Admit: 2021-07-05 | Discharge: 2021-07-05 | Disposition: A | Discharge status: Home / Self Care | Payer: Medicare Other | Source: Ambulatory Visit | Attending: Oncology | Admitting: Oncology

## 2021-07-05 DIAGNOSIS — M3119 Other thrombotic microangiopathy: Secondary | ICD-10-CM | POA: Diagnosis present

## 2021-07-05 LAB — COMPREHENSIVE METABOLIC PANEL
ALT: 25 U/L (ref 0–44)
AST: 28 U/L (ref 15–41)
Albumin: 3.2 g/dL — ABNORMAL LOW (ref 3.5–5.0)
Alkaline Phosphatase: 55 U/L (ref 38–126)
Anion gap: 12 (ref 5–15)
BUN: 22 mg/dL (ref 8–23)
CO2: 25 mmol/L (ref 22–32)
Calcium: 8.2 mg/dL — ABNORMAL LOW (ref 8.9–10.3)
Chloride: 104 mmol/L (ref 98–111)
Creatinine, Ser: 1.11 mg/dL — ABNORMAL HIGH (ref 0.44–1.00)
GFR, Estimated: 51 mL/min — ABNORMAL LOW (ref >60)
Glucose, Bld: 111 mg/dL — ABNORMAL HIGH (ref 70–99)
Potassium: 4.2 mmol/L (ref 3.5–5.1)
Sodium: 141 mmol/L (ref 135–145)
Total Bilirubin: 0.7 mg/dL (ref 0.3–1.2)
Total Protein: 5.4 g/dL — ABNORMAL LOW (ref 6.5–8.1)

## 2021-07-05 LAB — CBC
HCT: 40.7 % (ref 36.0–46.0)
Hemoglobin: 13.7 g/dL (ref 12.0–15.0)
MCH: 33.6 pg (ref 26.0–34.0)
MCHC: 33.7 g/dL (ref 30.0–36.0)
MCV: 99.8 fL (ref 80.0–100.0)
Platelets: 175 10*3/uL (ref 150–400)
RBC: 4.08 MIL/uL (ref 3.87–5.11)
RDW: 14.3 % (ref 11.5–15.5)
WBC: 7.9 10*3/uL (ref 4.0–10.5)
nRBC: 0 % (ref 0.0–0.2)

## 2021-07-05 LAB — LACTATE DEHYDROGENASE: LDH: 230 U/L — ABNORMAL HIGH (ref 98–192)

## 2021-07-05 MED ORDER — ANTICOAGULANT SODIUM CITRATE 4% (200MG/5ML) IV SOLN
5.0000 mL | Freq: Once | Status: AC
  Administered 2021-07-05: 5 mL
  Filled 2021-07-05: qty 5

## 2021-07-05 MED ORDER — DIPHENHYDRAMINE HCL 25 MG PO CAPS
25.0000 mg | ORAL_CAPSULE | Freq: Four times a day (QID) | ORAL | Status: DC | PRN
  Administered 2021-07-05: 25 mg via ORAL
  Filled 2021-07-05: qty 1

## 2021-07-05 MED ORDER — ACD FORMULA A 0.73-2.45-2.2 GM/100ML VI SOLN
1000.0000 mL | Status: DC
  Filled 2021-07-05: qty 1000

## 2021-07-05 MED ORDER — CALCIUM GLUCONATE-NACL 2-0.675 GM/100ML-% IV SOLN
2.0000 g | Freq: Once | INTRAVENOUS | Status: AC
  Administered 2021-07-05: 2000 mg via INTRAVENOUS
  Filled 2021-07-05: qty 100

## 2021-07-05 MED ORDER — ACETAMINOPHEN 325 MG PO TABS
650.0000 mg | ORAL_TABLET | ORAL | Status: DC | PRN
  Administered 2021-07-05: 650 mg via ORAL
  Filled 2021-07-05: qty 2

## 2021-07-05 MED ORDER — CALCIUM CARBONATE ANTACID 500 MG PO CHEW
2.0000 | CHEWABLE_TABLET | ORAL | Status: AC
  Administered 2021-07-05 (×2): 400 mg via ORAL
  Filled 2021-07-05: qty 2

## 2021-07-05 NOTE — Progress Notes (Signed)
Therapeutic Plasma Exchange Complete. Patient tolerated treatment w/o adverse event. Patient departed unit alert oriented vitals stable w/o complaint via wheelchair to a waiting car by this RN. She is aware to return on Wednesday @ 11 am for treatment.  ?

## 2021-07-06 LAB — THERAPEUTIC PLASMA EXCHANGE (BLOOD BANK)
Plasma Exchange: 2200
Plasma volume needed: 2200
Unit division: 0
Unit division: 0
Unit division: 0

## 2021-07-07 ENCOUNTER — Non-Acute Institutional Stay (HOSPITAL_COMMUNITY)
Admission: RE | Admit: 2021-07-07 | Discharge: 2021-07-07 | Disposition: A | Discharge status: Home / Self Care | Payer: Medicare Other | Source: Ambulatory Visit | Attending: Oncology | Admitting: Oncology

## 2021-07-07 ENCOUNTER — Ambulatory Visit (HOSPITAL_COMMUNITY): Admission: RE | Admit: 2021-07-07 | Payer: Medicare Other | Source: Ambulatory Visit

## 2021-07-07 DIAGNOSIS — M3119 Other thrombotic microangiopathy: Secondary | ICD-10-CM | POA: Diagnosis present

## 2021-07-07 LAB — COMPREHENSIVE METABOLIC PANEL
ALT: 18 U/L (ref 0–44)
AST: 24 U/L (ref 15–41)
Albumin: 3 g/dL — ABNORMAL LOW (ref 3.5–5.0)
Alkaline Phosphatase: 49 U/L (ref 38–126)
Anion gap: 9 (ref 5–15)
BUN: 23 mg/dL (ref 8–23)
CO2: 28 mmol/L (ref 22–32)
Calcium: 8.7 mg/dL — ABNORMAL LOW (ref 8.9–10.3)
Chloride: 104 mmol/L (ref 98–111)
Creatinine, Ser: 1.03 mg/dL — ABNORMAL HIGH (ref 0.44–1.00)
GFR, Estimated: 55 mL/min — ABNORMAL LOW (ref >60)
Glucose, Bld: 87 mg/dL (ref 70–99)
Potassium: 3.9 mmol/L (ref 3.5–5.1)
Sodium: 141 mmol/L (ref 135–145)
Total Bilirubin: 0.6 mg/dL (ref 0.3–1.2)
Total Protein: 5 g/dL — ABNORMAL LOW (ref 6.5–8.1)

## 2021-07-07 LAB — CBC
HCT: 39.5 % (ref 36.0–46.0)
Hemoglobin: 12.8 g/dL (ref 12.0–15.0)
MCH: 32.6 pg (ref 26.0–34.0)
MCHC: 32.4 g/dL (ref 30.0–36.0)
MCV: 100.5 fL — ABNORMAL HIGH (ref 80.0–100.0)
Platelets: 154 10*3/uL (ref 150–400)
RBC: 3.93 MIL/uL (ref 3.87–5.11)
RDW: 14.6 % (ref 11.5–15.5)
WBC: 5.6 10*3/uL (ref 4.0–10.5)
nRBC: 0 % (ref 0.0–0.2)

## 2021-07-07 LAB — LACTATE DEHYDROGENASE: LDH: 210 U/L — ABNORMAL HIGH (ref 98–192)

## 2021-07-07 MED ORDER — DIPHENHYDRAMINE HCL 25 MG PO CAPS
25.0000 mg | ORAL_CAPSULE | Freq: Four times a day (QID) | ORAL | Status: DC | PRN
  Administered 2021-07-07: 25 mg via ORAL
  Filled 2021-07-07: qty 1

## 2021-07-07 MED ORDER — ANTICOAGULANT SODIUM CITRATE 4% (200MG/5ML) IV SOLN
5.0000 mL | Freq: Once | Status: AC
  Administered 2021-07-07: 5 mL
  Filled 2021-07-07: qty 5

## 2021-07-07 MED ORDER — CALCIUM GLUCONATE-NACL 2-0.675 GM/100ML-% IV SOLN
2.0000 g | Freq: Once | INTRAVENOUS | Status: AC
  Administered 2021-07-07: 2000 mg via INTRAVENOUS
  Filled 2021-07-07: qty 100

## 2021-07-07 MED ORDER — CALCIUM CARBONATE ANTACID 500 MG PO CHEW
2.0000 | CHEWABLE_TABLET | ORAL | Status: AC
  Administered 2021-07-07 (×2): 400 mg via ORAL
  Filled 2021-07-07: qty 2

## 2021-07-07 MED ORDER — ACETAMINOPHEN 325 MG PO TABS
650.0000 mg | ORAL_TABLET | ORAL | Status: DC | PRN
  Administered 2021-07-07: 650 mg via ORAL
  Filled 2021-07-07: qty 2

## 2021-07-07 MED ORDER — ACD FORMULA A 0.73-2.45-2.2 GM/100ML VI SOLN
1000.0000 mL | Status: DC
  Administered 2021-07-07: 1000 mL
  Filled 2021-07-07: qty 1000

## 2021-07-07 NOTE — Progress Notes (Signed)
Therapeutic Plasma Exchange Complete. Patient tolerated well w/o adverse events. Not yet received response from Dr. Benay Spice office regarding future treatments. Patient departed unit alert oriented vitals stable w/o complaint via wheelchair to a waiting car by this RN. ?

## 2021-07-08 ENCOUNTER — Other Ambulatory Visit: Payer: Self-pay

## 2021-07-08 ENCOUNTER — Inpatient Hospital Stay: Payer: Medicare Other

## 2021-07-08 ENCOUNTER — Inpatient Hospital Stay (HOSPITAL_BASED_OUTPATIENT_CLINIC_OR_DEPARTMENT_OTHER): Payer: Medicare Other | Admitting: Nurse Practitioner

## 2021-07-08 ENCOUNTER — Encounter: Payer: Self-pay | Admitting: Nurse Practitioner

## 2021-07-08 ENCOUNTER — Inpatient Hospital Stay: Payer: Medicare Other | Attending: Oncology

## 2021-07-08 VITALS — BP 138/55 | HR 98 | Temp 97.8°F | Resp 18 | Ht 64.0 in | Wt 169.0 lb

## 2021-07-08 DIAGNOSIS — Z79899 Other long term (current) drug therapy: Secondary | ICD-10-CM | POA: Insufficient documentation

## 2021-07-08 DIAGNOSIS — D649 Anemia, unspecified: Secondary | ICD-10-CM | POA: Insufficient documentation

## 2021-07-08 DIAGNOSIS — J449 Chronic obstructive pulmonary disease, unspecified: Secondary | ICD-10-CM | POA: Insufficient documentation

## 2021-07-08 DIAGNOSIS — G8929 Other chronic pain: Secondary | ICD-10-CM | POA: Insufficient documentation

## 2021-07-08 DIAGNOSIS — R7989 Other specified abnormal findings of blood chemistry: Secondary | ICD-10-CM | POA: Diagnosis not present

## 2021-07-08 DIAGNOSIS — Z5112 Encounter for antineoplastic immunotherapy: Secondary | ICD-10-CM | POA: Diagnosis present

## 2021-07-08 DIAGNOSIS — N289 Disorder of kidney and ureter, unspecified: Secondary | ICD-10-CM | POA: Insufficient documentation

## 2021-07-08 DIAGNOSIS — C679 Malignant neoplasm of bladder, unspecified: Secondary | ICD-10-CM | POA: Insufficient documentation

## 2021-07-08 DIAGNOSIS — M3119 Other thrombotic microangiopathy: Secondary | ICD-10-CM | POA: Insufficient documentation

## 2021-07-08 DIAGNOSIS — M549 Dorsalgia, unspecified: Secondary | ICD-10-CM | POA: Diagnosis not present

## 2021-07-08 DIAGNOSIS — R911 Solitary pulmonary nodule: Secondary | ICD-10-CM | POA: Diagnosis not present

## 2021-07-08 DIAGNOSIS — R7402 Elevation of levels of lactic acid dehydrogenase (LDH): Secondary | ICD-10-CM | POA: Insufficient documentation

## 2021-07-08 LAB — CBC WITH DIFFERENTIAL (CANCER CENTER ONLY)
Abs Immature Granulocytes: 0.07 10*3/uL (ref 0.00–0.07)
Basophils Absolute: 0 10*3/uL (ref 0.0–0.1)
Basophils Relative: 1 %
Eosinophils Absolute: 0 10*3/uL (ref 0.0–0.5)
Eosinophils Relative: 1 %
HCT: 44.3 % (ref 36.0–46.0)
Hemoglobin: 14.6 g/dL (ref 12.0–15.0)
Immature Granulocytes: 1 %
Lymphocytes Relative: 19 %
Lymphs Abs: 1.3 10*3/uL (ref 0.7–4.0)
MCH: 32.5 pg (ref 26.0–34.0)
MCHC: 33 g/dL (ref 30.0–36.0)
MCV: 98.7 fL (ref 80.0–100.0)
Monocytes Absolute: 0.4 10*3/uL (ref 0.1–1.0)
Monocytes Relative: 6 %
Neutro Abs: 5 10*3/uL (ref 1.7–7.7)
Neutrophils Relative %: 72 %
Platelet Count: 155 10*3/uL (ref 150–400)
RBC: 4.49 MIL/uL (ref 3.87–5.11)
RDW: 14.6 % (ref 11.5–15.5)
WBC Count: 6.9 10*3/uL (ref 4.0–10.5)
nRBC: 0 % (ref 0.0–0.2)

## 2021-07-08 LAB — THERAPEUTIC PLASMA EXCHANGE (BLOOD BANK)
Plasma Exchange: 2200
Plasma volume needed: 2200
Unit division: 0
Unit division: 0

## 2021-07-08 LAB — CEA (ACCESS): CEA (CHCC): 2.07 ng/mL (ref 0.00–5.00)

## 2021-07-08 LAB — LACTATE DEHYDROGENASE: LDH: 241 U/L — ABNORMAL HIGH (ref 98–192)

## 2021-07-08 MED ORDER — SODIUM CHLORIDE 0.9% FLUSH: 10.0000 mL | Freq: Once | INTRAVENOUS | Status: AC

## 2021-07-08 MED ORDER — HEPARIN SOD (PORK) LOCK FLUSH 100 UNIT/ML IV SOLN: 500.0000 [IU] | Freq: Once | INTRAVENOUS | Status: AC

## 2021-07-08 NOTE — Progress Notes (Signed)
[facility] OFFICE PROGRESS NOTE   Diagnosis: TTP  INTERVAL HISTORY:   Ms. Hoffmaster returns as scheduled.  She completed plasmapheresis 07/02/2021, 07/05/2021 and 07/07/2021.  She is taking prednisone 40 mg daily.  She is unsure how long she has been on this dose.  She denies bleeding.  No unusual headaches.  No new balance issues.  No falls.  Objective:  Vital signs in last 24 hours:  Blood pressure (!) 138/55, pulse 98, temperature 97.8 F (36.6 C), temperature source Oral, resp. rate 18, height '5\' 4"'$  (1.626 m), weight 169 lb (76.7 kg), SpO2 100 %.    HEENT: No thrush or ulcers. Resp: Distant breath sounds.  No respiratory distress. Cardio: Regular rate and rhythm. GI: No hepatosplenomegaly. Vascular: No leg edema. Left upper chest pheresis catheter site with surrounding ecchymosis.  Lab Results:  Lab Results  Component Value Date   WBC 6.9 07/08/2021   HGB 14.6 07/08/2021   HCT 44.3 07/08/2021   MCV 98.7 07/08/2021   PLT 155 07/08/2021   NEUTROABS 5.0 07/08/2021    Imaging:  No results found.  Medications: I have reviewed the patient's current medications.  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-plasma exchange, steroids, rituximab Relapse of TTP March 2015-plasma exchange, steroids, rituximab, and maintenance azathioprine Admission with severe thrombocytopenia and elevated LDH 07/16/2020, ADAMTS13 activity less than 2%, consistent with relapse of TTP       -Steroids/FFP given 07/16/2020       -Daily plasma exchange beginning 07/17/2020, 7 days, then qod starting 3/24, discharge 07/21/2020 on prednisone 60 mg daily       -Prednisone taper to 40 mg daily 07/29/2020       -rituximab 07/30/2020, 08/06/2020, 08/13/2020, 08/20/2020       -plasmapheresis on 07/31/2020, 08/03/2020, 08/05/2020, and 08/07/2020       -prednisone increased to 60 mg daily 08/17/2020       -Plasmapheresis  08/24/2020, 08/25/2020, 08/26/2020, 08/27/2020, 08/28/2020, 08/29/2020, 08/31/2020, 09/02/2020, 09/04/2020       -caplacizumab initiated 08/24/2020       -Decrease prednisone to 40 mg daily 08/28/2020       -ADAMTS13 37% on 09/04/2020       -Prednisone decreased to 30 mg daily 09/07/2020       -Continue caplacizumab       -prednisone decreased to 20 mg daily beginning 09/18/2020       -Cablivi discontinued 09/21/2020       -prednisone discontinued 09/24/2020, resumed at a dose of 20 mg daily on 10/01/2020       -prednisone taper to 10 mg daily 10/23/2020       -Prednisone taper to 5 mg daily 11/06/2020       -prednisone continued at a dose of 5 mg daily, Imuran resumed at 50 mg daily 12/03/2020       - admission has 06/19/2021 with relapse of TTP, daily plasma exchange and Solu-Medrol beginning 06/20/2021, 1 dose of caplacizumab 06/20/2021, discharged to home on 60 mg daily prednisone 06/26/2021, Monday, Wednesday, Friday plasmapheresis starting 06/28/2021; plasmapheresis 07/05/2021 and 07/07/2021, continue prednisone 40 mg daily   History of epidural abscess requiring laminectomy 2012 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 Cristina Gong- no active bleeding or  blood in the stomach, nor any prospective bleeding site, seen on exam. 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 Polyak appears stable.  She had plasmapheresis Monday and Wednesday of this week.  Hemoglobin and platelet count remain stable in normal  range.  We are placing plasmapheresis on hold.  She will continue prednisone 40 mg daily.  Planning to keep the pheresis catheter in place for a few more weeks.  She will return for lab and flush in 1 week.  We will see her in follow-up in 2 weeks.  Patient seen with Dr. Benay Spice.  [name] ANP/GNP-BC   07/08/2021  12:16 PM  This was a shared visit with [name].  Ms. Leon completed another plasmapheresis treatment yesterday.  The hemoglobin and platelet count are normal.  The  ADAMTS13 activity was higher last week.  No neurologic symptoms.  The plan is to discontinue plasmapheresis and continue a slow prednisone taper.  We will consider cyclosporine therapy if the platelet count falls.   I was present for greater than 50% of today's visit.  I performed medical decision making.  Julieanne Manson, MD

## 2021-07-13 ENCOUNTER — Ambulatory Visit (INDEPENDENT_AMBULATORY_CARE_PROVIDER_SITE_OTHER): Payer: Medicare Other | Admitting: Urology

## 2021-07-13 ENCOUNTER — Other Ambulatory Visit: Payer: Self-pay

## 2021-07-13 DIAGNOSIS — C672 Malignant neoplasm of lateral wall of bladder: Secondary | ICD-10-CM | POA: Diagnosis not present

## 2021-07-13 DIAGNOSIS — N200 Calculus of kidney: Secondary | ICD-10-CM | POA: Diagnosis not present

## 2021-07-13 NOTE — Progress Notes (Signed)
Virtual Visit via Telephone Note ? ?I connected with [name] on 07/13/21 at  3:00 PM EDT by telephone and verified that I am speaking with the correct person using two identifiers. ?  ?Patient location: Home ?Provider location: [facility] ? ? ?I discussed the limitations, risks, security and privacy concerns of performing an evaluation and management service by telephone and the availability of in person appointments. We discussed the impact of the COVID-19 pandemic on the healthcare system, and the importance of social distancing and reducing patient and provider exposure. I also discussed with the patient that there may be a patient responsible charge related to this service. The patient expressed understanding and agreed to proceed. ? ?Reason for visit: ?Discuss pathology results ? ?History of Present Illness: ?81 year old female who underwent right ureteroscopy and laser lithotripsy of a 3 mm stone as well as bladder biopsy and fulguration of a 1 cm bladder tumor with postop gemcitabine on 06/18/2021.  Her stent accidentally was removed 1 day postoperatively during a coughing spell, and she presented to [facility] with right-sided flank pain.  CT showed some moderate hydronephrosis on the right side but no stone fragments, and cultures were ultimately negative.  She developed a TTP flare and was transferred to [facility] for plasmapheresis. ? ?She has been doing much better since that time and denies any gross hematuria or flank pain currently.  Her pathology from bladder biopsy showed HG Ta noninvasive urothelial cell carcinoma.  We reviewed the AUA guidelines and treatment options, and with her age and comorbidities I think surveillance is very reasonable with her small noninvasive disease.  Using shared decision making, she opted for initial surveillance cystoscopy in 3 to 4 months. ? ? ?Follow Up: ?Surveillance cystoscopy 3 to 4 months, consider future surveillance every 6 to 12 months  with her comorbidities ?  ?I discussed the assessment and treatment plan with the patient. The patient was provided an opportunity to ask questions and all were answered. The patient agreed with the plan and demonstrated an understanding of the instructions. ?  ?The patient was advised to call back or seek an in-person evaluation if the symptoms worsen or if the condition fails to improve as anticipated. ? ?I provided 11 minutes of non-face-to-face time during this encounter. ? ? ?Billey Co, MD  ? ?

## 2021-07-15 ENCOUNTER — Other Ambulatory Visit: Payer: Self-pay

## 2021-07-15 ENCOUNTER — Inpatient Hospital Stay: Payer: Medicare Other

## 2021-07-15 ENCOUNTER — Other Ambulatory Visit: Payer: Self-pay | Admitting: Oncology

## 2021-07-15 DIAGNOSIS — Z5112 Encounter for antineoplastic immunotherapy: Secondary | ICD-10-CM | POA: Diagnosis not present

## 2021-07-15 DIAGNOSIS — M3119 Other thrombotic microangiopathy: Secondary | ICD-10-CM | POA: Diagnosis not present

## 2021-07-15 LAB — CMP (CANCER CENTER ONLY)
ALT: 19 U/L (ref 0–44)
AST: 20 U/L (ref 15–41)
Albumin: 3.9 g/dL (ref 3.5–5.0)
Alkaline Phosphatase: 47 U/L (ref 38–126)
Anion gap: 5 (ref 5–15)
BUN: 26 mg/dL — ABNORMAL HIGH (ref 8–23)
CO2: 31 mmol/L (ref 22–32)
Calcium: 10.2 mg/dL (ref 8.9–10.3)
Chloride: 105 mmol/L (ref 98–111)
Creatinine: 1.14 mg/dL — ABNORMAL HIGH (ref 0.44–1.00)
GFR, Estimated: 49 mL/min — ABNORMAL LOW (ref >60)
Glucose, Bld: 84 mg/dL (ref 70–99)
Potassium: 5.1 mmol/L (ref 3.5–5.1)
Sodium: 141 mmol/L (ref 135–145)
Total Bilirubin: 0.6 mg/dL (ref 0.3–1.2)
Total Protein: 6.5 g/dL (ref 6.5–8.1)

## 2021-07-15 LAB — CBC WITH DIFFERENTIAL (CANCER CENTER ONLY)
Abs Immature Granulocytes: 0.11 10*3/uL — ABNORMAL HIGH (ref 0.00–0.07)
Basophils Absolute: 0.1 10*3/uL (ref 0.0–0.1)
Basophils Relative: 1 %
Eosinophils Absolute: 0.2 10*3/uL (ref 0.0–0.5)
Eosinophils Relative: 2 %
HCT: 44.4 % (ref 36.0–46.0)
Hemoglobin: 14.7 g/dL (ref 12.0–15.0)
Immature Granulocytes: 2 %
Lymphocytes Relative: 42 %
Lymphs Abs: 2.6 10*3/uL (ref 0.7–4.0)
MCH: 32.6 pg (ref 26.0–34.0)
MCHC: 33.1 g/dL (ref 30.0–36.0)
MCV: 98.4 fL (ref 80.0–100.0)
Monocytes Absolute: 0.7 10*3/uL (ref 0.1–1.0)
Monocytes Relative: 12 %
Neutro Abs: 2.5 10*3/uL (ref 1.7–7.7)
Neutrophils Relative %: 41 %
Platelet Count: 80 10*3/uL — ABNORMAL LOW (ref 150–400)
RBC: 4.51 MIL/uL (ref 3.87–5.11)
RDW: 14.6 % (ref 11.5–15.5)
WBC Count: 6.1 10*3/uL (ref 4.0–10.5)
nRBC: 0 % (ref 0.0–0.2)

## 2021-07-15 LAB — LACTATE DEHYDROGENASE: LDH: 279 U/L — ABNORMAL HIGH (ref 98–192)

## 2021-07-15 MED ORDER — SODIUM CHLORIDE 0.9% FLUSH
10.0000 mL | Freq: Once | INTRAVENOUS | Status: AC
  Administered 2021-07-15: 10 mL via INTRAVENOUS

## 2021-07-15 MED ORDER — ANTICOAGULANT SODIUM CITRATE LOCK FLUSH 4% (120 MG/3ML) IV SOLN
2.1000 mL | PREFILLED_SYRINGE | Freq: Once | INTRAVENOUS | Status: AC
  Administered 2021-07-15: 2.1 mL
  Filled 2021-07-15: qty 3

## 2021-07-15 NOTE — Progress Notes (Signed)
I spoke with Ms. Urschel while she was in the infusion area for pheresis catheter care.  She understands the platelet count is lower.  She will continue prednisone 40 mg daily.  Dr. Benay Spice recommends Rituxan weekly x4 treatments.  She has had Rituxan in the past.  We reviewed potential side effects.  She agrees to proceed.  She will return for Rituxan in 1 week. ?

## 2021-07-19 ENCOUNTER — Other Ambulatory Visit: Payer: Self-pay

## 2021-07-19 ENCOUNTER — Telehealth: Payer: Self-pay

## 2021-07-19 ENCOUNTER — Inpatient Hospital Stay: Payer: Medicare Other

## 2021-07-19 ENCOUNTER — Other Ambulatory Visit: Payer: Self-pay | Admitting: Nurse Practitioner

## 2021-07-19 DIAGNOSIS — M3119 Other thrombotic microangiopathy: Secondary | ICD-10-CM

## 2021-07-19 DIAGNOSIS — Z5112 Encounter for antineoplastic immunotherapy: Secondary | ICD-10-CM | POA: Diagnosis not present

## 2021-07-19 LAB — COMPREHENSIVE METABOLIC PANEL
ALT: 24 U/L (ref 0–44)
AST: 30 U/L (ref 15–41)
Albumin: 3.7 g/dL (ref 3.5–5.0)
Alkaline Phosphatase: 46 U/L (ref 38–126)
Anion gap: 10 (ref 5–15)
BUN: 27 mg/dL — ABNORMAL HIGH (ref 8–23)
CO2: 26 mmol/L (ref 22–32)
Calcium: 9.5 mg/dL (ref 8.9–10.3)
Chloride: 103 mmol/L (ref 98–111)
Creatinine, Ser: 1.25 mg/dL — ABNORMAL HIGH (ref 0.44–1.00)
GFR, Estimated: 44 mL/min — ABNORMAL LOW (ref >60)
Glucose, Bld: 96 mg/dL (ref 70–99)
Potassium: 4.5 mmol/L (ref 3.5–5.1)
Sodium: 139 mmol/L (ref 135–145)
Total Bilirubin: 0.6 mg/dL (ref 0.3–1.2)
Total Protein: 6.6 g/dL (ref 6.5–8.1)

## 2021-07-19 LAB — CBC WITH DIFFERENTIAL/PLATELET
Abs Immature Granulocytes: 0.13 K/uL — ABNORMAL HIGH (ref 0.00–0.07)
Basophils Absolute: 0.1 K/uL (ref 0.0–0.1)
Basophils Relative: 1 %
Eosinophils Absolute: 0.1 K/uL (ref 0.0–0.5)
Eosinophils Relative: 1 %
HCT: 44.2 % (ref 36.0–46.0)
Hemoglobin: 14.8 g/dL (ref 12.0–15.0)
Immature Granulocytes: 2 %
Lymphocytes Relative: 40 %
Lymphs Abs: 3.2 K/uL (ref 0.7–4.0)
MCH: 32.9 pg (ref 26.0–34.0)
MCHC: 33.5 g/dL (ref 30.0–36.0)
MCV: 98.2 fL (ref 80.0–100.0)
Monocytes Absolute: 0.8 K/uL (ref 0.1–1.0)
Monocytes Relative: 11 %
Neutro Abs: 3.6 K/uL (ref 1.7–7.7)
Neutrophils Relative %: 45 %
Platelets: 77 K/uL — ABNORMAL LOW (ref 150–400)
RBC: 4.5 MIL/uL (ref 3.87–5.11)
RDW: 14.3 % (ref 11.5–15.5)
WBC: 7.9 K/uL (ref 4.0–10.5)
nRBC: 0 % (ref 0.0–0.2)

## 2021-07-19 LAB — LACTATE DEHYDROGENASE: LDH: 270 U/L — ABNORMAL HIGH (ref 98–192)

## 2021-07-19 NOTE — Telephone Encounter (Signed)
-----   Message from [name], NP sent at 07/19/2021  9:14 AM EDT ----- ?Please call patient-Dr. Benay Spice would like for her to have labs today.  ? ?

## 2021-07-19 NOTE — Telephone Encounter (Signed)
Patient request to have lab draw at [facility]. She is schedule for 2:15 today. Called to let the patient know the time. Patient gave verbal understanding and denied further questions. ?

## 2021-07-20 LAB — ADAMTS13 ANTIBODY: ADAMTS13 Antibody: 53 Units/mL — ABNORMAL HIGH (ref <12)

## 2021-07-20 LAB — ADAMTS13 ACTIVITY: Adamts 13 Activity: <2 % — CL (ref >66.8)

## 2021-07-23 ENCOUNTER — Other Ambulatory Visit: Payer: Self-pay

## 2021-07-23 ENCOUNTER — Inpatient Hospital Stay: Payer: Medicare Other

## 2021-07-23 ENCOUNTER — Ambulatory Visit: Payer: Medicare Other

## 2021-07-23 ENCOUNTER — Other Ambulatory Visit: Payer: Medicare Other

## 2021-07-23 ENCOUNTER — Other Ambulatory Visit: Payer: Self-pay | Admitting: *Deleted

## 2021-07-23 VITALS — BP 139/65 | HR 80 | Temp 97.7°F | Resp 18 | Wt 169.0 lb

## 2021-07-23 DIAGNOSIS — Z862 Personal history of diseases of the blood and blood-forming organs and certain disorders involving the immune mechanism: Secondary | ICD-10-CM

## 2021-07-23 DIAGNOSIS — Z452 Encounter for adjustment and management of vascular access device: Secondary | ICD-10-CM

## 2021-07-23 DIAGNOSIS — Z5112 Encounter for antineoplastic immunotherapy: Secondary | ICD-10-CM | POA: Diagnosis not present

## 2021-07-23 DIAGNOSIS — M3119 Other thrombotic microangiopathy: Secondary | ICD-10-CM

## 2021-07-23 LAB — CMP (CANCER CENTER ONLY)
ALT: 18 U/L (ref 0–44)
AST: 21 U/L (ref 15–41)
Albumin: 3.9 g/dL (ref 3.5–5.0)
Alkaline Phosphatase: 52 U/L (ref 38–126)
Anion gap: 8 (ref 5–15)
BUN: 34 mg/dL — ABNORMAL HIGH (ref 8–23)
CO2: 26 mmol/L (ref 22–32)
Calcium: 9.4 mg/dL (ref 8.9–10.3)
Chloride: 105 mmol/L (ref 98–111)
Creatinine: 1.25 mg/dL — ABNORMAL HIGH (ref 0.44–1.00)
GFR, Estimated: 44 mL/min — ABNORMAL LOW (ref >60)
Glucose, Bld: 78 mg/dL (ref 70–99)
Potassium: 3.8 mmol/L (ref 3.5–5.1)
Sodium: 139 mmol/L (ref 135–145)
Total Bilirubin: 0.6 mg/dL (ref 0.3–1.2)
Total Protein: 6.4 g/dL — ABNORMAL LOW (ref 6.5–8.1)

## 2021-07-23 LAB — CBC WITH DIFFERENTIAL (CANCER CENTER ONLY)
Abs Immature Granulocytes: 0.32 10*3/uL — ABNORMAL HIGH (ref 0.00–0.07)
Basophils Absolute: 0.1 10*3/uL (ref 0.0–0.1)
Basophils Relative: 1 %
Eosinophils Absolute: 0 10*3/uL (ref 0.0–0.5)
Eosinophils Relative: 0 %
HCT: 42.8 % (ref 36.0–46.0)
Hemoglobin: 14.3 g/dL (ref 12.0–15.0)
Immature Granulocytes: 4 %
Lymphocytes Relative: 31 %
Lymphs Abs: 2.7 10*3/uL (ref 0.7–4.0)
MCH: 32.4 pg (ref 26.0–34.0)
MCHC: 33.4 g/dL (ref 30.0–36.0)
MCV: 96.8 fL (ref 80.0–100.0)
Monocytes Absolute: 0.9 10*3/uL (ref 0.1–1.0)
Monocytes Relative: 10 %
Neutro Abs: 4.6 10*3/uL (ref 1.7–7.7)
Neutrophils Relative %: 54 %
Platelet Count: 75 10*3/uL — ABNORMAL LOW (ref 150–400)
RBC: 4.42 MIL/uL (ref 3.87–5.11)
RDW: 14.3 % (ref 11.5–15.5)
WBC Count: 8.5 10*3/uL (ref 4.0–10.5)
nRBC: 0.2 % (ref 0.0–0.2)

## 2021-07-23 LAB — LACTATE DEHYDROGENASE: LDH: 347 U/L — ABNORMAL HIGH (ref 98–192)

## 2021-07-23 MED ORDER — ACETAMINOPHEN 325 MG PO TABS
650.0000 mg | ORAL_TABLET | Freq: Once | ORAL | Status: AC
  Administered 2021-07-23: 650 mg via ORAL
  Filled 2021-07-23: qty 2

## 2021-07-23 MED ORDER — SODIUM CHLORIDE 0.9 % IV SOLN: Freq: Once | INTRAVENOUS | Status: AC

## 2021-07-23 MED ORDER — DIPHENHYDRAMINE HCL 25 MG PO CAPS
50.0000 mg | ORAL_CAPSULE | Freq: Once | ORAL | Status: AC
  Administered 2021-07-23: 50 mg via ORAL
  Filled 2021-07-23: qty 2

## 2021-07-23 MED ORDER — SODIUM CHLORIDE 0.9 % IV SOLN
375.0000 mg/m2 | Freq: Once | INTRAVENOUS | Status: AC
  Administered 2021-07-23: 700 mg via INTRAVENOUS
  Filled 2021-07-23: qty 50

## 2021-07-23 MED ORDER — ANTICOAGULANT SODIUM CITRATE LOCK FLUSH 4% (120 MG/3ML) IV SOLN
3.0000 mL | PREFILLED_SYRINGE | Freq: Once | INTRAVENOUS | Status: AC
  Administered 2021-07-23: 3 mL
  Filled 2021-07-23: qty 3

## 2021-07-23 NOTE — Patient Instructions (Signed)
[facility]  Discharge Instructions: ?Thank you for choosing [facility] to provide your oncology and hematology care.  ? ?If you have a lab appointment with the [facility], please go directly to the [facility] and check in at the registration area. ?  ?Wear comfortable clothing and clothing appropriate for easy access to any Portacath or PICC line.  ? ?We strive to give you quality time with your provider. You may need to reschedule your appointment if you arrive late (15 or more minutes).  Arriving late affects you and other patients whose appointments are after yours.  Also, if you miss three or more appointments without notifying the office, you may be dismissed from the clinic at the provider?s discretion.    ?  ?For prescription refill requests, have your pharmacy contact our office and allow 72 hours for refills to be completed.   ? ?Today you received the following chemotherapy and/or immunotherapy agents Rituxan    ?  ?To help prevent nausea and vomiting after your treatment, we encourage you to take your nausea medication as directed. ? ?BELOW ARE SYMPTOMS THAT SHOULD BE REPORTED IMMEDIATELY: ?*FEVER GREATER THAN 100.4 F (38 ?C) OR HIGHER ?*CHILLS OR SWEATING ?*NAUSEA AND VOMITING THAT IS NOT CONTROLLED WITH YOUR NAUSEA MEDICATION ?*UNUSUAL SHORTNESS OF BREATH ?*UNUSUAL BRUISING OR BLEEDING ?*URINARY PROBLEMS (pain or burning when urinating, or frequent urination) ?*BOWEL PROBLEMS (unusual diarrhea, constipation, pain near the anus) ?TENDERNESS IN MOUTH AND THROAT WITH OR WITHOUT PRESENCE OF ULCERS (sore throat, sores in mouth, or a toothache) ?UNUSUAL RASH, SWELLING OR PAIN  ?UNUSUAL VAGINAL DISCHARGE OR ITCHING  ? ?Items with * indicate a potential emergency and should be followed up as soon as possible or go to the Emergency Department if any problems should occur. ? ?Please show the CHEMOTHERAPY ALERT CARD or IMMUNOTHERAPY ALERT CARD at check-in to the  Emergency Department and triage nurse. ? ?Should you have questions after your visit or need to cancel or reschedule your appointment, please contact [facility]  Dept: [phone]  and follow the prompts.  Office hours are 8:00 a.m. to 4:30 p.m. Monday - Friday. Please note that voicemails left after 4:00 p.m. may not be returned until the following business day.  We are closed weekends and major holidays. You have access to a nurse at all times for urgent questions. Please call the main number to the clinic Dept: [phone] and follow the prompts. ? ? ?For any non-urgent questions, you may also contact your provider using MyChart. We now offer e-Visits for anyone 62 and older to request care online for non-urgent symptoms. For details visit [email or web address]. ?  ?Also download the MyChart app! Go to the app store, search "MyChart", open the app, select [facility], and log in with your MyChart username and password. ? ?Due to Covid, a mask is required upon entering the hospital/clinic. If you do not have a mask, one will be given to you upon arrival. For doctor visits, patients may have 1 support person aged 72 or older with them. For treatment visits, patients cannot have anyone with them due to current Covid guidelines and our immunocompromised population.  ? ?

## 2021-07-23 NOTE — Patient Instructions (Signed)
Rituximab Injection ?What is this medication? ?RITUXIMAB (ri TUX i mab) is a monoclonal antibody. It is used to treat certain types of cancer like non-Hodgkin lymphoma and chronic lymphocytic leukemia. It is also used to treat rheumatoid arthritis, granulomatosis with polyangiitis, microscopic polyangiitis, and pemphigus vulgaris. ?This medicine may be used for other purposes; ask your health care provider or pharmacist if you have questions. ?COMMON BRAND NAME(S): RIABNI, Rituxan, RUXIENCE ?What should I tell my care team before I take this medication? ?They need to know if you have any of these conditions: ?chest pain ?heart disease ?infection especially a viral infection such as chickenpox, cold sores, hepatitis B, or herpes ?immune system problems ?irregular heartbeat or rhythm ?kidney disease ?low blood counts (white cells, platelets, or red cells) ?lung disease ?recent or upcoming vaccine ?an unusual or allergic reaction to rituximab, other medicines, foods, dyes, or preservatives ?pregnant or trying to get pregnant ?breast-feeding ?How should I use this medication? ?This medicine is injected into a vein. It is given by a health care provider in a hospital or clinic setting. ?A special MedGuide will be given to you before each treatment. Be sure to read this information carefully each time. ?Talk to your health care provider about the use of this medicine in children. While this drug may be prescribed for children as young as 6 months for selected conditions, precautions do apply. ?Overdosage: If you think you have taken too much of this medicine contact a poison control center or emergency room at once. ?NOTE: This medicine is only for you. Do not share this medicine with others. ?What if I miss a dose? ?Keep appointments for follow-up doses. It is important not to miss your dose. Call your health care provider if you are unable to keep an appointment. ?What may interact with this medication? ?Do not take  this medicine with any of the following medicines: ?live vaccines ?This medicine may also interact with the following medicines: ?cisplatin ?This list may not describe all possible interactions. Give your health care provider a list of all the medicines, herbs, non-prescription drugs, or dietary supplements you use. Also tell them if you smoke, drink alcohol, or use illegal drugs. Some items may interact with your medicine. ?What should I watch for while using this medication? ?Your condition will be monitored carefully while you are receiving this medicine. You may need blood work done while you are taking this medicine. ?This medicine can cause serious infusion reactions. To reduce the risk your health care provider may give you other medicines to take before receiving this one. Be sure to follow the directions from your health care provider. ?This medicine may increase your risk of getting an infection. Call your health care provider for advice if you get a fever, chills, sore throat, or other symptoms of a cold or flu. Do not treat yourself. Try to avoid being around people who are sick. ?Call your health care provider if you are around anyone with measles, chickenpox, or if you develop sores or blisters that do not heal properly. ?Avoid taking medicines that contain aspirin, acetaminophen, ibuprofen, naproxen, or ketoprofen unless instructed by your health care provider. These medicines may hide a fever. ?This medicine may cause serious skin reactions. They can happen weeks to months after starting the medicine. Contact your health care provider right away if you notice fevers or flu-like symptoms with a rash. The rash may be red or purple and then turn into blisters or peeling of the skin. Or, you might  notice a red rash with swelling of the face, lips or lymph nodes in your neck or under your arms. ?In some patients, this medicine may cause a serious brain infection that may cause death. If you have any  problems seeing, thinking, speaking, walking, or standing, tell your healthcare professional right away. If you cannot reach your healthcare professional, urgently seek other source of medical care. ?Do not become pregnant while taking this medicine or for at least 12 months after stopping it. Women should inform their health care provider if they wish to become pregnant or think they might be pregnant. There is potential for serious harm to an unborn child. Talk to your health care provider for more information. Women should use a reliable form of birth control while taking this medicine and for 12 months after stopping it. Do not breast-feed while taking this medicine or for at least 6 months after stopping it. ?What side effects may I notice from receiving this medication? ?Side effects that you should report to your health care provider as soon as possible: ?allergic reactions (skin rash, itching or hives; swelling of the face, lips, or tongue) ?diarrhea ?edema (sudden weight gain; swelling of the ankles, feet, hands or other unusual swelling; trouble breathing) ?fast, irregular heartbeat ?heart attack (trouble breathing; pain or tightness in the chest, neck, back or arms; unusually weak or tired) ?infection (fever, chills, cough, sore throat, pain or trouble passing urine) ?kidney injury (trouble passing urine or change in the amount of urine) ?liver injury (dark yellow or brown urine; general ill feeling or flu-like symptoms; loss of appetite, right upper belly pain; unusually weak or tired, yellowing of the eyes or skin) ?low blood pressure (dizziness; feeling faint or lightheaded, falls; unusually weak or tired) ?low red blood cell counts (trouble breathing; feeling faint; lightheaded, falls; unusually weak or tired) ?mouth sores ?redness, blistering, peeling, or loosening of the skin, including inside the mouth ?stomach pain ?unusual bruising or bleeding ?wheezing (trouble breathing with loud or whistling  sounds) ?vomiting ?Side effects that usually do not require medical attention (report to your health care provider if they continue or are bothersome): ?headache ?joint pain ?muscle cramps, pain ?nausea ?This list may not describe all possible side effects. Call your doctor for medical advice about side effects. You may report side effects to FDA at 1-800-FDA-1088. ?Where should I keep my medication? ?This medicine is given in a hospital or clinic. It will not be stored at home. ?NOTE: This sheet is a summary. It may not cover all possible information. If you have questions about this medicine, talk to your doctor, pharmacist, or health care provider. ?? 2022 [employer]vier/Gold Standard (2020-04-20 00:00:00) ? ?

## 2021-07-23 NOTE — Progress Notes (Signed)
Patient presents for treatment. RN assessment completed along with the following: ? ?Labs/vitals reviewed - Yes, and within treatment parameters.   ?Weight within 10% of previous measurement - Yes ?Informed consent completed and reflects current therapy/intent - Yes, on date 07/05/13             ?Provider progress note reviewed - Patient not seen by provider today. Most recent note dated 07/08/21 reviewed. ?Treatment/Antibody/Supportive plan reviewed - Yes, and there are no adjustments needed for today's treatment. ?S&H and other orders reviewed - Yes, and there are no additional orders identified. ?Previous treatment date reviewed - Yes, and the appropriate amount of time has elapsed between treatments. ? ?Patient to proceed with treatment.   ?

## 2021-07-25 ENCOUNTER — Other Ambulatory Visit: Payer: Self-pay | Admitting: Oncology

## 2021-07-26 ENCOUNTER — Encounter: Payer: Self-pay | Admitting: Oncology

## 2021-07-27 ENCOUNTER — Telehealth: Payer: Self-pay

## 2021-07-27 ENCOUNTER — Inpatient Hospital Stay: Payer: Medicare Other

## 2021-07-27 ENCOUNTER — Other Ambulatory Visit: Payer: Self-pay

## 2021-07-27 ENCOUNTER — Telehealth: Payer: Self-pay | Admitting: Oncology

## 2021-07-27 ENCOUNTER — Inpatient Hospital Stay (HOSPITAL_BASED_OUTPATIENT_CLINIC_OR_DEPARTMENT_OTHER): Payer: Medicare Other | Admitting: Oncology

## 2021-07-27 VITALS — BP 149/64 | HR 60 | Temp 97.8°F | Resp 18 | Ht 64.0 in | Wt 177.0 lb

## 2021-07-27 DIAGNOSIS — M3119 Other thrombotic microangiopathy: Secondary | ICD-10-CM

## 2021-07-27 DIAGNOSIS — Z5112 Encounter for antineoplastic immunotherapy: Secondary | ICD-10-CM | POA: Diagnosis not present

## 2021-07-27 DIAGNOSIS — Z95828 Presence of other vascular implants and grafts: Secondary | ICD-10-CM

## 2021-07-27 LAB — CBC WITH DIFFERENTIAL (CANCER CENTER ONLY)
Abs Immature Granulocytes: 0.36 10*3/uL — ABNORMAL HIGH (ref 0.00–0.07)
Basophils Absolute: 0.1 10*3/uL (ref 0.0–0.1)
Basophils Relative: 1 %
Eosinophils Absolute: 0 10*3/uL (ref 0.0–0.5)
Eosinophils Relative: 0 %
HCT: 43.3 % (ref 36.0–46.0)
Hemoglobin: 14.3 g/dL (ref 12.0–15.0)
Immature Granulocytes: 4 %
Lymphocytes Relative: 28 %
Lymphs Abs: 2.8 10*3/uL (ref 0.7–4.0)
MCH: 32.3 pg (ref 26.0–34.0)
MCHC: 33 g/dL (ref 30.0–36.0)
MCV: 97.7 fL (ref 80.0–100.0)
Monocytes Absolute: 0.9 10*3/uL (ref 0.1–1.0)
Monocytes Relative: 9 %
Neutro Abs: 5.8 10*3/uL (ref 1.7–7.7)
Neutrophils Relative %: 58 %
Platelet Count: 53 10*3/uL — ABNORMAL LOW (ref 150–400)
RBC: 4.43 MIL/uL (ref 3.87–5.11)
RDW: 14.4 % (ref 11.5–15.5)
WBC Count: 10 10*3/uL (ref 4.0–10.5)
nRBC: 0.2 % (ref 0.0–0.2)

## 2021-07-27 LAB — CMP (CANCER CENTER ONLY)
ALT: 29 U/L (ref 0–44)
AST: 33 U/L (ref 15–41)
Albumin: 3.7 g/dL (ref 3.5–5.0)
Alkaline Phosphatase: 44 U/L (ref 38–126)
Anion gap: 21 — ABNORMAL HIGH (ref 5–15)
BUN: 30 mg/dL — ABNORMAL HIGH (ref 8–23)
CO2: 26 mmol/L (ref 22–32)
Calcium: 9.1 mg/dL (ref 8.9–10.3)
Chloride: 100 mmol/L (ref 98–111)
Creatinine: 1.19 mg/dL — ABNORMAL HIGH (ref 0.44–1.00)
GFR, Estimated: 47 mL/min — ABNORMAL LOW (ref >60)
Glucose, Bld: 106 mg/dL — ABNORMAL HIGH (ref 70–99)
Potassium: 4 mmol/L (ref 3.5–5.1)
Sodium: 147 mmol/L — ABNORMAL HIGH (ref 135–145)
Total Bilirubin: 0.6 mg/dL (ref 0.3–1.2)
Total Protein: 5.8 g/dL — ABNORMAL LOW (ref 6.5–8.1)

## 2021-07-27 LAB — LACTATE DEHYDROGENASE: LDH: 404 U/L — ABNORMAL HIGH (ref 98–192)

## 2021-07-27 MED ORDER — ANTICOAGULANT SODIUM CITRATE LOCK FLUSH 4% (120 MG/3ML) IV SOLN
2.1000 mL | PREFILLED_SYRINGE | Freq: Once | INTRAVENOUS | Status: AC
  Administered 2021-07-27: 2.1 mL
  Filled 2021-07-27: qty 3

## 2021-07-27 MED ORDER — SODIUM CHLORIDE 0.9% FLUSH
10.0000 mL | Freq: Once | INTRAVENOUS | Status: AC
  Administered 2021-07-27: 10 mL via INTRAVENOUS

## 2021-07-27 NOTE — Telephone Encounter (Signed)
Attempted to contact patient to advised about appt change from 3/31 to 3/30.  no answer so voicemail was left and also advised that change would be posted on Mychart. ?

## 2021-07-27 NOTE — Telephone Encounter (Signed)
Notified Charge Nurse  Destiny at [facility] that Pt will need plasmapheresis Wednesday, Friday, Monday Destiny informed me to tell the Pt to there at 11 am. VM message left on both house and cell phone to inform Pt and her husband about appointment times. ?

## 2021-07-27 NOTE — Progress Notes (Signed)
?[facility] ?OFFICE PROGRESS NOTE ? ? ?Diagnosis: TTP ? ?INTERVAL HISTORY:  ? ? [name] returns as scheduled.  She began rituximab 07/23/2021.  She tolerated rituximab well.  No fever, bleeding, or neurologic symptoms.  She is taking prednisone at a dose of 60 mg daily. ? ?Objective: ? ?Vital signs in last 24 hours: ? ?Blood pressure (!) 149/64, pulse 60, temperature 97.8 ?F (36.6 ?C), temperature source Oral, resp. rate 18, height '5\' 4"'$  (1.626 m), weight 177 lb (80.3 kg), SpO2 100 %. ?  ? ?HEENT: No thrush ?Resp: End inspiratory rhonchi at the posterior base bilaterally, no respiratory distress ?Cardio: Regular rate and rhythm ?GI: No hepatosplenomegaly ?Vascular: No leg edema ?Neuro: Alert and oriented ? ? ?Dialysis catheter without evidence of infection ? ?Lab Results: ? ?Lab Results  ?Component Value Date  ? WBC 10.0 07/27/2021  ? HGB 14.3 07/27/2021  ? HCT 43.3 07/27/2021  ? MCV 97.7 07/27/2021  ? PLT 53 (L) 07/27/2021  ? NEUTROABS 5.8 07/27/2021  ? ? ?CMP  ?Lab Results  ?Component Value Date  ? NA 147 (H) 07/27/2021  ? K 4.0 07/27/2021  ? CL 100 07/27/2021  ? CO2 26 07/27/2021  ? GLUCOSE 106 (H) 07/27/2021  ? BUN 30 (H) 07/27/2021  ? CREATININE 1.19 (H) 07/27/2021  ? CALCIUM 9.1 07/27/2021  ? PROT 5.8 (L) 07/27/2021  ? ALBUMIN 3.7 07/27/2021  ? AST 33 07/27/2021  ? ALT 29 07/27/2021  ? ALKPHOS 44 07/27/2021  ? BILITOT 0.6 07/27/2021  ? GFRNONAA 47 (L) 07/27/2021  ? GFRAA 50 (L) 11/25/2019  ? ? ?Medications: I have reviewed the patient's current medications. ? ? 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      -Steroids/FFP given 07/16/2020 ?       -Daily plasma exchange beginning 07/17/2020, 7 days, then qod starting 3/24, discharge 07/21/2020 on prednisone 60 mg daily ?      -Prednisone taper to 40 mg daily 07/29/2020 ?      -rituximab 07/30/2020, 08/06/2020, 08/13/2020, 08/20/2020 ?      -plasmapheresis on 07/31/2020, 08/03/2020, 08/05/2020, and 08/07/2020 ?      -prednisone increased to 60 mg daily 08/17/2020 ?      -Plasmapheresis 08/24/2020, 08/25/2020, 08/26/2020, 08/27/2020, 08/28/2020, 08/29/2020, 08/31/2020, 09/02/2020, 09/04/2020 ?      -caplacizumab initiated 08/24/2020 ?      -Decrease prednisone to 40 mg daily 08/28/2020 ?      -ADAMTS13 37% on 09/04/2020 ?      -Prednisone decreased to 30 mg daily 09/07/2020 ?      -Continue caplacizumab ?      -prednisone decreased to 20 mg daily beginning 09/18/2020 ?      -Cablivi discontinued 09/21/2020 ?      -prednisone discontinued 09/24/2020, resumed at a dose of 20 mg daily on 10/01/2020 ?      -prednisone taper to 10 mg daily 10/23/2020 ?      -Prednisone taper to 5 mg daily 11/06/2020 ?      -prednisone continued at a dose of 5 mg daily, Imuran resumed at 50 mg daily 12/03/2020 ?      - admission has 06/19/2021 with relapse of TTP, daily plasma exchange and Solu-Medrol beginning 06/20/2021, 1 dose  of caplacizumab 06/20/2021, discharged to home on 60 mg daily prednisone 06/26/2021, Monday, Wednesday, Friday plasmapheresis starting 06/28/2021; plasmapheresis 07/05/2021 and 07/07/2021, continue prednisone 40 mg daily ?     -Rituximab weekly for 4 weeks starting 07/23/2021 ?     -Outpatient plasmapheresis 07/28/2021, 07/30/2021, 08/02/2021 ?  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 Cristina Olson- no active bleeding or blood in the stomach, nor any  prospective bleeding site, seen on exam. 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 Jeral Olson on 10/07/2020; pelvic ultrasound 10/23/2020 with 5.1 cm diameter complex cyst left ovary containing a 12 mm mural nodule which lacks internal blood flow; per phone note 10/28/2020 Dr. Berline Olson recommends pelvic ultrasound at a 8-monthinterval ?CT 05/31/2021-5 mm left upper lobe pulmonary nodule not imaged on previous imaging studies.  Filling defect with enhancement in the left posterior lateral urinary bladder base near the left UVJ.  Lobulated left adnexal structure with low-attenuation but with intermediate density. ?  ?  ? ? 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  She is currently maintained on prednisone.  She began a course of rituximab last week.  The platelet count is lower today.  The LDH is higher.  Her clinical status appears unchanged.  She will continue prednisone.  The plan is to resume outpatient plasmapheresis tomorrow.  She will be scheduled for plasmapheresis on 07/28/2021, 07/30/2021, and 08/02/2021.  She will return for an office visit for 423.  She will continue weekly rituximab. ? ?Krystal Coder MD ? ?07/27/2021  ?12:48 PM ? ? ?

## 2021-07-28 ENCOUNTER — Non-Acute Institutional Stay (HOSPITAL_COMMUNITY)
Admission: AD | Admit: 2021-07-28 | Discharge: 2021-07-28 | Disposition: A | Discharge status: Home / Self Care | Payer: Medicare Other | Source: Ambulatory Visit | Attending: Oncology | Admitting: Oncology

## 2021-07-28 DIAGNOSIS — M3119 Other thrombotic microangiopathy: Secondary | ICD-10-CM | POA: Diagnosis present

## 2021-07-28 MED ORDER — SODIUM CHLORIDE 0.9 % IV SOLN
4.0000 g | Freq: Once | INTRAVENOUS | Status: AC
  Administered 2021-07-28: 4 g via INTRAVENOUS
  Filled 2021-07-28: qty 40

## 2021-07-28 MED ORDER — DIPHENHYDRAMINE HCL 25 MG PO CAPS: 25.0000 mg | ORAL_CAPSULE | Freq: Four times a day (QID) | ORAL | Status: DC | PRN

## 2021-07-28 MED ORDER — ANTICOAGULANT SODIUM CITRATE 4% (200MG/5ML) IV SOLN
5.0000 mL | Freq: Once | Status: DC
  Filled 2021-07-28 (×2): qty 5

## 2021-07-28 MED ORDER — ACD FORMULA A 0.73-2.45-2.2 GM/100ML VI SOLN
1000.0000 mL | Status: DC
  Administered 2021-07-28: 1000 mL

## 2021-07-28 MED ORDER — ACD FORMULA A 0.73-2.45-2.2 GM/100ML VI SOLN
Status: AC
  Filled 2021-07-28: qty 1000

## 2021-07-28 MED ORDER — CALCIUM CARBONATE ANTACID 500 MG PO CHEW
CHEWABLE_TABLET | ORAL | Status: AC
  Administered 2021-07-28: 200 mg
  Filled 2021-07-28: qty 2

## 2021-07-28 MED ORDER — ACETAMINOPHEN 325 MG PO TABS
650.0000 mg | ORAL_TABLET | ORAL | Status: DC | PRN
  Administered 2021-07-28: 650 mg via ORAL

## 2021-07-28 MED ORDER — ACETAMINOPHEN 325 MG PO TABS
ORAL_TABLET | ORAL | Status: AC
  Filled 2021-07-28: qty 2

## 2021-07-28 MED ORDER — DIPHENHYDRAMINE HCL 25 MG PO CAPS
ORAL_CAPSULE | ORAL | Status: AC
  Administered 2021-07-28: 25 mg via ORAL
  Filled 2021-07-28: qty 1

## 2021-07-28 NOTE — Discharge Instructions (Signed)
Follow up out patient physician ?

## 2021-07-28 NOTE — Progress Notes (Signed)
?   07/28/21 1523  ?Vitals  ?Temp 97.7 ?F (36.5 ?C)  ?Temp Source Temporal  ?Pulse Rate 94  ?ECG Heart Rate 93  ?Resp 17  ?BP (!) 152/69  ?Oxygen Therapy  ?SpO2 93 %  ?O2 Device Room Air  ?Pain Assessment  ?Pain Scale 0-10  ?Pain Score 0  ?Apheresis   ?Procedure Comments Tx completed  ?Post-apheresis  ?Net Removed (mL) 2181 mL  ?Replacement (mL) 1971 mL  ?ACDA infused (mL) 104 mL  ?Total Normal Saline Administered 210 mL  ?Total Calcium Administered 100 mL ?(4 gram)  ?Tolerated Procedure Yes  ?Post-Procedure Comments Tx completed without complication  ?Hemodialysis Catheter Left Internal jugular Double lumen Permanent (Tunneled)  ?Placement Date/Time: 06/25/21 1522   Time Out: Correct patient;Correct site;Correct procedure  Maximum sterile barrier precautions: Hand hygiene;Cap;Mask;Sterile gown;Sterile gloves;Large sterile sheet  Site Prep: Chlorhexidine (preferred);Skin Prep C...  ?Site Condition No complications  ?Blue Lumen Status Flushed;Other (Comment) ?(Locked with Sodium Citrate)  ?Red Lumen Status Flushed;Other (Comment) ?(Locked Sodium Citrate)  ?Catheter fill solution 4% Sodium Citrate  ?Catheter fill volume (Arterial) 2.1 cc  ?Catheter fill volume (Venous) 2.1  ?Dressing Type Tube stabilization device  ?Dressing Status Antimicrobial disc in place  ?Post treatment catheter status Capped and Clamped  ? ?Therapeutic Plasma Exchange completed without complication. Pt tolerated procedure well. Next anticipated tx on 07/30/2021.  ?

## 2021-07-29 ENCOUNTER — Inpatient Hospital Stay: Payer: Medicare Other

## 2021-07-29 VITALS — BP 123/60 | HR 86 | Temp 98.2°F | Resp 18

## 2021-07-29 DIAGNOSIS — M3119 Other thrombotic microangiopathy: Secondary | ICD-10-CM | POA: Diagnosis not present

## 2021-07-29 DIAGNOSIS — Z862 Personal history of diseases of the blood and blood-forming organs and certain disorders involving the immune mechanism: Secondary | ICD-10-CM

## 2021-07-29 DIAGNOSIS — Z5112 Encounter for antineoplastic immunotherapy: Secondary | ICD-10-CM | POA: Diagnosis not present

## 2021-07-29 DIAGNOSIS — Z95828 Presence of other vascular implants and grafts: Secondary | ICD-10-CM

## 2021-07-29 LAB — THERAPEUTIC PLASMA EXCHANGE (BLOOD BANK)
Plasma Exchange: 2202
Plasma volume needed: 2200
Unit division: 0
Unit division: 0
Unit division: 0
Unit division: 0
Unit division: 0
Unit division: 0

## 2021-07-29 LAB — CBC WITH DIFFERENTIAL (CANCER CENTER ONLY)
Abs Immature Granulocytes: 0.52 10*3/uL — ABNORMAL HIGH (ref 0.00–0.07)
Basophils Absolute: 0.1 10*3/uL (ref 0.0–0.1)
Basophils Relative: 1 %
Eosinophils Absolute: 0 10*3/uL (ref 0.0–0.5)
Eosinophils Relative: 0 %
HCT: 44.1 % (ref 36.0–46.0)
Hemoglobin: 14.9 g/dL (ref 12.0–15.0)
Immature Granulocytes: 4 %
Lymphocytes Relative: 19 %
Lymphs Abs: 2.3 10*3/uL (ref 0.7–4.0)
MCH: 32.9 pg (ref 26.0–34.0)
MCHC: 33.8 g/dL (ref 30.0–36.0)
MCV: 97.4 fL (ref 80.0–100.0)
Monocytes Absolute: 1.1 10*3/uL — ABNORMAL HIGH (ref 0.1–1.0)
Monocytes Relative: 9 %
Neutro Abs: 8.4 10*3/uL — ABNORMAL HIGH (ref 1.7–7.7)
Neutrophils Relative %: 67 %
Platelet Count: 49 10*3/uL — ABNORMAL LOW (ref 150–400)
RBC: 4.53 MIL/uL (ref 3.87–5.11)
RDW: 14.3 % (ref 11.5–15.5)
WBC Count: 12.5 10*3/uL — ABNORMAL HIGH (ref 4.0–10.5)
nRBC: 0.2 % (ref 0.0–0.2)

## 2021-07-29 LAB — CMP (CANCER CENTER ONLY)
ALT: 21 U/L (ref 0–44)
AST: 25 U/L (ref 15–41)
Albumin: 3.7 g/dL (ref 3.5–5.0)
Alkaline Phosphatase: 58 U/L (ref 38–126)
Anion gap: 11 (ref 5–15)
BUN: 41 mg/dL — ABNORMAL HIGH (ref 8–23)
CO2: 30 mmol/L (ref 22–32)
Calcium: 9.8 mg/dL (ref 8.9–10.3)
Chloride: 100 mmol/L (ref 98–111)
Creatinine: 1.31 mg/dL — ABNORMAL HIGH (ref 0.44–1.00)
GFR, Estimated: 41 mL/min — ABNORMAL LOW (ref >60)
Glucose, Bld: 76 mg/dL (ref 70–99)
Potassium: 4.3 mmol/L (ref 3.5–5.1)
Sodium: 141 mmol/L (ref 135–145)
Total Bilirubin: 0.6 mg/dL (ref 0.3–1.2)
Total Protein: 5.9 g/dL — ABNORMAL LOW (ref 6.5–8.1)

## 2021-07-29 LAB — LACTATE DEHYDROGENASE: LDH: 351 U/L — ABNORMAL HIGH (ref 98–192)

## 2021-07-29 MED ORDER — DIPHENHYDRAMINE HCL 25 MG PO CAPS
50.0000 mg | ORAL_CAPSULE | Freq: Once | ORAL | Status: AC
  Administered 2021-07-29: 50 mg via ORAL

## 2021-07-29 MED ORDER — ACETAMINOPHEN 325 MG PO TABS
650.0000 mg | ORAL_TABLET | Freq: Once | ORAL | Status: AC
  Administered 2021-07-29: 650 mg via ORAL

## 2021-07-29 MED ORDER — SODIUM CHLORIDE 0.9% FLUSH
10.0000 mL | Freq: Once | INTRAVENOUS | Status: AC
  Administered 2021-07-29: 10 mL via INTRAVENOUS

## 2021-07-29 MED ORDER — SODIUM CHLORIDE 0.9 % IV SOLN
375.0000 mg/m2 | Freq: Once | INTRAVENOUS | Status: AC
  Administered 2021-07-29: 700 mg via INTRAVENOUS
  Filled 2021-07-29: qty 20

## 2021-07-29 MED ORDER — SODIUM CHLORIDE 0.9 % IV SOLN: Freq: Once | INTRAVENOUS | Status: AC

## 2021-07-29 MED ORDER — ANTICOAGULANT SODIUM CITRATE LOCK FLUSH 4% (120 MG/3ML) IV SOLN
3.0000 mL | PREFILLED_SYRINGE | Freq: Once | INTRAVENOUS | Status: AC
  Administered 2021-07-29: 2.1 mL
  Filled 2021-07-29: qty 3

## 2021-07-29 NOTE — Progress Notes (Signed)
Patient presents for treatment. RN assessment completed along with the following: ? ?Labs/vitals reviewed - Yes, and within treatment parameters.   ?Weight within 10% of previous measurement - Yes ?Informed consent completed and reflects current therapy/intent - Yes, on date 07/05/13             ?Provider progress note reviewed - Patient not seen by provider today. Most recent note dated 07/27/21 reviewed. ?Treatment/Antibody/Supportive plan reviewed - Yes, and there are no adjustments needed for today's treatment. ?S&H and other orders reviewed - Yes, and there are no additional orders identified. ?Previous treatment date reviewed - Yes, and the appropriate amount of time has elapsed between treatments. ?Clinic Hand Off Received from - Cristy Friedlander, RN ? ?Patient to proceed with treatment.  ? ?

## 2021-07-29 NOTE — Progress Notes (Signed)
Pt discharged to home following PLEX treatment in stable condition.  No distress noted. ?

## 2021-07-29 NOTE — Patient Instructions (Signed)
[facility]  ? Discharge Instructions: ?Thank you for choosing [facility] to provide your oncology and hematology care.  ? ?If you have a lab appointment with the [facility], please go directly to the [facility] and check in at the registration area. ?  ?Wear comfortable clothing and clothing appropriate for easy access to any Portacath or PICC line.  ? ?We strive to give you quality time with your provider. You may need to reschedule your appointment if you arrive late (15 or more minutes).  Arriving late affects you and other patients whose appointments are after yours.  Also, if you miss three or more appointments without notifying the office, you may be dismissed from the clinic at the provider?s discretion.    ?  ?For prescription refill requests, have your pharmacy contact our office and allow 72 hours for refills to be completed.   ? ?Today you received the following chemotherapy and/or immunotherapy agents Rituximab    ?  ?To help prevent nausea and vomiting after your treatment, we encourage you to take your nausea medication as directed. ? ?BELOW ARE SYMPTOMS THAT SHOULD BE REPORTED IMMEDIATELY: ?*FEVER GREATER THAN 100.4 F (38 ?C) OR HIGHER ?*CHILLS OR SWEATING ?*NAUSEA AND VOMITING THAT IS NOT CONTROLLED WITH YOUR NAUSEA MEDICATION ?*UNUSUAL SHORTNESS OF BREATH ?*UNUSUAL BRUISING OR BLEEDING ?*URINARY PROBLEMS (pain or burning when urinating, or frequent urination) ?*BOWEL PROBLEMS (unusual diarrhea, constipation, pain near the anus) ?TENDERNESS IN MOUTH AND THROAT WITH OR WITHOUT PRESENCE OF ULCERS (sore throat, sores in mouth, or a toothache) ?UNUSUAL RASH, SWELLING OR PAIN  ?UNUSUAL VAGINAL DISCHARGE OR ITCHING  ? ?Items with * indicate a potential emergency and should be followed up as soon as possible or go to the Emergency Department if any problems should occur. ? ?Please show the CHEMOTHERAPY ALERT CARD or IMMUNOTHERAPY ALERT CARD at check-in to  the Emergency Department and triage nurse. ? ?Should you have questions after your visit or need to cancel or reschedule your appointment, please contact [facility]  Dept: [phone]  and follow the prompts.  Office hours are 8:00 a.m. to 4:30 p.m. Monday - Friday. Please note that voicemails left after 4:00 p.m. may not be returned until the following business day.  We are closed weekends and major holidays. You have access to a nurse at all times for urgent questions. Please call the main number to the clinic Dept: [phone] and follow the prompts. ? ? ?For any non-urgent questions, you may also contact your provider using MyChart. We now offer e-Visits for anyone 54 and older to request care online for non-urgent symptoms. For details visit [email or web address]. ?  ?Also download the MyChart app! Go to the app store, search "MyChart", open the app, select [facility], and log in with your MyChart username and password. ? ?Due to Covid, a mask is required upon entering the hospital/clinic. If you do not have a mask, one will be given to you upon arrival. For doctor visits, patients may have 1 support person aged 6 or older with them. For treatment visits, patients cannot have anyone with them due to current Covid guidelines and our immunocompromised population.  ? ?Rituximab Injection ?What is this medication? ?RITUXIMAB (ri TUX i mab) is a monoclonal antibody. It is used to treat certain types of cancer like non-Hodgkin lymphoma and chronic lymphocytic leukemia. It is also used to treat rheumatoid arthritis, granulomatosis with polyangiitis, microscopic polyangiitis, and pemphigus vulgaris. ?This  medicine may be used for other purposes; ask your health care provider or pharmacist if you have questions. ?COMMON BRAND NAME(S): RIABNI, Rituxan, RUXIENCE ?What should I tell my care team before I take this medication? ?They need to know if you have any of these conditions: ?chest  pain ?heart disease ?infection especially a viral infection such as chickenpox, cold sores, hepatitis B, or herpes ?immune system problems ?irregular heartbeat or rhythm ?kidney disease ?low blood counts (white cells, platelets, or red cells) ?lung disease ?recent or upcoming vaccine ?an unusual or allergic reaction to rituximab, other medicines, foods, dyes, or preservatives ?pregnant or trying to get pregnant ?breast-feeding ?How should I use this medication? ?This medicine is injected into a vein. It is given by a health care provider in a hospital or clinic setting. ?A special MedGuide will be given to you before each treatment. Be sure to read this information carefully each time. ?Talk to your health care provider about the use of this medicine in children. While this drug may be prescribed for children as young as 6 months for selected conditions, precautions do apply. ?Overdosage: If you think you have taken too much of this medicine contact a poison control center or emergency room at once. ?NOTE: This medicine is only for you. Do not share this medicine with others. ?What if I miss a dose? ?Keep appointments for follow-up doses. It is important not to miss your dose. Call your health care provider if you are unable to keep an appointment. ?What may interact with this medication? ?Do not take this medicine with any of the following medicines: ?live vaccines ?This medicine may also interact with the following medicines: ?cisplatin ?This list may not describe all possible interactions. Give your health care provider a list of all the medicines, herbs, non-prescription drugs, or dietary supplements you use. Also tell them if you smoke, drink alcohol, or use illegal drugs. Some items may interact with your medicine. ?What should I watch for while using this medication? ?Your condition will be monitored carefully while you are receiving this medicine. You may need blood work done while you are taking this  medicine. ?This medicine can cause serious infusion reactions. To reduce the risk your health care provider may give you other medicines to take before receiving this one. Be sure to follow the directions from your health care provider. ?This medicine may increase your risk of getting an infection. Call your health care provider for advice if you get a fever, chills, sore throat, or other symptoms of a cold or flu. Do not treat yourself. Try to avoid being around people who are sick. ?Call your health care provider if you are around anyone with measles, chickenpox, or if you develop sores or blisters that do not heal properly. ?Avoid taking medicines that contain aspirin, acetaminophen, ibuprofen, naproxen, or ketoprofen unless instructed by your health care provider. These medicines may hide a fever. ?This medicine may cause serious skin reactions. They can happen weeks to months after starting the medicine. Contact your health care provider right away if you notice fevers or flu-like symptoms with a rash. The rash may be red or purple and then turn into blisters or peeling of the skin. Or, you might notice a red rash with swelling of the face, lips or lymph nodes in your neck or under your arms. ?In some patients, this medicine may cause a serious brain infection that may cause death. If you have any problems seeing, thinking, speaking, walking, or standing, tell  your healthcare professional right away. If you cannot reach your healthcare professional, urgently seek other source of medical care. ?Do not become pregnant while taking this medicine or for at least 12 months after stopping it. Women should inform their health care provider if they wish to become pregnant or think they might be pregnant. There is potential for serious harm to an unborn child. Talk to your health care provider for more information. Women should use a reliable form of birth control while taking this medicine and for 12 months after  stopping it. Do not breast-feed while taking this medicine or for at least 6 months after stopping it. ?What side effects may I notice from receiving this medication? ?Side effects that you should report to your he

## 2021-07-30 ENCOUNTER — Other Ambulatory Visit: Payer: Medicare Other

## 2021-07-30 ENCOUNTER — Inpatient Hospital Stay: Payer: Medicare Other

## 2021-07-30 ENCOUNTER — Ambulatory Visit: Payer: Medicare Other

## 2021-08-01 ENCOUNTER — Other Ambulatory Visit: Payer: Self-pay | Admitting: Oncology

## 2021-08-02 ENCOUNTER — Telehealth: Payer: Self-pay

## 2021-08-02 ENCOUNTER — Non-Acute Institutional Stay (HOSPITAL_COMMUNITY)
Admission: AD | Admit: 2021-08-02 | Discharge: 2021-08-02 | Disposition: A | Discharge status: Home / Self Care | Payer: Medicare Other | Source: Ambulatory Visit | Attending: Oncology | Admitting: Oncology

## 2021-08-02 DIAGNOSIS — M3119 Other thrombotic microangiopathy: Secondary | ICD-10-CM | POA: Diagnosis present

## 2021-08-02 LAB — CBC WITH DIFFERENTIAL/PLATELET
Abs Immature Granulocytes: 0.56 10*3/uL — ABNORMAL HIGH (ref 0.00–0.07)
Basophils Absolute: 0.1 10*3/uL (ref 0.0–0.1)
Basophils Relative: 0 %
Eosinophils Absolute: 0 10*3/uL (ref 0.0–0.5)
Eosinophils Relative: 0 %
HCT: 42.3 % (ref 36.0–46.0)
Hemoglobin: 14.1 g/dL (ref 12.0–15.0)
Immature Granulocytes: 5 %
Lymphocytes Relative: 5 %
Lymphs Abs: 0.6 10*3/uL — ABNORMAL LOW (ref 0.7–4.0)
MCH: 33.2 pg (ref 26.0–34.0)
MCHC: 33.3 g/dL (ref 30.0–36.0)
MCV: 99.5 fL (ref 80.0–100.0)
Monocytes Absolute: 0.3 10*3/uL (ref 0.1–1.0)
Monocytes Relative: 2 %
Neutro Abs: 11 10*3/uL — ABNORMAL HIGH (ref 1.7–7.7)
Neutrophils Relative %: 88 %
Platelets: 32 10*3/uL — ABNORMAL LOW (ref 150–400)
RBC: 4.25 MIL/uL (ref 3.87–5.11)
RDW: 14.6 % (ref 11.5–15.5)
WBC: 12.5 10*3/uL — ABNORMAL HIGH (ref 4.0–10.5)
nRBC: 0.2 % (ref 0.0–0.2)

## 2021-08-02 LAB — BASIC METABOLIC PANEL
Anion gap: 12 (ref 5–15)
BUN: 32 mg/dL — ABNORMAL HIGH (ref 8–23)
CO2: 26 mmol/L (ref 22–32)
Calcium: 8.4 mg/dL — ABNORMAL LOW (ref 8.9–10.3)
Chloride: 101 mmol/L (ref 98–111)
Creatinine, Ser: 1.24 mg/dL — ABNORMAL HIGH (ref 0.44–1.00)
GFR, Estimated: 44 mL/min — ABNORMAL LOW (ref >60)
Glucose, Bld: 221 mg/dL — ABNORMAL HIGH (ref 70–99)
Potassium: 4.1 mmol/L (ref 3.5–5.1)
Sodium: 139 mmol/L (ref 135–145)

## 2021-08-02 LAB — LACTATE DEHYDROGENASE: LDH: 382 U/L — ABNORMAL HIGH (ref 98–192)

## 2021-08-02 MED ORDER — ACETAMINOPHEN 325 MG PO TABS
650.0000 mg | ORAL_TABLET | ORAL | Status: DC | PRN
  Administered 2021-08-02: 650 mg via ORAL
  Filled 2021-08-02: qty 2

## 2021-08-02 MED ORDER — ANTICOAGULANT SODIUM CITRATE 4% (200MG/5ML) IV SOLN
5.0000 mL | Freq: Once | Status: AC
  Administered 2021-08-02: 5 mL
  Filled 2021-08-02: qty 5

## 2021-08-02 MED ORDER — CALCIUM CARBONATE ANTACID 500 MG PO CHEW
CHEWABLE_TABLET | ORAL | Status: AC
  Filled 2021-08-02: qty 2

## 2021-08-02 MED ORDER — DIPHENHYDRAMINE HCL 25 MG PO CAPS
25.0000 mg | ORAL_CAPSULE | Freq: Four times a day (QID) | ORAL | Status: DC | PRN
  Administered 2021-08-02: 25 mg via ORAL
  Filled 2021-08-02: qty 1

## 2021-08-02 MED ORDER — SODIUM CHLORIDE 0.9 % IV SOLN
4.0000 g | Freq: Once | INTRAVENOUS | Status: AC
  Administered 2021-08-02: 4 g via INTRAVENOUS
  Filled 2021-08-02: qty 40

## 2021-08-02 MED ORDER — ACD FORMULA A 0.73-2.45-2.2 GM/100ML VI SOLN
1000.0000 mL | Status: DC
  Administered 2021-08-02: 1000 mL

## 2021-08-02 NOTE — Telephone Encounter (Signed)
Called Dialysis unit at [facility] and spoke with [name] to let his know she needs CBC w. Diff., Chemistry panel, and LDH ?

## 2021-08-02 NOTE — Progress Notes (Signed)
?   08/02/21 1540  ?Apheresis   ?Procedure Comments Tx completed without complication  ?Post-apheresis  ?Net Removed (mL) 2219 mL  ?Replacement (mL) 2043 mL  ?ACDA infused (mL) 63 mL  ?Total Normal Saline Administered 108 mL  ?Total Calcium Administered  ?(4 gram)  ?Tolerated Procedure Yes  ?Post-Procedure Comments vss  nad  ? ?Plasmapheresis tx completed without complications.  Vitals stable, pt discharged to home via wheelchair.  Pt left with physical copy of d/c  instructions containing next appointment date and time.  Next tentative scheduled tx on 08/06/2021 at 11am. ?

## 2021-08-02 NOTE — Telephone Encounter (Signed)
-----   Message from [name], NP sent at 08/02/2021  8:12 AM EDT ----- ?Please call the dialysis unit at [facility].  She needs labs today when she is there for pheresis-CBC with differential, chemistry panel, LDH. ? ?

## 2021-08-02 NOTE — Telephone Encounter (Signed)
Patient's daughter called the telephone advice record and states her mother and her got a very disturbing from a snippy nurse about her missing an infusion. However, they can't find any appts.  on my chart. I reach out to the daughter to let her know her mother is schedule for her Hemodialysis on Monday, Wednesday, and Friday. at 27. I also let the daughter know if she needs a print out of the schedule appt. she can get it today after her mother visit. Patient's daughter verbal understanding. ?

## 2021-08-03 ENCOUNTER — Telehealth: Payer: Self-pay

## 2021-08-03 ENCOUNTER — Inpatient Hospital Stay: Payer: Medicare Other

## 2021-08-03 ENCOUNTER — Inpatient Hospital Stay: Payer: Medicare Other | Attending: Oncology

## 2021-08-03 ENCOUNTER — Inpatient Hospital Stay (HOSPITAL_BASED_OUTPATIENT_CLINIC_OR_DEPARTMENT_OTHER): Payer: Medicare Other | Admitting: Nurse Practitioner

## 2021-08-03 ENCOUNTER — Encounter: Payer: Self-pay | Admitting: Nurse Practitioner

## 2021-08-03 ENCOUNTER — Other Ambulatory Visit: Payer: Self-pay | Admitting: Nurse Practitioner

## 2021-08-03 VITALS — BP 159/66 | HR 88 | Temp 97.8°F | Resp 18 | Ht 64.0 in | Wt 174.8 lb

## 2021-08-03 DIAGNOSIS — R600 Localized edema: Secondary | ICD-10-CM | POA: Insufficient documentation

## 2021-08-03 DIAGNOSIS — M3119 Other thrombotic microangiopathy: Secondary | ICD-10-CM

## 2021-08-03 DIAGNOSIS — Z5112 Encounter for antineoplastic immunotherapy: Secondary | ICD-10-CM | POA: Insufficient documentation

## 2021-08-03 DIAGNOSIS — H538 Other visual disturbances: Secondary | ICD-10-CM | POA: Diagnosis not present

## 2021-08-03 DIAGNOSIS — D649 Anemia, unspecified: Secondary | ICD-10-CM | POA: Diagnosis not present

## 2021-08-03 DIAGNOSIS — G8929 Other chronic pain: Secondary | ICD-10-CM | POA: Diagnosis not present

## 2021-08-03 DIAGNOSIS — Z79899 Other long term (current) drug therapy: Secondary | ICD-10-CM | POA: Diagnosis not present

## 2021-08-03 DIAGNOSIS — Z8673 Personal history of transient ischemic attack (TIA), and cerebral infarction without residual deficits: Secondary | ICD-10-CM | POA: Insufficient documentation

## 2021-08-03 DIAGNOSIS — C679 Malignant neoplasm of bladder, unspecified: Secondary | ICD-10-CM | POA: Insufficient documentation

## 2021-08-03 DIAGNOSIS — N289 Disorder of kidney and ureter, unspecified: Secondary | ICD-10-CM | POA: Insufficient documentation

## 2021-08-03 DIAGNOSIS — M549 Dorsalgia, unspecified: Secondary | ICD-10-CM | POA: Insufficient documentation

## 2021-08-03 DIAGNOSIS — J449 Chronic obstructive pulmonary disease, unspecified: Secondary | ICD-10-CM | POA: Insufficient documentation

## 2021-08-03 DIAGNOSIS — R58 Hemorrhage, not elsewhere classified: Secondary | ICD-10-CM | POA: Diagnosis not present

## 2021-08-03 LAB — CBC WITH DIFFERENTIAL (CANCER CENTER ONLY)
Abs Immature Granulocytes: 0.55 10*3/uL — ABNORMAL HIGH (ref 0.00–0.07)
Basophils Absolute: 0.1 10*3/uL (ref 0.0–0.1)
Basophils Relative: 1 %
Eosinophils Absolute: 0.1 10*3/uL (ref 0.0–0.5)
Eosinophils Relative: 0 %
HCT: 44.8 % (ref 36.0–46.0)
Hemoglobin: 14.6 g/dL (ref 12.0–15.0)
Immature Granulocytes: 4 %
Lymphocytes Relative: 11 %
Lymphs Abs: 1.6 10*3/uL (ref 0.7–4.0)
MCH: 32.4 pg (ref 26.0–34.0)
MCHC: 32.6 g/dL (ref 30.0–36.0)
MCV: 99.6 fL (ref 80.0–100.0)
Monocytes Absolute: 1 10*3/uL (ref 0.1–1.0)
Monocytes Relative: 7 %
Neutro Abs: 11.2 10*3/uL — ABNORMAL HIGH (ref 1.7–7.7)
Neutrophils Relative %: 77 %
Platelet Count: 39 10*3/uL — ABNORMAL LOW (ref 150–400)
RBC: 4.5 MIL/uL (ref 3.87–5.11)
RDW: 14.8 % (ref 11.5–15.5)
WBC Count: 14.5 10*3/uL — ABNORMAL HIGH (ref 4.0–10.5)
nRBC: 0.3 % — ABNORMAL HIGH (ref 0.0–0.2)

## 2021-08-03 LAB — CMP (CANCER CENTER ONLY)
ALT: 22 U/L (ref 0–44)
AST: 25 U/L (ref 15–41)
Albumin: 3.7 g/dL (ref 3.5–5.0)
Alkaline Phosphatase: 50 U/L (ref 38–126)
Anion gap: 9 (ref 5–15)
BUN: 35 mg/dL — ABNORMAL HIGH (ref 8–23)
CO2: 33 mmol/L — ABNORMAL HIGH (ref 22–32)
Calcium: 10.2 mg/dL (ref 8.9–10.3)
Chloride: 102 mmol/L (ref 98–111)
Creatinine: 1.51 mg/dL — ABNORMAL HIGH (ref 0.44–1.00)
GFR, Estimated: 35 mL/min — ABNORMAL LOW (ref >60)
Glucose, Bld: 96 mg/dL (ref 70–99)
Potassium: 5.2 mmol/L — ABNORMAL HIGH (ref 3.5–5.1)
Sodium: 144 mmol/L (ref 135–145)
Total Bilirubin: 0.8 mg/dL (ref 0.3–1.2)
Total Protein: 5.9 g/dL — ABNORMAL LOW (ref 6.5–8.1)

## 2021-08-03 LAB — THERAPEUTIC PLASMA EXCHANGE (BLOOD BANK)
Plasma Exchange: 2200
Plasma volume needed: 2200
Unit division: 0
Unit division: 0
Unit division: 0
Unit division: 0
Unit division: 0
Unit division: 0

## 2021-08-03 LAB — LACTATE DEHYDROGENASE: LDH: 344 U/L — ABNORMAL HIGH (ref 98–192)

## 2021-08-03 MED ORDER — ANTICOAGULANT SODIUM CITRATE LOCK FLUSH 4% (120 MG/3ML) IV SOLN
5.0000 mL | PREFILLED_SYRINGE | Freq: Once | INTRAVENOUS | Status: DC
  Filled 2021-08-03: qty 6

## 2021-08-03 MED ORDER — SODIUM CHLORIDE 0.9% FLUSH: 10.0000 mL | Freq: Once | INTRAVENOUS | Status: DC

## 2021-08-03 NOTE — Progress Notes (Signed)
?[facility] ?OFFICE PROGRESS NOTE ? ? ?Diagnosis: TTP ? ?INTERVAL HISTORY:  ? ?[name] returns as scheduled.  Outpatient pheresis on 07/28/2021, 08/02/2021.  She was not aware she had a pheresis appointment 07/30/2021.  Week 2 of 4 Rituxan 07/29/2021. ? ?She denies bleeding.  She feels a little more unsteady.  Her daughter attributes this to prednisone.  Current dose of prednisone confirmed to be 60 mg daily.  No new ecchymoses.  Daughter feels current bruises are healing. ? ?Objective: ? ?Vital signs in last 24 hours: ? ?Blood pressure (!) 159/66, pulse 88, temperature 97.8 ?F (36.6 ?C), temperature source Oral, resp. rate 18, height '5\' 4"'$  (1.626 m), weight 174 lb 12.8 oz (79.3 kg), SpO2 95 %. ?  ? ?HEENT: No thrush or ulcers.  No bleeding in the oral cavity. ?Resp: Distant breath sounds.  No respiratory distress. ?Cardio: Regular rate and rhythm. ?GI: No hepatosplenomegaly. ?Vascular: Trace lower leg edema bilaterally. ?Neuro: Alert and oriented.  Follows commands. ?Skin: Ecchymoses scattered over the upper extremities, various stages of resolution. ?Pheresis catheter with surrounding ecchymosis. ? ? ?Lab Results: ? ?Lab Results  ?Component Value Date  ? WBC 14.5 (H) 08/03/2021  ? HGB 14.6 08/03/2021  ? HCT 44.8 08/03/2021  ? MCV 99.6 08/03/2021  ? PLT 39 (L) 08/03/2021  ? NEUTROABS 11.2 (H) 08/03/2021  ? ? ?Imaging: ? ?No results found. ? ?Medications: I have reviewed the patient's current medications. ? 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      -Steroids/FFP given 07/16/2020 ?      -Daily plasma exchange beginning 07/17/2020, 7 days, then qod  starting 3/24, discharge 07/21/2020 on prednisone 60 mg daily ?      -Prednisone taper to 40 mg daily 07/29/2020 ?      -rituximab 07/30/2020, 08/06/2020, 08/13/2020, 08/20/2020 ?      -plasmapheresis on 07/31/2020, 08/03/2020, 08/05/2020, and 08/07/2020 ?      -prednisone increased to 60 mg daily 08/17/2020 ?      -Plasmapheresis 08/24/2020, 08/25/2020, 08/26/2020, 08/27/2020, 08/28/2020, 08/29/2020, 08/31/2020, 09/02/2020, 09/04/2020 ?      -caplacizumab initiated 08/24/2020 ?      -Decrease prednisone to 40 mg daily 08/28/2020 ?      -ADAMTS13 37% on 09/04/2020 ?      -Prednisone decreased to 30 mg daily 09/07/2020 ?      -Continue caplacizumab ?      -prednisone decreased to 20 mg daily beginning 09/18/2020 ?      -Cablivi discontinued 09/21/2020 ?      -prednisone discontinued 09/24/2020, resumed at a dose of 20 mg daily on 10/01/2020 ?      -prednisone taper to 10 mg daily 10/23/2020 ?      -Prednisone taper to 5 mg daily 11/06/2020 ?      -prednisone continued at a dose of 5 mg daily, Imuran resumed at 50 mg daily 12/03/2020 ?      - admission has 06/19/2021 with relapse of TTP, daily plasma exchange and Solu-Medrol beginning 06/20/2021, 1 dose of caplacizumab 06/20/2021, discharged to home on 60 mg daily prednisone 06/26/2021, Monday, Wednesday, Friday plasmapheresis starting 06/28/2021; plasmapheresis 07/05/2021 and 07/07/2021, continue prednisone 40 mg daily ?     -Rituximab weekly for  4 weeks starting 07/23/2021, 07/29/2021 ?     -Outpatient plasmapheresis 07/28/2021,08/02/2021 ?-Prednisone 60 mg daily ?  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 Cristina Gong- no active bleeding or blood in the stomach, nor any prospective bleeding site, seen on  exam. 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 Jeral Pinch on 10/07/2020; pelvic ultrasound 10/23/2020 with 5.1 cm diameter complex cyst left ovary containing a 12 mm mural nodule which lacks internal blood flow; per phone note 10/28/2020 Dr. Berline Lopes recommends pelvic ultrasound at a 79-monthinterval ?CT 05/31/2021-5 mm left upper lobe pulmonary nodule not imaged on previous imaging studies.  Filling defect with enhancement in the left posterior lateral urinary bladder base near the left UVJ.  Lobulated left adnexal structure with low-attenuation but with intermediate density. ?  ?  ? ?Disposition: Ms. WDeshotelappears unchanged.  Platelet count is stable to slightly improved.  Hemoglobin remains in normal range.  Plan to continue pheresis on a Monday Wednesday Friday schedule this week and next week.  She will continue prednisone 60 mg daily.  She will return for week 3 of 4 Rituxan on 08/05/2021.  We will see her in follow-up prior to the fourth weekly Rituxan on 08/12/2021.  She will contact the office in the interim with any problems. ? ?Patient seen with Dr. SBenay Spice ? ? ? ?LNed CardANP/GNP-BC  ? ?08/03/2021  ?1:36 PM ? ?This was a shared visit with LNed Card  [name] with her daughter.  The platelet count has climbed over the past few weeks while on prednisone.  She has completed 2 weeks of rituximab therapy.  She will complete a 4-week course of rituximab.  She continues prednisone.  She resumed outpatient plasmapheresis last week.  She will continue plasmapheresis on a Monday, Wednesday, and Friday schedule. ? ?We will check labs with the plasmapheresis procedures this week.  She will be scheduled for  an office visit with rituximab therapy next week. ? ?I was present for greater than 50% of today's visit.  I performed medical decision making. ? ?BJulieanne Manson MD ? ? ? ? ? ?

## 2021-08-03 NOTE — Telephone Encounter (Signed)
Called and spoke with Fu the charge nurse,  to make sure the patient is schedule for lab and pheresis on Mon, Wed, and Fri (April 5,7,10,12 and 14). Fu is aware on the schedule. Called the patient's daughter to informed her the schedule time. Patient's daughter gave verbal understanding. ?

## 2021-08-04 ENCOUNTER — Non-Acute Institutional Stay (HOSPITAL_COMMUNITY)
Admission: RE | Admit: 2021-08-04 | Discharge: 2021-08-04 | Disposition: A | Discharge status: Home / Self Care | Payer: Medicare Other | Source: Ambulatory Visit | Attending: Oncology | Admitting: Oncology

## 2021-08-04 ENCOUNTER — Other Ambulatory Visit: Payer: Self-pay

## 2021-08-04 DIAGNOSIS — M3119 Other thrombotic microangiopathy: Secondary | ICD-10-CM | POA: Diagnosis not present

## 2021-08-04 LAB — CBC WITH DIFFERENTIAL/PLATELET
Abs Immature Granulocytes: 0.53 10*3/uL — ABNORMAL HIGH (ref 0.00–0.07)
Basophils Absolute: 0.1 10*3/uL (ref 0.0–0.1)
Basophils Relative: 1 %
Eosinophils Absolute: 0 10*3/uL (ref 0.0–0.5)
Eosinophils Relative: 0 %
HCT: 42.2 % (ref 36.0–46.0)
Hemoglobin: 14.2 g/dL (ref 12.0–15.0)
Immature Granulocytes: 5 %
Lymphocytes Relative: 5 %
Lymphs Abs: 0.6 10*3/uL — ABNORMAL LOW (ref 0.7–4.0)
MCH: 33.6 pg (ref 26.0–34.0)
MCHC: 33.6 g/dL (ref 30.0–36.0)
MCV: 99.8 fL (ref 80.0–100.0)
Monocytes Absolute: 0.4 10*3/uL (ref 0.1–1.0)
Monocytes Relative: 4 %
Neutro Abs: 10 10*3/uL — ABNORMAL HIGH (ref 1.7–7.7)
Neutrophils Relative %: 85 %
Platelets: 34 10*3/uL — ABNORMAL LOW (ref 150–400)
RBC: 4.23 MIL/uL (ref 3.87–5.11)
RDW: 14.8 % (ref 11.5–15.5)
WBC: 11.6 10*3/uL — ABNORMAL HIGH (ref 4.0–10.5)
nRBC: 0.2 % (ref 0.0–0.2)

## 2021-08-04 LAB — BASIC METABOLIC PANEL
Anion gap: 10 (ref 5–15)
BUN: 36 mg/dL — ABNORMAL HIGH (ref 8–23)
CO2: 29 mmol/L (ref 22–32)
Calcium: 9.2 mg/dL (ref 8.9–10.3)
Chloride: 102 mmol/L (ref 98–111)
Creatinine, Ser: 1.28 mg/dL — ABNORMAL HIGH (ref 0.44–1.00)
GFR, Estimated: 43 mL/min — ABNORMAL LOW (ref >60)
Glucose, Bld: 131 mg/dL — ABNORMAL HIGH (ref 70–99)
Potassium: 3.8 mmol/L (ref 3.5–5.1)
Sodium: 141 mmol/L (ref 135–145)

## 2021-08-04 LAB — LACTATE DEHYDROGENASE: LDH: 204 U/L — ABNORMAL HIGH (ref 98–192)

## 2021-08-04 MED ORDER — DIPHENHYDRAMINE HCL 25 MG PO CAPS: 25.0000 mg | ORAL_CAPSULE | Freq: Four times a day (QID) | ORAL | Status: DC | PRN

## 2021-08-04 MED ORDER — CALCIUM GLUCONATE-NACL 2-0.675 GM/100ML-% IV SOLN
INTRAVENOUS | Status: AC
  Administered 2021-08-04: 2000 mg via INTRAVENOUS
  Filled 2021-08-04: qty 100

## 2021-08-04 MED ORDER — CALCIUM CARBONATE ANTACID 500 MG PO CHEW: 2.0000 | CHEWABLE_TABLET | ORAL | Status: DC

## 2021-08-04 MED ORDER — CALCIUM GLUCONATE-NACL 2-0.675 GM/100ML-% IV SOLN: 2.0000 g | Freq: Once | INTRAVENOUS | Status: AC

## 2021-08-04 MED ORDER — ACETAMINOPHEN 325 MG PO TABS: 650.0000 mg | ORAL_TABLET | ORAL | Status: DC | PRN

## 2021-08-04 MED ORDER — ACD FORMULA A 0.73-2.45-2.2 GM/100ML VI SOLN
Status: AC
  Filled 2021-08-04: qty 1000

## 2021-08-04 MED ORDER — ANTICOAGULANT SODIUM CITRATE 4% (200MG/5ML) IV SOLN
5.0000 mL | Freq: Once | Status: AC
  Administered 2021-08-04: 5 mL
  Filled 2021-08-04: qty 5

## 2021-08-04 MED ORDER — ACD FORMULA A 0.73-2.45-2.2 GM/100ML VI SOLN: 1000.0000 mL | Status: DC

## 2021-08-04 NOTE — Discharge Instructions (Signed)
Return on Friday, April 7 at 11 am for next apheresis treatment. ?

## 2021-08-05 ENCOUNTER — Inpatient Hospital Stay: Payer: Medicare Other

## 2021-08-05 VITALS — BP 133/64 | HR 79 | Temp 98.0°F | Resp 18 | Wt 181.2 lb

## 2021-08-05 DIAGNOSIS — M3119 Other thrombotic microangiopathy: Secondary | ICD-10-CM

## 2021-08-05 DIAGNOSIS — Z5112 Encounter for antineoplastic immunotherapy: Secondary | ICD-10-CM | POA: Diagnosis not present

## 2021-08-05 DIAGNOSIS — Z862 Personal history of diseases of the blood and blood-forming organs and certain disorders involving the immune mechanism: Secondary | ICD-10-CM

## 2021-08-05 LAB — THERAPEUTIC PLASMA EXCHANGE (BLOOD BANK)
Plasma Exchange: 2200
Plasma volume needed: 2200
Unit division: 0
Unit division: 0
Unit division: 0
Unit division: 0
Unit division: 0

## 2021-08-05 LAB — CBC WITH DIFFERENTIAL (CANCER CENTER ONLY)
Abs Immature Granulocytes: 0.48 10*3/uL — ABNORMAL HIGH (ref 0.00–0.07)
Basophils Absolute: 0.1 10*3/uL (ref 0.0–0.1)
Basophils Relative: 1 %
Eosinophils Absolute: 0 10*3/uL (ref 0.0–0.5)
Eosinophils Relative: 0 %
HCT: 45.2 % (ref 36.0–46.0)
Hemoglobin: 15 g/dL (ref 12.0–15.0)
Immature Granulocytes: 4 %
Lymphocytes Relative: 20 %
Lymphs Abs: 2.6 10*3/uL (ref 0.7–4.0)
MCH: 32.3 pg (ref 26.0–34.0)
MCHC: 33.2 g/dL (ref 30.0–36.0)
MCV: 97.2 fL (ref 80.0–100.0)
Monocytes Absolute: 1 10*3/uL (ref 0.1–1.0)
Monocytes Relative: 8 %
Neutro Abs: 8.9 10*3/uL — ABNORMAL HIGH (ref 1.7–7.7)
Neutrophils Relative %: 67 %
Platelet Count: 50 10*3/uL — ABNORMAL LOW (ref 150–400)
RBC: 4.65 MIL/uL (ref 3.87–5.11)
RDW: 14.6 % (ref 11.5–15.5)
WBC Count: 13.1 10*3/uL — ABNORMAL HIGH (ref 4.0–10.5)
nRBC: 0.2 % (ref 0.0–0.2)

## 2021-08-05 MED ORDER — SODIUM CHLORIDE 0.9 % IV SOLN
375.0000 mg/m2 | Freq: Once | INTRAVENOUS | Status: AC
  Administered 2021-08-05: 700 mg via INTRAVENOUS
  Filled 2021-08-05: qty 20

## 2021-08-05 MED ORDER — DIPHENHYDRAMINE HCL 25 MG PO CAPS
50.0000 mg | ORAL_CAPSULE | Freq: Once | ORAL | Status: AC
  Administered 2021-08-05: 50 mg via ORAL
  Filled 2021-08-05: qty 2

## 2021-08-05 MED ORDER — ACETAMINOPHEN 325 MG PO TABS
650.0000 mg | ORAL_TABLET | Freq: Once | ORAL | Status: AC
  Administered 2021-08-05: 650 mg via ORAL
  Filled 2021-08-05: qty 2

## 2021-08-05 MED ORDER — SODIUM CHLORIDE 0.9 % IV SOLN: Freq: Once | INTRAVENOUS | Status: AC

## 2021-08-05 NOTE — Progress Notes (Signed)
Patient presents for treatment. RN assessment completed along with the following: ? ?Labs/vitals reviewed - Yes, and within treatment parameters.   ?Weight within 10% of previous measurement - Yes ?Informed consent completed and reflects current therapy/intent - Yes, on date 07/05/13             ?Provider progress note reviewed - Patient not seen by provider today. Most recent note dated 08/03/21 reviewed. ?Treatment/Antibody/Supportive plan reviewed - Yes, and there are no adjustments needed for today's treatment. ?S&H and other orders reviewed - Yes, and there are no additional orders identified. ?Previous treatment date reviewed - Yes, and the appropriate amount of time has elapsed between treatments. ? ? ?Patient to proceed with treatment.   ?

## 2021-08-05 NOTE — Patient Instructions (Signed)
Rituximab Injection ?What is this medication? ?RITUXIMAB (ri TUX i mab) is a monoclonal antibody. It is used to treat certain types of cancer like non-Hodgkin lymphoma and chronic lymphocytic leukemia. It is also used to treat rheumatoid arthritis, granulomatosis with polyangiitis, microscopic polyangiitis, and pemphigus vulgaris. ?This medicine may be used for other purposes; ask your health care provider or pharmacist if you have questions. ?COMMON BRAND NAME(S): RIABNI, Rituxan, RUXIENCE ?What should I tell my care team before I take this medication? ?They need to know if you have any of these conditions: ?chest pain ?heart disease ?infection especially a viral infection such as chickenpox, cold sores, hepatitis B, or herpes ?immune system problems ?irregular heartbeat or rhythm ?kidney disease ?low blood counts (white cells, platelets, or red cells) ?lung disease ?recent or upcoming vaccine ?an unusual or allergic reaction to rituximab, other medicines, foods, dyes, or preservatives ?pregnant or trying to get pregnant ?breast-feeding ?How should I use this medication? ?This medicine is injected into a vein. It is given by a health care provider in a hospital or clinic setting. ?A special MedGuide will be given to you before each treatment. Be sure to read this information carefully each time. ?Talk to your health care provider about the use of this medicine in children. While this drug may be prescribed for children as young as 6 months for selected conditions, precautions do apply. ?Overdosage: If you think you have taken too much of this medicine contact a poison control center or emergency room at once. ?NOTE: This medicine is only for you. Do not share this medicine with others. ?What if I miss a dose? ?Keep appointments for follow-up doses. It is important not to miss your dose. Call your health care provider if you are unable to keep an appointment. ?What may interact with this medication? ?Do not take  this medicine with any of the following medicines: ?live vaccines ?This medicine may also interact with the following medicines: ?cisplatin ?This list may not describe all possible interactions. Give your health care provider a list of all the medicines, herbs, non-prescription drugs, or dietary supplements you use. Also tell them if you smoke, drink alcohol, or use illegal drugs. Some items may interact with your medicine. ?What should I watch for while using this medication? ?Your condition will be monitored carefully while you are receiving this medicine. You may need blood work done while you are taking this medicine. ?This medicine can cause serious infusion reactions. To reduce the risk your health care provider may give you other medicines to take before receiving this one. Be sure to follow the directions from your health care provider. ?This medicine may increase your risk of getting an infection. Call your health care provider for advice if you get a fever, chills, sore throat, or other symptoms of a cold or flu. Do not treat yourself. Try to avoid being around people who are sick. ?Call your health care provider if you are around anyone with measles, chickenpox, or if you develop sores or blisters that do not heal properly. ?Avoid taking medicines that contain aspirin, acetaminophen, ibuprofen, naproxen, or ketoprofen unless instructed by your health care provider. These medicines may hide a fever. ?This medicine may cause serious skin reactions. They can happen weeks to months after starting the medicine. Contact your health care provider right away if you notice fevers or flu-like symptoms with a rash. The rash may be red or purple and then turn into blisters or peeling of the skin. Or, you might  notice a red rash with swelling of the face, lips or lymph nodes in your neck or under your arms. ?In some patients, this medicine may cause a serious brain infection that may cause death. If you have any  problems seeing, thinking, speaking, walking, or standing, tell your healthcare professional right away. If you cannot reach your healthcare professional, urgently seek other source of medical care. ?Do not become pregnant while taking this medicine or for at least 12 months after stopping it. Women should inform their health care provider if they wish to become pregnant or think they might be pregnant. There is potential for serious harm to an unborn child. Talk to your health care provider for more information. Women should use a reliable form of birth control while taking this medicine and for 12 months after stopping it. Do not breast-feed while taking this medicine or for at least 6 months after stopping it. ?What side effects may I notice from receiving this medication? ?Side effects that you should report to your health care provider as soon as possible: ?allergic reactions (skin rash, itching or hives; swelling of the face, lips, or tongue) ?diarrhea ?edema (sudden weight gain; swelling of the ankles, feet, hands or other unusual swelling; trouble breathing) ?fast, irregular heartbeat ?heart attack (trouble breathing; pain or tightness in the chest, neck, back or arms; unusually weak or tired) ?infection (fever, chills, cough, sore throat, pain or trouble passing urine) ?kidney injury (trouble passing urine or change in the amount of urine) ?liver injury (dark yellow or brown urine; general ill feeling or flu-like symptoms; loss of appetite, right upper belly pain; unusually weak or tired, yellowing of the eyes or skin) ?low blood pressure (dizziness; feeling faint or lightheaded, falls; unusually weak or tired) ?low red blood cell counts (trouble breathing; feeling faint; lightheaded, falls; unusually weak or tired) ?mouth sores ?redness, blistering, peeling, or loosening of the skin, including inside the mouth ?stomach pain ?unusual bruising or bleeding ?wheezing (trouble breathing with loud or whistling  sounds) ?vomiting ?Side effects that usually do not require medical attention (report to your health care provider if they continue or are bothersome): ?headache ?joint pain ?muscle cramps, pain ?nausea ?This list may not describe all possible side effects. Call your doctor for medical advice about side effects. You may report side effects to FDA at 1-800-FDA-1088. ?Where should I keep my medication? ?This medicine is given in a hospital or clinic. It will not be stored at home. ?NOTE: This sheet is a summary. It may not cover all possible information. If you have questions about this medicine, talk to your doctor, pharmacist, or health care provider. ?? 2022 [employer]vier/Gold Standard (2020-04-20 00:00:00) ? ?

## 2021-08-06 ENCOUNTER — Other Ambulatory Visit: Payer: Medicare Other

## 2021-08-06 ENCOUNTER — Non-Acute Institutional Stay (HOSPITAL_COMMUNITY)
Admission: RE | Admit: 2021-08-06 | Discharge: 2021-08-06 | Disposition: A | Discharge status: Home / Self Care | Payer: Medicare Other | Source: Ambulatory Visit | Attending: Oncology | Admitting: Oncology

## 2021-08-06 ENCOUNTER — Ambulatory Visit: Payer: Medicare Other

## 2021-08-06 ENCOUNTER — Inpatient Hospital Stay: Payer: Medicare Other

## 2021-08-06 DIAGNOSIS — M3119 Other thrombotic microangiopathy: Secondary | ICD-10-CM | POA: Diagnosis present

## 2021-08-06 LAB — CBC WITH DIFFERENTIAL/PLATELET
Abs Immature Granulocytes: 0.55 10*3/uL — ABNORMAL HIGH (ref 0.00–0.07)
Basophils Absolute: 0.1 10*3/uL (ref 0.0–0.1)
Basophils Relative: 0 %
Eosinophils Absolute: 0 10*3/uL (ref 0.0–0.5)
Eosinophils Relative: 0 %
HCT: 40.8 % (ref 36.0–46.0)
Hemoglobin: 13.8 g/dL (ref 12.0–15.0)
Immature Granulocytes: 4 %
Lymphocytes Relative: 5 %
Lymphs Abs: 0.7 10*3/uL (ref 0.7–4.0)
MCH: 33.7 pg (ref 26.0–34.0)
MCHC: 33.8 g/dL (ref 30.0–36.0)
MCV: 99.5 fL (ref 80.0–100.0)
Monocytes Absolute: 0.5 10*3/uL (ref 0.1–1.0)
Monocytes Relative: 4 %
Neutro Abs: 11.4 10*3/uL — ABNORMAL HIGH (ref 1.7–7.7)
Neutrophils Relative %: 87 %
Platelets: 45 10*3/uL — ABNORMAL LOW (ref 150–400)
RBC: 4.1 MIL/uL (ref 3.87–5.11)
RDW: 14.5 % (ref 11.5–15.5)
WBC: 13.2 10*3/uL — ABNORMAL HIGH (ref 4.0–10.5)
nRBC: 0.2 % (ref 0.0–0.2)

## 2021-08-06 LAB — COMPREHENSIVE METABOLIC PANEL
ALT: 33 U/L (ref 0–44)
AST: 35 U/L (ref 15–41)
Albumin: 3.3 g/dL — ABNORMAL LOW (ref 3.5–5.0)
Alkaline Phosphatase: 53 U/L (ref 38–126)
Anion gap: 9 (ref 5–15)
BUN: 31 mg/dL — ABNORMAL HIGH (ref 8–23)
CO2: 28 mmol/L (ref 22–32)
Calcium: 9.1 mg/dL (ref 8.9–10.3)
Chloride: 102 mmol/L (ref 98–111)
Creatinine, Ser: 1.28 mg/dL — ABNORMAL HIGH (ref 0.44–1.00)
GFR, Estimated: 43 mL/min — ABNORMAL LOW (ref >60)
Glucose, Bld: 108 mg/dL — ABNORMAL HIGH (ref 70–99)
Potassium: 4.3 mmol/L (ref 3.5–5.1)
Sodium: 139 mmol/L (ref 135–145)
Total Bilirubin: 1 mg/dL (ref 0.3–1.2)
Total Protein: 5.3 g/dL — ABNORMAL LOW (ref 6.5–8.1)

## 2021-08-06 LAB — ADAMTS13 ANTIBODY: ADAMTS13 Antibody: 39 Units/mL — ABNORMAL HIGH (ref <12)

## 2021-08-06 LAB — ADAMTS13 ACTIVITY: Adamts 13 Activity: 2.5 % — CL (ref >66.8)

## 2021-08-06 MED ORDER — CALCIUM CARBONATE ANTACID 500 MG PO CHEW: 2.0000 | CHEWABLE_TABLET | ORAL | Status: DC

## 2021-08-06 MED ORDER — ACETAMINOPHEN 325 MG PO TABS
650.0000 mg | ORAL_TABLET | ORAL | Status: DC | PRN
  Administered 2021-08-06: 650 mg via ORAL
  Filled 2021-08-06: qty 2

## 2021-08-06 MED ORDER — ANTICOAGULANT SODIUM CITRATE 4% (200MG/5ML) IV SOLN
5.0000 mL | Freq: Once | Status: AC
  Administered 2021-08-06: 5 mL
  Filled 2021-08-06: qty 5

## 2021-08-06 MED ORDER — CALCIUM GLUCONATE-NACL 2-0.675 GM/100ML-% IV SOLN
2.0000 g | Freq: Once | INTRAVENOUS | Status: AC
  Administered 2021-08-06: 2000 mg via INTRAVENOUS
  Filled 2021-08-06: qty 100

## 2021-08-06 MED ORDER — ALTEPLASE 2 MG IJ SOLR
INTRAMUSCULAR | Status: AC
Start: 2021-08-06 — End: 2021-08-06
  Administered 2021-08-06: 4 mg
  Filled 2021-08-06: qty 4

## 2021-08-06 MED ORDER — ALTEPLASE 2 MG IJ SOLR: 4.0000 mg | Freq: Once | INTRAMUSCULAR | Status: AC

## 2021-08-06 MED ORDER — DIPHENHYDRAMINE HCL 25 MG PO CAPS
25.0000 mg | ORAL_CAPSULE | Freq: Four times a day (QID) | ORAL | Status: DC | PRN
  Administered 2021-08-06: 25 mg via ORAL
  Filled 2021-08-06: qty 1

## 2021-08-06 MED ORDER — ACD FORMULA A 0.73-2.45-2.2 GM/100ML VI SOLN
1000.0000 mL | Status: DC
  Administered 2021-08-06: 1000 mL
  Filled 2021-08-06: qty 1000

## 2021-08-06 NOTE — Procedures (Signed)
Pt arrived for TPE treatment in stable condition, Permcath dressing is clean and intact with no tenderness noted.  Upon attempt to access it was noted that the venous port was sluggish for blood return and flushing, arterial port had no blood return and was sluggish for flushing.  Sherrill MD was notified and orders were received to instill Cathflow via both ports as per hemodialysis policy.  After waiting 45 minutes both ports were giving blood return and flushing easily.  Plasmapheresis treatment was initiated.  Pt had no distress nor complaints throughout her treatment.  Following her treatment all blood was returned and she was instructed to return for her scheduled treatments on Monday Wednesday and Friday as per her MD order.  Na Citrate was instilled via cath ports and pt was discharged home with her husband in stable condition. She had no questions nor complaints.  ?

## 2021-08-06 NOTE — Discharge Instructions (Signed)
Please return to hospital on Monday at 11 am for TPE treatment as per your MD orders ?

## 2021-08-07 LAB — THERAPEUTIC PLASMA EXCHANGE (BLOOD BANK)
Plasma Exchange: 2200
Plasma volume needed: 2200
Unit division: 0
Unit division: 0
Unit division: 0
Unit division: 0
Unit division: 0
Unit division: 0
Unit division: 0
Unit division: 0
Unit division: 0

## 2021-08-08 ENCOUNTER — Other Ambulatory Visit: Payer: Self-pay | Admitting: Oncology

## 2021-08-09 ENCOUNTER — Non-Acute Institutional Stay (HOSPITAL_COMMUNITY)
Admission: RE | Admit: 2021-08-09 | Discharge: 2021-08-09 | Disposition: A | Discharge status: Home / Self Care | Payer: Medicare Other | Source: Ambulatory Visit | Attending: Oncology | Admitting: Oncology

## 2021-08-09 DIAGNOSIS — M3119 Other thrombotic microangiopathy: Secondary | ICD-10-CM | POA: Diagnosis present

## 2021-08-09 LAB — BASIC METABOLIC PANEL
Anion gap: 7 (ref 5–15)
BUN: 30 mg/dL — ABNORMAL HIGH (ref 8–23)
CO2: 29 mmol/L (ref 22–32)
Calcium: 9.2 mg/dL (ref 8.9–10.3)
Chloride: 102 mmol/L (ref 98–111)
Creatinine, Ser: 1.34 mg/dL — ABNORMAL HIGH (ref 0.44–1.00)
GFR, Estimated: 40 mL/min — ABNORMAL LOW (ref >60)
Glucose, Bld: 113 mg/dL — ABNORMAL HIGH (ref 70–99)
Potassium: 4.3 mmol/L (ref 3.5–5.1)
Sodium: 138 mmol/L (ref 135–145)

## 2021-08-09 LAB — CBC
HCT: 41.1 % (ref 36.0–46.0)
Hemoglobin: 13.5 g/dL (ref 12.0–15.0)
MCH: 33.5 pg (ref 26.0–34.0)
MCHC: 32.8 g/dL (ref 30.0–36.0)
MCV: 102 fL — ABNORMAL HIGH (ref 80.0–100.0)
Platelets: 71 10*3/uL — ABNORMAL LOW (ref 150–400)
RBC: 4.03 MIL/uL (ref 3.87–5.11)
RDW: 14.7 % (ref 11.5–15.5)
WBC: 11.4 10*3/uL — ABNORMAL HIGH (ref 4.0–10.5)
nRBC: 0.3 % — ABNORMAL HIGH (ref 0.0–0.2)

## 2021-08-09 MED ORDER — CALCIUM GLUCONATE-NACL 2-0.675 GM/100ML-% IV SOLN
2.0000 g | Freq: Once | INTRAVENOUS | Status: AC
  Administered 2021-08-09: 2000 mg via INTRAVENOUS
  Filled 2021-08-09: qty 100

## 2021-08-09 MED ORDER — CALCIUM CARBONATE ANTACID 500 MG PO CHEW
2.0000 | CHEWABLE_TABLET | ORAL | Status: DC
  Administered 2021-08-09: 400 mg via ORAL
  Filled 2021-08-09: qty 2

## 2021-08-09 MED ORDER — ACD FORMULA A 0.73-2.45-2.2 GM/100ML VI SOLN
1000.0000 mL | Status: DC
  Administered 2021-08-09: 1000 mL
  Filled 2021-08-09: qty 1000

## 2021-08-09 MED ORDER — DIPHENHYDRAMINE HCL 25 MG PO CAPS
25.0000 mg | ORAL_CAPSULE | Freq: Four times a day (QID) | ORAL | Status: DC | PRN
  Administered 2021-08-09: 25 mg via ORAL
  Filled 2021-08-09: qty 1

## 2021-08-09 MED ORDER — ACETAMINOPHEN 325 MG PO TABS
650.0000 mg | ORAL_TABLET | ORAL | Status: DC | PRN
  Administered 2021-08-09: 650 mg via ORAL
  Filled 2021-08-09: qty 2

## 2021-08-09 MED ORDER — ANTICOAGULANT SODIUM CITRATE 4% (200MG/5ML) IV SOLN
5.0000 mL | Freq: Once | Status: DC
  Filled 2021-08-09: qty 5

## 2021-08-09 NOTE — Discharge Instructions (Signed)
Please return on Wednesday 08/11/21 for your next Plasmapheresis treatment ?

## 2021-08-10 LAB — THERAPEUTIC PLASMA EXCHANGE (BLOOD BANK)
Plasma Exchange: 2200
Plasma volume needed: 2200
Unit division: 0
Unit division: 0
Unit division: 0
Unit division: 0
Unit division: 0

## 2021-08-11 ENCOUNTER — Non-Acute Institutional Stay (HOSPITAL_COMMUNITY)
Admission: AD | Admit: 2021-08-11 | Discharge: 2021-08-11 | Disposition: A | Discharge status: Home / Self Care | Payer: Medicare Other | Source: Ambulatory Visit | Attending: Oncology | Admitting: Oncology

## 2021-08-11 DIAGNOSIS — M3119 Other thrombotic microangiopathy: Secondary | ICD-10-CM | POA: Diagnosis present

## 2021-08-11 LAB — CBC
HCT: 41.7 % (ref 36.0–46.0)
Hemoglobin: 13.5 g/dL (ref 12.0–15.0)
MCH: 33.1 pg (ref 26.0–34.0)
MCHC: 32.4 g/dL (ref 30.0–36.0)
MCV: 102.2 fL — ABNORMAL HIGH (ref 80.0–100.0)
Platelets: 88 10*3/uL — ABNORMAL LOW (ref 150–400)
RBC: 4.08 MIL/uL (ref 3.87–5.11)
RDW: 14.7 % (ref 11.5–15.5)
WBC: 10.2 10*3/uL (ref 4.0–10.5)
nRBC: 0 % (ref 0.0–0.2)

## 2021-08-11 LAB — BASIC METABOLIC PANEL
Anion gap: 10 (ref 5–15)
BUN: 32 mg/dL — ABNORMAL HIGH (ref 8–23)
CO2: 28 mmol/L (ref 22–32)
Calcium: 8.4 mg/dL — ABNORMAL LOW (ref 8.9–10.3)
Chloride: 103 mmol/L (ref 98–111)
Creatinine, Ser: 1.25 mg/dL — ABNORMAL HIGH (ref 0.44–1.00)
GFR, Estimated: 44 mL/min — ABNORMAL LOW (ref >60)
Glucose, Bld: 170 mg/dL — ABNORMAL HIGH (ref 70–99)
Potassium: 4.2 mmol/L (ref 3.5–5.1)
Sodium: 141 mmol/L (ref 135–145)

## 2021-08-11 MED ORDER — CALCIUM GLUCONATE-NACL 2-0.675 GM/100ML-% IV SOLN
2.0000 g | Freq: Once | INTRAVENOUS | Status: AC
  Administered 2021-08-11: 2000 mg via INTRAVENOUS
  Filled 2021-08-11: qty 100

## 2021-08-11 MED ORDER — ACETAMINOPHEN 325 MG PO TABS
650.0000 mg | ORAL_TABLET | ORAL | Status: DC | PRN
  Administered 2021-08-11: 650 mg via ORAL
  Filled 2021-08-11: qty 2

## 2021-08-11 MED ORDER — DIPHENHYDRAMINE HCL 25 MG PO CAPS
25.0000 mg | ORAL_CAPSULE | Freq: Four times a day (QID) | ORAL | Status: DC | PRN
  Administered 2021-08-11: 25 mg via ORAL
  Filled 2021-08-11: qty 1

## 2021-08-11 MED ORDER — CALCIUM CARBONATE ANTACID 500 MG PO CHEW
2.0000 | CHEWABLE_TABLET | ORAL | Status: DC
  Administered 2021-08-11: 400 mg via ORAL
  Filled 2021-08-11: qty 2

## 2021-08-11 MED ORDER — ACD FORMULA A 0.73-2.45-2.2 GM/100ML VI SOLN
Status: AC
  Administered 2021-08-11: 1000 mL
  Filled 2021-08-11: qty 1000

## 2021-08-11 MED ORDER — ANTICOAGULANT SODIUM CITRATE 4% (200MG/5ML) IV SOLN
5.0000 mL | Freq: Once | Status: AC
  Administered 2021-08-11: 5 mL
  Filled 2021-08-11: qty 5

## 2021-08-11 MED ORDER — ACD FORMULA A 0.73-2.45-2.2 GM/100ML VI SOLN: 1000.0000 mL | Status: DC

## 2021-08-11 NOTE — Procedures (Signed)
Arrived to hemodialysis unit for TPE treatment in stable condition.  Permcath dressing changed as per policy.  Site is clean and dry with no drainage noted.  Arterial and Venous ports draw and flush easily.  Pt tolerated treatment well with vital signs stable throughout her treatment.  She was instructed to return on Friday for another treatment.  Has no questions nor complaints.  Was discharged with her husband. ?

## 2021-08-11 NOTE — Discharge Instructions (Signed)
Please return on Friday for TPE appt.  ?

## 2021-08-12 ENCOUNTER — Inpatient Hospital Stay: Payer: Medicare Other

## 2021-08-12 ENCOUNTER — Inpatient Hospital Stay (HOSPITAL_BASED_OUTPATIENT_CLINIC_OR_DEPARTMENT_OTHER): Payer: Medicare Other | Admitting: Oncology

## 2021-08-12 ENCOUNTER — Encounter: Payer: Self-pay | Admitting: *Deleted

## 2021-08-12 VITALS — BP 128/52 | HR 86 | Temp 97.8°F | Resp 18

## 2021-08-12 VITALS — BP 143/54 | HR 88 | Temp 98.2°F | Resp 18 | Ht 64.0 in | Wt 183.0 lb

## 2021-08-12 DIAGNOSIS — M3119 Other thrombotic microangiopathy: Secondary | ICD-10-CM

## 2021-08-12 DIAGNOSIS — Z862 Personal history of diseases of the blood and blood-forming organs and certain disorders involving the immune mechanism: Secondary | ICD-10-CM

## 2021-08-12 DIAGNOSIS — Z5112 Encounter for antineoplastic immunotherapy: Secondary | ICD-10-CM | POA: Diagnosis not present

## 2021-08-12 LAB — THERAPEUTIC PLASMA EXCHANGE (BLOOD BANK)
Plasma Exchange: 2200
Plasma volume needed: 2200
Unit division: 0
Unit division: 0
Unit division: 0
Unit division: 0
Unit division: 0
Unit division: 0
Unit division: 0

## 2021-08-12 LAB — CMP (CANCER CENTER ONLY)
ALT: 31 U/L (ref 0–44)
AST: 30 U/L (ref 15–41)
Albumin: 4 g/dL (ref 3.5–5.0)
Alkaline Phosphatase: 45 U/L (ref 38–126)
Anion gap: 8 (ref 5–15)
BUN: 32 mg/dL — ABNORMAL HIGH (ref 8–23)
CO2: 31 mmol/L (ref 22–32)
Calcium: 9 mg/dL (ref 8.9–10.3)
Chloride: 102 mmol/L (ref 98–111)
Creatinine: 1.22 mg/dL — ABNORMAL HIGH (ref 0.44–1.00)
GFR, Estimated: 45 mL/min — ABNORMAL LOW (ref >60)
Glucose, Bld: 82 mg/dL (ref 70–99)
Potassium: 4 mmol/L (ref 3.5–5.1)
Sodium: 141 mmol/L (ref 135–145)
Total Bilirubin: 0.8 mg/dL (ref 0.3–1.2)
Total Protein: 6.3 g/dL — ABNORMAL LOW (ref 6.5–8.1)

## 2021-08-12 LAB — CBC WITH DIFFERENTIAL (CANCER CENTER ONLY)
Abs Immature Granulocytes: 0.36 10*3/uL — ABNORMAL HIGH (ref 0.00–0.07)
Basophils Absolute: 0.1 10*3/uL (ref 0.0–0.1)
Basophils Relative: 1 %
Eosinophils Absolute: 0.1 10*3/uL (ref 0.0–0.5)
Eosinophils Relative: 1 %
HCT: 47.8 % — ABNORMAL HIGH (ref 36.0–46.0)
Hemoglobin: 15.4 g/dL — ABNORMAL HIGH (ref 12.0–15.0)
Immature Granulocytes: 4 %
Lymphocytes Relative: 21 %
Lymphs Abs: 2.1 10*3/uL (ref 0.7–4.0)
MCH: 32.2 pg (ref 26.0–34.0)
MCHC: 32.2 g/dL (ref 30.0–36.0)
MCV: 100 fL (ref 80.0–100.0)
Monocytes Absolute: 0.7 10*3/uL (ref 0.1–1.0)
Monocytes Relative: 7 %
Neutro Abs: 6.7 10*3/uL (ref 1.7–7.7)
Neutrophils Relative %: 66 %
Platelet Count: 94 10*3/uL — ABNORMAL LOW (ref 150–400)
RBC: 4.78 MIL/uL (ref 3.87–5.11)
RDW: 14.6 % (ref 11.5–15.5)
WBC Count: 10 10*3/uL (ref 4.0–10.5)
nRBC: 0.2 % (ref 0.0–0.2)

## 2021-08-12 LAB — LACTATE DEHYDROGENASE: LDH: 273 U/L — ABNORMAL HIGH (ref 98–192)

## 2021-08-12 MED ORDER — SODIUM CHLORIDE 0.9 % IV SOLN: Freq: Once | INTRAVENOUS | Status: AC

## 2021-08-12 MED ORDER — SODIUM CHLORIDE 0.9 % IV SOLN
375.0000 mg/m2 | Freq: Once | INTRAVENOUS | Status: AC
  Administered 2021-08-12: 700 mg via INTRAVENOUS
  Filled 2021-08-12: qty 20

## 2021-08-12 MED ORDER — ACETAMINOPHEN 325 MG PO TABS
650.0000 mg | ORAL_TABLET | Freq: Once | ORAL | Status: AC
  Administered 2021-08-12: 650 mg via ORAL
  Filled 2021-08-12: qty 2

## 2021-08-12 MED ORDER — DIPHENHYDRAMINE HCL 25 MG PO CAPS
50.0000 mg | ORAL_CAPSULE | Freq: Once | ORAL | Status: AC
  Administered 2021-08-12: 50 mg via ORAL
  Filled 2021-08-12: qty 2

## 2021-08-12 NOTE — Progress Notes (Signed)
Patient seen by Dr. Benay Spice today ? ?Vitals are within treatment parameters. ? ?Labs reviewed by Dr. Benay Spice  OK to treat w/current CBC. May start treatment without CMP ? ?Per physician team, patient is ready for treatment and there are NO modifications to the treatment plan.  ?

## 2021-08-12 NOTE — Progress Notes (Signed)
?[name] ?OFFICE PROGRESS NOTE ? ? ?Diagnosis: TTP ? ?INTERVAL HISTORY:  ? ?[name] returns as scheduled.  She completed another treat with rituximab on 08/05/2021.  She tolerated rituximab well.  She has continued plasmapheresis on a Monday, Wednesday, and Friday schedule.  She reports no rash or symptoms of hypocalcemia.  No bleeding.  She feels well.  She has noted increased leg and foot edema. ? ?Objective: ? ?Vital signs in last 24 hours: ? ?Blood pressure (!) 143/54, pulse 88, temperature 98.2 ?F (36.8 ?C), temperature source Oral, resp. rate 18, height '5\' 4"'$  (1.626 m), weight 183 lb (83 kg), SpO2 99 %. ?  ? ?HEENT: No thrush, cushingoid changes of the face ?Resp: Lungs clear bilaterally ?Cardio: Regular rate and rhythm, 2/6 diastolic murmur ?GI: No hepatosplenomegaly, nontender ?Vascular: 1+ pitting edema at the lower leg and foot bilaterally  ?  ? ?Portacath/PICC-without erythema ? ?Lab Results: ? ?Lab Results  ?Component Value Date  ? WBC 10.2 08/11/2021  ? HGB 13.5 08/11/2021  ? HCT 41.7 08/11/2021  ? MCV 102.2 (H) 08/11/2021  ? PLT 88 (L) 08/11/2021  ? NEUTROABS 11.4 (H) 08/06/2021  ? ? ?CMP  ?Lab Results  ?Component Value Date  ? NA 141 08/11/2021  ? K 4.2 08/11/2021  ? CL 103 08/11/2021  ? CO2 28 08/11/2021  ? GLUCOSE 170 (H) 08/11/2021  ? BUN 32 (H) 08/11/2021  ? CREATININE 1.25 (H) 08/11/2021  ? CALCIUM 8.4 (L) 08/11/2021  ? PROT 5.3 (L) 08/06/2021  ? ALBUMIN 3.3 (L) 08/06/2021  ? AST 35 08/06/2021  ? ALT 33 08/06/2021  ? ALKPHOS 53 08/06/2021  ? BILITOT 1.0 08/06/2021  ? GFRNONAA 44 (L) 08/11/2021  ? GFRAA 50 (L) 11/25/2019  ? ? ?Lab Results  ?Component Value Date  ? CEA 2.07 07/08/2021  ? ? ? ? ?Medications: I have reviewed the patient's current medications. ? ? ?Assessment/Plan: ?TTP diagnosed in 2005, treated with plasma exchange, steroids, and rituximab consolidation ?Relapse of TTP October 2011-plasma exchange, steroids, rituximab ?Relapse of TTP March 2015-plasma exchange,  steroids, rituximab, and maintenance azathioprine ?Admission with severe thrombocytopenia and elevated LDH 07/16/2020, ADAMTS13 activity less than 2%, consistent with relapse of TTP ?      -Steroids/FFP given 07/16/2020 ?      -Daily plasma exchange beginning 07/17/2020, 7 days, then qod starting 3/24, discharge 07/21/2020 on prednisone 60 mg daily ?      -Prednisone taper to 40 mg daily 07/29/2020 ?      -rituximab 07/30/2020, 08/06/2020, 08/13/2020, 08/20/2020 ?      -plasmapheresis on 07/31/2020, 08/03/2020, 08/05/2020, and 08/07/2020 ?      -prednisone increased to 60 mg daily 08/17/2020 ?      -Plasmapheresis 08/24/2020, 08/25/2020, 08/26/2020, 08/27/2020, 08/28/2020, 08/29/2020, 08/31/2020, 09/02/2020, 09/04/2020 ?      -caplacizumab initiated 08/24/2020 ?      -Decrease prednisone to 40 mg daily 08/28/2020 ?      -ADAMTS13 37% on 09/04/2020 ?      -Prednisone decreased to 30 mg daily 09/07/2020 ?      -Continue caplacizumab ?      -prednisone decreased to 20 mg daily beginning 09/18/2020 ?      -Cablivi discontinued 09/21/2020 ?      -prednisone discontinued 09/24/2020, resumed at a dose of 20 mg daily on 10/01/2020 ?      -prednisone taper to 10 mg daily 10/23/2020 ?      -Prednisone taper to 5 mg daily 11/06/2020 ?      -  prednisone continued at a dose of 5 mg daily, Imuran resumed at 50 mg daily 12/03/2020 ?      - admission has 06/19/2021 with relapse of TTP, daily plasma exchange and Solu-Medrol beginning 06/20/2021, 1 dose of caplacizumab 06/20/2021, discharged to home on 60 mg daily prednisone 06/26/2021, Monday, Wednesday, Friday plasmapheresis starting 06/28/2021; plasmapheresis 07/05/2021 and 07/07/2021, continue prednisone 40 mg daily ?     -Rituximab weekly for 4 weeks starting 07/23/2021, 07/29/2021, 08/05/2021, 08/12/2021 ?     -Outpatient plasmapheresis 07/28/2021,08/02/2021, 08/04/2021, 08/06/2021, 08/09/2021, 08/11/2021, 08/13/2021 ?-Prednisone 60 mg daily, changed to 40 mg daily for 13 2023 ?  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 Cristina [facility]- no active bleeding or blood in the stomach, nor any prospective bleeding site, seen on exam. 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 Jeral Olson on 10/07/2020; pelvic ultrasound 10/23/2020 with 5.1 cm diameter complex cyst left ovary containing a 12 mm mural nodule which lacks internal blood flow; per phone note 10/28/2020 Dr. Berline Olson recommends pelvic ultrasound at a 58-monthinterval ?CT 05/31/2021-5 mm left upper lobe pulmonary nodule not imaged on previous imaging studies.  Filling defect with enhancement in the left posterior lateral urinary bladder base near the left UVJ.  Lobulated left adnexal structure with low-attenuation but with intermediate density. ?  ?  ? ? ?Disposition: ?[name] tolerating the rituximab and plasmapheresis well.  The platelet count is improved.  We will follow-up on the LDH from today.  She will complete a final treatment with rituximab today.  The prednisone will be tapered to 40 mg daily.  She appears to be developing cushingoid change and edema from prednisone. ? ?The plan is to continue plasmapheresis on a Monday, Wednesday, and Friday schedule for the next week.  She will return for an office visit on 08/19/2021. ? ?Krystal Coder MD ? ?08/12/2021  ?10:55 AM ? ? ?

## 2021-08-12 NOTE — Patient Instructions (Signed)
[place]   ?Discharge Instructions: ?Thank you for choosing [facility] to provide your oncology and hematology care.  ? ?If you have a lab appointment with the [facility], please go directly to the [facility] and check in at the registration area. ?  ?Wear comfortable clothing and clothing appropriate for easy access to any Portacath or PICC line.  ? ?We strive to give you quality time with your provider. You may need to reschedule your appointment if you arrive late (15 or more minutes).  Arriving late affects you and other patients whose appointments are after yours.  Also, if you miss three or more appointments without notifying the office, you may be dismissed from the clinic at the provider?s discretion.    ?  ?For prescription refill requests, have your pharmacy contact our office and allow 72 hours for refills to be completed.   ? ?Today you received the following chemotherapy and/or immunotherapy agents Rituximab    ?  ?To help prevent nausea and vomiting after your treatment, we encourage you to take your nausea medication as directed. ? ?BELOW ARE SYMPTOMS THAT SHOULD BE REPORTED IMMEDIATELY: ?*FEVER GREATER THAN 100.4 F (38 ?C) OR HIGHER ?*CHILLS OR SWEATING ?*NAUSEA AND VOMITING THAT IS NOT CONTROLLED WITH YOUR NAUSEA MEDICATION ?*UNUSUAL SHORTNESS OF BREATH ?*UNUSUAL BRUISING OR BLEEDING ?*URINARY PROBLEMS (pain or burning when urinating, or frequent urination) ?*BOWEL PROBLEMS (unusual diarrhea, constipation, pain near the anus) ?TENDERNESS IN MOUTH AND THROAT WITH OR WITHOUT PRESENCE OF ULCERS (sore throat, sores in mouth, or a toothache) ?UNUSUAL RASH, SWELLING OR PAIN  ?UNUSUAL VAGINAL DISCHARGE OR ITCHING  ? ?Items with * indicate a potential emergency and should be followed up as soon as possible or go to the Emergency Department if any problems should occur. ? ?Please show the CHEMOTHERAPY ALERT CARD or IMMUNOTHERAPY ALERT CARD at check-in to  the Emergency Department and triage nurse. ? ?Should you have questions after your visit or need to cancel or reschedule your appointment, please contact [facility]  Dept: [phone]  and follow the prompts.  Office hours are 8:00 a.m. to 4:30 p.m. Monday - Friday. Please note that voicemails left after 4:00 p.m. may not be returned until the following business day.  We are closed weekends and major holidays. You have access to a nurse at all times for urgent questions. Please call the main number to the clinic Dept: [phone] and follow the prompts. ? ? ?For any non-urgent questions, you may also contact your provider using MyChart. We now offer e-Visits for anyone 93 and older to request care online for non-urgent symptoms. For details visit [email or web address]. ?  ?Also download the MyChart app! Go to the app store, search "MyChart", open the app, select [facility], and log in with your MyChart username and password. ? ?Due to Covid, a mask is required upon entering the hospital/clinic. If you do not have a mask, one will be given to you upon arrival. For doctor visits, patients may have 1 support person aged 84 or older with them. For treatment visits, patients cannot have anyone with them due to current Covid guidelines and our immunocompromised population.  ? ?Rituximab Injection ?What is this medication? ?RITUXIMAB (ri TUX i mab) is a monoclonal antibody. It is used to treat certain types of cancer like non-Hodgkin lymphoma and chronic lymphocytic leukemia. It is also used to treat rheumatoid arthritis, granulomatosis with polyangiitis, microscopic polyangiitis, and pemphigus vulgaris. ?This  medicine may be used for other purposes; ask your health care provider or pharmacist if you have questions. ?COMMON BRAND NAME(S): RIABNI, Rituxan, RUXIENCE ?What should I tell my care team before I take this medication? ?They need to know if you have any of these conditions: ?chest  pain ?heart disease ?infection especially a viral infection such as chickenpox, cold sores, hepatitis B, or herpes ?immune system problems ?irregular heartbeat or rhythm ?kidney disease ?low blood counts (white cells, platelets, or red cells) ?lung disease ?recent or upcoming vaccine ?an unusual or allergic reaction to rituximab, other medicines, foods, dyes, or preservatives ?pregnant or trying to get pregnant ?breast-feeding ?How should I use this medication? ?This medicine is injected into a vein. It is given by a health care provider in a hospital or clinic setting. ?A special MedGuide will be given to you before each treatment. Be sure to read this information carefully each time. ?Talk to your health care provider about the use of this medicine in children. While this drug may be prescribed for children as young as 6 months for selected conditions, precautions do apply. ?Overdosage: If you think you have taken too much of this medicine contact a poison control center or emergency room at once. ?NOTE: This medicine is only for you. Do not share this medicine with others. ?What if I miss a dose? ?Keep appointments for follow-up doses. It is important not to miss your dose. Call your health care provider if you are unable to keep an appointment. ?What may interact with this medication? ?Do not take this medicine with any of the following medicines: ?live vaccines ?This medicine may also interact with the following medicines: ?cisplatin ?This list may not describe all possible interactions. Give your health care provider a list of all the medicines, herbs, non-prescription drugs, or dietary supplements you use. Also tell them if you smoke, drink alcohol, or use illegal drugs. Some items may interact with your medicine. ?What should I watch for while using this medication? ?Your condition will be monitored carefully while you are receiving this medicine. You may need blood work done while you are taking this  medicine. ?This medicine can cause serious infusion reactions. To reduce the risk your health care provider may give you other medicines to take before receiving this one. Be sure to follow the directions from your health care provider. ?This medicine may increase your risk of getting an infection. Call your health care provider for advice if you get a fever, chills, sore throat, or other symptoms of a cold or flu. Do not treat yourself. Try to avoid being around people who are sick. ?Call your health care provider if you are around anyone with measles, chickenpox, or if you develop sores or blisters that do not heal properly. ?Avoid taking medicines that contain aspirin, acetaminophen, ibuprofen, naproxen, or ketoprofen unless instructed by your health care provider. These medicines may hide a fever. ?This medicine may cause serious skin reactions. They can happen weeks to months after starting the medicine. Contact your health care provider right away if you notice fevers or flu-like symptoms with a rash. The rash may be red or purple and then turn into blisters or peeling of the skin. Or, you might notice a red rash with swelling of the face, lips or lymph nodes in your neck or under your arms. ?In some patients, this medicine may cause a serious brain infection that may cause death. If you have any problems seeing, thinking, speaking, walking, or standing, tell  your healthcare professional right away. If you cannot reach your healthcare professional, urgently seek other source of medical care. ?Do not become pregnant while taking this medicine or for at least 12 months after stopping it. Women should inform their health care provider if they wish to become pregnant or think they might be pregnant. There is potential for serious harm to an unborn child. Talk to your health care provider for more information. Women should use a reliable form of birth control while taking this medicine and for 12 months after  stopping it. Do not breast-feed while taking this medicine or for at least 6 months after stopping it. ?What side effects may I notice from receiving this medication? ?Side effects that you should report to your he

## 2021-08-12 NOTE — Progress Notes (Signed)
Patient presents for treatment. RN assessment completed along with the following: ? ?Labs/vitals reviewed - Labs reviewed by Dr. Benay Spice  OK to treat w/current CBC. May start treatment without CMP ?    ?Weight within 10% of previous measurement - Yes ?Informed consent completed and reflects current therapy/intent - Yes, on date 07/05/13             ?Provider progress note reviewed - Yes, today's provider note was reviewed. ?Treatment/Antibody/Supportive plan reviewed - Yes, and there are no adjustments needed for today's treatment. ?S&H and other orders reviewed - Yes, and there are no additional orders identified. ?Previous treatment date reviewed - Yes, and the appropriate amount of time has elapsed between treatments. ?Clinic Hand Off Received from - Cristy Friedlander, RN ? ?Patient to proceed with treatment. + ?

## 2021-08-13 ENCOUNTER — Other Ambulatory Visit: Payer: Medicare Other

## 2021-08-13 ENCOUNTER — Ambulatory Visit: Payer: Medicare Other

## 2021-08-13 ENCOUNTER — Ambulatory Visit (HOSPITAL_COMMUNITY): Admission: RE | Admit: 2021-08-13 | Payer: Medicare Other | Source: Home / Self Care

## 2021-08-13 ENCOUNTER — Non-Acute Institutional Stay (HOSPITAL_COMMUNITY)
Admission: RE | Admit: 2021-08-13 | Discharge: 2021-08-13 | Disposition: A | Discharge status: Home / Self Care | Payer: Medicare Other | Attending: Oncology | Admitting: Oncology

## 2021-08-13 ENCOUNTER — Inpatient Hospital Stay: Payer: Medicare Other

## 2021-08-13 DIAGNOSIS — M3119 Other thrombotic microangiopathy: Secondary | ICD-10-CM | POA: Diagnosis present

## 2021-08-13 MED ORDER — DIPHENHYDRAMINE HCL 25 MG PO CAPS
25.0000 mg | ORAL_CAPSULE | Freq: Four times a day (QID) | ORAL | Status: DC | PRN
  Administered 2021-08-13: 25 mg via ORAL
  Filled 2021-08-13: qty 1

## 2021-08-13 MED ORDER — ANTICOAGULANT SODIUM CITRATE 4% (200MG/5ML) IV SOLN
5.0000 mL | Freq: Once | Status: AC
  Administered 2021-08-13: 5 mL
  Filled 2021-08-13: qty 5

## 2021-08-13 MED ORDER — ACD FORMULA A 0.73-2.45-2.2 GM/100ML VI SOLN
1000.0000 mL | Status: DC
  Filled 2021-08-13: qty 1000

## 2021-08-13 MED ORDER — CALCIUM GLUCONATE 10 % IV SOLN: 2.0000 g | Freq: Once | INTRAVENOUS | Status: DC

## 2021-08-13 MED ORDER — CALCIUM GLUCONATE-NACL 2-0.675 GM/100ML-% IV SOLN
2.0000 g | Freq: Once | INTRAVENOUS | Status: AC
  Administered 2021-08-13: 2000 mg via INTRAVENOUS
  Filled 2021-08-13: qty 100

## 2021-08-13 MED ORDER — ACETAMINOPHEN 325 MG PO TABS
650.0000 mg | ORAL_TABLET | ORAL | Status: DC | PRN
  Administered 2021-08-13: 650 mg via ORAL
  Filled 2021-08-13: qty 2

## 2021-08-14 LAB — THERAPEUTIC PLASMA EXCHANGE (BLOOD BANK)
Plasma Exchange: 2200
Plasma volume needed: 2200
Unit division: 0
Unit division: 0
Unit division: 0
Unit division: 0
Unit division: 0
Unit division: 0

## 2021-08-16 ENCOUNTER — Non-Acute Institutional Stay (HOSPITAL_COMMUNITY)
Admission: RE | Admit: 2021-08-16 | Discharge: 2021-08-16 | Disposition: A | Discharge status: Home / Self Care | Payer: Medicare Other | Attending: Neurology | Admitting: Neurology

## 2021-08-16 DIAGNOSIS — M3119 Other thrombotic microangiopathy: Secondary | ICD-10-CM | POA: Diagnosis present

## 2021-08-16 LAB — CBC
HCT: 41 % (ref 36.0–46.0)
Hemoglobin: 13.1 g/dL (ref 12.0–15.0)
MCH: 32.6 pg (ref 26.0–34.0)
MCHC: 32 g/dL (ref 30.0–36.0)
MCV: 102 fL — ABNORMAL HIGH (ref 80.0–100.0)
Platelets: 109 10*3/uL — ABNORMAL LOW (ref 150–400)
RBC: 4.02 MIL/uL (ref 3.87–5.11)
RDW: 14.6 % (ref 11.5–15.5)
WBC: 10.6 10*3/uL — ABNORMAL HIGH (ref 4.0–10.5)
nRBC: 0 % (ref 0.0–0.2)

## 2021-08-16 LAB — BASIC METABOLIC PANEL
Anion gap: 9 (ref 5–15)
BUN: 25 mg/dL — ABNORMAL HIGH (ref 8–23)
CO2: 28 mmol/L (ref 22–32)
Calcium: 9.4 mg/dL (ref 8.9–10.3)
Chloride: 104 mmol/L (ref 98–111)
Creatinine, Ser: 1.29 mg/dL — ABNORMAL HIGH (ref 0.44–1.00)
GFR, Estimated: 42 mL/min — ABNORMAL LOW (ref >60)
Glucose, Bld: 69 mg/dL — ABNORMAL LOW (ref 70–99)
Potassium: 3.8 mmol/L (ref 3.5–5.1)
Sodium: 141 mmol/L (ref 135–145)

## 2021-08-16 MED ORDER — ACETAMINOPHEN 325 MG PO TABS
650.0000 mg | ORAL_TABLET | ORAL | Status: DC | PRN
  Administered 2021-08-16: 650 mg via ORAL
  Filled 2021-08-16: qty 2

## 2021-08-16 MED ORDER — DIPHENHYDRAMINE HCL 25 MG PO CAPS
25.0000 mg | ORAL_CAPSULE | Freq: Four times a day (QID) | ORAL | Status: DC | PRN
  Administered 2021-08-16: 25 mg via ORAL
  Filled 2021-08-16: qty 1

## 2021-08-16 MED ORDER — CALCIUM GLUCONATE-NACL 2-0.675 GM/100ML-% IV SOLN
2.0000 g | Freq: Once | INTRAVENOUS | Status: DC
  Filled 2021-08-16: qty 100

## 2021-08-16 MED ORDER — CALCIUM GLUCONATE 10 % IV SOLN: 2.0000 g | Freq: Once | INTRAVENOUS | Status: DC

## 2021-08-16 MED ORDER — ACD FORMULA A 0.73-2.45-2.2 GM/100ML VI SOLN
1000.0000 mL | Status: DC
  Administered 2021-08-16: 1000 mL
  Filled 2021-08-16: qty 1000

## 2021-08-16 MED ORDER — ANTICOAGULANT SODIUM CITRATE 4% (200MG/5ML) IV SOLN
5.0000 mL | Freq: Once | Status: DC
  Filled 2021-08-16 (×2): qty 5

## 2021-08-17 LAB — THERAPEUTIC PLASMA EXCHANGE (BLOOD BANK)
Plasma Exchange: 2200
Plasma volume needed: 2200
Unit division: 0
Unit division: 0
Unit division: 0
Unit division: 0
Unit division: 0

## 2021-08-18 ENCOUNTER — Non-Acute Institutional Stay (HOSPITAL_COMMUNITY)
Admission: AD | Admit: 2021-08-18 | Discharge: 2021-08-18 | Disposition: A | Discharge status: Home / Self Care | Payer: Medicare Other | Source: Other Acute Inpatient Hospital | Attending: Oncology | Admitting: Oncology

## 2021-08-18 DIAGNOSIS — M3119 Other thrombotic microangiopathy: Secondary | ICD-10-CM | POA: Diagnosis present

## 2021-08-18 MED ORDER — ACD FORMULA A 0.73-2.45-2.2 GM/100ML VI SOLN: 1000.0000 mL | Status: DC

## 2021-08-18 MED ORDER — DIPHENHYDRAMINE HCL 25 MG PO CAPS
25.0000 mg | ORAL_CAPSULE | Freq: Four times a day (QID) | ORAL | Status: DC | PRN
  Administered 2021-08-18: 25 mg via ORAL
  Filled 2021-08-18 (×2): qty 1

## 2021-08-18 MED ORDER — ANTICOAGULANT SODIUM CITRATE 4% (200MG/5ML) IV SOLN
5.0000 mL | Freq: Once | Status: DC
  Filled 2021-08-18: qty 5

## 2021-08-18 MED ORDER — CALCIUM CARBONATE ANTACID 500 MG PO CHEW
2.0000 | CHEWABLE_TABLET | ORAL | Status: DC
  Administered 2021-08-18: 400 mg via ORAL
  Filled 2021-08-18: qty 2

## 2021-08-18 MED ORDER — ACD FORMULA A 0.73-2.45-2.2 GM/100ML VI SOLN
Status: AC
Start: 2021-08-18 — End: 2021-08-18
  Administered 2021-08-18: 1000 mL
  Filled 2021-08-18: qty 1000

## 2021-08-18 MED ORDER — ACETAMINOPHEN 325 MG PO TABS
650.0000 mg | ORAL_TABLET | ORAL | Status: DC | PRN
  Administered 2021-08-18: 650 mg via ORAL
  Filled 2021-08-18: qty 2

## 2021-08-18 MED ORDER — CALCIUM GLUCONATE-NACL 2-0.675 GM/100ML-% IV SOLN
2.0000 g | Freq: Once | INTRAVENOUS | Status: AC
  Administered 2021-08-18: 2000 mg via INTRAVENOUS
  Filled 2021-08-18: qty 100

## 2021-08-18 NOTE — Discharge Instructions (Signed)
Return to see Benay Spice MD tomorrow then on Friday you have an appt with [place] on Friday at 11am.  ? ?

## 2021-08-18 NOTE — Procedures (Signed)
Pt tolerated Plasmapheresis well, no complaints throughout treatment.  Pt discharged to home with her husband in stable condition.  Pt was educated to return on Friday for her next PLEX  treatment.  Pt was reminded to see Benay Spice MD for her appt tomorrow. ?

## 2021-08-19 ENCOUNTER — Inpatient Hospital Stay (HOSPITAL_BASED_OUTPATIENT_CLINIC_OR_DEPARTMENT_OTHER): Payer: Medicare Other | Admitting: Oncology

## 2021-08-19 ENCOUNTER — Inpatient Hospital Stay: Payer: Medicare Other

## 2021-08-19 ENCOUNTER — Other Ambulatory Visit: Payer: Medicare Other

## 2021-08-19 VITALS — BP 149/67 | HR 95 | Temp 98.2°F | Resp 18 | Ht 64.0 in | Wt 182.0 lb

## 2021-08-19 DIAGNOSIS — M3119 Other thrombotic microangiopathy: Secondary | ICD-10-CM

## 2021-08-19 DIAGNOSIS — Z5112 Encounter for antineoplastic immunotherapy: Secondary | ICD-10-CM | POA: Diagnosis not present

## 2021-08-19 LAB — THERAPEUTIC PLASMA EXCHANGE (BLOOD BANK)
Plasma Exchange: 2200
Plasma volume needed: 2200
Unit division: 0
Unit division: 0
Unit division: 0
Unit division: 0

## 2021-08-19 LAB — CMP (CANCER CENTER ONLY)
ALT: 23 U/L (ref 0–44)
AST: 23 U/L (ref 15–41)
Albumin: 3.7 g/dL (ref 3.5–5.0)
Alkaline Phosphatase: 55 U/L (ref 38–126)
Anion gap: 5 (ref 5–15)
BUN: 32 mg/dL — ABNORMAL HIGH (ref 8–23)
CO2: 33 mmol/L — ABNORMAL HIGH (ref 22–32)
Calcium: 10.3 mg/dL (ref 8.9–10.3)
Chloride: 102 mmol/L (ref 98–111)
Creatinine: 1.34 mg/dL — ABNORMAL HIGH (ref 0.44–1.00)
GFR, Estimated: 40 mL/min — ABNORMAL LOW (ref >60)
Glucose, Bld: 101 mg/dL — ABNORMAL HIGH (ref 70–99)
Potassium: 4.7 mmol/L (ref 3.5–5.1)
Sodium: 140 mmol/L (ref 135–145)
Total Bilirubin: 0.6 mg/dL (ref 0.3–1.2)
Total Protein: 6 g/dL — ABNORMAL LOW (ref 6.5–8.1)

## 2021-08-19 LAB — CBC WITH DIFFERENTIAL (CANCER CENTER ONLY)
Abs Immature Granulocytes: 0.16 10*3/uL — ABNORMAL HIGH (ref 0.00–0.07)
Basophils Absolute: 0 10*3/uL (ref 0.0–0.1)
Basophils Relative: 0 %
Eosinophils Absolute: 0.1 10*3/uL (ref 0.0–0.5)
Eosinophils Relative: 1 %
HCT: 43.5 % (ref 36.0–46.0)
Hemoglobin: 14.1 g/dL (ref 12.0–15.0)
Immature Granulocytes: 2 %
Lymphocytes Relative: 12 %
Lymphs Abs: 1.2 10*3/uL (ref 0.7–4.0)
MCH: 32.8 pg (ref 26.0–34.0)
MCHC: 32.4 g/dL (ref 30.0–36.0)
MCV: 101.2 fL — ABNORMAL HIGH (ref 80.0–100.0)
Monocytes Absolute: 0.6 10*3/uL (ref 0.1–1.0)
Monocytes Relative: 5 %
Neutro Abs: 8.4 10*3/uL — ABNORMAL HIGH (ref 1.7–7.7)
Neutrophils Relative %: 80 %
Platelet Count: 115 10*3/uL — ABNORMAL LOW (ref 150–400)
RBC: 4.3 MIL/uL (ref 3.87–5.11)
RDW: 14.6 % (ref 11.5–15.5)
WBC Count: 10.4 10*3/uL (ref 4.0–10.5)
nRBC: 0 % (ref 0.0–0.2)

## 2021-08-19 LAB — LACTATE DEHYDROGENASE: LDH: 275 U/L — ABNORMAL HIGH (ref 98–192)

## 2021-08-19 LAB — MAGNESIUM: Magnesium: 2.1 mg/dL (ref 1.7–2.4)

## 2021-08-19 NOTE — Progress Notes (Signed)
?[facility] ?OFFICE PROGRESS NOTE ? ? ?Diagnosis: TTP ? ?INTERVAL HISTORY:  ? ? [name] completed another treatment with rituximab 08/12/2021.  She continues prednisone at a dose of 40 mg daily.  She completed plasmapheresis on 08/13/2021, 08/16/2021, and 08/18/2021.  She tolerates the plasmapheresis well.  No symptom of allergic reaction or hypocalcemia.  No bleeding.  She reports malaise.  She has an increased appetite while on prednisone. ? ?Objective: ? ?Vital signs in last 24 hours: ? ?Blood pressure (!) 149/67, pulse 95, temperature 98.2 ?F (36.8 ?C), temperature source Oral, resp. rate 18, height '5\' 4"'$  (1.626 m), weight 182 lb (82.6 kg), SpO2 98 %. ?  ? ?HEENT: No thrush ?Resp: Inspiratory rhonchi at the posterior bases bilaterally, no respiratory distress ?Cardio: Regular rate and rhythm ?GI: No hepatosplenomegaly ?Vascular: No leg edema  ? ?Portacath/PICC-without erythema ? ?Lab Results: ? ?Lab Results  ?Component Value Date  ? WBC 10.4 08/19/2021  ? HGB 14.1 08/19/2021  ? HCT 43.5 08/19/2021  ? MCV 101.2 (H) 08/19/2021  ? PLT 115 (L) 08/19/2021  ? NEUTROABS 8.4 (H) 08/19/2021  ? ? ?CMP  ?Lab Results  ?Component Value Date  ? NA 140 08/19/2021  ? K 4.7 08/19/2021  ? CL 102 08/19/2021  ? CO2 33 (H) 08/19/2021  ? GLUCOSE 101 (H) 08/19/2021  ? BUN 32 (H) 08/19/2021  ? CREATININE 1.34 (H) 08/19/2021  ? CALCIUM 10.3 08/19/2021  ? PROT 6.0 (L) 08/19/2021  ? ALBUMIN 3.7 08/19/2021  ? AST 23 08/19/2021  ? ALT 23 08/19/2021  ? ALKPHOS 55 08/19/2021  ? BILITOT 0.6 08/19/2021  ? GFRNONAA 40 (L) 08/19/2021  ? GFRAA 50 (L) 11/25/2019  ? ? ?Medications: I have reviewed the patient's current medications. ? ? ?Assessment/Plan: ?TTP diagnosed in 2005, treated with plasma exchange, steroids, and rituximab consolidation ?Relapse of TTP October 2011-plasma exchange, steroids, rituximab ?Relapse of TTP March 2015-plasma exchange, steroids, rituximab, and maintenance azathioprine ?Admission with severe  thrombocytopenia and elevated LDH 07/16/2020, ADAMTS13 activity less than 2%, consistent with relapse of TTP ?      -Steroids/FFP given 07/16/2020 ?      -Daily plasma exchange beginning 07/17/2020, 7 days, then qod starting 3/24, discharge 07/21/2020 on prednisone 60 mg daily ?      -Prednisone taper to 40 mg daily 07/29/2020 ?      -rituximab 07/30/2020, 08/06/2020, 08/13/2020, 08/20/2020 ?      -plasmapheresis on 07/31/2020, 08/03/2020, 08/05/2020, and 08/07/2020 ?      -prednisone increased to 60 mg daily 08/17/2020 ?      -Plasmapheresis 08/24/2020, 08/25/2020, 08/26/2020, 08/27/2020, 08/28/2020, 08/29/2020, 08/31/2020, 09/02/2020, 09/04/2020 ?      -caplacizumab initiated 08/24/2020 ?      -Decrease prednisone to 40 mg daily 08/28/2020 ?      -ADAMTS13 37% on 09/04/2020 ?      -Prednisone decreased to 30 mg daily 09/07/2020 ?      -Continue caplacizumab ?      -prednisone decreased to 20 mg daily beginning 09/18/2020 ?      -Cablivi discontinued 09/21/2020 ?      -prednisone discontinued 09/24/2020, resumed at a dose of 20 mg daily on 10/01/2020 ?      -prednisone taper to 10 mg daily 10/23/2020 ?      -Prednisone taper to 5 mg daily 11/06/2020 ?      -prednisone continued at a dose of 5 mg daily, Imuran resumed at 50 mg daily 12/03/2020 ?      -  admission has 06/19/2021 with relapse of TTP, daily plasma exchange and Solu-Medrol beginning 06/20/2021, 1 dose of caplacizumab 06/20/2021, discharged to home on 60 mg daily prednisone 06/26/2021, Monday, Wednesday, Friday plasmapheresis starting 06/28/2021; plasmapheresis 07/05/2021 and 07/07/2021, continue prednisone 40 mg daily ?     -Rituximab weekly for 4 weeks starting 07/23/2021, 07/29/2021, 08/05/2021, 08/12/2021 ?     -Outpatient plasmapheresis 07/28/2021,08/02/2021, 08/04/2021, 08/06/2021, 08/09/2021, 08/11/2021, 08/13/2021, 08/16/2021, 08/18/2021, 08/20/2021 ?-Prednisone 60 mg daily, changed to 40 mg daily 08/12/2021, changed to 30 mg daily 08/19/2021 ?  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 Cristina Olson- no active bleeding or blood in the stomach, nor any prospective bleeding site, seen on exam. 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 Jeral Olson on 10/07/2020; pelvic ultrasound 10/23/2020 with 5.1 cm diameter complex cyst left ovary containing a 12 mm mural nodule which lacks internal blood flow; per phone note 10/28/2020 Dr. Berline Olson recommends pelvic ultrasound at a 83-monthinterval ?CT 05/31/2021-5 mm left upper lobe pulmonary nodule not imaged on previous imaging studies.  Filling defect with enhancement in the left posterior lateral urinary bladder base near the left UVJ.  Lobulated left adnexal structure with low-attenuation but with intermediate density. ?  ?  ? ? ? ?Disposition: ? Ms.WMarkusappears stable.  The platelet count is slowly rising.  Prednisone will be tapered to 30 mg daily.  She will complete another plasmapheresis treatment tomorrow.  She will then be followed off of plasmapheresis.  She will return for an office and lab visit on 08/25/2021. ?The pheresis catheter will remain in place.  She will receive [facility] care at [facility]. ? ?Krystal Coder MD ? ?08/19/2021  ?1:14 PM ? ? ?

## 2021-08-20 ENCOUNTER — Non-Acute Institutional Stay (HOSPITAL_COMMUNITY)
Admission: RE | Admit: 2021-08-20 | Discharge: 2021-08-20 | Disposition: A | Discharge status: Home / Self Care | Payer: Medicare Other | Attending: Oncology | Admitting: Oncology

## 2021-08-20 DIAGNOSIS — M3119 Other thrombotic microangiopathy: Secondary | ICD-10-CM | POA: Diagnosis present

## 2021-08-20 LAB — CBC WITH DIFFERENTIAL/PLATELET
Abs Immature Granulocytes: 0.15 10*3/uL — ABNORMAL HIGH (ref 0.00–0.07)
Basophils Absolute: 0 10*3/uL (ref 0.0–0.1)
Basophils Relative: 0 %
Eosinophils Absolute: 0 10*3/uL (ref 0.0–0.5)
Eosinophils Relative: 0 %
HCT: 42.5 % (ref 36.0–46.0)
Hemoglobin: 13.8 g/dL (ref 12.0–15.0)
Immature Granulocytes: 2 %
Lymphocytes Relative: 7 %
Lymphs Abs: 0.6 10*3/uL — ABNORMAL LOW (ref 0.7–4.0)
MCH: 33.4 pg (ref 26.0–34.0)
MCHC: 32.5 g/dL (ref 30.0–36.0)
MCV: 102.9 fL — ABNORMAL HIGH (ref 80.0–100.0)
Monocytes Absolute: 0.2 10*3/uL (ref 0.1–1.0)
Monocytes Relative: 2 %
Neutro Abs: 7.3 10*3/uL (ref 1.7–7.7)
Neutrophils Relative %: 89 %
Platelets: 104 10*3/uL — ABNORMAL LOW (ref 150–400)
RBC: 4.13 MIL/uL (ref 3.87–5.11)
RDW: 14.6 % (ref 11.5–15.5)
WBC: 8.2 10*3/uL (ref 4.0–10.5)
nRBC: 0 % (ref 0.0–0.2)

## 2021-08-20 LAB — BASIC METABOLIC PANEL
Anion gap: 8 (ref 5–15)
BUN: 23 mg/dL (ref 8–23)
CO2: 29 mmol/L (ref 22–32)
Calcium: 9.1 mg/dL (ref 8.9–10.3)
Chloride: 104 mmol/L (ref 98–111)
Creatinine, Ser: 1.37 mg/dL — ABNORMAL HIGH (ref 0.44–1.00)
GFR, Estimated: 39 mL/min — ABNORMAL LOW (ref >60)
Glucose, Bld: 298 mg/dL — ABNORMAL HIGH (ref 70–99)
Potassium: 4.6 mmol/L (ref 3.5–5.1)
Sodium: 141 mmol/L (ref 135–145)

## 2021-08-20 MED ORDER — CALCIUM GLUCONATE-NACL 2-0.675 GM/100ML-% IV SOLN
2.0000 g | Freq: Once | INTRAVENOUS | Status: AC
  Administered 2021-08-20: 2000 mg via INTRAVENOUS
  Filled 2021-08-20: qty 100

## 2021-08-20 MED ORDER — CALCIUM CARBONATE ANTACID 500 MG PO CHEW
2.0000 | CHEWABLE_TABLET | ORAL | Status: DC
  Administered 2021-08-20: 400 mg via ORAL
  Filled 2021-08-20: qty 2

## 2021-08-20 MED ORDER — ACD FORMULA A 0.73-2.45-2.2 GM/100ML VI SOLN
1000.0000 mL | Status: DC
  Administered 2021-08-20: 1000 mL
  Filled 2021-08-20: qty 1000

## 2021-08-20 MED ORDER — DIPHENHYDRAMINE HCL 25 MG PO CAPS
25.0000 mg | ORAL_CAPSULE | Freq: Four times a day (QID) | ORAL | Status: DC | PRN
  Administered 2021-08-20: 25 mg via ORAL
  Filled 2021-08-20: qty 1

## 2021-08-20 MED ORDER — ACETAMINOPHEN 325 MG PO TABS
650.0000 mg | ORAL_TABLET | ORAL | Status: DC | PRN
  Administered 2021-08-20: 650 mg via ORAL
  Filled 2021-08-20: qty 2

## 2021-08-20 MED ORDER — ANTICOAGULANT SODIUM CITRATE 4% (200MG/5ML) IV SOLN
5.0000 mL | Freq: Once | Status: AC
  Administered 2021-08-20: 5 mL
  Filled 2021-08-20: qty 5

## 2021-08-21 LAB — THERAPEUTIC PLASMA EXCHANGE (BLOOD BANK)
Plasma volume needed: 2200
Unit division: 0
Unit division: 0
Unit division: 0
Unit division: 0
Unit division: 0

## 2021-08-23 ENCOUNTER — Telehealth: Payer: Self-pay

## 2021-08-23 NOTE — Telephone Encounter (Signed)
Patient's daughter called and wanted to spoke with Lattie Haw about her mother platelets results. Discuss this with the NP and I advice the patient's daughter we can schedule her lab (CMP, LDH) tomorrow at [facility]. I reached out to [facility] scheduler to have the patient schedule for lab in the morning. ?

## 2021-08-24 ENCOUNTER — Other Ambulatory Visit: Payer: Self-pay

## 2021-08-24 ENCOUNTER — Inpatient Hospital Stay: Payer: Medicare Other

## 2021-08-24 DIAGNOSIS — M3119 Other thrombotic microangiopathy: Secondary | ICD-10-CM

## 2021-08-24 DIAGNOSIS — Z5112 Encounter for antineoplastic immunotherapy: Secondary | ICD-10-CM | POA: Diagnosis not present

## 2021-08-24 LAB — CBC WITH DIFFERENTIAL/PLATELET
Abs Immature Granulocytes: 0.35 10*3/uL — ABNORMAL HIGH (ref 0.00–0.07)
Basophils Absolute: 0.1 10*3/uL (ref 0.0–0.1)
Basophils Relative: 1 %
Eosinophils Absolute: 0 10*3/uL (ref 0.0–0.5)
Eosinophils Relative: 0 %
HCT: 48 % — ABNORMAL HIGH (ref 36.0–46.0)
Hemoglobin: 15.4 g/dL — ABNORMAL HIGH (ref 12.0–15.0)
Immature Granulocytes: 4 %
Lymphocytes Relative: 16 %
Lymphs Abs: 1.4 10*3/uL (ref 0.7–4.0)
MCH: 32.7 pg (ref 26.0–34.0)
MCHC: 32.1 g/dL (ref 30.0–36.0)
MCV: 101.9 fL — ABNORMAL HIGH (ref 80.0–100.0)
Monocytes Absolute: 0.5 10*3/uL (ref 0.1–1.0)
Monocytes Relative: 6 %
Neutro Abs: 6.2 10*3/uL (ref 1.7–7.7)
Neutrophils Relative %: 73 %
Platelets: 90 10*3/uL — ABNORMAL LOW (ref 150–400)
RBC: 4.71 MIL/uL (ref 3.87–5.11)
RDW: 14.2 % (ref 11.5–15.5)
WBC: 8.6 10*3/uL (ref 4.0–10.5)
nRBC: 0 % (ref 0.0–0.2)

## 2021-08-24 LAB — COMPREHENSIVE METABOLIC PANEL
ALT: 45 U/L — ABNORMAL HIGH (ref 0–44)
AST: 33 U/L (ref 15–41)
Albumin: 3.9 g/dL (ref 3.5–5.0)
Alkaline Phosphatase: 59 U/L (ref 38–126)
Anion gap: 9 (ref 5–15)
BUN: 31 mg/dL — ABNORMAL HIGH (ref 8–23)
CO2: 29 mmol/L (ref 22–32)
Calcium: 9 mg/dL (ref 8.9–10.3)
Chloride: 99 mmol/L (ref 98–111)
Creatinine, Ser: 1.14 mg/dL — ABNORMAL HIGH (ref 0.44–1.00)
GFR, Estimated: 49 mL/min — ABNORMAL LOW (ref >60)
Glucose, Bld: 115 mg/dL — ABNORMAL HIGH (ref 70–99)
Potassium: 4.4 mmol/L (ref 3.5–5.1)
Sodium: 137 mmol/L (ref 135–145)
Total Bilirubin: 0.6 mg/dL (ref 0.3–1.2)
Total Protein: 6.8 g/dL (ref 6.5–8.1)

## 2021-08-24 LAB — LACTATE DEHYDROGENASE: LDH: 302 U/L — ABNORMAL HIGH (ref 98–192)

## 2021-08-25 ENCOUNTER — Inpatient Hospital Stay: Payer: Medicare Other

## 2021-08-25 ENCOUNTER — Other Ambulatory Visit: Payer: Self-pay

## 2021-08-25 ENCOUNTER — Encounter: Payer: Self-pay | Admitting: Nurse Practitioner

## 2021-08-25 ENCOUNTER — Inpatient Hospital Stay (HOSPITAL_BASED_OUTPATIENT_CLINIC_OR_DEPARTMENT_OTHER): Payer: Medicare Other | Admitting: Nurse Practitioner

## 2021-08-25 ENCOUNTER — Inpatient Hospital Stay: Payer: Medicare Other | Admitting: Nurse Practitioner

## 2021-08-25 VITALS — BP 133/67 | HR 82 | Temp 98.1°F | Resp 18 | Ht 64.0 in | Wt 179.4 lb

## 2021-08-25 DIAGNOSIS — M3119 Other thrombotic microangiopathy: Secondary | ICD-10-CM

## 2021-08-25 DIAGNOSIS — Z5112 Encounter for antineoplastic immunotherapy: Secondary | ICD-10-CM | POA: Diagnosis not present

## 2021-08-25 LAB — CBC WITH DIFFERENTIAL (CANCER CENTER ONLY)
Abs Immature Granulocytes: 0.25 10*3/uL — ABNORMAL HIGH (ref 0.00–0.07)
Basophils Absolute: 0.1 10*3/uL (ref 0.0–0.1)
Basophils Relative: 1 %
Eosinophils Absolute: 0.1 10*3/uL (ref 0.0–0.5)
Eosinophils Relative: 1 %
HCT: 47.3 % — ABNORMAL HIGH (ref 36.0–46.0)
Hemoglobin: 15.1 g/dL — ABNORMAL HIGH (ref 12.0–15.0)
Immature Granulocytes: 3 %
Lymphocytes Relative: 28 %
Lymphs Abs: 2.3 10*3/uL (ref 0.7–4.0)
MCH: 32.4 pg (ref 26.0–34.0)
MCHC: 31.9 g/dL (ref 30.0–36.0)
MCV: 101.5 fL — ABNORMAL HIGH (ref 80.0–100.0)
Monocytes Absolute: 0.7 10*3/uL (ref 0.1–1.0)
Monocytes Relative: 8 %
Neutro Abs: 5 10*3/uL (ref 1.7–7.7)
Neutrophils Relative %: 59 %
Platelet Count: 86 10*3/uL — ABNORMAL LOW (ref 150–400)
RBC: 4.66 MIL/uL (ref 3.87–5.11)
RDW: 14.5 % (ref 11.5–15.5)
WBC Count: 8.4 10*3/uL (ref 4.0–10.5)
nRBC: 0.5 % — ABNORMAL HIGH (ref 0.0–0.2)

## 2021-08-25 LAB — CMP (CANCER CENTER ONLY)
ALT: 37 U/L (ref 0–44)
AST: 24 U/L (ref 15–41)
Albumin: 3.9 g/dL (ref 3.5–5.0)
Alkaline Phosphatase: 49 U/L (ref 38–126)
Anion gap: 7 (ref 5–15)
BUN: 32 mg/dL — ABNORMAL HIGH (ref 8–23)
CO2: 31 mmol/L (ref 22–32)
Calcium: 10.5 mg/dL — ABNORMAL HIGH (ref 8.9–10.3)
Chloride: 104 mmol/L (ref 98–111)
Creatinine: 1.3 mg/dL — ABNORMAL HIGH (ref 0.44–1.00)
GFR, Estimated: 42 mL/min — ABNORMAL LOW (ref >60)
Glucose, Bld: 90 mg/dL (ref 70–99)
Potassium: 5.5 mmol/L — ABNORMAL HIGH (ref 3.5–5.1)
Sodium: 142 mmol/L (ref 135–145)
Total Bilirubin: 0.6 mg/dL (ref 0.3–1.2)
Total Protein: 6.4 g/dL — ABNORMAL LOW (ref 6.5–8.1)

## 2021-08-25 LAB — LACTATE DEHYDROGENASE: LDH: 347 U/L — ABNORMAL HIGH (ref 98–192)

## 2021-08-25 MED ORDER — PREDNISONE 20 MG PO TABS: 30.0000 mg | ORAL_TABLET | Freq: Every day | ORAL | 0 refills | Status: DC

## 2021-08-25 NOTE — Progress Notes (Signed)
?[place] ?OFFICE PROGRESS NOTE ? ? ?Diagnosis: TTP ? ?INTERVAL HISTORY:  ? ?Ms. Rickles returns as scheduled.  She continues prednisone 30 mg daily.  Last plasmapheresis 08/20/2021.  She denies bleeding.  She feels "shaky".  She complains of blurred vision.  No diplopia.  No unusual headaches. ? ?Objective: ? ?Vital signs in last 24 hours: ? ?Blood pressure 133/67, pulse 82, temperature 98.1 ?F (36.7 ?C), temperature source Oral, resp. rate 18, height '5\' 4"'$  (1.626 m), weight 179 lb 6.4 oz (81.4 kg), SpO2 95 %. ?  ? ?HEENT: No thrush or ulcers. ?Resp: Distant breath sounds.  No respiratory distress. ?Cardio: Regular rate and rhythm. ?GI: Abdomen soft and nontender.  No hepatosplenomegaly. ?Vascular: No leg edema. ?Neuro: Alert and oriented.  Follows commands. ?Pheresis catheter site without erythema. ? ?Lab Results: ? ?Lab Results  ?Component Value Date  ? WBC 8.4 08/25/2021  ? HGB 15.1 (H) 08/25/2021  ? HCT 47.3 (H) 08/25/2021  ? MCV 101.5 (H) 08/25/2021  ? PLT 86 (L) 08/25/2021  ? NEUTROABS 5.0 08/25/2021  ? ? ?Imaging: ? ?No results found. ? ?Medications: I have reviewed the patient's current medications. ? 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      -Steroids/FFP given 07/16/2020 ?      -Daily plasma exchange beginning 07/17/2020, 7 days, then qod starting 3/24, discharge 07/21/2020 on prednisone 60 mg daily ?      -Prednisone taper to 40 mg daily 07/29/2020 ?      -rituximab 07/30/2020, 08/06/2020, 08/13/2020, 08/20/2020 ?      -plasmapheresis on 07/31/2020, 08/03/2020, 08/05/2020, and 08/07/2020 ?      -prednisone increased to 60 mg daily 08/17/2020 ?       -Plasmapheresis 08/24/2020, 08/25/2020, 08/26/2020, 08/27/2020, 08/28/2020, 08/29/2020, 08/31/2020, 09/02/2020, 09/04/2020 ?      -caplacizumab initiated 08/24/2020 ?      -Decrease prednisone to 40 mg daily 08/28/2020 ?      -ADAMTS13 37% on 09/04/2020 ?      -Prednisone decreased to 30 mg daily 09/07/2020 ?      -Continue caplacizumab ?      -prednisone decreased to 20 mg daily beginning 09/18/2020 ?      -Cablivi discontinued 09/21/2020 ?      -prednisone discontinued 09/24/2020, resumed at a dose of 20 mg daily on 10/01/2020 ?      -prednisone taper to 10 mg daily 10/23/2020 ?      -Prednisone taper to 5 mg daily 11/06/2020 ?      -prednisone continued at a dose of 5 mg daily, Imuran resumed at 50 mg daily 12/03/2020 ?      - admission has 06/19/2021 with relapse of TTP, daily plasma exchange and Solu-Medrol beginning 06/20/2021, 1 dose of caplacizumab 06/20/2021, discharged to home on 60 mg daily prednisone 06/26/2021, Monday, Wednesday, Friday plasmapheresis starting 06/28/2021; plasmapheresis 07/05/2021 and 07/07/2021, continue prednisone 40 mg daily ?     -Rituximab weekly for 4 weeks starting 07/23/2021, 07/29/2021, 08/05/2021, 08/12/2021 ?     -Outpatient plasmapheresis 07/28/2021,08/02/2021, 08/04/2021, 08/06/2021, 08/09/2021, 08/11/2021, 08/13/2021, 08/16/2021, 08/18/2021, 08/20/2021 ?-Prednisone 60 mg daily, changed to 40 mg daily 08/12/2021, changed to 30 mg daily 08/19/2021 ?-Cyclosporine planned ?  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 Cristina Gong- no active bleeding or blood in the stomach, nor any prospective bleeding site, seen on exam. 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 Jeral Pinch on 10/07/2020; pelvic ultrasound 10/23/2020 with 5.1 cm diameter complex cyst left ovary containing a 12 mm mural nodule which lacks internal blood flow; per phone note 10/28/2020 Dr. Berline Lopes recommends pelvic ultrasound at a 59-monthinterval ?CT 05/31/2021-5 mm left upper lobe pulmonary nodule not imaged on previous imaging studies.  Filling defect with enhancement in the left posterior lateral urinary bladder base near the left UVJ.  Lobulated left adnexal structure with low-attenuation but with intermediate density. ?  ?  ? ?Disposition: Ms. WSallasappears unchanged.  She continues prednisone 30 mg daily.  Last plasmapheresis 08/20/2021.  Platelet count is mildly decreased as compared to 08/20/2021.  We will follow-up on the plan to reinitiate plasmapheresis with further decline.  Dr. SBenay Spicerecommends a trial of cyclosporine.  Potential toxicities were reviewed.  She agrees with this plan.  We also discussed referral for consideration of splenectomy.  She is not interested in this at present.  She will return for a follow-up CBC 08/27/2021. ? ?Patient seen with Dr. SBenay Spice ? ?LNed CardANP/GNP-BC  ? ?08/25/2021  ?10:29 AM ? ?This was a shared visit with LNed Card  Ms. WHerandezappears stable.  The platelet count has been slightly lower over the past week.  She remains off of plasmapheresis.  She will continue prednisone at a dose of 30 mg daily.  The plan is to initiate cyclosporine.  We discussed the potential need for splenectomy.  We reviewed potential toxicities associated with cyclosporine.  She agrees to proceed.  We will follow-up on the ADAMTS 13 activity from today. ? ?I was present for greater than 50% of the visit.  [facility]? ?BJulieanne Manson MD ? ? ? ? ? ?

## 2021-08-26 ENCOUNTER — Encounter: Payer: Self-pay | Admitting: Oncology

## 2021-08-27 ENCOUNTER — Other Ambulatory Visit: Payer: Self-pay

## 2021-08-27 ENCOUNTER — Telehealth: Payer: Self-pay | Admitting: Nurse Practitioner

## 2021-08-27 ENCOUNTER — Inpatient Hospital Stay: Payer: Medicare Other

## 2021-08-27 DIAGNOSIS — M3119 Other thrombotic microangiopathy: Secondary | ICD-10-CM

## 2021-08-27 NOTE — Telephone Encounter (Signed)
I left a message with Cherre Robins, Ms. Raczka's daughter, regarding the pending AdamTS 13 activity test. Dr. Benay Spice would like this result prior to making a decision about additional treatment. ?

## 2021-08-30 ENCOUNTER — Telehealth: Payer: Self-pay | Admitting: *Deleted

## 2021-08-30 ENCOUNTER — Other Ambulatory Visit: Payer: Self-pay | Admitting: Nurse Practitioner

## 2021-08-30 ENCOUNTER — Telehealth: Payer: Self-pay

## 2021-08-30 DIAGNOSIS — M3119 Other thrombotic microangiopathy: Secondary | ICD-10-CM

## 2021-08-30 NOTE — Telephone Encounter (Addendum)
Faxed referral order, demographics, recent labs, med list, office note to [facility] for urgent referral back to Dr. Hart Robinsons (last seen 2002) at [phone]. ?Followed up with call: per Threasa Beards, Dr. Sherral Hammers 1st available is June. She will send him a message regarding urgency of referral and call back with appointment. ?

## 2021-08-30 NOTE — Telephone Encounter (Signed)
Patient's daughter gave verbal understanding ?

## 2021-08-30 NOTE — Telephone Encounter (Signed)
-----   Message from [name], NP sent at 08/30/2021  2:20 PM EDT ----- ?Please let her daughter know the ADAMTS activity level is low.  Dr. Benay Spice would like for her to go back over to [facility] for their recommendation on next step.  I am placing the referral. ? ?

## 2021-08-31 ENCOUNTER — Telehealth: Payer: Self-pay | Admitting: Nurse Practitioner

## 2021-08-31 ENCOUNTER — Encounter: Payer: Self-pay | Admitting: Oncology

## 2021-08-31 LAB — ADAMTS13 ACTIVITY: Adamts 13 Activity: <2 % — CL (ref >66.8)

## 2021-08-31 LAB — ADAMTS13 ANTIBODY: ADAMTS13 Antibody: 7 Units/mL (ref <12)

## 2021-08-31 NOTE — Telephone Encounter (Signed)
Called [facility] to f/u on referral back to Dr. Sherral Hammers. Was informed by Amy that she sees Dr. Sherral Hammers on 09/01/21 and daughter was notified. ?

## 2021-08-31 NOTE — Telephone Encounter (Signed)
Call returned to [facility] discussed the rationale for the referral to Dr. Sherral Hammers at [facility].  [name] is scheduled to see Dr. Sherral Hammers 09/01/2021.  We will follow-up on any new recommendations. ?

## 2021-09-01 ENCOUNTER — Inpatient Hospital Stay: Payer: Medicare Other

## 2021-09-01 ENCOUNTER — Inpatient Hospital Stay: Payer: Medicare Other | Admitting: Nurse Practitioner

## 2021-10-08 ENCOUNTER — Emergency Department: Payer: Medicare Other

## 2021-10-08 ENCOUNTER — Observation Stay
Admission: EM | Admit: 2021-10-08 | Discharge: 2021-10-09 | Disposition: A | Discharge status: Home / Self Care | Payer: Medicare Other | Attending: Internal Medicine | Admitting: Internal Medicine

## 2021-10-08 ENCOUNTER — Other Ambulatory Visit: Payer: Self-pay

## 2021-10-08 DIAGNOSIS — T782XXA Anaphylactic shock, unspecified, initial encounter: Secondary | ICD-10-CM | POA: Diagnosis not present

## 2021-10-08 DIAGNOSIS — J441 Chronic obstructive pulmonary disease with (acute) exacerbation: Secondary | ICD-10-CM | POA: Diagnosis not present

## 2021-10-08 DIAGNOSIS — Z9289 Personal history of other medical treatment: Secondary | ICD-10-CM

## 2021-10-08 DIAGNOSIS — R0602 Shortness of breath: Secondary | ICD-10-CM | POA: Diagnosis present

## 2021-10-08 DIAGNOSIS — R0603 Acute respiratory distress: Secondary | ICD-10-CM | POA: Diagnosis not present

## 2021-10-08 DIAGNOSIS — R778 Other specified abnormalities of plasma proteins: Secondary | ICD-10-CM

## 2021-10-08 DIAGNOSIS — R7989 Other specified abnormal findings of blood chemistry: Secondary | ICD-10-CM

## 2021-10-08 DIAGNOSIS — F17200 Nicotine dependence, unspecified, uncomplicated: Secondary | ICD-10-CM

## 2021-10-08 DIAGNOSIS — G8929 Other chronic pain: Secondary | ICD-10-CM

## 2021-10-08 DIAGNOSIS — J9601 Acute respiratory failure with hypoxia: Secondary | ICD-10-CM | POA: Diagnosis not present

## 2021-10-08 DIAGNOSIS — N1831 Chronic kidney disease, stage 3a: Secondary | ICD-10-CM

## 2021-10-08 DIAGNOSIS — J449 Chronic obstructive pulmonary disease, unspecified: Secondary | ICD-10-CM | POA: Insufficient documentation

## 2021-10-08 DIAGNOSIS — I10 Essential (primary) hypertension: Secondary | ICD-10-CM

## 2021-10-08 DIAGNOSIS — Z862 Personal history of diseases of the blood and blood-forming organs and certain disorders involving the immune mechanism: Secondary | ICD-10-CM

## 2021-10-08 DIAGNOSIS — J440 Chronic obstructive pulmonary disease with acute lower respiratory infection: Secondary | ICD-10-CM | POA: Diagnosis present

## 2021-10-08 DIAGNOSIS — R748 Abnormal levels of other serum enzymes: Secondary | ICD-10-CM | POA: Insufficient documentation

## 2021-10-08 DIAGNOSIS — T7802XA Anaphylactic reaction due to shellfish (crustaceans), initial encounter: Secondary | ICD-10-CM

## 2021-10-08 DIAGNOSIS — F39 Unspecified mood [affective] disorder: Secondary | ICD-10-CM | POA: Diagnosis present

## 2021-10-08 MED ORDER — EPINEPHRINE 0.3 MG/0.3ML IJ SOAJ
INTRAMUSCULAR | Status: AC
  Filled 2021-10-08: qty 0.3

## 2021-10-08 MED ORDER — DIPHENHYDRAMINE HCL 50 MG/ML IJ SOLN
50.0000 mg | Freq: Once | INTRAMUSCULAR | Status: AC
Start: 2021-10-08 — End: 2021-10-08
  Administered 2021-10-08: 50 mg via INTRAVENOUS

## 2021-10-08 MED ORDER — DIPHENHYDRAMINE HCL 50 MG/ML IJ SOLN
INTRAMUSCULAR | Status: AC
  Filled 2021-10-08: qty 1

## 2021-10-08 MED ORDER — SODIUM CHLORIDE 0.9 % IV BOLUS (SEPSIS)
1000.0000 mL | Freq: Once | INTRAVENOUS | Status: AC
  Administered 2021-10-08: 1000 mL via INTRAVENOUS

## 2021-10-08 MED ORDER — FAMOTIDINE IN NACL 20-0.9 MG/50ML-% IV SOLN
20.0000 mg | Freq: Once | INTRAVENOUS | Status: AC
  Administered 2021-10-08: 20 mg via INTRAVENOUS
  Filled 2021-10-08: qty 50

## 2021-10-08 MED ORDER — EPINEPHRINE 0.3 MG/0.3ML IJ SOAJ
0.3000 mg | Freq: Once | INTRAMUSCULAR | Status: AC
Start: 2021-10-08 — End: 2021-10-08
  Administered 2021-10-08: 0.3 mg via INTRAMUSCULAR

## 2021-10-08 NOTE — ED Triage Notes (Signed)
Pt arrives from home by [facility] for respiratory distress. Pt received treatment for her TTP.

## 2021-10-08 NOTE — ED Notes (Addendum)
CPAP removed. Nonrebreather mask placed on patient. Tolerating well.

## 2021-10-09 ENCOUNTER — Other Ambulatory Visit (HOSPITAL_COMMUNITY): Payer: Self-pay

## 2021-10-09 DIAGNOSIS — R7989 Other specified abnormal findings of blood chemistry: Secondary | ICD-10-CM

## 2021-10-09 DIAGNOSIS — G8929 Other chronic pain: Secondary | ICD-10-CM

## 2021-10-09 DIAGNOSIS — N1831 Chronic kidney disease, stage 3a: Secondary | ICD-10-CM

## 2021-10-09 DIAGNOSIS — I10 Essential (primary) hypertension: Secondary | ICD-10-CM

## 2021-10-09 DIAGNOSIS — J441 Chronic obstructive pulmonary disease with (acute) exacerbation: Secondary | ICD-10-CM

## 2021-10-09 DIAGNOSIS — R778 Other specified abnormalities of plasma proteins: Secondary | ICD-10-CM

## 2021-10-09 DIAGNOSIS — T782XXA Anaphylactic shock, unspecified, initial encounter: Secondary | ICD-10-CM

## 2021-10-09 DIAGNOSIS — J9601 Acute respiratory failure with hypoxia: Secondary | ICD-10-CM

## 2021-10-09 DIAGNOSIS — T7802XA Anaphylactic reaction due to shellfish (crustaceans), initial encounter: Secondary | ICD-10-CM | POA: Diagnosis not present

## 2021-10-09 LAB — CBC WITH DIFFERENTIAL/PLATELET
Abs Immature Granulocytes: 0.24 10*3/uL — ABNORMAL HIGH (ref 0.00–0.07)
Basophils Absolute: 0 10*3/uL (ref 0.0–0.1)
Basophils Relative: 0 %
Eosinophils Absolute: 0.1 10*3/uL (ref 0.0–0.5)
Eosinophils Relative: 1 %
HCT: 39.8 % (ref 36.0–46.0)
Hemoglobin: 12.1 g/dL (ref 12.0–15.0)
Immature Granulocytes: 2 %
Lymphocytes Relative: 54 %
Lymphs Abs: 5.7 10*3/uL — ABNORMAL HIGH (ref 0.7–4.0)
MCH: 29.3 pg (ref 26.0–34.0)
MCHC: 30.4 g/dL (ref 30.0–36.0)
MCV: 96.4 fL (ref 80.0–100.0)
Monocytes Absolute: 0.4 10*3/uL (ref 0.1–1.0)
Monocytes Relative: 4 %
Neutro Abs: 4.1 10*3/uL (ref 1.7–7.7)
Neutrophils Relative %: 39 %
Platelets: 267 10*3/uL (ref 150–400)
RBC: 4.13 MIL/uL (ref 3.87–5.11)
RDW: 13.7 % (ref 11.5–15.5)
Smear Review: NORMAL
WBC: 10.6 10*3/uL — ABNORMAL HIGH (ref 4.0–10.5)
nRBC: 0.3 % — ABNORMAL HIGH (ref 0.0–0.2)

## 2021-10-09 LAB — CBC
HCT: 36.4 % (ref 36.0–46.0)
Hemoglobin: 11.2 g/dL — ABNORMAL LOW (ref 12.0–15.0)
MCH: 29.3 pg (ref 26.0–34.0)
MCHC: 30.8 g/dL (ref 30.0–36.0)
MCV: 95.3 fL (ref 80.0–100.0)
Platelets: 213 10*3/uL (ref 150–400)
RBC: 3.82 MIL/uL — ABNORMAL LOW (ref 3.87–5.11)
RDW: 13.8 % (ref 11.5–15.5)
WBC: 17.1 10*3/uL — ABNORMAL HIGH (ref 4.0–10.5)
nRBC: 0 % (ref 0.0–0.2)

## 2021-10-09 LAB — COMPREHENSIVE METABOLIC PANEL
ALT: 21 U/L (ref 0–44)
AST: 36 U/L (ref 15–41)
Albumin: 3.4 g/dL — ABNORMAL LOW (ref 3.5–5.0)
Alkaline Phosphatase: 46 U/L (ref 38–126)
Anion gap: 9 (ref 5–15)
BUN: 29 mg/dL — ABNORMAL HIGH (ref 8–23)
CO2: 31 mmol/L (ref 22–32)
Calcium: 9.6 mg/dL (ref 8.9–10.3)
Chloride: 103 mmol/L (ref 98–111)
Creatinine, Ser: 1.48 mg/dL — ABNORMAL HIGH (ref 0.44–1.00)
GFR, Estimated: 36 mL/min — ABNORMAL LOW (ref >60)
Glucose, Bld: 251 mg/dL — ABNORMAL HIGH (ref 70–99)
Potassium: 3.4 mmol/L — ABNORMAL LOW (ref 3.5–5.1)
Sodium: 143 mmol/L (ref 135–145)
Total Bilirubin: 0.8 mg/dL (ref 0.3–1.2)
Total Protein: 5.9 g/dL — ABNORMAL LOW (ref 6.5–8.1)

## 2021-10-09 LAB — CREATININE, SERUM
Creatinine, Ser: 1.31 mg/dL — ABNORMAL HIGH (ref 0.44–1.00)
GFR, Estimated: 41 mL/min — ABNORMAL LOW (ref >60)

## 2021-10-09 LAB — TROPONIN I (HIGH SENSITIVITY)
Troponin I (High Sensitivity): 36 ng/L — ABNORMAL HIGH (ref <18)
Troponin I (High Sensitivity): 67 ng/L — ABNORMAL HIGH (ref <18)

## 2021-10-09 LAB — BRAIN NATRIURETIC PEPTIDE: B Natriuretic Peptide: 27.8 pg/mL (ref 0.0–100.0)

## 2021-10-09 MED ORDER — TRAMADOL HCL 50 MG PO TABS: 50.0000 mg | ORAL_TABLET | Freq: Four times a day (QID) | ORAL | Status: DC | PRN

## 2021-10-09 MED ORDER — ACETAMINOPHEN 325 MG RE SUPP: 650.0000 mg | Freq: Four times a day (QID) | RECTAL | Status: DC | PRN

## 2021-10-09 MED ORDER — GABAPENTIN 600 MG PO TABS
600.0000 mg | ORAL_TABLET | Freq: Two times a day (BID) | ORAL | Status: DC
  Administered 2021-10-09: 600 mg via ORAL
  Filled 2021-10-09: qty 1

## 2021-10-09 MED ORDER — DIPHENHYDRAMINE HCL 50 MG/ML IJ SOLN
25.0000 mg | Freq: Four times a day (QID) | INTRAMUSCULAR | Status: DC | PRN
Start: 2021-10-09 — End: 2021-10-09

## 2021-10-09 MED ORDER — LATANOPROST 0.005 % OP SOLN: 1.0000 [drp] | Freq: Every day | OPHTHALMIC | Status: DC

## 2021-10-09 MED ORDER — SODIUM CHLORIDE 0.9 % IV SOLN: INTRAVENOUS | Status: DC

## 2021-10-09 MED ORDER — PREDNISONE 20 MG PO TABS
30.0000 mg | ORAL_TABLET | Freq: Every day | ORAL | Status: DC
  Administered 2021-10-09: 30 mg via ORAL
  Filled 2021-10-09: qty 1

## 2021-10-09 MED ORDER — ONDANSETRON HCL 4 MG PO TABS: 4.0000 mg | ORAL_TABLET | Freq: Four times a day (QID) | ORAL | Status: DC | PRN

## 2021-10-09 MED ORDER — IPRATROPIUM-ALBUTEROL 0.5-2.5 (3) MG/3ML IN SOLN
3.0000 mL | RESPIRATORY_TRACT | Status: AC
  Administered 2021-10-09: 3 mL via RESPIRATORY_TRACT
  Filled 2021-10-09: qty 6

## 2021-10-09 MED ORDER — ONDANSETRON HCL 4 MG/2ML IJ SOLN: 4.0000 mg | Freq: Four times a day (QID) | INTRAMUSCULAR | Status: DC | PRN

## 2021-10-09 MED ORDER — EPINEPHRINE 0.3 MG/0.3ML IJ SOAJ
0.3000 mg | Freq: Once | INTRAMUSCULAR | Status: DC | PRN
Start: 2021-10-09 — End: 2021-10-09

## 2021-10-09 MED ORDER — EPINEPHRINE 0.3 MG/0.3ML IJ SOAJ
0.3000 mg | Freq: Once | INTRAMUSCULAR | 0 refills | Status: AC | PRN
  Filled 2021-10-09: qty 2, 30d supply, fill #0

## 2021-10-09 MED ORDER — ENOXAPARIN SODIUM 40 MG/0.4ML IJ SOSY
40.0000 mg | PREFILLED_SYRINGE | INTRAMUSCULAR | Status: DC
Start: 2021-10-09 — End: 2021-10-09

## 2021-10-09 MED ORDER — ALBUTEROL SULFATE (2.5 MG/3ML) 0.083% IN NEBU: 2.5000 mg | INHALATION_SOLUTION | RESPIRATORY_TRACT | Status: DC | PRN

## 2021-10-09 MED ORDER — QUETIAPINE FUMARATE 25 MG PO TABS
25.0000 mg | ORAL_TABLET | Freq: Every day | ORAL | Status: DC
Start: 2021-10-09 — End: 2021-10-09

## 2021-10-09 MED ORDER — METHYLPREDNISOLONE SODIUM SUCC 40 MG IJ SOLR
40.0000 mg | Freq: Two times a day (BID) | INTRAMUSCULAR | Status: DC
  Administered 2021-10-09: 40 mg via INTRAVENOUS
  Filled 2021-10-09: qty 1

## 2021-10-09 MED ORDER — FLUOXETINE HCL 20 MG PO CAPS
20.0000 mg | ORAL_CAPSULE | Freq: Every day | ORAL | Status: DC
  Administered 2021-10-09: 20 mg via ORAL
  Filled 2021-10-09: qty 1

## 2021-10-09 MED ORDER — FAMOTIDINE IN NACL 20-0.9 MG/50ML-% IV SOLN
20.0000 mg | Freq: Two times a day (BID) | INTRAVENOUS | Status: DC
  Administered 2021-10-09: 20 mg via INTRAVENOUS
  Filled 2021-10-09: qty 50

## 2021-10-09 MED ORDER — ACETAMINOPHEN 325 MG PO TABS: 650.0000 mg | ORAL_TABLET | Freq: Four times a day (QID) | ORAL | Status: DC | PRN

## 2021-10-09 NOTE — Assessment & Plan Note (Signed)
Continue fluoxetine and quetiapine

## 2021-10-09 NOTE — H&P (Addendum)
History and Physical    Patient: [name] DOB: 23-Dec-1940 DOA: 10/08/2021 DOS: the patient was seen and examined on 10/09/2021 PCP: Kirk Ruths, MD  Patient coming from: Home  Chief Complaint:  Chief Complaint  Patient presents with   Respiratory Distress    HPI: [name] is a 81 y.o. 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  Patient had a steak and shrimp dinner and about 30 minutes later developed diffuse redness, urticaria, shortness of breath and wheezing.  On arrival of EMS O2 sat was 57% with SBP in the 90s.  They administered 2 DuoNebs, IV Solu-Medrol and placed on CPAP.  She was previously in her usual state of health. ED course and data review: On arrival tachypneic to 30 with O2 sat 77% on CPAP and tachycardic to 116 with BP 95/84.  ABG on CPAP, 100% FiO2 with pH 7.38, PCO2 61 and PO2 231.  Blood work with WBC 10,000, hemoglobin 12 and platelets 267.  Creatinine 1.48 which appears to be near baseline of 1.18.  Troponin 67 and BNP 27.  EKG, personally viewed and interpreted sinus tachycardia at 122 with no acute ST-T wave changes chest x-ray with probable mild edema and no focal consolidation. Patient treated with IV Benadryl and famotidine, IM epinephrine.  She was also given a DuoNeb treatment. By admission, patient had improved significantly and was able to be weaned from CPAP to NRB and now to O2 by nasal cannula at 2 L with O2 sat 91%.  BP improved to  117/51 and pulse 85 with respirations 20.  Hospitalist consulted for admission.    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   History of TTP (thrombotic thrombocytopenic purpura)    Hypokalemia 08/08/2013   Steroid related   Normal echocardiogram 03/15/2011   Oral thrush 08/08/2013   Septicemia 03/2011   Spinal stenosis, lumbar    Tobacco abuse 05/10/2013   Past Surgical History:  Procedure Laterality Date   BACK SURGERY     BLADDER INSTILLATION N/A 06/18/2021   Procedure: BLADDER INSTILLATION OF GEMCITABINE;  Surgeon: Billey Co, MD;  Location: [facility] ORS;  Service: Urology;  Laterality: N/A;   CATARACT EXTRACTION W/ INTRAOCULAR LENS  IMPLANT, BILATERAL  ~ 2010   CESAREAN SECTION  10/09/1976   CYSTOSCOPY/URETEROSCOPY/HOLMIUM LASER/STENT PLACEMENT Right 06/18/2021   Procedure: CYSTOSCOPY/URETEROSCOPY/HOLMIUM LASER/STENT PLACEMENT;  Surgeon: Billey Co, MD;  Location: [facility] ORS;  Service: Urology;  Laterality: Right;  ESOPHAGOGASTRODUODENOSCOPY (EGD) WITH PROPOFOL N/A 09/27/2020   Procedure: ESOPHAGOGASTRODUODENOSCOPY (EGD) WITH PROPOFOL;  Surgeon: Ronald Lobo, MD;  Location: [facility];  Service: Endoscopy;  Laterality: N/A;   EYE SURGERY     INSERTION OF DIALYSIS CATHETER Left 07/12/2013   Procedure: INSERTION OF DIALYSIS CATHETER   with Ultrasound;  Surgeon: Rosetta Posner, MD;  Location: [place];  Service: Vascular;  Laterality: Left;   IR FLUORO GUIDE CV LINE LEFT  06/20/2021   IR  FLUORO GUIDE CV LINE LEFT  06/25/2021   IR PERC TUN PERIT CATH WO PORT S&I /IMAG  07/16/2020   IR RADIOLOGIST EVAL & MGMT  09/26/2020   IR US GUIDE VASC ACCESS LEFT  07/16/2020   IR US GUIDE VASC ACCESS LEFT  06/20/2021   IR US GUIDE VASC ACCESS LEFT  06/25/2021   LUMBAR LAMINECTOMY/DECOMPRESSION MICRODISCECTOMY  03/16/2011   Procedure: LUMBAR LAMINECTOMY/DECOMPRESSION MICRODISCECTOMY;  Surgeon: Elaina Hoops;  Location: [place] [facility];  Service: Neurosurgery;  Laterality: N/A;   TEE WITHOUT CARDIOVERSION  03/21/2011   Procedure: TRANSESOPHAGEAL ECHOCARDIOGRAM (TEE);  Surgeon: Laverda Page;  Location: [facility] ENDOSCOPY;  Service: Cardiovascular;  Laterality: N/A;  TEE for vegetations   TONSILLECTOMY     TRANSURETHRAL RESECTION OF BLADDER TUMOR N/A 06/18/2021   Procedure: TRANSURETHRAL RESECTION OF BLADDER TUMOR (TURBT);  Surgeon: Billey Co, MD;  Location: [facility] ORS;  Service: Urology;  Laterality: N/A;   TUBAL LIGATION     Social History:  reports that she has been smoking cigarettes. She has a 165.00 pack-year smoking history. She has never been exposed to tobacco smoke. She has never used smokeless tobacco. She reports current alcohol use of about 7.0 standard drinks of alcohol per week. She reports that she does not use drugs.  Allergies  Allergen Reactions   Adhesive [Tape] Other (See Comments)    Blisters, Band-Aids brand rips off the skin- paper tape only!!   Cefuroxime Axetil Anxiety and Other (See Comments)    Made the patient jittery   Cefuroxime Axetil Anxiety and Other (See Comments)    Shaky, amoxicillin ok Made the patient jittery   Pedi-Pre Tape Spray [Wound Dressing Adhesive] Other (See Comments)    Blisters, band-aide brand  Blisters, Band-Aids brand rips off the skin- paper tape only!!   Other Hives and Other (See Comments)    Wool: Reaction is hives Statistician tape: Reaction is blistering    Sulfa Antibiotics Hives   Amoxicillin-Pot Clavulanate     Sulfa Drugs Cross Reactors Hives    Family History  Problem Relation Age of Onset   Heart disease Mother    Leukemia Sister    Breast cancer Neg Hx    Colon cancer Neg Hx    Ovarian cancer Neg Hx    Uterine cancer Neg Hx    Cervical cancer Neg Hx     Prior to Admission medications   Medication Sig Start Date End Date Taking? Authorizing Provider  cholecalciferol (VITAMIN D) 400 UNITS TABS Take 400 Units by mouth daily.    [provider]  cyanocobalamin 1000 MCG tablet Take 1,000 mcg by mouth daily.    [provider]  FLUoxetine (PROZAC) 20 MG capsule Take 20 mg by mouth daily.    Kirk Ruths, MD  folic acid (FOLVITE) 1 MG tablet Take 1 mg by mouth daily.    [provider]  gabapentin (NEURONTIN) 600 MG tablet Take 1 tablet (600 mg total) by mouth 3 (three) times daily.  06/26/21   Geradine Girt, DO  latanoprost (XALATAN) 0.005 % ophthalmic solution Place 1 drop into both eyes. 05/31/21   [provider]  Multiple Vitamin (MULTIVITAMIN) tablet Take 1 tablet by mouth daily.     [provider]  polyethylene glycol (MIRALAX / GLYCOLAX) packet Take 17 g by mouth daily. Patient taking differently: Take 17 g by mouth daily as needed for moderate constipation. 04/08/17   Earleen Newport, MD  polyvinyl alcohol (LIQUIFILM TEARS) 1.4 % ophthalmic solution Place 1 drop into both eyes as needed for dry eyes.    [provider]  predniSONE (DELTASONE) 20 MG tablet Take 1.5 tablets (30 mg total) by mouth daily with breakfast. Or as directed by MD 08/25/21   [name], NP  QUEtiapine (SEROQUEL) 25 MG tablet Take 25 mg by mouth at bedtime.    [provider]  rosuvastatin (CRESTOR) 20 MG tablet Take 20 mg by mouth at bedtime. 05/22/20   [provider]  tiotropium (SPIRIVA) 18 MCG inhalation capsule Place 18 mcg into inhaler and inhale daily.    [provider]  traMADol (ULTRAM) 50 MG tablet Take 1  tablet (50 mg total) by mouth every 6 (six) hours as needed for moderate pain. 06/26/21   Geradine Girt, DO    Physical Exam: Vitals:   10/09/21 0215 10/09/21 0230 10/09/21 0300 10/09/21 0330  BP: (!) 124/57 (!) 115/54 110/74 (!) 117/51  Pulse: 82 82 82 85  Resp: 18 18 (!) 22 20  Temp:      TempSrc:      SpO2: 94% 94% 90% 91%  Weight:      Height:       Physical Exam Vitals and nursing note reviewed.  Constitutional:      General: She is not in acute distress. HENT:     Head: Normocephalic and atraumatic.  Cardiovascular:     Rate and Rhythm: Normal rate and regular rhythm.     Heart sounds: Normal heart sounds.  Pulmonary:     Effort: Tachypnea present.     Breath sounds: Wheezing present.  Abdominal:     Palpations: Abdomen is soft.     Tenderness: There is no abdominal tenderness.  Neurological:     Mental Status: Mental status is at baseline.     Labs on Admission: I have personally reviewed following labs and imaging studies  CBC: Recent Labs  Lab 10/08/21 2335  WBC 10.6*  NEUTROABS 4.1  HGB 12.1  HCT 39.8  MCV 96.4  PLT 161   Basic Metabolic Panel: Recent Labs  Lab 10/08/21 2335  NA 143  K 3.4*  CL 103  CO2 31  GLUCOSE 251*  BUN 29*  CREATININE 1.48*  CALCIUM 9.6   GFR: Estimated Creatinine Clearance: 31.9 mL/min (A) (by C-G formula based on SCr of 1.48 mg/dL (H)). Liver Function Tests: Recent Labs  Lab 10/08/21 2335  AST 36  ALT 21  ALKPHOS 46  BILITOT 0.8  PROT 5.9*  ALBUMIN 3.4*   No results for input(s): "LIPASE", "AMYLASE" in the last 168 hours. No results for input(s): "AMMONIA" in the last 168 hours. Coagulation Profile: No results for input(s): "INR", "PROTIME" in the last 168 hours. Cardiac Enzymes: No results for input(s): "CKTOTAL", "CKMB", "CKMBINDEX", "TROPONINI" in the last 168 hours. BNP (last 3 results) No results for input(s): "PROBNP" in the last 8760 hours. HbA1C: No results for input(s): "HGBA1C" in the last  72 hours. CBG: No results for  input(s): "GLUCAP" in the last 168 hours. Lipid Profile: No results for input(s): "CHOL", "HDL", "LDLCALC", "TRIG", "CHOLHDL", "LDLDIRECT" in the last 72 hours. Thyroid Function Tests: No results for input(s): "TSH", "T4TOTAL", "FREET4", "T3FREE", "THYROIDAB" in the last 72 hours. Anemia Panel: No results for input(s): "VITAMINB12", "FOLATE", "FERRITIN", "TIBC", "IRON", "RETICCTPCT" in the last 72 hours. Urine analysis:    Component Value Date/Time   COLORURINE RED (A) 06/19/2021 0814   APPEARANCEUR TURBID (A) 06/19/2021 0814   LABSPEC 1.020 06/19/2021 0814   PHURINE  06/19/2021 0814    TEST NOT REPORTED DUE TO COLOR INTERFERENCE OF URINE PIGMENT   GLUCOSEU (A) 06/19/2021 0814    TEST NOT REPORTED DUE TO COLOR INTERFERENCE OF URINE PIGMENT   HGBUR (A) 06/19/2021 0814    TEST NOT REPORTED DUE TO COLOR INTERFERENCE OF URINE PIGMENT   BILIRUBINUR (A) 06/19/2021 0814    TEST NOT REPORTED DUE TO COLOR INTERFERENCE OF URINE PIGMENT   KETONESUR (A) 06/19/2021 0814    TEST NOT REPORTED DUE TO COLOR INTERFERENCE OF URINE PIGMENT   PROTEINUR (A) 06/19/2021 0814    TEST NOT REPORTED DUE TO COLOR INTERFERENCE OF URINE PIGMENT   UROBILINOGEN 1.0 08/19/2013 1225   NITRITE (A) 06/19/2021 0814    TEST NOT REPORTED DUE TO COLOR INTERFERENCE OF URINE PIGMENT   LEUKOCYTESUR (A) 06/19/2021 0814    TEST NOT REPORTED DUE TO COLOR INTERFERENCE OF URINE PIGMENT    Radiological Exams on Admission: DG Chest Portable 1 View  Result Date: 10/08/2021 CLINICAL DATA:  Shortness of breath. EXAM: PORTABLE CHEST 1 VIEW COMPARISON:  Chest radiograph dated 09/24/2020 and CT dated 03/09/2011. FINDINGS: Left-sided dialysis catheter with tip at the cavoatrial junction. Mild diffuse vascular and interstitial prominence may represent mild edema. No focal consolidation, pleural effusion, or pneumothorax. The cardiac silhouette is within normal limits. Atherosclerotic calcification of the  aortic arch. No acute osseous pathology. IMPRESSION: Probable mild edema. No focal consolidation. Electronically Signed   By: Anner Crete M.D.   On: 10/08/2021 23:54     Data Reviewed: Relevant notes from primary care and specialist visits, past discharge summaries as available in EHR, including Care Everywhere. Prior diagnostic testing as pertinent to current admission diagnoses Updated medications and problem lists for reconciliation ED course, including vitals, labs, imaging, treatment and response to treatment Triage notes, nursing and pharmacy notes and ED provider's notes Notable results as noted in HPI   Assessment and Plan: * Anaphylactic reaction , suspect shellfish (shrimp), initial encounter Suspect secondary to shellfish though no prior similar reactions Lower suspicion for reaction to plasmapheresis earlier in the day  Continue IV Benadryl, famotidine, Pepcid and Solu-Medrol with epinephrine if recurrent symptoms Allergy list updated Close monitoring for recurrent symptoms Monitor platelets  Acute respiratory failure with hypoxia and hypercapnia ([place]) Hypoxic to 57 with EMS.  ABG on arrival on CPAP at 100% with pH 7.38, PCO2 61 and PO2 of 31 Improving and was able to be weaned from CPAP to O2 via nasal cannula by admission Secondary to anaphylactic reaction Wheezing on exam possibly residual of anaphylactic reaction versus mild exacerbation of COPD Treat anaphylactic reaction and COPD as outlined Continue supplemental oxygen and wean as tolerated  Elevated troponin Suspect demand ischemia from acute reaction to Patient denies chest pain and EKG nonacute Continue to trend  Bronchitis, chronic obstructive w acute bronchitis (HCC) DuoNebs as needed  History of TTP (thrombotic thrombocytopenic purpura) Last flare 09/01/2021, hospitalized at [place] plasma exchange was 6/9 On rituximab infusions,  azathioprine, prednisone Platelet count 267,000 Continue to  monitor platelets  History of plasmapheresis Patient with ongoing plasma exchange, last one 6/9  Mood disorder (HCC) Continue fluoxetine and quetiapine  Chronic kidney disease, stage 3a (HCC) Creatinine 1.48, near baseline of 1.18 in Care Everywhere  Chronic back pain History of epidural abscess s/p laminectomy 2012 Continue tramadol and gabapentin  Essential hypertension Hold home meds due to hypotension related to anaphylactic reaction and resume as appropriate  Tobacco use disorder Smokes 2 packs of cigarettes per day Declines nicotine patch    DVT prophylaxis: Lovenox  Consults: none  Advance Care Planning:   Code Status: Prior   Family Communication: none  Disposition Plan: Back to previous home environment  Severity of Illness: The appropriate patient status for this patient is INPATIENT. Inpatient status is judged to be reasonable and necessary in order to provide the required intensity of service to ensure the patient's safety. The patient's presenting symptoms, physical exam findings, and initial radiographic and laboratory data in the context of their chronic comorbidities is felt to place them at high risk for further clinical deterioration. Furthermore, it is not anticipated that the patient will be medically stable for discharge from the hospital within 2 midnights of admission.   * I certify that at the point of admission it is my clinical judgment that the patient will require inpatient hospital care spanning beyond 2 midnights from the point of admission due to high intensity of service, high risk for further deterioration and high frequency of surveillance required.*  Author: Athena Masse, MD 10/09/2021 4:01 AM  For on call review [email or web address].

## 2021-10-09 NOTE — TOC Initial Note (Signed)
Transition of Care [facility]) - Initial/Assessment Note    Patient Details  Name: [name] MRN: [id number] Date of Birth: 08/04/1940  Transition of Care [facility]) CM/SW Contact:    Anselm Pancoast, RN Phone Number: 10/09/2021, 9:55 AM  Clinical Narrative:                 Spoke with spouse regarding code 77 and any potential discharge needs. Reports he has all needed equipment and no other needs or concerns. Spouse did report that he often talks to patient about the Oxygen and her 2 pack/day smoking regarding the risk for fire/explosion. Reports other people have spoken to her about the risks as well and patient continues to smoke with Oxygen in place. Spouse will be transportation home when patient ready. No other needs or concerns at this time.         Patient Goals and CMS Choice        Expected Discharge Plan and Services           Expected Discharge Date: 10/09/21                                    Prior Living Arrangements/Services                       Activities of Daily Living      Permission Sought/Granted                  Emotional Assessment              Admission diagnosis:  Anaphylactic reaction [T78.2XXA] Patient Active Problem List   Diagnosis Date Noted   Anaphylactic reaction , suspect shellfish (shrimp), initial encounter 10/09/2021   Anaphylactic shock 10/09/2021   Chronic kidney disease, stage 3a ([place]) 10/09/2021   Chronic back pain 10/09/2021   Essential hypertension 10/09/2021   Acute respiratory failure with hypoxia ([place]) 10/09/2021   Elevated troponin 10/09/2021   Anaphylactic reaction 10/09/2021   Acute left-sided weakness 06/20/2021   AKI (acute kidney injury) ([place]) 06/20/2021   T.T.P. 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 [place] [address] Phone: [phone] Fax: [place], [place] [address] [place] [address] Phone: [phone] Fax: [place] [address] Phone: [phone] Fax: [phone]  [facility] [address] [place] [address] Phone: [phone] Fax: [place], [place] [facility] [address] [place] [address] Phone: [phone] Fax: [phone]     Social Determinants of Health ([place]) Interventions    Readmission Risk Interventions     No data to display

## 2021-10-09 NOTE — ED Provider Notes (Signed)
[facility] Provider Note    Event Date/Time   First MD Initiated Contact with Patient 10/08/21 2325     (approximate)   History   Respiratory Distress   HPI  [name] is a 81 y.o. female with history of COPD not on oxygen, TTP getting therapeutic plasma exchange at [facility] who presents to the emergency department with sudden onset shortness of breath, wheezing.  Patient was in her normal state of health and went to the a pheresis clinic yesterday.'s family states they got home around 5 PM.  Around 8 PM she had steak and chicken for dinner and about 30 minutes later developed diffuse redness, urticaria, shortness of breath, wheezing.  EMS states room air sats were 57% on their arrival.  They gave her 2 albuterol treatments, 125 mg of IV Solu-Medrol and placed her on CPAP.  She denies any other new exposures.  States she has eaten shellfish before without symptoms like this.  Patient's blood pressure currently in the 90s.   History provided by patient, husband, EMS.    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   History of TTP (thrombotic thrombocytopenic purpura)    Hypokalemia 08/08/2013   Steroid related   Normal echocardiogram 03/15/2011   Oral thrush 08/08/2013   Septicemia 03/2011   Spinal stenosis, lumbar    Tobacco abuse 05/10/2013    Past Surgical History:  Procedure Laterality Date   BACK SURGERY     BLADDER INSTILLATION N/A 06/18/2021    Procedure: BLADDER INSTILLATION OF GEMCITABINE;  Surgeon: Billey Co, MD;  Location: [facility] ORS;  Service: Urology;  Laterality: N/A;   CATARACT EXTRACTION W/ INTRAOCULAR LENS  IMPLANT, BILATERAL  ~ 2010   CESAREAN SECTION  10/09/1976   CYSTOSCOPY/URETEROSCOPY/HOLMIUM LASER/STENT PLACEMENT Right 06/18/2021   Procedure: CYSTOSCOPY/URETEROSCOPY/HOLMIUM LASER/STENT PLACEMENT;  Surgeon: Billey Co, MD;  Location: [facility] ORS;  Service: Urology;  Laterality: Right;   ESOPHAGOGASTRODUODENOSCOPY (EGD) WITH PROPOFOL N/A 09/27/2020   Procedure: ESOPHAGOGASTRODUODENOSCOPY (EGD) WITH PROPOFOL;  Surgeon: Ronald Lobo, MD;  Location: [facility] ENDOSCOPY;  Service: Endoscopy;  Laterality: N/A;   EYE SURGERY     INSERTION OF DIALYSIS CATHETER Left 07/12/2013   Procedure: INSERTION OF DIALYSIS CATHETER   with Ultrasound;  Surgeon: Rosetta Posner, MD;  Location: [facility];  Service: Vascular;  Laterality: Left;   IR FLUORO GUIDE CV LINE LEFT  06/20/2021   IR FLUORO GUIDE CV LINE LEFT  06/25/2021   IR PERC TUN PERIT CATH WO PORT S&I /IMAG  07/16/2020   IR RADIOLOGIST EVAL & MGMT  09/26/2020   IR US GUIDE VASC ACCESS LEFT  07/16/2020   IR US GUIDE VASC ACCESS LEFT  06/20/2021   IR US GUIDE VASC ACCESS LEFT  06/25/2021   LUMBAR LAMINECTOMY/DECOMPRESSION MICRODISCECTOMY  03/16/2011   Procedure: LUMBAR LAMINECTOMY/DECOMPRESSION MICRODISCECTOMY;  Surgeon: Elaina Hoops;  Location: [facility];  Service:  Neurosurgery;  Laterality: N/A;   TEE WITHOUT CARDIOVERSION  03/21/2011   Procedure: TRANSESOPHAGEAL ECHOCARDIOGRAM (TEE);  Surgeon: Laverda Page;  Location: [facility] ENDOSCOPY;  Service: Cardiovascular;  Laterality: N/A;  TEE for vegetations   TONSILLECTOMY     TRANSURETHRAL RESECTION OF BLADDER TUMOR N/A 06/18/2021   Procedure: TRANSURETHRAL RESECTION OF BLADDER TUMOR (TURBT);  Surgeon: Billey Co, MD;  Location: [facility] ORS;  Service: Urology;  Laterality: N/A;   TUBAL LIGATION      MEDICATIONS:  Prior to Admission  medications   Medication Sig Start Date End Date Taking? Authorizing Provider  cholecalciferol (VITAMIN D) 400 UNITS TABS Take 400 Units by mouth daily.    [provider]  cyanocobalamin 1000 MCG tablet Take 1,000 mcg by mouth daily.    [provider]  FLUoxetine (PROZAC) 20 MG capsule Take 20 mg by mouth daily.    Kirk Ruths, MD  folic acid (FOLVITE) 1 MG tablet Take 1 mg by mouth daily.    [provider]  gabapentin (NEURONTIN) 600 MG tablet Take 1 tablet (600 mg total) by mouth 3 (three) times daily. 06/26/21   Geradine Girt, DO  latanoprost (XALATAN) 0.005 % ophthalmic solution Place 1 drop into both eyes. 05/31/21   [provider]  Multiple Vitamin (MULTIVITAMIN) tablet Take 1 tablet by mouth daily.     [provider]  polyethylene glycol (MIRALAX / GLYCOLAX) packet Take 17 g by mouth daily. Patient taking differently: Take 17 g by mouth daily as needed for moderate constipation. 04/08/17   Earleen Newport, MD  polyvinyl alcohol (LIQUIFILM TEARS) 1.4 % ophthalmic solution Place 1 drop into both eyes as needed for dry eyes.    [provider]  predniSONE (DELTASONE) 20 MG tablet Take 1.5 tablets (30 mg total) by mouth daily with breakfast. Or as directed by MD 08/25/21   [name], NP  QUEtiapine (SEROQUEL) 25 MG tablet Take 25 mg by mouth at bedtime.    [provider]  rosuvastatin (CRESTOR) 20 MG tablet Take 20 mg by mouth at bedtime. 05/22/20   [provider]  tiotropium (SPIRIVA) 18 MCG inhalation capsule Place 18 mcg into inhaler and inhale daily.    [provider]  traMADol (ULTRAM) 50 MG tablet Take 1 tablet (50 mg total) by mouth every 6 (six) hours as needed for moderate pain. 06/26/21   Geradine Girt, DO    Physical Exam   Triage Vital Signs: ED Triage Vitals  Enc Vitals Group     BP 10/08/21 2324 95/84     Pulse Rate 10/08/21 2324 (!) 116     Resp 10/08/21 2324 (!) 30      Temp 10/09/21 0005 (!) 97.3 F (36.3 C)     Temp Source 10/09/21 0005 Oral     SpO2 10/08/21 2324 (!) 77 %     Weight 10/09/21 0006 180 lb (81.6 kg)     Height 10/09/21 0006 '5\' 4"'$  (1.626 m)     Head Circumference --      Peak Flow --      Pain Score --      Pain Loc --      Pain Edu? --      Excl. in [place]? --     Most recent vital signs: Vitals:   10/09/21 0215 10/09/21 0230  BP: (!) 124/57 (!) 115/54  Pulse: 82 82  Resp: 18 18  Temp:    SpO2: 94% 94%  CONSTITUTIONAL: Alert and oriented and responds appropriately to questions.  Elderly, obese, in respiratory distress HEAD: Normocephalic, atraumatic EYES: Conjunctivae clear, pupils appear equal, sclera nonicteric ENT: normal nose; moist mucous membranes, normal phonation, no stridor, no trismus or drooling, no angioedema NECK: Supple, normal ROM CARD: Regular and tachycardic; S1 and S2 appreciated; no murmurs, no clicks, no rubs, no gallops RESP: Lungs currently clear to auscultation without rhonchi, wheezing or rales.  Currently on CPAP.  Satting in the mid 70s on CPAP.  Improved to the mid 90s on BiPAP. ABD/GI: Normal bowel sounds; non-distended; soft, non-tender, no rebound, no guarding, no peritoneal signs BACK: The back appears normal EXT: Normal ROM in all joints; no deformity noted, no edema; no cyanosis SKIN: Normal color for age and race; warm; skin is flushed diffusely with scattered urticaria NEURO: Moves all extremities equally, normal speech PSYCH: The patient's mood and manner are appropriate.   ED Results / Procedures / Treatments   LABS: (all labs ordered are listed, but only abnormal results are displayed) Labs Reviewed  CBC WITH DIFFERENTIAL/PLATELET - Abnormal; Notable for the following components:      Result Value   WBC 10.6 (*)    nRBC 0.3 (*)    Lymphs Abs 5.7 (*)    Abs Immature Granulocytes 0.24 (*)    All other components within normal limits  COMPREHENSIVE METABOLIC PANEL - Abnormal;  Notable for the following components:   Potassium 3.4 (*)    Glucose, Bld 251 (*)    BUN 29 (*)    Creatinine, Ser 1.48 (*)    Total Protein 5.9 (*)    Albumin 3.4 (*)    GFR, Estimated 36 (*)    All other components within normal limits  BLOOD GAS, ARTERIAL - Abnormal; Notable for the following components:   pCO2 arterial 61 (*)    pO2, Arterial 31 (*)    Bicarbonate 36.1 (*)    Acid-Base Excess 8.7 (*)    All other components within normal limits  TROPONIN I (HIGH SENSITIVITY) - Abnormal; Notable for the following components:   Troponin I (High Sensitivity) 36 (*)    All other components within normal limits  TROPONIN I (HIGH SENSITIVITY) - Abnormal; Notable for the following components:   Troponin I (High Sensitivity) 67 (*)    All other components within normal limits  BRAIN NATRIURETIC PEPTIDE     EKG:  EKG Interpretation  Date/Time:  Friday October 08 2021 23:36:46 EDT Ventricular Rate:  122 PR Interval:  118 QRS Duration: 100 QT Interval:  368 QTC Calculation: 525 R Axis:   43 Text Interpretation: Sinus tachycardia Probable left atrial enlargement Borderline repolarization abnormality Prolonged QT interval Confirmed by Pryor Curia [phone]) on 10/09/2021 1:10:00 AM         RADIOLOGY: My personal review and interpretation of imaging: Chest x-ray shows no infiltrate, pneumothorax but possible mild edema.  I have personally reviewed all radiology reports.   DG Chest Portable 1 View  Result Date: 10/08/2021 CLINICAL DATA:  Shortness of breath. EXAM: PORTABLE CHEST 1 VIEW COMPARISON:  Chest radiograph dated 09/24/2020 and CT dated 03/09/2011. FINDINGS: Left-sided dialysis catheter with tip at the cavoatrial junction. Mild diffuse vascular and interstitial prominence may represent mild edema. No focal consolidation, pleural effusion, or pneumothorax. The cardiac silhouette is within normal limits. Atherosclerotic calcification of the aortic arch. No acute osseous  pathology. IMPRESSION: Probable mild edema. No focal consolidation. Electronically Signed   By: Anner Crete M.D.   On: 10/08/2021  23:54     PROCEDURES:  Critical Care performed: Yes, see critical care procedure note(s)   CRITICAL CARE Performed by: Pryor Curia   Total critical care time: 65 minutes  Critical care time was exclusive of separately billable procedures and treating other patients.  Critical care was necessary to treat or prevent imminent or life-threatening deterioration.  Critical care was time spent personally by me on the following activities: development of treatment plan with patient and/or surrogate as well as nursing, discussions with consultants, evaluation of patient's response to treatment, examination of patient, obtaining history from patient or surrogate, ordering and performing treatments and interventions, ordering and review of laboratory studies, ordering and review of radiographic studies, pulse oximetry and re-evaluation of patient's condition.   Marland Kitchen1-3 Lead EKG Interpretation  Performed by: Tuff Clabo, Delice Bison, DO Authorized by: Roanne Haye, Delice Bison, DO     Interpretation: abnormal     ECG rate:  115   ECG rate assessment: tachycardic     Rhythm: sinus tachycardia     Ectopy: none     Conduction: normal       IMPRESSION / MDM / ASSESSMENT AND PLAN / ED COURSE  I reviewed the triage vital signs and the nursing notes.    Patient here with anaphylaxis.  She is hypoxic, hypotensive with diffuse flushing and urticaria.  Symptoms started about 30 minutes after eating shrimp.  The patient is on the cardiac monitor to evaluate for evidence of arrhythmia and/or significant heart rate changes.   DIFFERENTIAL DIAGNOSIS (includes but not limited to):   Anaphylactic shock, COPD exacerbation, CHF, pneumonia, pneumothorax, ACS   Patient's presentation is most consistent with acute presentation with potential threat to life or bodily function.   PLAN: We  will obtain CBC, BMP, chest x-ray.  We will give IV Benadryl, IV fluids, IM epinephrine, IV Pepcid.  She received Solu-Medrol and breathing treatments with EMS.  Currently lungs clear to auscultation.   MEDICATIONS GIVEN IN ED: Medications  ipratropium-albuterol (DUONEB) 0.5-2.5 (3) MG/3ML nebulizer solution 3 mL (has no administration in time range)  EPINEPHrine (EPI-PEN) injection 0.3 mg (0.3 mg Intramuscular Given 10/08/21 2328)  diphenhydrAMINE (BENADRYL) injection 50 mg (50 mg Intravenous Given 10/08/21 2327)  famotidine (PEPCID) IVPB 20 mg premix (20 mg Intravenous Bolus 10/08/21 2336)  sodium chloride 0.9 % bolus 1,000 mL (0 mLs Intravenous Stopped 10/09/21 0046)     ED COURSE: Patient's labs show slight leukocytosis.  Normal hemoglobin.  Normal platelet count.  She has chronic kidney disease which is stable.  Chest x-ray reviewed/interpreted by myself and shows no infiltrate or pneumothorax but does show possible edema.  We will add on a troponin and BNP.  She denies any chest pain.  EKG shows sinus tachycardia.  She denies any history of CHF.  ABG obtained but was likely a venous sample.  Patient seems to have improved significantly after epinephrine.  We will wean her off of BiPAP.    Patient was able to be weaned to nasal cannula.  Troponin has come back slightly elevated at 36.  BNP normal.  We will add on subsequent troponin.  We will continue to observe for 4 hours after receiving epinephrine.    Patient no longer having any urticaria, hypotension but is hypoxic off of oxygen with ambulation to the 70s.  She is now wheezing again and I suspect that COPD is also playing a role.  We will continue breathing treatments and discussed with medicine for admission given new oxygen requirement.  Second troponin is elevated at 67.  I suspect all of this is secondary to demand ischemia from her anaphylaxis.  Patient denies any current chest pain or shortness of breath.   CONSULTS:  Consulted and  discussed patient's case with hospitalist, Dr. Damita Dunnings.  I have recommended admission and consulting physician agrees and will place admission orders.  Patient (and family if present) agree with this plan.   I reviewed all nursing notes, vitals, pertinent previous records.  All labs, EKGs, imaging ordered have been independently reviewed and interpreted by myself.    OUTSIDE RECORDS REVIEWED: Reviewed patient's note from [facility] today.       FINAL CLINICAL IMPRESSION(S) / ED DIAGNOSES   Final diagnoses:  Anaphylactic shock  COPD exacerbation ([place])  Acute respiratory failure with hypoxia ([place])     Rx / DC Orders   ED Discharge Orders     None        Note:  This document was prepared using Dragon voice recognition software and may include unintentional dictation errors.   Crimson Beer, Delice Bison, DO 10/09/21 0330

## 2021-10-09 NOTE — ED Notes (Signed)
MD at bedside, turned off 2LNC, to monitor pt. Per MD, pt may be able to go home if SPO2 stays > 88%

## 2021-10-09 NOTE — ED Notes (Signed)
Ambulated with patient in hallway without oxygen. Pt's O2 sats were consistently in the 70's and 80's. Pt let this writer know when she felt SOB. Assisted patient back on stretcher. Tolerated fair. Dr. Leonides Schanz made aware.

## 2021-10-09 NOTE — Assessment & Plan Note (Addendum)
Last flare 09/01/2021, hospitalized at [facility] Last plasma exchange was 6/9 On rituximab infusions, azathioprine, prednisone Platelet count 267,000 Continue to monitor platelets

## 2021-10-09 NOTE — Care Management Obs Status (Signed)
[place] NOTIFICATION   Patient Details  Name: CORTNEY BEISSEL MRN: [id number] Date of Birth: [birth year]   Medicare Observation Status Notification Given:  Yes    Anselm Pancoast, RN 10/09/2021, 9:44 AM

## 2021-10-09 NOTE — Assessment & Plan Note (Signed)
History of epidural abscess s/p laminectomy 2012 Continue tramadol and gabapentin

## 2021-10-09 NOTE — Assessment & Plan Note (Signed)
Creatinine 1.48, near baseline of 1.18 in Care Everywhere

## 2021-10-09 NOTE — Assessment & Plan Note (Addendum)
Hypoxic to 57 with EMS.  ABG on arrival on CPAP at 100% with pH 7.38, PCO2 61 and PO2 of 31 Improving and was able to be weaned from CPAP to O2 via nasal cannula by admission Secondary to anaphylactic reaction Wheezing on exam possibly residual of anaphylactic reaction versus mild exacerbation of COPD Treat anaphylactic reaction and COPD as outlined Continue supplemental oxygen and wean as tolerated

## 2021-10-09 NOTE — Assessment & Plan Note (Signed)
DuoNebs as needed 

## 2021-10-09 NOTE — Care Management CC44 (Signed)
Condition Code 44 Documentation Completed  Patient Details  Name: JEREE DELCID MRN: [id number] Date of Birth: [birth year]   Condition Code 44 given:  Yes Patient signature on Condition Code 44 notice:  Yes Documentation of 2 MD's agreement:  Yes Code 44 added to claim:  Yes    Anselm Pancoast, RN 10/09/2021, 9:44 AM

## 2021-10-09 NOTE — Assessment & Plan Note (Addendum)
Suspect secondary to shellfish though no prior similar reactions Lower suspicion for reaction to plasmapheresis earlier in the day  Continue IV Benadryl, famotidine, Pepcid and Solu-Medrol with epinephrine if recurrent symptoms Allergy list updated Close monitoring for recurrent symptoms Monitor platelets

## 2021-10-09 NOTE — Assessment & Plan Note (Signed)
Suspect demand ischemia from acute reaction to Patient denies chest pain and EKG nonacute Continue to trend

## 2021-10-09 NOTE — Assessment & Plan Note (Addendum)
Hold home meds due to hypotension related to anaphylactic reaction and resume as appropriate

## 2021-10-09 NOTE — Assessment & Plan Note (Signed)
Smokes 2 packs of cigarettes per day Declines nicotine patch

## 2021-10-09 NOTE — Assessment & Plan Note (Addendum)
Patient with ongoing plasma exchange, last one 6/9

## 2021-10-12 ENCOUNTER — Telehealth: Payer: Self-pay

## 2021-10-12 NOTE — Telephone Encounter (Signed)
-----   Message from [name], NP sent at 10/12/2021  2:49 PM EDT ----- Please call and check on her.  Is she getting her care at [facility] now?

## 2021-10-12 NOTE — Telephone Encounter (Signed)
I spoke with the patient daughter and she stated the patient is doing just fine. She is going to [facility] on Monday/Wednesday/Friday for a therapeutic plasma exchange.

## 2021-10-14 ENCOUNTER — Other Ambulatory Visit: Payer: Medicare Other | Admitting: Urology

## 2021-10-27 LAB — BLOOD GAS, ARTERIAL
Acid-Base Excess: 8.7 mmol/L — ABNORMAL HIGH (ref 0.0–2.0)
Bicarbonate: 36.1 mmol/L — ABNORMAL HIGH (ref 20.0–28.0)
Delivery systems: POSITIVE
FIO2: 100 %
O2 Saturation: 44.8 %
Patient temperature: 37
pCO2 arterial: 61 mmHg — ABNORMAL HIGH (ref 32–48)
pH, Arterial: 7.38 (ref 7.35–7.45)
pO2, Arterial: 31 mmHg — CL (ref 83–108)

## 2021-12-04 IMAGING — DX DG CHEST 1V PORT
1 series · 1 of 1 positions shown · non-contrast
Comparison: Prior chest x-ray 08/20/2013

CLINICAL DATA: Weakness

EXAM:
PORTABLE CHEST 1 VIEW

[chest]
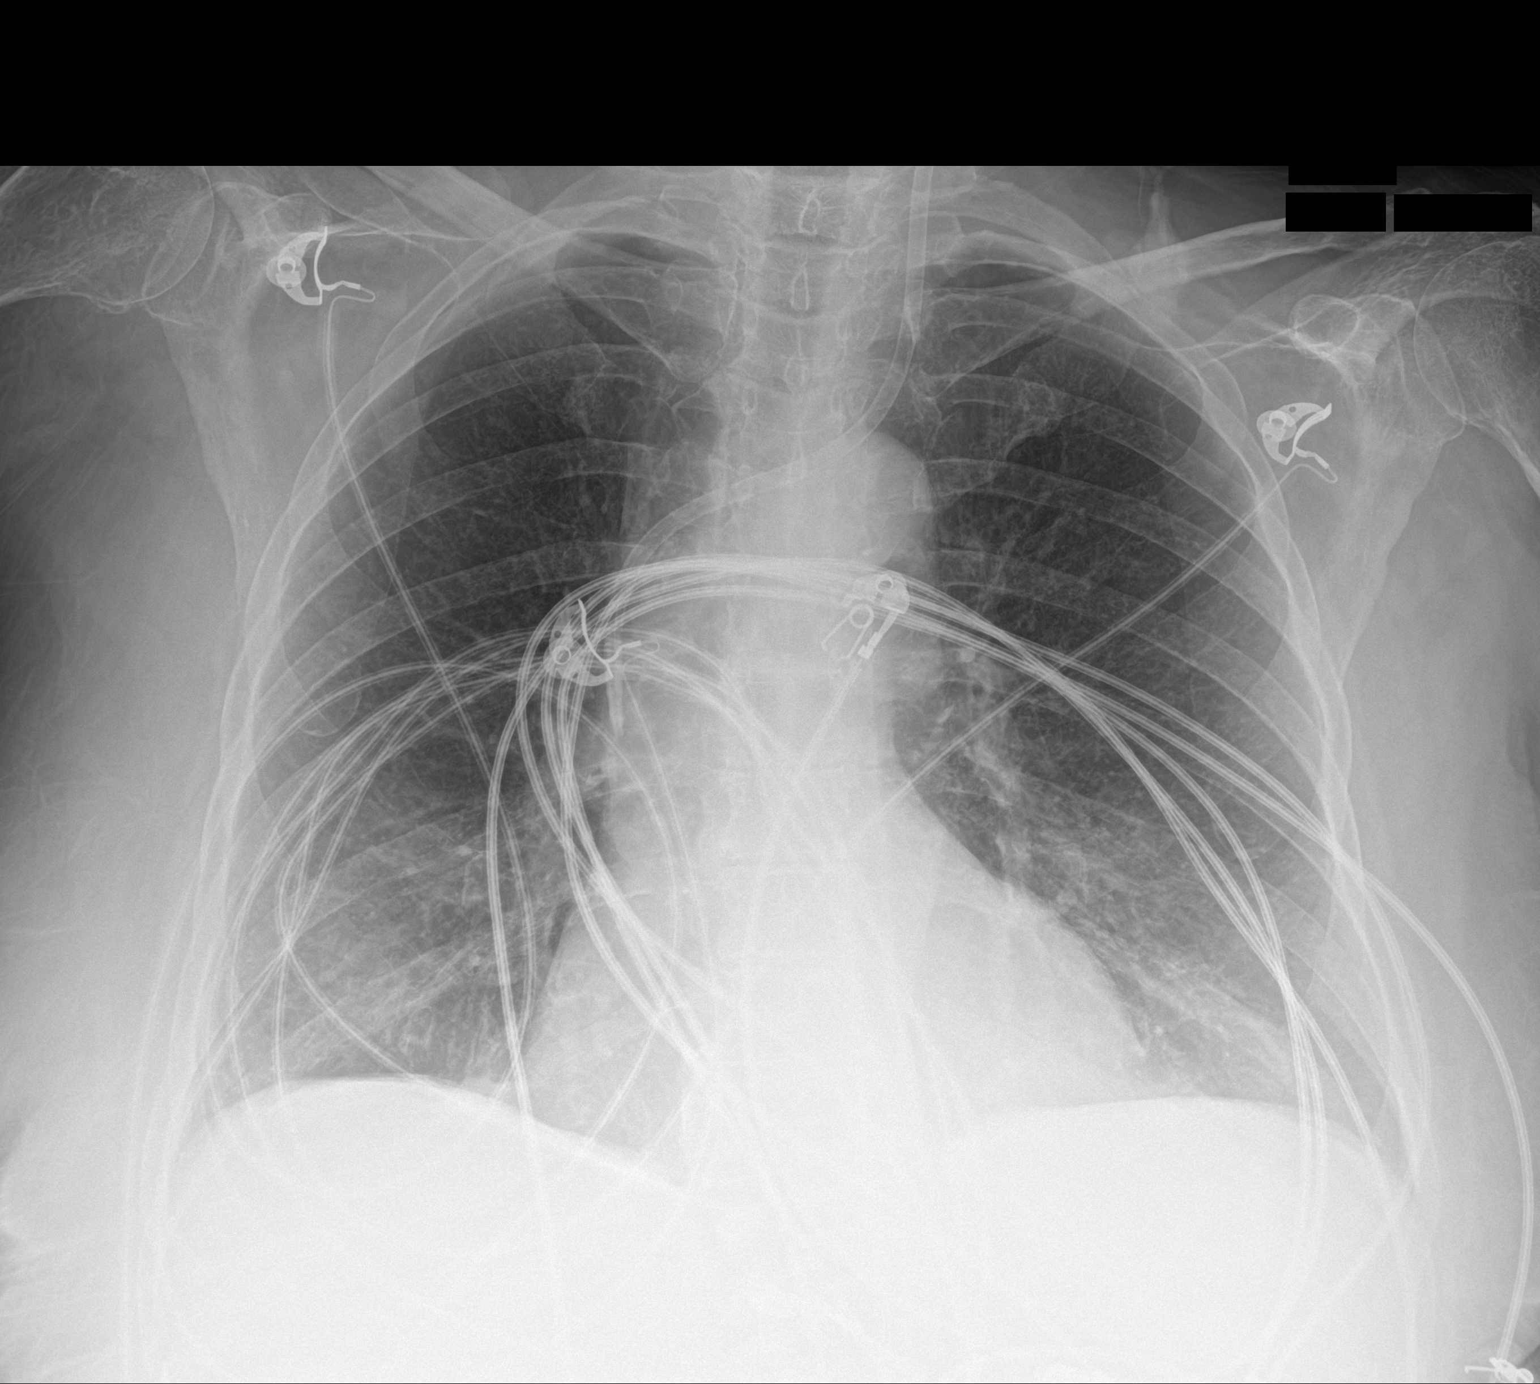

[1 of 1 positions shown; findings below may reference images not displayed]

FINDINGS: Left IJ approach hemodialysis catheter. The catheter tip projects
over the mid SVC. Stable cardiac and mediastinal contours. No
pleural effusion or pulmonary edema. No evidence of airspace opacity
or pneumothorax. No acute osseous abnormality.
IMPRESSION: No active disease.

Left IJ hemodialysis catheter.  Catheter tip overlies the mid SVC.

## 2022-01-03 IMAGING — US US PELVIS COMPLETE WITH TRANSVAGINAL
1 series · 13 of 25 positions shown · non-contrast
Comparison: CT abdomen and pelvis 09/24/2020

CLINICAL DATA: LEFT adnexal cyst, abnormal CT, postmenopausal

EXAM:
TRANSABDOMINAL AND TRANSVAGINAL ULTRASOUND OF PELVIS
TECHNIQUE: Both transabdominal and transvaginal ultrasound examinations of the
pelvis were performed. Transabdominal technique was performed for
global imaging of the pelvis including uterus, ovaries, adnexal
regions, and pelvic cul-de-sac. It was necessary to proceed with
endovaginal exam following the transabdominal exam to visualize the
uterus, ovaries and adnexa.

[Series 1: us pelvis complete with transvaginal · 117 acquisitions, 13 frames shown]
[im 1/117]
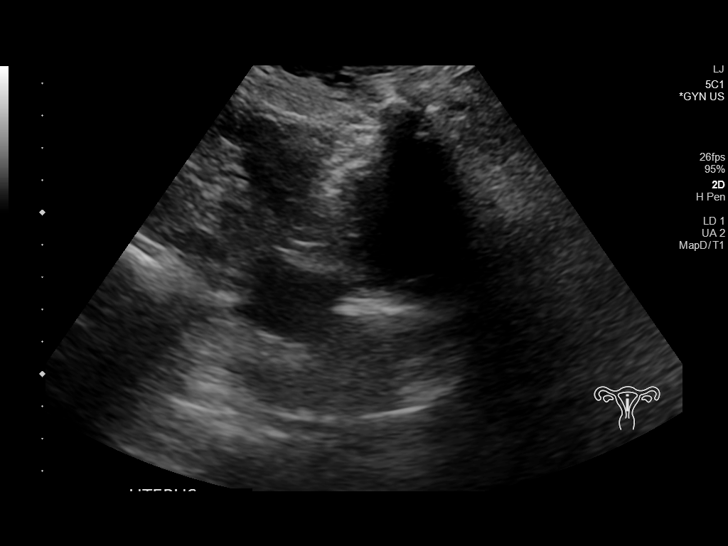
[im 10/117]
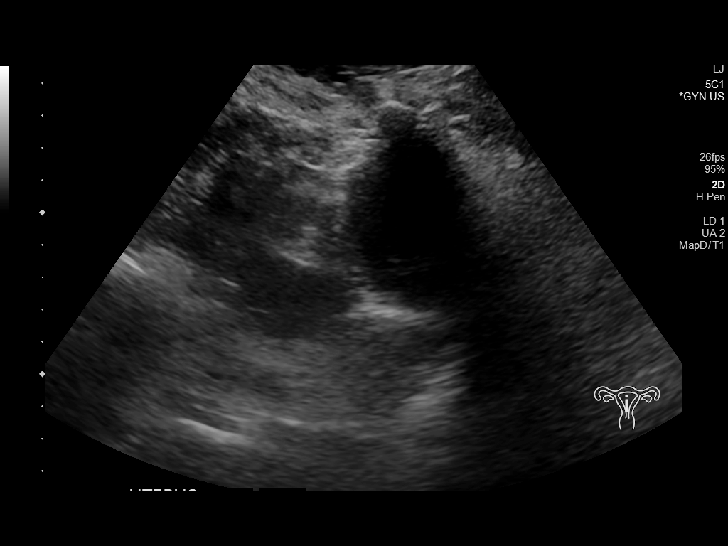
[im 20/117]
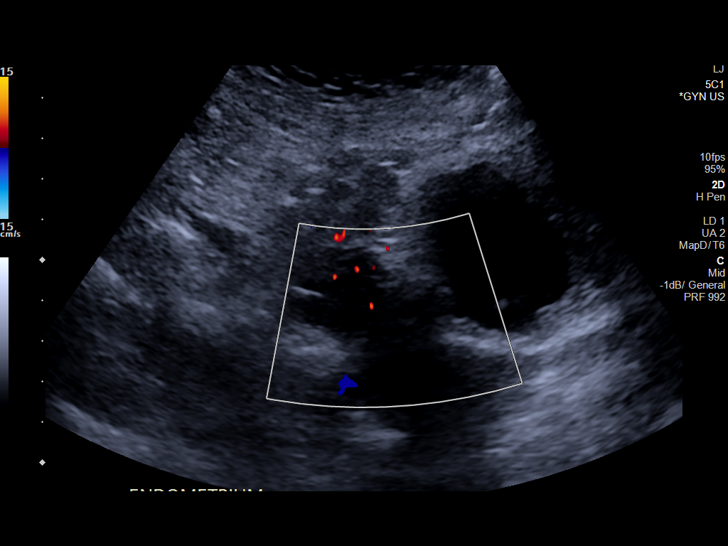
[im 30/117]
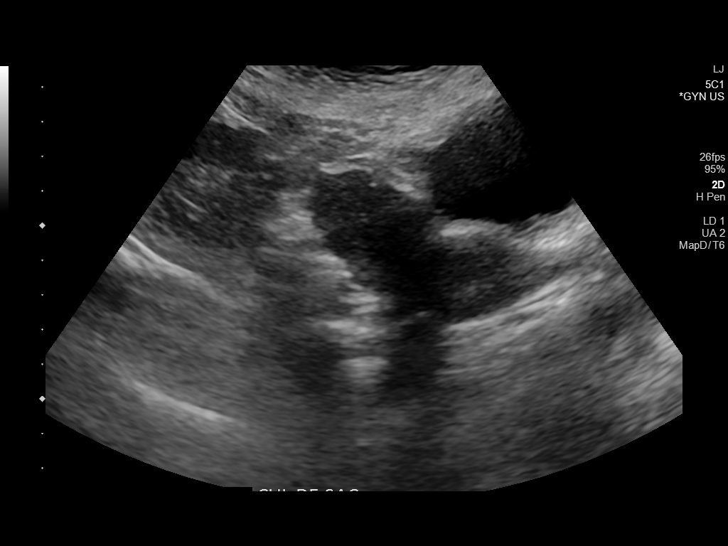
[im 39/117]
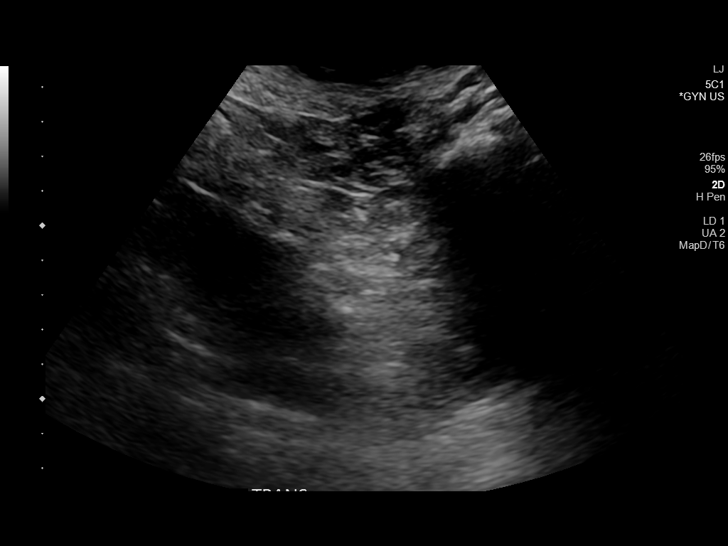
[im 49/117]
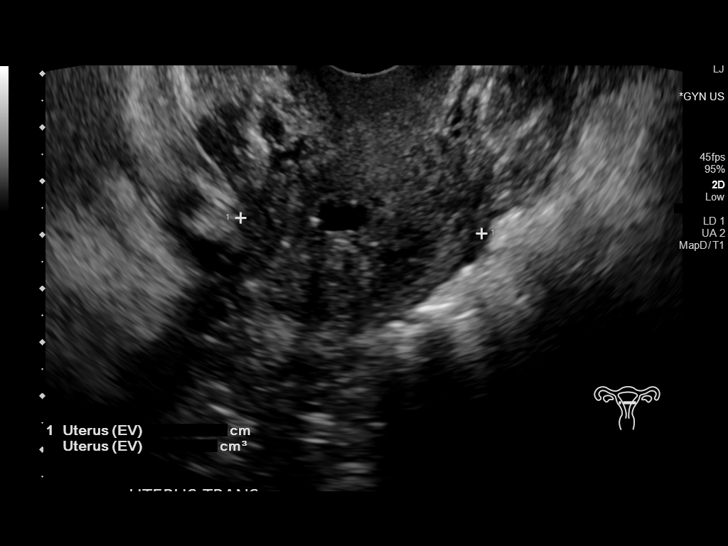
[im 59/117]
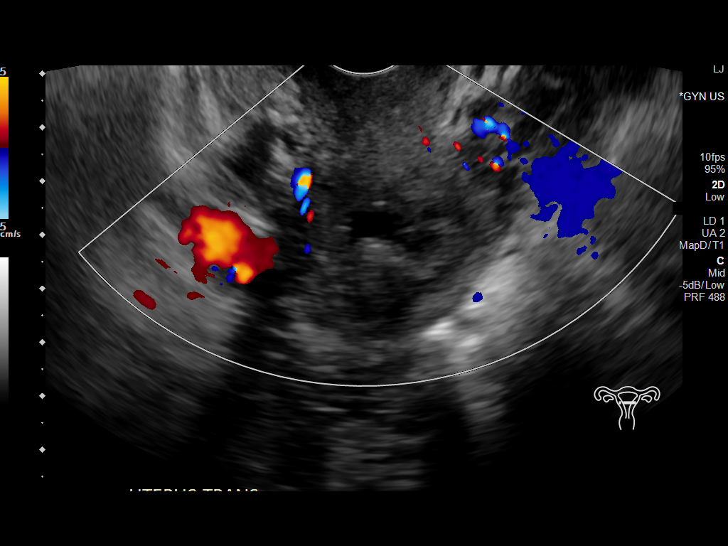
[im 68/117]
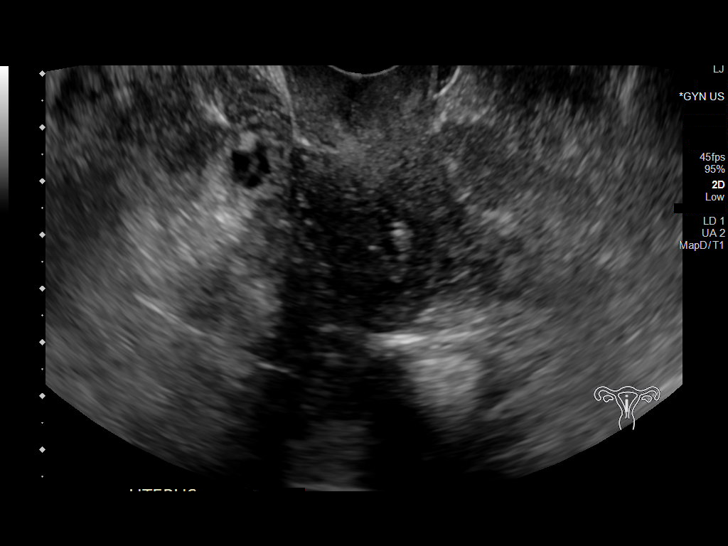
[im 78/117]
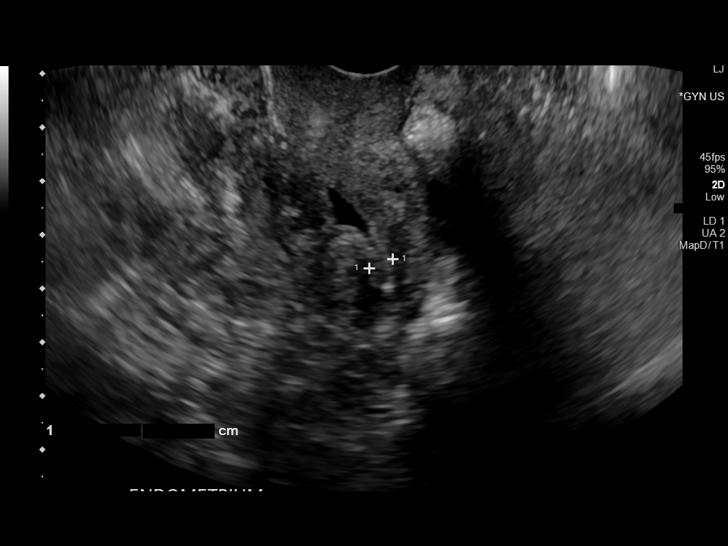
[im 88/117]
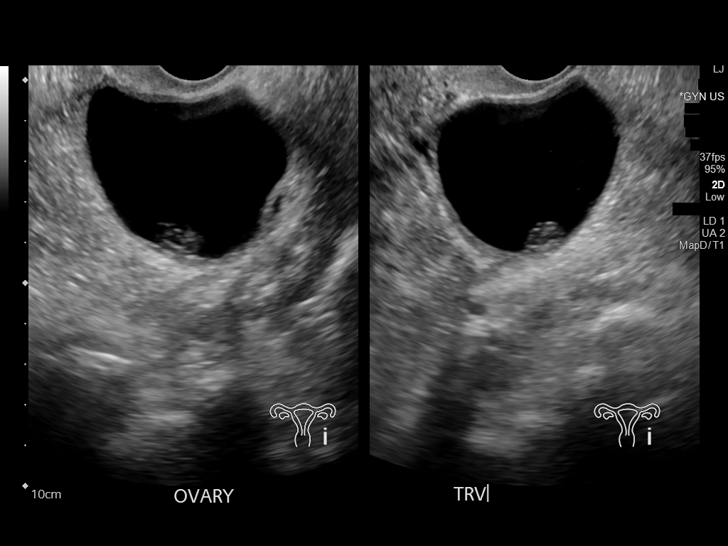
[im 97/117]
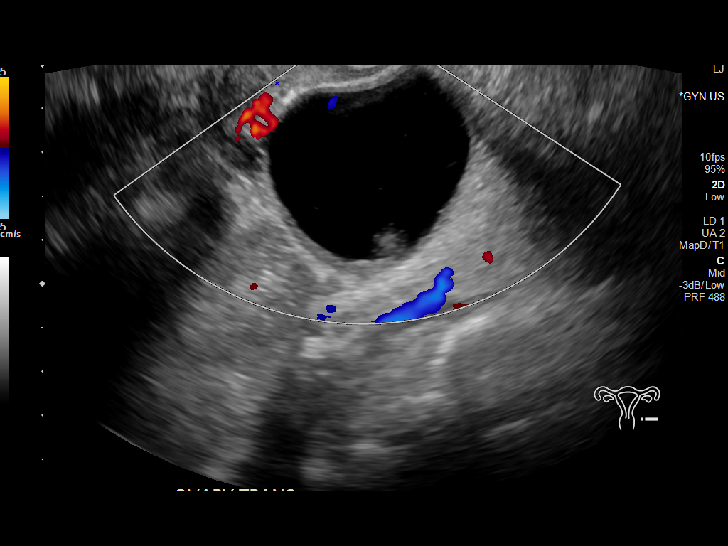
[im 107/117]
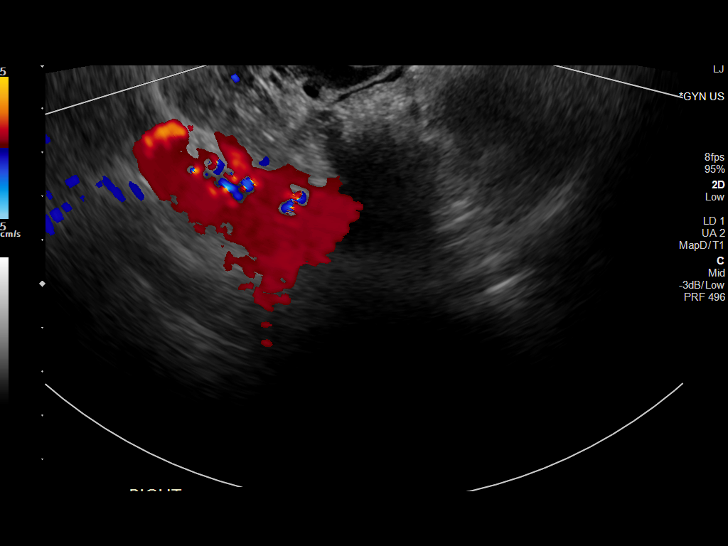
[im 117/117]
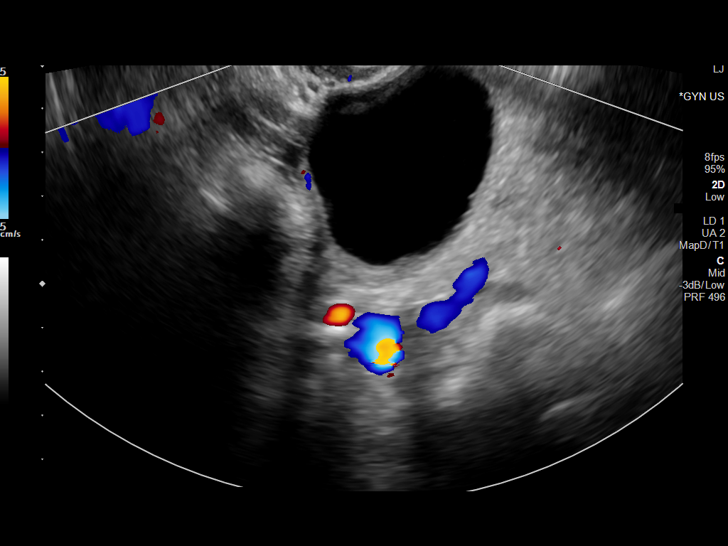

[13 of 25 positions shown; findings below may reference images not displayed]

FINDINGS: Uterus

Measurements: 6.4 x 3.0 x 4.5 cm = volume: 46 mL. Atrophic.
Heterogeneous appearance. Small fundal leiomyoma 16 mm diameter,
extends submucosal.

Endometrium

Thickness: 5 mm.  Nonspecific endometrial fluid.

Right ovary

Measurements: 2.0 x 1.4 x 1.6 cm = volume: 2.6 mL. Normal morphology
without mass

Left ovary

Measurements: 6.2 x 4.5 x 5.5 cm = volume: 81 mL. Complex cyst LEFT
ovary 5.1 x 3.4 x 4.4 cm, containing a 12 x 7 x 10 mm mural nodule
and a small peripheral septation 2.3 mm thick no definite blood flow
within the mural nodule on color Doppler imaging.

Other findings

No free pelvic fluid.  No additional pelvic masses.
IMPRESSION: Submucosal fundal leiomyoma within uterus 16 mm diameter.

Nonspecific endometrial fluid.

5.1 cm diameter complex cyst LEFT ovary containing a 12 mm mural
nodule which lacks internal blood flow on color Doppler imaging;
this represents an indeterminate probably benign lesion but surgical
evaluation is recommended.

These results will be called to the ordering clinician or
representative by the Radiologist Assistant, and communication
documented in the PACS or [REDACTED].

## 2023-09-11 ENCOUNTER — Emergency Department (HOSPITAL_COMMUNITY)

## 2023-09-11 ENCOUNTER — Emergency Department (HOSPITAL_COMMUNITY)
Admission: EM | Admit: 2023-09-11 | Discharge: 2023-09-12 | Disposition: A | Discharge status: Nursing Home (Includes ICF) | Attending: Emergency Medicine | Admitting: Emergency Medicine

## 2023-09-11 ENCOUNTER — Encounter (HOSPITAL_COMMUNITY): Payer: Self-pay

## 2023-09-11 DIAGNOSIS — R509 Fever, unspecified: Secondary | ICD-10-CM | POA: Insufficient documentation

## 2023-09-11 DIAGNOSIS — J449 Chronic obstructive pulmonary disease, unspecified: Secondary | ICD-10-CM | POA: Diagnosis not present

## 2023-09-11 DIAGNOSIS — D696 Thrombocytopenia, unspecified: Secondary | ICD-10-CM | POA: Diagnosis not present

## 2023-09-11 DIAGNOSIS — Z720 Tobacco use: Secondary | ICD-10-CM | POA: Insufficient documentation

## 2023-09-11 DIAGNOSIS — D72829 Elevated white blood cell count, unspecified: Secondary | ICD-10-CM | POA: Insufficient documentation

## 2023-09-11 DIAGNOSIS — R531 Weakness: Secondary | ICD-10-CM | POA: Diagnosis present

## 2023-09-11 LAB — CBC WITH DIFFERENTIAL/PLATELET
Abs Immature Granulocytes: 0.11 10*3/uL — ABNORMAL HIGH (ref 0.00–0.07)
Basophils Absolute: 0.1 10*3/uL (ref 0.0–0.1)
Basophils Relative: 1 %
Eosinophils Absolute: 0 10*3/uL (ref 0.0–0.5)
Eosinophils Relative: 0 %
HCT: 45.8 % (ref 36.0–46.0)
Hemoglobin: 15.2 g/dL — ABNORMAL HIGH (ref 12.0–15.0)
Immature Granulocytes: 1 %
Lymphocytes Relative: 4 %
Lymphs Abs: 0.6 10*3/uL — ABNORMAL LOW (ref 0.7–4.0)
MCH: 29.6 pg (ref 26.0–34.0)
MCHC: 33.2 g/dL (ref 30.0–36.0)
MCV: 89.3 fL (ref 80.0–100.0)
Monocytes Absolute: 1.4 10*3/uL — ABNORMAL HIGH (ref 0.1–1.0)
Monocytes Relative: 9 %
Neutro Abs: 14.1 10*3/uL — ABNORMAL HIGH (ref 1.7–7.7)
Neutrophils Relative %: 85 %
Platelets: 142 10*3/uL — ABNORMAL LOW (ref 150–400)
RBC: 5.13 MIL/uL — ABNORMAL HIGH (ref 3.87–5.11)
RDW: 13.2 % (ref 11.5–15.5)
WBC: 16.3 10*3/uL — ABNORMAL HIGH (ref 4.0–10.5)
nRBC: 0 % (ref 0.0–0.2)

## 2023-09-11 LAB — I-STAT CG4 LACTIC ACID, ED: Lactic Acid, Venous: 1.2 mmol/L (ref 0.5–1.9)

## 2023-09-11 LAB — COMPREHENSIVE METABOLIC PANEL WITH GFR
ALT: 13 U/L (ref 0–44)
AST: 23 U/L (ref 15–41)
Albumin: 3.3 g/dL — ABNORMAL LOW (ref 3.5–5.0)
Alkaline Phosphatase: 52 U/L (ref 38–126)
Anion gap: 8 (ref 5–15)
BUN: 24 mg/dL — ABNORMAL HIGH (ref 8–23)
CO2: 23 mmol/L (ref 22–32)
Calcium: 9.2 mg/dL (ref 8.9–10.3)
Chloride: 106 mmol/L (ref 98–111)
Creatinine, Ser: 1.24 mg/dL — ABNORMAL HIGH (ref 0.44–1.00)
GFR, Estimated: 44 mL/min — ABNORMAL LOW (ref >60)
Glucose, Bld: 111 mg/dL — ABNORMAL HIGH (ref 70–99)
Potassium: 4 mmol/L (ref 3.5–5.1)
Sodium: 137 mmol/L (ref 135–145)
Total Bilirubin: 0.5 mg/dL (ref 0.0–1.2)
Total Protein: 5.5 g/dL — ABNORMAL LOW (ref 6.5–8.1)

## 2023-09-11 LAB — RESP PANEL BY RT-PCR (RSV, FLU A&B, COVID)  RVPGX2
Influenza A by PCR: NEGATIVE
Influenza B by PCR: NEGATIVE
Resp Syncytial Virus by PCR: NEGATIVE
SARS Coronavirus 2 by RT PCR: NEGATIVE

## 2023-09-11 MED ORDER — SODIUM CHLORIDE 0.9 % IV SOLN
2.0000 g | Freq: Once | INTRAVENOUS | Status: AC
  Administered 2023-09-11: 2 g via INTRAVENOUS
  Filled 2023-09-11: qty 12.5

## 2023-09-11 MED ORDER — VANCOMYCIN HCL IN DEXTROSE 1-5 GM/200ML-% IV SOLN
1000.0000 mg | Freq: Once | INTRAVENOUS | Status: AC
  Administered 2023-09-12: 1000 mg via INTRAVENOUS
  Filled 2023-09-11: qty 200

## 2023-09-11 MED ORDER — ACETAMINOPHEN 325 MG PO TABS
650.0000 mg | ORAL_TABLET | Freq: Once | ORAL | Status: AC
  Administered 2023-09-11: 650 mg via ORAL
  Filled 2023-09-11: qty 2

## 2023-09-11 MED ORDER — METRONIDAZOLE 500 MG/100ML IV SOLN
500.0000 mg | Freq: Once | INTRAVENOUS | Status: AC
Start: 2023-09-11 — End: 2023-09-12
  Administered 2023-09-11: 500 mg via INTRAVENOUS
  Filled 2023-09-11: qty 100

## 2023-09-11 MED ORDER — LACTATED RINGERS IV SOLN: INTRAVENOUS | Status: DC

## 2023-09-11 MED ORDER — LACTATED RINGERS IV BOLUS (SEPSIS)
1000.0000 mL | Freq: Once | INTRAVENOUS | Status: AC
  Administered 2023-09-12: 1000 mL via INTRAVENOUS

## 2023-09-11 MED ORDER — LACTATED RINGERS IV BOLUS (SEPSIS)
1000.0000 mL | Freq: Once | INTRAVENOUS | Status: AC
  Administered 2023-09-11: 1000 mL via INTRAVENOUS

## 2023-09-11 NOTE — ED Provider Notes (Signed)
 [place] [facility] Provider Note   CSN: [id number] Arrival date & time: 09/11/23  1857     History {Add pertinent medical, surgical, social history, OB history to HPI:1} Chief Complaint  Patient presents with   Weakness    Pt arrived by ems d/t sudden onset of unsteady gait. 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    [name] is a 83 y.o. female.   Weakness  Patient has a history of sepsis, TTP, delirium, COPD, chronic tobacco use, endocarditis.  Patient has been followed by hematology oncology at [facility].  Family states they have been weaning off her plasmapheresis treatments.  Patient had a doctor's appointment today.  She had labs drawn her platelet count was 167.  After coming home from the doctor's office she became weak and confused.  Family states that has started to improve but then they noticed she started to feel very warm and feverish.  Patient has not had any trouble with coughing.  She denies any vomiting or diarrhea.  No headaches.  Patient denies any complaints herself.  Family states she does continue to smoke but normally her oxygen saturation is in the low 90s.  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    Home Medications Prior to Admission medications   Medication Sig Start Date End Date Taking? Authorizing Provider  ascorbic acid  (VITAMIN C) 500 MG tablet Take 500 mg by mouth daily.    [provider]  cholecalciferol  (VITAMIN D) 400 UNITS TABS Take 400 Units by mouth daily.    [provider]  cyanocobalamin  1000 MCG tablet Take 1,000 mcg by mouth daily.    [provider]  FLUoxetine  (PROZAC ) 20 MG capsule Take 20 mg by mouth daily.    Jimmy Moulding, MD  folic acid  (FOLVITE ) 1 MG tablet  Take 1 mg by mouth in the morning and at bedtime.    [provider]  gabapentin  (NEURONTIN ) 600 MG tablet Take 1 tablet (600 mg total) by mouth 3 (three) times daily. Patient taking differently: Take 600 mg by mouth 2 (two) times daily as needed. 06/26/21   Vann, Jessica U, DO  guaiFENesin (MUCINEX) 600 MG 12 hr tablet Take 600 mg by mouth daily.    [provider]  latanoprost  (XALATAN ) 0.005 % ophthalmic solution Place 1 drop into both eyes. 05/31/21   [provider]  Multiple Vitamin (MULTIVITAMIN) tablet Take 1 tablet by mouth daily.     [provider]  polyvinyl alcohol  (LIQUIFILM TEARS) 1.4 % ophthalmic solution Place 1 drop into both eyes as needed for dry eyes.    [provider]  predniSONE  (DELTASONE ) 20 MG tablet Take 1.5 tablets (30 mg total) by mouth daily with breakfast. Or as directed by MD 08/25/21   Roseline Conine, NP  QUEtiapine  (SEROQUEL ) 25 MG tablet Take 25 mg by mouth at bedtime.    [provider]  rosuvastatin  (CRESTOR ) 20 MG tablet Take 10 mg by mouth at bedtime. 05/22/20   [provider]  tiotropium (SPIRIVA ) 18 MCG inhalation capsule Place 18 mcg into inhaler and inhale daily.    [provider]  traMADol  (ULTRAM ) 50 MG tablet Take 1 tablet (50 mg total) by mouth every 6 (six)  hours as needed for moderate pain. 06/26/21   Vann, Jessica U, DO      Allergies    Adhesive [tape], Cefuroxime  axetil, Cefuroxime  axetil, Pedi-pre tape spray [wound dressing adhesive], Shellfish allergy, Other, Sulfa antibiotics, Amoxicillin-pot clavulanate, and Sulfa drugs cross reactors    Review of Systems   Review of Systems  Neurological:  Positive for weakness.    Physical Exam Updated Vital Signs BP (!) 102/47 (BP Location: Right Arm)   Pulse 83   Temp (!) 101.8 F (38.8 C) (Oral)   Ht 1.626 m (5\' 4" )   Wt 72.6 kg   SpO2 92% Comment: pt was 87% on room air  BMI 27.46 kg/m  Physical Exam Vitals and nursing  note reviewed.  Constitutional:      General: She is not in acute distress.    Appearance: She is well-developed.  HENT:     Head: Normocephalic and atraumatic.     Right Ear: External ear normal.     Left Ear: External ear normal.  Eyes:     General: No visual field deficit or scleral icterus.       Right eye: No discharge.        Left eye: No discharge.     Conjunctiva/sclera: Conjunctivae normal.  Neck:     Trachea: No tracheal deviation.  Cardiovascular:     Rate and Rhythm: Normal rate and regular rhythm.  Pulmonary:     Effort: Pulmonary effort is normal. No respiratory distress.     Breath sounds: Normal breath sounds. No stridor. No wheezing or rales.  Abdominal:     General: Bowel sounds are normal. There is no distension.     Palpations: Abdomen is soft.     Tenderness: There is no abdominal tenderness. There is no guarding or rebound.  Musculoskeletal:        General: No tenderness.     Cervical back: Neck supple.  Skin:    General: Skin is warm and dry.     Findings: No rash.  Neurological:     Mental Status: She is alert and oriented to person, place, and time.     Cranial Nerves: No cranial nerve deficit, dysarthria or facial asymmetry.     Sensory: No sensory deficit.     Motor: No abnormal muscle tone, seizure activity or pronator drift.     Coordination: Coordination normal.     Comments:  able to hold both legs off bed for 5 seconds, sensation intact in all extremities,  no left or right sided neglect, normal finger-nose exam bilaterally, no nystagmus noted   Psychiatric:        Mood and Affect: Mood normal.     ED Results / Procedures / Treatments   Labs (all labs ordered are listed, but only abnormal results are displayed) Labs Reviewed  COMPREHENSIVE METABOLIC PANEL WITH GFR - Abnormal; Notable for the following components:      Result Value   Glucose, Bld 111 (*)    BUN 24 (*)    Creatinine, Ser 1.24 (*)    Total Protein 5.5 (*)    Albumin  3.3 (*)    GFR, Estimated 44 (*)    All other components within normal limits  CBC WITH DIFFERENTIAL/PLATELET - Abnormal; Notable for the following components:   WBC 16.3 (*)    RBC 5.13 (*)    Hemoglobin 15.2 (*)    Platelets 142 (*)    Neutro Abs 14.1 (*)    Lymphs Abs  0.6 (*)    Monocytes Absolute 1.4 (*)    Abs Immature Granulocytes 0.11 (*)    All other components within normal limits  RESP PANEL BY RT-PCR (RSV, FLU A&B, COVID)  [id number]  CULTURE, BLOOD (ROUTINE X 2)  CULTURE, BLOOD (ROUTINE X 2)  URINALYSIS, W/ REFLEX TO CULTURE (INFECTION SUSPECTED)  I-STAT CG4 LACTIC ACID, ED  I-STAT CG4 LACTIC ACID, ED    EKG EKG Interpretation Date/Time:  Monday Sep 11 2023 20:25:08 EDT Ventricular Rate:  74 PR Interval:  166 QRS Duration:  112 QT Interval:  431 QTC Calculation: 479 R Axis:   -44  Text Interpretation: Sinus rhythm Probable left atrial enlargement LVH with secondary repolarization abnormality Anterior Q waves, possibly due to LVH Since last tracing rate slower Confirmed by Trish Furl [phone]) on 09/11/2023 8:28:32 PM  Radiology CT Head Wo Contrast Result Date: 09/11/2023 CLINICAL DATA:  Altered mental status EXAM: CT HEAD WITHOUT CONTRAST TECHNIQUE: Contiguous axial images were obtained from the base of the skull through the vertex without intravenous contrast. RADIATION DOSE REDUCTION: This exam was performed according to the departmental dose-optimization program which includes automated exposure control, adjustment of the mA and/or kV according to patient size and/or use of iterative reconstruction technique. COMPARISON:  06/20/2021 FINDINGS: Brain: No evidence of acute infarction, hemorrhage, hydrocephalus, extra-axial collection or mass lesion/mass effect. Mild atrophic changes are noted. Chronic white matter ischemic changes seen in the posterior right frontal lobe. Vascular: No hyperdense vessel or unexpected calcification. Skull: Normal. Negative for fracture or focal  lesion. Sinuses/Orbits: No acute finding. Other: None. IMPRESSION: Chronic atrophic and ischemic changes are noted. Electronically Signed   By: Violeta Grey M.D.   On: 09/11/2023 21:31   DG Chest Portable 1 View Result Date: 09/11/2023 CLINICAL DATA:  Possible sepsis EXAM: PORTABLE CHEST 1 VIEW COMPARISON:  10/08/2021 FINDINGS: Bilateral chest wall ports are noted. Previously seen dialysis catheter has been removed. Cardiac shadow is mildly prominent. Aortic calcifications are seen. Central vascular congestion with mild edema is noted. No focal infiltrate or effusion is seen. IMPRESSION: Mild CHF. Electronically Signed   By: Violeta Grey M.D.   On: 09/11/2023 20:18    Procedures Procedures  {Document cardiac monitor, telemetry assessment procedure when appropriate:1}  Medications Ordered in ED Medications  lactated ringers  infusion ( Intravenous New Bag/Given 09/11/23 2201)  ceFEPIme (MAXIPIME) 2 g in sodium chloride  0.9 % 100 mL IVPB (2 g Intravenous New Bag/Given 09/11/23 2210)  metroNIDAZOLE (FLAGYL) IVPB 500 mg (has no administration in time range)  vancomycin  (VANCOCIN ) IVPB 1000 mg/200 mL premix (has no administration in time range)  lactated ringers  bolus 1,000 mL (0 mLs Intravenous Stopped 09/11/23 2130)  acetaminophen  (TYLENOL ) tablet 650 mg (650 mg Oral Given 09/11/23 2202)    ED Course/ Medical Decision Making/ A&P Clinical Course as of 09/11/23 2238  Mon Sep 11, 2023  2054 Chest x-ray suggest possible mild CHF [JK]  2231 CBC with Differential(!) Leukocytosis noted with white count of 16.  Platelet count at 142 [JK]  2231 CBC with Differential(!) [JK]  2232 Comprehensive metabolic panel(!) Creatinine increased compared to previous [JK]  2232 Head CT without acute abnormality [JK]    Clinical Course User Index [JK] Trish Furl, MD   {   Click here for ABCD2, HEART and other calculatorsREFRESH Note before signing :1}  Medical Decision  Making Problems Addressed: Fever, unspecified fever cause: acute illness or injury that poses a threat to life or bodily functions Thrombocytopenia (HCC): acute illness or injury that poses a threat to life or bodily functions  Amount and/or Complexity of Data Reviewed Labs: ordered. Decision-making details documented in ED Course. Radiology: ordered and independent interpretation performed.  Risk OTC drugs. Prescription drug management.   Patient presented to the ED for evaluation fever and confusion.  Patient currently without any complaints of headache.  No neck pain.  Doubt meningitis based on exam.  Patient noted to have leukocytosis with a white count of 16,000.  No lactic acidosis at this time.  Head CT does not show any acute abnormality.  Chest x-ray without pneumonia.  Urinalysis still pending.  Family states in the past she has had a similar presentation with her acute TTP.  Platelet count is 144.  Slightly decreased from earlier today but no severe thrombocytopenia at this time.  I do think the patient would benefit from admission to the hospital for continued antibiotics observation.  Family requested transfer to [facility].  I will try to arrange for transfer.  {Document critical care time when appropriate:1} {Document review of labs and clinical decision tools ie heart score, Chads2Vasc2 etc:1}  {Document your independent review of radiology images, and any outside records:1} {Document your discussion with family members, caretakers, and with consultants:1} {Document social determinants of health affecting pt's care:1} {Document your decision making why or why not admission, treatments were needed:1} Final Clinical Impression(s) / ED Diagnoses Final diagnoses:  Fever, unspecified fever cause  Thrombocytopenia (HCC)    Rx / DC Orders ED Discharge Orders     None

## 2023-09-12 DIAGNOSIS — R531 Weakness: Secondary | ICD-10-CM | POA: Diagnosis present

## 2023-09-12 DIAGNOSIS — Z720 Tobacco use: Secondary | ICD-10-CM | POA: Diagnosis not present

## 2023-09-12 DIAGNOSIS — D696 Thrombocytopenia, unspecified: Secondary | ICD-10-CM | POA: Diagnosis not present

## 2023-09-12 DIAGNOSIS — D72829 Elevated white blood cell count, unspecified: Secondary | ICD-10-CM | POA: Diagnosis not present

## 2023-09-12 DIAGNOSIS — J449 Chronic obstructive pulmonary disease, unspecified: Secondary | ICD-10-CM | POA: Diagnosis not present

## 2023-09-12 DIAGNOSIS — R509 Fever, unspecified: Secondary | ICD-10-CM | POA: Diagnosis not present

## 2023-09-12 LAB — BLOOD CULTURE ID PANEL (REFLEXED) - BCID2

## 2023-09-12 LAB — URINALYSIS, W/ REFLEX TO CULTURE (INFECTION SUSPECTED)
Bilirubin Urine: NEGATIVE
Glucose, UA: NEGATIVE mg/dL
Hgb urine dipstick: NEGATIVE
Ketones, ur: NEGATIVE mg/dL
Nitrite: POSITIVE — AB
Protein, ur: 30 mg/dL — AB
Specific Gravity, Urine: 1.019 (ref 1.005–1.030)
WBC, UA: >50 WBC/hpf (ref 0–5)
pH: 5 (ref 5.0–8.0)

## 2023-09-13 NOTE — ED Notes (Signed)
 Pt called to inform of + Blood cultures - family stated pt admitted at [facility]

## 2023-09-14 LAB — CULTURE, BLOOD (ROUTINE X 2)

## 2023-09-14 LAB — URINE CULTURE: Culture: 80000 — AB

## 2023-12-25 NOTE — Progress Notes (Signed)
 Hematology Follow Up Clinic Visit   Patient ID: [name] is a 83 y.o. female. Referring Physician: No referring provider defined for this encounter. Primary Care Provider: Layman LELON Piety, MD  Subjective  HPI  [name] is a 83 y.o. F with medical hx of COPD, HLD, and multiply-relapsed TTP.    She has a lengthy history of a/i TTP with high titer inhibitor (73 BU). She has history of achieving normal ADAMTS13 levels, so less consistent with any component of USS.    Her history is complex, as follows: -TTP diagnosed in 2005, treated with plasma exchange, steroids, and rituximab  consolidation  -Relapse of TTP October 2011-plasma exchange, steroids, rituximab  -Relapse of TTP March 2015-plasma exchange, steroids, rituximab , and maintenance azathioprine  -Admission with severe thrombocytopenia and elevated LDH 07/16/2020, ADAMTS13 activity less than 2%, consistent with relapse of TTP       -Steroids/FFP given 07/16/2020       -Daily plasma exchange beginning 07/17/2020, 7 days, then qod starting 3/24, discharge 07/21/2020 on prednisone  60 mg daily       -Prednisone  taper to 40 mg daily 07/29/2020       -rituximab  07/30/2020, 08/06/2020, 08/13/2020, 08/20/2020       -plasmapheresis on 07/31/2020, 08/03/2020, 08/05/2020, and 08/07/2020       -prednisone  increased to 60 mg daily 08/17/2020       -Plasmapheresis 08/24/2020, 08/25/2020, 08/26/2020, 08/27/2020, 08/28/2020, 08/29/2020, 08/31/2020, 09/02/2020, 09/04/2020       -caplacizumab  initiated 08/24/2020       -Decrease prednisone  to 40 mg daily 08/28/2020       -ADAMTS13 37% on 09/04/2020       -Prednisone  decreased to 30 mg daily 09/07/2020       -Continue caplacizumab        -prednisone  decreased to 20 mg daily beginning 09/18/2020 -Cablivi  discontinued 09/21/2020  -prednisone  discontinued 09/24/2020   We met patient for consult on 10/12/20. At this time, she was recovering from the relapse in April 2022, and achieved an ADAMTS13 level of  49%.Pt discontinued apheresis in July with [facility]. Pt did well clinically from July-February 2023. On 11/06/20, ADAMTS13 activity was 5.4%. [facility] does not report a BU, but reported a 6 unit/mL ADAMTS13 antibody. PLT 153.  On 12/03/2020, ADAMTS13 acitivty was 2.1%, and a 9 unit/mL ab was reported.  PLT 130.On 12/31/20, ADAMTS13 activity was 2.4%, 7 unit.ml Ab was reported. PLT 114. On 03/12/2021, ADAMTS13 activity was 3.0%, with a 7 unit/mL ab. PLT 114. On 05/03/2021, ADAMTS13 activity was <2% with a 13 unit/mL ab. plt 113. She developed some urinary problems in early 2023. She went in in feburary cystoscopy for urolithiasis, and was diagnosed with a local high-grade urothelial carcinoma. She was treated with intravascular Gemcitabine . Shortly after this procedure, she developed a severe drop in platelet count and a stroke. She then was treated for frank relapse of TTP (Dr. Deanne, [facility])   -plasmapheresis starting 06/28/2021; plasmapheresis 07/05/2021 and 07/07/2021, continue prednisone  40 mg daily -Rituximab  weekly for 4 weeks starting 07/23/2021, 07/29/2021, 08/05/2021, 08/12/2021 -Outpatient plasmapheresis 07/28/2021,08/02/2021, 08/04/2021, 08/06/2021, 08/09/2021, 08/11/2021, 08/13/2021, 08/16/2021, 08/18/2021, 08/20/2021 -Prednisone  60 mg daily, changed to 40 mg daily 08/12/2021, changed to 30 mg daily 08/19/2021 -Cyclosporine planned   ADAMTS13 activity was <2% on 07/15/2021, Antibody was 53 u/mL on 07/15/2021.   ADAMTS13 acitivy was <2% on 08/24/2021, Antibody was 7 units/mL on 08/25/2021.  She returned to us  at [facility] hematology on 09/02/2021. At that time she had recently discontinued apheresis with last session 08/20/21. However,  at that vist she reported feeling increasingly fatigued. Her platelet count on 4/20 was 115, since stopping apheresis her platelet count fell to 86. We recommended admission to hospital to reinstitute apheresis for r/r TTP and transferred care to us .Pt terated apheresis May 2023-December 2023. She has  continued with weekly ADAMTS13 and TIW apheresis. Her levels have been generally limited with continued substantial delta pre/post ADAMTS13 activity levels. She was treated with 2 additional courses of Rituximab , a lengthy course of steroids, to no obvious effect.    Daratumumab  1400mg  IV x 8 weekly doses 03/01/2022-04/19/2022. Well tolerated except for infusion reaction vs. SOB secondary to volume first administration, given lasix  with additional premedication thereafter.   No detectable inhibitor though rest of 2023, but with residual ADAMTS13 levels at high risk of relapse, rasing some suspicion for residual low level activity independent of inhibitor. Pt had no detecteable inhibitor on BU assay however.    After lengthy discussion of next line options, she started ADZYMA 40 IU/KG once every other week on 07/20/2022. She maintained fair ADAMTS13 activity post-infusion but would drop to 3% prior to the infusion. Changed to once-weekly 09/10/2022, with better and more sustained levels. No clincal relapse. Pt with resurgence of detectable inhibitor 09/28/2022 at 6 BU, but maintained level 63-12% though 8/14 with poor post-infusion level, along with a subtle decline in platelet count, but otherwise asymptomatic. Discontinued rADAMTS13, Resumed once-weekly PLEX 12/08/2022.  Maintained 12/08/2022, and generally has done well with this. With recurrent inhibitor, after lengthy discussion of IST vs continued apheresis, restarted Rituxan  at 375mg /m2 weekly x 4 doses on 03/14/2023, 03/21/2023, 03/28/2023, and 04/04/2023 which was generally well tolerated.  Was noted to have reduction in Adams 13 inhibitor from 6 to less than 1.  Patient treated with additional 4 doses of Rituxan  in early 2025 (06/06/2023, 06/13/2023, 06/20/2023, 06/27/2023)  Inhibitor trend: Lab Results  Component Value Date/Time   ADM13I <1 09/17/2023 04:19 PM   ADM13I <1 07/31/2023 11:07 AM   ADM13I 1 (H) 06/05/2023 11:35 AM   ADM13I <1 04/10/2023  12:15 PM   ADM13I 2 (H) 02/27/2023 01:05 PM   Adamts Prepost trends Lab Results  Component Value Date/Time   ADAMTS13PCT 13 (L) 12/11/2023 11:36 AM   ADAMTS13PCT 21 (L) 11/27/2023 11:56 AM   ADAMTS13PCT 25 (L) 11/13/2023 10:56 AM   ADAMTS13PCT 31 (L) 10/30/2023 11:25 AM   ADAMTS13PCT 17 (L) 10/16/2023 12:10 PM   ADAMTS13PCT 15 (L) 10/02/2023 11:17 AM   ADAMTS13PCT 9 (L) 09/26/2023 11:11 AM   ADAMTS13PCT 16 (L) 09/17/2023 04:19 PM   ADAMTS13PCT 12 (L) 09/11/2023 11:08 AM   ADAMTS13PCT 6 (L) 09/04/2023 10:56 AM   ADMSPP 98 08/14/2023 02:24 PM   ADMSPP 99 08/08/2023 02:19 PM   ADMSPP 81 07/31/2023 01:52 PM   ADMSPP 69 07/24/2023 01:51 PM   ADMSPP 93 07/17/2023 01:52 PM   ADMSPP 60 (L) 07/10/2023 01:43 PM   ADMSPP 73 07/03/2023 01:51 PM   ADMSPP 73 06/26/2023 01:14 PM   ADMSPP 67 06/19/2023 02:27 PM   ADMSPP 67 06/12/2023 01:55 PM     Discontinuation of apheresis after 1 year of therapy on 08/14/2023, expectant laboratory monitoring since.     Interval History:  [name] presents to hematology clinic for ongoing follow-up for history of TTP.  She is been doing well since her last communication.  She continues to go to the apheresis unit for labs every 2 weeks and port flushes.  She reports he is doing quite stable at this there  is no concern for any recurrent infection symptomatology or other problems.  No signs or symptoms of TTP resurgence.  Family notes that she is been healthy and has improved her activity level.  She has been come in every 2 weeks alternating which side port gets flushed along with her labs.  Review of Systems - Oncology  Negative unless mentioned above.   Objective  Review Flowsheet  More data exists      11/13/2023 11/27/2023 12/11/2023 12/25/2023  ONCBCN RECENT VITALS  Height - 152 cm - -  Weight - 71.94 kg - -  BSA (Calculated - sq m) - 1.75 m2 - -  Temp 97.6 F (36.4 C) 98 F (36.7 C) 97.9 F (36.6 C) 97.7 F (36.5 C) 98.7 F (37.1 C)  BP  140/75 118/39* 132/39* 126/60 130/58* 161/65*  Heart Rate 75 75 79 93 82  Respiratory Rate 16 16 16 16 16   Oxygen Saturation 95 % 96 % 95 % 94 % 95 %  Oxygen Device None (Room air) - - -    Details       Multiple values from one day are sorted in reverse-chronological order           Physical Exam CONSTITUTIONAL: well developed, well nourished, no acute distress HEENT: NCAT, EOMI NECK: soft and supple, normal ROM CHEST WALL: bilateral ports on right and  Pinpoint scab overlying left chest wall port but base appears clear and no surrounding erythema. CSKIN: warm and dry, no rashes PSYCH: normal mood and affect   Performance Status: 2 - Ambulatory and capable of all selfcare but unable to carry out any work activities.  Up and about more than 50% of waking hours Pain Scale: 0   Recent Results (from the past 2 weeks)  CBC with Differential   Collection Time: 12/25/23 11:49 AM  Result Value Ref Range   WBC 4.80 4.40 - 11.00 10*3/uL   RBC 5.20 (H) 4.10 - 5.10 10*6/uL   Hemoglobin 16.1 (H) 12.3 - 15.3 g/dL   Hematocrit 53.2 (H) 64.0 - 44.6 %   Mean Corpuscular Volume (MCV) 89.8 80.0 - 96.0 fL   Mean Corpuscular Hemoglobin (MCH) 30.9 27.5 - 33.2 pg   Mean Corpuscular Hemoglobin Conc (MCHC) 34.4 33.0 - 37.0 g/dL   Red Cell Distribution Width (RDW) 14.8 12.3 - 17.0 %   Platelet Count (PLT) 128 (L) 150 - 450 10*3/uL   Mean Platelet Volume (MPV) 7.9 6.8 - 10.2 fL   Neutrophils % 48 %   Lymphocytes % 39 %   Monocytes % 10 %   Eosinophils % 3 %   Basophils % 1 %   nRBC % 0 %   Neutrophils Absolute 2.30 1.80 - 7.80 10*3/uL   Lymphocytes # 1.90 1.00 - 4.80 10*3/uL   Monocytes # 0.50 0.00 - 0.80 10*3/uL   Eosinophils # 0.10 0.00 - 0.50 10*3/uL   Basophils # 0.00 0.00 - 0.20 10*3/uL   nRBC Absolute 0.00 <=0.00 10*3/uL    Assessment/Plan  # Relapse refractory TTP - Currently not in frank hematologic relapse, but remains at high risk of such. Pt with last frank hematologic  relapse in 2022, in effective biochemical/low ADAMTS relapse at present mitigated by PLEX.  The patient has a complex history of autoimmune TTP diagnosed in 2005, characterized by multiple relapses requiring intensive treatment, including plasma exchange, steroids, rituximab , and maintenance therapy with azathioprine . Despite achieving normal ADAMTS13 levels in the past, she has experienced several relapses, including a severe  episode in March 2022 with ADAMTS13 activity less than 2%, managed with plasmapheresis, steroids, rituximab , and caplacizumab . Although she achieved partial recovery by mid-2022, her ADAMTS13 levels remained low, leading to another relapse in early 2023, coinciding with a stroke following treatment for high-grade urothelial carcinoma. Treatment during this period included plasmapheresis, rituximab , prednisone , and planned cyclosporine, yet she continued to exhibit fluctuating ADAMTS13 activity with ongoing relapse risk. In late 2023, she received eight weekly doses of daratumumab, followed by initiation of ADZYMA in March 2024. Despite these interventions, she remains at high risk for relapse. With decline of levels after Adzyma and reappearance of inhibitor, consensus was we would have less benefit of adzyma in reducing her risk of relapse instead transition back to apheresis for immunomodulatory effect and presumed antibody removal along with adamts13 replacement. Pt has done fairly well with this clincally, but still remains the question on if we can do something to lessen the burdens of disease instead of PLEX indefinitely. Have considered benlysta but denied by insurance.  Partial response to the Nov Rituxan , return of a 1BU inhibitor followed by an additional 4 doses of R.  She has been maintained on apheresis qweekly effectively since loss of response to Adzynma and low ADAMTS13 levels in mid April 2024, then stopped. Been following labs since.   12/25/2023: Follow-up for history  of TTP with significant history as above.  Clinically doing well off a pheresis and all immunosuppressive therapy since April 2025.  She has been in a hematologic remission with chronic mild thrombocytopenia but no evidence of any microangiopathic change or anemia or any other suggestion of active TTP.  She probably has some degree of chronic thrombocytopenia related to her comorbidities we will need to always account for and consider when evaluate the potential for any TTP.  Certainly she has high risk of any TTP relapse or MCS 13 remains fairly low despite all the therapy as above but she has been clinically stable at this level and somewhat more comfortable with levels in the 20% range compared to undetectable ADAMTS13 levels when she tends to have functional relapse.  Given the stability over the past several months, we will continue close follow-up but extend labs to q. monthly with port flushes and see her back after 3 rounds.  She will get her labs and port flush in apheresis.  Scheduled Future Appointments       Provider Department Dept Phone Center   04/01/2024 11:30 AM Bernardino Ill [facility] [place] [facility] Hematology Oncology [phone] [facility]          Electronically signed by: Bernardino Ill, MD 12/25/2023 11:57 AM     *Some images could not be shown.

## 2024-02-05 NOTE — Progress Notes (Signed)
 [name] (MRN [id number]) was seen in the apheresis clinic today for central line care. The bilateral vortex port, power flow After treatment, lumens were deaccessed per policy. was completed without difficulty.   [name] taken via wheelchair from apheresis treatment area with family to go home.

## 2024-02-06 ENCOUNTER — Emergency Department (HOSPITAL_COMMUNITY)

## 2024-02-06 ENCOUNTER — Inpatient Hospital Stay (HOSPITAL_COMMUNITY)
Admission: EM | Admit: 2024-02-06 | Discharge: 2024-02-09 | DRG: 871 | Disposition: A | Discharge status: Home / Self Care | Attending: Internal Medicine | Admitting: Internal Medicine

## 2024-02-06 ENCOUNTER — Other Ambulatory Visit: Payer: Self-pay

## 2024-02-06 ENCOUNTER — Encounter (HOSPITAL_COMMUNITY): Payer: Self-pay

## 2024-02-06 DIAGNOSIS — Z87442 Personal history of urinary calculi: Secondary | ICD-10-CM

## 2024-02-06 DIAGNOSIS — Z888 Allergy status to other drugs, medicaments and biological substances status: Secondary | ICD-10-CM

## 2024-02-06 DIAGNOSIS — Z8249 Family history of ischemic heart disease and other diseases of the circulatory system: Secondary | ICD-10-CM

## 2024-02-06 DIAGNOSIS — F39 Unspecified mood [affective] disorder: Secondary | ICD-10-CM | POA: Diagnosis not present

## 2024-02-06 DIAGNOSIS — R6521 Severe sepsis with septic shock: Secondary | ICD-10-CM | POA: Diagnosis present

## 2024-02-06 DIAGNOSIS — A419 Sepsis, unspecified organism: Secondary | ICD-10-CM | POA: Diagnosis present

## 2024-02-06 DIAGNOSIS — J4489 Other specified chronic obstructive pulmonary disease: Secondary | ICD-10-CM | POA: Diagnosis present

## 2024-02-06 DIAGNOSIS — D696 Thrombocytopenia, unspecified: Secondary | ICD-10-CM

## 2024-02-06 DIAGNOSIS — G9341 Metabolic encephalopathy: Secondary | ICD-10-CM | POA: Diagnosis present

## 2024-02-06 DIAGNOSIS — F32A Depression, unspecified: Secondary | ICD-10-CM | POA: Diagnosis present

## 2024-02-06 DIAGNOSIS — I129 Hypertensive chronic kidney disease with stage 1 through stage 4 chronic kidney disease, or unspecified chronic kidney disease: Secondary | ICD-10-CM | POA: Diagnosis present

## 2024-02-06 DIAGNOSIS — J9601 Acute respiratory failure with hypoxia: Secondary | ICD-10-CM | POA: Diagnosis present

## 2024-02-06 DIAGNOSIS — Z79899 Other long term (current) drug therapy: Secondary | ICD-10-CM | POA: Diagnosis not present

## 2024-02-06 DIAGNOSIS — M3119 Other thrombotic microangiopathy: Secondary | ICD-10-CM | POA: Diagnosis present

## 2024-02-06 DIAGNOSIS — R509 Fever, unspecified: Secondary | ICD-10-CM

## 2024-02-06 DIAGNOSIS — E785 Hyperlipidemia, unspecified: Secondary | ICD-10-CM | POA: Diagnosis present

## 2024-02-06 DIAGNOSIS — E861 Hypovolemia: Secondary | ICD-10-CM | POA: Diagnosis present

## 2024-02-06 DIAGNOSIS — Z882 Allergy status to sulfonamides status: Secondary | ICD-10-CM

## 2024-02-06 DIAGNOSIS — Z806 Family history of leukemia: Secondary | ICD-10-CM

## 2024-02-06 DIAGNOSIS — L299 Pruritus, unspecified: Secondary | ICD-10-CM | POA: Diagnosis present

## 2024-02-06 DIAGNOSIS — R571 Hypovolemic shock: Secondary | ICD-10-CM | POA: Diagnosis present

## 2024-02-06 DIAGNOSIS — F419 Anxiety disorder, unspecified: Secondary | ICD-10-CM | POA: Diagnosis present

## 2024-02-06 DIAGNOSIS — Z1152 Encounter for screening for COVID-19: Secondary | ICD-10-CM

## 2024-02-06 DIAGNOSIS — G4700 Insomnia, unspecified: Secondary | ICD-10-CM | POA: Diagnosis present

## 2024-02-06 DIAGNOSIS — Z91013 Allergy to seafood: Secondary | ICD-10-CM

## 2024-02-06 DIAGNOSIS — F17210 Nicotine dependence, cigarettes, uncomplicated: Secondary | ICD-10-CM | POA: Diagnosis present

## 2024-02-06 DIAGNOSIS — R579 Shock, unspecified: Secondary | ICD-10-CM | POA: Diagnosis not present

## 2024-02-06 DIAGNOSIS — N179 Acute kidney failure, unspecified: Secondary | ICD-10-CM | POA: Diagnosis present

## 2024-02-06 DIAGNOSIS — N1832 Chronic kidney disease, stage 3b: Secondary | ICD-10-CM | POA: Diagnosis present

## 2024-02-06 DIAGNOSIS — Z880 Allergy status to penicillin: Secondary | ICD-10-CM

## 2024-02-06 DIAGNOSIS — Z91048 Other nonmedicinal substance allergy status: Secondary | ICD-10-CM

## 2024-02-06 DIAGNOSIS — E860 Dehydration: Secondary | ICD-10-CM | POA: Diagnosis present

## 2024-02-06 DIAGNOSIS — J449 Chronic obstructive pulmonary disease, unspecified: Secondary | ICD-10-CM | POA: Diagnosis not present

## 2024-02-06 LAB — RESP PANEL BY RT-PCR (RSV, FLU A&B, COVID)  RVPGX2
Influenza A by PCR: NEGATIVE
Influenza B by PCR: NEGATIVE
Resp Syncytial Virus by PCR: NEGATIVE
SARS Coronavirus 2 by RT PCR: NEGATIVE

## 2024-02-06 LAB — CBC WITH DIFFERENTIAL/PLATELET
Abs Immature Granulocytes: 0.01 K/uL (ref 0.00–0.07)
Basophils Absolute: 0 K/uL (ref 0.0–0.1)
Basophils Relative: 1 %
Eosinophils Absolute: 0 K/uL (ref 0.0–0.5)
Eosinophils Relative: 1 %
HCT: 48 % — ABNORMAL HIGH (ref 36.0–46.0)
Hemoglobin: 15.9 g/dL — ABNORMAL HIGH (ref 12.0–15.0)
Immature Granulocytes: 0 %
Lymphocytes Relative: 25 %
Lymphs Abs: 1.9 K/uL (ref 0.7–4.0)
MCH: 30.9 pg (ref 26.0–34.0)
MCHC: 33.1 g/dL (ref 30.0–36.0)
MCV: 93.4 fL (ref 80.0–100.0)
Monocytes Absolute: 0.7 K/uL (ref 0.1–1.0)
Monocytes Relative: 10 %
Neutro Abs: 4.7 K/uL (ref 1.7–7.7)
Neutrophils Relative %: 63 %
Platelets: 101 K/uL — ABNORMAL LOW (ref 150–400)
RBC: 5.14 MIL/uL — ABNORMAL HIGH (ref 3.87–5.11)
RDW: 13.6 % (ref 11.5–15.5)
WBC: 7.4 K/uL (ref 4.0–10.5)
nRBC: 0 % (ref 0.0–0.2)

## 2024-02-06 LAB — URINALYSIS, W/ REFLEX TO CULTURE (INFECTION SUSPECTED)
Bacteria, UA: NONE SEEN
Bilirubin Urine: NEGATIVE
Glucose, UA: NEGATIVE mg/dL
Hgb urine dipstick: NEGATIVE
Ketones, ur: NEGATIVE mg/dL
Nitrite: NEGATIVE
Protein, ur: NEGATIVE mg/dL
Specific Gravity, Urine: 1.015 (ref 1.005–1.030)
pH: 6 (ref 5.0–8.0)

## 2024-02-06 LAB — COMPREHENSIVE METABOLIC PANEL WITH GFR
ALT: 14 U/L (ref 0–44)
AST: 21 U/L (ref 15–41)
Albumin: 3.5 g/dL (ref 3.5–5.0)
Alkaline Phosphatase: 38 U/L (ref 38–126)
Anion gap: 7 (ref 5–15)
BUN: 16 mg/dL (ref 8–23)
CO2: 24 mmol/L (ref 22–32)
Calcium: 8.7 mg/dL — ABNORMAL LOW (ref 8.9–10.3)
Chloride: 106 mmol/L (ref 98–111)
Creatinine, Ser: 1.52 mg/dL — ABNORMAL HIGH (ref 0.44–1.00)
GFR, Estimated: 34 mL/min — ABNORMAL LOW (ref >60)
Glucose, Bld: 114 mg/dL — ABNORMAL HIGH (ref 70–99)
Potassium: 4.2 mmol/L (ref 3.5–5.1)
Sodium: 137 mmol/L (ref 135–145)
Total Bilirubin: 1.1 mg/dL (ref 0.0–1.2)
Total Protein: 5.6 g/dL — ABNORMAL LOW (ref 6.5–8.1)

## 2024-02-06 LAB — CBC
HCT: 43.7 % (ref 36.0–46.0)
Hemoglobin: 14.1 g/dL (ref 12.0–15.0)
MCH: 30.7 pg (ref 26.0–34.0)
MCHC: 32.3 g/dL (ref 30.0–36.0)
MCV: 95 fL (ref 80.0–100.0)
Platelets: 91 K/uL — ABNORMAL LOW (ref 150–400)
RBC: 4.6 MIL/uL (ref 3.87–5.11)
RDW: 13.8 % (ref 11.5–15.5)
WBC: 7.3 K/uL (ref 4.0–10.5)
nRBC: 0 % (ref 0.0–0.2)

## 2024-02-06 LAB — DIFFERENTIAL
Abs Immature Granulocytes: 0.02 K/uL (ref 0.00–0.07)
Basophils Absolute: 0 K/uL (ref 0.0–0.1)
Basophils Relative: 1 %
Eosinophils Absolute: 0.1 K/uL (ref 0.0–0.5)
Eosinophils Relative: 1 %
Immature Granulocytes: 0 %
Lymphocytes Relative: 29 %
Lymphs Abs: 2.1 K/uL (ref 0.7–4.0)
Monocytes Absolute: 1 K/uL (ref 0.1–1.0)
Monocytes Relative: 13 %
Neutro Abs: 4.1 K/uL (ref 1.7–7.7)
Neutrophils Relative %: 56 %

## 2024-02-06 LAB — GLUCOSE, CAPILLARY: Glucose-Capillary: 123 mg/dL — ABNORMAL HIGH (ref 70–99)

## 2024-02-06 LAB — LACTATE DEHYDROGENASE: LDH: 141 U/L (ref 98–192)

## 2024-02-06 LAB — PROCALCITONIN: Procalcitonin: 0.34 ng/mL

## 2024-02-06 LAB — MRSA NEXT GEN BY PCR, NASAL: MRSA by PCR Next Gen: NOT DETECTED

## 2024-02-06 LAB — I-STAT CG4 LACTIC ACID, ED: Lactic Acid, Venous: 1 mmol/L (ref 0.5–1.9)

## 2024-02-06 MED ORDER — HEPARIN SODIUM (PORCINE) 5000 UNIT/ML IJ SOLN
5000.0000 [IU] | Freq: Three times a day (TID) | INTRAMUSCULAR | Status: DC
  Administered 2024-02-06 – 2024-02-07 (×4): 5000 [IU] via SUBCUTANEOUS
  Filled 2024-02-06 (×3): qty 1

## 2024-02-06 MED ORDER — NOREPINEPHRINE 4 MG/250ML-% IV SOLN
0.0000 ug/min | INTRAVENOUS | Status: DC
  Administered 2024-02-06: 2 ug/min via INTRAVENOUS
  Filled 2024-02-06: qty 250

## 2024-02-06 MED ORDER — SODIUM CHLORIDE 0.9 % IV SOLN: 250.0000 mL | INTRAVENOUS | Status: DC

## 2024-02-06 MED ORDER — VANCOMYCIN HCL IN DEXTROSE 1-5 GM/200ML-% IV SOLN: 1000.0000 mg | INTRAVENOUS | Status: DC

## 2024-02-06 MED ORDER — ADULT MULTIVITAMIN W/MINERALS CH
1.0000 | ORAL_TABLET | Freq: Every day | ORAL | Status: DC
  Administered 2024-02-06 – 2024-02-09 (×4): 1 via ORAL
  Filled 2024-02-06 (×4): qty 1

## 2024-02-06 MED ORDER — QUETIAPINE FUMARATE 25 MG PO TABS
25.0000 mg | ORAL_TABLET | Freq: Every day | ORAL | Status: DC
  Administered 2024-02-06 – 2024-02-08 (×3): 25 mg via ORAL
  Filled 2024-02-06 (×3): qty 1

## 2024-02-06 MED ORDER — CHLORHEXIDINE GLUCONATE CLOTH 2 % EX PADS
6.0000 | MEDICATED_PAD | Freq: Every day | CUTANEOUS | Status: DC
  Administered 2024-02-06 – 2024-02-09 (×3): 6 via TOPICAL

## 2024-02-06 MED ORDER — LACTATED RINGERS IV BOLUS
1000.0000 mL | Freq: Once | INTRAVENOUS | Status: AC
  Administered 2024-02-06: 1000 mL via INTRAVENOUS

## 2024-02-06 MED ORDER — LATANOPROST 0.005 % OP SOLN
1.0000 [drp] | Freq: Every day | OPHTHALMIC | Status: DC
  Administered 2024-02-06 – 2024-02-08 (×3): 1 [drp] via OPHTHALMIC
  Filled 2024-02-06: qty 2.5

## 2024-02-06 MED ORDER — SODIUM CHLORIDE 0.9 % IV BOLUS
1000.0000 mL | Freq: Once | INTRAVENOUS | Status: AC
  Administered 2024-02-06: 1000 mL via INTRAVENOUS

## 2024-02-06 MED ORDER — HYDROXYZINE HCL 25 MG PO TABS
25.0000 mg | ORAL_TABLET | ORAL | Status: DC | PRN
Start: 2024-02-06 — End: 2024-02-09

## 2024-02-06 MED ORDER — ACETAMINOPHEN 325 MG PO TABS
650.0000 mg | ORAL_TABLET | Freq: Once | ORAL | Status: AC
  Administered 2024-02-06: 650 mg via ORAL
  Filled 2024-02-06: qty 2

## 2024-02-06 MED ORDER — FLUOXETINE HCL 20 MG PO CAPS
20.0000 mg | ORAL_CAPSULE | Freq: Every day | ORAL | Status: DC
Start: 2024-02-06 — End: 2024-02-09
  Administered 2024-02-06 – 2024-02-09 (×4): 20 mg via ORAL
  Filled 2024-02-06: qty 1
  Filled 2024-02-06 (×2): qty 2
  Filled 2024-02-06: qty 1

## 2024-02-06 MED ORDER — ORAL CARE MOUTH RINSE: 15.0000 mL | OROMUCOSAL | Status: DC | PRN

## 2024-02-06 MED ORDER — VANCOMYCIN HCL 1500 MG/300ML IV SOLN
1500.0000 mg | Freq: Once | INTRAVENOUS | Status: AC
  Administered 2024-02-06: 1500 mg via INTRAVENOUS
  Filled 2024-02-06: qty 300

## 2024-02-06 MED ORDER — NOREPINEPHRINE 4 MG/250ML-% IV SOLN: 0.0000 ug/min | INTRAVENOUS | Status: DC

## 2024-02-06 MED ORDER — POLYETHYLENE GLYCOL 3350 17 G PO PACK: 17.0000 g | PACK | Freq: Every day | ORAL | Status: DC | PRN

## 2024-02-06 MED ORDER — FOLIC ACID 1 MG PO TABS
1.0000 mg | ORAL_TABLET | Freq: Every day | ORAL | Status: DC
  Administered 2024-02-06 – 2024-02-09 (×4): 1 mg via ORAL
  Filled 2024-02-06 (×4): qty 1

## 2024-02-06 MED ORDER — PIPERACILLIN-TAZOBACTAM 3.375 G IVPB
3.3750 g | Freq: Three times a day (TID) | INTRAVENOUS | Status: DC
  Administered 2024-02-06 – 2024-02-09 (×9): 3.375 g via INTRAVENOUS
  Filled 2024-02-06 (×9): qty 50

## 2024-02-06 MED ORDER — DOCUSATE SODIUM 100 MG PO CAPS: 100.0000 mg | ORAL_CAPSULE | Freq: Two times a day (BID) | ORAL | Status: DC | PRN

## 2024-02-06 MED ORDER — SODIUM CHLORIDE 0.9 % IV SOLN
2.0000 g | Freq: Once | INTRAVENOUS | Status: AC
  Administered 2024-02-06: 2 g via INTRAVENOUS
  Filled 2024-02-06: qty 12.5

## 2024-02-06 MED ORDER — SODIUM CHLORIDE 0.9 % IV BOLUS
1000.0000 mL | Freq: Once | INTRAVENOUS | Status: AC
Start: 2024-02-06 — End: 2024-02-06
  Administered 2024-02-06: 1000 mL via INTRAVENOUS

## 2024-02-06 MED ORDER — ROSUVASTATIN CALCIUM 20 MG PO TABS
20.0000 mg | ORAL_TABLET | Freq: Every day | ORAL | Status: DC
  Administered 2024-02-06 – 2024-02-09 (×4): 20 mg via ORAL
  Filled 2024-02-06 (×4): qty 1

## 2024-02-06 MED ORDER — HYDROCORTISONE 1 % EX CREA
1.0000 | TOPICAL_CREAM | Freq: Two times a day (BID) | CUTANEOUS | Status: DC
Start: 2024-02-06 — End: 2024-02-09
  Administered 2024-02-07 – 2024-02-09 (×3): 1 via TOPICAL
  Filled 2024-02-06: qty 28

## 2024-02-06 MED ORDER — GABAPENTIN 300 MG PO CAPS
300.0000 mg | ORAL_CAPSULE | Freq: Every day | ORAL | Status: DC
  Administered 2024-02-06 – 2024-02-08 (×3): 300 mg via ORAL
  Filled 2024-02-06 (×3): qty 1

## 2024-02-06 MED ORDER — REVEFENACIN 175 MCG/3ML IN SOLN
175.0000 ug | Freq: Every day | RESPIRATORY_TRACT | Status: DC
  Administered 2024-02-06 – 2024-02-09 (×3): 175 ug via RESPIRATORY_TRACT
  Filled 2024-02-06 (×5): qty 3

## 2024-02-06 MED ORDER — VANCOMYCIN HCL IN DEXTROSE 1-5 GM/200ML-% IV SOLN
1000.0000 mg | INTRAVENOUS | Status: DC
Start: 2024-02-08 — End: 2024-02-07

## 2024-02-06 MED ORDER — LACTATED RINGERS IV SOLN: INTRAVENOUS | Status: DC

## 2024-02-06 NOTE — Progress Notes (Signed)
 ED Pharmacy Antibiotic Sign Off An antibiotic consult was received from an ED provider for Vancomycin  and Cefepime   per pharmacy dosing for sepsis. A chart review was completed to assess appropriateness.   The following one time order(s) were placed:  Vancomycin  1500 mg IV Cefepime  2 g IV  Further antibiotic and/or antibiotic pharmacy consults should be ordered by the admitting provider if indicated.    Dail Cordella Misty, [facility]  Clinical Pharmacist 02/06/24 5:49 AM

## 2024-02-06 NOTE — Progress Notes (Addendum)
 Pharmacy Antibiotic Note  [name] is a 83 y.o. female admitted on 02/06/2024 with septic shock .  Pharmacy has been consulted for vancomycin  / zosyn  dosing. Tmax 101.8, wbc 7.4, L acid 1.0, Norepi @ 7  Scr 1.52 (baseline ~ 1.3)   Plan: Zosyn  3.375g IV q8h (4 hour infusion). Vancomycin  1000 mg q8h for AUC of 497 (Vd 0.5)  Follow MRSA PCR, cultures, renal function, levels as needed   Height: 5' 4 (162.6 cm) Weight: 79.8 kg (176 lb) (per pt) IBW/kg (Calculated) : 54.7  Temp (24hrs), Avg:100.3 F (37.9 C), Min:98.7 F (37.1 C), Max:101.8 F (38.8 C)  Recent Labs  Lab 02/06/24 0425 02/06/24 0434  WBC 7.4  --   CREATININE 1.52*  --   LATICACIDVEN  --  1.0    Estimated Creatinine Clearance: 29.6 mL/min (A) (by C-G formula based on SCr of 1.52 mg/dL (H)).    Allergies  Allergen Reactions   Adhesive [Tape] Other (See Comments)    Blisters, Band-Aids brand rips off the skin- paper tape only!!   Cefuroxime  Axetil Anxiety and Other (See Comments)    Made the patient jittery   Cefuroxime  Axetil Anxiety and Other (See Comments)    Shaky, amoxicillin ok Made the patient jittery   Pedi-Pre Tape Spray [Wound Dressing Adhesive] Other (See Comments)    Blisters, band-aide brand  Blisters, Band-Aids brand rips off the skin- paper tape only!!   Shellfish Allergy Anaphylaxis   Sulfa Antibiotics Anaphylaxis   Sulfa Drugs Cross Reactors Anaphylaxis   Other Hives and Other (See Comments)    Wool: Reaction is hives [employer] tape: Reaction is blistering    Amoxicillin-Pot Clavulanate     Antimicrobials this admission: Vancomycin  10/7 >> c Cefepime  10/7 x1  Zosyn  10/7 >> c  Microbiology results: 10/7 BCx: pending  10/7  MRSA PCR: pending   Thank you for allowing pharmacy to be a part of this patient's care.  Rankin Sams 02/06/2024 9:55 AM

## 2024-02-06 NOTE — ED Triage Notes (Signed)
 PT BIB EMS from home. EMS reports family reported pt is febrile and decreased mobility.A&Ox4. pt has no complaints at this time  Hx of ttp and currently being treated. COPD  T 102.9, cbg 94, HR 84, 88% RA, 91% 2L [place], 98/64 18g iv R hand EMS admin 500ml NS, 10mg  iv tylenol 

## 2024-02-06 NOTE — Plan of Care (Signed)
   Problem: Education: Goal: Knowledge of General Education information will improve Description Including pain rating scale, medication(s)/side effects and non-pharmacologic comfort measures Outcome: Progressing   Problem: Health Behavior/Discharge Planning: Goal: Ability to manage health-related needs will improve Outcome: Progressing

## 2024-02-06 NOTE — H&P (Signed)
 NAME:  [name], MRN:  [id number], DOB:  Dec 26, 1940, LOS: 0 ADMISSION DATE:  02/06/2024, CONSULTATION DATE:  02/06/24 REFERRING MD:  Bari CHIEF COMPLAINT:  Weakness, AMS   History of Present Illness:  [name] is a 83 y.o. 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  She was initially diagnosed in 2005 and has had multiple relapses in the past and has been treated with multiple agents.  Per her last note from [facility] hematology on 12/25/2023, she had been doing well off apheresis and all immunosuppressive therapy since April 2025 and had been in hematologic remission with chronic mild thrombocytopenia but no evidence of any microangiopathic changes to suggest active TTP.  Recommendations were to continue close follow-up but extend labs to once a month with port flushes and see her back after 3 round.  She presented to [facility] ED on 02/05/2024 with AMS, weakness, fever to 102.9.  She had apparently been in her usual state of health and had actually seen [facility] for routine labs earlier that day.  Following her appointment while in the car on the way home, she felt like she was becoming feverish.  Later on at home she had generalized weakness.  She went to bed and apparently the middle of the night try to get up to use the restroom but was unable to do so due to weakness.  She also appeared somewhat confused at the time.  Family subsequently called EMS who brought her to [facility] ED.  On arrival to ED, she was noted to be hypotensive with SBP in the 80s and hypoxic requiring 3 L nasal cannula to maintain sats in the 90s.  She received 3 L of normal saline and had minimal improvement in her blood pressure; therefore, she was started on a norepinephrine infusion.  EDP discussed the case with [facility] transfer center and patient was accepted and transferred there; however, there were no beds available at their facility.  PCCM at [facility] subsequently called for  ICU admission.  We inquired about ED to ED transfer given limited bed availability at [facility]; however, this was not possible.  She is therefore being admitted to our facility.  Of note, she had a very similar episode to this in May 2025 felt to be due to sepsis from her indwelling ports (1 port on each side of her chest).  She was discharged on vancomycin  for several days per ID recommendations.  It was not felt that her port needed to be removed.  Pertinent  Medical History:  has Epidural abscess; Sepsis (HCC); History of TTP (thrombotic thrombocytopenic purpura); COPD exacerbation (HCC); Enterococcal sepsis (HCC); Endocarditis, bacterial, acute/subacute; Gait instability; Tobacco use disorder; TTP (thrombotic thrombocytopenic purpura); Oral thrush; Hypokalemia; History of plasmapheresis; Bronchitis, chronic obstructive w acute bronchitis (HCC); Fever; Pulmonary edema; Abnormal laboratory test; Chronic pain disorder; Mood disorder; Acute blood loss anemia; Adnexal cyst; Aortic atherosclerosis; Acute pyelonephritis; Nicotine  dependence; Nephrolithiasis; Acute left-sided weakness; AKI (acute kidney injury); T.T.P. syndrome (HCC); Anaphylactic reaction , suspect shellfish (shrimp), initial encounter; Anaphylactic shock; Chronic kidney disease, stage 3a (HCC); Chronic back pain; Essential hypertension; Acute respiratory failure with hypoxia (HCC); Elevated troponin; Anaphylactic reaction; and Shock (HCC) on their problem list.  Significant Hospital Events: Including procedures, antibiotic start and stop dates in addition to other pertinent events   10/7 admit.  Interim History / Subjective:  On 6 NE with SBP mid 90s to low 100s. Appears a bit  dry still. On 3L [place] with sats 92- 94%.  Objective:  Blood pressure (!) 114/53, pulse 71, temperature 98.7 F (37.1 C), temperature source Oral, resp. rate 20, height 5' 4 (1.626 m), weight 79.8 kg, SpO2 92%.        Intake/Output Summary (Last 24 hours) at  02/06/2024 0954 Last data filed at 02/06/2024 0706 Gross per 24 hour  Intake 11.63 ml  Output --  Net 11.63 ml   Filed Weights   02/06/24 0412  Weight: 79.8 kg     Physical Exam: General: Adult female, resting in bed, in NAD. Neuro: Awake but a bit sleepy and confused on city and hospital. Able to tell me her name and what year it is. HEENT: [place]/AT. Sclerae anicteric. MM dry. Cardiovascular: RRR, no M/R/G. Bilateral subcutaneous ports without any surrounding erythema and no drainage, non-tender. Lungs: Respirations even and unlabored.  CTA bilaterally, No W/R/R. Abdomen: Obese. BS x 4, soft, NT/ND.  Musculoskeletal: No gross deformities, no edema.   Assessment & Plan:   Shock - concern sepsis with fever and AMS and similar presentation to her admission back in May; however, not convincing. Favor a component of hypovolemia too. Lactate is reassuring. - Additional 1L fluids LR now then MIVF at 125/hr. - Continue NE low dose, wean as able. Goal MAP > 65. - Add on PCT. - Empiric Vanc/Zosyn  for now. - Follow cultures.  Acute hypoxic respiratory failure - concern a degree hypoventilation with AMS. No hx to support aspiration or PNA. Hx COPD per report. - Continue supplemental O2 as needed to maintain SpO2 > 92%. - Empiric abx as above. - Continue PTA Spiriva . - CXR intermittently.  Hx TTP - hx of multiple relapses though per her last note from [facility] hematology on 12/25/2023, she had been doing well off apheresis and all immunosuppressive therapy since April 2025 and had been in hematologic remission with chronic mild thrombocytopenia but no evidence of any microangiopathic changes to suggest active TTP. - She has been accepted to [facility] once a bed becomes available. - Check peripheral smear. - Check ADAMTS13 for completeness and to compare from prior (was 10 from labs on 01/08/24).  Chronic thrombocytopenia - 2/2 above. - Monitor.  Hx anxiety, depression. - Continue PTA Fluoxetine ,  Quietiapine.   Admit to MICU here at [facility]. Awaiting bed availability at [facility].  Labs   CBC: Recent Labs  Lab 02/06/24 0425  WBC 7.4  NEUTROABS 4.7  HGB 15.9*  HCT 48.0*  MCV 93.4  PLT 101*    Basic Metabolic Panel: Recent Labs  Lab 02/06/24 0425  NA 137  K 4.2  CL 106  CO2 24  GLUCOSE 114*  BUN 16  CREATININE 1.52*  CALCIUM  8.7*   GFR: Estimated Creatinine Clearance: 29.6 mL/min (A) (by C-G formula based on SCr of 1.52 mg/dL (H)). Recent Labs  Lab 02/06/24 0425 02/06/24 0434  WBC 7.4  --   LATICACIDVEN  --  1.0    Liver Function Tests: Recent Labs  Lab 02/06/24 0425  AST 21  ALT 14  ALKPHOS 38  BILITOT 1.1  PROT 5.6*  ALBUMIN 3.5   No results for input(s): LIPASE, AMYLASE in the last 168 hours. No results for input(s): AMMONIA in the last 168 hours.  ABG    Component Value Date/Time   PHART 7.38 10/08/2021 2339   PCO2ART 61 (H) 10/08/2021 2339   PO2ART 31 (LL) 10/08/2021 2339   HCO3 36.1 (H) 10/08/2021 2339   TCO2 22 08/29/2013 1423  ACIDBASEDEF 3.0 (H) 08/19/2013 1546   O2SAT 44.8 10/08/2021 2339     Coagulation Profile: No results for input(s): INR, PROTIME in the last 168 hours.  Cardiac Enzymes: No results for input(s): CKTOTAL, CKMB, CKMBINDEX, TROPONINI in the last 168 hours.  HbA1C: Hemoglobin A1C  Date/Time Value Ref Range Status  09/14/2012 05:44 AM 5.4 4.2 - 6.3 % Final    Comment:    The American Diabetes Association recommends that a primary goal of therapy should be <7% and that physicians should reevaluate the treatment regimen in patients with HbA1c values consistently >8%.    Hgb A1c MFr Bld  Date/Time Value Ref Range Status  06/20/2021 06:08 AM 5.3 4.8 - 5.6 % Final    Comment:    (NOTE) Pre diabetes:          5.7%-6.4%  Diabetes:              >6.4%  Glycemic control for   <7.0% adults with diabetes   08/20/2013 01:35 PM 5.8 (H) <5.7 % Final    Comment:    (NOTE)                                                                        According to the ADA Clinical Practice Recommendations for 2011, when HbA1c is used as a screening test:  >=6.5%   Diagnostic of Diabetes Mellitus           (if abnormal result is confirmed) 5.7-6.4%   Increased risk of developing Diabetes Mellitus References:Diagnosis and Classification of Diabetes Mellitus,Diabetes Care,2011,34(Suppl 1):S62-S69 and Standards of Medical Care in         Diabetes - 2011,Diabetes Care,2011,34 (Suppl 1):S11-S61.    CBG: No results for input(s): GLUCAP in the last 168 hours.  Review of Systems:   All negative; except for those that are bolded, which indicate positives.  Constitutional: weight loss, weight gain, night sweats, fevers, chills, fatigue, weakness.  HEENT: headaches, sore throat, sneezing, nasal congestion, post nasal drip, difficulty swallowing, tooth/dental problems, visual complaints, visual changes, ear aches. Neuro: difficulty with speech, weakness, numbness, ataxia, confusion. CV:  chest pain, orthopnea, PND, swelling in lower extremities, dizziness, palpitations, syncope.  Resp: cough, hemoptysis, dyspnea, wheezing. GI: heartburn, indigestion, abdominal pain, nausea, vomiting, diarrhea, constipation, change in bowel habits, loss of appetite, hematemesis, melena, hematochezia.  GU: dysuria, change in color of urine, urgency or frequency, flank pain, hematuria. MSK: joint pain or swelling, decreased range of motion. Psych: change in mood or affect, depression, anxiety, suicidal ideations, homicidal ideations. Skin: rash, itching, bruising.   Past Medical History:  She,  has a past medical history of Abnormal laboratory test (09/10/2013), Acute delirium, Adnexal cyst (09/25/2020), Anemia, Arthritis, Bacteremia (03/2011), Blood dyscrasia, Blood transfusion, Bronchitis, chronic obstructive w acute bronchitis (HCC) (08/12/2013), Chronic back pain, COPD (chronic obstructive pulmonary disease) (HCC),  Depression, Endocarditis, Epidural abscess, Heart murmur, History of plasmapheresis (08/08/2013), History of TTP (thrombotic thrombocytopenic purpura), Hypokalemia (08/08/2013), Normal echocardiogram (03/15/2011), Oral thrush (08/08/2013), Septicemia (03/2011), Spinal stenosis, lumbar, and Tobacco abuse (05/10/2013).   Surgical History:   Past Surgical History:  Procedure Laterality Date   BACK SURGERY     BLADDER INSTILLATION N/A 06/18/2021   Procedure: BLADDER INSTILLATION OF GEMCITABINE ;  Surgeon:  Francisca Redell BROCKS, MD;  Location: [facility] ORS;  Service: Urology;  Laterality: N/A;   CATARACT EXTRACTION W/ INTRAOCULAR LENS  IMPLANT, BILATERAL  ~ 2010   CESAREAN SECTION  10/09/1976   CYSTOSCOPY/URETEROSCOPY/HOLMIUM LASER/STENT PLACEMENT Right 06/18/2021   Procedure: CYSTOSCOPY/URETEROSCOPY/HOLMIUM LASER/STENT PLACEMENT;  Surgeon: Francisca Redell BROCKS, MD;  Location: [facility] ORS;  Service: Urology;  Laterality: Right;   ESOPHAGOGASTRODUODENOSCOPY (EGD) WITH PROPOFOL  N/A 09/27/2020   Procedure: ESOPHAGOGASTRODUODENOSCOPY (EGD) WITH PROPOFOL ;  Surgeon: Donnald Charleston, MD;  Location: [facility] ENDOSCOPY;  Service: Endoscopy;  Laterality: N/A;   EYE SURGERY     INSERTION OF DIALYSIS CATHETER Left 07/12/2013   Procedure: INSERTION OF DIALYSIS CATHETER   with Ultrasound;  Surgeon: Krystal JULIANNA Doing, MD;  Location: [facility] OR;  Service: Vascular;  Laterality: Left;   IR FLUORO GUIDE CV LINE LEFT  06/20/2021   IR FLUORO GUIDE CV LINE LEFT  06/25/2021   IR PERC TUN PERIT CATH WO PORT S&I /IMAG  07/16/2020   IR RADIOLOGIST EVAL & MGMT  09/26/2020   IR US  GUIDE VASC ACCESS LEFT  07/16/2020   IR US  GUIDE VASC ACCESS LEFT  06/20/2021   IR US  GUIDE VASC ACCESS LEFT  06/25/2021   LUMBAR LAMINECTOMY/DECOMPRESSION MICRODISCECTOMY  03/16/2011   Procedure: LUMBAR LAMINECTOMY/DECOMPRESSION MICRODISCECTOMY;  Surgeon: Arley SHAUNNA Helling;  Location: [facility];  Service: Neurosurgery;  Laterality: N/A;   TEE WITHOUT CARDIOVERSION  03/21/2011    Procedure: TRANSESOPHAGEAL ECHOCARDIOGRAM (TEE);  Surgeon: Erick JONELLE Bergamo;  Location: [facility] ENDOSCOPY;  Service: Cardiovascular;  Laterality: N/A;  TEE for vegetations   TONSILLECTOMY     TRANSURETHRAL RESECTION OF BLADDER TUMOR N/A 06/18/2021   Procedure: TRANSURETHRAL RESECTION OF BLADDER TUMOR (TURBT);  Surgeon: Francisca Redell BROCKS, MD;  Location: [facility] ORS;  Service: Urology;  Laterality: N/A;   TUBAL LIGATION       Social History:   reports that she has been smoking cigarettes. She has a 165 pack-year smoking history. She has never been exposed to tobacco smoke. She has never used smokeless tobacco. She reports current alcohol  use of about 7.0 standard drinks of alcohol  per week. She reports that she does not use drugs.   Family History:  Her family history includes Heart disease in her mother; Leukemia in her sister. There is no history of Breast cancer, Colon cancer, Ovarian cancer, Uterine cancer, or Cervical cancer.   Allergies Allergies  Allergen Reactions   Adhesive [Tape] Other (See Comments)    Blisters, Band-Aids brand rips off the skin- paper tape only!!   Cefuroxime  Axetil Anxiety and Other (See Comments)    Made the patient jittery   Cefuroxime  Axetil Anxiety and Other (See Comments)    Shaky, amoxicillin ok Made the patient jittery   Pedi-Pre Tape Spray [Wound Dressing Adhesive] Other (See Comments)    Blisters, band-aide brand  Blisters, Band-Aids brand rips off the skin- paper tape only!!   Shellfish Allergy Anaphylaxis   Sulfa Antibiotics Anaphylaxis   Sulfa Drugs Cross Reactors Anaphylaxis   Other Hives and Other (See Comments)    Wool: Reaction is hives Advertising account executive tape: Reaction is blistering    Amoxicillin-Pot Clavulanate      Home Medications  Prior to Admission medications   Medication Sig Start Date End Date Taking? Authorizing Provider  ascorbic acid  (VITAMIN C) 500 MG tablet Take 500 mg by mouth daily.   Yes [provider]   cholecalciferol  (VITAMIN D) 400 UNITS TABS Take 400 Units by mouth daily.   Yes [provider]  cyanocobalamin  1000 MCG tablet Take 1,000 mcg by mouth daily.   Yes [provider]  diphenhydrAMINE  (BENADRYL ) 25 MG tablet Take 25 mg by mouth daily.   Yes [provider]  FLUoxetine  (PROZAC ) 20 MG capsule Take 20 mg by mouth daily.   Yes Lenon Layman ORN, MD  folic acid  (FOLVITE ) 1 MG tablet Take 1 mg by mouth in the morning and at bedtime.   Yes [provider]  gabapentin  (NEURONTIN ) 600 MG tablet Take 1 tablet (600 mg total) by mouth 3 (three) times daily. Patient taking differently: Take 300 mg by mouth at bedtime. 06/26/21  Yes Vann, Jessica U, DO  latanoprost  (XALATAN ) 0.005 % ophthalmic solution Place 1 drop into both eyes. 05/31/21  Yes [provider]  Multiple Vitamin (MULTIVITAMIN) tablet Take 1 tablet by mouth daily.    Yes [provider]  polyvinyl alcohol  (LIQUIFILM TEARS) 1.4 % ophthalmic solution Place 1 drop into both eyes as needed for dry eyes.   Yes [provider]  QUEtiapine  (SEROQUEL ) 25 MG tablet Take 25 mg by mouth at bedtime.   Yes [provider]  rosuvastatin  (CRESTOR ) 20 MG tablet Take 20 mg by mouth daily. 05/22/20  Yes [provider]  tiotropium (SPIRIVA ) 18 MCG inhalation capsule Place 18 mcg into inhaler and inhale daily.   Yes [provider]  traMADol  (ULTRAM ) 50 MG tablet Take 1 tablet (50 mg total) by mouth every 6 (six) hours as needed for moderate pain. 06/26/21  Yes Vann, Jessica U, DO  traZODone (DESYREL) 50 MG tablet Take 50-100 mg by mouth at bedtime as needed for sleep.   Yes [provider]     Critical care time: 30 min.   Sammi Gore, PA - [facility] For pager details, please see AMION or use Epic chat  After 1900, please call [facility] for cross coverage needs 02/06/2024, 9:54 AM

## 2024-02-06 NOTE — Progress Notes (Signed)
 PCCM Brief Note  Discussed with EDP briefly. ICU beds here very tight with some expected surgeries to come also. Pt accepted to [facility] but currently on wait list. Have asked EDP to inquire with [facility] regarding possibility of ED to ED transfer to expedite admission. If unable, EDP to notify us  back and will work with [facility] to figure out bed options.   Sammi Gore, PA - [facility] [place] [facility] For pager details, please see AMION or use Epic chat  After 1900, please call [facility] for cross coverage needs 02/06/2024, 7:36 AM

## 2024-02-06 NOTE — TOC CM/SW Note (Signed)
 Transition of Care [facility]) - Inpatient Brief Assessment   Patient Details  Name: [name] MRN: [id number] Date of Birth: 09-22-1940  Transition of [facility]) CM/SW Contact:    Lauraine FORBES Saa, LCSWA Phone Number: 02/06/2024, 1:59 PM   Clinical Narrative:  1:59 PM Per chart review, patient resides at home with spouse and child(ren). Patient has a PCP and insurance. Patient has SNF history with [facility]. Patient has HH history with [facility]. Patient has DME ([employer], [facility]) history. Patient's preferred pharmacy's are [facility] and [facility]. No TOC needs identified at this time. TOC will continue to follow.  Transition of Care Asessment: Insurance and Status: Insurance coverage has been reviewed Patient has primary care physician: Yes Home environment has been reviewed: Private Residence Prior level of function:: N/A Prior/Current Home Services: No current home services Social Drivers of Health Review: SDOH reviewed no interventions necessary Readmission risk has been reviewed: Yes (Currently Yellow 18%) Transition of care needs: no transition of care needs at this time

## 2024-02-06 NOTE — ED Notes (Signed)
 ED Provider at bedside.

## 2024-02-06 NOTE — ED Provider Notes (Signed)
 [place] [facility] AT [facility] Provider Note   CSN: [id number] Arrival date & time: 02/06/24  9595     Patient presents with: No chief complaint on file.   [name] is a 83 y.o. female.   The history is provided by the patient and medical records.   83 y.o. F with history of TTP, chronic kidney disease, COPD, hypertension, presenting to the ED for fever.  Per family has been ongoing since yesterday.  Also has been weak and limited movement.  Patient reports she always coughs due to her COPD, this does not seem any different than baseline.  Sats were 88% on room air with EMS, she is not generally oxygen dependent.  BP has been soft in the 90s systolic.  Patient reports eating/drinking as normal without vomiting/diarrhea.  Denies known covid exposures but some around her have had snotty noses.    Prior to Admission medications   Medication Sig Start Date End Date Taking? Authorizing Provider  ascorbic acid  (VITAMIN C) 500 MG tablet Take 500 mg by mouth daily.    [provider]  cholecalciferol  (VITAMIN D) 400 UNITS TABS Take 400 Units by mouth daily.    [provider]  cyanocobalamin  1000 MCG tablet Take 1,000 mcg by mouth daily.    [provider]  FLUoxetine  (PROZAC ) 20 MG capsule Take 20 mg by mouth daily.    Lenon Layman ORN, MD  folic acid  (FOLVITE ) 1 MG tablet Take 1 mg by mouth in the morning and at bedtime.    [provider]  gabapentin  (NEURONTIN ) 600 MG tablet Take 1 tablet (600 mg total) by mouth 3 (three) times daily. Patient taking differently: Take 600 mg by mouth 2 (two) times daily as needed. 06/26/21   Vann, Jessica U, DO  guaiFENesin (MUCINEX) 600 MG 12 hr tablet Take 600 mg by mouth daily.    [provider]  latanoprost  (XALATAN ) 0.005 % ophthalmic solution Place 1 drop into both eyes. 05/31/21   [provider]  Multiple Vitamin (MULTIVITAMIN) tablet Take 1 tablet by mouth  daily.     [provider]  polyvinyl alcohol  (LIQUIFILM TEARS) 1.4 % ophthalmic solution Place 1 drop into both eyes as needed for dry eyes.    [provider]  predniSONE  (DELTASONE ) 20 MG tablet Take 1.5 tablets (30 mg total) by mouth daily with breakfast. Or as directed by MD 08/25/21   Debby Olam POUR, NP  QUEtiapine  (SEROQUEL ) 25 MG tablet Take 25 mg by mouth at bedtime.    [provider]  rosuvastatin  (CRESTOR ) 20 MG tablet Take 10 mg by mouth at bedtime. 05/22/20   [provider]  tiotropium (SPIRIVA ) 18 MCG inhalation capsule Place 18 mcg into inhaler and inhale daily.    [provider]  traMADol  (ULTRAM ) 50 MG tablet Take 1 tablet (50 mg total) by mouth every 6 (six) hours as needed for moderate pain. 06/26/21   Vann, Jessica U, DO    Allergies: Adhesive [tape], Cefuroxime  axetil, Cefuroxime  axetil, Pedi-pre tape spray [wound dressing adhesive], Shellfish allergy, Other, Sulfa antibiotics, Amoxicillin-pot clavulanate, and Sulfa drugs cross reactors    Review of Systems  Constitutional:  Positive for fever.  All other systems reviewed and are negative.   Updated Vital Signs BP (!) 92/49 (BP Location: Right Arm)   Pulse 86   Temp (!) 101.8 F (38.8 C) (Oral)   Resp 14   Ht 5' 4 (1.626 m)   Wt 79.8  kg Comment: per pt  SpO2 92%   BMI 30.21 kg/m   Physical Exam Vitals and nursing note reviewed.  Constitutional:      Appearance: She is well-developed.     Comments: Pale, seems weak  HENT:     Head: Normocephalic and atraumatic.  Eyes:     Conjunctiva/sclera: Conjunctivae normal.     Pupils: Pupils are equal, round, and reactive to light.  Cardiovascular:     Rate and Rhythm: Normal rate and regular rhythm.     Heart sounds: Normal heart sounds.  Pulmonary:     Effort: Pulmonary effort is normal. No respiratory distress.     Breath sounds: Normal breath sounds. No rhonchi.     Comments: Sats 88-89% on RA, placed 3L with  improvement into 80's, NAD Abdominal:     General: Bowel sounds are normal.     Palpations: Abdomen is soft.  Musculoskeletal:        General: Normal range of motion.     Cervical back: Normal range of motion.  Skin:    General: Skin is warm and dry.  Neurological:     Mental Status: She is alert and oriented to person, place, and time.     (all labs ordered are listed, but only abnormal results are displayed) Labs Reviewed  CBC WITH DIFFERENTIAL/PLATELET - Abnormal; Notable for the following components:      Result Value   RBC 5.14 (*)    Hemoglobin 15.9 (*)    HCT 48.0 (*)    Platelets 101 (*)    All other components within normal limits  COMPREHENSIVE METABOLIC PANEL WITH GFR - Abnormal; Notable for the following components:   Glucose, Bld 114 (*)    Creatinine, Ser 1.52 (*)    Calcium  8.7 (*)    Total Protein 5.6 (*)    GFR, Estimated 34 (*)    All other components within normal limits  URINALYSIS, W/ REFLEX TO CULTURE (INFECTION SUSPECTED) - Abnormal; Notable for the following components:   Leukocytes,Ua SMALL (*)    All other components within normal limits  RESP PANEL BY RT-PCR (RSV, FLU A&B, COVID)  RVPGX2  CULTURE, BLOOD (ROUTINE X 2)  CULTURE, BLOOD (ROUTINE X 2)  I-STAT CG4 LACTIC ACID, ED    EKG: None  Radiology: DG Chest Port 1 View Result Date: 02/06/2024 EXAM: AP portable semi-upright view of the chest. 02/06/2024 04:51 AM COMPARISON: Portable chest radiograph 09/11/2023 and earlier. CLINICAL HISTORY: 83 year old female with fever, sepsis, decreased mobility, history of TTP, and COPD. FINDINGS: LINES, TUBES AND DEVICES: Stable chronic bilateral chest ports, catheters. LUNGS AND PLEURA: Lung volumes are stable. No focal pulmonary opacity. No pulmonary edema. No pleural effusion. No pneumothorax. HEART AND MEDIASTINUM: Mediastinal contours are stable. No acute abnormality of the cardiac silhouette. BONES AND SOFT TISSUES: No acute osseous abnormality.  IMPRESSION: 1. No acute cardiopulmonary abnormality. Electronically signed by: Helayne Hurst MD 02/06/2024 05:37 AM EDT RP Workstation: [id number]     Procedures   CRITICAL CARE Performed by: Olam CHRISTELLA Slocumb   Total critical care time: 65 minutes  Critical care time was exclusive of separately billable procedures and treating other patients.  Critical care was necessary to treat or prevent imminent or life-threatening deterioration.  Critical care was time spent personally by me on the following activities: development of treatment plan with patient and/or surrogate as well as nursing, discussions with consultants, evaluation of patient's response to treatment, examination of patient, obtaining history from patient or  surrogate, ordering and performing treatments and interventions, ordering and review of laboratory studies, ordering and review of radiographic studies, pulse oximetry and re-evaluation of patient's condition.   Medications Ordered in the ED  vancomycin  (VANCOREADY) IVPB 1500 mg/300 mL (1,500 mg Intravenous New Bag/Given 02/06/24 0637)  norepinephrine (LEVOPHED) 4mg  in (0.016 mg/mL) premix infusion (6 mcg/min Intravenous Rate/Dose Change 02/06/24 0639)  sodium chloride  0.9 % bolus 1,000 mL (0 mLs Intravenous Stopped 02/06/24 0515)  acetaminophen  (TYLENOL ) tablet 650 mg (650 mg Oral Given 02/06/24 0513)  sodium chloride  0.9 % bolus 1,000 mL (0 mLs Intravenous Stopped 02/06/24 0631)  ceFEPIme  (MAXIPIME ) 2 g in sodium chloride  0.9 % 100 mL IVPB (0 g Intravenous Stopped 02/06/24 0637)  sodium chloride  0.9 % bolus 1,000 mL (1,000 mLs Intravenous New Bag/Given 02/06/24 0558)                                    Medical Decision Making Amount and/or Complexity of Data Reviewed Labs: ordered. Radiology: ordered and independent interpretation performed. ECG/medicine tests: ordered and independent interpretation performed.  Risk OTC drugs. Prescription drug management. Decision  regarding hospitalization.   83 year old female presenting to the ED for sepsis.  Found to be afebrile, hypoxic, and hypotensive with EMS.  Does have history of TTP.  No recent bleeding.  She denies any known sick contacts, COVID or flu exposures.  She is febrile here, pale, appears weak.  She is satting around 88% on room air, placed on 3 L.  5:46 AM Spoke with daughter at bedside.  Similar presentation in May 2025 for sepsis, found to be related to her port (which was not removed, just IV abx).  Port was used yesterday for blood draw, sounds like had a near syncopal episode yesterday while at bank with husband shortly after drs appt and did not get up for dinner yesterday evening as normal.  Did complain of some sinus pressure/congestion. Labs thus far are reassuring with normal lactate.  Renal function appears at baseline.  Platelets 101.  CXR is clear.  Patient is not actively receiving plasmapheresis treatments, however she is under close observation.  All of her care is at [facility] and daughter prefers her transferred there.  States she was actually sent there from [facility] previously as they were not able to manage her care here.  I have initiated transfer with PAL line.  Patient's BP remain soft around 90 systolic, daugther states baseline is anywhere from 90-110 systolic.  Cultures sent.  6:10 AM 3rd liter started.   BP remains around 100 systolic.  If no significant improvement may need to start pressors.  Suspect source may be her port again.  6:19 AM BP is not improving after 2.5L which meets her 30cc/kg bolus.  BP still 80's/40's.  Discussed case with transfer team, they are reaching out to heme/onc service for admission.  I feel she ultimately will require pressors.  I discussed this with daughter, she is agreeable to starting low dose levophed for support now.    6:42 AM Spoke with transfer coordinator at [facility].  Unfortunately, no MICU beds available there.  They have  placed her on transfer list.  Given her current state and requiring pressor support, will plan to admit her here temporarily until bed is ready.  Daughter updated, she is agreeable.  6:46 AM Spoke with PCCM, Dr. Maree-- day team will come evaluate.  Daughter was  updated, all questions answered.  6:52 AM End of shift.  Oncoming team informed of plan given her critical state.   Final diagnoses:  Sepsis, due to unspecified organism, unspecified whether acute organ dysfunction present [facility])    ED Discharge Orders     None          Jarold Olam HERO, PA-C 02/06/24 [id number]    Bari Charmaine FALCON, MD 02/07/24 [phone]

## 2024-02-06 NOTE — Progress Notes (Signed)
 eLink Physician-Brief Progress Note Patient Name: [name] DOB: [birth year] MRN: [id number]   Date of Service  02/06/2024  HPI/Events of Note  Septic shock, concern for bacteremia/port infection Chronic TTP BP cuff makes her arm itch. It has been removed and pt still complaining of itching. No rash noted, only redness from where pt was scratching  eICU Interventions  Hydroxyzine for limited doses, hydrocortisone cream locally     Intervention Category Minor Interventions: Routine modifications to care plan (e.g. PRN medications for pain, fever)  [place] 02/06/2024, 10:03 PM

## 2024-02-07 LAB — DIFFERENTIAL
Abs Immature Granulocytes: 0.05 K/uL (ref 0.00–0.07)
Basophils Absolute: 0 K/uL (ref 0.0–0.1)
Basophils Relative: 0 %
Eosinophils Absolute: 0.2 K/uL (ref 0.0–0.5)
Eosinophils Relative: 2 %
Immature Granulocytes: 1 %
Lymphocytes Relative: 28 %
Lymphs Abs: 2.4 K/uL (ref 0.7–4.0)
Monocytes Absolute: 0.8 K/uL (ref 0.1–1.0)
Monocytes Relative: 9 %
Neutro Abs: 5.2 K/uL (ref 1.7–7.7)
Neutrophils Relative %: 60 %

## 2024-02-07 LAB — CBC
HCT: 39.4 % (ref 36.0–46.0)
HCT: 41 % (ref 36.0–46.0)
Hemoglobin: 13.1 g/dL (ref 12.0–15.0)
Hemoglobin: 13.7 g/dL (ref 12.0–15.0)
MCH: 31.1 pg (ref 26.0–34.0)
MCH: 31.1 pg (ref 26.0–34.0)
MCHC: 33.2 g/dL (ref 30.0–36.0)
MCHC: 33.4 g/dL (ref 30.0–36.0)
MCV: 93.6 fL (ref 80.0–100.0)
MCV: 93.2 fL (ref 80.0–100.0)
Platelets: 78 K/uL — ABNORMAL LOW (ref 150–400)
Platelets: 75 K/uL — ABNORMAL LOW (ref 150–400)
RBC: 4.21 MIL/uL (ref 3.87–5.11)
RBC: 4.4 MIL/uL (ref 3.87–5.11)
RDW: 13.8 % (ref 11.5–15.5)
RDW: 13.6 % (ref 11.5–15.5)
WBC: 8.6 K/uL (ref 4.0–10.5)
WBC: 8.7 K/uL (ref 4.0–10.5)
nRBC: 0 % (ref 0.0–0.2)
nRBC: 0 % (ref 0.0–0.2)

## 2024-02-07 LAB — TECHNOLOGIST SMEAR REVIEW: Plt Morphology: NORMAL

## 2024-02-07 LAB — PHOSPHORUS: Phosphorus: 2.7 mg/dL (ref 2.5–4.6)

## 2024-02-07 LAB — MAGNESIUM: Magnesium: 1.6 mg/dL — ABNORMAL LOW (ref 1.7–2.4)

## 2024-02-07 LAB — BASIC METABOLIC PANEL WITH GFR
Anion gap: 9 (ref 5–15)
BUN: 14 mg/dL (ref 8–23)
CO2: 20 mmol/L — ABNORMAL LOW (ref 22–32)
Calcium: 8.1 mg/dL — ABNORMAL LOW (ref 8.9–10.3)
Chloride: 108 mmol/L (ref 98–111)
Creatinine, Ser: 1.06 mg/dL — ABNORMAL HIGH (ref 0.44–1.00)
GFR, Estimated: 53 mL/min — ABNORMAL LOW (ref >60)
Glucose, Bld: 162 mg/dL — ABNORMAL HIGH (ref 70–99)
Potassium: 4 mmol/L (ref 3.5–5.1)
Sodium: 137 mmol/L (ref 135–145)

## 2024-02-07 MED ORDER — TRAZODONE HCL 50 MG PO TABS: 100.0000 mg | ORAL_TABLET | Freq: Every evening | ORAL | Status: DC | PRN

## 2024-02-07 MED ORDER — MAGNESIUM SULFATE 4 GM/100ML IV SOLN
4.0000 g | Freq: Once | INTRAVENOUS | Status: AC
  Administered 2024-02-07: 4 g via INTRAVENOUS
  Filled 2024-02-07: qty 100

## 2024-02-07 MED ORDER — ENOXAPARIN SODIUM 40 MG/0.4ML IJ SOSY
40.0000 mg | PREFILLED_SYRINGE | INTRAMUSCULAR | Status: DC
  Administered 2024-02-07: 40 mg via SUBCUTANEOUS
  Filled 2024-02-07: qty 0.4

## 2024-02-07 NOTE — Plan of Care (Signed)
  Problem: Education: Goal: Knowledge of General Education information will improve Description: Including pain rating scale, medication(s)/side effects and non-pharmacologic comfort measures Outcome: Progressing   Problem: Health Behavior/Discharge Planning: Goal: Ability to manage health-related needs will improve Outcome: Progressing   Problem: Clinical Measurements: Goal: Ability to maintain clinical measurements within normal limits will improve Outcome: Progressing Goal: Diagnostic test results will improve Outcome: Progressing Goal: Respiratory complications will improve Outcome: Progressing Goal: Cardiovascular complication will be avoided Outcome: Progressing   Problem: Activity: Goal: Risk for activity intolerance will decrease Outcome: Progressing   Problem: Nutrition: Goal: Adequate nutrition will be maintained Outcome: Progressing   Problem: Coping: Goal: Level of anxiety will decrease Outcome: Progressing   Problem: Elimination: Goal: Will not experience complications related to bowel motility Outcome: Progressing Goal: Will not experience complications related to urinary retention Outcome: Progressing   Problem: Pain Managment: Goal: General experience of comfort will improve and/or be controlled Outcome: Progressing   Problem: Safety: Goal: Ability to remain free from injury will improve Outcome: Progressing   Problem: Skin Integrity: Goal: Risk for impaired skin integrity will decrease Outcome: Progressing   Problem: Clinical Measurements: Goal: Will remain free from infection Outcome: Not Progressing

## 2024-02-07 NOTE — Progress Notes (Incomplete)
 eLink Physician-Brief Progress Note Patient Name: [name] DOB: Feb 10, 1941 MRN: [id number]   Date of Service  02/07/2024  HPI/Events of Note  ADAM  eICU Interventions       Intervention Category Intermediate Interventions: OtherBETHA [name], P 02/07/2024, 11:54 PM

## 2024-02-07 NOTE — Progress Notes (Signed)
 NAME:  [name], MRN:  [id number], DOB:  [birth year], LOS: 1 ADMISSION DATE:  02/06/2024, CONSULTATION DATE:  02/06/24 REFERRING MD:  Bari CHIEF COMPLAINT:  Weakness, AMS   History of Present Illness:  [name] is a 83 y.o. 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  She was initially diagnosed in 2005 and has had multiple relapses in the past and has been treated with multiple agents.  Per her last note from [facility] hematology on 12/25/2023, she had been doing well off apheresis and all immunosuppressive therapy since April 2025 and had been in hematologic remission with chronic mild thrombocytopenia but no evidence of any microangiopathic changes to suggest active TTP.  Recommendations were to continue close follow-up but extend labs to once a month with port flushes and see her back after 3 round.  She presented to [facility] ED on 02/05/2024 with AMS, weakness, fever to 102.48F.  She had apparently been in her usual state of health and had actually seen [facility] for routine labs earlier that day.  Following her appointment while in the car on the way home, she felt like she was becoming feverish.  Later on at home she had generalized weakness.  She went to bed and apparently the middle of the night try to get up to use the restroom but was unable to do so due to weakness.  She also appeared somewhat confused at the time.  Family subsequently called EMS who brought her to [facility] ED.  On arrival to ED, she was noted to be hypotensive with SBP in the 80s and hypoxic requiring 3 L nasal cannula to maintain sats in the 90s.  She received 3 L of normal saline and had minimal improvement in her blood pressure; therefore, she was started on a norepinephrine infusion.  EDP discussed the case with [facility] transfer center and patient was accepted and transferred there; however, there were no beds available at their facility.  PCCM at [facility] subsequently called for  ICU admission.  We inquired about ED to ED transfer given limited bed availability at [facility]; however, this was not possible.  She is therefore being admitted to our facility.  Of note, she had a very similar episode to this in May 2025 felt to be due to sepsis from her indwelling ports (1 port on each side of her chest).  She was discharged on vancomycin  for several days per ID recommendations.  It was not felt that her port needed to be removed.  Pertinent  Medical History:  has Epidural abscess; Sepsis (HCC); History of TTP (thrombotic thrombocytopenic purpura); COPD exacerbation (HCC); Enterococcal sepsis (HCC); Endocarditis, bacterial, acute/subacute; Gait instability; Tobacco use disorder; TTP (thrombotic thrombocytopenic purpura); Oral thrush; Hypokalemia; History of plasmapheresis; Bronchitis, chronic obstructive w acute bronchitis (HCC); Fever; Pulmonary edema; Abnormal laboratory test; Chronic pain disorder; Mood disorder; Acute blood loss anemia; Adnexal cyst; Aortic atherosclerosis; Acute pyelonephritis; Nicotine  dependence; Nephrolithiasis; Acute left-sided weakness; AKI (acute kidney injury); T.T.P. syndrome (HCC); Anaphylactic reaction , suspect shellfish (shrimp), initial encounter; Anaphylactic shock; Chronic kidney disease, stage 3a (HCC); Chronic back pain; Essential hypertension; Acute respiratory failure with hypoxia (HCC); Elevated troponin; Anaphylactic reaction; and Shock (HCC) on their problem list.  Significant Hospital Events: Including procedures, antibiotic start and stop dates in addition to other pertinent events   10/7 admit to ICU. Accepted to [facility] for transfer, awaiting bed  Interim History / Subjective:   Doing well. Wants  to go home. Has been having URI symptoms for about a week PTA. No chest pain, shortness or breath, cough, or generalized swelling. No lower urinary symptoms. Feels well this AM. Able to tell me about medical history, medications. Lives with husband,  daughter, and son-in law in [place]  Objective:  Blood pressure (!) 145/51, pulse 78, temperature 99.2 F (37.3 C), temperature source Oral, resp. rate 16, height 5' 4 (1.626 m), weight 78 kg, SpO2 92%.        Intake/Output Summary (Last 24 hours) at 02/07/2024 0721 Last data filed at 02/07/2024 0700 Gross per 24 hour  Intake 4863.99 ml  Output 850 ml  Net 4013.99 ml   Filed Weights   02/06/24 0412 02/06/24 1315 02/07/24 0500  Weight: 79.8 kg 76.1 kg 78 kg   Physical Exam: General: Female, sitting in bed eating breakfast in NAD MMM Cardiovascular: RRR no murmurs,  bilateral subcutanous ports without overlying erythema or drainage. No tenderness Lungs: CTAB, no wheezing Abdomen: Obese. BS x 4, soft, NT/ND.  Musculoskeletal: No lower extremity edema  Assessment & Plan:    Neuro  Acute encephalopathy, resolved  Mood disorder Anxiety and depression -  Continue Quetiapine  - Continue Fluoxetine  - Gabapentin  300 mg at bedtime  Insomnia - Restart Trazodone 50 mg nightly  Respiratory  Acute hypoxic respiratory failure,  - Increased URI symptoms. NO cough this AM - Continue supplemental O2 as needed to maintain SpO2 > 92%. - MRSA negative - DC vancomycin  today - Continue Zosyn  for 48 HR; DC if neg Bcx thereafter - Yulperi instead on home tioroprium  COPD not in exacerbation Tobacco use disorder - PTA tioproprium bromide - SPO2 goal 89-93  Cardiac  Shock Unclear etiology; initially suspected to be due to hypovolemia > sepsis - s/p 2x 1L fluid boluses - Transient vasoppresor need, resolved overnight  - Bcx NGTD - MRSA negative - DC vancomycin  today - Continue Zosyn  for 48 HR; DC if neg Bcx thereafter  GI HH diet No PPI BM 2 days ago; Miralax  PRN. Will trial today  ID  Fever on admission No leukocytosis. No fevers. Has ports without over Bcx NGTD - MRSA negative - DC vancomycin  today - Continue Zosyn  for 48 HR; DC if neg Bcx thereafter  Lines:  implanted port   Endo   Bc at goal. NO PTA DM diagnosis  Renal  AKI on CKD3b?, resolved Admission Cr 1.52, CrCl 29 Seems like baseline Cr ~1.2 Now s/p 2x 1L boluses and maintenance - AM labs at baseline  Heme  Chronic thrombotic thrombocytopenia Thrombocytopenia Normal LDH,  Bili. PLT 101; likely dilutional - ADAMTS13  pending - technologist smear pending - Does not appear to be in flare - Patient is currently stable; do not think she should be transferred to [facility]  Anticoagulation: Lovenox  Reason: DVT   MSK/Other  Dispo: Transfer to floor in the afternoon  Labs   CBC: Recent Labs  Lab 02/06/24 0425 02/06/24 1345  WBC 7.4 7.3  NEUTROABS 4.7 4.1  HGB 15.9* 14.1  HCT 48.0* 43.7  MCV 93.4 95.0  PLT 101* 91*    Basic Metabolic Panel: Recent Labs  Lab 02/06/24 0425  NA 137  K 4.2  CL 106  CO2 24  GLUCOSE 114*  BUN 16  CREATININE 1.52*  CALCIUM  8.7*   GFR: Estimated Creatinine Clearance: 29.3 mL/min (A) (by C-G formula based on SCr of 1.52 mg/dL (H)). Recent Labs  Lab 02/06/24 0425 02/06/24 0434 02/06/24 1345  PROCALCITON 0.34  --   --  WBC 7.4  --  7.3  LATICACIDVEN  --  1.0  --     Liver Function Tests: Recent Labs  Lab 02/06/24 0425  AST 21  ALT 14  ALKPHOS 38  BILITOT 1.1  PROT 5.6*  ALBUMIN 3.5   No results for input(s): LIPASE, AMYLASE in the last 168 hours. No results for input(s): AMMONIA in the last 168 hours.  ABG    Component Value Date/Time   PHART 7.38 10/08/2021 2339   PCO2ART 61 (H) 10/08/2021 2339   PO2ART 31 (LL) 10/08/2021 2339   HCO3 36.1 (H) 10/08/2021 2339   TCO2 22 08/29/2013 1423   ACIDBASEDEF 3.0 (H) 08/19/2013 1546   O2SAT 44.8 10/08/2021 2339     Coagulation Profile: No results for input(s): INR, PROTIME in the last 168 hours.  Cardiac Enzymes: No results for input(s): CKTOTAL, CKMB, CKMBINDEX, TROPONINI in the last 168 hours.  HbA1C: Hemoglobin A1C  Date/Time Value Ref Range  Status  09/14/2012 05:44 AM 5.4 4.2 - 6.3 % Final    Comment:    The American Diabetes Association recommends that a primary goal of therapy should be <7% and that physicians should reevaluate the treatment regimen in patients with HbA1c values consistently >8%.    Hgb A1c MFr Bld  Date/Time Value Ref Range Status  06/20/2021 06:08 AM 5.3 4.8 - 5.6 % Final    Comment:    (NOTE) Pre diabetes:          5.7%-6.4%  Diabetes:              >6.4%  Glycemic control for   <7.0% adults with diabetes   08/20/2013 01:35 PM 5.8 (H) <5.7 % Final    Comment:    (NOTE)                                                                       According to the ADA Clinical Practice Recommendations for 2011, when HbA1c is used as a screening test:  >=6.5%   Diagnostic of Diabetes Mellitus           (if abnormal result is confirmed) 5.7-6.4%   Increased risk of developing Diabetes Mellitus References:Diagnosis and Classification of Diabetes Mellitus,Diabetes Care,2011,34(Suppl 1):S62-S69 and Standards of Medical Care in         Diabetes - 2011,Diabetes Care,2011,34 (Suppl 1):S11-S61.    CBG: Recent Labs  Lab 02/06/24 1516  GLUCAP 123*    Review of Systems:   All negative; except for those that are bolded, which indicate positives.  Constitutional: weight loss, weight gain, night sweats, fevers, chills, fatigue, weakness.  HEENT: headaches, sore throat, sneezing, nasal congestion, post nasal drip, difficulty swallowing, tooth/dental problems, visual complaints, visual changes, ear aches. Neuro: difficulty with speech, weakness, numbness, ataxia, confusion. CV:  chest pain, orthopnea, PND, swelling in lower extremities, dizziness, palpitations, syncope.  Resp: cough, hemoptysis, dyspnea, wheezing. GI: heartburn, indigestion, abdominal pain, nausea, vomiting, diarrhea, constipation, change in bowel habits, loss of appetite, hematemesis, melena, hematochezia.  GU: dysuria, change in color of  urine, urgency or frequency, flank pain, hematuria. MSK: joint pain or swelling, decreased range of motion. Psych: change in mood or affect, depression, anxiety, suicidal ideations, homicidal ideations. Skin: rash, itching, bruising.   Past  Medical History:  She,  has a past medical history of Abnormal laboratory test (09/10/2013), Acute delirium, Adnexal cyst (09/25/2020), Anemia, Arthritis, Bacteremia (03/2011), Blood dyscrasia, Blood transfusion, Bronchitis, chronic obstructive w acute bronchitis (HCC) (08/12/2013), Chronic back pain, COPD (chronic obstructive pulmonary disease) (HCC), Depression, Endocarditis, Epidural abscess, Heart murmur, History of plasmapheresis (08/08/2013), History of TTP (thrombotic thrombocytopenic purpura), Hypokalemia (08/08/2013), Normal echocardiogram (03/15/2011), Oral thrush (08/08/2013), Septicemia (03/2011), Spinal stenosis, lumbar, and Tobacco abuse (05/10/2013).   Surgical History:   Past Surgical History:  Procedure Laterality Date   BACK SURGERY     BLADDER INSTILLATION N/A 06/18/2021   Procedure: BLADDER INSTILLATION OF GEMCITABINE ;  Surgeon: Francisca Redell BROCKS, MD;  Location: [facility] ORS;  Service: Urology;  Laterality: N/A;   CATARACT EXTRACTION W/ INTRAOCULAR LENS  IMPLANT, BILATERAL  ~ 2010   CESAREAN SECTION  10/09/1976   CYSTOSCOPY/URETEROSCOPY/HOLMIUM LASER/STENT PLACEMENT Right 06/18/2021   Procedure: CYSTOSCOPY/URETEROSCOPY/HOLMIUM LASER/STENT PLACEMENT;  Surgeon: Francisca Redell BROCKS, MD;  Location: [facility] ORS;  Service: Urology;  Laterality: Right;   ESOPHAGOGASTRODUODENOSCOPY (EGD) WITH PROPOFOL  N/A 09/27/2020   Procedure: ESOPHAGOGASTRODUODENOSCOPY (EGD) WITH PROPOFOL ;  Surgeon: Donnald Charleston, MD;  Location: [facility] ENDOSCOPY;  Service: Endoscopy;  Laterality: N/A;   EYE SURGERY     INSERTION OF DIALYSIS CATHETER Left 07/12/2013   Procedure: INSERTION OF DIALYSIS CATHETER   with Ultrasound;  Surgeon: Krystal JULIANNA Doing, MD;  Location: [facility] OR;  Service:  Vascular;  Laterality: Left;   IR FLUORO GUIDE CV LINE LEFT  06/20/2021   IR FLUORO GUIDE CV LINE LEFT  06/25/2021   IR PERC TUN PERIT CATH WO PORT S&I /IMAG  07/16/2020   IR RADIOLOGIST EVAL & MGMT  09/26/2020   IR US  GUIDE VASC ACCESS LEFT  07/16/2020   IR US  GUIDE VASC ACCESS LEFT  06/20/2021   IR US  GUIDE VASC ACCESS LEFT  06/25/2021   LUMBAR LAMINECTOMY/DECOMPRESSION MICRODISCECTOMY  03/16/2011   Procedure: LUMBAR LAMINECTOMY/DECOMPRESSION MICRODISCECTOMY;  Surgeon: Arley SHAUNNA Helling;  Location: [facility];  Service: Neurosurgery;  Laterality: N/A;   TEE WITHOUT CARDIOVERSION  03/21/2011   Procedure: TRANSESOPHAGEAL ECHOCARDIOGRAM (TEE);  Surgeon: Erick JONELLE Bergamo;  Location: [facility] ENDOSCOPY;  Service: Cardiovascular;  Laterality: N/A;  TEE for vegetations   TONSILLECTOMY     TRANSURETHRAL RESECTION OF BLADDER TUMOR N/A 06/18/2021   Procedure: TRANSURETHRAL RESECTION OF BLADDER TUMOR (TURBT);  Surgeon: Francisca Redell BROCKS, MD;  Location: [facility] ORS;  Service: Urology;  Laterality: N/A;   TUBAL LIGATION       Social History:   reports that she has been smoking cigarettes. She has a 165 pack-year smoking history. She has never been exposed to tobacco smoke. She has never used smokeless tobacco. She reports current alcohol  use of about 7.0 standard drinks of alcohol  per week. She reports that she does not use drugs.   Family History:  Her family history includes Heart disease in her mother; Leukemia in her sister. There is no history of Breast cancer, Colon cancer, Ovarian cancer, Uterine cancer, or Cervical cancer.   Allergies Allergies  Allergen Reactions   Adhesive [Tape] Other (See Comments)    Blisters, Band-Aids brand rips off the skin- paper tape only!!   Cefuroxime  Axetil Anxiety and Other (See Comments)    Made the patient jittery   Cefuroxime  Axetil Anxiety and Other (See Comments)    Shaky, amoxicillin ok Made the patient jittery   Pedi-Pre Tape Spray [Wound Dressing Adhesive] Other  (See Comments)    Blisters, band-aide brand  Blisters, Band-Aids brand  rips off the skin- paper tape only!!   Shellfish Allergy Anaphylaxis   Sulfa Antibiotics Anaphylaxis   Sulfa Drugs Cross Reactors Anaphylaxis   Other Hives and Other (See Comments)    Wool: Reaction is hives Advertising account executive tape: Reaction is blistering    Amoxicillin-Pot Clavulanate      Home Medications  Prior to Admission medications   Medication Sig Start Date End Date Taking? Authorizing Provider  ascorbic acid  (VITAMIN C) 500 MG tablet Take 500 mg by mouth daily.   Yes [provider]  cholecalciferol  (VITAMIN D) 400 UNITS TABS Take 400 Units by mouth daily.   Yes [provider]  cyanocobalamin  1000 MCG tablet Take 1,000 mcg by mouth daily.   Yes [provider]  diphenhydrAMINE  (BENADRYL ) 25 MG tablet Take 25 mg by mouth daily.   Yes [provider]  FLUoxetine  (PROZAC ) 20 MG capsule Take 20 mg by mouth daily.   Yes Lenon Layman ORN, MD  folic acid  (FOLVITE ) 1 MG tablet Take 1 mg by mouth in the morning and at bedtime.   Yes [provider]  gabapentin  (NEURONTIN ) 600 MG tablet Take 1 tablet (600 mg total) by mouth 3 (three) times daily. Patient taking differently: Take 300 mg by mouth at bedtime. 06/26/21  Yes Vann, Jessica U, DO  latanoprost  (XALATAN ) 0.005 % ophthalmic solution Place 1 drop into both eyes. 05/31/21  Yes [provider]  Multiple Vitamin (MULTIVITAMIN) tablet Take 1 tablet by mouth daily.    Yes [provider]  polyvinyl alcohol  (LIQUIFILM TEARS) 1.4 % ophthalmic solution Place 1 drop into both eyes as needed for dry eyes.   Yes [provider]  QUEtiapine  (SEROQUEL ) 25 MG tablet Take 25 mg by mouth at bedtime.   Yes [provider]  rosuvastatin  (CRESTOR ) 20 MG tablet Take 20 mg by mouth daily. 05/22/20  Yes [provider]  tiotropium (SPIRIVA ) 18 MCG inhalation capsule Place 18 mcg into  inhaler and inhale daily.   Yes [provider]  traMADol  (ULTRAM ) 50 MG tablet Take 1 tablet (50 mg total) by mouth every 6 (six) hours as needed for moderate pain. 06/26/21  Yes Vann, Jessica U, DO  traZODone (DESYREL) 50 MG tablet Take 50-100 mg by mouth at bedtime as needed for sleep.   Yes [provider]     Critical care time: 30 min.   Hadassah Kristy Ahr, MD - PGY-3  02/07/2024, 7:21 AM

## 2024-02-07 NOTE — Progress Notes (Signed)
 Bernarda from [facility] called regarding transfer of patient to their facility. She stated it will be at least another day before transfer due to low capacity.

## 2024-02-08 DIAGNOSIS — J449 Chronic obstructive pulmonary disease, unspecified: Secondary | ICD-10-CM

## 2024-02-08 DIAGNOSIS — N179 Acute kidney failure, unspecified: Secondary | ICD-10-CM

## 2024-02-08 DIAGNOSIS — M3119 Other thrombotic microangiopathy: Secondary | ICD-10-CM

## 2024-02-08 LAB — BASIC METABOLIC PANEL WITH GFR
Anion gap: 10 (ref 5–15)
BUN: 10 mg/dL (ref 8–23)
CO2: 29 mmol/L (ref 22–32)
Calcium: 8.2 mg/dL — ABNORMAL LOW (ref 8.9–10.3)
Chloride: 103 mmol/L (ref 98–111)
Creatinine, Ser: 1.23 mg/dL — ABNORMAL HIGH (ref 0.44–1.00)
GFR, Estimated: 44 mL/min — ABNORMAL LOW (ref >60)
Glucose, Bld: 146 mg/dL — ABNORMAL HIGH (ref 70–99)
Potassium: 4.5 mmol/L (ref 3.5–5.1)
Sodium: 142 mmol/L (ref 135–145)

## 2024-02-08 LAB — CBC
HCT: 41.4 % (ref 36.0–46.0)
Hemoglobin: 13.8 g/dL (ref 12.0–15.0)
MCH: 31.3 pg (ref 26.0–34.0)
MCHC: 33.3 g/dL (ref 30.0–36.0)
MCV: 93.9 fL (ref 80.0–100.0)
Platelets: 75 K/uL — ABNORMAL LOW (ref 150–400)
RBC: 4.41 MIL/uL (ref 3.87–5.11)
RDW: 13.4 % (ref 11.5–15.5)
WBC: 7.6 K/uL (ref 4.0–10.5)
nRBC: 0 % (ref 0.0–0.2)

## 2024-02-08 LAB — MAGNESIUM: Magnesium: 2.2 mg/dL (ref 1.7–2.4)

## 2024-02-08 LAB — PHOSPHORUS: Phosphorus: 3.3 mg/dL (ref 2.5–4.6)

## 2024-02-08 MED ORDER — IPRATROPIUM-ALBUTEROL 0.5-2.5 (3) MG/3ML IN SOLN: 3.0000 mL | Freq: Four times a day (QID) | RESPIRATORY_TRACT | Status: DC | PRN

## 2024-02-08 MED ORDER — IPRATROPIUM-ALBUTEROL 0.5-2.5 (3) MG/3ML IN SOLN
3.0000 mL | Freq: Three times a day (TID) | RESPIRATORY_TRACT | Status: DC
  Administered 2024-02-08: 3 mL via RESPIRATORY_TRACT
  Filled 2024-02-08: qty 3

## 2024-02-08 MED ORDER — NICOTINE 21 MG/24HR TD PT24
21.0000 mg | MEDICATED_PATCH | Freq: Every day | TRANSDERMAL | Status: DC
  Administered 2024-02-08 – 2024-02-09 (×2): 21 mg via TRANSDERMAL
  Filled 2024-02-08 (×2): qty 1

## 2024-02-08 MED ORDER — BUDESONIDE 0.5 MG/2ML IN SUSP
0.5000 mg | Freq: Two times a day (BID) | RESPIRATORY_TRACT | Status: DC
  Administered 2024-02-08 – 2024-02-09 (×3): 0.5 mg via RESPIRATORY_TRACT
  Filled 2024-02-08 (×3): qty 2

## 2024-02-08 MED ORDER — BACITRACIN-NEOMYCIN-POLYMYXIN OINTMENT TUBE
TOPICAL_OINTMENT | Freq: Two times a day (BID) | CUTANEOUS | Status: DC
  Filled 2024-02-08: qty 14

## 2024-02-08 MED ORDER — CHOLECALCIFEROL 10 MCG (400 UNIT) PO TABS
400.0000 [IU] | ORAL_TABLET | Freq: Every day | ORAL | Status: DC
  Administered 2024-02-08 – 2024-02-09 (×2): 400 [IU] via ORAL
  Filled 2024-02-08 (×2): qty 1

## 2024-02-08 MED ORDER — VITAMIN B-12 1000 MCG PO TABS
1000.0000 ug | ORAL_TABLET | Freq: Every day | ORAL | Status: DC
  Administered 2024-02-08 – 2024-02-09 (×2): 1000 ug via ORAL
  Filled 2024-02-08 (×2): qty 1

## 2024-02-08 NOTE — Evaluation (Signed)
 Occupational Therapy Evaluation Patient Details Name: [name] MRN: [id number] DOB: Jun 17, 1940 Today's Date: 02/08/2024   History of Present Illness   Patient is an 83 y/o female admitted 02/06/24 with weakness and fever with hypotension and concern for sepsis. PMH positive for TTP (indwelling port B chest), COPD, tobacco use, CKD 3a, chronic back pain, HTN, CVA.     Clinical Impressions At baseline, pt is largely Ind to Mod I with ADLs with daughter providing Supervision to Contact guard assist for safety with showers. At baseline, pt performs functional mobility Mod I with a SPC or walker. Pt manages her own medication and receives assistance from family for other IADLs. Pt now presents near baseline PLOF but with decreased balance, impaired cardiopulmonary status, mildly decreased activity tolerance, mildly decreased L shoulder strength, and mildly decreased safety and independence with functional tasks. Pt currently demonstrating ability to largely complete ADLs Ind to Contact guard assist for safety and functional transfers with RW with close Supervision to Contact guard assist for safety. Pt's husband demonstrates ability to Independently assist pt with ADLs at her current functional level. Pt O2 sat >/92% on 3L continuous O2 through nasal cannula throughout session. Pt participated well in session, is motivated to return to PLOF, and has good family support. Pt will benefit from acute OT services to address deficits and increase safety with functional tasks. No post acute skilled OT needs are anticipated.      If plan is discharge home, recommend the following:   A little help with walking and/or transfers;A little help with bathing/dressing/bathroom;Assistance with cooking/housework;Assist for transportation;Help with stairs or ramp for entrance     Functional Status Assessment   Patient has had a recent decline in their functional status and demonstrates the ability to make  significant improvements in function in a reasonable and predictable amount of time.     Equipment Recommendations   None recommended by OT (Pt already has needed equipment)     Recommendations for Other Services         Precautions/Restrictions   Precautions Precautions: Fall Recall of Precautions/Restrictions: Intact Precaution/Restrictions Comments: reports fall prior to admission due to sepsis Restrictions Weight Bearing Restrictions Per Provider Order: No     Mobility Bed Mobility Overal bed mobility: Needs Assistance Bed Mobility: Supine to Sit, Sit to Supine     Supine to sit: Modified independent (Device/Increase time) Sit to supine: Supervision   General bed mobility comments: Supervision for safety; assist for lines    Transfers Overall transfer level: Needs assistance Equipment used: Rolling walker (2 wheels) Transfers: Sit to/from Stand, Bed to chair/wheelchair/BSC Sit to Stand: Supervision, Contact guard assist     Step pivot transfers: Contact guard assist     General transfer comment: Supervision to CGA for safety with assist for lines      Balance Overall balance assessment: Needs assistance Sitting-balance support: No upper extremity supported, Feet supported Sitting balance-Leahy Scale: Good     Standing balance support: Single extremity supported, Bilateral upper extremity supported, During functional activity, Reliant on assistive device for balance Standing balance-Leahy Scale: Poor Standing balance comment: UE support for balance                           ADL either performed or assessed with clinical judgement   ADL Overall ADL's : Needs assistance/impaired Eating/Feeding: Independent;Sitting   Grooming: Supervision/safety;Contact guard assist;Standing   Upper Body Bathing: Set up;Supervision/ safety;Sitting   Lower Body Bathing:  Supervison/ safety;Contact guard assist;Sit to/from stand   Upper Body Dressing :  Contact guard assist;Sitting Upper Body Dressing Details (indicate cue type and reason): assist to manage lines Lower Body Dressing: Supervision/safety;Contact guard assist;Sit to/from stand   Toilet Transfer: Supervision/safety;Contact guard assist;Ambulation;BSC/3in1;Rolling walker (2 wheels) Toilet Transfer Details (indicate cue type and reason): simulated at EOB Toileting- Clothing Manipulation and Hygiene: Supervision/safety;Sit to/from stand         General ADL Comments: Pt with mildly decreased activity tolerance and impaired cardiopulmonary status. Pt's husband demonstrates ability to assist pt Independently with ADLs at pt's current funcitonal level.     Vision Baseline Vision/History: 1 Wears glasses Ability to See in Adequate Light: 0 Adequate (with glasses donned) Patient Visual Report: No change from baseline Vision Assessment?: No apparent visual deficits Additional Comments: Vision [facility] for tasks assessed with glasses on     Perception         Praxis         Pertinent Vitals/Pain Pain Assessment Pain Assessment: No/denies pain Pain Intervention(s): Monitored during session     Extremity/Trunk Assessment Upper Extremity Assessment Upper Extremity Assessment: Right hand dominant;Overall [facility] for tasks assessed (gross L shoulder strength 4-/5; B UE strength otherwise 4+/5)   Lower Extremity Assessment Lower Extremity Assessment: Defer to PT evaluation   Cervical / Trunk Assessment Cervical / Trunk Assessment: Normal   Communication Communication Communication: Impaired Factors Affecting Communication: Hearing impaired   Cognition Arousal: Alert Behavior During Therapy: WFL for tasks assessed/performed Cognition: No apparent impairments             OT - Cognition Comments: Pt AAOx4 with cognition WFL for tasks assessed; cognition not formally screened or evaluated                 Following commands: Intact       Cueing  General  Comments   Cueing Techniques: Verbal cues  VSS on 3L continuous O2 through nasal cannula with O2 sat >/92% throughout session. Husband present and supportive during session.   Exercises Exercises: General Upper Extremity General Exercises - Upper Extremity Shoulder Flexion: AROM, Left, 10 reps, Seated, Strengthening Shoulder ABduction: AROM, Left, 10 reps, Seated, Strengthening   Shoulder Instructions      Home Living Family/patient expects to be discharged to:: Private residence Living Arrangements: Spouse/significant other;Children;Other relatives (husband, daughter, SIL, grandson (36 yo)) Available Help at Discharge: Family;Available 24 hours/day Type of Home: House       Home Layout: Two level Alternate Level Stairs-Number of Steps: 15 Alternate Level Stairs-Rails: Right;Left Bathroom Shower/Tub: Chief Strategy Officer: Standard     Home Equipment: Cane - single point;BSC/3in1;Rollator (4 wheels);Shower seat;Grab bars - tub/shower   Additional Comments: has another smaller walker upstairs; daughter and SIL are both EMTs      Prior Functioning/Environment Prior Level of Function : Independent/Modified Independent;Needs assist             Mobility Comments: Mod I with rollator or cane in home ADLs Comments: Largely Ind to Mod I with ADLs, daughter provides Supervision-CGA for shower once a week. Pt manages her own medications and family assist with other IADLs. Pt enjoys pulling weeds in the yard and doing crafts.    OT Problem List: Decreased activity tolerance;Impaired balance (sitting and/or standing)   OT Treatment/Interventions: Self-care/ADL training;Therapeutic exercise;Energy conservation;DME and/or AE instruction;Therapeutic activities;Patient/family education;Balance training      OT Goals(Current goals can be found in the care plan section)   Acute Rehab OT Goals  Patient Stated Goal: to return home OT Goal Formulation: With  patient/family Time For Goal Achievement: 02/22/24 Potential to Achieve Goals: Good ADL Goals Pt Will Perform Grooming: with modified independence;standing Pt Will Perform Lower Body Dressing: with modified independence;sit to/from stand Pt Will Transfer to Toilet: with modified independence;ambulating;regular height toilet (with least restrictive AD) Pt/caregiver will Perform Home Exercise Program: Increased strength;Independently;With theraband;With written HEP provided (increased L shoulder strength) Additional ADL Goal #1: Patient will demonstrate ability to Independently state 4 energy conservation strategies to increase safety and independence with functional tasks.   OT Frequency:  Min 1X/week    Co-evaluation              AM-PAC OT 6 Clicks Daily Activity     Outcome Measure Help from another person eating meals?: None Help from another person taking care of personal grooming?: A Little Help from another person toileting, which includes using toliet, bedpan, or urinal?: A Little Help from another person bathing (including washing, rinsing, drying)?: A Little Help from another person to put on and taking off regular upper body clothing?: A Little Help from another person to put on and taking off regular lower body clothing?: A Little 6 Click Score: 19   End of Session Equipment Utilized During Treatment: Rolling walker (2 wheels);Oxygen;Gait belt Nurse Communication: Mobility status  Activity Tolerance: Patient tolerated treatment well Patient left: in bed;with call bell/phone within reach;with bed alarm set;with family/visitor present  OT Visit Diagnosis: Unsteadiness on feet (R26.81);Other (comment) (decreased activity tolerance)                Time: 8366-8347 OT Time Calculation (min): 19 min Charges:  OT General Charges $OT Visit: 1 Visit OT Evaluation $OT Eval Low Complexity: 1 Low  Margarie Rockey HERO., OTR/L, MA Acute Rehab [phone]   Margarie FORBES Horns 02/08/2024, 5:14 PM

## 2024-02-08 NOTE — Progress Notes (Signed)
 [facility] PROGRESS NOTE   [name] FMW:[id number] DOB: Jul 04, 1940 DOA: 02/06/2024  PCP: Lenon Layman ORN, MD  Brief History: 83 y.o. female with significant past medical history of TTP, followed at [facility], COPD, bacterial endocarditis previously, chronic kidney disease stage IIIa, essential hypertension presented with altered mental status, weakness and fever up to 202.9 F.  Patient was hospitalized.  She was hypertensive so she was admitted to the intensive care unit.  She was stabilized and then transferred to the hospitalist service.    Consultants: Critical care medicine  Procedures: None    Subjective/Interval History: Patient mentions that she feels much better this morning.  Occasional cough.  Smokes 2 packs of cigarettes on a daily basis.  Denies any shortness of breath at rest.  Denies any nausea vomiting.  No abdominal pain.  No chest pain.    Assessment/Plan:  Acute metabolic encephalopathy Possibly due to dehydration and hypovolemia and shock.  Now improved and back to baseline.  Hypovolemic shock Was fluid resuscitated and was on pressors briefly.  Has improved.  Blood pressure stable this morning. Patient was also placed on vancomycin  and Zosyn .  Currently only on Zosyn .  Await culture data. Procalcitonin was 0.34.  Lactic acid level is normal.  WBC is normal.  She is noted to be afebrile. UA did not suggest infection.  Chest x-ray did not reveal any abnormalities either.  Acute respiratory failure with hypoxia/history of COPD/tobacco abuse Does not use oxygen at home.  He is a heavy smoker.  Has a diagnosis of COPD.  Chest x-ray did not show any acute findings.  Try to wean her off.  However with her underlying COPD and heavy tobacco abuse she will likely end up requiring home oxygen. Noted to be wheezing this morning.  She is noted to be on Yupelri.  Will also give her budesonide.  DuoNebs will be ordered as well.  Nicotine  patch.  Acute  kidney injury Creatinine was 1.52 at admission.  Improved to 1.23.  BUN is noted to be normal.  Monitor urine output.  Avoid nephrotoxic agents.  History of TTP Followed at [facility].  Thrombocytopenia is noted.  LDH however was normal.  No evidence for acute TTP currently.  Initially consideration was being given to transferring her to [facility] however she appears to be stable. Care everywhere was reviewed.  Platelet count was 116,000 on October 6. Drop in platelet count could be due to acute illness.  Continue to monitor daily for now.  Hypomagnesemia Corrected.  Skin abrasion left lower extremity Yellowish exudate noted.  Neosporin will be ordered.  Covered with dressing.  Mood disorder/anxiety and depression Continue her psychotropic medications.  She is noted to be on Seroquel , fluoxetine  and gabapentin .   DVT Prophylaxis: Will discontinue the Lovenox  and place her on SCDs Code Status: Full code Family Communication: Discussed with patient.  No family at bedside Disposition Plan: Hopefully return home when improved    Medications: Scheduled:  Chlorhexidine  Gluconate Cloth  6 each Topical Daily   enoxaparin  (LOVENOX ) injection  40 mg Subcutaneous Q24H   FLUoxetine   20 mg Oral Daily   folic acid   1 mg Oral Daily   gabapentin   300 mg Oral QHS   hydrocortisone cream  1 Application Topical BID   latanoprost   1 drop Both Eyes QHS   multivitamin with minerals  1 tablet Oral Daily   QUEtiapine   25 mg Oral QHS   revefenacin  175 mcg Inhalation Daily   rosuvastatin   20 mg Oral Daily   Continuous:  piperacillin -tazobactam (ZOSYN )  IV 12.5 mL/hr at 02/08/24 0700   PRN:docusate sodium , hydrOXYzine, mouth rinse, polyethylene glycol, traZODone  Antibiotics: Anti-infectives (From admission, onward)    Start     Dose/Rate Route Frequency Ordered Stop   02/08/24 0600  vancomycin  (VANCOCIN ) IVPB 1000 mg/200 mL premix  Status:  Discontinued        1,000 mg 200 mL/hr over 60 Minutes  Intravenous Every 48 hours 02/06/24 0954 02/07/24 1013   02/07/24 0600  vancomycin  (VANCOCIN ) IVPB 1000 mg/200 mL premix  Status:  Discontinued        1,000 mg 200 mL/hr over 60 Minutes Intravenous Every 48 hours 02/06/24 0950 02/06/24 0954   02/06/24 1000  piperacillin -tazobactam (ZOSYN ) IVPB 3.375 g        3.375 g 12.5 mL/hr over 240 Minutes Intravenous Every 8 hours 02/06/24 0947     02/06/24 0600  vancomycin  (VANCOREADY) IVPB 1500 mg/300 mL        1,500 mg 150 mL/hr over 120 Minutes Intravenous  Once 02/06/24 0549 02/06/24 0837   02/06/24 0600  ceFEPIme  (MAXIPIME ) 2 g in sodium chloride  0.9 % 100 mL IVPB        2 g 200 mL/hr over 30 Minutes Intravenous  Once 02/06/24 0549 02/06/24 0637       Objective:  Vital Signs  Vitals:   02/08/24 0530 02/08/24 0600 02/08/24 0630 02/08/24 0819  BP: (!) 149/46 (!) 136/48 (!) 144/44   Pulse: 76 75 74   Resp: 20 18 15    Temp:    99.5 F (37.5 C)  TempSrc:    Oral  SpO2: 97% 92% 91%   Weight:      Height:        Intake/Output Summary (Last 24 hours) at 02/08/2024 0903 Last data filed at 02/08/2024 0700 Gross per 24 hour  Intake 887.55 ml  Output 2050 ml  Net -1162.45 ml   Filed Weights   02/06/24 1315 02/07/24 0500 02/08/24 0500  Weight: 76.1 kg 78 kg 79.2 kg    General appearance: Awake alert.  In no distress Resp: Normal effort at rest.  Diffuse wheezing noted bilaterally.  Few crackles at the bases.  No rhonchi. Cardio: S1-S2 is normal regular.  No S3-S4.  No rubs murmurs or bruit GI: Abdomen is soft.  Nontender nondistended.  Bowel sounds are present normal.  No masses organomegaly Extremities: No edema.  Full range of motion of lower extremities. Neurologic: Alert and oriented x3.  No focal neurological deficits.    Lab Results:  Data Reviewed: I have personally reviewed following labs and reports of the imaging studies  CBC: Recent Labs  Lab 02/06/24 0425 02/06/24 1345 02/07/24 0833 02/07/24 1131 02/08/24 0332   WBC 7.4 7.3 8.6 8.7 7.6  NEUTROABS 4.7 4.1  --  5.2  --   HGB 15.9* 14.1 13.1 13.7 13.8  HCT 48.0* 43.7 39.4 41.0 41.4  MCV 93.4 95.0 93.6 93.2 93.9  PLT 101* 91* 78* 75* 75*    Basic Metabolic Panel: Recent Labs  Lab 02/06/24 0425 02/07/24 0833 02/08/24 0332  NA 137 137 142  K 4.2 4.0 4.5  CL 106 108 103  CO2 24 20* 29  GLUCOSE 114* 162* 146*  BUN 16 14 10   CREATININE 1.52* 1.06* 1.23*  CALCIUM  8.7* 8.1* 8.2*  MG  --  1.6* 2.2  PHOS  --  2.7 3.3    GFR: Estimated Creatinine Clearance: 36.5 mL/min (A) (by  C-G formula based on SCr of 1.23 mg/dL (H)).  Liver Function Tests: Recent Labs  Lab 02/06/24 0425  AST 21  ALT 14  ALKPHOS 38  BILITOT 1.1  PROT 5.6*  ALBUMIN 3.5    CBG: Recent Labs  Lab 02/06/24 1516  GLUCAP 123*     Recent Results (from the past 240 hours)  Blood culture (routine x 2)     Status: None (Preliminary result)   Collection Time: 02/06/24  4:14 AM   Specimen: BLOOD  Result Value Ref Range Status   Specimen Description BLOOD RIGHT ANTECUBITAL  Final   Special Requests   Final    BOTTLES DRAWN AEROBIC AND ANAEROBIC Blood Culture results may not be optimal due to an inadequate volume of blood received in culture bottles   Culture   Final    NO GROWTH 2 DAYS Performed at [facility], [address], [place], [place] [address]    Report Status PENDING  Incomplete  Resp panel by RT-PCR (RSV, Flu A&B, Covid) Anterior Nasal Swab     Status: None   Collection Time: 02/06/24  4:14 AM   Specimen: Anterior Nasal Swab  Result Value Ref Range Status   SARS Coronavirus 2 by RT PCR NEGATIVE NEGATIVE Final   Influenza A by PCR NEGATIVE NEGATIVE Final   Influenza B by PCR NEGATIVE NEGATIVE Final    Comment: (NOTE) The Xpert Xpress SARS-CoV-2/FLU/RSV plus assay is intended as an aid in the diagnosis of influenza from Nasopharyngeal swab specimens and should not be used as a sole basis for treatment. Nasal washings and aspirates are  unacceptable for Xpert Xpress SARS-CoV-2/FLU/RSV testing.  Fact Sheet for Patients: BloggerCourse.com  Fact Sheet for Healthcare Providers: SeriousBroker.it  This test is not yet approved or cleared by the [place]  FDA and has been authorized for detection and/or diagnosis of SARS-CoV-2 by FDA under an Emergency Use Authorization (EUA). This EUA will remain in effect (meaning this test can be used) for the duration of the COVID-19 declaration under Section 564(b)(1) of the Act, 21 U.S.C. section 360bbb-3(b)(1), unless the authorization is terminated or revoked.     Resp Syncytial Virus by PCR NEGATIVE NEGATIVE Final    Comment: (NOTE) Fact Sheet for Patients: BloggerCourse.com  Fact Sheet for Healthcare Providers: SeriousBroker.it  This test is not yet approved or cleared by the [place]  FDA and has been authorized for detection and/or diagnosis of SARS-CoV-2 by FDA under an Emergency Use Authorization (EUA). This EUA will remain in effect (meaning this test can be used) for the duration of the COVID-19 declaration under Section 564(b)(1) of the Act, 21 U.S.C. section 360bbb-3(b)(1), unless the authorization is terminated or revoked.  Performed at [facility], [address] [place], [place] [address]   Blood culture (routine x 2)     Status: None (Preliminary result)   Collection Time: 02/06/24  4:19 AM   Specimen: BLOOD  Result Value Ref Range Status   Specimen Description BLOOD LEFT ANTECUBITAL  Final   Special Requests   Final    BOTTLES DRAWN AEROBIC AND ANAEROBIC Blood Culture adequate volume   Culture   Final    NO GROWTH 2 DAYS Performed at [facility], [address], [place], [place] [address]    Report Status PENDING  Incomplete  MRSA Next Gen by PCR, Nasal     Status: None   Collection Time: 02/06/24  9:14 AM   Specimen: Nasal  Mucosa; Nasal Swab  Result Value Ref Range Status   MRSA by PCR Next Gen NOT DETECTED NOT DETECTED Final    Comment: (NOTE) The GeneXpert MRSA Assay (FDA approved for NASAL specimens only), is one component of a comprehensive MRSA colonization surveillance program. It is not intended to diagnose MRSA infection nor to guide or monitor treatment for MRSA infections. Test performance is not FDA approved in patients less than 83 years old. Performed at [facility], [address] [place], [place] [address]       Radiology Studies: No results found.     LOS: 2 days   Peterson Mathey Verdene  [facility] Pager on [email or web address]  02/08/2024, 9:03 AM

## 2024-02-08 NOTE — Progress Notes (Signed)
 SATURATION QUALIFICATIONS: (This note is used to comply with regulatory documentation for home oxygen)  Patient Saturations on Room Air at Rest = 88%  Patient Saturations on Room Air while Ambulating = 83%  Patient Saturations on 2 Liters of oxygen while Ambulating = 90%  Please briefly explain why patient needs home oxygen:  Patient with hypoxia ambulating on RA.   Micheline Portal, PT Acute Rehabilitation Services [phone] 02/08/2024

## 2024-02-08 NOTE — Evaluation (Signed)
 Physical Therapy Evaluation Patient Details Name: [name] MRN: [id number] DOB: [birth year] Today's Date: 02/08/2024  History of Present Illness  Patient is an 83 y/o female admitted 02/06/24 with weakness and fever with hypotension and concern for sepsis. PMH positive for TTP (indwelling port B chest), COPD, tobacco use, CKD 3a, chronic back pain, HTN, CVA.  Clinical Impression  Patient presents with decreased mobility due to generalized weakness and decreased cardiorespiratory endurance now needing O2 to maintain SpO2 (see qualification for home O2 note).  Normally independent with mobility with walker or cane in the home and managing flight of steps to bed/bath.  States daughter comes weekly to help with shower.  Patient able to ambulate today with S to CGA with RW.  Feel she will benefit from continued skilled PT in the acute setting and likely not need follow up PT at d/c.         If plan is discharge home, recommend the following: A little help with walking and/or transfers;Help with stairs or ramp for entrance;A little help with bathing/dressing/bathroom   Can travel by private vehicle        Equipment Recommendations None recommended by PT  Recommendations for Other Services       Functional Status Assessment Patient has had a recent decline in their functional status and demonstrates the ability to make significant improvements in function in a reasonable and predictable amount of time.     Precautions / Restrictions Precautions Precautions: Fall Precaution/Restrictions Comments: reports fall prior to admission due to sepsis      Mobility  Bed Mobility Overal bed mobility: Needs Assistance Bed Mobility: Sit to Supine       Sit to supine: Supervision   General bed mobility comments: assist for lines    Transfers Overall transfer level: Needs assistance Equipment used: Rolling walker (2 wheels) Transfers: Sit to/from Stand Sit to Stand: Contact guard  assist           General transfer comment: for safety with lines    Ambulation/Gait Ambulation/Gait assistance: Supervision, Contact guard assist Gait Distance (Feet): 150 Feet Assistive device: Rolling walker (2 wheels) Gait Pattern/deviations: Step-through pattern, Decreased stride length       General Gait Details: assist on turns with lines around obstacles on unit  Stairs            Wheelchair Mobility     Tilt Bed    Modified Rankin (Stroke Patients Only)       Balance Overall balance assessment: Needs assistance   Sitting balance-Leahy Scale: Good     Standing balance support: Bilateral upper extremity supported Standing balance-Leahy Scale: Poor Standing balance comment: UE support for balance                             Pertinent Vitals/Pain Pain Assessment Pain Assessment: Faces Faces Pain Scale: Hurts little more Pain Location: L knee with abrasion Pain Descriptors / Indicators: Other (Comment) (stinging) Pain Intervention(s): Repositioned, Other (comment) (RN to apply dressing)    Home Living Family/patient expects to be discharged to:: Private residence Living Arrangements: Spouse/significant other Available Help at Discharge: Family Type of Home: House       Alternate Level Stairs-Number of Steps: 15 Home Layout: Two level Home Equipment: Cane - single point;BSC/3in1;Rollator (4 wheels);Shower seat;Grab bars - tub/shower Additional Comments: has another smaller walker upstairs    Prior Function Prior Level of Function : Independent/Modified Independent;Needs assist  Mobility Comments: rollator or cane in home ADLs Comments: daughter assists with shower once a week, spouse does all IADL's     Extremity/Trunk Assessment   Upper Extremity Assessment Upper Extremity Assessment: Overall WFL for tasks assessed    Lower Extremity Assessment Lower Extremity Assessment: Generalized weakness     Cervical / Trunk Assessment Cervical / Trunk Assessment: Normal  Communication   Communication Communication: No apparent difficulties    Cognition Arousal: Alert Behavior During Therapy: WFL for tasks assessed/performed   PT - Cognitive impairments: No apparent impairments                         Following commands: Intact       Cueing       General Comments General comments (skin integrity, edema, etc.): SpO2 drop on RA with ambulation, see O2 saturation for home O2 note. BP after in supine 130/51.  Spouse in the room and supportive    Exercises     Assessment/Plan    PT Assessment Patient needs continued PT services  PT Problem List Decreased strength;Decreased balance;Decreased mobility;Cardiopulmonary status limiting activity       PT Treatment Interventions DME instruction;Gait training;Stair training;Functional mobility training;Therapeutic activities;Therapeutic exercise;Balance training;Patient/family education    PT Goals (Current goals can be found in the Care Plan section)  Acute Rehab PT Goals Patient Stated Goal: return home PT Goal Formulation: With patient/family Time For Goal Achievement: 02/22/24 Potential to Achieve Goals: Good    Frequency Min 2X/week     Co-evaluation               AM-PAC PT 6 Clicks Mobility  Outcome Measure Help needed turning from your back to your side while in a flat bed without using bedrails?: None Help needed moving from lying on your back to sitting on the side of a flat bed without using bedrails?: None Help needed moving to and from a bed to a chair (including a wheelchair)?: None Help needed standing up from a chair using your arms (e.g., wheelchair or bedside chair)?: A Little Help needed to walk in hospital room?: A Little Help needed climbing 3-5 steps with a railing? : A Little 6 Click Score: 21    End of Session Equipment Utilized During Treatment: Gait belt;Oxygen Activity  Tolerance: Patient tolerated treatment well Patient left: in bed;with call bell/phone within reach;with family/visitor present   PT Visit Diagnosis: Muscle weakness (generalized) (M62.81);History of falling (Z91.81)    Time: 1010-1035 PT Time Calculation (min) (ACUTE ONLY): 25 min   Charges:   PT Evaluation $PT Eval Moderate Complexity: 1 Mod PT Treatments $Gait Training: 8-22 mins PT General Charges $$ ACUTE PT VISIT: 1 Visit         Micheline Portal, PT Acute Rehabilitation Services Office:[phone] 02/08/2024   Montie Portal 02/08/2024, 10:44 AM

## 2024-02-08 NOTE — Plan of Care (Signed)

## 2024-02-08 NOTE — TOC Initial Note (Signed)
 Transition of Care [facility]) - Initial/Assessment Note    Patient Details  Name: [name] MRN: [id number] Date of Birth: 05-17-1940  Transition of Care [facility]) CM/SW Contact:    Lauraine FORBES Saa, LCSWA Phone Number: 02/08/2024, 11:41 AM  Clinical Narrative:                  11:41 AM Per progressions, patient is anticipated to transfer out of MICU to telemetry unit today. Per chart review, PT did not have discharge recommendations for patient. TOC will continue to follow.  Expected Discharge Plan: Home/Self Care Barriers to Discharge: Continued Medical Work up   Patient Goals and CMS Choice            Expected Discharge Plan and Services   Discharge Planning Services: CM Consult   Living arrangements for the past 2 months: Single Family Home                                      Prior Living Arrangements/Services Living arrangements for the past 2 months: Single Family Home Lives with:: Spouse, Adult Children Patient language and need for interpreter reviewed:: Yes        Need for Family Participation in Patient Care: No (Comment) Care giver support system in place?: Yes (comment) Current home services: DME Criminal Activity/Legal Involvement Pertinent to Current Situation/Hospitalization: No - Comment as needed  Activities of Daily Living   ADL Screening (condition at time of admission) Independently performs ADLs?: No Does the patient have a NEW difficulty with bathing/dressing/toileting/self-feeding that is expected to last >3 days?: No Does the patient have a NEW difficulty with getting in/out of bed, walking, or climbing stairs that is expected to last >3 days?: No Does the patient have a NEW difficulty with communication that is expected to last >3 days?: No Is the patient deaf or have difficulty hearing?: Yes Does the patient have difficulty seeing, even when wearing glasses/contacts?: No Does the patient have difficulty concentrating,  remembering, or making decisions?: No  Permission Sought/Granted Permission sought to share information with : Family Supports Permission granted to share information with : No (Contact information on chart)  Share Information with NAME: Sible Straley     Permission granted to share info w Relationship: Spouse  Permission granted to share info w Contact Information: [phone]  Emotional Assessment       Orientation: : Oriented to Place, Oriented to  Time, Oriented to Situation, Oriented to Self Alcohol  / Substance Use: Not Applicable Psych Involvement: No (comment)  Admission diagnosis:  Shock (HCC) [R57.9] Sepsis, due to unspecified organism, unspecified whether acute organ dysfunction present [facility]) [A41.9] Patient Active Problem List   Diagnosis Date Noted   Shock (HCC) 02/06/2024   Anaphylactic reaction , suspect shellfish (shrimp), initial encounter 10/09/2021   Anaphylactic shock 10/09/2021   Chronic kidney disease, stage 3a (HCC) 10/09/2021   Chronic back pain 10/09/2021   Essential hypertension 10/09/2021   Acute respiratory failure with hypoxia (HCC) 10/09/2021   Elevated troponin 10/09/2021   Anaphylactic reaction 10/09/2021   Acute left-sided weakness 06/20/2021   AKI (acute kidney injury) 06/20/2021   T.T.P. 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 [place] [address] Phone: [phone] Fax: [phone]  [facility] [address] [place] [address] Phone: [phone] Fax: [phone]     Social Drivers of Health (SDOH) Social History: SDOH Screenings   Food Insecurity: No Food Insecurity (02/06/2024)  Housing: Low Risk  (02/06/2024)  Transportation Needs: No Transportation Needs (02/06/2024)  Utilities: Not At Risk (02/06/2024)  Depression (PHQ2-9): Low Risk  (07/24/2018)  Financial Resource Strain: Low Risk  (07/14/2022)   Received from [facility]  Social Connections: Moderately Isolated (02/06/2024)  Tobacco Use: High Risk (02/06/2024)   SDOH Interventions:     Readmission Risk Interventions     No data to display

## 2024-02-09 ENCOUNTER — Other Ambulatory Visit (HOSPITAL_COMMUNITY): Payer: Self-pay

## 2024-02-09 DIAGNOSIS — F39 Unspecified mood [affective] disorder: Secondary | ICD-10-CM

## 2024-02-09 DIAGNOSIS — E861 Hypovolemia: Secondary | ICD-10-CM

## 2024-02-09 DIAGNOSIS — R579 Shock, unspecified: Secondary | ICD-10-CM

## 2024-02-09 LAB — BASIC METABOLIC PANEL WITH GFR
Anion gap: 9 (ref 5–15)
BUN: 9 mg/dL (ref 8–23)
CO2: 27 mmol/L (ref 22–32)
Calcium: 8.4 mg/dL — ABNORMAL LOW (ref 8.9–10.3)
Chloride: 105 mmol/L (ref 98–111)
Creatinine, Ser: 1.13 mg/dL — ABNORMAL HIGH (ref 0.44–1.00)
GFR, Estimated: 49 mL/min — ABNORMAL LOW (ref >60)
Glucose, Bld: 98 mg/dL (ref 70–99)
Potassium: 4.4 mmol/L (ref 3.5–5.1)
Sodium: 141 mmol/L (ref 135–145)

## 2024-02-09 LAB — CBC
HCT: 38.4 % (ref 36.0–46.0)
Hemoglobin: 12.7 g/dL (ref 12.0–15.0)
MCH: 30.7 pg (ref 26.0–34.0)
MCHC: 33.1 g/dL (ref 30.0–36.0)
MCV: 92.8 fL (ref 80.0–100.0)
Platelets: 86 K/uL — ABNORMAL LOW (ref 150–400)
RBC: 4.14 MIL/uL (ref 3.87–5.11)
RDW: 13.4 % (ref 11.5–15.5)
WBC: 8.4 K/uL (ref 4.0–10.5)
nRBC: 0 % (ref 0.0–0.2)

## 2024-02-09 LAB — HEPATIC FUNCTION PANEL
ALT: 19 U/L (ref 0–44)
AST: 23 U/L (ref 15–41)
Albumin: 2.6 g/dL — ABNORMAL LOW (ref 3.5–5.0)
Alkaline Phosphatase: 40 U/L (ref 38–126)
Bilirubin, Direct: 0.2 mg/dL (ref 0.0–0.2)
Indirect Bilirubin: 0.8 mg/dL (ref 0.3–0.9)
Total Bilirubin: 1 mg/dL (ref 0.0–1.2)
Total Protein: 5 g/dL — ABNORMAL LOW (ref 6.5–8.1)

## 2024-02-09 LAB — ADAMTS13 ANTIBODY: ADAMTS13 Antibody: 4 U/mL (ref <12)

## 2024-02-09 LAB — MAGNESIUM: Magnesium: 2.1 mg/dL (ref 1.7–2.4)

## 2024-02-09 LAB — PHOSPHORUS: Phosphorus: 2.8 mg/dL (ref 2.5–4.6)

## 2024-02-09 MED ORDER — AMOXICILLIN 500 MG PO CAPS
500.0000 mg | ORAL_CAPSULE | Freq: Three times a day (TID) | ORAL | 0 refills | Status: AC
  Filled 2024-02-09: qty 6, 2d supply, fill #0

## 2024-02-09 MED ORDER — BUDESONIDE-FORMOTEROL FUMARATE 160-4.5 MCG/ACT IN AERO
2.0000 | INHALATION_SPRAY | Freq: Two times a day (BID) | RESPIRATORY_TRACT | 12 refills | Status: AC
  Filled 2024-02-09: qty 10.2, 30d supply, fill #0

## 2024-02-09 MED ORDER — ALBUTEROL SULFATE HFA 108 (90 BASE) MCG/ACT IN AERS
2.0000 | INHALATION_SPRAY | Freq: Four times a day (QID) | RESPIRATORY_TRACT | 0 refills | Status: AC | PRN
  Filled 2024-02-09: qty 6.7, 25d supply, fill #0

## 2024-02-09 NOTE — Progress Notes (Signed)
 PT Cancellation Note  Patient Details Name: [name] MRN: [id number] DOB: 1941-01-15   Cancelled Treatment:    Reason Eval/Treat Not Completed: Other (comment). Pt discharging home today. Declining PT. Reports no concerns regarding home mobility or new need for home O2.    Erven Sari Shaker 02/09/2024, 9:40 AM

## 2024-02-09 NOTE — Discharge Summary (Signed)
 PATIENT DETAILS Name: [name] Age: 83 y.o. Sex: female Date of Birth: January 16, 1941 MRN: [id number]. Admitting Physician: No admitting provider for patient encounter. ERE:[email or web address], Layman ORN, MD  Admit Date: 02/06/2024 Discharge date: 02/09/2024  Recommendations for Outpatient Follow-up:  Follow up with PCP in 1-2 weeks Please obtain CMP/CBC in one week Please follow up with hematology at [facility] [id number] activity-pending-please follow  Admitted From:  Home  Disposition: Home   Discharge Condition: fair  CODE STATUS:   Code Status: Full Code   Diet recommendation:  Diet Order             Diet - low sodium heart healthy           Diet Heart Room service appropriate? 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  She was initially hypotensive-evaluated by PCCM and admitted to the ICU-briefly required pressors.  Once stabilized-transferred to [facility] on 10/9.  Significant studies 10/7>> CXR: No PNA 10/7>> blood cultures: No growth 10/7>> COVID/influenza/RSV PCR: Negative  Brief Hospital Course: Shock In retrospect-per chart review-felt to be a combination of hypovolemic and septic shock Managed with IV fluids/pressors while in the ICU-and broad-spectrum antibiotics Sepsis physiology has resolved-blood pressure now stable for the past several days.  Acute metabolic encephalopathy Likely in the setting of fever/sepsis Clinically improved-completely awake and alert-this morning-and requesting that she be discharged home.  Fever/sepsis Unclear foci of infection-none apparent on exam UA negative for UTI Blood cultures negative Benign abdominal exam Chest x-ray without any  pneumonia Clinically stable/improved for the past several days-empirically started on Zosyn  while in the ICU. She is requesting discharge today-as she feels much better and claims she is back to her baseline.  Will place on oral abx for a few more days  AKI Hemodynamically mediated in the setting of shock Creatinine has improved-almost back to baseline Recheck electrolytes in PCPs office in 1 week.  Acute hypoxemic respiratory failure Had some URI-like symptoms but lungs are clear to exam this morning In retrospect-suspect some of her hypoxemia may be chronic from underlying COPD (ongoing 2 pack/day cigarette use) Qualifies for home O2-I have ordered Continue bronchodilator regimen on discharge Follow-up with PCP/pulmonology for inhaler regimen optimization.  History of TTP Mild thrombocytopenia which is improving.  LDH normal Per review of notes from [facility] health-patient has been off all immunosuppressive since April 2025 Follow-up with hematology at [facility].  Mood disorder/anxiety/depression She is stable-mood appears appropriate this morning-she is excited about going home Continue Seroquel /fluoxetine /Neurontin .  Tobacco abuse Counseled but I do not think she has any inclination to quit at this point. She is aware that she should not be smoking while using oxygen.  Note-updated daughter/spouse over the phone on day of discharge.   Discharge Diagnoses:  Principal Problem:   Shock [facility])   Discharge Instructions:  Activity:  As tolerated with Full fall precautions use walker/cane & assistance as needed Discharge Instructions     Call MD for:  extreme fatigue   Complete by: As directed    Call MD for:  persistant dizziness or light-headedness   Complete by: As directed    Call MD for:  redness, tenderness, or signs of infection (pain, swelling, redness, odor  or green/yellow discharge around incision site)   Complete by: As directed    Diet - low sodium  heart healthy   Complete by: As directed    Discharge instructions   Complete by: As directed    Follow with Primary MD  Lenon Layman ORN, MD in 1-2 weeks  Please get a complete blood count and chemistry panel checked by your Primary MD at your next visit, and again as instructed by your Primary MD.  Get Medicines reviewed and adjusted: Please take all your medications with you for your next visit with your Primary MD  Laboratory/radiological data: Please request your Primary MD to go over all hospital tests and procedure/radiological results at the follow up, please ask your Primary MD to get all Hospital records sent to his/her office.  In some cases, they will be blood work, cultures and biopsy results pending at the time of your discharge. Please request that your primary care M.D. follows up on these results.  Also Note the following: If you experience worsening of your admission symptoms, develop shortness of breath, life threatening emergency, suicidal or homicidal thoughts you must seek medical attention immediately by calling 911 or calling your MD immediately  if symptoms less severe.  You must read complete instructions/literature along with all the possible adverse reactions/side effects for all the Medicines you take and that have been prescribed to you. Take any new Medicines after you have completely understood and accpet all the possible adverse reactions/side effects.   Do not drive when taking Pain medications or sleeping medications (Benzodaizepines)  Do not take more than prescribed Pain, Sleep and Anxiety Medications. It is not advisable to combine anxiety,sleep and pain medications without talking with your primary care practitioner  Special Instructions: If you have smoked or chewed Tobacco  in the last 2 yrs please stop smoking, stop any regular Alcohol   and or any Recreational drug use.  Wear Seat belts while driving.  Please note: You were cared for by a  hospitalist during your hospital stay. Once you are discharged, your primary care physician will handle any further medical issues. Please note that NO REFILLS for any discharge medications will be authorized once you are discharged, as it is imperative that you return to your primary care physician (or establish a relationship with a primary care physician if you do not have one) for your post hospital discharge needs so that they can reassess your need for medications and monitor your lab values.   Increase activity slowly   Complete by: As directed       Allergies as of 02/09/2024       Reactions   Adhesive [tape] Other (See Comments)   Blisters, Band-Aids brand rips off the skin- paper tape only!!   Cefuroxime  Axetil Anxiety, Other (See Comments)   Made the patient jittery   Cefuroxime  Axetil Anxiety, Other (See Comments)   Shaky, amoxicillin ok Made the patient jittery   Pedi-pre Tape Spray [wound Dressing Adhesive] Other (See Comments)   Blisters, band-aide brand  Blisters, Band-Aids brand rips off the skin- paper tape only!!   Shellfish Allergy Anaphylaxis   Sulfa Antibiotics Anaphylaxis   Sulfa Drugs Cross Reactors Anaphylaxis   Other Hives, Other (See Comments)   Wool: Reaction is hives Advertising account executive tape: Reaction is blistering   Amoxicillin-pot Clavulanate         Medication List     TAKE these medications    albuterol  108 (90 Base) MCG/ACT inhaler Commonly  known as: VENTOLIN  HFA Inhale 2 puffs into the lungs every 6 (six) hours as needed for wheezing or shortness of breath.   amoxicillin 500 MG capsule Commonly known as: AMOXIL Take 1 capsule (500 mg total) by mouth 3 (three) times daily for 2 days.   artificial tears ophthalmic solution Place 1 drop into both eyes as needed for dry eyes.   ascorbic acid  500 MG tablet Commonly known as: VITAMIN C Take 500 mg by mouth daily.   budesonide-formoterol 160-4.5 MCG/ACT inhaler Commonly known as:  Symbicort Inhale 2 puffs into the lungs 2 (two) times daily.   cholecalciferol  10 MCG (400 UNIT) Tabs tablet Commonly known as: VITAMIN D3 Take 400 Units by mouth daily.   cyanocobalamin  1000 MCG tablet Take 1,000 mcg by mouth daily.   diphenhydrAMINE  25 MG tablet Commonly known as: BENADRYL  Take 25 mg by mouth daily.   FLUoxetine  20 MG capsule Commonly known as: PROZAC  Take 20 mg by mouth daily.   folic acid  1 MG tablet Commonly known as: FOLVITE  Take 1 mg by mouth in the morning and at bedtime.   gabapentin  600 MG tablet Commonly known as: NEURONTIN  Take 1 tablet (600 mg total) by mouth 3 (three) times daily. What changed:  how much to take when to take this   latanoprost  0.005 % ophthalmic solution Commonly known as: XALATAN  Place 1 drop into both eyes.   multivitamin tablet Take 1 tablet by mouth daily.   QUEtiapine  25 MG tablet Commonly known as: SEROQUEL  Take 25 mg by mouth at bedtime.   rosuvastatin  20 MG tablet Commonly known as: CRESTOR  Take 20 mg by mouth daily.   tiotropium 18 MCG inhalation capsule Commonly known as: SPIRIVA  Place 18 mcg into inhaler and inhale daily.   traMADol  50 MG tablet Commonly known as: ULTRAM  Take 1 tablet (50 mg total) by mouth every 6 (six) hours as needed for moderate pain.   traZODone 50 MG tablet Commonly known as: DESYREL Take 50-100 mg by mouth at bedtime as needed for sleep.               Durable Medical Equipment  (From admission, onward)           Start     Ordered   02/09/24 0627  For home use only DME oxygen  Once       Question Answer Comment  Length of Need 6 Months   Liters per Minute 2   Oxygen conserving device Yes   Oxygen delivery system Gas      02/09/24 0626            Follow-up Information     Lenon Layman ORN, MD. Schedule an appointment as soon as possible for a visit in 1 week(s).   Specialty: Internal Medicine Contact information: [address] [facility] [place] [address] [phone]                Allergies  Allergen Reactions   Adhesive [Tape] Other (See Comments)    Blisters, Band-Aids brand rips off the skin- paper tape only!!   Cefuroxime  Axetil Anxiety and Other (See Comments)    Made the patient jittery   Cefuroxime  Axetil Anxiety and Other (See Comments)    Shaky, amoxicillin ok Made the patient jittery   Pedi-Pre Tape Spray [Wound Dressing Adhesive] Other (See Comments)    Blisters, band-aide brand  Blisters, Band-Aids brand rips off the skin- paper tape only!!   Shellfish Allergy  Anaphylaxis   Sulfa Antibiotics Anaphylaxis   Sulfa Drugs Cross Reactors Anaphylaxis   Other Hives and Other (See Comments)    Wool: Reaction is hives Advertising account executive tape: Reaction is blistering    Amoxicillin-Pot Clavulanate      Other Procedures/Studies: DG Chest Port 1 View Result Date: 02/06/2024 EXAM: AP portable semi-upright view of the chest. 02/06/2024 04:51 AM COMPARISON: Portable chest radiograph 09/11/2023 and earlier. CLINICAL HISTORY: 83 year old female with fever, sepsis, decreased mobility, history of TTP, and COPD. FINDINGS: LINES, TUBES AND DEVICES: Stable chronic bilateral chest ports, catheters. LUNGS AND PLEURA: Lung volumes are stable. No focal pulmonary opacity. No pulmonary edema. No pleural effusion. No pneumothorax. HEART AND MEDIASTINUM: Mediastinal contours are stable. No acute abnormality of the cardiac silhouette. BONES AND SOFT TISSUES: No acute osseous abnormality. IMPRESSION: 1. No acute cardiopulmonary abnormality. Electronically signed by: Helayne Hurst MD 02/06/2024 05:37 AM EDT RP Workstation: [id number]     TODAY-DAY OF DISCHARGE:  Subjective:   [name] today has no headache,no chest abdominal pain,no new weakness tingling or numbness, feels much better wants to go home today.   Objective:   Blood pressure (!) 104/56, pulse 71, temperature 98.8 F (37.1  C), temperature source Oral, resp. rate 20, height 5' 4 (1.626 m), weight 79.2 kg, SpO2 92%.  Intake/Output Summary (Last 24 hours) at 02/09/2024 0835 Last data filed at 02/08/2024 1200 Gross per 24 hour  Intake 386.61 ml  Output --  Net 386.61 ml   Filed Weights   02/06/24 1315 02/07/24 0500 02/08/24 0500  Weight: 76.1 kg 78 kg 79.2 kg    Exam: Awake Alert, Oriented *3, No new F.N deficits, Normal affect .AT,PERRAL Supple Neck,No JVD, No cervical lymphadenopathy appriciated.  Symmetrical Chest wall movement, Good air movement bilaterally, CTAB RRR,No Gallops,Rubs or new Murmurs, No Parasternal Heave +ve B.Sounds, Abd Soft, Non tender, No organomegaly appriciated, No rebound -guarding or rigidity. No Cyanosis, Clubbing or edema, No new Rash or bruise   PERTINENT RADIOLOGIC STUDIES: No results found.   PERTINENT LAB RESULTS: CBC: Recent Labs    02/08/24 0332 02/09/24 0252  WBC 7.6 8.4  HGB 13.8 12.7  HCT 41.4 38.4  PLT 75* 86*   CMET CMP     Component Value Date/Time   NA 141 02/09/2024 0252   NA 143 07/17/2018 1134   NA 140 07/26/2013 1020   K 4.4 02/09/2024 0252   K 4.6 07/26/2013 1020   CL 105 02/09/2024 0252   CL 108 (H) 09/13/2012 1702   CL 107 05/22/2012 1023   CO2 27 02/09/2024 0252   CO2 22 07/26/2013 1020   GLUCOSE 98 02/09/2024 0252   GLUCOSE 110 07/26/2013 1020   GLUCOSE 108 (H) 05/22/2012 1023   BUN 9 02/09/2024 0252   BUN 19 07/17/2018 1134   BUN 35.1 (H) 07/26/2013 1020   CREATININE 1.13 (H) 02/09/2024 0252   CREATININE 1.30 (H) 08/25/2021 0953   CREATININE 1.07 07/22/2014 1113   CREATININE 0.9 07/26/2013 1020   CALCIUM  8.4 (L) 02/09/2024 0252   CALCIUM  8.7 07/26/2013 1020   PROT 5.0 (L) 02/09/2024 0252   PROT 6.9 07/17/2018 1134   PROT 5.8 (L) 07/26/2013 1020   ALBUMIN 2.6 (L) 02/09/2024 0252   ALBUMIN 4.3 07/17/2018 1134   ALBUMIN 3.4 (L) 07/26/2013 1020   AST 23 02/09/2024 0252   AST 24 08/25/2021 0953   AST 15 07/26/2013  1020   ALT 19 02/09/2024 0252   ALT 37 08/25/2021 0953  ALT 27 07/26/2013 1020   ALKPHOS 40 02/09/2024 0252   ALKPHOS 67 07/26/2013 1020   BILITOT 1.0 02/09/2024 0252   BILITOT 0.6 08/25/2021 0953   BILITOT 0.40 07/26/2013 1020   GFRNONAA 49 (L) 02/09/2024 0252   GFRNONAA 42 (L) 08/25/2021 0953   GFRNONAA 58 (L) 08/12/2013 1150    GFR Estimated Creatinine Clearance: 39.8 mL/min (A) (by C-G formula based on SCr of 1.13 mg/dL (H)). No results for input(s): LIPASE, AMYLASE in the last 72 hours. No results for input(s): CKTOTAL, CKMB, CKMBINDEX, TROPONINI in the last 72 hours. Invalid input(s): POCBNP No results for input(s): DDIMER in the last 72 hours. No results for input(s): HGBA1C in the last 72 hours. No results for input(s): CHOL, HDL, LDLCALC, TRIG, CHOLHDL, LDLDIRECT in the last 72 hours. No results for input(s): TSH, T4TOTAL, T3FREE, THYROIDAB in the last 72 hours.  Invalid input(s): FREET3 No results for input(s): VITAMINB12, FOLATE, FERRITIN, TIBC, IRON, RETICCTPCT in the last 72 hours. Coags: No results for input(s): INR in the last 72 hours.  Invalid input(s): PT Microbiology: Recent Results (from the past 240 hours)  Blood culture (routine x 2)     Status: None (Preliminary result)   Collection Time: 02/06/24  4:14 AM   Specimen: BLOOD  Result Value Ref Range Status   Specimen Description BLOOD RIGHT ANTECUBITAL  Final   Special Requests   Final    BOTTLES DRAWN AEROBIC AND ANAEROBIC Blood Culture results may not be optimal due to an inadequate volume of blood received in culture bottles   Culture   Final    NO GROWTH 2 DAYS Performed at [facility], [address], [place], [place] [address]    Report Status PENDING  Incomplete  Resp panel by RT-PCR (RSV, Flu A&B, Covid) Anterior Nasal Swab     Status: None   Collection Time: 02/06/24  4:14 AM   Specimen: Anterior Nasal Swab  Result Value Ref  Range Status   SARS Coronavirus 2 by RT PCR NEGATIVE NEGATIVE Final   Influenza A by PCR NEGATIVE NEGATIVE Final   Influenza B by PCR NEGATIVE NEGATIVE Final    Comment: (NOTE) The Xpert Xpress SARS-CoV-2/FLU/RSV plus assay is intended as an aid in the diagnosis of influenza from Nasopharyngeal swab specimens and should not be used as a sole basis for treatment. Nasal washings and aspirates are unacceptable for Xpert Xpress SARS-CoV-2/FLU/RSV testing.  Fact Sheet for Patients: BloggerCourse.com  Fact Sheet for Healthcare Providers: SeriousBroker.it  This test is not yet approved or cleared by the [place]  FDA and has been authorized for detection and/or diagnosis of SARS-CoV-2 by FDA under an Emergency Use Authorization (EUA). This EUA will remain in effect (meaning this test can be used) for the duration of the COVID-19 declaration under Section 564(b)(1) of the Act, 21 U.S.C. section 360bbb-3(b)(1), unless the authorization is terminated or revoked.     Resp Syncytial Virus by PCR NEGATIVE NEGATIVE Final    Comment: (NOTE) Fact Sheet for Patients: BloggerCourse.com  Fact Sheet for Healthcare Providers: SeriousBroker.it  This test is not yet approved or cleared by the [place]  FDA and has been authorized for detection and/or diagnosis of SARS-CoV-2 by FDA under an Emergency Use Authorization (EUA). This EUA will remain in effect (meaning this test can be used) for the duration of the COVID-19 declaration under Section 564(b)(1) of the Act, 21 U.S.C. section 360bbb-3(b)(1), unless the authorization is terminated or revoked.  Performed at [facility],  [address] [place], [place] [address]   Blood culture (routine x 2)     Status: None (Preliminary result)   Collection Time: 02/06/24  4:19 AM   Specimen: BLOOD  Result Value Ref Range Status   Specimen  Description BLOOD LEFT ANTECUBITAL  Final   Special Requests   Final    BOTTLES DRAWN AEROBIC AND ANAEROBIC Blood Culture adequate volume   Culture   Final    NO GROWTH 2 DAYS Performed at [facility], [address] [place], [place] [address]    Report Status PENDING  Incomplete  MRSA Next Gen by PCR, Nasal     Status: None   Collection Time: 02/06/24  9:14 AM   Specimen: Nasal Mucosa; Nasal Swab  Result Value Ref Range Status   MRSA by PCR Next Gen NOT DETECTED NOT DETECTED Final    Comment: (NOTE) The GeneXpert MRSA Assay (FDA approved for NASAL specimens only), is one component of a comprehensive MRSA colonization surveillance program. It is not intended to diagnose MRSA infection nor to guide or monitor treatment for MRSA infections. Test performance is not FDA approved in patients less than 16 years old. Performed at [facility], [address] [place], [place] [address]     FURTHER DISCHARGE INSTRUCTIONS:  Get Medicines reviewed and adjusted: Please take all your medications with you for your next visit with your Primary MD  Laboratory/radiological data: Please request your Primary MD to go over all hospital tests and procedure/radiological results at the follow up, please ask your Primary MD to get all Hospital records sent to his/her office.  In some cases, they will be blood work, cultures and biopsy results pending at the time of your discharge. Please request that your primary care M.D. goes through all the records of your hospital data and follows up on these results.  Also Note the following: If you experience worsening of your admission symptoms, develop shortness of breath, life threatening emergency, suicidal or homicidal thoughts you must seek medical attention immediately by calling 911 or calling your MD immediately  if symptoms less severe.  You must read complete instructions/literature along with all the possible adverse reactions/side  effects for all the Medicines you take and that have been prescribed to you. Take any new Medicines after you have completely understood and accpet all the possible adverse reactions/side effects.   Do not drive when taking Pain medications or sleeping medications (Benzodaizepines)  Do not take more than prescribed Pain, Sleep and Anxiety Medications. It is not advisable to combine anxiety,sleep and pain medications without talking with your primary care practitioner  Special Instructions: If you have smoked or chewed Tobacco  in the last 2 yrs please stop smoking, stop any regular Alcohol   and or any Recreational drug use.  Wear Seat belts while driving.  Please note: You were cared for by a hospitalist during your hospital stay. Once you are discharged, your primary care physician will handle any further medical issues. Please note that NO REFILLS for any discharge medications will be authorized once you are discharged, as it is imperative that you return to your primary care physician (or establish a relationship with a primary care physician if you do not have one) for your post hospital discharge needs so that they can reassess your need for medications and monitor your lab values.  Total Time spent coordinating discharge including counseling, education and face to face time equals greater than 30 minutes.  Signed:  Latrise Bowland 02/09/2024 8:35 AM

## 2024-02-09 NOTE — TOC Transition Note (Addendum)
 Transition of Care [facility]) - Discharge Note   Patient Details  Name: KENDEL PESNELL MRN: [id number] Date of Birth: [birth year]  [facility] CM/SW Contact:  Corean JAYSON Canary, RN Phone Number: 02/09/2024, 8:58 AM   Clinical Narrative:    The paitent is transitioning home today with oxygen.  [employer] called to service patient for a tank for DC, and to setup at home Paitent may go to  DC lounge, adapt aware [address] from [employer] that the patient and family declined oxygen.    Final next level of care: Home/Self Care Barriers to Discharge: No Barriers Identified   Patient Goals and CMS Choice            Discharge Placement                       Discharge Plan and Services Additional resources added to the After Visit Summary for     Discharge Planning Services: CM Consult            DME Arranged: Oxygen DME Agency: [employer] Date DME Agency Contacted: 02/09/24 Time DME Agency Contacted: [phone] Representative spoke with at DME Agency: [place]            Social Drivers of Health (SDOH) Interventions SDOH Screenings   Food Insecurity: No Food Insecurity (02/06/2024)  Housing: Low Risk  (02/06/2024)  Transportation Needs: No Transportation Needs (02/06/2024)  Utilities: Not At Risk (02/06/2024)  Depression (PHQ2-9): Low Risk  (07/24/2018)  Financial Resource Strain: Low Risk  (07/14/2022)   Received from [facility]  Social Connections: Moderately Isolated (02/06/2024)  Tobacco Use: High Risk (02/06/2024)     Readmission Risk Interventions     No data to display

## 2024-02-09 NOTE — Plan of Care (Signed)

## 2024-02-11 LAB — CULTURE, BLOOD (ROUTINE X 2)
Culture: NO GROWTH
Culture: NO GROWTH
Special Requests: ADEQUATE

## 2024-02-12 LAB — ADAMTS13 ACTIVITY: Adamts 13 Activity: <2 % (ref >66.8)
